# Patient Record
Sex: Male | Born: 1955
Health system: Southern US, Community
[De-identification: ages and names within clinical notes are randomized; demographics above are authoritative.]

## PROBLEM LIST (undated history)

## (undated) DIAGNOSIS — F32A Depression, unspecified: Secondary | ICD-10-CM

## (undated) DIAGNOSIS — J449 Chronic obstructive pulmonary disease, unspecified: Secondary | ICD-10-CM

## (undated) DIAGNOSIS — R51 Headache: Secondary | ICD-10-CM

## (undated) DIAGNOSIS — I639 Cerebral infarction, unspecified: Secondary | ICD-10-CM

## (undated) DIAGNOSIS — F17200 Nicotine dependence, unspecified, uncomplicated: Secondary | ICD-10-CM

## (undated) DIAGNOSIS — R519 Headache, unspecified: Secondary | ICD-10-CM

## (undated) DIAGNOSIS — J302 Other seasonal allergic rhinitis: Secondary | ICD-10-CM

## (undated) DIAGNOSIS — M25562 Pain in left knee: Secondary | ICD-10-CM

## (undated) DIAGNOSIS — N529 Male erectile dysfunction, unspecified: Secondary | ICD-10-CM

## (undated) HISTORY — PX: HERNIA REPAIR: SHX51

## (undated) HISTORY — PX: COLONOSCOPY: SHX174

## (undated) HISTORY — PX: BACK SURGERY: SHX140

---

## 2007-01-25 ENCOUNTER — Ambulatory Visit (HOSPITAL_COMMUNITY): Admission: RE | Admit: 2007-01-25 | Discharge: 2007-01-25 | Payer: Self-pay | Admitting: Neurological Surgery

## 2009-08-20 ENCOUNTER — Inpatient Hospital Stay (HOSPITAL_COMMUNITY): Admission: RE | Admit: 2009-08-20 | Discharge: 2009-08-22 | Payer: Self-pay | Admitting: Neurological Surgery

## 2009-09-15 ENCOUNTER — Encounter: Admission: RE | Admit: 2009-09-15 | Discharge: 2009-09-15 | Payer: Self-pay | Admitting: Neurological Surgery

## 2009-11-11 ENCOUNTER — Encounter: Admission: RE | Admit: 2009-11-11 | Discharge: 2009-11-11 | Payer: Self-pay | Admitting: Neurological Surgery

## 2010-02-09 ENCOUNTER — Encounter: Admission: RE | Admit: 2010-02-09 | Discharge: 2010-02-09 | Payer: Self-pay | Admitting: Neurological Surgery

## 2010-05-12 ENCOUNTER — Encounter: Admission: RE | Admit: 2010-05-12 | Discharge: 2010-05-12 | Payer: Self-pay | Admitting: Neurological Surgery

## 2010-08-17 ENCOUNTER — Encounter
Admission: RE | Admit: 2010-08-17 | Discharge: 2010-08-17 | Payer: Self-pay | Source: Home / Self Care | Attending: Neurological Surgery | Admitting: Neurological Surgery

## 2010-09-13 ENCOUNTER — Emergency Department: Payer: Self-pay | Admitting: Emergency Medicine

## 2010-12-01 ENCOUNTER — Other Ambulatory Visit: Payer: Self-pay | Admitting: Neurological Surgery

## 2010-12-01 ENCOUNTER — Ambulatory Visit
Admission: RE | Admit: 2010-12-01 | Discharge: 2010-12-01 | Disposition: A | Discharge status: Home / Self Care | Payer: Worker's Compensation | Source: Ambulatory Visit | Attending: Neurological Surgery | Admitting: Neurological Surgery

## 2010-12-01 DIAGNOSIS — M545 Low back pain, unspecified: Secondary | ICD-10-CM

## 2010-12-08 LAB — APTT: aPTT: 33 seconds (ref 24–37)

## 2010-12-08 LAB — CBC
HCT: 48.3 % (ref 39.0–52.0)
Hemoglobin: 16.6 g/dL (ref 13.0–17.0)
MCHC: 34.5 g/dL (ref 30.0–36.0)
MCV: 97.3 fL (ref 78.0–100.0)
Platelets: 274 10*3/uL (ref 150–400)
RBC: 4.97 MIL/uL (ref 4.22–5.81)
RDW: 13.2 % (ref 11.5–15.5)
WBC: 10.1 10*3/uL (ref 4.0–10.5)

## 2010-12-08 LAB — DIFFERENTIAL
Basophils Absolute: 0.1 10*3/uL (ref 0.0–0.1)
Basophils Relative: 1 % (ref 0–1)
Eosinophils Absolute: 0.4 10*3/uL (ref 0.0–0.7)
Eosinophils Relative: 4 % (ref 0–5)
Lymphocytes Relative: 34 % (ref 12–46)
Lymphs Abs: 3.4 10*3/uL (ref 0.7–4.0)
Monocytes Absolute: 0.5 10*3/uL (ref 0.1–1.0)
Monocytes Relative: 5 % (ref 3–12)
Neutro Abs: 5.7 10*3/uL (ref 1.7–7.7)
Neutrophils Relative %: 57 % (ref 43–77)

## 2010-12-08 LAB — BASIC METABOLIC PANEL
BUN: 12 mg/dL (ref 6–23)
CO2: 22 mEq/L (ref 19–32)
Calcium: 9.7 mg/dL (ref 8.4–10.5)
Chloride: 101 mEq/L (ref 96–112)
Creatinine, Ser: 0.99 mg/dL (ref 0.4–1.5)
GFR calc Af Amer: >60 mL/min (ref >60)
GFR calc non Af Amer: >60 mL/min (ref >60)
Glucose, Bld: 93 mg/dL (ref 70–99)
Potassium: 4.6 mEq/L (ref 3.5–5.1)
Sodium: 131 mEq/L — ABNORMAL LOW (ref 135–145)

## 2010-12-08 LAB — TYPE AND SCREEN
ABO/RH(D): O POS
Antibody Screen: NEGATIVE

## 2010-12-08 LAB — PROTIME-INR
INR: 0.97 (ref 0.00–1.49)
Prothrombin Time: 12.8 seconds (ref 11.6–15.2)

## 2010-12-08 LAB — ABO/RH: ABO/RH(D): O POS

## 2011-01-19 NOTE — Op Note (Signed)
NAME:  Ethan Fitzgerald, Ethan Fitzgerald                ACCOUNT NO.:  [id number]   MEDICAL RECORD NO.:  [id number]          PATIENT TYPE:  AMB   LOCATION:  SDS                          FACILITY:  [facility]   PHYSICIAN:  Tia Alert, MD     DATE OF BIRTH:  1956-05-02   DATE OF PROCEDURE:  01/25/2007  DATE OF DISCHARGE:                               OPERATIVE REPORT   PREOPERATIVE DIAGNOSIS:  Lumbar disc herniation L5-S1 on the left, with  left S1 radiculopathy.   POSTOPERATIVE DIAGNOSIS:  Lumbar disc herniation L5-S1 on the left, with  left S1 radiculopathy.   PROCEDURES:  Lumbar hemilaminectomy, medial facetectomy, and  foraminotomy L5-S1 on the left, followed by microdiskectomy L5-S1 on the  left, utilizing microscopic dissection.   SURGEON:  Tia Alert, M.D.   ASSISTANT:  Donalee Citrin, M.D.   ANESTHESIA:  General endotracheal.   COMPLICATIONS:  None apparent.   INDICATIONS FOR THE PROCEDURE:  Mr. Eisen is a 55 year old gentleman who  was referred with severe left leg pain.  He had an MRI which showed a  very large disc herniation at L5-S1 on the left, with a large free  fragment. He had tried medical management for quite some time, without  significant relief.  I recommended a microdiskectomy at L5-S1 on the  left. He understood the risks, benefits, and expected outcome, and  wished to proceed.   DESCRIPTION OF THE PROCEDURE:  The patient was taken to operating room,  and after induction of adequate generalized endotracheal anesthesia he  was rolled into the prone position on the Wilson frame, and all pressure  points were padded.  His lumbar region was prepped with DuraPrep and  then draped in the usual sterile fashion.  4 cc of local anesthesia was  injected, and a small dorsal midline incision was made and carried down  to the lumbosacral fascia.  The fascia was opened, and the paraspinous  musculature was taken down in a subperiosteal fashion to expose L5-S1 on  the left side.   Intraoperative x-ray confirmed my level, and then the  high-speed drill the Kerrison punches were used to perform a  hemilaminectomy, medial facetectomy, and foraminotomy at L5-S1 on the  left side.  The underlying yellow ligament was identified, opened, and  removed in a piecemeal fashion to expose the underlying dura and S1  nerve root.  The S1 nerve root was tented up over a large disc  herniation.  This was opened with a 4 Penfield and then removed with a  nerve hook and a pituitary rongeur.  I then incised the disc space with  a 15 blade scalpel and performed a thorough intradiskal diskectomy  utilizing the pituitary rongeurs.  Once the diskectomy was complete, I  palpated with a nerve hook and a coronary dilator into the foramen and  into the midline.  I felt no more compression on the nerve root or any  more free fragments. The nerve root was free and pulsatile.  I then  irrigated with saline solution containing bacitracin, dried all bleeding  points were bipolar cautery, and lined  the dura with Duragen to help  prevent epidural scarring, and then removed the retractor, inspected  once again, and then closed the fascia with 0 Vicryl, closed the  subcutaneous and subcuticular tissues with 2-0 and 3-0 Vicryl, and  closed  skin with Benzoin and Steri-Strips.  The drapes removed.  A sterile  dressing was applied.  The patient was awakened from general anesthesia  and transferred to the recovery room in stable condition.  At the end of  this procedure, all sponge, needle, and instrument counts were correct.      Tia Alert, MD  Electronically Signed     DSJ/MEDQ  D:  01/25/2007  T:  01/25/2007  Job:  [id number]

## 2011-03-02 ENCOUNTER — Other Ambulatory Visit: Payer: Self-pay | Admitting: Neurological Surgery

## 2011-03-02 ENCOUNTER — Ambulatory Visit
Admission: RE | Admit: 2011-03-02 | Discharge: 2011-03-02 | Disposition: A | Discharge status: Home / Self Care | Payer: Worker's Compensation | Source: Ambulatory Visit | Attending: Neurological Surgery | Admitting: Neurological Surgery

## 2011-03-02 DIAGNOSIS — M545 Low back pain, unspecified: Secondary | ICD-10-CM

## 2013-09-23 ENCOUNTER — Ambulatory Visit: Payer: Self-pay | Admitting: Family Medicine

## 2014-02-20 LAB — HM COLONOSCOPY

## 2015-02-20 ENCOUNTER — Other Ambulatory Visit: Payer: Self-pay | Admitting: Orthopedic Surgery

## 2015-02-20 DIAGNOSIS — M25562 Pain in left knee: Secondary | ICD-10-CM

## 2015-02-20 DIAGNOSIS — M2392 Unspecified internal derangement of left knee: Secondary | ICD-10-CM

## 2015-02-20 DIAGNOSIS — G8929 Other chronic pain: Secondary | ICD-10-CM

## 2015-02-26 ENCOUNTER — Ambulatory Visit
Admission: RE | Admit: 2015-02-26 | Discharge: 2015-02-26 | Disposition: A | Discharge status: Home / Self Care | Payer: No Typology Code available for payment source | Source: Ambulatory Visit | Attending: Orthopedic Surgery | Admitting: Orthopedic Surgery

## 2015-02-26 DIAGNOSIS — X58XXXA Exposure to other specified factors, initial encounter: Secondary | ICD-10-CM | POA: Insufficient documentation

## 2015-02-26 DIAGNOSIS — S83232A Complex tear of medial meniscus, current injury, left knee, initial encounter: Secondary | ICD-10-CM | POA: Diagnosis not present

## 2015-02-26 DIAGNOSIS — M2392 Unspecified internal derangement of left knee: Secondary | ICD-10-CM

## 2015-02-26 DIAGNOSIS — M25462 Effusion, left knee: Secondary | ICD-10-CM | POA: Insufficient documentation

## 2015-02-26 DIAGNOSIS — M25562 Pain in left knee: Secondary | ICD-10-CM

## 2015-02-26 DIAGNOSIS — G8929 Other chronic pain: Secondary | ICD-10-CM

## 2015-03-06 DIAGNOSIS — S83209A Unspecified tear of unspecified meniscus, current injury, unspecified knee, initial encounter: Secondary | ICD-10-CM | POA: Insufficient documentation

## 2015-03-28 ENCOUNTER — Ambulatory Visit
Admission: RE | Admit: 2015-03-28 | Discharge: 2015-03-28 | Disposition: A | Discharge status: Home / Self Care | Payer: No Typology Code available for payment source | Source: Ambulatory Visit | Attending: Unknown Physician Specialty | Admitting: Unknown Physician Specialty

## 2015-03-28 ENCOUNTER — Encounter: Payer: Self-pay | Admitting: Anesthesiology

## 2015-03-28 ENCOUNTER — Encounter
Admission: RE | Disposition: A | Discharge status: Home / Self Care | Payer: Self-pay | Source: Ambulatory Visit | Attending: Unknown Physician Specialty

## 2015-03-28 ENCOUNTER — Ambulatory Visit: Payer: No Typology Code available for payment source | Admitting: Anesthesiology

## 2015-03-28 DIAGNOSIS — Z825 Family history of asthma and other chronic lower respiratory diseases: Secondary | ICD-10-CM | POA: Insufficient documentation

## 2015-03-28 DIAGNOSIS — Z79899 Other long term (current) drug therapy: Secondary | ICD-10-CM | POA: Diagnosis not present

## 2015-03-28 DIAGNOSIS — E781 Pure hyperglyceridemia: Secondary | ICD-10-CM | POA: Insufficient documentation

## 2015-03-28 DIAGNOSIS — S83242A Other tear of medial meniscus, current injury, left knee, initial encounter: Secondary | ICD-10-CM | POA: Insufficient documentation

## 2015-03-28 DIAGNOSIS — X58XXXA Exposure to other specified factors, initial encounter: Secondary | ICD-10-CM | POA: Diagnosis not present

## 2015-03-28 DIAGNOSIS — R51 Headache: Secondary | ICD-10-CM | POA: Insufficient documentation

## 2015-03-28 DIAGNOSIS — F17200 Nicotine dependence, unspecified, uncomplicated: Secondary | ICD-10-CM | POA: Insufficient documentation

## 2015-03-28 DIAGNOSIS — Z8249 Family history of ischemic heart disease and other diseases of the circulatory system: Secondary | ICD-10-CM | POA: Insufficient documentation

## 2015-03-28 DIAGNOSIS — M25562 Pain in left knee: Secondary | ICD-10-CM | POA: Diagnosis present

## 2015-03-28 HISTORY — DX: Headache: R51

## 2015-03-28 HISTORY — DX: Headache, unspecified: R51.9

## 2015-03-28 HISTORY — DX: Male erectile dysfunction, unspecified: N52.9

## 2015-03-28 HISTORY — PX: KNEE ARTHROSCOPY: SHX127

## 2015-03-28 HISTORY — DX: Pain in left knee: M25.562

## 2015-03-28 HISTORY — DX: Nicotine dependence, unspecified, uncomplicated: F17.200

## 2015-03-28 HISTORY — DX: Other seasonal allergic rhinitis: J30.2

## 2015-03-28 SURGERY — ARTHROSCOPY, KNEE
Anesthesia: General | Laterality: Left | Wound class: Clean

## 2015-03-28 MED ORDER — DEXAMETHASONE SODIUM PHOSPHATE 4 MG/ML IJ SOLN
INTRAMUSCULAR | Status: DC | PRN
  Administered 2015-03-28: 4 mg via INTRAVENOUS

## 2015-03-28 MED ORDER — OXYCODONE HCL 5 MG/5ML PO SOLN: 5.0000 mg | Freq: Once | ORAL | Status: DC | PRN

## 2015-03-28 MED ORDER — ONDANSETRON HCL 4 MG/2ML IJ SOLN
INTRAMUSCULAR | Status: DC | PRN
  Administered 2015-03-28: 4 mg via INTRAVENOUS

## 2015-03-28 MED ORDER — LIDOCAINE HCL (CARDIAC) 20 MG/ML IV SOLN
INTRAVENOUS | Status: DC | PRN
  Administered 2015-03-28: 30 mg via INTRATRACHEAL

## 2015-03-28 MED ORDER — LACTATED RINGERS IV SOLN
INTRAVENOUS | Status: DC
  Administered 2015-03-28 (×2): via INTRAVENOUS

## 2015-03-28 MED ORDER — GLYCOPYRROLATE 0.2 MG/ML IJ SOLN
INTRAMUSCULAR | Status: DC | PRN
  Administered 2015-03-28: 0.1 mg via INTRAVENOUS

## 2015-03-28 MED ORDER — OXYCODONE HCL 5 MG PO TABS: 5.0000 mg | ORAL_TABLET | Freq: Once | ORAL | Status: DC | PRN

## 2015-03-28 MED ORDER — FENTANYL CITRATE (PF) 100 MCG/2ML IJ SOLN
INTRAMUSCULAR | Status: DC | PRN
  Administered 2015-03-28 (×2): 12.5 ug via INTRAVENOUS
  Administered 2015-03-28 (×3): 25 ug via INTRAVENOUS

## 2015-03-28 MED ORDER — MIDAZOLAM HCL 5 MG/5ML IJ SOLN
INTRAMUSCULAR | Status: DC | PRN
  Administered 2015-03-28: 2 mg via INTRAVENOUS

## 2015-03-28 MED ORDER — NORCO 5-325 MG PO TABS: 1.0000 | ORAL_TABLET | ORAL | Status: DC | PRN

## 2015-03-28 MED ORDER — EPHEDRINE SULFATE 50 MG/ML IJ SOLN
INTRAMUSCULAR | Status: DC | PRN
  Administered 2015-03-28 (×4): 5 mg via INTRAVENOUS

## 2015-03-28 MED ORDER — PROPOFOL 10 MG/ML IV BOLUS
INTRAVENOUS | Status: DC | PRN
  Administered 2015-03-28: 170 mg via INTRAVENOUS

## 2015-03-28 MED ORDER — BUPIVACAINE HCL (PF) 0.5 % IJ SOLN
INTRAMUSCULAR | Status: DC | PRN
  Administered 2015-03-28: 20 mL

## 2015-03-28 SURGICAL SUPPLY — 38 items
116880 ×2 IMPLANT
ARTHROWAND PARAGON T2 (SURGICAL WAND) ×2
BLADE SHAVER 4.5X7 STR FR (MISCELLANEOUS) ×2 IMPLANT
BUR RADIUS 3.5 (BURR) IMPLANT
BUR RADIUS 4.0X18.5 (BURR) IMPLANT
BUR ROUND 5.5 (BURR) IMPLANT
BURR ROUND 12 FLUTE 4.0MM (BURR) IMPLANT
CUFF TOURN SGL QUICK 24 (TOURNIQUET CUFF)
CUFF TOURN SGL QUICK 30 (MISCELLANEOUS) ×1
CUFF TOURN SGL QUICK 34 (TOURNIQUET CUFF)
CUFF TRNQT CYL 24X4X40X1 (TOURNIQUET CUFF) IMPLANT
CUFF TRNQT CYL 34X4X40X1 (TOURNIQUET CUFF) IMPLANT
CUFF TRNQT CYL LO 30X4X (MISCELLANEOUS) ×1 IMPLANT
CUTTER SLOTTED WHISKER 4.0 (BURR) IMPLANT
DRAPE LEGGINS SURG 28X43 STRL (DRAPES) ×2 IMPLANT
DURAPREP 26ML APPLICATOR (WOUND CARE) ×2 IMPLANT
GAUZE SPONGE 4X4 12PLY STRL (GAUZE/BANDAGES/DRESSINGS) ×2 IMPLANT
GLOVE BIO SURGEON STRL SZ7.5 (GLOVE) ×2 IMPLANT
GLOVE BIO SURGEON STRL SZ8 (GLOVE) ×2 IMPLANT
GLOVE INDICATOR 8.0 STRL GRN (GLOVE) ×2 IMPLANT
GOWN STRL REIN 2XL XLG LVL4 (GOWN DISPOSABLE) ×2 IMPLANT
GOWN STRL REUS W/TWL 2XL LVL3 (GOWN DISPOSABLE) ×2 IMPLANT
IV LACTATED RINGER IRRG 3000ML (IV SOLUTION) ×3
IV LR IRRIG 3000ML ARTHROMATIC (IV SOLUTION) ×3 IMPLANT
MANIFOLD 4PT FOR NEPTUNE1 (MISCELLANEOUS) ×2 IMPLANT
PACK ARTHROSCOPY KNEE (MISCELLANEOUS) ×2 IMPLANT
SET TUBE SUCT SHAVER OUTFL 24K (TUBING) ×2 IMPLANT
SUT ETHILON 3-0 FS-10 30 BLK (SUTURE) ×2
SUTURE EHLN 3-0 FS-10 30 BLK (SUTURE) ×1 IMPLANT
TAPE MICROFOAM 4IN (TAPE) ×2 IMPLANT
TUBING ARTHRO INFLOW-ONLY STRL (TUBING) ×2 IMPLANT
WAND ARTHRO PARAGON T2 (SURGICAL WAND) ×1 IMPLANT
WAND COVAC 50 IFS (MISCELLANEOUS) IMPLANT
WAND HAND CNTRL MULTIVAC 50 (MISCELLANEOUS) IMPLANT
WAND HAND CNTRL MULTIVAC 90 (MISCELLANEOUS) IMPLANT
WAND MEGAVAC 90 (MISCELLANEOUS) IMPLANT
WAND ULTRAVAC 90 (MISCELLANEOUS) IMPLANT
WRAP KNEE W/COLD PACKS 25.5X14 (SOFTGOODS) ×2 IMPLANT

## 2015-03-28 NOTE — H&P (Signed)
  H and P reviewed. No changes. Uploaded at later date. 

## 2015-03-28 NOTE — Discharge Instructions (Signed)
General Anesthesia, Care After Refer to this sheet in the next few weeks. These instructions provide you with information on caring for yourself after your procedure. Your health care provider may also give you more specific instructions. Your treatment has been planned according to current medical practices, but problems sometimes occur. Call your health care provider if you have any problems or questions after your procedure. WHAT TO EXPECT AFTER THE PROCEDURE After the procedure, it is typical to experience:  Sleepiness.  Nausea and vomiting. HOME CARE INSTRUCTIONS  For the first 24 hours after general anesthesia:  Have a responsible person with you.  Do not drive a car. If you are alone, do not take public transportation.  Do not drink alcohol.  Do not take medicine that has not been prescribed by your health care provider.  Do not sign important papers or make important decisions.  You may resume a normal diet and activities as directed by your health care provider.  Change bandages (dressings) as directed.  If you have questions or problems that seem related to general anesthesia, call the hospital and ask for the anesthetist or anesthesiologist on call. SEEK MEDICAL CARE IF:  You have nausea and vomiting that continue the day after anesthesia.  You develop a rash. SEEK IMMEDIATE MEDICAL CARE IF:   You have difficulty breathing.  You have chest pain.  You have any allergic problems. Document Released: 11/29/2000 Document Revised: 08/28/2013 Document Reviewed: 03/08/2013 [facility] Patient Information 2015 [place], [place]. This information is not intended to replace advice given to you by your health care provider. Make sure you discuss any questions you have with your health care provider.    [facility]  Kathrene Alu., M.D. [phone]   KNEE ARTHROSCOPY POST OPERATION INSTRUCTIONS:  PLEASE READ THESE INSTRUCTIONS  ABOUT POST OPERATION CARE. THEY WILL ANSWER MOST OF YOUR QUESTIONS.  You have been given a prescription for pain. Please take as directed for pain.  You can walk, keeping the knee slightly stiff-avoid doing too much bending the first day. (if ACL reconstruction is performed, keep brace locked in extension when walking.)  You will use crutches or cane if needed. Can weight bear as tolerated  Plan to take three to four days off from work. You can resume work when you are comfortable. (This can be a week or more, depending on the type of work you do.)  To reduce pain and swelling, place one to two pillows under the knee the first two or three days when sitting or lying. An ice pack may be placed on top of the area over the dressing. Instructions for making homemade icepack are as follow:  Flexible homemade alcohol water ice pack  2 cups water  1 cup rubbing alcohol  food coloring for the blue tint (optional)  2 zip-top bags - gallon-size  Mix the water and alcohol together in one of your zip-top bags and add food coloring. Release as much air as possible and seal the bag. Place in freezer for at least 12 hours.  The small incisions in your knee are closed with nylon stitches. They will be removed in the office.  The bulky dressing may be removed in the third day after surgery. (If ACL surgery-DO NOT REMOVE BANDAGES). Put a waterproof band-aid over each stitch. Do not put any creams or ointments on wounds. You may shower at this time, but change waterproof band-aids after showering. KEEP INCISIONS CLEAN AND DRY UNTIL YOU RETURN  TO THE OFFICE.  Sometimes the operative area remains somewhat painful and swollen for several weeks. This is usually nothing to worry about, but call if you have any excessive symptoms, especially fever. It is not unusual to have a low grade fever of 99 degrees for the first few days. If persist after 3-4 days call the office. It is not uncommon for the pain to be a little worse on  the third day after surgery.  Begin doing gentle exercises right away. They will be limited by the amount of pain and swelling you have.  Exercising will reduce the swelling, increase motion, and prevent muscle weakness. Exercises: Straight leg raising and gentle knee bending.  Take 81 milligram aspirin twice a day for 2 weeks after meals or milk. This along with elevation will help reduce the possibility of phlebitis in your operated leg.  Avoid strenuous athletics for a minimum of 4 to 6 weeks after arthroscopic surgery (approximately five months if ACL surgery).  If the surgery included ACL reconstruction the brace that is supplied to the extremity post surgery is to be locked in extension when you are asleep and is to be locked in extension when you are ambulating. It can be unlocked for exercises or sitting.  Keep your post surgery appointment that has been made for you. If you do not remember the date call [phone]. Your follow up appointment should be between 7-10 days.

## 2015-03-28 NOTE — Anesthesia Preprocedure Evaluation (Addendum)
Anesthesia Evaluation  Patient identified by MRN, date of birth, ID band  Reviewed: NPO status   History of Anesthesia Complications Negative for: history of anesthetic complications  Airway Mallampati: II  TM Distance: >3 FB Neck ROM: full    Dental  (+) Edentulous Upper, Edentulous Lower   Pulmonary Current Smoker,    Pulmonary exam normal       Cardiovascular Exercise Tolerance: Good negative cardio ROS Normal cardiovascular exam    Neuro/Psych  Headaches, negative psych ROS   GI/Hepatic negative GI ROS, Neg liver ROS,   Endo/Other  neg diabetes (denies)  Renal/GU negative Renal ROS  negative genitourinary   Musculoskeletal   Abdominal   Peds  Hematology negative hematology ROS (+)   Anesthesia Other Findings   Reproductive/Obstetrics                            Anesthesia Physical Anesthesia Plan  ASA: II  Anesthesia Plan: General   Post-op Pain Management:    Induction:   Airway Management Planned:   Additional Equipment:   Intra-op Plan:   Post-operative Plan:   Informed Consent: I have reviewed the patients History and Physical, chart, labs and discussed the procedure including the risks, benefits and alternatives for the proposed anesthesia with the patient or authorized representative who has indicated his/her understanding and acceptance.     Plan Discussed with: CRNA  Anesthesia Plan Comments:         Anesthesia Quick Evaluation

## 2015-03-28 NOTE — Transfer of Care (Signed)
Immediate Anesthesia Transfer of Care Note  Patient: Ethan Fitzgerald  Procedure(s) Performed: Procedure(s): ARTHROSCOPY KNEE (Left)  Patient Location: PACU  Anesthesia Type: General  Level of Consciousness: awake, alert  and patient cooperative  Airway and Oxygen Therapy: Patient Spontanous Breathing and Patient connected to supplemental oxygen  Post-op Assessment: Post-op Vital signs reviewed, Patient's Cardiovascular Status Stable, Respiratory Function Stable, Patent Airway and No signs of Nausea or vomiting  Post-op Vital Signs: Reviewed and stable  Complications: No apparent anesthesia complications

## 2015-03-28 NOTE — Anesthesia Postprocedure Evaluation (Signed)
  Anesthesia Post-op Note  Patient: Ethan Fitzgerald  Procedure(s) Performed: Procedure(s): Arthroscopic partial medial meniscectomy plus chondral debridement (Left)  Anesthesia type:General  Patient location: PACU  Post pain: Pain level controlled  Post assessment: Post-op Vital signs reviewed, Patient's Cardiovascular Status Stable, Respiratory Function Stable, Patent Airway and No signs of Nausea or vomiting  Post vital signs: Reviewed and stable  Last Vitals:  Filed Vitals:   03/28/15 1109  BP: 110/83  Pulse: 85  Temp:   Resp: 17    Level of consciousness: awake, alert  and patient cooperative  Complications: No apparent anesthesia complications

## 2015-03-28 NOTE — Anesthesia Procedure Notes (Signed)
Procedure Name: LMA Insertion Date/Time: 03/28/2015 9:27 AM Performed by: Cameron Ali Pre-anesthesia Checklist: Patient identified, Emergency Drugs available, Suction available, Timeout performed and Patient being monitored Patient Re-evaluated:Patient Re-evaluated prior to inductionOxygen Delivery Method: Circle system utilized Preoxygenation: Pre-oxygenation with 100% oxygen Intubation Type: IV induction LMA: LMA inserted LMA Size: 4.0 Number of attempts: 1 Placement Confirmation: positive ETCO2 and breath sounds checked- equal and bilateral Tube secured with: Tape Dental Injury: Teeth and Oropharynx as per pre-operative assessment

## 2015-03-28 NOTE — Op Note (Signed)
Preoperative diagnosis: Torn medial meniscus left knee plus possible chondral lesions  Postop diagnosis: Torn medial meniscus with multiple chondral lesions  Operation: Arthroscopic partial medial meniscectomy plus chondral debridement  Surgeon: Vilinda Flake, MD  Anesthesia: Gen.   History: Patient's had a long history of left knee pain.  The plain films revealed no significant joint space narrowing .  The patient had an MRI which revealed a torn posterior horn of the medial meniscus with a medial meniscus fragment displaced along the medial femoral condyle. Only mild medial compartment chondral changes were noted..The patient was scheduled for surgery due to persistent discomfort despite conservative treatment.  The patient was taken the operating room where satisfactory general anesthesia was achieved. A tourniquet and leg holder were was applied to the left thigh. The left lower extremity was supported with a well leg holder. The left knee was prepped and draped in usual fashion for an arthroscopic procedure. An inflow cannula was introduced superomedially. The joint was distended with lactated Ringer's. Scope was introduced through an inferolateral puncture wound and a probe through an inferomedial puncture wound. Inspection of the medial compartment revealed  a posterior horn tear of the medial meniscus with a displaced fragment. There were some grade 2-3 chondral changes in the mid weightbearing portion of the medial femoral condyle. I went ahead and used arthroscopic scissors to partially divide the remaining attachment of the displaced fragment. I was then able to avulse the fragment from the medial compartment with a grasper. I then used a turbo whisker to debride the grade 2 and 3 chondral changes in the mid weightbearing portion of medial femoral condyle I then coblated the more significant chondral lesions with an ArthroCare Paragon wand. Inspection of the intercondylar notch revealed  intact cruciates. Inspection of the the lateral compartment revealed smooth articular surfaces and a normal lateral meniscus. Trochlear groove was inspected and appeared to be fairly smooth.  Patella surface was moderately fibrillated. I debrided the retropatellar surface with a turbo whisker and then coblated the retropatellar lesions with an ArthroCare Paragon wand. I observed patella tracking from the superomedial portal. The the patella seemed to track fairly well.  The instruments were removed from the joint at this time. The puncture wounds were closed with 3-0 nylon in vertical mattress fashion. I injected each puncture wound with several cc of half percent Marcaine without epinephrine. Betadine was applied the wounds followed by sterile dressing. An ice pack was applied to the right knee. The patient was awakened and transferred to the stretcher bed. The patient was taken to the recovery room in satisfactory condition.  The tourniquet was not inflated during the course of the procedure. Blood loss was negligible.

## 2015-04-02 ENCOUNTER — Encounter: Payer: Self-pay | Admitting: Unknown Physician Specialty

## 2017-02-18 ENCOUNTER — Ambulatory Visit (INDEPENDENT_AMBULATORY_CARE_PROVIDER_SITE_OTHER): Payer: BLUE CROSS/BLUE SHIELD | Admitting: Family Medicine

## 2017-02-18 ENCOUNTER — Encounter: Payer: Self-pay | Admitting: Family Medicine

## 2017-02-18 VITALS — BP 129/79 | HR 108 | Temp 98.2°F | Resp 16 | Ht 72.0 in | Wt 188.0 lb

## 2017-02-18 DIAGNOSIS — N529 Male erectile dysfunction, unspecified: Secondary | ICD-10-CM | POA: Diagnosis not present

## 2017-02-18 DIAGNOSIS — M545 Low back pain: Secondary | ICD-10-CM

## 2017-02-18 DIAGNOSIS — Z87891 Personal history of nicotine dependence: Secondary | ICD-10-CM | POA: Insufficient documentation

## 2017-02-18 DIAGNOSIS — Z7689 Persons encountering health services in other specified circumstances: Secondary | ICD-10-CM | POA: Diagnosis not present

## 2017-02-18 DIAGNOSIS — S70362A Insect bite (nonvenomous), left thigh, initial encounter: Secondary | ICD-10-CM | POA: Diagnosis not present

## 2017-02-18 DIAGNOSIS — Z720 Tobacco use: Secondary | ICD-10-CM

## 2017-02-18 DIAGNOSIS — G8929 Other chronic pain: Secondary | ICD-10-CM | POA: Insufficient documentation

## 2017-02-18 DIAGNOSIS — M25562 Pain in left knee: Secondary | ICD-10-CM

## 2017-02-18 DIAGNOSIS — R7309 Other abnormal glucose: Secondary | ICD-10-CM | POA: Insufficient documentation

## 2017-02-18 DIAGNOSIS — W57XXXA Bitten or stung by nonvenomous insect and other nonvenomous arthropods, initial encounter: Secondary | ICD-10-CM | POA: Diagnosis not present

## 2017-02-18 DIAGNOSIS — E785 Hyperlipidemia, unspecified: Secondary | ICD-10-CM | POA: Insufficient documentation

## 2017-02-18 DIAGNOSIS — J3089 Other allergic rhinitis: Secondary | ICD-10-CM

## 2017-02-18 DIAGNOSIS — Z8719 Personal history of other diseases of the digestive system: Secondary | ICD-10-CM | POA: Diagnosis not present

## 2017-02-18 DIAGNOSIS — J302 Other seasonal allergic rhinitis: Secondary | ICD-10-CM | POA: Insufficient documentation

## 2017-02-18 DIAGNOSIS — E782 Mixed hyperlipidemia: Secondary | ICD-10-CM

## 2017-02-18 DIAGNOSIS — R7303 Prediabetes: Secondary | ICD-10-CM | POA: Diagnosis not present

## 2017-02-18 MED ORDER — DOXYCYCLINE HYCLATE 100 MG PO TABS
100.0000 mg | ORAL_TABLET | Freq: Two times a day (BID) | ORAL | 0 refills | Status: DC
Start: 2017-02-18 — End: 2017-03-18

## 2017-02-18 NOTE — Patient Instructions (Signed)
Thank you for coming to the clinic today.  1. For Tick Bite, start Doxycycline antibiotic twice daily for 2 weeks - the rash seems more like a local reaction to the tick bite, and maybe superficial skin infection but not consistent with Erythema Migrans or Target Rash we can see in Lyme Disease. - I am not as concerned about these serious tickborne illnesses, but you are covered on this antibiotic - Concerning symptoms to look out for are severe headaches, fevers/chills, sweats, nausea, vomiting, can notify office or seek more immediate medical attention at hospital  2. You will be due for FASTING BLOOD WORK (no food or drink after midnight before, only water or coffee without cream/sugar on the morning of)  - Please go ahead and schedule a "Lab Only" visit in the morning at the clinic for lab draw in 2 weeks  - Make sure Lab Only appointment is at least 1-2 weeks before your next appointment, so that results will be available  For Lab Results, once available within 2-3 days of blood draw, you can can log in to MyChart online to view your results and a brief explanation. Also, we can discuss results at next follow-up visit.  Please schedule a Follow-up Appointment to: Return in about 2 weeks (around 03/04/2017) for Annual Physical.  If you have any other questions or concerns, please feel free to call the clinic or send a message through [place]. You may also schedule an earlier appointment if necessary.  Additionally, you may be receiving a survey about your experience at our clinic within a few days to 1 week by e-mail or mail. We value your feedback.  Ethan Putnam, DO [place]

## 2017-02-18 NOTE — Progress Notes (Signed)
Subjective:    [name], male    DOB: September 21, 1955, 61 y.o.   MRN: [id number]  Ethan Fitzgerald is a 61 y.o. male presenting on 02/18/2017 for Tick Removal  Previously followed by PCP at [facility] until 2 years ago, has not re-established since, now here for new PCP.  HPI   TICK BITE, Left inner thigh: - Reports new concern with recent tick bite, describes unsure exact time he got tick but was out in yard over weekend, believes he first noticed it Sunday x 5 days ago, and he noticed later red rash and irritated bump on left inner thigh near groin, then it seemed to changed 2-3 days later he saw a "gray tab" and there was more redness, the gray part fell off and it was actually a tick that had gorged, thinks attached for >72 hours - Today he is asking about treatment, his family requested that he see doctor - No prior known history of tick borne illness - Denies any headache, fever/chills, sweats, joint pain or aches, other rash, spreading redness, drainage of pus  Pre-Diabetes: Reports history of "borderline diabetes" with elevated sugar. No results available currently, does not recall last A1c Does not check CBG. Meds: none currently, previously 3-4 years ago on Metformin for 6 months then problem "resolved" Lifestyle: - weight gain +10-15 lbs, concern with beer intake - Diet: Tries to improve diet, admits to taking fruit / vegetable supplement 2-3 AM/afternoon for 2 months, seems to regulate bowel movements, also hemorrhoids much improved - Mother passed from MI at age 46, Diabetes. Denies hypoglycemia, polyuria, visual changes, numbness or tingling.  History of ED: - Prior chronic history of ED for past 5-6 years, attributed to age and other factors. Prior PCP tried Cialis in past, no longer on meds.  HYPERLIPIDEMIA: - Reports no current concerns states he had cholesterol checked few years ago only abnormal TG but chart review shows mild low HDL as well -  Currently not taking any cholesterol meds  Chronic Back Pain - History of chronic back pain and other joint pain with OA/DJD, previously followed by [facility], does admit to prior trial on hydrocodone, no longer, last 2016 - Previously had some L knee pain and meniscus injury, s/p arthroscopy - Takes rarely NSAIDs, now uses Excedrin PRN headaches  Seasonal Allergies: - Reports chronic problem since living in [place] for past 20 years, previously lived in [place] before - Takes OTC Allergy med PRN, worst seasons Spring and Fall  Past Medical History:  Diagnosis Date  . Erectile dysfunction   . Headache    daily, stress headaches  . Knee pain, left   . Seasonal allergies   . Tobacco dependence    Past Surgical History:  Procedure Laterality Date  . BACK SURGERY     metal plate in back  . COLONOSCOPY    . HERNIA REPAIR Right   . KNEE ARTHROSCOPY Left 03/28/2015   Procedure: Arthroscopic partial medial meniscectomy plus chondral debridement;  Surgeon: Leanor Kail, MD;  Location: [facility];  Service: Orthopedics;  Laterality: Left;   Social History   Social History  . Marital status: Married    Spouse name: N/A  . Number of children: N/A  . Years of education: N/A   Occupational History  . Not on file.   Social History Main Topics  . Smoking status: Current Every Day Smoker    Packs/day: 0.25    Years: 40.00  Types: Cigarettes  . Smokeless tobacco: Current User  . Alcohol use Yes     Comment: 6 pack on weekend  . Drug use: No  . Sexual activity: Not on file   Other Topics Concern  . Not on file   Social History Narrative  . No narrative on file   Family History  Problem Relation Age of Onset  . Diabetes Mother   . Heart attack Mother 1  . Cancer Father        liver   . COPD Brother   . HIV/AIDS Brother    Current Outpatient Prescriptions on File Prior to Visit  Medication Sig  . Aspirin-Acetaminophen-Caffeine (EXCEDRIN PO) Take 2 tablets by  mouth. am  . vitamin C (ASCORBIC ACID) 500 MG tablet Take 500 mg by mouth daily. Am   No current facility-administered medications on file prior to visit.     Review of Systems  Constitutional: Negative for activity change, appetite change, chills, diaphoresis, fatigue, fever and unexpected weight change.  HENT: Negative for congestion, hearing loss and sinus pressure.   Eyes: Negative for visual disturbance.  Respiratory: Negative for apnea, cough, chest tightness, shortness of breath and wheezing.   Cardiovascular: Negative for chest pain, palpitations and leg swelling.  Gastrointestinal: Negative for abdominal pain, constipation, diarrhea, nausea and vomiting.  Endocrine: Negative for cold intolerance and polyuria.  Genitourinary: Negative for difficulty urinating, dysuria, frequency and hematuria.  Musculoskeletal: Negative for arthralgias, back pain and neck pain.  Skin: Positive for rash.  Allergic/Immunologic: Negative for environmental allergies.  Neurological: Negative for dizziness, weakness, light-headedness, numbness and headaches.  Hematological: Negative for adenopathy.  Psychiatric/Behavioral: Negative for behavioral problems, dysphoric mood and sleep disturbance. The patient is not nervous/anxious.    Per HPI unless specifically indicated above     Objective:    BP 129/79   Pulse (!) 108   Temp 98.2 F (36.8 C) (Oral)   Resp 16   Ht 6' (1.829 m)   Wt 188 lb (85.3 kg)   BMI 25.50 kg/m   Wt Readings from Last 3 Encounters:  02/18/17 188 lb (85.3 kg)  03/28/15 191 lb (86.6 kg)  02/26/15 195 lb (88.5 kg)    Physical Exam  Constitutional: He is oriented to person, place, and time. He appears well-developed and well-nourished. No distress.  Well-appearing, comfortable, cooperative  HENT:  Head: Normocephalic and atraumatic.  Mouth/Throat: Oropharynx is clear and moist.  Eyes: Conjunctivae are normal. Right eye exhibits no discharge. Left eye exhibits no  discharge.  Neck: Normal range of motion. Neck supple.  Cardiovascular: Normal rate, regular rhythm and intact distal pulses.   No murmur heard. Pulmonary/Chest: Effort normal and breath sounds normal. No respiratory distress. He has no wheezes. He has no rales.  Musculoskeletal: He exhibits no edema.  Neurological: He is alert and oriented to person, place, and time.  Skin: Skin is warm and dry. Rash (Localized to Left inner thigh at site of tick bite, see picutre, not consistent with erythema migrans) noted. He is not diaphoretic. There is erythema.  Psychiatric: He has a normal mood and affect. His behavior is normal.  Well groomed, good eye contact, normal speech and thoughts   Nursing note and vitals reviewed.    Left inner thigh     Results for orders placed or performed during the hospital encounter of 19/14/78  Basic metabolic panel  Result Value Ref Range   Sodium 131 (L) 135 - 145 mEq/L   Potassium 4.6 3.5 -  5.1 mEq/L   Chloride 101 96 - 112 mEq/L   CO2 22 19 - 32 mEq/L   Glucose, Bld 93 70 - 99 mg/dL   BUN 12 6 - 23 mg/dL   Creatinine, Ser 0.99 0.4 - 1.5 mg/dL   Calcium 9.7 8.4 - 10.5 mg/dL   GFR calc non Af Amer >60 >60 mL/min   GFR calc Af Amer  >60 mL/min    >60        The eGFR has been calculated using the MDRD equation. This calculation has not been validated in all clinical situations. eGFR's persistently <60 mL/min signify possible Chronic Kidney Disease.  CBC  Result Value Ref Range   WBC 10.1 4.0 - 10.5 K/uL   RBC 4.97 4.22 - 5.81 MIL/uL   Hemoglobin 16.6 13.0 - 17.0 g/dL   HCT 48.3 39.0 - 52.0 %   MCV 97.3 78.0 - 100.0 fL   MCHC 34.5 30.0 - 36.0 g/dL   RDW 13.2 11.5 - 15.5 %   Platelets 274 150 - 400 K/uL  Differential  Result Value Ref Range   Neutrophils Relative % 57 43 - 77 %   Neutro Abs 5.7 1.7 - 7.7 K/uL   Lymphocytes Relative 34 12 - 46 %   Lymphs Abs 3.4 0.7 - 4.0 K/uL   Monocytes Relative 5 3 - 12 %   Monocytes Absolute 0.5 0.1  - 1.0 K/uL   Eosinophils Relative 4 0 - 5 %   Eosinophils Absolute 0.4 0.0 - 0.7 K/uL   Basophils Relative 1 0 - 1 %   Basophils Absolute 0.1 0.0 - 0.1 K/uL  Protime-INR  Result Value Ref Range   Prothrombin Time 12.8 11.6 - 15.2 seconds   INR 0.97 0.00 - 1.49  APTT  Result Value Ref Range   aPTT 33 24 - 37 seconds  Type and screen  Result Value Ref Range   ABO/RH(D) O POS    Antibody Screen NEG    Sample Expiration 08/22/2009   ABO/Rh  Result Value Ref Range   ABO/RH(D) O POS       Assessment & Plan:   Problem List Items Addressed This Visit    Tobacco abuse   Tick bite of left thigh with local reaction - Primary    Clinically with reported tick attached >72 hours, and resulting localized erythematous rash, without evidence of erythema migrans. - Asymptomatic, seems improved - Concern local reaction vs possible cellulitis  Plan: 1. Cover with Doxycycline 160m BID x 14 days for potential tick borne illness vs secondary bacterial infection 2. Follow-up if not improved within few weeks or sooner, may extend doxy course      Relevant Medications   doxycycline (VIBRA-TABS) 100 MG tablet   Seasonal allergies    Stable, without flare Continue OTC allergy med Consider 2nd gen anti-histamine, Flonase      Pre-diabetes    Uncertain PreDM control, diagnosis by history - Previously on metformin x 6 months years ago  Plan:  1. Not on any therapy currently 2. Encourage improved lifestyle - low carb, low sugar diet, reduce portion size, continue improving regular exercise 3. Follow-up 4 weeks for annual phys + labs A1c      Hyperlipidemia    Last lipids 2015-16 with elevated TG and low HDL Due for repeat lipids Not on any cholesterol therapy      History of hemorrhoids    Resolved on higher fiber diet      Erectile dysfunction  Stable chronic problem, likely multifactorial Not on current therapy, not interested Prior med Cialis      Chronic pain of left knee    Chronic low back pain    Stable chronic problem Suspected underlying OA/DJD Limited conservative therapy Previously followed by [facility]       Other Visit Diagnoses    Encounter to establish care with new doctor          Meds ordered this encounter  Medications  . doxycycline (VIBRA-TABS) 100 MG tablet    Sig: Take 1 tablet (100 mg total) by mouth 2 (two) times daily. For 14 days. Take with full glass of water, stay upright 30 min after taking.    Dispense:  28 tablet    Refill:  0      Follow up plan: Return in about 2 weeks (around 03/04/2017) for Annual Physical.  Due for TDap at next visit. Future labs ordered.  [name], [facility] 02/19/2017, 9:14 AM

## 2017-02-19 ENCOUNTER — Other Ambulatory Visit: Payer: Self-pay | Admitting: Family Medicine

## 2017-02-19 DIAGNOSIS — E782 Mixed hyperlipidemia: Secondary | ICD-10-CM

## 2017-02-19 DIAGNOSIS — Z0000 Encounter for general adult medical examination without abnormal findings: Secondary | ICD-10-CM

## 2017-02-19 DIAGNOSIS — R7303 Prediabetes: Secondary | ICD-10-CM

## 2017-02-19 DIAGNOSIS — Z125 Encounter for screening for malignant neoplasm of prostate: Secondary | ICD-10-CM

## 2017-02-19 NOTE — Assessment & Plan Note (Signed)
Resolved on higher fiber diet

## 2017-02-19 NOTE — Assessment & Plan Note (Signed)
Last lipids 2015-16 with elevated TG and low HDL Due for repeat lipids Not on any cholesterol therapy

## 2017-02-19 NOTE — Assessment & Plan Note (Signed)
Stable, without flare Continue OTC allergy med Consider 2nd gen anti-histamine, Flonase

## 2017-02-19 NOTE — Assessment & Plan Note (Signed)
Uncertain PreDM control, diagnosis by history - Previously on metformin x 6 months years ago  Plan:  1. Not on any therapy currently 2. Encourage improved lifestyle - low carb, low sugar diet, reduce portion size, continue improving regular exercise 3. Follow-up 4 weeks for annual phys + labs A1c

## 2017-02-19 NOTE — Assessment & Plan Note (Signed)
Stable chronic problem, likely multifactorial Not on current therapy, not interested Prior med Cialis

## 2017-02-19 NOTE — Assessment & Plan Note (Signed)
Clinically with reported tick attached >72 hours, and resulting localized erythematous rash, without evidence of erythema migrans. - Asymptomatic, seems improved - Concern local reaction vs possible cellulitis  Plan: 1. Cover with Doxycycline 100mg  BID x 14 days for potential tick borne illness vs secondary bacterial infection 2. Follow-up if not improved within few weeks or sooner, may extend doxy course

## 2017-02-19 NOTE — Assessment & Plan Note (Signed)
Stable chronic problem Suspected underlying OA/DJD Limited conservative therapy Previously followed by [facility]

## 2017-03-10 ENCOUNTER — Other Ambulatory Visit: Payer: BLUE CROSS/BLUE SHIELD

## 2017-03-15 ENCOUNTER — Other Ambulatory Visit: Payer: BLUE CROSS/BLUE SHIELD

## 2017-03-15 DIAGNOSIS — R7303 Prediabetes: Secondary | ICD-10-CM

## 2017-03-15 DIAGNOSIS — Z0000 Encounter for general adult medical examination without abnormal findings: Secondary | ICD-10-CM

## 2017-03-15 DIAGNOSIS — E782 Mixed hyperlipidemia: Secondary | ICD-10-CM

## 2017-03-15 DIAGNOSIS — Z125 Encounter for screening for malignant neoplasm of prostate: Secondary | ICD-10-CM

## 2017-03-15 LAB — CBC WITH DIFFERENTIAL/PLATELET
Basophils Absolute: 88 cells/uL (ref 0–200)
Basophils Relative: 1 %
Eosinophils Absolute: 616 cells/uL — ABNORMAL HIGH (ref 15–500)
Eosinophils Relative: 7 %
HCT: 50.8 % — ABNORMAL HIGH (ref 38.5–50.0)
Hemoglobin: 17.1 g/dL (ref 13.2–17.1)
Lymphocytes Relative: 29 %
Lymphs Abs: 2552 cells/uL (ref 850–3900)
MCH: 33.2 pg — ABNORMAL HIGH (ref 27.0–33.0)
MCHC: 33.7 g/dL (ref 32.0–36.0)
MCV: 98.6 fL (ref 80.0–100.0)
MPV: 11.8 fL (ref 7.5–12.5)
Monocytes Absolute: 704 cells/uL (ref 200–950)
Monocytes Relative: 8 %
Neutro Abs: 4840 cells/uL (ref 1500–7800)
Neutrophils Relative %: 55 %
Platelets: 225 10*3/uL (ref 140–400)
RBC: 5.15 MIL/uL (ref 4.20–5.80)
RDW: 14.5 % (ref 11.0–15.0)
WBC: 8.8 10*3/uL (ref 3.8–10.8)

## 2017-03-16 LAB — COMPLETE METABOLIC PANEL WITH GFR
ALT: 13 U/L (ref 9–46)
AST: 18 U/L (ref 10–35)
Albumin: 3.8 g/dL (ref 3.6–5.1)
Alkaline Phosphatase: 72 U/L (ref 40–115)
BUN: 17 mg/dL (ref 7–25)
CO2: 19 mmol/L — ABNORMAL LOW (ref 20–31)
Calcium: 9.2 mg/dL (ref 8.6–10.3)
Chloride: 106 mmol/L (ref 98–110)
Creat: 1.07 mg/dL (ref 0.70–1.25)
GFR, Est African American: 86 mL/min (ref >=60)
GFR, Est Non African American: 74 mL/min (ref >=60)
Glucose, Bld: 78 mg/dL (ref 65–99)
Potassium: 4.1 mmol/L (ref 3.5–5.3)
Sodium: 139 mmol/L (ref 135–146)
Total Bilirubin: 0.7 mg/dL (ref 0.2–1.2)
Total Protein: 6.4 g/dL (ref 6.1–8.1)

## 2017-03-16 LAB — HEMOGLOBIN A1C
Hgb A1c MFr Bld: 5.4 % (ref <5.7)
Mean Plasma Glucose: 108 mg/dL

## 2017-03-16 LAB — LIPID PANEL
Cholesterol: 227 mg/dL — ABNORMAL HIGH (ref <200)
HDL: 38 mg/dL — ABNORMAL LOW (ref >40)
LDL Cholesterol: 135 mg/dL — ABNORMAL HIGH (ref <100)
Total CHOL/HDL Ratio: 6 Ratio — ABNORMAL HIGH (ref <5.0)
Triglycerides: 271 mg/dL — ABNORMAL HIGH (ref <150)
VLDL: 54 mg/dL — ABNORMAL HIGH (ref <30)

## 2017-03-16 LAB — PSA, TOTAL WITH REFLEX TO PSA, FREE: PSA, Total: 0.5 ng/mL (ref <=4.0)

## 2017-03-18 ENCOUNTER — Other Ambulatory Visit: Payer: Self-pay | Admitting: Family Medicine

## 2017-03-18 ENCOUNTER — Ambulatory Visit (INDEPENDENT_AMBULATORY_CARE_PROVIDER_SITE_OTHER): Payer: BLUE CROSS/BLUE SHIELD | Admitting: Family Medicine

## 2017-03-18 ENCOUNTER — Encounter: Payer: Self-pay | Admitting: Family Medicine

## 2017-03-18 VITALS — BP 119/74 | HR 86 | Temp 98.0°F | Resp 16 | Ht 72.0 in | Wt 186.0 lb

## 2017-03-18 DIAGNOSIS — Z23 Encounter for immunization: Secondary | ICD-10-CM | POA: Diagnosis not present

## 2017-03-18 DIAGNOSIS — Z0000 Encounter for general adult medical examination without abnormal findings: Secondary | ICD-10-CM | POA: Diagnosis not present

## 2017-03-18 DIAGNOSIS — R7303 Prediabetes: Secondary | ICD-10-CM

## 2017-03-18 DIAGNOSIS — Z716 Tobacco abuse counseling: Secondary | ICD-10-CM

## 2017-03-18 DIAGNOSIS — Z720 Tobacco use: Secondary | ICD-10-CM | POA: Diagnosis not present

## 2017-03-18 DIAGNOSIS — W57XXXA Bitten or stung by nonvenomous insect and other nonvenomous arthropods, initial encounter: Secondary | ICD-10-CM

## 2017-03-18 DIAGNOSIS — S20462A Insect bite (nonvenomous) of left back wall of thorax, initial encounter: Secondary | ICD-10-CM

## 2017-03-18 DIAGNOSIS — E782 Mixed hyperlipidemia: Secondary | ICD-10-CM | POA: Diagnosis not present

## 2017-03-18 MED ORDER — DOXYCYCLINE HYCLATE 100 MG PO TABS: 200.0000 mg | ORAL_TABLET | Freq: Once | ORAL | 0 refills | Status: AC

## 2017-03-18 NOTE — Assessment & Plan Note (Signed)
Well-controlled Pre-DM with A1c 5.4 (improved from prior) Concern with HLD  Plan:  1. Not on any therapy currently 2. Encourage improved lifestyle - mediterranean / DASH diet, low carb, low sugar diet, reduce portion size, start regular exercise 3. Follow-up 6 months PreDM A1c

## 2017-03-18 NOTE — Patient Instructions (Signed)
Thank you for coming to the clinic today.  1. Tick Bite looks okay. More localized reaction. Does not seem to be concern for Erythema Migrans or target rash.  Will give Doxycycline 200mg  x 1 dose then done. If spreading or new concerns, fever/chills, target rash, or drainage of pus or something different, notify office, we can do either Amoxicillin 500mg  3 times daily for 2 weeks or Doxycycline AGAIN.  Received TDap tetanus shot today, good for 10 years.  Contact insurance to check cost/coverage of shingles vaccine "Shingrix" series of 2 shots.  Recommend may take a Baby Bayer Aspirin 81mg  daily (can get Enteric Coated as well) once daily for reducing risk of heart attack and stroke.  Try best to quit smoking with patch, can get free patches through Quitline # below 1 800-QUIT NOW  Eat at least 3 meals and 1-2 snacks per day (don't skip breakfast).  Aim for no more than 5 hours between eating. - Tip: If you go >5 hours without eating and become very hungry, your body will supply it's own resources temporarily and you can gain extra weight when you eat.  The 5 Minute Rule of Exercise - Promise yourself to at least do 5 minutes of exercise (make sure you time it), and if at the end of 5 minutes (this is the hardest part of the work-out), if you still feel like you want stop (or not motivated to continue) then allow yourself to stop. Otherwise, more often than not you will feel encouraged that you can continue for a little while longer or even more!  Diet Recommendations for Preventing Diabetes   Limit Starchy (carb) foods include: Bread, Cid, pasta, potatoes, corn, crackers, bagels, muffins, all baked goods.   Protein foods include: Meat, fish, poultry, eggs, dairy foods, and beans such as pinto and kidney beans (beans also provide carbohydrate).   1. Eat at least 3 meals and 1-2 snacks per day. Never go more than 4-5 hours while awake without eating.   2. Limit starchy foods to TWO per  meal and ONE per snack. ONE portion of a starchy  food is equal to the following:   - ONE slice of bread (or its equivalent, such as half of a hamburger bun).   - 1/2 cup of a "scoopable" starchy food such as potatoes or [place].   - 1 OUNCE (28 grams) of starchy snacks (crackers or pretzels, look on label).   - 15 grams of carbohydrate as shown on food label.   3. Both lunch and dinner should include a protein food, a carb food, and vegetables.   - Obtain twice as many veg's as protein or carbohydrate foods for both lunch and dinner.   - Try to keep frozen veg's on hand for a quick vegetable serving.     - Fresh or frozen veg's are best.   4. Breakfast should always include protein.     Please schedule a Follow-up Appointment to: Return in about 6 months (around 09/18/2017) for Pre-DM, Cholesterol, Tobacco.  If you have any other questions or concerns, please feel free to call the clinic or send a message through [facility] [place]. You may also schedule an earlier appointment if necessary.  Additionally, you may be receiving a survey about your experience at our clinic within a few days to 1 week by e-mail or mail. We value your feedback.  [name], DO [facility], [facility]  Fat and Cholesterol Restricted Diet High levels of fat and cholesterol  in your blood may lead to various health problems, such as diseases of the heart, blood vessels, gallbladder, liver, and pancreas. Fats are concentrated sources of energy that come in various forms. Certain types of fat, including saturated fat, may be harmful in excess. Cholesterol is a substance needed by your body in small amounts. Your body makes all the cholesterol it needs. Excess cholesterol comes from the food you eat. When you have high levels of cholesterol and saturated fat in your blood, health problems can develop because the excess fat and cholesterol will gather along the walls of your blood vessels, causing them to narrow.  Choosing the right foods will help you control your intake of fat and cholesterol. This will help keep the levels of these substances in your blood within normal limits and reduce your risk of disease. What is my plan? Your health care provider recommends that you:  Limit your fat intake to ______% or less of your total calories per day.  Limit the amount of cholesterol in your diet to less than _________mg per day.  Eat 20-30 grams of fiber each day.  What types of fat should I choose?  Choose healthy fats more often. Choose monounsaturated and polyunsaturated fats, such as olive and canola oil, flaxseeds, walnuts, almonds, and seeds.  Eat more omega-3 fats. Good choices include salmon, mackerel, sardines, tuna, flaxseed oil, and ground flaxseeds. Aim to eat fish at least two times a week.  Limit saturated fats. Saturated fats are primarily found in animal products, such as meats, butter, and cream. Plant sources of saturated fats include palm oil, palm kernel oil, and coconut oil.  Avoid foods with partially hydrogenated oils in them. These contain trans fats. Examples of foods that contain trans fats are stick margarine, some tub margarines, cookies, crackers, and other baked goods. What general guidelines do I need to follow? These guidelines for healthy eating will help you control your intake of fat and cholesterol:  Check food labels carefully to identify foods with trans fats or high amounts of saturated fat.  Fill one half of your plate with vegetables and green salads.  Fill one fourth of your plate with whole grains. Look for the word "whole" as the first word in the ingredient list.  Fill one fourth of your plate with lean protein foods.  Limit fruit to two servings a day. Choose fruit instead of juice.  Eat more foods that contain fiber, such as apples, broccoli, carrots, beans, peas, and barley.  Eat more home-cooked food and less restaurant, buffet, and fast  food.  Limit or avoid alcohol.  Limit foods high in starch and sugar.  Limit fried foods.  Cook foods using methods other than frying. Baking, boiling, grilling, and broiling are all great options.  Lose weight if you are overweight. Losing just 5-10% of your initial body weight can help your overall health and prevent diseases such as diabetes and heart disease.  What foods can I eat? Grains  Whole grains, such as whole wheat or whole grain breads, crackers, cereals, and pasta. Unsweetened oatmeal, bulgur, barley, quinoa, or brown [place]. Corn or whole wheat flour tortillas. Vegetables  Fresh or frozen vegetables (raw, steamed, roasted, or grilled). Green salads. Fruits  All fresh, canned (in natural juice), or frozen fruits. Meats and other protein foods  Ground beef (85% or leaner), grass-fed beef, or beef trimmed of fat. Skinless chicken or [place]. Ground chicken or [place]. Pork trimmed of fat. All fish and seafood. Eggs. Dried  beans, peas, or lentils. Unsalted nuts or seeds. Unsalted canned or dry beans. Dairy  Low-fat dairy products, such as skim or 1% milk, 2% or reduced-fat cheeses, low-fat ricotta or cottage cheese, or plain low-fat yo Fats and oils  Tub margarines without trans fats. Light or reduced-fat mayonnaise and salad dressings. Avocado. Olive, canola, sesame, or safflower oils. Natural peanut or almond butter (choose ones without added sugar and oil). The items listed above may not be a complete list of recommended foods or beverages. Contact your dietitian for more options. Foods to avoid Grains  White bread. White pasta. White [place]. Cornbread. Bagels, pastries, and croissants. Crackers that contain trans fat. Vegetables  White potatoes. Corn. Creamed or fried vegetables. Vegetables in a cheese sauce. Fruits  Dried fruits. Canned fruit in light or heavy syrup. Fruit juice. Meats and other protein foods  Fatty cuts of meat. Ribs, chicken wings, bacon,  sausage, bologna, salami, chitterlings, fatback, hot dogs, bratwurst, and packaged luncheon meats. Liver and organ meats. Dairy  Whole or 2% milk, cream, half-and-half, and cream cheese. Whole milk cheeses. Whole-fat or sweetened yogurt. Full-fat cheeses. Nondairy creamers and whipped toppings. Processed cheese, cheese spreads, or cheese curds. Beverages  Alcohol. Sweetened drinks (such as sodas, lemonade, and fruit drinks or punches). Fats and oils  Butter, stick margarine, lard, shortening, ghee, or bacon fat. Coconut, palm kernel, or palm oils. Sweets and desserts  Corn syrup, sugars, honey, and molasses. Candy. Jam and jelly. Syrup. Sweetened cereals. Cookies, pies, cakes, donuts, muffins, and ice cream. The items listed above may not be a complete list of foods and beverages to avoid. Contact your dietitian for more information. This information is not intended to replace advice given to you by your health care provider. Make sure you discuss any questions you have with your health care provider. Document Released: 08/23/2005 Document Revised: 09/13/2014 Document Reviewed: 11/21/2013 Elsevier Interactive Patient Education  2017 [employer].

## 2017-03-18 NOTE — Assessment & Plan Note (Signed)
Uncontrolled cholesterol with sub-optimal lifestyle. Never on meds. Last lipid panel 03/2017 - TG >270, HDL 38 borderline low, LDL 135 elevated Calculated ASCVD 10 yr risk score 20% as smoker (13.5% if quit smoking)  Plan: 1. Discussed ASCVD risk and primary prevention - offered statin therapy, agree to hold for now and try more aggressive lifestyle management. At future visit if still abnormal, can consider Statin, also may consider Fish Oil for TG 2. Recommend starting ASA 81mg  for primary ASCVD risk reduction 3. Encourage improved lifestyle - low carb/cholesterol emphasized DASH / Mediterranean diet options, reduce portion size, start regular exercise 4. Follow-up 6 months re-check fasting lipids A1c

## 2017-03-18 NOTE — Assessment & Plan Note (Signed)
Discussed smoking cessation briefly. Patient ready to quit. He will start OTC NRT patches Quitline # given Follow-up

## 2017-03-18 NOTE — Progress Notes (Signed)
Subjective:    [name], male    DOB: 04-04-1956, 61 y.o.   MRN: [id number]  Ethan Fitzgerald is a 61 y.o. male presenting on 03/18/2017 for Annual Exam   HPI   Here for Annual Physical and to review lab results.  Tick Bite, Left Mid Back: - Last visit with me 02/18/17 for similar problem with other tick bite on left inner thigh, had erythema tick on for >2-3 days, then rash changed, treated with Doxycycline for 2 weeks with resolution, see prior notes for background information. - Interval update with no problems from first tick bite, now has a new one - Today patient reports he noticed new tick gorged after likely being on for 2 days on left mid back, tick removed completely, had localized red rash. - Denies pain, drainage, pus, spreading rash or changing, fever/chills  Pre-Diabetes Reports prior concern with dx Pre-DM, unsure A1c in past. He is pleased with result 5.4 CBGs: Does not check CBG Meds: Never on meds Currently not on ACEi / ARB Denies hypoglycemia, polyuria, visual changes, numbness or tingling.  HYPERLIPIDEMIA: - Reports prior history of concern was told hyperTG in past. Last lipid panel 03/2017 abnormal elevated TG >270, low HDL 38, elevated LDL 135 - Never on cholesterol medication, no statin, or OTC Fish Oil Lifestyle - Diet: No changes since last visit. Eating variable diet, not limiting fats or carbs yet. Interested to try Mediterranean Diet and he plans to reduce bread and chocolate, thinks these are problem - Exercise: No increase, but still walking at work, and has treadmill and may try to resume this twice daily like he was in past - Weight stable without significant change  Tobacco Abuse - Still active smoker, < 1/4 pack about 4-6 cigarettes daily. Has quit before with patches, needs to use for a while. He is ready to try again. Understands now more risk of health with smoking due to ASCVD risk on blood work. Usually smokes due to stress, >40  years smoking.  Health Maintenance: - Due TDap today - will receive - Due routine Hep C and HIV screening - declines - Last colonoscopy 07/2014 - Last PSA 0.5 (03/2017), asymptomatic, prior DRE normal. No family history prostate CA  Past Medical History:  Diagnosis Date  . Erectile dysfunction   . Headache    daily, stress headaches  . Knee pain, left   . Seasonal allergies   . Tobacco dependence    Past Surgical History:  Procedure Laterality Date  . BACK SURGERY     metal plate in back  . COLONOSCOPY    . HERNIA REPAIR Right   . KNEE ARTHROSCOPY Left 03/28/2015   Procedure: Arthroscopic partial medial meniscectomy plus chondral debridement;  Surgeon: Leanor Kail, MD;  Location: [facility];  Service: Orthopedics;  Laterality: Left;   Social History   Social History  . Marital status: Married    Spouse name: N/A  . Number of children: N/A  . Years of education: N/A   Occupational History  . Not on file.   Social History Main Topics  . Smoking status: Current Every Day Smoker    Packs/day: 0.25    Years: 40.00    Types: Cigarettes  . Smokeless tobacco: Current User  . Alcohol use Yes     Comment: 6 pack on weekend  . Drug use: No  . Sexual activity: Not on file   Other Topics Concern  . Not on file  Social History Narrative  . No narrative on file   Family History  Problem Relation Age of Onset  . Diabetes Mother   . Heart attack Mother 41  . Cancer Father        liver   . COPD Brother   . HIV/AIDS Brother    Current Outpatient Prescriptions on File Prior to Visit  Medication Sig  . vitamin C (ASCORBIC ACID) 500 MG tablet Take 500 mg by mouth daily. Am  . Aspirin-Acetaminophen-Caffeine (EXCEDRIN PO) Take 2 tablets by mouth. am   No current facility-administered medications on file prior to visit.     Review of Systems  Constitutional: Negative for activity change, appetite change, chills, diaphoresis, fatigue, fever and unexpected  weight change.  HENT: Negative for congestion, hearing loss and sinus pressure.   Eyes: Negative for visual disturbance.  Respiratory: Negative for cough, chest tightness, shortness of breath and wheezing.   Cardiovascular: Negative for chest pain, palpitations and leg swelling.  Gastrointestinal: Negative for abdominal pain, anal bleeding, blood in stool, constipation, diarrhea, nausea and vomiting.  Endocrine: Negative for cold intolerance and polyuria.  Genitourinary: Negative for decreased urine volume, difficulty urinating, dysuria, frequency, hematuria and testicular pain.  Musculoskeletal: Negative for arthralgias, back pain and neck pain.  Skin: Positive for rash (left mid to upper back, tick bite).  Allergic/Immunologic: Negative for environmental allergies.  Neurological: Negative for dizziness, weakness, light-headedness, numbness and headaches.  Hematological: Negative for adenopathy.  Psychiatric/Behavioral: Negative for behavioral problems, dysphoric mood and sleep disturbance. The patient is not nervous/anxious.    Per HPI unless specifically indicated above     Objective:    BP 119/74   Pulse 86   Temp 98 F (36.7 C) (Oral)   Resp 16   Ht 6' (1.829 m)   Wt 186 lb (84.4 kg)   BMI 25.23 kg/m   Wt Readings from Last 3 Encounters:  03/18/17 186 lb (84.4 kg)  02/18/17 188 lb (85.3 kg)  03/28/15 191 lb (86.6 kg)    Physical Exam  Constitutional: He is oriented to person, place, and time. He appears well-developed and well-nourished. No distress.  Well-appearing, comfortable, cooperative  HENT:  Head: Normocephalic and atraumatic.  Mouth/Throat: Oropharynx is clear and moist.  Eyes: Pupils are equal, round, and reactive to light. Conjunctivae and EOM are normal. Right eye exhibits no discharge. Left eye exhibits no discharge.  Neck: Normal range of motion. Neck supple. No thyromegaly present.  Cardiovascular: Normal rate, regular rhythm, normal heart sounds and  intact distal pulses.   No murmur heard. Pulmonary/Chest: Effort normal and breath sounds normal. No respiratory distress. He has no wheezes. He has no rales.  Abdominal: Soft. Bowel sounds are normal. He exhibits no distension and no mass. There is no tenderness.  Genitourinary:  Genitourinary Comments: Deferred DRE / genital exam  Musculoskeletal: Normal range of motion. He exhibits no edema or tenderness.  Upper / Lower Extremities: - Normal muscle tone, strength bilateral upper extremities 5/5, lower extremities 5/5  Lymphadenopathy:    He has no cervical adenopathy.  Neurological: He is alert and oriented to person, place, and time.  Distal sensation intact to light touch all extremities  Skin: Skin is warm and dry. Rash (Left mid upper back with 3-4 cm splotchy erythematous rash surrounding central tick bite, no evidence of central clearing, or erythema migrans, non tender, no induration,) noted. He is not diaphoretic. There is erythema.  Psychiatric: He has a normal mood and affect. His behavior is  normal.  Well groomed, good eye contact, normal speech and thoughts  Nursing note and vitals reviewed.  Results for orders placed or performed in visit on 03/15/17  COMPLETE METABOLIC PANEL WITH GFR  Result Value Ref Range   Sodium 139 135 - 146 mmol/L   Potassium 4.1 3.5 - 5.3 mmol/L   Chloride 106 98 - 110 mmol/L   CO2 19 (L) 20 - 31 mmol/L   Glucose, Bld 78 65 - 99 mg/dL   BUN 17 7 - 25 mg/dL   Creat 1.07 0.70 - 1.25 mg/dL   Total Bilirubin 0.7 0.2 - 1.2 mg/dL   Alkaline Phosphatase 72 40 - 115 U/L   AST 18 10 - 35 U/L   ALT 13 9 - 46 U/L   Total Protein 6.4 6.1 - 8.1 g/dL   Albumin 3.8 3.6 - 5.1 g/dL   Calcium 9.2 8.6 - 10.3 mg/dL   GFR, Est African American 86 >=60 mL/min   GFR, Est Non African American 74 >=60 mL/min  Lipid panel  Result Value Ref Range   Cholesterol 227 (H) <200 mg/dL   Triglycerides 271 (H) <150 mg/dL   HDL 38 (L) >40 mg/dL   Total CHOL/HDL Ratio  6.0 (H) <5.0 Ratio   VLDL 54 (H) <30 mg/dL   LDL Cholesterol 135 (H) <100 mg/dL  Hemoglobin A1c  Result Value Ref Range   Hgb A1c MFr Bld 5.4 <5.7 %   Mean Plasma Glucose 108 mg/dL  CBC with Differential/Platelet  Result Value Ref Range   WBC 8.8 3.8 - 10.8 K/uL   RBC 5.15 4.20 - 5.80 MIL/uL   Hemoglobin 17.1 13.2 - 17.1 g/dL   HCT 50.8 (H) 38.5 - 50.0 %   MCV 98.6 80.0 - 100.0 fL   MCH 33.2 (H) 27.0 - 33.0 pg   MCHC 33.7 32.0 - 36.0 g/dL   RDW 14.5 11.0 - 15.0 %   Platelets 225 140 - 400 K/uL   MPV 11.8 7.5 - 12.5 fL   Neutro Abs 4,840 1,500 - 7,800 cells/uL   Lymphs Abs 2,552 850 - 3,900 cells/uL   Monocytes Absolute 704 200 - 950 cells/uL   Eosinophils Absolute 616 (H) 15 - 500 cells/uL   Basophils Absolute 88 0 - 200 cells/uL   Neutrophils Relative % 55 %   Lymphocytes Relative 29 %   Monocytes Relative 8 %   Eosinophils Relative 7 %   Basophils Relative 1 %   Smear Review Criteria for review not met   PSA, Total with Reflex to PSA, Free  Result Value Ref Range   PSA, Total 0.5 <=4.0 ng/mL      Assessment & Plan:   Problem List Items Addressed This Visit    Tobacco abuse    Discussed smoking cessation briefly. Patient ready to quit. He will start OTC NRT patches Quitline # given Follow-up  Discussion today >5 minutes (<10 minutes) specifically on counseling on risks of tobacco use, complications, treatment, smoking cessation.      Pre-diabetes    Well-controlled Pre-DM with A1c 5.4 (improved from prior) Concern with HLD  Plan:  1. Not on any therapy currently 2. Encourage improved lifestyle - mediterranean / DASH diet, low carb, low sugar diet, reduce portion size, start regular exercise 3. Follow-up 6 months PreDM A1c      Hyperlipidemia    Uncontrolled cholesterol with sub-optimal lifestyle. Never on meds. Last lipid panel 03/2017 - TG >270, HDL 38 borderline low, LDL 135 elevated Calculated ASCVD  10 yr risk score 20% as smoker (13.5% if quit  smoking)  Plan: 1. Discussed ASCVD risk and primary prevention - offered statin therapy, agree to hold for now and try more aggressive lifestyle management. At future visit if still abnormal, can consider Statin, also may consider Fish Oil for TG 2. Recommend starting ASA 47m for primary ASCVD risk reduction 3. Encourage improved lifestyle - low carb/cholesterol emphasized DASH / Mediterranean diet options, reduce portion size, start regular exercise 4. Follow-up 6 months re-check fasting lipids A1c       Other Visit Diagnoses    Annual physical exam    -  Primary   Need for diphtheria-tetanus-pertussis (Tdap) vaccine       Relevant Orders   Tdap vaccine greater than or equal to 7yo IM (Completed)   Tick bite of left upper back excluding scapular region, initial encounter   Recent tick bite in groin with rash and changes, covered for 14 day course with Doxycycline, just finished about 1-2 weeks ago. Now new tick bite on back, gorged tick reportedly after 1-2 days attached. - Localized rash, but looks like inflammatory response, not erythema migrans - Agree to try rx Doxycycline 2063mx 1 dose for prophylaxis since < 72 hours, and asymptomatic - Advised watch rash if changes to erythema migrans or spreads or new symptoms, notify office soon and will send either Amoxicillin 50031mID x 2 weeks or repeat Doxycycline 100m36mD x 2 weeks        Relevant Medications   doxycycline (VIBRA-TABS) 100 MG tablet   Encounter for smoking cessation counseling          Meds ordered this encounter  Medications  . doxycycline (VIBRA-TABS) 100 MG tablet    Sig: Take 2 tablets (200 mg total) by mouth once. Take with full glass of water, stay seated or standing 30 min after taking.    Dispense:  2 tablet    Refill:  0    Follow up plan: Return in about 6 months (around 09/18/2017) for Pre-DM, Cholesterol, Tobacco.   AlexNobie Putnam [place] 03/18/2017, 12:09 PM

## 2017-08-26 ENCOUNTER — Ambulatory Visit: Payer: BLUE CROSS/BLUE SHIELD | Admitting: Family Medicine

## 2017-08-26 ENCOUNTER — Encounter: Payer: Self-pay | Admitting: Family Medicine

## 2017-08-26 ENCOUNTER — Other Ambulatory Visit: Payer: Self-pay | Admitting: Family Medicine

## 2017-08-26 VITALS — BP 126/83 | HR 76 | Temp 98.3°F | Resp 16 | Ht 72.0 in | Wt 180.6 lb

## 2017-08-26 DIAGNOSIS — R7303 Prediabetes: Secondary | ICD-10-CM

## 2017-08-26 DIAGNOSIS — N529 Male erectile dysfunction, unspecified: Secondary | ICD-10-CM | POA: Diagnosis not present

## 2017-08-26 DIAGNOSIS — J209 Acute bronchitis, unspecified: Secondary | ICD-10-CM

## 2017-08-26 DIAGNOSIS — E782 Mixed hyperlipidemia: Secondary | ICD-10-CM

## 2017-08-26 MED ORDER — SILDENAFIL CITRATE 20 MG PO TABS: ORAL_TABLET | ORAL | 2 refills | Status: DC

## 2017-08-26 MED ORDER — AZITHROMYCIN 250 MG PO TABS: ORAL_TABLET | ORAL | 0 refills | Status: DC

## 2017-08-26 NOTE — Patient Instructions (Addendum)
Thank you for coming to the office today.  1. It sounds like you had an Upper Respiratory Virus that has settled into a Bronchitis, lower respiratory tract infection. I don't have concerns for pneumonia today, and think that this should gradually improve. Once you are feeling better, the cough may take a few weeks to fully resolve. I do hear some coarse breath sounds, this may be due to the virus, also could be related to smoking.  start Azithromycin Z pak (antibiotic) 2 tabs day 1, then 1 tab x 4 days, complete entire course even if improved  May continue Mucinex to continue this clear congestion  - Drink plenty of fluids to improve congestion  If not improved notify office and we can consider - Prednisone 50mg  daily for next 5 days - this will open up lungs allow you to breath better and treat that wheezing or bronchospasm  Can consider Albuterol inhaler in future  If your symptoms seem to worsen instead of improve over next several days, including significant fever / chills, worsening shortness of breath, worsening wheezing, or nausea / vomiting and can't take medicines - return sooner or go to hospital Emergency Department for more immediate treatment.    Try Sildenafil as instructed, start with low dose and work way up, one dose per 24 hours, if need new rx let me know  In future consider 2nd opinion Urology again  Discount generic Sildenafil pills $1 per [place]   Address: [address] [place], [place] [address] Hours: 8:30AM-6:30PM Phone: [phone]  DUE for [place] (no food or drink after midnight before the lab appointment, only water or coffee without cream/sugar on the morning of)  SCHEDULE "Lab Only" visit in the morning at the clinic for lab draw in [place]   - Make sure Lab Only appointment is at about 1 week before your next appointment, so that results will be available   Follow-up fasting lab only in 6 weeks then 1 week after for office  visit PreDM, HLD lab review   Please schedule a Follow-up Appointment to: Return in about 1 week (around 09/02/2017), or if symptoms worsen or fail to improve, for bronchitis.    If you have any other questions or concerns, please feel free to call the office or send a message through [facility]. You may also schedule an earlier appointment if necessary.  Additionally, you may be receiving a survey about your experience at our office within a few days to 1 week by e-mail or mail. We value your feedback.  [name], DO [facility]

## 2017-08-26 NOTE — Progress Notes (Signed)
Subjective:    [name], male    DOB: Dec 20, 1955, 61 y.o.   MRN: [id number]  Ethan Fitzgerald is a 61 y.o. male presenting on 08/26/2017 for Cough (nasal congestion, cough onset 5 days OTC not improving his Sxs)   HPI   URI Cough / Bronchitis / Active Smoker - Reports recent illness started about 5 days ago. Described URI cold cough symptoms with congestion then worsening now to bronchitis - Tried NyQuil PRN with limited relief, Mucinex - Recent sick contacts with grandchildren after snow-storm, then his wife got sick and then he later got sick - He is active smoker ,now reduced down to 3 cigs daily - Denies any fever/chills, sweats, body aches, nausea vomiting, diarrhea, abdominal pain, sinus pain or ear pain  Erectile Dysfunction - Reports history of 5-6 years, has been a chronic problem, cannot get full erection. No overnight erection. States he has normal sexual drive or desire - In past he was referred to [facility], he was tried on Vitamin E without improvement and also they offered a pump but he declined - Prior doctor he was treated with Cialis 47m with mild improvement - No known dx of BPH - Denies penile pain, testicular swelling redness  Other updates - Reduced smoking to 3 cigs daily - Improved diet, trying to eat healthier - Stopped eating chocolate - Some increased beer intake with stress at home He started a Vegetable vitamin supplement. Now BMs have been every other day normal. No hemorrhoids.  Health Maintenance: Due for Flu Shot, declines today despite counseling on benefits  [facility]  Decreased Interest 0  Down, Depressed, Hopeless 0  PHQ - 2 Score 0    Social History   Tobacco Use  . Smoking status: Current Every Day Smoker    Packs/day: 0.25    Years: 40.00    Pack years: 10.00    Types: Cigarettes  . Smokeless tobacco: Current User  Substance Use Topics  . Alcohol use: Yes    Comment: 6 pack  on weekend  . Drug use: No    Review of Systems Per HPI unless specifically indicated above     Objective:    BP 126/83   Pulse 76   Temp 98.3 F (36.8 C) (Oral)   Resp 16   Ht 6' (1.829 m)   Wt 180 lb 9.6 oz (81.9 kg)   SpO2 98%   BMI 24.49 kg/m   Wt Readings from Last 3 Encounters:  08/26/17 180 lb 9.6 oz (81.9 kg)  03/18/17 186 lb (84.4 kg)  02/18/17 188 lb (85.3 kg)    Physical Exam  Constitutional: He is oriented to person, place, and time. He appears well-developed and well-nourished. No distress.  Well-appearing, comfortable, cooperative  HENT:  Head: Normocephalic and atraumatic.  Mouth/Throat: Oropharynx is clear and moist.  Frontal / maxillary sinuses non-tender. Nares patent without purulence or edema. Bilateral TMs clear with mild clear effusion without erythema or bulging. Oropharynx clear without erythema, exudates, edema or asymmetry.  Eyes: Conjunctivae are normal. Right eye exhibits no discharge. Left eye exhibits no discharge.  Neck: Normal range of motion. Neck supple.  Cardiovascular: Normal rate, regular rhythm, normal heart sounds and intact distal pulses.  No murmur heard. Pulmonary/Chest: Effort normal and breath sounds normal. No respiratory distress. He has no wheezes.  Mild coarse breath sounds Left lower lung > R, not cleared with cough seems rhonchi. No fine crackles. No wheezing. Speaks  full sentences. Occasional coughing.  Musculoskeletal: He exhibits no edema.  Lymphadenopathy:    He has no cervical adenopathy.  Neurological: He is alert and oriented to person, place, and time.  Skin: Skin is warm and dry. No rash noted. He is not diaphoretic. No erythema.  Psychiatric: He has a normal mood and affect. His behavior is normal.  Well groomed, good eye contact, normal speech and thoughts  Nursing note and vitals reviewed.  Results for orders placed or performed in visit on 03/15/17  COMPLETE METABOLIC PANEL WITH GFR  Result Value Ref Range    Sodium 139 135 - 146 mmol/L   Potassium 4.1 3.5 - 5.3 mmol/L   Chloride 106 98 - 110 mmol/L   CO2 19 (L) 20 - 31 mmol/L   Glucose, Bld 78 65 - 99 mg/dL   BUN 17 7 - 25 mg/dL   Creat 1.07 0.70 - 1.25 mg/dL   Total Bilirubin 0.7 0.2 - 1.2 mg/dL   Alkaline Phosphatase 72 40 - 115 U/L   AST 18 10 - 35 U/L   ALT 13 9 - 46 U/L   Total Protein 6.4 6.1 - 8.1 g/dL   Albumin 3.8 3.6 - 5.1 g/dL   Calcium 9.2 8.6 - 10.3 mg/dL   GFR, Est African American 86 >=60 mL/min   GFR, Est Non African American 74 >=60 mL/min  Lipid panel  Result Value Ref Range   Cholesterol 227 (H) <200 mg/dL   Triglycerides 271 (H) <150 mg/dL   HDL 38 (L) >40 mg/dL   Total CHOL/HDL Ratio 6.0 (H) <5.0 Ratio   VLDL 54 (H) <30 mg/dL   LDL Cholesterol 135 (H) <100 mg/dL  Hemoglobin A1c  Result Value Ref Range   Hgb A1c MFr Bld 5.4 <5.7 %   Mean Plasma Glucose 108 mg/dL  CBC with Differential/Platelet  Result Value Ref Range   WBC 8.8 3.8 - 10.8 K/uL   RBC 5.15 4.20 - 5.80 MIL/uL   Hemoglobin 17.1 13.2 - 17.1 g/dL   HCT 50.8 (H) 38.5 - 50.0 %   MCV 98.6 80.0 - 100.0 fL   MCH 33.2 (H) 27.0 - 33.0 pg   MCHC 33.7 32.0 - 36.0 g/dL   RDW 14.5 11.0 - 15.0 %   Platelets 225 140 - 400 K/uL   MPV 11.8 7.5 - 12.5 fL   Neutro Abs 4,840 1,500 - 7,800 cells/uL   Lymphs Abs 2,552 850 - 3,900 cells/uL   Monocytes Absolute 704 200 - 950 cells/uL   Eosinophils Absolute 616 (H) 15 - 500 cells/uL   Basophils Absolute 88 0 - 200 cells/uL   Neutrophils Relative % 55 %   Lymphocytes Relative 29 %   Monocytes Relative 8 %   Eosinophils Relative 7 %   Basophils Relative 1 %   Smear Review Criteria for review not met   PSA, Total with Reflex to PSA, Free  Result Value Ref Range   PSA, Total 0.5 <=4.0 ng/mL      Assessment & Plan:   Problem List Items Addressed This Visit    Erectile dysfunction    Consistent with likely age-related ED. Likely also secondary vascular etiology with smoker, HLD - Prior PDE with  improvement  Plan: 1. Trial on generic Sildenafil 57m tabs, take 1-5 tabs about 30 min prior to sexual activity, refills provided - printed rx for [place]2. Follow-up as needed      Relevant Medications   sildenafil (REVATIO) 20 MG tablet  Other Visit Diagnoses    Acute bronchitis, unspecified organism    -  Primary  Consistent with worsening bronchitis in setting of likely viral URI (+sick contacts). Concern with duration >5 days and with upcoming holiday limited time for follow-up - Afebrile, no focal signs of infection (not consistent with pneumonia by history or exam), no evidence sinusitis. Mild coarse breath sounds, Left lower, (without history of COPD, but active smoker), mild reduced air movement.  Plan: 1. Start Azithromycin Z-pak dosing 578m then 2545mdaily x 4 days - If not improving can consider adding Prednisone 5021maily x 5 days burst treatment given bronchospasm - Also consider add Albuterol Inhaler if not improved - Continue mucinex to clear chest congestion 2. Return criteria reviewed, follow-up within 1 week if not improved    Relevant Medications   azithromycin (ZITHROMAX Z-PAK) 250 MG tablet      Meds ordered this encounter  Medications  . sildenafil (REVATIO) 20 MG tablet    Sig: Take 1-5 pills about 30 min prior to sex. Start with 1 and increase as needed.    Dispense:  30 tablet    Refill:  2  . azithromycin (ZITHROMAX Z-PAK) 250 MG tablet    Sig: Take 2 tabs (500m55mtal) on Day 1. Take 1 tab (250mg14mily for next 4 days.    Dispense:  6 tablet    Refill:  0    Follow up plan: Return in about 1 week (around 09/02/2017), or if symptoms worsen or fail to improve, for bronchitis.  Future orders for 10/07/17 Lipids and A1c  AlexaNobie [place]nam[place] 08/26/2017, 5:10 PM

## 2017-08-26 NOTE — Assessment & Plan Note (Signed)
Consistent with likely age-related ED. Likely also secondary vascular etiology with smoker, HLD - Prior PDE with improvement  Plan: 1. Trial on generic Sildenafil 20mg  tabs, take 1-5 tabs about 30 min prior to sexual activity, refills provided - printed rx for [place] 2. Follow-up as needed

## 2017-09-20 ENCOUNTER — Encounter: Payer: Self-pay | Admitting: Family Medicine

## 2017-09-20 ENCOUNTER — Ambulatory Visit: Payer: BLUE CROSS/BLUE SHIELD | Admitting: Family Medicine

## 2017-09-20 VITALS — BP 128/88 | HR 74 | Temp 97.8°F | Resp 16 | Ht 72.0 in | Wt 183.4 lb

## 2017-09-20 DIAGNOSIS — Z720 Tobacco use: Secondary | ICD-10-CM | POA: Diagnosis not present

## 2017-09-20 DIAGNOSIS — J209 Acute bronchitis, unspecified: Secondary | ICD-10-CM

## 2017-09-20 MED ORDER — ALBUTEROL SULFATE HFA 108 (90 BASE) MCG/ACT IN AERS: 2.0000 | INHALATION_SPRAY | RESPIRATORY_TRACT | 0 refills | Status: DC | PRN

## 2017-09-20 MED ORDER — BENZONATATE 100 MG PO CAPS: 100.0000 mg | ORAL_CAPSULE | Freq: Three times a day (TID) | ORAL | 0 refills | Status: DC | PRN

## 2017-09-20 MED ORDER — PREDNISONE 20 MG PO TABS: 40.0000 mg | ORAL_TABLET | Freq: Every day | ORAL | 0 refills | Status: DC

## 2017-09-20 NOTE — Assessment & Plan Note (Signed)
Quit smoking 09/14/17 Secondary to cough/bronchitis Encouraged continued cessation, can follow-up if needed, see note from before with trial on OTC NRT Patches and Quitline, he will continue these as needed

## 2017-09-20 NOTE — Patient Instructions (Addendum)
Thank you for coming to the office today.  1.  It sounds like you had an Upper Respiratory Virus that has settled into a Bronchitis, lower respiratory tract infection. I don't have concerns for pneumonia today, and think that this should gradually improve. Once you are feeling better, the cough may take a few weeks to fully resolve. I do hear wheezing and coarse breath sounds, this may be due to the virus, also could be related to smoking.  - Start Prednisone 50mg  daily for next 5 days - this will open up lungs allow you to breath better and treat that wheezing or bronchospasm  - Use Albuterol inhaler 2 puffs every 4-6 hours around the clock for next 2-3 days, max up to 5 days then use as needed (ask pharmacist to demonstrate proper inhaler use) - IF not improving after 24-48 hours, or develop worsening fever, productive cough then we would recommend return for CHEST X-RAY  Start Tessalon Perls take 1 capsule up to 3 times a day as needed for cough  We can consider another round of antibiotics if needed  - Use nasal saline (Simply Saline or Ocean Spray) to flush nasal congestion multiple times a day, may help cough  - Drink plenty of fluids to improve congestion  If your symptoms seem to worsen instead of improve over next several days, including significant fever / chills, worsening shortness of breath, worsening wheezing, or nausea / vomiting and can't take medicines - return sooner or go to hospital Emergency Department for more immediate treatment.  Please schedule a Follow-up Appointment to: Return in about 1 week (around 09/27/2017), or if symptoms worsen or fail to improve, for bronchospasm.    If you have any other questions or concerns, please feel free to call the office or send a message through [place]. You may also schedule an earlier appointment if necessary.  Additionally, you may be receiving a survey about your experience at our office within a few days to 1 week by e-mail or  mail. We value your feedback.  [name], DO [place]

## 2017-09-20 NOTE — Progress Notes (Signed)
Subjective:    [name], male    DOB: 07-09-1956, 62 y.o.   MRN: [id number]  Ethan Fitzgerald is a 62 y.o. male presenting on 09/20/2017 for Cough (getting worst follow up)   HPI   FOLLOW-UP BRONCHITIS - Last visit with me 08/26/17, for same problem URI bronchitis, treated with Azithromycin Z-pak, see prior notes for background information. - Interval update with dramatic improvement after about 5-6 days in antibiotic, nearly 100% back to baseline, with nearly resolved cough, then had episode 1 week ago with feeling - Today patient reports now worsening symptoms - Quit smoking last week due to cough - Admits difficulty sleeping at night due to coughing spells, productive sputum in morning, but then more dry cough in day. Worse at night with dry heat at home - Denies any sinus pressure pain or congestion, fever/chills, sweats, body aches, vomiting, nausea, diarrhea  TOBACCO ABUSE Quit smoking 09/14/17, with illness, he is motivated to stay off cigarettes  Depression screen [facility] 2/9 02/18/2017  Decreased Interest 0  Down, Depressed, Hopeless 0  PHQ - 2 Score 0    Social History   Tobacco Use  . Smoking status: Former Smoker    Packs/day: 0.25    Years: 40.00    Pack years: 10.00    Types: Cigarettes    Last attempt to quit: 09/14/2017    Years since quitting: 0.0  . Smokeless tobacco: Former Network engineer Use Topics  . Alcohol use: Yes    Comment: 6 pack on weekend  . Drug use: No    Review of Systems Per HPI unless specifically indicated above     Objective:    BP 128/88   Pulse 74   Temp 97.8 F (36.6 C) (Oral)   Resp 16   Ht 6' (1.829 m)   Wt 183 lb 6.4 oz (83.2 kg)   SpO2 97%   BMI 24.87 kg/m   Wt Readings from Last 3 Encounters:  09/20/17 183 lb 6.4 oz (83.2 kg)  08/26/17 180 lb 9.6 oz (81.9 kg)  03/18/17 186 lb (84.4 kg)    Physical Exam  Constitutional: He is oriented to person, place, and time. He appears well-developed and  well-nourished. No distress.  Well-appearing, comfortable, cooperative  HENT:  Head: Normocephalic and atraumatic.  Mouth/Throat: Oropharynx is clear and moist.  Frontal / maxillary sinuses non-tender. Nares patent without purulence or edema. Bilateral TMs clear with mild clear effusion without erythema or bulging. Oropharynx clear without erythema, exudates, edema or asymmetry.  Eyes: Conjunctivae are normal. Right eye exhibits no discharge. Left eye exhibits no discharge.  Neck: Normal range of motion. Neck supple.  Cardiovascular: Normal rate, regular rhythm, normal heart sounds and intact distal pulses.  No murmur heard. Pulmonary/Chest: Effort normal and breath sounds normal. No respiratory distress. He has no wheezes.  Still mild coarse breath sounds now seems R > L lower lung, not cleared with cough and some expiratory wheezes. No fine crackles. Speaks full sentences. No coughing spells  Musculoskeletal: He exhibits no edema.  Lymphadenopathy:    He has no cervical adenopathy.  Neurological: He is alert and oriented to person, place, and time.  Skin: Skin is warm and dry. No rash noted. He is not diaphoretic. No erythema.  Psychiatric: He has a normal mood and affect. His behavior is normal.  Well groomed, good eye contact, normal speech and thoughts  Nursing note and vitals reviewed.  Results for orders placed or performed in  visit on 03/15/17  COMPLETE METABOLIC PANEL WITH GFR  Result Value Ref Range   Sodium 139 135 - 146 mmol/L   Potassium 4.1 3.5 - 5.3 mmol/L   Chloride 106 98 - 110 mmol/L   CO2 19 (L) 20 - 31 mmol/L   Glucose, Bld 78 65 - 99 mg/dL   BUN 17 7 - 25 mg/dL   Creat 1.07 0.70 - 1.25 mg/dL   Total Bilirubin 0.7 0.2 - 1.2 mg/dL   Alkaline Phosphatase 72 40 - 115 U/L   AST 18 10 - 35 U/L   ALT 13 9 - 46 U/L   Total Protein 6.4 6.1 - 8.1 g/dL   Albumin 3.8 3.6 - 5.1 g/dL   Calcium 9.2 8.6 - 10.3 mg/dL   GFR, Est African American 86 >=60 mL/min   GFR, Est Non  African American 74 >=60 mL/min  Lipid panel  Result Value Ref Range   Cholesterol 227 (H) <200 mg/dL   Triglycerides 271 (H) <150 mg/dL   HDL 38 (L) >40 mg/dL   Total CHOL/HDL Ratio 6.0 (H) <5.0 Ratio   VLDL 54 (H) <30 mg/dL   LDL Cholesterol 135 (H) <100 mg/dL  Hemoglobin A1c  Result Value Ref Range   Hgb A1c MFr Bld 5.4 <5.7 %   Mean Plasma Glucose 108 mg/dL  CBC with Differential/Platelet  Result Value Ref Range   WBC 8.8 3.8 - 10.8 K/uL   RBC 5.15 4.20 - 5.80 MIL/uL   Hemoglobin 17.1 13.2 - 17.1 g/dL   HCT 50.8 (H) 38.5 - 50.0 %   MCV 98.6 80.0 - 100.0 fL   MCH 33.2 (H) 27.0 - 33.0 pg   MCHC 33.7 32.0 - 36.0 g/dL   RDW 14.5 11.0 - 15.0 %   Platelets 225 140 - 400 K/uL   MPV 11.8 7.5 - 12.5 fL   Neutro Abs 4,840 1,500 - 7,800 cells/uL   Lymphs Abs 2,552 850 - 3,900 cells/uL   Monocytes Absolute 704 200 - 950 cells/uL   Eosinophils Absolute 616 (H) 15 - 500 cells/uL   Basophils Absolute 88 0 - 200 cells/uL   Neutrophils Relative % 55 %   Lymphocytes Relative 29 %   Monocytes Relative 8 %   Eosinophils Relative 7 %   Basophils Relative 1 %   Smear Review Criteria for review not met   PSA, Total with Reflex to PSA, Free  Result Value Ref Range   PSA, Total 0.5 <=4.0 ng/mL      Assessment & Plan:   Problem List Items Addressed This Visit    Tobacco abuse    Quit smoking 09/14/17 Secondary to cough/bronchitis Encouraged continued cessation, can follow-up if needed, see note from before with trial on OTC NRT Patches and Quitline, he will continue these as needed       Other Visit Diagnoses    Bronchospasm with bronchitis, acute    -  Primary   Relevant Medications   predniSONE (DELTASONE) 20 MG tablet   albuterol (PROVENTIL HFA;VENTOLIN HFA) 108 (90 Base) MCG/ACT inhaler   benzonatate (TESSALON) 100 MG capsule      Consistent with second sickening recurrence of bronchitis after improved initially on antibiotic, now more concerning for possible mild COPD exac  with bronchospasm and productive cough, with smoking history likely has some reactive airways - Afebrile, no focal signs of infection (not consistent with pneumonia by history or exam), no evidence sinusitis.  Plan: 1. Start Prednisone 81m daily x 5 days burst  treatment given bronchospasm  If not improving 24-48 hours, notify office will send rx second round of antibiotics either Azithromycin Z-pak x 5 days, or alternative such as levaquin for more COPD if needed  Trial on Use Albuterol inhaler 2 puffs q 4-6 hour x 3-5 days regularly (discussed proper use, advised to ask pharmacist for demonstration) 2. Rx Tessalon Perls for cough PRN 3. Recommend trial OTC - Mucinex, Tylenol/Ibuprofen PRN, Nasal saline, lozenges, tea with honey/lemon 4. Return criteria reviewed, follow-up within 1 week if not improved - Future consider Chest X-ray if still not improving.   Meds ordered this encounter  Medications  . predniSONE (DELTASONE) 20 MG tablet    Sig: Take 2 tablets (40 mg total) by mouth daily with breakfast. For 5 days    Dispense:  10 tablet    Refill:  0  . albuterol (PROVENTIL HFA;VENTOLIN HFA) 108 (90 Base) MCG/ACT inhaler    Sig: Inhale 2 puffs into the lungs every 4 (four) hours as needed for wheezing or shortness of breath (cough).    Dispense:  1 Inhaler    Refill:  0  . benzonatate (TESSALON) 100 MG capsule    Sig: Take 1 capsule (100 mg total) by mouth 3 (three) times daily as needed for cough.    Dispense:  30 capsule    Refill:  0    Follow up plan: Return in about 1 week (around 09/27/2017), or if symptoms worsen or fail to improve, for bronchospasm.  [name], [facility] 09/20/2017, 1:03 PM

## 2017-10-07 ENCOUNTER — Other Ambulatory Visit: Payer: BLUE CROSS/BLUE SHIELD

## 2017-10-07 DIAGNOSIS — E782 Mixed hyperlipidemia: Secondary | ICD-10-CM

## 2017-10-07 DIAGNOSIS — R7303 Prediabetes: Secondary | ICD-10-CM

## 2017-10-08 LAB — LIPID PANEL
Cholesterol: 267 mg/dL — ABNORMAL HIGH (ref <200)
HDL: 38 mg/dL — ABNORMAL LOW (ref >40)
Non-HDL Cholesterol (Calc): 229 mg/dL (calc) — ABNORMAL HIGH (ref <130)
Total CHOL/HDL Ratio: 7 (calc) — ABNORMAL HIGH (ref <5.0)
Triglycerides: 410 mg/dL — ABNORMAL HIGH (ref <150)

## 2017-10-08 LAB — HEMOGLOBIN A1C
Hgb A1c MFr Bld: 5.8 % of total Hgb — ABNORMAL HIGH (ref <5.7)
Mean Plasma Glucose: 120 (calc)
eAG (mmol/L): 6.6 (calc)

## 2017-10-14 ENCOUNTER — Ambulatory Visit: Payer: BLUE CROSS/BLUE SHIELD | Admitting: Family Medicine

## 2017-10-20 ENCOUNTER — Other Ambulatory Visit: Payer: Self-pay | Admitting: Family Medicine

## 2017-10-20 ENCOUNTER — Ambulatory Visit (INDEPENDENT_AMBULATORY_CARE_PROVIDER_SITE_OTHER): Payer: BLUE CROSS/BLUE SHIELD | Admitting: Family Medicine

## 2017-10-20 ENCOUNTER — Encounter: Payer: Self-pay | Admitting: Family Medicine

## 2017-10-20 VITALS — BP 130/76 | HR 62 | Temp 97.7°F | Resp 16 | Ht 72.0 in | Wt 186.0 lb

## 2017-10-20 DIAGNOSIS — Z0000 Encounter for general adult medical examination without abnormal findings: Secondary | ICD-10-CM

## 2017-10-20 DIAGNOSIS — Z125 Encounter for screening for malignant neoplasm of prostate: Secondary | ICD-10-CM

## 2017-10-20 DIAGNOSIS — N529 Male erectile dysfunction, unspecified: Secondary | ICD-10-CM | POA: Diagnosis not present

## 2017-10-20 DIAGNOSIS — E782 Mixed hyperlipidemia: Secondary | ICD-10-CM

## 2017-10-20 DIAGNOSIS — R7303 Prediabetes: Secondary | ICD-10-CM

## 2017-10-20 DIAGNOSIS — Z87891 Personal history of nicotine dependence: Secondary | ICD-10-CM

## 2017-10-20 DIAGNOSIS — R7989 Other specified abnormal findings of blood chemistry: Secondary | ICD-10-CM

## 2017-10-20 NOTE — Patient Instructions (Addendum)
Thank you for coming to the office today.  1. Elevated Trigclyerides with cholesterol this is related to the elevated sugar A1c  Try to improve diet and exercise as discussed, portion size moderation, reduce carbs starches potatoes / beer etc  DUE for FASTING BLOOD WORK (no food or drink after midnight before the lab appointment, only water or coffee without cream/sugar on the morning of)  SCHEDULE "Lab Only" visit in the morning at the clinic for lab draw in 5 MONTHS   - Make sure Lab Only appointment is at about 1 week before your next appointment, so that results will be available  For Lab Results, once available within 2-3 days of blood draw, you can can log in to MyChart online to view your results and a brief explanation. Also, we can discuss results at next follow-up visit.    Please schedule a Follow-up Appointment to: Return in about 5 months (around 03/19/2018) for Annual Physical.    If you have any other questions or concerns, please feel free to call the office or send a message through [place]. You may also schedule an earlier appointment if necessary.  Additionally, you may be receiving a survey about your experience at our office within a few days to 1 week by e-mail or mail. We value your feedback.  [name], DO [place]

## 2017-10-20 NOTE — Assessment & Plan Note (Signed)
Elevated A1c now worsening control Pre-DM with A1c 5.8 from prior 5.4 - non adherence to diet Concern with HLD  Plan:  1. Not on any therapy currently 2. Encourage improved lifestyle - mediterranean / DASH diet, low carb, low sugar diet, reduce portion size, start regular exercise 3. Follow-up 5 months Annual + labs A1c

## 2017-10-20 NOTE — Progress Notes (Signed)
Subjective:    [name], male    DOB: 1955-12-20, 62 y.o.   MRN: [id number]  Ethan Fitzgerald is a 62 y.o. male presenting on 10/20/2017 for Hyperlipidemia (lab work)   HPI  Pre-Diabetes Reports prior concern with dx Pre-DM from prior doctor. His recent A1c trend increased on last lab check from 5.4 to 5.8. He attributes this to poor dietary habits especially now after quit smoking. Eating more chocolate, see lifestyle below CBGs: Does not check CBG Meds: Never on meds Currently not on ACEi / ARB Denies hypoglycemia, polyuria, visual changes, numbness or tingling.  HYPERLIPIDEMIA: - Reports prior history of concern was told hyperTG in past. Last lipid panel 03/2017 abnormal elevated TG >270, low HDL 38, elevated LDL 135 - Never on cholesterol medication, no statin - Tried Fish Oil in past briefly but stopped did not know if he needed to take - he takes Vegetable/Fruit vitamins daily Lifestyle - Diet: Since quit smoking, eating more chocolate and candy. Not yet reducing carbs/starches, he admits to being a "potato fiend" and still drinks some beer. He drinks more water now 4-6 bottles a day - Exercise: no change in regular exercise, still walking at work. - Weight up +3 lbs - Weight stable without significant change  Former Smoker / History of Tobacco Abuse He remains smoke free, quit 1 month ago with bronchitis episode, still smoke free, using NRT patches, on 3-4 weeks of high dose patch presumed 21mg , reduced down to 14 or step 2 but seems to have more cravings to smoke. He smoked in past for >40 years.  Follow-up Erectile Dysfunction - Last seen for this 02/2017, rx Sildenafil generic 20mg , he has tried this with good results taking 3-4 pills per dose, he thinks he has a 10mg  pill and asking about 20mg , he went to [facility], has refills left will check on mg   Update - URI / bronchitis has resolved in interval since last visit  Depression screen [facility] 2/9  10/20/2017 02/18/2017  Decreased Interest 0 0  Down, Depressed, Hopeless 0 0  PHQ - 2 Score 0 0    Social History   Tobacco Use  . Smoking status: Former Smoker    Packs/day: 0.25    Years: 40.00    Pack years: 10.00    Types: Cigarettes    Last attempt to quit: 09/14/2017    Years since quitting: 0.0  . Smokeless tobacco: Former Network engineer Use Topics  . Alcohol use: Yes    Comment: 6 pack on weekend  . Drug use: No    Review of Systems Per HPI unless specifically indicated above     Objective:    BP 130/76   Pulse 62   Temp 97.7 F (36.5 C) (Oral)   Resp 16   Ht 6' (1.829 m)   Wt 186 lb (84.4 kg)   BMI 25.23 kg/m   Wt Readings from Last 3 Encounters:  10/20/17 186 lb (84.4 kg)  09/20/17 183 lb 6.4 oz (83.2 kg)  08/26/17 180 lb 9.6 oz (81.9 kg)    Physical Exam  Constitutional: He is oriented to person, place, and time. He appears well-developed and well-nourished. No distress.  Well-appearing, comfortable, cooperative  HENT:  Head: Normocephalic and atraumatic.  Mouth/Throat: Oropharynx is clear and moist.  Eyes: Conjunctivae are normal. Right eye exhibits no discharge. Left eye exhibits no discharge.  Neck: Normal range of motion. Neck supple. No thyromegaly present.  Cardiovascular: Normal  rate, regular rhythm, normal heart sounds and intact distal pulses.  No murmur heard. Pulmonary/Chest: Effort normal and breath sounds normal. No respiratory distress. He has no wheezes. He has no rales.  Musculoskeletal: Normal range of motion. He exhibits no edema.  Lymphadenopathy:    He has no cervical adenopathy.  Neurological: He is alert and oriented to person, place, and time.  Skin: Skin is warm and dry. No rash noted. He is not diaphoretic. No erythema.  Psychiatric: He has a normal mood and affect. His behavior is normal.  Well groomed, good eye contact, normal speech and thoughts  Nursing note and vitals reviewed.  Results for orders placed or performed  in visit on 10/07/17  Hemoglobin A1c  Result Value Ref Range   Hgb A1c MFr Bld 5.8 (H) <5.7 % of total Hgb   Mean Plasma Glucose 120 (calc)   eAG (mmol/L) 6.6 (calc)  Lipid panel  Result Value Ref Range   Cholesterol 267 (H) <200 mg/dL   HDL 38 (L) >40 mg/dL   Triglycerides 410 (H) <150 mg/dL   LDL Cholesterol (Calc)  mg/dL (calc)   Total CHOL/HDL Ratio 7.0 (H) <5.0 (calc)   Non-HDL Cholesterol (Calc) 229 (H) <130 mg/dL (calc)      Assessment & Plan:   Problem List Items Addressed This Visit    Erectile dysfunction    Improved on PDE sildenafil Check existing rx, advised him it should be 20mg  not 10mg , I do not see a 10mg  pill available If needs more refills or high pill # can notify office      Former smoker    Encouraged to stay smoke free Continue NRT patches Advised may need longer 1-3 more weeks at high dose for max 6 weeks, or can alternate dosing days 21 to 14 mg patch for 1-2 weeks taper down to lower dose      Hyperlipidemia - Primary    Uncontrolled cholesterol with sub-optimal lifestyle. Not on statin Last lipid panel 10/2017 - still elevated LDL, TG now >400 Calculated ASCVD 10 yr risk score down to 13-15% now former smoker  Plan: 1. Again discussed ASCVD risk - advised that we can reconsider statin in future - Start with Fish Oil OTC 1g BID wc - may inc to 2 pills per dose max  - to help lower TG 2. Encourage improved lifestyle - low carb/cholesterol emphasized DASH / Mediterranean diet options, reduce portion size, start regular exercise 3. Follow-up 5 months re-check fasting lipids annual      Pre-diabetes    Elevated A1c now worsening control Pre-DM with A1c 5.8 from prior 5.4 - non adherence to diet Concern with HLD  Plan:  1. Not on any therapy currently 2. Encourage improved lifestyle - mediterranean / DASH diet, low carb, low sugar diet, reduce portion size, start regular exercise 3. Follow-up 5 months Annual + labs A1c         No orders of  the defined types were placed in this encounter.    Follow up plan: Return in about 5 months (around 03/19/2018) for Annual Physical.  Future labs ordered for 03/16/18  [name], [facility] 10/20/2017, 9:35 AM

## 2017-10-20 NOTE — Assessment & Plan Note (Signed)
Encouraged to stay smoke free Continue NRT patches Advised may need longer 1-3 more weeks at high dose for max 6 weeks, or can alternate dosing days 21 to 14 mg patch for 1-2 weeks taper down to lower dose

## 2017-10-20 NOTE — Assessment & Plan Note (Signed)
Uncontrolled cholesterol with sub-optimal lifestyle. Not on statin Last lipid panel 10/2017 - still elevated LDL, TG now >400 Calculated ASCVD 10 yr risk score down to 13-15% now former smoker  Plan: 1. Again discussed ASCVD risk - advised that we can reconsider statin in future - Start with Fish Oil OTC 1g BID wc - may inc to 2 pills per dose max  - to help lower TG 2. Encourage improved lifestyle - low carb/cholesterol emphasized DASH / Mediterranean diet options, reduce portion size, start regular exercise 3. Follow-up 5 months re-check fasting lipids annual

## 2017-10-20 NOTE — Assessment & Plan Note (Signed)
Improved on PDE sildenafil Check existing rx, advised him it should be 20mg  not 10mg , I do not see a 10mg  pill available If needs more refills or high pill # can notify office

## 2017-11-29 ENCOUNTER — Ambulatory Visit
Admission: RE | Admit: 2017-11-29 | Discharge: 2017-11-29 | Disposition: A | Discharge status: Home / Self Care | Payer: BLUE CROSS/BLUE SHIELD | Source: Ambulatory Visit | Attending: Family Medicine | Admitting: Family Medicine

## 2017-11-29 ENCOUNTER — Ambulatory Visit (INDEPENDENT_AMBULATORY_CARE_PROVIDER_SITE_OTHER): Payer: BLUE CROSS/BLUE SHIELD | Admitting: Family Medicine

## 2017-11-29 ENCOUNTER — Encounter: Payer: Self-pay | Admitting: Family Medicine

## 2017-11-29 VITALS — BP 119/68 | HR 109 | Temp 97.7°F | Resp 16 | Ht 72.0 in | Wt 177.0 lb

## 2017-11-29 DIAGNOSIS — R6883 Chills (without fever): Secondary | ICD-10-CM

## 2017-11-29 DIAGNOSIS — R0602 Shortness of breath: Secondary | ICD-10-CM | POA: Diagnosis not present

## 2017-11-29 DIAGNOSIS — J101 Influenza due to other identified influenza virus with other respiratory manifestations: Secondary | ICD-10-CM

## 2017-11-29 DIAGNOSIS — R918 Other nonspecific abnormal finding of lung field: Secondary | ICD-10-CM | POA: Insufficient documentation

## 2017-11-29 LAB — POCT INFLUENZA A/B
Influenza A, POC: POSITIVE — AB
Influenza B, POC: NEGATIVE

## 2017-11-29 MED ORDER — OSELTAMIVIR PHOSPHATE 75 MG PO CAPS
75.0000 mg | ORAL_CAPSULE | Freq: Two times a day (BID) | ORAL | 0 refills | Status: DC
Start: 2017-11-29 — End: 2017-12-08

## 2017-11-29 MED ORDER — BENZONATATE 100 MG PO CAPS: 100.0000 mg | ORAL_CAPSULE | Freq: Three times a day (TID) | ORAL | 0 refills | Status: DC | PRN

## 2017-11-29 MED ORDER — PREDNISONE 50 MG PO TABS: 50.0000 mg | ORAL_TABLET | Freq: Every day | ORAL | 0 refills | Status: DC

## 2017-11-29 NOTE — Progress Notes (Signed)
Subjective:    [name], male    DOB: [birth year], 62 y.o.   MRN: [id number]  Ethan Fitzgerald is a 62 y.o. male presenting on 11/29/2017 for Fatigue and Cough (onset 4 days chest congestion)  Patient presents for a same day appointment.   HPI  INFLUENZA A Reports sick contact with wife dx with influenza recently. His symptoms started 4 days ago with feeling fatigue general malaise and body aches, over weekend he had some episodes of feeling confused and "out of it", seemed to improve by Monday, still very tired, had difficulty resting due to symptoms, some temperature instability but no fever measured - Tried some OTC medicine, not taking any anti-pyretrics - History of chronic smoker - Admits fatigue, tired, dyspnea, shortness of breath, body aches - Denies nausea vomiting, diarrhea, abdominal pain, chest pain  Health Maintenance: Declined Flu vaccine this season   Depression screen [facility] 2/9 10/20/2017 02/18/2017  Decreased Interest 0 0  Down, Depressed, Hopeless 0 0  PHQ - 2 Score 0 0    Social History   Tobacco Use  . Smoking status: Former Smoker    Packs/day: 0.25    Years: 40.00    Pack years: 10.00    Types: Cigarettes    Last attempt to quit: 09/14/2017    Years since quitting: 0.2  . Smokeless tobacco: Former Network engineer Use Topics  . Alcohol use: Yes    Comment: 6 pack on weekend  . Drug use: No    Review of Systems Per HPI unless specifically indicated above     Objective:    BP 119/68   Pulse (!) 109   Temp 97.7 F (36.5 C) (Oral)   Resp 16   Ht 6' (1.829 m)   Wt 177 lb (80.3 kg)   SpO2 97%   BMI 24.01 kg/m   Wt Readings from Last 3 Encounters:  11/29/17 177 lb (80.3 kg)  10/20/17 186 lb (84.4 kg)  09/20/17 183 lb 6.4 oz (83.2 kg)    Physical Exam  Constitutional: He is oriented to person, place, and time. He appears well-developed and well-nourished. No distress.  Ill appearing and tired, cooperative  HENT:  Head:  Normocephalic and atraumatic.  Mouth/Throat: Oropharynx is clear and moist.  Eyes: Conjunctivae are normal. Right eye exhibits no discharge. Left eye exhibits no discharge.  Neck: Normal range of motion. Neck supple.  Cardiovascular: Regular rhythm, normal heart sounds and intact distal pulses.  No murmur heard. Tachycardic  Pulmonary/Chest: He has no wheezes. He has no rales.  Mild increased work of breathing, occasional cough, some mild coarse sounds with reduced air movement diffusely, non focal. No crackles or wheezing.  Musculoskeletal: Normal range of motion. He exhibits no edema.  Lymphadenopathy:    He has no cervical adenopathy.  Neurological: He is alert and oriented to person, place, and time.  Skin: Skin is warm and dry. No rash noted. He is not diaphoretic. No erythema.  Psychiatric: His behavior is normal.  Nursing note and vitals reviewed.  I have personally reviewed the radiology report from 11/29/17 on STAT Chest X-ray.  Narrative    CLINICAL DATA: Positive test for influenza A. Shortness of breath.  EXAM: CHEST - 2 VIEW  COMPARISON: 08/15/2009  FINDINGS: The cardiomediastinal silhouette is unchanged and within normal limits. The lungs are hyperinflated with new peribronchial thickening and coarsening of the interstitial markings diffusely. No confluent airspace opacity, pleural effusion, or pneumothorax is identified. No acute  osseous abnormality is seen.  IMPRESSION: Chronic hyperinflation with new peribronchial thickening which may reflect progressive chronic bronchitic changes in the setting of COPD versus superimposed atypical infection.   Electronically Signed By: Logan Bores M.D. On: 11/29/2017 15:03     Results for orders placed or performed in visit on 11/29/17  POCT Influenza A/B  Result Value Ref Range   Influenza A, POC Positive (A) Negative   Influenza B, POC Negative Negative      Assessment & Plan:   Problem List Items  Addressed This Visit    None    Visit Diagnoses    Influenza A    -  Primary   Relevant Medications   benzonatate (TESSALON) 100 MG capsule   predniSONE (DELTASONE) 50 MG tablet   oseltamivir (TAMIFLU) 75 MG capsule   Other Relevant Orders   POCT Influenza A/B (Completed)   DG Chest 2 View (Completed)   Chills       Relevant Orders   POCT Influenza A/B (Completed)   Shortness of breath       Relevant Medications   predniSONE (DELTASONE) 50 MG tablet      Clinically consistent with flu and confirmed Influenza A today on rapid flu test, with sick contact positive flu. - Duration x 4 days, without complication. Tolerating PO and well hydrated - No other focal findings of infection today - Did not receive influenza vaccine this season  Plan: - Check STAT CXR 1. Start Tamiflu 75mg  capsules BID x 5 days,  2. Supportive care as advised with NSAID / Tylenol PRN fever/myalgias, improve hydration, may take OTC Cold/Flu meds 3. Start Tessalon Perls take 1 capsule up to 3 times a day as needed for cough - Add Prednisone 50mg  daily due to respiratory status and x-ray shows bronchitis vs COPD - Use Albuterol inhaler 4. Return criteria given if significant worsening, consider post-influenza complications, otherwise follow-up if needed  *Update 11/29/17 - 530pm - after review of STAT CXR results showed some hyperinflation chronic bronchitic changes vs possible atypical infection, questionable if this is pneumonia or secondary to current COPD/Flu - I attempted to call him to review results, left voice mail for him to call back, requested that our staff call him tomorrow Weds 3/27 to check on his clinical symptoms, if not improving breathing, fatigue, fever then would add an oral antibiotic, or may consider he go to Hospital ED. If he is improving, will continue to treat with Prednisone / Tamiflu at this time.  Meds ordered this encounter  Medications  . benzonatate (TESSALON) 100 MG capsule     Sig: Take 1 capsule (100 mg total) by mouth 3 (three) times daily as needed for cough.    Dispense:  30 capsule    Refill:  0  . predniSONE (DELTASONE) 50 MG tablet    Sig: Take 1 tablet (50 mg total) by mouth daily with breakfast.    Dispense:  5 tablet    Refill:  0  . oseltamivir (TAMIFLU) 75 MG capsule    Sig: Take 1 capsule (75 mg total) by mouth 2 (two) times daily. For 5 days    Dispense:  10 capsule    Refill:  0    Follow up plan: Return in about 1 week (around 12/06/2017), or if symptoms worsen or fail to improve, for flu.  [name], [facility] 11/30/2017, 1:16 AM

## 2017-11-29 NOTE — Patient Instructions (Addendum)
Thank you for coming to the office today.  1. You tested positive for Influenza A today (rapid flu swab test in office) - Start Tamiflu (anti-flu medicine) take one capsule 75mg  twice a day for 5 days - - Wash hands and cover cough very well to avoid spread of infection  Check Chest X-ray today stay tuned for results will call if any concerns  - For symptom control:  Start Prednisone 50mg  daily with food for 5 days       - Take Ibuprofen / Advil 400-600mg  every 6-8 hours as needed for fever / muscle aches, and may also take Tylenol 500-1000mg  per dose every 6-8 hours or 3 times a day, can alternate dosing      - Start Tessalon perls one every 8 hours or 3 times a day as needed for cough      - Start OTC Mucinex-DM for cough and congestion for up to 7 days - Improve hydration with plenty of clear fluids  If significant worsening with poor fluid intake, worsening fever, difficulty breathing due to coughing, worsening body aches, weakness, or other more concerning symptoms difficulty breathing you can seek treatment at Emergency Department. Also if improved flu symptoms and then worsening days to week later with concerns for bronchitis, productive cough fever chills again we may need to check for possible pneumonia that can occur after the flu   Please schedule a Follow-up Appointment to: Return in about 1 week (around 12/06/2017), or if symptoms worsen or fail to improve, for flu.  If you have any other questions or concerns, please feel free to call the office or send a message through [place]. You may also schedule an earlier appointment if necessary.  Additionally, you may be receiving a survey about your experience at our office within a few days to 1 week by e-mail or mail. We value your feedback.  [name], DO [place]

## 2017-11-30 ENCOUNTER — Telehealth: Payer: Self-pay | Admitting: Family Medicine

## 2017-11-30 ENCOUNTER — Encounter: Payer: Self-pay | Admitting: Family Medicine

## 2017-11-30 NOTE — Telephone Encounter (Signed)
See note, seen 11/29/17, dx influenza, had CXR given respiratory difficulty, treated with tamiflu and prednisone, CXR did show some COPD changes more chronic and concern new bronchitis changes vs possible underlying atypical infection, patient called back, I spoke to him today 3/27 to review x-ray results, he feels about same not worse today only 24 hour on medicine, advised him to monitor symptoms closely same treatment plan, no change today, if not improved or worse by Friday 3/29 he should contact us back and we can send an antibiotic into pharmacy, otherwise he should seek more immediate care if acute worsening go to hospital ED  [name], [place] 11/30/2017, 12:44 PM

## 2017-12-05 ENCOUNTER — Telehealth: Payer: Self-pay | Admitting: Family Medicine

## 2017-12-05 DIAGNOSIS — Z87891 Personal history of nicotine dependence: Secondary | ICD-10-CM

## 2017-12-05 DIAGNOSIS — J449 Chronic obstructive pulmonary disease, unspecified: Secondary | ICD-10-CM

## 2017-12-05 NOTE — Telephone Encounter (Signed)
Pt asked for referral to pulmonologist.  His call back number is [phone]

## 2017-12-05 NOTE — Telephone Encounter (Signed)
Recently seen in office for bronchitis with bronchospasm in 09/2017 and then seen in 11/2017 for influenza A positive with worsening respiratory concerns, he has had x-ray and is a chronic smoker >40 years, now recently quit with respiratory issues, he has presumed history of COPD, including findings on x-ray.  Referral is requested to Pulmonology. Will send to [facility] as mentioned at our last visit.  [name], [facility] 12/05/2017, 12:12 PM

## 2017-12-05 NOTE — Telephone Encounter (Signed)
Pt is aware and a number was given for [facility] as per patient request.

## 2017-12-07 NOTE — Progress Notes (Signed)
[facility] Consultation      Assessment and Plan:  Acute bronchitis with acute exacerbation of COPD. Chronic bronchitis, with elevated absolute eosinophil count rater than 600 Recent smoker. -Physician will start a prednisone taper, Brio inhaler demonstrated use, will start narcotic cough syrup to help him sleep better at night due to his intractable cough, I have also increased his Tessalon to 200 mg 3 times daily. - Will consider PFT once patient has recovered.  Meds ordered this encounter  Medications  . predniSONE (STERAPRED UNI-PAK 21 TAB) 10 MG (21) TBPK tablet    Sig: Take as directed.    Dispense:  21 tablet    Refill:  0  . doxycycline (VIBRAMYCIN) 100 MG capsule    Sig: Take 1 capsule (100 mg total) by mouth 2 (two) times daily.    Dispense:  10 capsule    Refill:  0  . benzonatate (TESSALON PERLES) 100 MG capsule    Sig: Take 2 capsules (200 mg total) by mouth 3 (three) times daily.    Dispense:  90 capsule    Refill:  2  . fluticasone furoate-vilanterol (BREO ELLIPTA) 200-25 MCG/INH AEPB    Sig: Inhale 1 puff into the lungs daily. Rinse mouth after use.    Dispense:  1 each    Refill:  5  . HYDROcodone-acetaminophen (HYCET) 7.5-325 mg/15 ml solution    Sig: Take 10 mLs by mouth at bedtime. Do not drive after taking this medication.    Dispense:  120 mL    Refill:  0   Return in about 1 month (around 01/05/2018).    Date: 12/08/2017  MRN# [id number] Ethan Fitzgerald 1955/09/27    Ethan Fitzgerald is a 62 y.o. old male seen in consultation for chief complaint of:    Chief Complaint  Patient presents with  . Consult    Referred by Dr.Karamalegos: COPD  . Cough    pt had Flu 2 weeks ago and cough has lingered  . Shortness of Breath    with exertion and cough  . Wheezing    HPI:   Patient has a history of chronic bronchitis/COPD. He got flu symptoms about 2 weeks ago, he was positive for flu, he was treated with prednisone, and cough  medicine. This has never happened to him before. He has lost about 10 lbs over 2 weeks.  He was smoking until about 3 months ago. He has never been diagnosed with COPD or respiratory problems in the past. He is taking albuterol about every 2-4 hours. He has a coughing jag every few hours and then he can not breathe.   He has had a upper respiratory tract infections once yearly over the past 2 years, including a recent episode of influenza last month. He has no pets, denies reflux or sinus drainage.  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   **Imaging personally reviewed, 11/29/17; there is hyperinflation, changes of chronic bronchitis.  Overall changes are consistent with chronic bronchitis/emphysema.  Chronic bronchitis changes have advanced slightly from previous film in 2010. **CBC 03/15/17; abs eos=616.  PMHX:   Past Medical History:  Diagnosis Date  . Erectile dysfunction   . Headache    daily, stress headaches  . Knee pain, left   . Seasonal allergies   . Tobacco dependence    Surgical Hx:  Past Surgical History:  Procedure Laterality Date  . BACK SURGERY     metal plate in back  .  COLONOSCOPY    . HERNIA REPAIR Right   . KNEE ARTHROSCOPY Left 03/28/2015   Procedure: Arthroscopic partial medial meniscectomy plus chondral debridement;  Surgeon: Leanor Kail, MD;  Location: [facility];  Service: Orthopedics;  Laterality: Left;   Family Hx:  Family History  Problem Relation Age of Onset  . Diabetes Mother   . Heart attack Mother 93  . Cancer Father        liver   . COPD Brother   . HIV/AIDS Brother    Social Hx:   Social History   Tobacco Use  . Smoking status: Former Smoker    Packs/day: 0.25    Years: 40.00    Pack years: 10.00    Types: Cigarettes    Last attempt to quit: 09/14/2017    Years since quitting: 0.2  . Smokeless tobacco: Former Network engineer Use Topics  . Alcohol use: Yes    Comment: 6 pack on weekend  . Drug use: No     Medication:    Current Outpatient Medications:  .  albuterol (PROVENTIL HFA;VENTOLIN HFA) 108 (90 Base) MCG/ACT inhaler, Inhale 2 puffs into the lungs every 4 (four) hours as needed for wheezing or shortness of breath (cough)., Disp: 1 Inhaler, Rfl: 0 .  Aspirin-Acetaminophen-Caffeine (EXCEDRIN PO), Take 2 tablets by mouth. am, Disp: , Rfl:  .  benzonatate (TESSALON) 100 MG capsule, Take 1 capsule (100 mg total) by mouth 3 (three) times daily as needed for cough., Disp: 30 capsule, Rfl: 0 .  sildenafil (REVATIO) 20 MG tablet, Take 1-5 pills about 30 min prior to sex. Start with 1 and increase as needed., Disp: 30 tablet, Rfl: 2 .  vitamin C (ASCORBIC ACID) 500 MG tablet, Take 500 mg by mouth daily. Am, Disp: , Rfl:    Allergies:  Patient has no known allergies.  Review of Systems: Gen:  Denies  fever, sweats, chills HEENT: Denies blurred vision, double vision. bleeds, sore throat Cvc:  No dizziness, chest pain. Resp:   Denies cough or sputum production, shortness of breath Gi: Denies swallowing difficulty, stomach pain. Gu:  Denies bladder incontinence, burning urine Ext:   No Joint pain, stiffness. Skin: No skin rash,  hives  Endoc:  No polyuria, polydipsia. Psych: No depression, insomnia. Other:  All other systems were reviewed with the patient and were negative other that what is mentioned in the HPI.   Physical Examination:   VS: BP 110/70 (BP Location: Left Arm, Cuff Size: Normal)   Pulse 97   Resp 18   Ht 6' (1.829 m)   Wt 174 lb (78.9 kg)   SpO2 93%   BMI 23.60 kg/m   General Appearance: No distress  Neuro:without focal findings,  speech normal,  HEENT: PERRLA, EOM intact.   Pulmonary: normal breath sounds, No wheezing.  Decreased air entry bilaterally. CardiovascularNormal S1,S2.  No m/r/g.   Abdomen: Benign, Soft, non-tender. Renal:  No costovertebral tenderness  GU:  No performed at this time. Endoc: No evident thyromegaly, no signs of acromegaly. Skin:    warm, no rashes, no ecchymosis  Extremities: normal, no cyanosis, clubbing.  Other findings:    LABORATORY PANEL:   CBC No results for input(s): WBC, HGB, HCT, PLT in the last 168 hours. ------------------------------------------------------------------------------------------------------------------  Chemistries  No results for input(s): NA, K, CL, CO2, GLUCOSE, BUN, CREATININE, CALCIUM, MG, AST, ALT, ALKPHOS, BILITOT in the last 168 hours.  Invalid input(s): GFRCGP ------------------------------------------------------------------------------------------------------------------  Cardiac Enzymes No results for input(s): TROPONINI in the  last 168 hours. ------------------------------------------------------------  RADIOLOGY:  No results found.     Thank  you for the consultation and for allowing [facility] to assist in the care of your patient. Our recommendations are noted above.  Please contact us if we can be of further service.   Marda Stalker, MD.  Board Certified in Internal Medicine, Pulmonary Medicine, [place], and Sleep Medicine.  [facility] Office Number: [phone]  Patricia Pesa, M.D.  Merton Border, M.D  12/08/2017

## 2017-12-08 ENCOUNTER — Encounter: Payer: Self-pay | Admitting: Internal Medicine

## 2017-12-08 ENCOUNTER — Ambulatory Visit: Payer: BLUE CROSS/BLUE SHIELD | Admitting: Internal Medicine

## 2017-12-08 VITALS — BP 110/70 | HR 97 | Resp 18 | Ht 72.0 in | Wt 174.0 lb

## 2017-12-08 DIAGNOSIS — R059 Cough, unspecified: Secondary | ICD-10-CM

## 2017-12-08 DIAGNOSIS — J209 Acute bronchitis, unspecified: Secondary | ICD-10-CM | POA: Diagnosis not present

## 2017-12-08 DIAGNOSIS — R05 Cough: Secondary | ICD-10-CM | POA: Diagnosis not present

## 2017-12-08 DIAGNOSIS — J440 Chronic obstructive pulmonary disease with acute lower respiratory infection: Secondary | ICD-10-CM

## 2017-12-08 DIAGNOSIS — J441 Chronic obstructive pulmonary disease with (acute) exacerbation: Secondary | ICD-10-CM | POA: Diagnosis not present

## 2017-12-08 MED ORDER — DOXYCYCLINE HYCLATE 100 MG PO CAPS: 100.0000 mg | ORAL_CAPSULE | Freq: Two times a day (BID) | ORAL | 0 refills | Status: DC

## 2017-12-08 MED ORDER — FLUTICASONE FUROATE-VILANTEROL 100-25 MCG/INH IN AEPB: 1.0000 | INHALATION_SPRAY | Freq: Every day | RESPIRATORY_TRACT | 0 refills | Status: DC

## 2017-12-08 MED ORDER — BENZONATATE 100 MG PO CAPS: 200.0000 mg | ORAL_CAPSULE | Freq: Three times a day (TID) | ORAL | 2 refills | Status: DC

## 2017-12-08 MED ORDER — HYDROCODONE-ACETAMINOPHEN 7.5-325 MG/15ML PO SOLN: 10.0000 mL | Freq: Every day | ORAL | 0 refills | Status: DC

## 2017-12-08 MED ORDER — FLUTICASONE FUROATE-VILANTEROL 200-25 MCG/INH IN AEPB: 1.0000 | INHALATION_SPRAY | Freq: Every day | RESPIRATORY_TRACT | 5 refills | Status: DC

## 2017-12-08 MED ORDER — PREDNISONE 10 MG (21) PO TBPK: ORAL_TABLET | ORAL | 0 refills | Status: DC

## 2017-12-08 NOTE — Addendum Note (Signed)
Addended by: Stephanie Coup on: 12/08/2017 09:49 AM   Modules accepted: Orders

## 2017-12-08 NOTE — Patient Instructions (Addendum)
Your official diagnosis is acute bronchitis, COPD exacerbation.  Will start you on an inhaler and prednisone and a cough syrup.  Will increase Tessalon to 2 pills 3 times daily We will start a narcotic cough syrup, to be used at bedtime only.

## 2018-01-09 ENCOUNTER — Encounter: Payer: Self-pay | Admitting: Internal Medicine

## 2018-01-09 ENCOUNTER — Ambulatory Visit: Payer: BLUE CROSS/BLUE SHIELD | Admitting: Internal Medicine

## 2018-01-09 VITALS — BP 118/84 | HR 79 | Resp 16 | Ht 72.0 in | Wt 182.0 lb

## 2018-01-09 DIAGNOSIS — R059 Cough, unspecified: Secondary | ICD-10-CM

## 2018-01-09 DIAGNOSIS — J209 Acute bronchitis, unspecified: Secondary | ICD-10-CM

## 2018-01-09 DIAGNOSIS — R05 Cough: Secondary | ICD-10-CM | POA: Diagnosis not present

## 2018-01-09 DIAGNOSIS — J440 Chronic obstructive pulmonary disease with acute lower respiratory infection: Secondary | ICD-10-CM

## 2018-01-09 NOTE — Progress Notes (Signed)
[facility] Consultation      Assessment and Plan:  Acute bronchitis with acute exacerbation of COPD-resolved. Chronic bronchitis, with elevated absolute eosinophil count rater than 600-resolved Recent smoker. -Patient symptoms have resolved, his cough has resolved.   Return if symptoms worsen or fail to improve.    Date: 01/09/2018  MRN# [id number] Ethan Fitzgerald 01/08/1956    Ethan Fitzgerald is a 62 y.o. old male seen in consultation for chief complaint of:    Chief Complaint  Patient presents with  . Cough    cough has resolved  . Shortness of Breath    only with exertion    HPI:  The patient is a 62 year old male, recently quit smoking, last visit was seen about 4 weeks ago with persistent cough, which followed a episode of flu and bronchitis.  He was thought to have acute bronchitis, was noted to have mild peripheral eosinophilia.  He was treated with Breo, narcotic cough syrup, Tessalon, prednisone taper.  He feels that the cough is now resolved, he is no longer using the the tessalon. He used the [employer], and the prescription was too expensive. He has no pets at home. Deneis reflux, has occasional sinus drainage. He takes allegra in the am which helps.   He is still not smoking.   **Imaging personally reviewed, 11/29/17; there is hyperinflation, changes of chronic bronchitis.  Overall changes are consistent with chronic bronchitis/emphysema.  Chronic bronchitis changes have advanced slightly from previous film in 2010. **CBC 03/15/17; abs eos=616. Medication:    Current Outpatient Medications:  .  albuterol (PROVENTIL HFA;VENTOLIN HFA) 108 (90 Base) MCG/ACT inhaler, Inhale 2 puffs into the lungs every 4 (four) hours as needed for wheezing or shortness of breath (cough)., Disp: 1 Inhaler, Rfl: 0 .  Aspirin-Acetaminophen-Caffeine (EXCEDRIN PO), Take 2 tablets by mouth. am, Disp: , Rfl:  .  benzonatate (TESSALON PERLES) 100 MG capsule, Take 2  capsules (200 mg total) by mouth 3 (three) times daily., Disp: 90 capsule, Rfl: 2 .  benzonatate (TESSALON) 100 MG capsule, Take 1 capsule (100 mg total) by mouth 3 (three) times daily as needed for cough., Disp: 30 capsule, Rfl: 0 .  doxycycline (VIBRAMYCIN) 100 MG capsule, Take 1 capsule (100 mg total) by mouth 2 (two) times daily., Disp: 10 capsule, Rfl: 0 .  fluticasone furoate-vilanterol (BREO ELLIPTA) 100-25 MCG/INH AEPB, Inhale 1 puff into the lungs daily., Disp: 28 each, Rfl: 0 .  fluticasone furoate-vilanterol (BREO ELLIPTA) 200-25 MCG/INH AEPB, Inhale 1 puff into the lungs daily. Rinse mouth after use., Disp: 1 each, Rfl: 5 .  HYDROcodone-acetaminophen (HYCET) 7.5-325 mg/15 ml solution, Take 10 mLs by mouth at bedtime. Do not drive after taking this medication., Disp: 120 mL, Rfl: 0 .  predniSONE (STERAPRED UNI-PAK 21 TAB) 10 MG (21) TBPK tablet, Take as directed., Disp: 21 tablet, Rfl: 0 .  sildenafil (REVATIO) 20 MG tablet, Take 1-5 pills about 30 min prior to sex. Start with 1 and increase as needed., Disp: 30 tablet, Rfl: 2 .  vitamin C (ASCORBIC ACID) 500 MG tablet, Take 500 mg by mouth daily. Am, Disp: , Rfl:    Allergies:  Patient has no known allergies.  Review of Systems: Gen:  Denies  fever, sweats, chills HEENT: Denies blurred vision, double vision. bleeds, sore throat Cvc:  No dizziness, chest pain. Resp:   Denies cough or sputum production, shortness of breath Gi: Denies swallowing difficulty, stomach pain. Gu:  Denies bladder incontinence, burning urine Ext:  No Joint pain, stiffness. Skin: No skin rash,  hives  Endoc:  No polyuria, polydipsia. Psych: No depression, insomnia. Other:  All other systems were reviewed with the patient and were negative other that what is mentioned in the HPI.   Physical Examination:   VS: There were no vitals taken for this visit.  General Appearance: No distress  Neuro:without focal findings,  speech normal,  HEENT: PERRLA, EOM  intact.   Pulmonary: normal breath sounds, No wheezing.  Decreased air entry bilaterally. CardiovascularNormal S1,S2.  No m/r/g.   Abdomen: Benign, Soft, non-tender. Renal:  No costovertebral tenderness  GU:  No performed at this time. Endoc: No evident thyromegaly, no signs of acromegaly. Skin:   warm, no rashes, no ecchymosis  Extremities: normal, no cyanosis, clubbing.  Other findings:    LABORATORY PANEL:   CBC No results for input(s): WBC, HGB, HCT, PLT in the last 168 hours. ------------------------------------------------------------------------------------------------------------------  Chemistries  No results for input(s): NA, K, CL, CO2, GLUCOSE, BUN, CREATININE, CALCIUM, MG, AST, ALT, ALKPHOS, BILITOT in the last 168 hours.  Invalid input(s): GFRCGP ------------------------------------------------------------------------------------------------------------------  Cardiac Enzymes No results for input(s): TROPONINI in the last 168 hours. ------------------------------------------------------------  RADIOLOGY:  No results found.     Thank  you for the consultation and for allowing [facility] to assist in the care of your patient. Our recommendations are noted above.  Please contact us if we can be of further service.   Marda Stalker, MD.  Board Certified in Internal Medicine, Pulmonary Medicine, [place], and Sleep Medicine.  [facility] Office Number: [phone]  Patricia Pesa, M.D.  Merton Border, M.D  01/09/2018

## 2018-01-09 NOTE — Patient Instructions (Signed)
Follow up as needed. Quitting smoking was the best thing that you could do for your health.

## 2018-02-14 ENCOUNTER — Other Ambulatory Visit: Payer: Self-pay | Admitting: Family Medicine

## 2018-02-14 DIAGNOSIS — N529 Male erectile dysfunction, unspecified: Secondary | ICD-10-CM

## 2018-03-16 ENCOUNTER — Other Ambulatory Visit: Payer: 59

## 2018-03-16 DIAGNOSIS — E782 Mixed hyperlipidemia: Secondary | ICD-10-CM

## 2018-03-16 DIAGNOSIS — R7303 Prediabetes: Secondary | ICD-10-CM

## 2018-03-16 DIAGNOSIS — N529 Male erectile dysfunction, unspecified: Secondary | ICD-10-CM

## 2018-03-16 DIAGNOSIS — Z125 Encounter for screening for malignant neoplasm of prostate: Secondary | ICD-10-CM

## 2018-03-16 DIAGNOSIS — R7989 Other specified abnormal findings of blood chemistry: Secondary | ICD-10-CM

## 2018-03-16 DIAGNOSIS — Z0000 Encounter for general adult medical examination without abnormal findings: Secondary | ICD-10-CM

## 2018-03-17 LAB — COMPLETE METABOLIC PANEL WITH GFR
AG Ratio: 1.4 (calc) (ref 1.0–2.5)
ALT: 10 U/L (ref 9–46)
AST: 14 U/L (ref 10–35)
Albumin: 3.9 g/dL (ref 3.6–5.1)
Alkaline phosphatase (APISO): 71 U/L (ref 40–115)
BUN: 13 mg/dL (ref 7–25)
CO2: 26 mmol/L (ref 20–32)
Calcium: 9.8 mg/dL (ref 8.6–10.3)
Chloride: 106 mmol/L (ref 98–110)
Creat: 1.01 mg/dL (ref 0.70–1.25)
GFR, Est African American: 91 mL/min/{1.73_m2} (ref >=60)
GFR, Est Non African American: 79 mL/min/{1.73_m2} (ref >=60)
Globulin: 2.8 g/dL (calc) (ref 1.9–3.7)
Glucose, Bld: 104 mg/dL — ABNORMAL HIGH (ref 65–99)
Potassium: 4.6 mmol/L (ref 3.5–5.3)
Sodium: 139 mmol/L (ref 135–146)
Total Bilirubin: 0.9 mg/dL (ref 0.2–1.2)
Total Protein: 6.7 g/dL (ref 6.1–8.1)

## 2018-03-17 LAB — CBC WITH DIFFERENTIAL/PLATELET
Basophils Absolute: 72 cells/uL (ref 0–200)
Basophils Relative: 1.1 %
Eosinophils Absolute: 494 cells/uL (ref 15–500)
Eosinophils Relative: 7.6 %
HCT: 50.3 % — ABNORMAL HIGH (ref 38.5–50.0)
Hemoglobin: 16.7 g/dL (ref 13.2–17.1)
Lymphs Abs: 2204 cells/uL (ref 850–3900)
MCH: 31.9 pg (ref 27.0–33.0)
MCHC: 33.2 g/dL (ref 32.0–36.0)
MCV: 96 fL (ref 80.0–100.0)
MPV: 12.2 fL (ref 7.5–12.5)
Monocytes Relative: 8.2 %
Neutro Abs: 3198 cells/uL (ref 1500–7800)
Neutrophils Relative %: 49.2 %
Platelets: 219 10*3/uL (ref 140–400)
RBC: 5.24 10*6/uL (ref 4.20–5.80)
RDW: 13.5 % (ref 11.0–15.0)
Total Lymphocyte: 33.9 %
WBC mixed population: 533 cells/uL (ref 200–950)
WBC: 6.5 10*3/uL (ref 3.8–10.8)

## 2018-03-17 LAB — LIPID PANEL
Cholesterol: 215 mg/dL — ABNORMAL HIGH (ref <200)
HDL: 37 mg/dL — ABNORMAL LOW (ref >40)
LDL Cholesterol (Calc): 124 mg/dL (calc) — ABNORMAL HIGH
Non-HDL Cholesterol (Calc): 178 mg/dL (calc) — ABNORMAL HIGH (ref <130)
Total CHOL/HDL Ratio: 5.8 (calc) — ABNORMAL HIGH (ref <5.0)
Triglycerides: 380 mg/dL — ABNORMAL HIGH (ref <150)

## 2018-03-17 LAB — HEMOGLOBIN A1C
Hgb A1c MFr Bld: 5.3 % of total Hgb (ref <5.7)
Mean Plasma Glucose: 105 (calc)
eAG (mmol/L): 5.8 (calc)

## 2018-03-17 LAB — PSA, TOTAL WITH REFLEX TO PSA, FREE: PSA, Total: 0.5 ng/mL (ref <=4.0)

## 2018-03-20 ENCOUNTER — Ambulatory Visit (INDEPENDENT_AMBULATORY_CARE_PROVIDER_SITE_OTHER): Payer: 59 | Admitting: Family Medicine

## 2018-03-20 ENCOUNTER — Encounter: Payer: Self-pay | Admitting: Family Medicine

## 2018-03-20 VITALS — BP 124/86 | HR 81 | Temp 98.2°F | Resp 16 | Ht 72.0 in | Wt 182.0 lb

## 2018-03-20 DIAGNOSIS — Z8719 Personal history of other diseases of the digestive system: Secondary | ICD-10-CM

## 2018-03-20 DIAGNOSIS — Z0000 Encounter for general adult medical examination without abnormal findings: Secondary | ICD-10-CM | POA: Diagnosis not present

## 2018-03-20 DIAGNOSIS — R7303 Prediabetes: Secondary | ICD-10-CM

## 2018-03-20 DIAGNOSIS — D229 Melanocytic nevi, unspecified: Secondary | ICD-10-CM | POA: Diagnosis not present

## 2018-03-20 DIAGNOSIS — E782 Mixed hyperlipidemia: Secondary | ICD-10-CM

## 2018-03-20 DIAGNOSIS — N529 Male erectile dysfunction, unspecified: Secondary | ICD-10-CM | POA: Diagnosis not present

## 2018-03-20 DIAGNOSIS — L821 Other seborrheic keratosis: Secondary | ICD-10-CM | POA: Diagnosis not present

## 2018-03-20 DIAGNOSIS — Z87891 Personal history of nicotine dependence: Secondary | ICD-10-CM

## 2018-03-20 MED ORDER — SILDENAFIL CITRATE 20 MG PO TABS: ORAL_TABLET | ORAL | 5 refills | Status: DC

## 2018-03-20 NOTE — Patient Instructions (Addendum)
Thank you for coming to the office today.  A1c sugar down to 5.3 - much improved from last time 5.8. No longer in Pre-Diabetic range.  Liver function tests were normal. No concerns for liver problems.  Prostate screening was normal.  Abnormal cholesterol elevated Triglycerides. Recommend Fish Oil supplement Omega-3 1000mg  take 1-2 per dose up to twice a day with meals  DERMATOLOGY  Referral sent - stay tuned for appointment  [address], [place] [address] Hours: 8AM-5PM Phone: [phone]  Sarina Ser, MD   Please schedule a Follow-up Appointment to: Return in about 6 months (around 09/20/2018) for 6 months PreDM A1c, Lifestyle/HLD.  If you have any other questions or concerns, please feel free to call the office or send a message through [place]. You may also schedule an earlier appointment if necessary.  Additionally, you may be receiving a survey about your experience at our office within a few days to 1 week by e-mail or mail. We value your feedback.  [name], DO [facility]

## 2018-03-20 NOTE — Progress Notes (Signed)
Subjective:    [name], male    DOB: 1956/07/13, 62 y.o.   MRN: [id number]  Ethan Fitzgerald is a 62 y.o. male presenting on 03/20/2018 for Annual Exam   HPI   Here for Annual Physical and Lab Review  Pre-Diabetes Recent A1c trend 5.4 to 5.8, and then most recent lab 5.3 (03/2018) Meds:Never on meds Currentlynot onACEi / ARB Denies hypoglycemia  HYPERLIPIDEMIA / Hypertriglyceridemia Chronic history of hyperTG and some familial component. Last lipid panel7/2019 still abnormal elevated TG >300, low HDL 37, elevated LDL 124 but improved - Never on cholesterol medication, no statin - Still not taking Fish Oil - Improved diet but admits still eating some chocolate and creamer - he takes Vegetable/Fruit vitamins daily Lifestyle - Diet:Balanced diet, drinks a beer most nights. He drinks more water now 4-8 bottles a day. - Exercise:no change in regular exercise, still walking at work. - Weight unchanged  Former Smoker / History of Tobacco Abuse He remains smoke free, quit 6 months ago with bronchitis episode. He smoked in past for >40 years. He has seen [facility] in 4-01/2018 Dr Juanell Fairly, was treated for acute on chronic bronchitis COPD exac, he has suspected dx of Chronic Bronchitis mostly uncomplicated, he was not pursuing other PFT or lung testing at this time. Also he remains off Breo maintenance therapy inhaler and off Albuterol, he is no longer interested in these treatments and will follow-up if worsening respiratory status again  Follow-up Erectile Dysfunction Reports stable chronic problem. Previously improved on Sildenafil 20mg  generic he has good results taking 3-4 pills per dose. Requesting refills [facility] to [name]. - Admits nocturia x 1 overnight otherwise denies all other ROS prostate symptoms  Health Maintenance:  Due for routine Hep C and HIV in future - will get with next lab panel  Prostate CA Screening: Prior DRE reported normal.  Last PSA 0.5 (03/16/18). Currently asymptomatic except occasional nocturia x 1, otherwise no dx of BPH LUTS. No known family history of prostate CA.   Colon CA Screening: Last Colonoscopy (done by [facility]), results with polyps, good for 3-5 years has not returned yet. Currently asymptomatic. No known family history of colon CA.   Depression screen [facility] 2/9 03/20/2018 10/20/2017 02/18/2017  Decreased Interest 0 0 0  Down, Depressed, Hopeless 0 0 0  PHQ - 2 Score 0 0 0    Past Medical History:  Diagnosis Date  . Erectile dysfunction   . Headache    daily, stress headaches  . Knee pain, left   . Seasonal allergies   . Tobacco dependence    Past Surgical History:  Procedure Laterality Date  . BACK SURGERY     metal plate in back  . COLONOSCOPY    . HERNIA REPAIR Right   . KNEE ARTHROSCOPY Left 03/28/2015   Procedure: Arthroscopic partial medial meniscectomy plus chondral debridement;  Surgeon: Leanor Kail, MD;  Location: [facility];  Service: Orthopedics;  Laterality: Left;   Social History   Socioeconomic History  . Marital status: Married    Spouse name: Not on file  . Number of children: Not on file  . Years of education: Not on file  . Highest education level: Not on file  Occupational History  . Not on file  Social Needs  . Financial resource strain: Not on file  . Food insecurity:    Worry: Not on file    Inability: Not on file  . Transportation needs:  Medical: Not on file    Non-medical: Not on file  Tobacco Use  . Smoking status: Former Smoker    Packs/day: 0.25    Years: 40.00    Pack years: 10.00    Types: Cigarettes    Last attempt to quit: 09/14/2017    Years since quitting: 0.5  . Smokeless tobacco: Former Network engineer and Sexual Activity  . Alcohol use: Yes    Comment: 6 pack on weekend  . Drug use: No  . Sexual activity: Not on file  Lifestyle  . Physical activity:    Days per week: Not on file    Minutes per session: Not on  file  . Stress: Not on file  Relationships  . Social connections:    Talks on phone: Not on file    Gets together: Not on file    Attends religious service: Not on file    Active member of club or organization: Not on file    Attends meetings of clubs or organizations: Not on file    Relationship status: Not on file  . Intimate partner violence:    Fear of current or ex partner: Not on file    Emotionally abused: Not on file    Physically abused: Not on file    Forced sexual activity: Not on file  Other Topics Concern  . Not on file  Social History Narrative  . Not on file   Family History  Problem Relation Age of Onset  . Diabetes Mother   . Heart attack Mother 55  . Cancer Father        liver   . COPD Brother   . HIV/AIDS Brother   . Prostate cancer Neg Hx   . Colon cancer Neg Hx    Current Outpatient Medications on File Prior to Visit  Medication Sig  . Aspirin-Acetaminophen-Caffeine (EXCEDRIN PO) Take 2 tablets by mouth. am  . vitamin C (ASCORBIC ACID) 500 MG tablet Take 500 mg by mouth daily. Am   No current facility-administered medications on file prior to visit.     Review of Systems  Constitutional: Negative for activity change, appetite change, chills, diaphoresis, fatigue and fever.  HENT: Negative for congestion and hearing loss.   Eyes: Negative for visual disturbance.  Respiratory: Negative for apnea, cough, choking, chest tightness, shortness of breath and wheezing.   Cardiovascular: Negative for chest pain, palpitations and leg swelling.  Gastrointestinal: Negative for abdominal pain, anal bleeding, blood in stool, constipation, diarrhea, nausea and vomiting.  Endocrine: Negative for cold intolerance.  Genitourinary: Negative for decreased urine volume, difficulty urinating, dysuria, frequency, hematuria, testicular pain and urgency.  Musculoskeletal: Positive for arthralgias (occasional, none currently, multiple joints, knees). Negative for back pain  and neck pain.  Skin: Negative for rash.       Mole on face concerned about  Allergic/Immunologic: Negative for environmental allergies.  Neurological: Negative for dizziness, weakness, light-headedness, numbness and headaches.  Hematological: Negative for adenopathy.  Psychiatric/Behavioral: Negative for behavioral problems, dysphoric mood and sleep disturbance. The patient is not nervous/anxious.    Per HPI unless specifically indicated above     Objective:    BP 124/86   Pulse 81   Temp 98.2 F (36.8 C) (Oral)   Resp 16   Ht 6' (1.829 m)   Wt 182 lb (82.6 kg)   BMI 24.68 kg/m   Wt Readings from Last 3 Encounters:  03/20/18 182 lb (82.6 kg)  01/09/18 182 lb (82.6 kg)  12/08/17 174 lb (78.9 kg)    Physical Exam  Constitutional: He is oriented to person, place, and time. He appears well-developed and well-nourished. No distress.  Well-appearing, comfortable, cooperative  HENT:  Head: Normocephalic and atraumatic.  Mouth/Throat: Oropharynx is clear and moist.  Frontal / maxillary sinuses non-tender. Nares patent without purulence or edema. Bilateral TMs clear without erythema, effusion or bulging. Oropharynx clear without erythema, exudates, edema or asymmetry.  Eyes: Pupils are equal, round, and reactive to light. Conjunctivae and EOM are normal. Right eye exhibits no discharge. Left eye exhibits no discharge.  Neck: Normal range of motion. Neck supple. No thyromegaly present.  Cardiovascular: Normal rate, regular rhythm, normal heart sounds and intact distal pulses.  No murmur heard. Pulmonary/Chest: Effort normal and breath sounds normal. No respiratory distress. He has no wheezes. He has no rales.  Abdominal: Soft. Bowel sounds are normal. He exhibits no distension and no mass. There is no tenderness.  Genitourinary:  Genitourinary Comments: Deferred DRE  Musculoskeletal: Normal range of motion. He exhibits no edema or tenderness.  Upper / Lower Extremities: - Normal  muscle tone, strength bilateral upper extremities 5/5, lower extremities 5/5  Lymphadenopathy:    He has no cervical adenopathy.  Neurological: He is alert and oriented to person, place, and time.  Distal sensation intact to light touch all extremities  Skin: Skin is warm and dry. No rash noted. He is not diaphoretic. No erythema.  Right upper/lateral aspect with >1 cm pigmented skin lesion mostly appears consistent with SK but not exactly stuck on appearance, no other significant abnormality.  Left lower jawline of face with raised paler appearance dry SK like lesion.  Psychiatric: He has a normal mood and affect. His behavior is normal.  Well groomed, good eye contact, normal speech and thoughts  Nursing note and vitals reviewed.  Results for orders placed or performed in visit on 03/16/18  PSA, Total with Reflex to PSA, Free  Result Value Ref Range   PSA, Total 0.5 < OR = 4.0 ng/mL  CBC with Differential/Platelet  Result Value Ref Range   WBC 6.5 3.8 - 10.8 Thousand/uL   RBC 5.24 4.20 - 5.80 Million/uL   Hemoglobin 16.7 13.2 - 17.1 g/dL   HCT 50.3 (H) 38.5 - 50.0 %   MCV 96.0 80.0 - 100.0 fL   MCH 31.9 27.0 - 33.0 pg   MCHC 33.2 32.0 - 36.0 g/dL   RDW 13.5 11.0 - 15.0 %   Platelets 219 140 - 400 Thousand/uL   MPV 12.2 7.5 - 12.5 fL   Neutro Abs 3,198 1,500 - 7,800 cells/uL   Lymphs Abs 2,204 850 - 3,900 cells/uL   WBC mixed population 533 200 - 950 cells/uL   Eosinophils Absolute 494 15 - 500 cells/uL   Basophils Absolute 72 0 - 200 cells/uL   Neutrophils Relative % 49.2 %   Total Lymphocyte 33.9 %   Monocytes Relative 8.2 %   Eosinophils Relative 7.6 %   Basophils Relative 1.1 %  Hemoglobin A1c  Result Value Ref Range   Hgb A1c MFr Bld 5.3 <5.7 % of total Hgb   Mean Plasma Glucose 105 (calc)   eAG (mmol/L) 5.8 (calc)  Lipid panel  Result Value Ref Range   Cholesterol 215 (H) <200 mg/dL   HDL 37 (L) >40 mg/dL   Triglycerides 380 (H) <150 mg/dL   LDL Cholesterol  (Calc) 124 (H) mg/dL (calc)   Total CHOL/HDL Ratio 5.8 (H) <5.0 (calc)   Non-HDL Cholesterol (  Calc) 178 (H) <130 mg/dL (calc)  COMPLETE METABOLIC PANEL WITH GFR  Result Value Ref Range   Glucose, Bld 104 (H) 65 - 99 mg/dL   BUN 13 7 - 25 mg/dL   Creat 1.01 0.70 - 1.25 mg/dL   GFR, Est Non African American 79 > OR = 60 mL/min/1.41m2   GFR, Est African American 91 > OR = 60 mL/min/1.49m2   BUN/Creatinine Ratio NOT APPLICABLE 6 - 22 (calc)   Sodium 139 135 - 146 mmol/L   Potassium 4.6 3.5 - 5.3 mmol/L   Chloride 106 98 - 110 mmol/L   CO2 26 20 - 32 mmol/L   Calcium 9.8 8.6 - 10.3 mg/dL   Total Protein 6.7 6.1 - 8.1 g/dL   Albumin 3.9 3.6 - 5.1 g/dL   Globulin 2.8 1.9 - 3.7 g/dL (calc)   AG Ratio 1.4 1.0 - 2.5 (calc)   Total Bilirubin 0.9 0.2 - 1.2 mg/dL   Alkaline phosphatase (APISO) 71 40 - 115 U/L   AST 14 10 - 35 U/L   ALT 10 9 - 46 U/L      Assessment & Plan:   Problem List Items Addressed This Visit    Erectile dysfunction    Stable, controlled ED on PDE sildenafil Refill Sildenafil 20mg  sent to [facility] - added extra refills      Relevant Medications   sildenafil (REVATIO) 20 MG tablet   Former smoker    Encourage to remain smoke free since 09/2017 Possible Chronic Bronchitis based on previous flare and imaging No longer on maintenance therapy Breo or rescue Albuterol Did not have PFTs May return to [facility] Dr Juanell Fairly in future if recurrent flare ups      History of hemorrhoids    Resolved on improved diet and vegetable supplement pills Has regular BMs now      Hyperlipidemia    Still uncontrolled cholesterol improving lifestyle, not on statin Last lipid panel 03/2018 - mild low HDL and mild elevated LDL, still TG >380 Calculated ASCVD 10 yr risk score down to 13-15% now former smoker  Plan: 1. Again discussed ASCVD risk - advised that we can reconsider statin in future - Again try to start with Fish Oil OTC 1g BID wc - may inc to 2 pills per  dose max  - to help lower TG 2. Encourage improved lifestyle - low carb/cholesterol emphasized DASH / Mediterranean diet options, reduce portion size, improve regular exercise 3. Follow-up 6 months - then likely repeat lipids yearly 03/2019      Relevant Medications   sildenafil (REVATIO) 20 MG tablet   Pre-diabetes    Well-controlled Pre-DM with A1c 5.3 improved from prior 5.8 Concern HLD  Plan:  1. Not on any therapy currently  2. Encourage improved lifestyle - low carb, low sugar diet, reduce portion size, continue improving regular exercise 3. Follow-up 6 months PreDM A1c        Other Visit Diagnoses    Annual physical exam    -  Primary  Updated Health Maintenance information - Due to schedule colonoscopy w/ [facility] Reviewed recent lab results with patient Encouraged improvement to lifestyle with diet and exercise - Goal of weight loss     Atypical mole       Relevant Orders   Ambulatory referral to Dermatology   SK (seborrheic keratosis)       Relevant Orders   Ambulatory referral to Dermatology  Referral to Dermatology - [facility] for  multiple skin lesions of face, both seem more likely benign but by patient request for 2nd opinion, either biopsy or cryotherapy for possible SK     Meds ordered this encounter  Medications  . sildenafil (REVATIO) 20 MG tablet    Sig: TAKE ONE TO FIVE TABLETS BY MOUTH ABOUT 30 MINUTES PRIOR TO SEX. START WITH 1 TABLET AND INCREASE AS NEEDED    Dispense:  30 tablet    Refill:  5    Added extra refills   Orders Placed This Encounter  Procedures  . Ambulatory referral to Dermatology    Referral Priority:   Routine    Referral Type:   Consultation    Referral Reason:   Specialty Services Required    Referred to Provider:   Ralene Bathe, MD    Requested Specialty:   Dermatology    Number of Visits Requested:   1    Follow up plan: Return in about 6 months (around 09/20/2018) for 6 months PreDM A1c,  Lifestyle/HLD.  [name], [facility] 03/20/2018, 12:44 PM

## 2018-03-20 NOTE — Assessment & Plan Note (Signed)
Stable, controlled ED on PDE sildenafil Refill Sildenafil 20mg  sent to [place] - added extra refills

## 2018-03-20 NOTE — Assessment & Plan Note (Signed)
Resolved on improved diet and vegetable supplement pills Has regular BMs now

## 2018-03-20 NOTE — Assessment & Plan Note (Signed)
Encourage to remain smoke free since 09/2017 Possible Chronic Bronchitis based on previous flare and imaging No longer on maintenance therapy Breo or rescue Albuterol Did not have PFTs May return to [facility] Dr Juanell Fairly in future if recurrent flare ups

## 2018-03-20 NOTE — Assessment & Plan Note (Signed)
Still uncontrolled cholesterol improving lifestyle, not on statin Last lipid panel 03/2018 - mild low HDL and mild elevated LDL, still TG >380 Calculated ASCVD 10 yr risk score down to 13-15% now former smoker  Plan: 1. Again discussed ASCVD risk - advised that we can reconsider statin in future - Again try to start with Fish Oil OTC 1g BID wc - may inc to 2 pills per dose max  - to help lower TG 2. Encourage improved lifestyle - low carb/cholesterol emphasized DASH / Mediterranean diet options, reduce portion size, improve regular exercise 3. Follow-up 6 months - then likely repeat lipids yearly 03/2019

## 2018-03-20 NOTE — Assessment & Plan Note (Signed)
Well-controlled Pre-DM with A1c 5.3 improved from prior 5.8 Concern HLD  Plan:  1. Not on any therapy currently  2. Encourage improved lifestyle - low carb, low sugar diet, reduce portion size, continue improving regular exercise 3. Follow-up 6 months PreDM A1c

## 2018-04-17 ENCOUNTER — Ambulatory Visit: Payer: 59 | Admitting: Family Medicine

## 2018-04-17 ENCOUNTER — Encounter: Payer: Self-pay | Admitting: Family Medicine

## 2018-04-17 VITALS — BP 127/77 | HR 72 | Temp 98.0°F | Resp 16 | Ht 72.0 in | Wt 185.0 lb

## 2018-04-17 DIAGNOSIS — M62838 Other muscle spasm: Secondary | ICD-10-CM | POA: Diagnosis not present

## 2018-04-17 DIAGNOSIS — S161XXA Strain of muscle, fascia and tendon at neck level, initial encounter: Secondary | ICD-10-CM

## 2018-04-17 DIAGNOSIS — R51 Headache: Secondary | ICD-10-CM

## 2018-04-17 DIAGNOSIS — R519 Headache, unspecified: Secondary | ICD-10-CM

## 2018-04-17 MED ORDER — NAPROXEN 500 MG PO TABS: 500.0000 mg | ORAL_TABLET | Freq: Two times a day (BID) | ORAL | 0 refills | Status: DC

## 2018-04-17 MED ORDER — CYCLOBENZAPRINE HCL 10 MG PO TABS: 5.0000 mg | ORAL_TABLET | Freq: Three times a day (TID) | ORAL | 0 refills | Status: DC | PRN

## 2018-04-17 NOTE — Progress Notes (Signed)
Subjective:    [name], male    DOB: September 14, 1955, 62 y.o.   MRN: [id number]  Ethan Fitzgerald is a 62 y.o. male presenting on 04/17/2018 for pain in head (as per patient has severe shooting throbbing pain in head left temporal side onset week)   HPI   Left Sided Neck Pain / Scalp Pain - Reports new onset problem over past 1 week, has new problem of difficulty turning head and neck to the left, he will have significant ache and pain limiting his range of motion in this direction, also associated with episodes sharp throbbing pain in Left side of head lasting about 5 seconds intense pain about 2-3 times a day. 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 - He states this is "not a headache", has tried Excedrin and it didn't take care of it - Denies any trauma, injury, fall, vision loss, numbness, tingling, weakness, hearing loss, rash   Depression screen [facility] 2/9 04/17/2018 03/20/2018 10/20/2017  Decreased Interest 0 0 0  Down, Depressed, Hopeless 0 0 0  PHQ - 2 Score 0 0 0    Social History   Tobacco Use  . Smoking status: Former Smoker    Packs/day: 0.25    Years: 40.00    Pack years: 10.00    Types: Cigarettes    Last attempt to quit: 09/14/2017    Years since quitting: 0.5  . Smokeless tobacco: Former Network engineer Use Topics  . Alcohol use: Yes    Comment: 6 pack on weekend  . Drug use: No    Review of Systems Per HPI unless specifically indicated above     Objective:    BP 127/77   Pulse 72   Temp 98 F (36.7 C) (Oral)   Resp 16   Ht 6' (1.829 m)   Wt 185 lb (83.9 kg)   SpO2 99%   BMI 25.09 kg/m   Wt Readings from Last 3 Encounters:  04/17/18 185 lb (83.9 kg)  03/20/18 182 lb (82.6 kg)  01/09/18 182 lb (82.6 kg)    Physical Exam  Constitutional: He is oriented to person, place, and time. He  appears well-developed and well-nourished. No distress.  Well-appearing, comfortable, cooperative  HENT:  Head: Normocephalic and atraumatic.  Mouth/Throat: Oropharynx is clear and moist.  Eyes: Conjunctivae are normal. Right eye exhibits no discharge. Left eye exhibits no discharge.  Cardiovascular: Normal rate.  Pulmonary/Chest: Effort normal.  Musculoskeletal: He exhibits no edema.  Neck Inspection: symmetrical Palpation: increased muscle hypertonicity left sided paraspinal cervical spine and trapezius, mild tender at base of occiput splenius muscle on L ROM: R sided rotation and flex ext all intact, but limited due to triggered pain and spasm on Left rotation Special Testing: negative Spurling's maneuver Strength: intact Neurovascular: intact  Localized area of sharp pains episodes, none actively, upper mid left sided scalp without rash or deformity or ecchymosis  Neurological: He is alert and oriented to person, place, and time.  Skin: Skin is warm and dry. No rash noted. He is not diaphoretic. No erythema.  Psychiatric: He has a normal mood and affect. His behavior is normal.  Well groomed, good eye contact, normal speech and thoughts  Nursing note and vitals reviewed.  Results for orders placed or performed in visit on 03/16/18  PSA, Total with Reflex to PSA, Free  Result Value Ref Range   PSA, Total 0.5 < OR = 4.0 ng/mL  CBC with Differential/Platelet  Result Value Ref Range   WBC 6.5 3.8 - 10.8 Thousand/uL   RBC 5.24 4.20 - 5.80 Million/uL   Hemoglobin 16.7 13.2 - 17.1 g/dL   HCT 50.3 (H) 38.5 - 50.0 %   MCV 96.0 80.0 - 100.0 fL   MCH 31.9 27.0 - 33.0 pg   MCHC 33.2 32.0 - 36.0 g/dL   RDW 13.5 11.0 - 15.0 %   Platelets 219 140 - 400 Thousand/uL   MPV 12.2 7.5 - 12.5 fL   Neutro Abs 3,198 1,500 - 7,800 cells/uL   Lymphs Abs 2,204 850 - 3,900 cells/uL   WBC mixed population 533 200 - 950 cells/uL   Eosinophils Absolute 494 15 - 500 cells/uL   Basophils Absolute 72 0  - 200 cells/uL   Neutrophils Relative % 49.2 %   Total Lymphocyte 33.9 %   Monocytes Relative 8.2 %   Eosinophils Relative 7.6 %   Basophils Relative 1.1 %  Hemoglobin A1c  Result Value Ref Range   Hgb A1c MFr Bld 5.3 <5.7 % of total Hgb   Mean Plasma Glucose 105 (calc)   eAG (mmol/L) 5.8 (calc)  Lipid panel  Result Value Ref Range   Cholesterol 215 (H) <200 mg/dL   HDL 37 (L) >40 mg/dL   Triglycerides 380 (H) <150 mg/dL   LDL Cholesterol (Calc) 124 (H) mg/dL (calc)   Total CHOL/HDL Ratio 5.8 (H) <5.0 (calc)   Non-HDL Cholesterol (Calc) 178 (H) <130 mg/dL (calc)  COMPLETE METABOLIC PANEL WITH GFR  Result Value Ref Range   Glucose, Bld 104 (H) 65 - 99 mg/dL   BUN 13 7 - 25 mg/dL   Creat 1.01 0.70 - 1.25 mg/dL   GFR, Est Non African American 79 > OR = 60 mL/min/1.75m2   GFR, Est African American 91 > OR = 60 mL/min/1.33m2   BUN/Creatinine Ratio NOT APPLICABLE 6 - 22 (calc)   Sodium 139 135 - 146 mmol/L   Potassium 4.6 3.5 - 5.3 mmol/L   Chloride 106 98 - 110 mmol/L   CO2 26 20 - 32 mmol/L   Calcium 9.8 8.6 - 10.3 mg/dL   Total Protein 6.7 6.1 - 8.1 g/dL   Albumin 3.9 3.6 - 5.1 g/dL   Globulin 2.8 1.9 - 3.7 g/dL (calc)   AG Ratio 1.4 1.0 - 2.5 (calc)   Total Bilirubin 0.9 0.2 - 1.2 mg/dL   Alkaline phosphatase (APISO) 71 40 - 115 U/L   AST 14 10 - 35 U/L   ALT 10 9 - 46 U/L      Assessment & Plan:   Problem List Items Addressed This Visit    None    Visit Diagnoses    Acute strain of neck muscle, initial encounter    -  Primary   Relevant Medications   naproxen (NAPROSYN) 500 MG tablet   cyclobenzaprine (FLEXERIL) 10 MG tablet   Neck muscle spasm       Relevant Medications   naproxen (NAPROSYN) 500 MG tablet   cyclobenzaprine (FLEXERIL) 10 MG tablet   Pain of scalp       Relevant Medications   naproxen (NAPROSYN) 500 MG tablet      Acute L sided neck strain likely cervical paraspinal muscle spasm with possible overuse injury from turning head in fixed  position at work while using ladder, also  associated trapezius distrubution and secondary scalp sharp episodic pains. No worsening or significant improvement. Without complications. No evidence of radicular symptoms or neurological deficits or weakness. - No imaging - Inadequate conservative treatments at home  Plan - Start Naproxen nsaid 500 BID wc 2-4 weeks then PRN - Start Flexeril 5-10mg  TID PRN caution sedation, primarily use at bedtime, avoid use if working - Use heating pad, muscle rub, gradually improve active ROM L rotation - May use massage therapist - Follow-up if not improving or new symptoms - consider prednisone burst   Meds ordered this encounter  Medications  . naproxen (NAPROSYN) 500 MG tablet    Sig: Take 1 tablet (500 mg total) by mouth 2 (two) times daily with a meal. For 2-4 weeks then as needed    Dispense:  60 tablet    Refill:  0  . cyclobenzaprine (FLEXERIL) 10 MG tablet    Sig: Take 0.5-1 tablets (5-10 mg total) by mouth 3 (three) times daily as needed for muscle spasms.    Dispense:  30 tablet    Refill:  0    Follow up plan: Return in about 2 weeks (around 05/01/2018), or if symptoms worsen or fail to improve, for neck pain.  [name], [facility] 04/17/2018, 8:52 AM

## 2018-04-17 NOTE — Patient Instructions (Addendum)
Thank you for coming to the office today.  Possibly this is a left sided neck muscle spasm and reaction causing limited movement and triggering pains into scalp.  Gradually try to improve your range of motion in the neck and slowly work towards looking left - may use muscle rub or heating pad as well.  May try massage therapy  Recommend trial of Anti-inflammatory with Naproxen (Naprosyn) 500mg  tabs - take one with food and plenty of water TWICE daily every day (breakfast and dinner), for next 2 to 4 weeks, then you may take only as needed - DO NOT TAKE any ibuprofen, aleve, motrin while you are taking this medicine - It is safe to take Tylenol Ext Str 500mg  tabs - take 1 to 2 (max dose 1000mg ) every 6 hours as needed for breakthrough pain, max 24 hour daily dose is 6 to 8 tablets or 4000mg   Start Cyclobenzapine (Flexeril) 10mg  tablets (muscle relaxant) - start with half (cut) to one whole pill at night for muscle relaxant - may make you sedated or sleepy (be careful driving or working on this) if tolerated you can take half to whole tab 2 to 3 times daily or every 8 hours as needed  Would offer prednisone if not improved  Notify office if develop rash for possible shingles  Return sooner if new symptoms loss of vision, numbness, tingling, weakness  Please schedule a Follow-up Appointment to: Return in about 2 weeks (around 05/01/2018), or if symptoms worsen or fail to improve, for neck pain.  If you have any other questions or concerns, please feel free to call the office or send a message through [place]. You may also schedule an earlier appointment if necessary.  Additionally, you may be receiving a survey about your experience at our office within a few days to 1 week by e-mail or mail. We value your feedback.  [name], DO [place]

## 2018-05-14 ENCOUNTER — Other Ambulatory Visit: Payer: Self-pay | Admitting: Family Medicine

## 2018-05-14 DIAGNOSIS — M62838 Other muscle spasm: Secondary | ICD-10-CM

## 2018-05-14 DIAGNOSIS — R51 Headache: Secondary | ICD-10-CM

## 2018-05-14 DIAGNOSIS — S161XXA Strain of muscle, fascia and tendon at neck level, initial encounter: Secondary | ICD-10-CM

## 2018-05-14 DIAGNOSIS — R519 Headache, unspecified: Secondary | ICD-10-CM

## 2018-06-15 ENCOUNTER — Other Ambulatory Visit: Payer: Self-pay | Admitting: Family Medicine

## 2018-06-15 DIAGNOSIS — R519 Headache, unspecified: Secondary | ICD-10-CM

## 2018-06-15 DIAGNOSIS — S161XXA Strain of muscle, fascia and tendon at neck level, initial encounter: Secondary | ICD-10-CM

## 2018-06-15 DIAGNOSIS — M62838 Other muscle spasm: Secondary | ICD-10-CM

## 2018-06-15 DIAGNOSIS — R51 Headache: Secondary | ICD-10-CM

## 2018-06-22 ENCOUNTER — Other Ambulatory Visit: Payer: Self-pay | Admitting: Family Medicine

## 2018-06-22 ENCOUNTER — Encounter: Payer: Self-pay | Admitting: Family Medicine

## 2018-06-22 ENCOUNTER — Ambulatory Visit: Payer: 59 | Admitting: Family Medicine

## 2018-06-22 VITALS — BP 125/86 | HR 71 | Temp 98.2°F | Resp 16 | Ht 72.0 in | Wt 180.6 lb

## 2018-06-22 DIAGNOSIS — R7303 Prediabetes: Secondary | ICD-10-CM

## 2018-06-22 DIAGNOSIS — Z23 Encounter for immunization: Secondary | ICD-10-CM

## 2018-06-22 DIAGNOSIS — J441 Chronic obstructive pulmonary disease with (acute) exacerbation: Secondary | ICD-10-CM

## 2018-06-22 DIAGNOSIS — E782 Mixed hyperlipidemia: Secondary | ICD-10-CM

## 2018-06-22 MED ORDER — PREDNISONE 10 MG PO TABS: ORAL_TABLET | ORAL | 0 refills | Status: DC

## 2018-06-22 MED ORDER — ALBUTEROL SULFATE HFA 108 (90 BASE) MCG/ACT IN AERS: 2.0000 | INHALATION_SPRAY | RESPIRATORY_TRACT | 0 refills | Status: DC | PRN

## 2018-06-22 NOTE — Progress Notes (Signed)
Subjective:    [name], male    DOB: 12/13/1955, 62 y.o.   MRN: [id number]  Ethan Fitzgerald is a 62 y.o. 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 - He tried some Tessalon perls as needed for cough, with good results. Still having some wheezing. - Prior CXR consistent with COPD hyperinflation - Also he uses chlorine tablets at work and he had problem with the fumes Denies any fever or chills, sweats, dyspnea  [facility]  Health Maintenance: Due for Flu Shot, will receive today - as afebrile and no bacterial/viral etiology at this time. Clinically more with COPD flare mild.   Depression screen [facility] 2/9 06/22/2018 04/17/2018 03/20/2018  Decreased Interest 0 0 0  Down, Depressed, Hopeless 0 0 0  PHQ - 2 Score 0 0 0    Social History   Tobacco Use  . Smoking status: Former Smoker    Packs/day: 0.25    Years: 40.00    Pack years: 10.00    Types: Cigarettes    Last attempt to quit: 09/14/2017    Years since quitting: 0.7  . Smokeless tobacco: Former Network engineer Use Topics  . Alcohol use: Yes    Comment: 6 pack on weekend  . Drug use: No    Review of Systems Per HPI unless specifically indicated above     Objective:    BP 125/86   Pulse 71   Temp 98.2 F (36.8 C) (Oral)   Resp 16   Ht 6' (1.829 m)   Wt 180 lb 9.6 oz (81.9 kg)   SpO2 97%   BMI 24.49 kg/m   Wt Readings from Last 3 Encounters:  06/22/18 180 lb 9.6 oz (81.9 kg)  04/17/18 185 lb (83.9 kg)  03/20/18 182 lb (82.6 kg)    Physical Exam  Constitutional: He is  oriented to person, place, and time. He appears well-developed and well-nourished. No distress.  Well-appearing, comfortable, cooperative  HENT:  Head: Normocephalic and atraumatic.  Mouth/Throat: Oropharynx is clear and moist.  Eyes: Conjunctivae are normal. Right eye exhibits no discharge. Left eye exhibits no discharge.  Neck: Normal range of motion. Neck supple. No thyromegaly present.  Cardiovascular: Normal rate, regular rhythm, normal heart sounds and intact distal pulses.  No murmur heard. Pulmonary/Chest: Effort normal. No respiratory distress. He has no wheezes. He has no rales.  Mild reduced air movement diffusely, without overt wheezing or crackles. No focal abnormality. No coughing. Speaks full sentences.  Musculoskeletal: Normal range of motion. He exhibits no edema.  Lymphadenopathy:    He has no cervical adenopathy.  Neurological: He is alert and oriented to person, place, and time.  Skin: Skin is warm and dry. No rash noted. He is not diaphoretic. No erythema.  Psychiatric: He has a normal mood and affect. His behavior is normal.  Well groomed, good eye contact, normal speech and thoughts  Nursing note and vitals reviewed.  Results for orders placed or performed in visit on 03/16/18  PSA, Total with Reflex to PSA, Free  Result Value Ref Range   PSA, Total 0.5 < OR = 4.0 ng/mL  CBC with Differential/Platelet  Result Value Ref Range   WBC 6.5 3.8 - 10.8 Thousand/uL   RBC 5.24 4.20 - 5.80 Million/uL   Hemoglobin 16.7 13.2 - 17.1 g/dL   HCT 50.3 (H) 38.5 - 50.0 %   MCV 96.0 80.0 - 100.0 fL   MCH 31.9 27.0 - 33.0 pg   MCHC 33.2 32.0 - 36.0 g/dL   RDW 13.5 11.0 - 15.0 %   Platelets 219 140 - 400 Thousand/uL   MPV 12.2 7.5 - 12.5 fL   Neutro Abs 3,198 1,500 - 7,800 cells/uL   Lymphs Abs 2,204 850 - 3,900 cells/uL   WBC mixed population 533 200 - 950 cells/uL   Eosinophils Absolute 494 15 - 500 cells/uL   Basophils Absolute 72 0 - 200 cells/uL   Neutrophils Relative  % 49.2 %   Total Lymphocyte 33.9 %   Monocytes Relative 8.2 %   Eosinophils Relative 7.6 %   Basophils Relative 1.1 %  Hemoglobin A1c  Result Value Ref Range   Hgb A1c MFr Bld 5.3 <5.7 % of total Hgb   Mean Plasma Glucose 105 (calc)   eAG (mmol/L) 5.8 (calc)  Lipid panel  Result Value Ref Range   Cholesterol 215 (H) <200 mg/dL   HDL 37 (L) >40 mg/dL   Triglycerides 380 (H) <150 mg/dL   LDL Cholesterol (Calc) 124 (H) mg/dL (calc)   Total CHOL/HDL Ratio 5.8 (H) <5.0 (calc)   Non-HDL Cholesterol (Calc) 178 (H) <130 mg/dL (calc)  COMPLETE METABOLIC PANEL WITH GFR  Result Value Ref Range   Glucose, Bld 104 (H) 65 - 99 mg/dL   BUN 13 7 - 25 mg/dL   Creat 1.01 0.70 - 1.25 mg/dL   GFR, Est Non African American 79 > OR = 60 mL/min/1.42m2   GFR, Est African American 91 > OR = 60 mL/min/1.22m2   BUN/Creatinine Ratio NOT APPLICABLE 6 - 22 (calc)   Sodium 139 135 - 146 mmol/L   Potassium 4.6 3.5 - 5.3 mmol/L   Chloride 106 98 - 110 mmol/L   CO2 26 20 - 32 mmol/L   Calcium 9.8 8.6 - 10.3 mg/dL   Total Protein 6.7 6.1 - 8.1 g/dL   Albumin 3.9 3.6 - 5.1 g/dL   Globulin 2.8 1.9 - 3.7 g/dL (calc)   AG Ratio 1.4 1.0 - 2.5 (calc)   Total Bilirubin 0.9 0.2 - 1.2 mg/dL   Alkaline phosphatase (APISO) 71 40 - 115 U/L   AST 14 10 - 35 U/L   ALT 10 9 - 46 U/L      Assessment & Plan:   Problem List Items Addressed This Visit    None    Visit Diagnoses    Acute exacerbation of chronic obstructive pulmonary disease (COPD) (HCC)    -  Primary   Relevant Medications   albuterol (PROVENTIL HFA;VENTOLIN HFA) 108 (90 Base) MCG/ACT inhaler   predniSONE (DELTASONE) 10 MG tablet   Needs flu shot       Relevant Orders   Flu Vaccine QUAD 36+ mos IM (Completed)      Consistent with mild early acute exacerbation of COPD with some wheezing and cough. Prior exac 1-2 in Spring 2019, resolved on prednisone/antibiotic - No hypoxia (97% on RA), afebrile, no recent hospitalization - Not on maintenance  inhaler or rescue Previously seen by Pulm, may return in future if need - may consider daily  maintenance therapy if recurrent flares  Plan: 1. Start Albuterol PRN inhaler 2 puffs q 4 hr PRN wheezing, cough - checked w/ pharmacy covered Ventolin for $35 2. Also given rx Prednisone taper over 6 days if needed to start 3. Considered antibiotics, however afebrile and no hypoxia, would not be indicated in mild AECOPD, unless recurrent or not improved on steroids - Defer chest x-ray given reassuring lung exam - Safe for flu shot today 4. RTC about 1 week if not improving, otherwise strict return criteria to go to ED   Meds ordered this encounter  Medications  . albuterol (PROVENTIL HFA;VENTOLIN HFA) 108 (90 Base) MCG/ACT inhaler    Sig: Inhale 2 puffs into the lungs every 4 (four) hours as needed for wheezing or shortness of breath (cough).    Dispense:  1 Inhaler    Refill:  0  . predniSONE (DELTASONE) 10 MG tablet    Sig: Take 6 tabs with breakfast Day 1, 5 tabs Day 2, 4 tabs Day 3, 3 tabs Day 4, 2 tabs Day 5, 1 tab Day 6.    Dispense:  21 tablet    Refill:  0    Follow up plan: Return in about 1 week (around 06/29/2018), or if symptoms worsen or fail to improve, for COPD.  Future labs ordered for 09/25/18  [name], [facility] 06/22/2018, 4:46 PM

## 2018-06-22 NOTE — Patient Instructions (Addendum)
Thank you for coming to the office today.  1. It sounds like you had an Upper Respiratory Virus or Allergies that cause sinus drainage that has settled into a Bronchitis, lower respiratory tract infection. I don't have concerns for pneumonia today, and think that this should gradually improve. Once you are feeling better, the cough may take a few weeks to fully resolve.  You do not need antibiotics at this time. However, if it does not improve or your symptoms worsen as we have discussed, please follow-up to determine if we need to change your treatment.  - Drink plenty of fluids to improve congestion - You may try over the counter Nasal Saline spray (Simply Saline, Ocean Spray) as needed to reduce congestion. - May take Tylenol (up to 500-1000mg  per dose) 3 times a day as needed for aches and fevers. If it is safe for you to take ibuprofen or advil, you may take these medicines as needed as well.  - Start Prednisone taper for next 6 days  - Use Albuterol inhaler 2 puffs every 4-6 hours around the clock for next 2-3 days, max up to 5 days then use as needed  May use Tessalon Perls  Flu shot today is safe without fever or sign of infection, most likely your wheezing is causing symptoms now.  Future may consider daily inhaler  - Use nasal saline (Simply Saline or Ocean Spray) to flush nasal congestion multiple times a day, may help cough - Drink plenty of fluids to improve congestion  If your symptoms seem to worsen instead of improve over next several days, including significant fever / chills, worsening shortness of breath, worsening wheezing, or nausea / vomiting and can't take medicines - return sooner or go to hospital Emergency Department for more immediate treatment.  DUE for FASTING BLOOD WORK (no food or drink after midnight before the lab appointment, only water or coffee without cream/sugar on the morning of)  SCHEDULE "Lab Only" visit in the morning at the clinic for lab draw in  3 MONTHS   - Make sure Lab Only appointment is at about 1 week before your next appointment, so that results will be available  For Lab Results, once available within 2-3 days of blood draw, you can can log in to MyChart online to view your results and a brief explanation. Also, we can discuss results at next follow-up visit.   Please schedule a Follow-up Appointment to: Return in about 1 week (around 06/29/2018), or if symptoms worsen or fail to improve, for COPD.  If you have any other questions or concerns, please feel free to call the office or send a message through [place]. You may also schedule an earlier appointment if necessary.  Additionally, you may be receiving a survey about your experience at our office within a few days to 1 week by e-mail or mail. We value your feedback.  [name], DO [place]

## 2018-07-15 ENCOUNTER — Other Ambulatory Visit: Payer: Self-pay | Admitting: Family Medicine

## 2018-07-15 DIAGNOSIS — S161XXA Strain of muscle, fascia and tendon at neck level, initial encounter: Secondary | ICD-10-CM

## 2018-07-15 DIAGNOSIS — R519 Headache, unspecified: Secondary | ICD-10-CM

## 2018-07-15 DIAGNOSIS — J441 Chronic obstructive pulmonary disease with (acute) exacerbation: Secondary | ICD-10-CM

## 2018-07-15 DIAGNOSIS — M62838 Other muscle spasm: Secondary | ICD-10-CM

## 2018-07-15 DIAGNOSIS — R51 Headache: Secondary | ICD-10-CM

## 2018-09-22 ENCOUNTER — Other Ambulatory Visit: Payer: Self-pay

## 2018-09-22 DIAGNOSIS — E782 Mixed hyperlipidemia: Secondary | ICD-10-CM

## 2018-09-22 DIAGNOSIS — R7303 Prediabetes: Secondary | ICD-10-CM

## 2018-09-25 ENCOUNTER — Other Ambulatory Visit: Payer: 59

## 2018-09-26 LAB — HEMOGLOBIN A1C
Hgb A1c MFr Bld: 5.4 % of total Hgb (ref <5.7)
Mean Plasma Glucose: 108 (calc)
eAG (mmol/L): 6 (calc)

## 2018-09-26 LAB — LIPID PANEL
Cholesterol: 193 mg/dL (ref <200)
HDL: 36 mg/dL — ABNORMAL LOW (ref >40)
LDL Cholesterol (Calc): 108 mg/dL (calc) — ABNORMAL HIGH
Non-HDL Cholesterol (Calc): 157 mg/dL (calc) — ABNORMAL HIGH (ref <130)
Total CHOL/HDL Ratio: 5.4 (calc) — ABNORMAL HIGH (ref <5.0)
Triglycerides: 371 mg/dL — ABNORMAL HIGH (ref <150)

## 2018-10-02 ENCOUNTER — Ambulatory Visit: Payer: 59 | Admitting: Family Medicine

## 2018-10-02 ENCOUNTER — Other Ambulatory Visit: Payer: Self-pay | Admitting: Family Medicine

## 2018-10-02 ENCOUNTER — Encounter: Payer: Self-pay | Admitting: Family Medicine

## 2018-10-02 VITALS — BP 113/74 | HR 79 | Temp 97.8°F | Resp 16 | Ht 72.0 in | Wt 186.0 lb

## 2018-10-02 DIAGNOSIS — Z125 Encounter for screening for malignant neoplasm of prostate: Secondary | ICD-10-CM

## 2018-10-02 DIAGNOSIS — E782 Mixed hyperlipidemia: Secondary | ICD-10-CM

## 2018-10-02 DIAGNOSIS — J40 Bronchitis, not specified as acute or chronic: Secondary | ICD-10-CM

## 2018-10-02 DIAGNOSIS — Z0000 Encounter for general adult medical examination without abnormal findings: Secondary | ICD-10-CM

## 2018-10-02 DIAGNOSIS — R7303 Prediabetes: Secondary | ICD-10-CM | POA: Diagnosis not present

## 2018-10-02 DIAGNOSIS — Z1159 Encounter for screening for other viral diseases: Secondary | ICD-10-CM

## 2018-10-02 DIAGNOSIS — N529 Male erectile dysfunction, unspecified: Secondary | ICD-10-CM | POA: Diagnosis not present

## 2018-10-02 DIAGNOSIS — Z114 Encounter for screening for human immunodeficiency virus [HIV]: Secondary | ICD-10-CM

## 2018-10-02 MED ORDER — SILDENAFIL CITRATE 20 MG PO TABS: ORAL_TABLET | ORAL | 5 refills | Status: DC

## 2018-10-02 MED ORDER — BENZONATATE 100 MG PO CAPS: 100.0000 mg | ORAL_CAPSULE | Freq: Three times a day (TID) | ORAL | 0 refills | Status: DC | PRN

## 2018-10-02 MED ORDER — GUAIFENESIN-CODEINE 100-10 MG/5ML PO SYRP: 5.0000 mL | ORAL_SOLUTION | Freq: Every evening | ORAL | 0 refills | Status: DC | PRN

## 2018-10-02 NOTE — Assessment & Plan Note (Signed)
Well-controlled Pre-DM with A1c 5.4, stable from 5.4 Concern HLD  Plan:  1. Not on any therapy currently  2. Encourage improved lifestyle - low carb, low sugar diet, reduce portion size, continue improving regular exercise 3. Follow-up 6 months PreDM with labs

## 2018-10-02 NOTE — Assessment & Plan Note (Signed)
Stable, controlled ED on PDE sildenafil Refill Sildenafil 20mg  printed - he can try [facility] w/ goodrx coupon, or may go to local [facility] to see if can get discounted price as well

## 2018-10-02 NOTE — Patient Instructions (Addendum)
Thank you for coming to the office today.  Recent Labs    10/07/17 1023 03/16/18 0803 09/25/18 0811  HGBA1C 5.8* 5.3 5.4   Lipid Panel     Component Value Date/Time   CHOL 193 09/25/2018 0811   TRIG 371 (H) 09/25/2018 0811   HDL 36 (L) 09/25/2018 0811   CHOLHDL 5.4 (H) 09/25/2018 0811   VLDL 54 (H) 03/15/2017 0839   LDLCALC 108 (H) 09/25/2018 0811    Try Fish Oil Omega3 1,000 mg capsule twice daily with meal - for triglycerides  Improved LDL. Keep up good work.  Take Sildenafil to [facility]  DUE for FASTING BLOOD WORK (no food or drink after midnight before the lab appointment, only water or coffee without cream/sugar on the morning of)  SCHEDULE "Lab Only" visit in the morning at the clinic for lab draw in 6 MONTHS   - Make sure Lab Only appointment is at about 1 week before your next appointment, so that results will be available  For Lab Results, once available within 2-3 days of blood draw, you can can log in to MyChart online to view your results and a brief explanation. Also, we can discuss results at next follow-up visit.   Please schedule a Follow-up Appointment to: Return in about 6 months (around 04/02/2019) for Annual Physical.  If you have any other questions or concerns, please feel free to call the office or send a message through [place]. You may also schedule an earlier appointment if necessary.  Additionally, you may be receiving a survey about your experience at our office within a few days to 1 week by e-mail or mail. We value your feedback.  [name], DO [place]

## 2018-10-02 NOTE — Progress Notes (Signed)
Subjective:    [name], male    DOB: Mar 28, 1956, 63 y.o.   MRN: [id number]  Ethan Fitzgerald is a 63 y.o. male presenting on 10/02/2018 for Hyperlipidemia (follow up)   HPI  Pre-Diabetes Recent A1c trend has improved. Recent lab shows A1c 5.4. last time was 5.3 He admits overall has improved diet and lifestyle. But he is still eating chocolate and drinking beer regularly. Meds:Never on meds Currentlynot onACEi / ARB Denies hypoglycemia, polyuria.  HYPERLIPIDEMIA / Hypertriglyceridemia Chronic history of hyperTG and some familial component. Last lipid panel re-checked now 09/2018 with slightly improve but still significantly elevated TG >370, otherwise improved LDL cholesterol 120s down to 100s - Never on cholesterol medication, no statin - Still not taking Fish Oil since last visit recommended - Improved diet but admits still eating some chocolate and creamer, and drinking beer - he takes Vegetable/Fruit vitamins daily Lifestyle - Weight up 1 lb in 5 months - Drinking more water now - Exercise:no change in regular exercise, still walking at work.  Additional complaint Recent URI few weeks ago. Still having productive phlegm. Feels better, has lingering cough still. Rarely uses albuterol inhaler Denies short of breath, wheezing, fever or chills, headache or congestion  Follow-up Erectile Dysfunction Reports stable chronic problem. Previously improved on Sildenafil 20mg  generic he has good results taking 3-4 pills per dose. 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 - Admits nocturia x 1 overnight otherwise denies all other ROS prostate symptoms   Health Maintenance: UTD Flu Vaccine 06/2018  Depression screen [facility] 2/9 10/02/2018 06/22/2018 04/17/2018  Decreased Interest 0 0 0  Down, Depressed, Hopeless 0 0 0  PHQ - 2 Score 0 0 0    Social History   Tobacco Use  . Smoking status:  Former Smoker    Packs/day: 0.25    Years: 40.00    Pack years: 10.00    Types: Cigarettes    Last attempt to quit: 09/14/2017    Years since quitting: 1.0  . Smokeless tobacco: Former Network engineer Use Topics  . Alcohol use: Yes    Comment: 6 pack on weekend  . Drug use: No    Review of Systems Per HPI unless specifically indicated above     Objective:    BP 113/74   Pulse 79   Temp 97.8 F (36.6 C) (Oral)   Resp 16   Ht 6' (1.829 m)   Wt 186 lb (84.4 kg)   BMI 25.23 kg/m   Wt Readings from Last 3 Encounters:  10/02/18 186 lb (84.4 kg)  06/22/18 180 lb 9.6 oz (81.9 kg)  04/17/18 185 lb (83.9 kg)    Physical Exam Vitals signs and nursing note reviewed.  Constitutional:      General: He is not in acute distress.    Appearance: He is well-developed. He is not diaphoretic.     Comments: Well-appearing, comfortable, cooperative  HENT:     Head: Normocephalic and atraumatic.  Eyes:     General:        Right eye: No discharge.        Left eye: No discharge.     Conjunctiva/sclera: Conjunctivae normal.  Neck:     Musculoskeletal: Normal range of motion and neck supple.     Thyroid: No thyromegaly.  Cardiovascular:     Rate and Rhythm: Normal rate and regular rhythm.  Heart sounds: Normal heart sounds. No murmur.  Pulmonary:     Effort: Pulmonary effort is normal. No respiratory distress.     Breath sounds: No wheezing or rales.     Comments: Mild coarse breath sounds. No focal wheezing or crackles. Occasional cough. Musculoskeletal: Normal range of motion.  Lymphadenopathy:     Cervical: No cervical adenopathy.  Skin:    General: Skin is warm and dry.     Findings: No erythema or rash.  Neurological:     Mental Status: He is alert and oriented to person, place, and time.  Psychiatric:        Behavior: Behavior normal.     Comments: Well groomed, good eye contact, normal speech and thoughts      Recent Labs    10/07/17 1023 03/16/18 0803  09/25/18 0811  HGBA1C 5.8* 5.3 5.4    Results for orders placed or performed in visit on 09/22/18  Hemoglobin A1c  Result Value Ref Range   Hgb A1c MFr Bld 5.4 <5.7 % of total Hgb   Mean Plasma Glucose 108 (calc)   eAG (mmol/L) 6.0 (calc)  Lipid panel  Result Value Ref Range   Cholesterol 193 <200 mg/dL   HDL 36 (L) >40 mg/dL   Triglycerides 371 (H) <150 mg/dL   LDL Cholesterol (Calc) 108 (H) mg/dL (calc)   Total CHOL/HDL Ratio 5.4 (H) <5.0 (calc)   Non-HDL Cholesterol (Calc) 157 (H) <130 mg/dL (calc)      Assessment & Plan:   Problem List Items Addressed This Visit    Erectile dysfunction    Stable, controlled ED on PDE sildenafil Refill Sildenafil 20mg  printed - he can try [facility] w/ goodrx coupon, or may go to local [facility] to see if can get discounted price as well      Relevant Medications   sildenafil (REVATIO) 20 MG tablet   Hyperlipidemia - Primary    Improved LDL on diet changes, but still elevated TG >370, likely sugar/chocolate/alcohol-beer intake Last lipid panel 03/2018 - mild low HDL and mild elevated LDL, still TG >380 Calculated ASCVD 10 yr risk score down to 13-15% now former smoker  Plan: 1. Again discussed ASCVD risk - advised that we can reconsider statin in future - Again needs to start with Fish Oil OTC 1g BID wc - may inc to 2 pills per dose max  - to help lower TG 2. Encourage improved lifestyle - low carb/cholesterol emphasized DASH / Mediterranean diet options, reduce portion size, improve regular exercise 3. Follow-up 6 months for yearly repeat labs      Relevant Medications   sildenafil (REVATIO) 20 MG tablet   Pre-diabetes    Well-controlled Pre-DM with A1c 5.4, stable from 5.4 Concern HLD  Plan:  1. Not on any therapy currently  2. Encourage improved lifestyle - low carb, low sugar diet, reduce portion size, continue improving regular exercise 3. Follow-up 6 months PreDM with labs       Other Visit Diagnoses    Bronchitis        Relevant Medications   guaiFENesin-codeine (ROBITUSSIN AC) 100-10 MG/5ML syrup   benzonatate (TESSALON) 100 MG capsule    residual lingering cough consistent with bronchitis, likely viral or post nasal drainage, he is not interested in nasal spray today, instead, he can try cough syrup nightly caution sedation and tessalon during day, may still using Albuterol PRN - Hold repeat prednisone, given risk of elevated sugar at this time, may need to use again in  future if worsening    Meds ordered this encounter  Medications  . sildenafil (REVATIO) 20 MG tablet    Sig: TAKE ONE TO FIVE TABLETS BY MOUTH ABOUT 30 MINUTES PRIOR TO SEX. START WITH 1 TABLET AND INCREASE AS NEEDED    Dispense:  30 tablet    Refill:  5    Added extra refills  . guaiFENesin-codeine (ROBITUSSIN AC) 100-10 MG/5ML syrup    Sig: Take 5-10 mLs by mouth at bedtime as needed for cough.    Dispense:  118 mL    Refill:  0  . benzonatate (TESSALON) 100 MG capsule    Sig: Take 1 capsule (100 mg total) by mouth 3 (three) times daily as needed for cough.    Dispense:  30 capsule    Refill:  0     Follow up plan: Return in about 6 months (around 04/02/2019) for Annual Physical.  Future labs ordered for 03/27/19  [name], DO [facility] 10/02/2018, 8:12 AM

## 2018-10-02 NOTE — Assessment & Plan Note (Signed)
Improved LDL on diet changes, but still elevated TG >370, likely sugar/chocolate/alcohol-beer intake Last lipid panel 03/2018 - mild low HDL and mild elevated LDL, still TG >380 Calculated ASCVD 10 yr risk score down to 13-15% now former smoker  Plan: 1. Again discussed ASCVD risk - advised that we can reconsider statin in future - Again needs to start with Fish Oil OTC 1g BID wc - may inc to 2 pills per dose max  - to help lower TG 2. Encourage improved lifestyle - low carb/cholesterol emphasized DASH / Mediterranean diet options, reduce portion size, improve regular exercise 3. Follow-up 6 months for yearly repeat labs

## 2018-10-06 ENCOUNTER — Encounter: Payer: Self-pay | Admitting: Family Medicine

## 2018-10-12 ENCOUNTER — Other Ambulatory Visit: Payer: Self-pay | Admitting: Family Medicine

## 2018-10-12 DIAGNOSIS — R519 Headache, unspecified: Secondary | ICD-10-CM

## 2018-10-12 DIAGNOSIS — M62838 Other muscle spasm: Secondary | ICD-10-CM

## 2018-10-12 DIAGNOSIS — R51 Headache: Secondary | ICD-10-CM

## 2018-10-12 DIAGNOSIS — S161XXA Strain of muscle, fascia and tendon at neck level, initial encounter: Secondary | ICD-10-CM

## 2019-01-01 ENCOUNTER — Other Ambulatory Visit: Payer: Self-pay | Admitting: Family Medicine

## 2019-01-01 DIAGNOSIS — M62838 Other muscle spasm: Secondary | ICD-10-CM

## 2019-01-01 DIAGNOSIS — R51 Headache: Secondary | ICD-10-CM

## 2019-01-01 DIAGNOSIS — S161XXA Strain of muscle, fascia and tendon at neck level, initial encounter: Secondary | ICD-10-CM

## 2019-01-01 DIAGNOSIS — R519 Headache, unspecified: Secondary | ICD-10-CM

## 2019-03-27 ENCOUNTER — Other Ambulatory Visit: Payer: 59

## 2019-03-27 DIAGNOSIS — R7303 Prediabetes: Secondary | ICD-10-CM

## 2019-03-27 DIAGNOSIS — Z0000 Encounter for general adult medical examination without abnormal findings: Secondary | ICD-10-CM

## 2019-03-27 DIAGNOSIS — Z125 Encounter for screening for malignant neoplasm of prostate: Secondary | ICD-10-CM

## 2019-03-27 DIAGNOSIS — Z114 Encounter for screening for human immunodeficiency virus [HIV]: Secondary | ICD-10-CM

## 2019-03-27 DIAGNOSIS — E782 Mixed hyperlipidemia: Secondary | ICD-10-CM

## 2019-03-27 DIAGNOSIS — Z1159 Encounter for screening for other viral diseases: Secondary | ICD-10-CM

## 2019-03-28 LAB — CBC WITH DIFFERENTIAL/PLATELET
Absolute Monocytes: 547 cells/uL (ref 200–950)
Basophils Absolute: 58 cells/uL (ref 0–200)
Basophils Relative: 0.8 %
Eosinophils Absolute: 511 cells/uL — ABNORMAL HIGH (ref 15–500)
Eosinophils Relative: 7.1 %
HCT: 51.6 % — ABNORMAL HIGH (ref 38.5–50.0)
Hemoglobin: 17.3 g/dL — ABNORMAL HIGH (ref 13.2–17.1)
Lymphs Abs: 2311 cells/uL (ref 850–3900)
MCH: 32.7 pg (ref 27.0–33.0)
MCHC: 33.5 g/dL (ref 32.0–36.0)
MCV: 97.5 fL (ref 80.0–100.0)
MPV: 12.6 fL — ABNORMAL HIGH (ref 7.5–12.5)
Monocytes Relative: 7.6 %
Neutro Abs: 3773 cells/uL (ref 1500–7800)
Neutrophils Relative %: 52.4 %
Platelets: 165 10*3/uL (ref 140–400)
RBC: 5.29 10*6/uL (ref 4.20–5.80)
RDW: 13.1 % (ref 11.0–15.0)
Total Lymphocyte: 32.1 %
WBC: 7.2 10*3/uL (ref 3.8–10.8)

## 2019-03-28 LAB — COMPLETE METABOLIC PANEL WITH GFR
AG Ratio: 1.7 (calc) (ref 1.0–2.5)
ALT: 12 U/L (ref 9–46)
AST: 17 U/L (ref 10–35)
Albumin: 4.3 g/dL (ref 3.6–5.1)
Alkaline phosphatase (APISO): 67 U/L (ref 35–144)
BUN: 17 mg/dL (ref 7–25)
CO2: 28 mmol/L (ref 20–32)
Calcium: 10.2 mg/dL (ref 8.6–10.3)
Chloride: 106 mmol/L (ref 98–110)
Creat: 1.07 mg/dL (ref 0.70–1.25)
GFR, Est African American: 85 mL/min/{1.73_m2} (ref >=60)
GFR, Est Non African American: 73 mL/min/{1.73_m2} (ref >=60)
Globulin: 2.5 g/dL (calc) (ref 1.9–3.7)
Glucose, Bld: 105 mg/dL — ABNORMAL HIGH (ref 65–99)
Potassium: 4.1 mmol/L (ref 3.5–5.3)
Sodium: 141 mmol/L (ref 135–146)
Total Bilirubin: 1.3 mg/dL — ABNORMAL HIGH (ref 0.2–1.2)
Total Protein: 6.8 g/dL (ref 6.1–8.1)

## 2019-03-28 LAB — LIPID PANEL
Cholesterol: 217 mg/dL — ABNORMAL HIGH (ref <200)
HDL: 38 mg/dL — ABNORMAL LOW (ref >=40)
LDL Cholesterol (Calc): 137 mg/dL (calc) — ABNORMAL HIGH
Non-HDL Cholesterol (Calc): 179 mg/dL (calc) — ABNORMAL HIGH (ref <130)
Total CHOL/HDL Ratio: 5.7 (calc) — ABNORMAL HIGH (ref <5.0)
Triglycerides: 295 mg/dL — ABNORMAL HIGH (ref <150)

## 2019-03-28 LAB — HIV ANTIBODY (ROUTINE TESTING W REFLEX): HIV 1&2 Ab, 4th Generation: NONREACTIVE

## 2019-03-28 LAB — PSA: PSA: 0.9 ng/mL (ref <=4.0)

## 2019-03-28 LAB — HEMOGLOBIN A1C
Hgb A1c MFr Bld: 5.4 % of total Hgb (ref <5.7)
Mean Plasma Glucose: 108 (calc)
eAG (mmol/L): 6 (calc)

## 2019-03-28 LAB — TSH: TSH: 2.78 mIU/L (ref 0.40–4.50)

## 2019-03-28 LAB — HEPATITIS C ANTIBODY
Hepatitis C Ab: NONREACTIVE
SIGNAL TO CUT-OFF: 0.05 (ref <1.00)

## 2019-03-28 LAB — T4, FREE: Free T4: 1.1 ng/dL (ref 0.8–1.8)

## 2019-03-30 ENCOUNTER — Other Ambulatory Visit: Payer: Self-pay | Admitting: Family Medicine

## 2019-03-30 DIAGNOSIS — S161XXA Strain of muscle, fascia and tendon at neck level, initial encounter: Secondary | ICD-10-CM

## 2019-03-30 DIAGNOSIS — R519 Headache, unspecified: Secondary | ICD-10-CM

## 2019-03-30 DIAGNOSIS — M62838 Other muscle spasm: Secondary | ICD-10-CM

## 2019-04-03 ENCOUNTER — Ambulatory Visit (INDEPENDENT_AMBULATORY_CARE_PROVIDER_SITE_OTHER): Payer: 59 | Admitting: Family Medicine

## 2019-04-03 ENCOUNTER — Encounter: Payer: Self-pay | Admitting: Family Medicine

## 2019-04-03 ENCOUNTER — Telehealth: Payer: Self-pay

## 2019-04-03 ENCOUNTER — Other Ambulatory Visit: Payer: Self-pay | Admitting: Family Medicine

## 2019-04-03 ENCOUNTER — Other Ambulatory Visit: Payer: Self-pay

## 2019-04-03 VITALS — BP 128/84 | HR 80 | Temp 97.9°F | Resp 16 | Ht 72.0 in | Wt 184.0 lb

## 2019-04-03 DIAGNOSIS — R7309 Other abnormal glucose: Secondary | ICD-10-CM

## 2019-04-03 DIAGNOSIS — Z23 Encounter for immunization: Secondary | ICD-10-CM | POA: Diagnosis not present

## 2019-04-03 DIAGNOSIS — J432 Centrilobular emphysema: Secondary | ICD-10-CM | POA: Diagnosis not present

## 2019-04-03 DIAGNOSIS — Z87891 Personal history of nicotine dependence: Secondary | ICD-10-CM

## 2019-04-03 DIAGNOSIS — Z0000 Encounter for general adult medical examination without abnormal findings: Secondary | ICD-10-CM

## 2019-04-03 DIAGNOSIS — K635 Polyp of colon: Secondary | ICD-10-CM

## 2019-04-03 DIAGNOSIS — E782 Mixed hyperlipidemia: Secondary | ICD-10-CM

## 2019-04-03 DIAGNOSIS — Z1211 Encounter for screening for malignant neoplasm of colon: Secondary | ICD-10-CM

## 2019-04-03 MED ORDER — ANORO ELLIPTA 62.5-25 MCG/INH IN AEPB: 1.0000 | INHALATION_SPRAY | Freq: Every day | RESPIRATORY_TRACT | 0 refills | Status: DC

## 2019-04-03 MED ORDER — ALBUTEROL SULFATE HFA 108 (90 BASE) MCG/ACT IN AERS: 1.0000 | INHALATION_SPRAY | RESPIRATORY_TRACT | 2 refills | Status: DC | PRN

## 2019-04-03 MED ORDER — BENZONATATE 100 MG PO CAPS: 100.0000 mg | ORAL_CAPSULE | Freq: Three times a day (TID) | ORAL | 1 refills | Status: DC | PRN

## 2019-04-03 MED ORDER — ROSUVASTATIN CALCIUM 10 MG PO TABS: 10.0000 mg | ORAL_TABLET | Freq: Every day | ORAL | 1 refills | Status: DC

## 2019-04-03 NOTE — Telephone Encounter (Signed)
Patient called and is now wanting an rx for cholesterol meds to be sent to [facility].

## 2019-04-03 NOTE — Patient Instructions (Addendum)
Thank you for coming to the office today.  Recent Labs    09/25/18 0811 03/27/19 0817  HGBA1C 5.4 5.4   Great sugar results, you are not in range of Pre-Diabetes. Keep up the good work on limiting starches / carbs.  Improved Triglycerides now, significantly but still elevated. Keep taking Fish Oil  In future can consider a Statin Cholesterol medicine.  Liver enzymes today are normal as discussed, see result page.  Consider mild COPD / Emphysema, see if you can check into cost of these daily inhaler medication.  Preferred daily mild but very effective inhalers - Anoro (sample) - Incruse - Spiriva  Stronger dual medicine inhalers  - Advair - Dulera - Breo - Symbicort - Trelegy  Older generation, single steroid inhaler, not as strong. - Flovent - Qvar  Today received Pneumonia vaccine  Prevnar-13 next one is 1 year pneumovax-23 -------  Ordered Colonoscopy for screening.  [facility]) [address] [place], [place] [address] Phone: [phone]   DUE for FASTING BLOOD WORK (no food or drink after midnight before the lab appointment, only water or coffee without cream/sugar on the morning of)  SCHEDULE "Lab Only" visit in the morning at the clinic for lab draw in 6 MONTHS   - Make sure Lab Only appointment is at about 1 week before your next appointment, so that results will be available  For Lab Results, once available within 2-3 days of blood draw, you can can log in to MyChart online to view your results and a brief explanation. Also, we can discuss results at next follow-up visit.    Please schedule a Follow-up Appointment to: Return in about 6 months (around 10/04/2019) for 6 months lab result HLD, Sugar, COPD, HTN.  If you have any other questions or concerns, please feel free to call the office or send a message through [place]. You may also schedule an earlier appointment if necessary.  Additionally, you may be  receiving a survey about your experience at our office within a few days to 1 week by e-mail or mail. We value your feedback.  [name], DO [place]

## 2019-04-03 NOTE — Telephone Encounter (Signed)
New rx Rosuvastatin 10mg  daily at bedtime, sent to his pharmacy [facility].  Low to moderate dose to start with.  If develop side effect muscle ache joint pain, can try to still take for up to 2-4 weeks and if it resolves that is normal. If it does not go away he can contact us for advice.  [name], [facility] 04/03/2019, 12:20 PM

## 2019-04-03 NOTE — Progress Notes (Signed)
Subjective:    [name], male    DOB: 04-30-1956, 63 y.o.   MRN: [id number]  Ethan Fitzgerald is a 63 y.o. male presenting on 04/03/2019 for Annual Exam   HPI   Elevated A1c A1c stable to improved 5.4 He admits overall has improved diet and lifestyle.drinking beer regularly. Meds:Never on meds Currentlynot onACEi / ARB Denies hypoglycemia, polyuria.  HYPERLIPIDEMIA/ Hypertriglyceridemia Chronic history of hyperTG and some familial component.Last lipid panel 03/2019  - with improved TG down 100 points on fish oil, but still elevated, LDL remains elevated >130 - Never on cholesterol medication, no statin - Taking FIsh Oil - Improved diet but admits still admits drinking beer Lifestyle - Weight down 2 lbs in 6 months - Drinking more water now - Exercise:no change in regular exercise, still walking at work.  Additional complaint  Centrilobular Emphysema DAYTIME SLEEPINESS / TIREDNESS / Wheezing Admits some wheezing at night while sleeping, or noisy breathing, no problem during work week, only on Sunday when relaxing usually doze off, rarely fall asleep otherwise during other activities. Only watching TV while resting. - He also describes some episodes of dyspnea or wheezing after working outdoors, worse with dirt or particulates that he could breathe in could flare his breathing, using Albuterol PRN with relief, needs refill. - Former smoker quit 1.5 year ago   Health Maintenance:  Negative screening Hep C and HIV lab tests.  Prostate CA Screening: Prior DRE reported normal. Last PSA 0.9 (03/2019). Currently asymptomatic except occasional nocturia x 1, otherwise no dx of BPH LUTS. No known family history of prostate CA.   Colon CA Screening: Last Colonoscopy (done by [facility]), results with polyps. Due for repeat colon CA screening, discussed options, he prefers to repeat colonoscopy, request local referral., prior was done in [place]. Currently  asymptomatic. No known family history of colon CA.   Depression screen [facility] 2/9 04/03/2019 10/02/2018 06/22/2018  Decreased Interest 0 0 0  Down, Depressed, Hopeless 0 0 0  PHQ - 2 Score 0 0 0    Past Medical History:  Diagnosis Date  . Erectile dysfunction   . Headache    daily, stress headaches  . Knee pain, left   . Seasonal allergies   . Tobacco dependence    Past Surgical History:  Procedure Laterality Date  . BACK SURGERY     metal plate in back  . COLONOSCOPY    . HERNIA REPAIR Right   . KNEE ARTHROSCOPY Left 03/28/2015   Procedure: Arthroscopic partial medial meniscectomy plus chondral debridement;  Surgeon: Leanor Kail, MD;  Location: [facility];  Service: Orthopedics;  Laterality: Left;   Social History   Socioeconomic History  . Marital status: Married    Spouse name: Not on file  . Number of children: Not on file  . Years of education: Not on file  . Highest education level: Not on file  Occupational History  . Not on file  Social Needs  . Financial resource strain: Not on file  . Food insecurity    Worry: Not on file    Inability: Not on file  . Transportation needs    Medical: Not on file    Non-medical: Not on file  Tobacco Use  . Smoking status: Former Smoker    Packs/day: 0.25    Years: 40.00    Pack years: 10.00    Types: Cigarettes    Quit date: 09/14/2017    Years since quitting: 1.5  .  Smokeless tobacco: Former Network engineer and Sexual Activity  . Alcohol use: Yes    Comment: 6 pack on weekend  . Drug use: No  . Sexual activity: Not on file  Lifestyle  . Physical activity    Days per week: Not on file    Minutes per session: Not on file  . Stress: Not on file  Relationships  . Social Herbalist on phone: Not on file    Gets together: Not on file    Attends religious service: Not on file    Active member of club or organization: Not on file    Attends meetings of clubs or organizations: Not on file     Relationship status: Not on file  . Intimate partner violence    Fear of current or ex partner: Not on file    Emotionally abused: Not on file    Physically abused: Not on file    Forced sexual activity: Not on file  Other Topics Concern  . Not on file  Social History Narrative  . Not on file   Family History  Problem Relation Age of Onset  . Diabetes Mother   . Heart attack Mother 8  . Cancer Father        liver   . COPD Brother   . HIV/AIDS Brother   . Prostate cancer Neg Hx   . Colon cancer Neg Hx    Current Outpatient Medications on File Prior to Visit  Medication Sig  . Aspirin-Acetaminophen-Caffeine (EXCEDRIN PO) Take 2 tablets by mouth. am  . naproxen (NAPROSYN) 500 MG tablet TAKE 1 TABLET (500 MG TOTAL) BY MOUTH 2 (TWO) TIMES DAILY WITH A MEAL. FOR 2-4 WEEKS THEN AS NEEDED  . sildenafil (REVATIO) 20 MG tablet TAKE ONE TO FIVE TABLETS BY MOUTH ABOUT 30 MINUTES PRIOR TO SEX. START WITH 1 TABLET AND INCREASE AS NEEDED  . vitamin C (ASCORBIC ACID) 500 MG tablet Take 500 mg by mouth daily. Am   No current facility-administered medications on file prior to visit.     Review of Systems  Constitutional: Negative for activity change, appetite change, chills, diaphoresis, fatigue and fever.  HENT: Negative for congestion and hearing loss.   Eyes: Negative for visual disturbance.  Respiratory: Negative for cough, chest tightness, shortness of breath and wheezing.   Cardiovascular: Negative for chest pain, palpitations and leg swelling.  Gastrointestinal: Negative for abdominal pain, constipation, diarrhea, nausea and vomiting.  Genitourinary: Negative for dysuria, frequency and hematuria.  Musculoskeletal: Negative for arthralgias and neck pain.  Skin: Negative for rash.  Neurological: Negative for dizziness, weakness, light-headedness, numbness and headaches.  Hematological: Negative for adenopathy.  Psychiatric/Behavioral: Negative for behavioral problems, dysphoric mood  and sleep disturbance.   Per HPI unless specifically indicated above     Objective:    BP 128/84 (BP Location: Left Arm, Cuff Size: Normal)   Pulse 80   Temp 97.9 F (36.6 C) (Oral)   Resp 16   Ht 6' (1.829 m)   Wt 184 lb (83.5 kg)   BMI 24.95 kg/m   Wt Readings from Last 3 Encounters:  04/03/19 184 lb (83.5 kg)  10/02/18 186 lb (84.4 kg)  06/22/18 180 lb 9.6 oz (81.9 kg)    Physical Exam Vitals signs and nursing note reviewed.  Constitutional:      General: He is not in acute distress.    Appearance: He is well-developed. He is not diaphoretic.     Comments: Well-appearing,  comfortable, cooperative  HENT:     Head: Normocephalic and atraumatic.  Eyes:     General:        Right eye: No discharge.        Left eye: No discharge.     Conjunctiva/sclera: Conjunctivae normal.     Pupils: Pupils are equal, round, and reactive to light.  Neck:     Musculoskeletal: Normal range of motion and neck supple.     Thyroid: No thyromegaly.  Cardiovascular:     Rate and Rhythm: Normal rate and regular rhythm.     Heart sounds: Normal heart sounds. No murmur.  Pulmonary:     Effort: Pulmonary effort is normal. No respiratory distress.     Breath sounds: Normal breath sounds. No wheezing or rales.  Abdominal:     General: Bowel sounds are normal. There is no distension.     Palpations: Abdomen is soft. There is no mass.     Tenderness: There is no abdominal tenderness.  Musculoskeletal: Normal range of motion.        General: No tenderness.     Comments: Upper / Lower Extremities: - Normal muscle tone, strength bilateral upper extremities 5/5, lower extremities 5/5  Lymphadenopathy:     Cervical: No cervical adenopathy.  Skin:    General: Skin is warm and dry.     Findings: No erythema or rash.  Neurological:     Mental Status: He is alert and oriented to person, place, and time.     Comments: Distal sensation intact to light touch all extremities  Psychiatric:         Behavior: Behavior normal.     Comments: Well groomed, good eye contact, normal speech and thoughts        Results for orders placed or performed in visit on 03/27/19  Hepatitis C antibody  Result Value Ref Range   Hepatitis C Ab NON-REACTIVE NON-REACTI   SIGNAL TO CUT-OFF 0.05 <1.00  HIV Antibody (routine testing w rflx)  Result Value Ref Range   HIV 1&2 Ab, 4th Generation NON-REACTIVE NON-REACTI  T4, free  Result Value Ref Range   Free T4 1.1 0.8 - 1.8 ng/dL  TSH  Result Value Ref Range   TSH 2.78 0.40 - 4.50 mIU/L  PSA  Result Value Ref Range   PSA 0.9 < OR = 4.0 ng/mL  Lipid panel  Result Value Ref Range   Cholesterol 217 (H) <200 mg/dL   HDL 38 (L) > OR = 40 mg/dL   Triglycerides 295 (H) <150 mg/dL   LDL Cholesterol (Calc) 137 (H) mg/dL (calc)   Total CHOL/HDL Ratio 5.7 (H) <5.0 (calc)   Non-HDL Cholesterol (Calc) 179 (H) <130 mg/dL (calc)  COMPLETE METABOLIC PANEL WITH GFR  Result Value Ref Range   Glucose, Bld 105 (H) 65 - 99 mg/dL   BUN 17 7 - 25 mg/dL   Creat 1.07 0.70 - 1.25 mg/dL   GFR, Est Non African American 73 > OR = 60 mL/min/1.37m2   GFR, Est African American 85 > OR = 60 mL/min/1.44m2   BUN/Creatinine Ratio NOT APPLICABLE 6 - 22 (calc)   Sodium 141 135 - 146 mmol/L   Potassium 4.1 3.5 - 5.3 mmol/L   Chloride 106 98 - 110 mmol/L   CO2 28 20 - 32 mmol/L   Calcium 10.2 8.6 - 10.3 mg/dL   Total Protein 6.8 6.1 - 8.1 g/dL   Albumin 4.3 3.6 - 5.1 g/dL   Globulin 2.5 1.9 -  3.7 g/dL (calc)   AG Ratio 1.7 1.0 - 2.5 (calc)   Total Bilirubin 1.3 (H) 0.2 - 1.2 mg/dL   Alkaline phosphatase (APISO) 67 35 - 144 U/L   AST 17 10 - 35 U/L   ALT 12 9 - 46 U/L  CBC with Differential/Platelet  Result Value Ref Range   WBC 7.2 3.8 - 10.8 Thousand/uL   RBC 5.29 4.20 - 5.80 Million/uL   Hemoglobin 17.3 (H) 13.2 - 17.1 g/dL   HCT 51.6 (H) 38.5 - 50.0 %   MCV 97.5 80.0 - 100.0 fL   MCH 32.7 27.0 - 33.0 pg   MCHC 33.5 32.0 - 36.0 g/dL   RDW 13.1 11.0 - 15.0 %    Platelets 165 140 - 400 Thousand/uL   MPV 12.6 (H) 7.5 - 12.5 fL   Neutro Abs 3,773 1,500 - 7,800 cells/uL   Lymphs Abs 2,311 850 - 3,900 cells/uL   Absolute Monocytes 547 200 - 950 cells/uL   Eosinophils Absolute 511 (H) 15 - 500 cells/uL   Basophils Absolute 58 0 - 200 cells/uL   Neutrophils Relative % 52.4 %   Total Lymphocyte 32.1 %   Monocytes Relative 7.6 %   Eosinophils Relative 7.1 %   Basophils Relative 0.8 %  Hemoglobin A1c  Result Value Ref Range   Hgb A1c MFr Bld 5.4 <5.7 % of total Hgb   Mean Plasma Glucose 108 (calc)   eAG (mmol/L) 6.0 (calc)      Assessment & Plan:   Problem List Items Addressed This Visit    Centrilobular emphysema (HCC)    Clinically some mild episodic respiratory symptoms, usually triggered or provoked Former smoker quit 1.5 years ago, has done well smoke free Uses PRN albuterol flare Not on maintenance Prior imaging consistent w/ COPD  Plan - Trial on Anoro - sample x 1 given for up to 1 week, check cost / coverage of other maintenance therapy inhalers, will rx if he notifies [place] which one is covered or affordable      Relevant Medications   albuterol (VENTOLIN HFA) 108 (90 Base) MCG/ACT inhaler   benzonatate (TESSALON) 100 MG capsule   umeclidinium-vilanterol (ANORO ELLIPTA) 62.5-25 MCG/INH AEPB   Other Relevant Orders   Pneumococcal conjugate vaccine 13-valent IM (Completed)   Elevated hemoglobin A1c    Well-controlled Pre-DM with A1c 5.4 Concern HLD  Plan:  1. Not on any therapy currently  2. Encourage improved lifestyle - low carb, low sugar diet, reduce portion size, continue improving regular exercise 3. Follow-up 6 months PreDM with labs      Former smoker   Hyperlipidemia    Improved TG on fish oil and diet changes Suggestive of familial HLD component Last lipid panel 03/2019 Calculated ASCVD 10 yr risk score down to 13-15% now former smoker  Plan: 1. Again discussed ASCVD risk - advised that we can reconsider statin  in future - however patient called back after visit to request statin, agreed, sent Rosuvastatin 10mg  nightly to his pharmacy as trial to start, adjust dose in future 2. Continue Fish Oil 3. Encourage improved lifestyle - low carb/cholesterol emphasized DASH / Mediterranean diet options, reduce portion size, improve regular exercise 4. Follow-up 6 months for repeat chemistry lipid       Other Visit Diagnoses    Annual physical exam    -  Primary   Need for vaccination with 13-polyvalent pneumococcal conjugate vaccine       Relevant Orders   Pneumococcal conjugate vaccine  13-valent IM (Completed)   Screen for colon cancer       Relevant Orders   Ambulatory referral to Gastroenterology   Polyp of colon, unspecified part of colon, unspecified type       Relevant Orders   Ambulatory referral to Gastroenterology       Updated Health Maintenance information - - Referral to [facility] for colonoscopy routine surveillance - PSA negative - Prevnar-13 pneumonia vaccine today, given COPD Reviewed recent lab results with patient Encouraged improvement to lifestyle with diet and exercise - Goal of weight loss   Meds ordered this encounter  Medications  . albuterol (VENTOLIN HFA) 108 (90 Base) MCG/ACT inhaler    Sig: Inhale 1-2 puffs into the lungs every 4 (four) hours as needed for wheezing or shortness of breath.    Dispense:  18 g    Refill:  2  . benzonatate (TESSALON) 100 MG capsule    Sig: Take 1 capsule (100 mg total) by mouth 3 (three) times daily as needed for cough.    Dispense:  30 capsule    Refill:  1  . umeclidinium-vilanterol (ANORO ELLIPTA) 62.5-25 MCG/INH AEPB    Sig: Inhale 1 puff into the lungs daily.    Dispense:  7 each    Refill:  0   Orders Placed This Encounter  Procedures  . Pneumococcal conjugate vaccine 13-valent IM  . Ambulatory referral to Gastroenterology    Referral Priority:   Routine    Referral Type:   Consultation    Referral Reason:   Specialty  Services Required    Number of Visits Requested:   1     Follow up plan: Return in about 6 months (around 10/04/2019) for 6 months lab result HLD, Sugar, COPD, HTN.   Future labs in 6 months A1c CMET Lipid - ordered  [name], DO [facility] 04/03/2019, 8:46 AM

## 2019-04-03 NOTE — Telephone Encounter (Signed)
Left message for patient to call back  

## 2019-04-04 NOTE — Assessment & Plan Note (Signed)
Improved TG on fish oil and diet changes Suggestive of familial HLD component Last lipid panel 03/2019 Calculated ASCVD 10 yr risk score down to 13-15% now former smoker  Plan: 1. Again discussed ASCVD risk - advised that we can reconsider statin in future - however patient called back after visit to request statin, agreed, sent Rosuvastatin 10mg  nightly to his pharmacy as trial to start, adjust dose in future 2. Continue Fish Oil 3. Encourage improved lifestyle - low carb/cholesterol emphasized DASH / Mediterranean diet options, reduce portion size, improve regular exercise 4. Follow-up 6 months for repeat chemistry lipid

## 2019-04-04 NOTE — Assessment & Plan Note (Signed)
Well-controlled Pre-DM with A1c 5.4 Concern HLD  Plan:  1. Not on any therapy currently  2. Encourage improved lifestyle - low carb, low sugar diet, reduce portion size, continue improving regular exercise 3. Follow-up 6 months PreDM with labs

## 2019-04-04 NOTE — Telephone Encounter (Signed)
Patient advised.

## 2019-04-04 NOTE — Assessment & Plan Note (Signed)
Clinically some mild episodic respiratory symptoms, usually triggered or provoked Former smoker quit 1.5 years ago, has done well smoke free Uses PRN albuterol flare Not on maintenance Prior imaging consistent w/ COPD  Plan - Trial on Anoro - sample x 1 given for up to 1 week, check cost / coverage of other maintenance therapy inhalers, will rx if he notifies [place] which one is covered or affordable

## 2019-04-05 ENCOUNTER — Telehealth: Payer: Self-pay | Admitting: Gastroenterology

## 2019-04-05 NOTE — Telephone Encounter (Signed)
PT Is returning your call to schedule colonoscopy

## 2019-04-06 NOTE — Telephone Encounter (Signed)
Returned patients call LVM for him to call back to schedule his colonoscopy.  Thanks [employer]

## 2019-04-17 ENCOUNTER — Other Ambulatory Visit: Payer: Self-pay

## 2019-04-17 DIAGNOSIS — Z86010 Personal history of colonic polyps: Secondary | ICD-10-CM

## 2019-04-17 DIAGNOSIS — Z1211 Encounter for screening for malignant neoplasm of colon: Secondary | ICD-10-CM

## 2019-04-17 NOTE — Progress Notes (Signed)
Gastroenterology Pre-Procedure Review  Request Date: 04/23/19 Requesting Physician: Dr. Vicente Males  PATIENT REVIEW QUESTIONS: The patient responded to the following health history questions as indicated:    1. Are you having any GI issues? no 2. Do you have a personal history of Polyps? yes (5 years ago) 3. Do you have a family history of Colon Cancer or Polyps? no 4. Diabetes Mellitus? no 5. Joint replacements in the past 12 months?no 6. Major health problems in the past 3 months?no 7. Any artificial heart valves, MVP, or defibrillator?no    MEDICATIONS & ALLERGIES:    Patient reports the following regarding taking any anticoagulation/antiplatelet therapy:   Plavix, Coumadin, Eliquis, Xarelto, Lovenox, Pradaxa, Brilinta, or Effient? no Aspirin? no  Patient confirms/reports the following medications:  Current Outpatient Medications  Medication Sig Dispense Refill  . albuterol (VENTOLIN HFA) 108 (90 Base) MCG/ACT inhaler Inhale 1-2 puffs into the lungs every 4 (four) hours as needed for wheezing or shortness of breath. 18 g 2  . Aspirin-Acetaminophen-Caffeine (EXCEDRIN PO) Take 2 tablets by mouth. am    . benzonatate (TESSALON) 100 MG capsule Take 1 capsule (100 mg total) by mouth 3 (three) times daily as needed for cough. 30 capsule 1  . naproxen (NAPROSYN) 500 MG tablet TAKE 1 TABLET (500 MG TOTAL) BY MOUTH 2 (TWO) TIMES DAILY WITH A MEAL. FOR 2-4 WEEKS THEN AS NEEDED 60 tablet 2  . rosuvastatin (CRESTOR) 10 MG tablet Take 1 tablet (10 mg total) by mouth at bedtime. 90 tablet 1  . sildenafil (REVATIO) 20 MG tablet TAKE ONE TO FIVE TABLETS BY MOUTH ABOUT 30 MINUTES PRIOR TO SEX. START WITH 1 TABLET AND INCREASE AS NEEDED 30 tablet 5  . umeclidinium-vilanterol (ANORO ELLIPTA) 62.5-25 MCG/INH AEPB Inhale 1 puff into the lungs daily. 7 each 0  . vitamin C (ASCORBIC ACID) 500 MG tablet Take 500 mg by mouth daily. Am     No current facility-administered medications for this visit.     Patient  confirms/reports the following allergies:  No Known Allergies  No orders of the defined types were placed in this encounter.   AUTHORIZATION INFORMATION Primary Insurance: 1D#: Group #:  Secondary Insurance: 1D#: Group #:  SCHEDULE INFORMATION: Date: 04/23/19 Time: Location:[facility]

## 2019-04-19 ENCOUNTER — Telehealth: Payer: Self-pay

## 2019-04-19 ENCOUNTER — Other Ambulatory Visit: Payer: Self-pay

## 2019-04-19 ENCOUNTER — Other Ambulatory Visit
Admission: RE | Admit: 2019-04-19 | Discharge: 2019-04-19 | Disposition: A | Discharge status: Home / Self Care | Payer: 59 | Source: Ambulatory Visit | Attending: Gastroenterology | Admitting: Gastroenterology

## 2019-04-19 NOTE — Telephone Encounter (Signed)
Patient contacted office to reschedule his colonoscopy due to work schedule.  He's been rescheduled to 04/26/19.  COVID Test 04/23/19. Trish in Endo has been informed.  Thanks [employer]

## 2019-04-23 ENCOUNTER — Other Ambulatory Visit
Admission: RE | Admit: 2019-04-23 | Discharge: 2019-04-23 | Disposition: A | Discharge status: Home / Self Care | Payer: 59 | Source: Ambulatory Visit | Attending: Gastroenterology | Admitting: Gastroenterology

## 2019-04-23 ENCOUNTER — Other Ambulatory Visit: Payer: Self-pay

## 2019-04-23 DIAGNOSIS — Z20828 Contact with and (suspected) exposure to other viral communicable diseases: Secondary | ICD-10-CM | POA: Insufficient documentation

## 2019-04-23 DIAGNOSIS — Z01812 Encounter for preprocedural laboratory examination: Secondary | ICD-10-CM | POA: Insufficient documentation

## 2019-04-23 DIAGNOSIS — K649 Unspecified hemorrhoids: Secondary | ICD-10-CM | POA: Diagnosis not present

## 2019-04-23 DIAGNOSIS — K635 Polyp of colon: Secondary | ICD-10-CM | POA: Insufficient documentation

## 2019-04-23 LAB — SARS CORONAVIRUS 2 (TAT 6-24 HRS): SARS Coronavirus 2: NEGATIVE

## 2019-04-25 ENCOUNTER — Encounter: Payer: Self-pay | Admitting: *Deleted

## 2019-04-26 ENCOUNTER — Encounter
Admission: RE | Disposition: A | Discharge status: Home / Self Care | Payer: Self-pay | Source: Home / Self Care | Attending: Gastroenterology

## 2019-04-26 ENCOUNTER — Ambulatory Visit: Payer: No Typology Code available for payment source | Admitting: Registered Nurse

## 2019-04-26 ENCOUNTER — Other Ambulatory Visit: Payer: Self-pay

## 2019-04-26 ENCOUNTER — Ambulatory Visit
Admission: RE | Admit: 2019-04-26 | Discharge: 2019-04-26 | Disposition: A | Discharge status: Home / Self Care | Payer: No Typology Code available for payment source | Attending: Gastroenterology | Admitting: Gastroenterology

## 2019-04-26 DIAGNOSIS — J449 Chronic obstructive pulmonary disease, unspecified: Secondary | ICD-10-CM | POA: Diagnosis not present

## 2019-04-26 DIAGNOSIS — D124 Benign neoplasm of descending colon: Secondary | ICD-10-CM | POA: Insufficient documentation

## 2019-04-26 DIAGNOSIS — M1990 Unspecified osteoarthritis, unspecified site: Secondary | ICD-10-CM | POA: Insufficient documentation

## 2019-04-26 DIAGNOSIS — K635 Polyp of colon: Secondary | ICD-10-CM

## 2019-04-26 DIAGNOSIS — D123 Benign neoplasm of transverse colon: Secondary | ICD-10-CM | POA: Diagnosis not present

## 2019-04-26 DIAGNOSIS — K640 First degree hemorrhoids: Secondary | ICD-10-CM | POA: Insufficient documentation

## 2019-04-26 DIAGNOSIS — Z7982 Long term (current) use of aspirin: Secondary | ICD-10-CM | POA: Insufficient documentation

## 2019-04-26 DIAGNOSIS — Z79899 Other long term (current) drug therapy: Secondary | ICD-10-CM | POA: Diagnosis not present

## 2019-04-26 DIAGNOSIS — Z1211 Encounter for screening for malignant neoplasm of colon: Secondary | ICD-10-CM | POA: Diagnosis not present

## 2019-04-26 DIAGNOSIS — Z860100 Personal history of colon polyps, unspecified: Secondary | ICD-10-CM

## 2019-04-26 DIAGNOSIS — D122 Benign neoplasm of ascending colon: Secondary | ICD-10-CM | POA: Diagnosis not present

## 2019-04-26 HISTORY — PX: COLONOSCOPY WITH PROPOFOL: SHX5780

## 2019-04-26 HISTORY — DX: Chronic obstructive pulmonary disease, unspecified: J44.9

## 2019-04-26 SURGERY — COLONOSCOPY WITH PROPOFOL
Anesthesia: General

## 2019-04-26 MED ORDER — PROPOFOL 500 MG/50ML IV EMUL
INTRAVENOUS | Status: AC
  Filled 2019-04-26: qty 50

## 2019-04-26 MED ORDER — MIDAZOLAM HCL 2 MG/2ML IJ SOLN
INTRAMUSCULAR | Status: DC | PRN
  Administered 2019-04-26: 2 mg via INTRAVENOUS

## 2019-04-26 MED ORDER — LIDOCAINE HCL (CARDIAC) PF 100 MG/5ML IV SOSY
PREFILLED_SYRINGE | INTRAVENOUS | Status: DC | PRN
  Administered 2019-04-26: 40 mg via INTRAVENOUS

## 2019-04-26 MED ORDER — PROPOFOL 10 MG/ML IV BOLUS
INTRAVENOUS | Status: AC
  Filled 2019-04-26: qty 20

## 2019-04-26 MED ORDER — SUCCINYLCHOLINE CHLORIDE 20 MG/ML IJ SOLN
INTRAMUSCULAR | Status: AC
  Filled 2019-04-26: qty 1

## 2019-04-26 MED ORDER — SODIUM CHLORIDE 0.9 % IV SOLN
INTRAVENOUS | Status: DC
  Administered 2019-04-26: 08:00:00 via INTRAVENOUS

## 2019-04-26 MED ORDER — PROPOFOL 500 MG/50ML IV EMUL
INTRAVENOUS | Status: DC | PRN
  Administered 2019-04-26: 150 ug/kg/min via INTRAVENOUS

## 2019-04-26 MED ORDER — PROPOFOL 10 MG/ML IV BOLUS
INTRAVENOUS | Status: DC | PRN
  Administered 2019-04-26: 100 mg via INTRAVENOUS

## 2019-04-26 MED ORDER — MIDAZOLAM HCL 2 MG/2ML IJ SOLN
INTRAMUSCULAR | Status: AC
  Filled 2019-04-26: qty 2

## 2019-04-26 MED ORDER — PHENYLEPHRINE HCL (PRESSORS) 10 MG/ML IV SOLN
INTRAVENOUS | Status: DC | PRN
  Administered 2019-04-26: 100 ug via INTRAVENOUS

## 2019-04-26 MED ORDER — LIDOCAINE HCL (PF) 2 % IJ SOLN
INTRAMUSCULAR | Status: AC
  Filled 2019-04-26: qty 10

## 2019-04-26 NOTE — H&P (Signed)
Ethan Bellows, MD [address], [place], [place], [place], [address] [address], [place], [place], [place], [address] Phone: [phone]  Fax: [phone]  Primary Care Physician:  Olin Hauser, DO   Pre-Procedure History & Physical: HPI:  Ethan Fitzgerald is a 63 y.o. male is here for an colonoscopy.   Past Medical History:  Diagnosis Date  . COPD (chronic obstructive pulmonary disease) (HCC)    mild  . Erectile dysfunction   . Headache    daily, stress headaches  . Knee pain, left   . Seasonal allergies   . Tobacco dependence     Past Surgical History:  Procedure Laterality Date  . BACK SURGERY     metal plate in back  . COLONOSCOPY    . HERNIA REPAIR Right   . KNEE ARTHROSCOPY Left 03/28/2015   Procedure: Arthroscopic partial medial meniscectomy plus chondral debridement;  Surgeon: Leanor Kail, MD;  Location: [facility];  Service: Orthopedics;  Laterality: Left;    Prior to Admission medications   Medication Sig Start Date End Date Taking? Authorizing Provider  albuterol (VENTOLIN HFA) 108 (90 Base) MCG/ACT inhaler Inhale 1-2 puffs into the lungs every 4 (four) hours as needed for wheezing or shortness of breath. 04/03/19  Yes Karamalegos, Devonne Doughty, DO  benzonatate (TESSALON) 100 MG capsule Take 1 capsule (100 mg total) by mouth 3 (three) times daily as needed for cough. 04/03/19  Yes Karamalegos, Alexander J, DO  naproxen (NAPROSYN) 500 MG tablet TAKE 1 TABLET (500 MG TOTAL) BY MOUTH 2 (TWO) TIMES DAILY WITH A MEAL. FOR 2-4 WEEKS THEN AS NEEDED 03/30/19  Yes Karamalegos, Devonne Doughty, DO  rosuvastatin (CRESTOR) 10 MG tablet Take 1 tablet (10 mg total) by mouth at bedtime. 04/03/19  Yes Karamalegos, Alexander J, DO  sildenafil (REVATIO) 20 MG tablet TAKE ONE TO FIVE TABLETS BY MOUTH ABOUT 30 MINUTES PRIOR TO SEX. START WITH 1 TABLET AND INCREASE AS NEEDED 10/02/18  Yes Karamalegos, Devonne Doughty, DO  umeclidinium-vilanterol (ANORO ELLIPTA) 62.5-25  MCG/INH AEPB Inhale 1 puff into the lungs daily. 04/03/19  Yes Karamalegos, Devonne Doughty, DO  vitamin C (ASCORBIC ACID) 500 MG tablet Take 500 mg by mouth daily. Am   Yes [provider]  Aspirin-Acetaminophen-Caffeine (EXCEDRIN PO) Take 2 tablets by mouth. am    [provider]    Allergies as of 04/18/2019  . (No Known Allergies)    Family History  Problem Relation Age of Onset  . Diabetes Mother   . Heart attack Mother 37  . Cancer Father        liver   . COPD Brother   . HIV/AIDS Brother   . Prostate cancer Neg Hx   . Colon cancer Neg Hx     Social History   Socioeconomic History  . Marital status: Married    Spouse name: Not on file  . Number of children: Not on file  . Years of education: Not on file  . Highest education level: Not on file  Occupational History  . Not on file  Social Needs  . Financial resource strain: Not on file  . Food insecurity    Worry: Not on file    Inability: Not on file  . Transportation needs    Medical: Not on file    Non-medical: Not on file  Tobacco Use  . Smoking status: Former Smoker    Packs/day: 0.25    Years: 40.00    Pack years: 10.00  Types: Cigarettes    Quit date: 09/14/2017    Years since quitting: 1.6  . Smokeless tobacco: Former Network engineer and Sexual Activity  . Alcohol use: Yes    Comment: 6 pack on weekend  . Drug use: No  . Sexual activity: Not on file  Lifestyle  . Physical activity    Days per week: Not on file    Minutes per session: Not on file  . Stress: Not on file  Relationships  . Social Herbalist on phone: Not on file    Gets together: Not on file    Attends religious service: Not on file    Active member of club or organization: Not on file    Attends meetings of clubs or organizations: Not on file    Relationship status: Not on file  . Intimate partner violence    Fear of current or ex partner: Not on file    Emotionally abused: Not on file    Physically  abused: Not on file    Forced sexual activity: Not on file  Other Topics Concern  . Not on file  Social History Narrative  . Not on file    Review of Systems: See HPI, otherwise negative ROS  Physical Exam: BP (!) 142/90   Pulse 76   Temp 97.7 F (36.5 C) (Oral)   Resp 18   Ht 6' (1.829 m)   Wt 82.6 kg   SpO2 99%   BMI 24.68 kg/m  General:   Alert,  pleasant and cooperative in NAD Head:  Normocephalic and atraumatic. Neck:  Supple; no masses or thyromegaly. Lungs:  Clear throughout to auscultation, normal respiratory effort.    Heart:  +S1, +S2, Regular rate and rhythm, No edema. Abdomen:  Soft, nontender and nondistended. Normal bowel sounds, without guarding, and without rebound.   Neurologic:  Alert and  oriented x4;  grossly normal neurologically.  Impression/Plan: Ethan Fitzgerald is here for an colonoscopy to be performed for surveillance due to prior history of colon polyps   Risks, benefits, limitations, and alternatives regarding  colonoscopy have been reviewed with the patient.  Questions have been answered.  All parties agreeable.   Ethan Bellows, MD  04/26/2019, 8:11 AM

## 2019-04-26 NOTE — Anesthesia Preprocedure Evaluation (Addendum)
Anesthesia Evaluation  Patient identified by MRN, date of birth, ID band  Reviewed: NPO status   History of Anesthesia Complications Negative for: history of anesthetic complications  Airway Mallampati: II  TM Distance: >3 FB Neck ROM: full    Dental  (+) Edentulous Upper, Edentulous Lower   Pulmonary COPD, Current Smoker, former smoker,    Pulmonary exam normal        Cardiovascular Exercise Tolerance: Good negative cardio ROS Normal cardiovascular exam     Neuro/Psych  Headaches, negative psych ROS   GI/Hepatic negative GI ROS, Neg liver ROS,   Endo/Other  neg diabetes (denies)  Renal/GU negative Renal ROS  negative genitourinary   Musculoskeletal  (+) Arthritis , Osteoarthritis,    Abdominal   Peds  Hematology negative hematology ROS (+)   Anesthesia Other Findings   Reproductive/Obstetrics                             Anesthesia Physical  Anesthesia Plan  ASA: II  Anesthesia Plan: General   Post-op Pain Management:    Induction:   PONV Risk Score and Plan:   Airway Management Planned:   Additional Equipment:   Intra-op Plan:   Post-operative Plan:   Informed Consent: I have reviewed the patients History and Physical, chart, labs and discussed the procedure including the risks, benefits and alternatives for the proposed anesthesia with the patient or authorized representative who has indicated his/her understanding and acceptance.       Plan Discussed with: CRNA  Anesthesia Plan Comments:        Anesthesia Quick Evaluation

## 2019-04-26 NOTE — Transfer of Care (Signed)
Immediate Anesthesia Transfer of Care Note  Patient: Ethan Fitzgerald  Procedure(s) Performed: Procedure(s): COLONOSCOPY WITH PROPOFOL (N/A)  Patient Location: PACU and Endoscopy Unit  Anesthesia Type:General  Level of Consciousness: sedated  Airway & Oxygen Therapy: Patient Spontanous Breathing and Patient connected to nasal cannula oxygen  Post-op Assessment: Report given to RN and Post -op Vital signs reviewed and stable  Post vital signs: Reviewed and stable  Last Vitals:  Vitals:   04/26/19 0728 04/26/19 0857  BP: (!) 142/90 122/79  Pulse: 76 66  Resp: 18 19  Temp: 36.5 C (!) 36.3 C  SpO2: 44% 36%    Complications: No apparent anesthesia complications

## 2019-04-26 NOTE — Anesthesia Procedure Notes (Signed)
Date/Time: 04/26/2019 8:15 AM Performed by: Doreen Salvage, CRNA Pre-anesthesia Checklist: Patient identified, Emergency Drugs available, Suction available and Patient being monitored Patient Re-evaluated:Patient Re-evaluated prior to induction Oxygen Delivery Method: Nasal cannula Induction Type: IV induction Dental Injury: Teeth and Oropharynx as per pre-operative assessment  Comments: Nasal cannula with etCO2 monitoring

## 2019-04-26 NOTE — Anesthesia Postprocedure Evaluation (Signed)
Anesthesia Post Note  Patient: Ethan Fitzgerald  Procedure(s) Performed: COLONOSCOPY WITH PROPOFOL (N/A )  Patient location during evaluation: Endoscopy Anesthesia Type: General Level of consciousness: awake and alert and oriented Pain management: pain level controlled Vital Signs Assessment: post-procedure vital signs reviewed and stable Respiratory status: spontaneous breathing Cardiovascular status: blood pressure returned to baseline Anesthetic complications: no     Last Vitals:  Vitals:   04/26/19 0728 04/26/19 0857  BP: (!) 142/90 122/79  Pulse: 76 66  Resp: 18 19  Temp: 36.5 C (!) 36.3 C  SpO2: 99% 96%    Last Pain:  Vitals:   04/26/19 0927  TempSrc:   PainSc: 0-No pain                 Jakyiah Briones

## 2019-04-26 NOTE — Op Note (Signed)
[facility] Gastroenterology Patient Name: Ethan Fitzgerald Procedure Date: 04/26/2019 7:52 AM MRN: [id number] Account #: [id number] Date of Birth: 1955/10/29 Admit Type: Outpatient Age: 63 Room: [facility] ENDO ROOM 3 Gender: Male Note Status: Finalized Procedure:            Colonoscopy Indications:          High risk colon cancer surveillance: Personal history                        of colonic polyps Providers:            Jonathon Bellows MD, MD Referring MD:         Olin Hauser (Referring MD) Medicines:            Monitored Anesthesia Care Complications:        No immediate complications. Procedure:            Pre-Anesthesia Assessment:                       - Prior to the procedure, a History and Physical was                        performed, and patient medications, allergies and                        sensitivities were reviewed. The patient's tolerance of                        previous anesthesia was reviewed.                       - The risks and benefits of the procedure and the                        sedation options and risks were discussed with the                        patient. All questions were answered and informed                        consent was obtained.                       - ASA Grade Assessment: II - A patient with mild                        systemic disease.                       After obtaining informed consent, the colonoscope was                        passed under direct vision. Throughout the procedure,                        the patient's blood pressure, pulse, and oxygen                        saturations were monitored continuously. The                        Colonoscope  was introduced through the anus and                        advanced to the the cecum, identified by the                        appendiceal orifice. The colonoscopy was performed with                        ease. The patient tolerated the procedure well. The                     quality of the bowel preparation was fair. Findings:      The perianal and digital rectal examinations were normal.      Five sessile polyps were found in the ascending colon. The polyps were 6       to 8 mm in size. These polyps were removed with a cold snare. Resection       and retrieval were complete.      Five sessile polyps were found in the transverse colon. The polyps were       4 to 7 mm in size. These polyps were removed with a cold snare.       Resection and retrieval were complete.      Two sessile polyps were found in the descending colon. The polyps were 4       to 6 mm in size. These polyps were removed with a cold snare. Resection       was complete, but the polyp tissue was only partially retrieved.      The exam was otherwise without abnormality on direct and retroflexion       views.      Non-bleeding internal hemorrhoids were found during retroflexion. The       hemorrhoids were large and Grade I (internal hemorrhoids that do not       prolapse). Impression:           - Preparation of the colon was fair.                       - Five 6 to 8 mm polyps in the ascending colon, removed                        with a cold snare. Resected and retrieved.                       - Five 4 to 7 mm polyps in the transverse colon,                        removed with a cold snare. Resected and retrieved.                       - Two 4 to 6 mm polyps in the descending colon, removed                        with a cold snare. Complete resection. Partial                        retrieval.                       -  The examination was otherwise normal on direct and                        retroflexion views.                       - Non-bleeding internal hemorrhoids. Recommendation:       - Discharge patient to home (with escort).                       - Resume previous diet.                       - Continue present medications.                       - Await pathology  results.                       - Repeat colonoscopy in 8 months for surveillance. Procedure Code(s):    --- Professional ---                       [phone], Colonoscopy, flexible; with removal of tumor(s),                        polyp(s), or other lesion(s) by snare technique Diagnosis Code(s):    --- Professional ---                       Z86.010, Personal history of colonic polyps                       K64.0, First degree hemorrhoids                       K63.5, Polyp of colon CPT copyright 2019 American Medical Association. All rights reserved. The codes documented in this report are preliminary and upon coder review may  be revised to meet current compliance requirements. Jonathon Bellows, MD Jonathon Bellows MD, MD 04/26/2019 8:54:23 AM This report has been signed electronically. Number of Addenda: 0 Note Initiated On: 04/26/2019 7:52 AM Scope Withdrawal Time: 0 hours 25 minutes 58 seconds  Total Procedure Duration: 0 hours 29 minutes 36 seconds  Estimated Blood Loss: Estimated blood loss: none.      [facility]

## 2019-04-26 NOTE — Anesthesia Post-op Follow-up Note (Signed)
Anesthesia QCDR form completed.        

## 2019-04-27 ENCOUNTER — Encounter: Payer: Self-pay | Admitting: Gastroenterology

## 2019-04-27 LAB — SURGICAL PATHOLOGY

## 2019-04-29 ENCOUNTER — Encounter: Payer: Self-pay | Admitting: Gastroenterology

## 2019-05-08 ENCOUNTER — Encounter: Payer: Self-pay | Admitting: Family Medicine

## 2019-05-08 ENCOUNTER — Other Ambulatory Visit: Payer: Self-pay

## 2019-05-08 ENCOUNTER — Ambulatory Visit (INDEPENDENT_AMBULATORY_CARE_PROVIDER_SITE_OTHER): Payer: 59 | Admitting: Family Medicine

## 2019-05-08 VITALS — BP 118/85 | HR 86 | Temp 97.9°F | Resp 18 | Ht 72.0 in | Wt 186.6 lb

## 2019-05-08 DIAGNOSIS — M5441 Lumbago with sciatica, right side: Secondary | ICD-10-CM | POA: Diagnosis not present

## 2019-05-08 MED ORDER — CYCLOBENZAPRINE HCL 10 MG PO TABS: 10.0000 mg | ORAL_TABLET | Freq: Three times a day (TID) | ORAL | 1 refills | Status: DC | PRN

## 2019-05-08 MED ORDER — PREDNISONE 10 MG PO TABS: ORAL_TABLET | ORAL | 0 refills | Status: DC

## 2019-05-08 NOTE — Progress Notes (Signed)
Subjective:    [name], male    DOB: [birth year], 63 y.o.   MRN: [id number]  Ethan Fitzgerald is a 63 y.o. male presenting on 05/08/2019 for Back Pain (lower back pain x4 days)  Patient presents for a same day appointment.  HPI   LOW BACK PAIN, Right with sciatica - Reports symptoms started about 4 days ago without inciting injury but he did admit to frequent repetitive lifting and using back likely causing. Today seems to be persistent vs worsening. Describes pain as deeper aching pain but with episodes of sharp more severe pain with radiation down into R hip and thigh. - Taking Naprosyn 500 BID for 1-2 days without improvement - Tried heating pad at night, ice, muscle - History of lumbar OA/DJD, no prior back surgery - Prior similar back pain flares improved with NSAID, rest, prednisone - Admits difficulty sleeping due to pain at times if move wrong - Denies any fevers/chills, numbness, tingling, weakness, loss of control bladder/bowel incontinence or retention, unintentional wt loss, night sweats   Health Maintenance: Screening Colon CA - last done colonoscopy 04/26/19 per Dr Vicente Males [facility], found 12 polyps, pre-cancerous tubular adenoma, no high grade or concerning lesions but he was advised repeat in 8 months due to # of adenomas, reviewed these results with patient and recommended close follow-up   Depression screen [place] 2/9 05/08/2019 04/03/2019 10/02/2018  Decreased Interest 0 0 0  Down, Depressed, Hopeless 0 0 0  PHQ - 2 Score 0 0 0    Social History   Tobacco Use  . Smoking status: Former Smoker    Packs/day: 0.25    Years: 40.00    Pack years: 10.00    Types: Cigarettes    Quit date: 09/14/2017    Years since quitting: 1.6  . Smokeless tobacco: Former Network engineer Use Topics  . Alcohol use: Yes    Comment: 6 pack on weekend  . Drug use: No    Review of Systems Per HPI unless specifically indicated above     Objective:    BP 118/85 (BP Location:  Left Arm, Patient Position: Sitting, Cuff Size: Normal)   Pulse 86   Temp 97.9 F (36.6 C) (Oral)   Resp 18   Ht 6' (1.829 m)   Wt 186 lb 9.6 oz (84.6 kg)   SpO2 98%   BMI 25.31 kg/m   Wt Readings from Last 3 Encounters:  05/08/19 186 lb 9.6 oz (84.6 kg)  04/26/19 182 lb (82.6 kg)  04/03/19 184 lb (83.5 kg)    Physical Exam Vitals signs and nursing note reviewed.  Constitutional:      General: He is not in acute distress.    Appearance: He is well-developed. He is not diaphoretic.     Comments: Well-appearing, comfortable, cooperative  HENT:     Head: Normocephalic and atraumatic.  Eyes:     General:        Right eye: No discharge.        Left eye: No discharge.     Conjunctiva/sclera: Conjunctivae normal.  Cardiovascular:     Rate and Rhythm: Normal rate.  Pulmonary:     Effort: Pulmonary effort is normal.  Musculoskeletal:     Comments: Low Back Inspection: Normal appearance, no spinal deformity, symmetrical. Palpation: No tenderness over spinous processes. RIGHT lower lumbar paraspinal with hypertonicity and spasm, mild tender, triggered pain on movement radiation ROM: Reduced rotation and back extension causing pain Special Testing: Seated  SLR with reproduced localized  Strength: Bilateral hip flex/ext 5/5, knee flex/ext 5/5, ankle dorsiflex/plantarflex 5/5 Neurovascular: intact distal sensation to light touch   Skin:    General: Skin is warm and dry.     Findings: No erythema or rash.  Neurological:     Mental Status: He is alert and oriented to person, place, and time.  Psychiatric:        Behavior: Behavior normal.     Comments: Well groomed, good eye contact, normal speech and thoughts    Results for orders placed or performed during the hospital encounter of 04/26/19  Surgical pathology  Result Value Ref Range   SURGICAL PATHOLOGY      Surgical Pathology CASE: [id number] PATIENT: Norm Licea Surgical Pathology Report     SPECIMEN SUBMITTED:  A. Colon polyp x5, ascending; cold snare B. Colon polyp x5, transverse; cold snare C. Colon polyp x2, descending; cold snare  CLINICAL HISTORY: None provided  PRE-OPERATIVE DIAGNOSIS: Screening colonoscopy, history of colon polyps  POST-OPERATIVE DIAGNOSIS: Colon polyps     DIAGNOSIS: A.  COLON POLYP X5, ASCENDING; COLD SNARE: - TUBULAR ADENOMA, 5 FRAGMENTS. - NEGATIVE FOR HIGH-GRADE DYSPLASIA AND MALIGNANCY.  B.  COLON POLYP X5, TRANSVERSE; COLD SNARE: - TUBULAR ADENOMA, 5 FRAGMENTS. - NEGATIVE FOR HIGH-GRADE DYSPLASIA AND MALIGNANCY.  C.  COLON POLYP X2, DESCENDING; COLD SNARE: - TUBULAR ADENOMA, 2 FRAGMENTS. - NEGATIVE FOR HIGH-GRADE DYSPLASIA AND MALIGNANCY.  GROSS DESCRIPTION: A. Labeled: Ascending colon polyp cold snare x5 Received: Formalin Tissue fragment(s): Multiple Size: Aggregate, 1.6 x 0.8 x 0.2 cm Description: Tan soft t issue fragments admixed with minimal, fecal material Entirely submitted in 1 cassette.  B. Labeled: Transverse colon polyp cold snare x5 Received: Formalin Tissue fragment(s): Multiple Size: Aggregate, 2.0 x 0.7 x 0.3 cm Description: Tan soft tissue fragments admixed with minimal, fecal material Entirely submitted in 1 cassette.  C. Labeled: Descending colon polyp cold snare x2 Received: Formalin Tissue fragment(s): Multiple Size: Aggregate, 1.0 x 0.3 x 0.1 cm Description: Tan soft tissue fragments admixed with fecal material Entirely submitted in 1 cassette.    Final Diagnosis performed by Betsy Pries, MD.   Electronically signed 04/27/2019 9:12:32AM The electronic signature indicates that the named Attending Pathologist has evaluated the specimen  Technical component performed at [facility], [address] [place], [place] [address] Lab: [phone] Dir: Rush Farmer, MD, MMM  Professional component performed at [facility], [facility], [place], [place], [place] [address] Lab: [phone] Dir:  Dellia Nims. Rubinas, MD       Assessment & Plan:   Problem List Items Addressed This Visit    None    Visit Diagnoses    Acute right-sided low back pain with right-sided sciatica    -  Primary   Relevant Medications   cyclobenzaprine (FLEXERIL) 10 MG tablet   predniSONE (DELTASONE) 10 MG tablet      Acute on chronic R LBP with associated R sciatica. Suspect likely due to muscle spasm/strain, without known injury or trauma. In setting of known chronic LBP with DJD - No red flag symptoms - Inadequate conservative therapy   Plan: 1. Start Prednisone burst 60 to 10mg  over 6 days as discussed, risk side effects high pressure sugar - Then transition to existing Naprosyn 500 BID 1-2 weeks 2. Start muscle relaxant with Flexeril 10mg  tabs - take 5-10mg  up to TID PRN, titrate up as tolerated, caution sedation 3. May use Tylenol PRN for breakthrough 4. Encouraged use of heating pad 1-2x  daily for now then PRN 5.  Follow-up 4-6 weeks if not improved for re-evaluation, consider X-ray imaging, trial of PT, and possibly referral to Orthopedic   Meds ordered this encounter  Medications  . cyclobenzaprine (FLEXERIL) 10 MG tablet    Sig: Take 1 tablet (10 mg total) by mouth 3 (three) times daily as needed for muscle spasms.    Dispense:  60 tablet    Refill:  1  . predniSONE (DELTASONE) 10 MG tablet    Sig: Take 6 tabs with breakfast Day 1, 5 tabs Day 2, 4 tabs Day 3, 3 tabs Day 4, 2 tabs Day 5, 1 tab Day 6.    Dispense:  21 tablet    Refill:  0      Follow up plan: Return in about 4 weeks (around 06/05/2019), or if symptoms worsen or fail to improve, for back pain.   [name], [facility] 05/08/2019, 3:46 PM

## 2019-05-08 NOTE — Patient Instructions (Addendum)
Thank you for coming to the office today.  Please schedule and return for a NURSE ONLY VISIT for VACCINE - Approximately around 1-2 weeks - Need Regular Dose Flu Vaccine  -----------------------------------  Reminder for Colonoscopy as discussed, your polyps were pre-cancerous adenoma, these were treated, you did have up to 12, need close surveillance now with repeat in 8 months, f/u with GI as discussed.  -----------------------------------  1. For your Back Pain - I think that this is due to Muscle Spasms or strain. Your Sciatic Nerve can be affected causing some of your radiation and numbness down your legs.  - Start Prednisone taper (steroid anti-inflammatory) for nerve irritation with pain in legs. Each pill is 10mg . Take 6 pills (60mg  daily) for 1 day at same time with breakfast, then each day reduce dose by 1 pill, so 5 pills, then 4, then 3, then 2 then 1 (last 6 days). Do not take any Ibuprofen or Aleve while taking the Prednisone.  - Once finished Prednisone, then start with other anti-inflammatory Naproxen 500mg  one pill with food every 12 hours (or 2 times a day) for up to 1-2 more weeks  3. Start Cyclobenzapine (Flexeril) 10mg  tablets - cut in half for 5mg  at night for muscle relaxant - may make you sedated or sleepy (be careful driving or working on this) if tolerated you can take every 8 hours, half or whole tab 4. May use Tylenol Extra Str 500mg  tabs - may take 1-2 tablets every 6 hours as needed 5. Recommend to start using heating pad on your lower back 1-2x daily for few weeks  Also try a Wedge Seat Cushion to avoid nerve pinching when sitting prolonged period of time.  This pain may take weeks to months to fully resolve, but hopefully it will respond to the medicine initially. All back injuries (small or serious) are slow to heal since we use our back muscles every day. Be careful with turning, twisting, lifting, sitting / standing for prolonged periods, and avoid  re-injury.  If your symptoms significantly worsen with more pain, or new symptoms with weakness in one or both legs, new or different shooting leg pains, numbness in legs or groin, loss of control or retention of urine or bowel movements, please call back for advice and you may need to go directly to the Emergency Department.   Please schedule a Follow-up Appointment to: Return in about 4 weeks (around 06/05/2019), or if symptoms worsen or fail to improve, for back pain.  If you have any other questions or concerns, please feel free to call the office or send a message through [place]. You may also schedule an earlier appointment if necessary.  Additionally, you may be receiving a survey about your experience at our office within a few days to 1 week by e-mail or mail. We value your feedback.  [name], DO [place]

## 2019-09-07 DIAGNOSIS — U071 COVID-19: Secondary | ICD-10-CM

## 2019-09-07 HISTORY — DX: COVID-19: U07.1

## 2019-09-23 DIAGNOSIS — Z20828 Contact with and (suspected) exposure to other viral communicable diseases: Secondary | ICD-10-CM | POA: Diagnosis not present

## 2019-10-01 ENCOUNTER — Other Ambulatory Visit: Payer: 59

## 2019-10-05 ENCOUNTER — Ambulatory Visit: Payer: 59 | Admitting: Family Medicine

## 2019-10-21 ENCOUNTER — Other Ambulatory Visit: Payer: Self-pay | Admitting: Family Medicine

## 2019-10-21 DIAGNOSIS — N529 Male erectile dysfunction, unspecified: Secondary | ICD-10-CM

## 2019-10-21 DIAGNOSIS — E782 Mixed hyperlipidemia: Secondary | ICD-10-CM

## 2019-10-23 ENCOUNTER — Other Ambulatory Visit: Payer: Self-pay | Admitting: Family Medicine

## 2019-10-23 DIAGNOSIS — N529 Male erectile dysfunction, unspecified: Secondary | ICD-10-CM

## 2019-10-29 ENCOUNTER — Other Ambulatory Visit: Payer: Self-pay

## 2019-10-29 DIAGNOSIS — E782 Mixed hyperlipidemia: Secondary | ICD-10-CM | POA: Diagnosis not present

## 2019-10-29 DIAGNOSIS — R7309 Other abnormal glucose: Secondary | ICD-10-CM | POA: Diagnosis not present

## 2019-10-30 LAB — COMPLETE METABOLIC PANEL WITH GFR
AG Ratio: 1.6 (calc) (ref 1.0–2.5)
ALT: 12 U/L (ref 9–46)
AST: 16 U/L (ref 10–35)
Albumin: 3.9 g/dL (ref 3.6–5.1)
Alkaline phosphatase (APISO): 73 U/L (ref 35–144)
BUN: 13 mg/dL (ref 7–25)
CO2: 26 mmol/L (ref 20–32)
Calcium: 9.3 mg/dL (ref 8.6–10.3)
Chloride: 105 mmol/L (ref 98–110)
Creat: 0.99 mg/dL (ref 0.70–1.25)
GFR, Est African American: 92 mL/min/{1.73_m2} (ref >=60)
GFR, Est Non African American: 80 mL/min/{1.73_m2} (ref >=60)
Globulin: 2.5 g/dL (calc) (ref 1.9–3.7)
Glucose, Bld: 142 mg/dL — ABNORMAL HIGH (ref 65–99)
Potassium: 4.4 mmol/L (ref 3.5–5.3)
Sodium: 139 mmol/L (ref 135–146)
Total Bilirubin: 1 mg/dL (ref 0.2–1.2)
Total Protein: 6.4 g/dL (ref 6.1–8.1)

## 2019-10-30 LAB — LIPID PANEL
Cholesterol: 231 mg/dL — ABNORMAL HIGH (ref <200)
HDL: 38 mg/dL — ABNORMAL LOW (ref >=40)
Non-HDL Cholesterol (Calc): 193 mg/dL (calc) — ABNORMAL HIGH (ref <130)
Total CHOL/HDL Ratio: 6.1 (calc) — ABNORMAL HIGH (ref <5.0)
Triglycerides: 415 mg/dL — ABNORMAL HIGH (ref <150)

## 2019-10-30 LAB — HEMOGLOBIN A1C
Hgb A1c MFr Bld: 5.6 % of total Hgb (ref <5.7)
Mean Plasma Glucose: 114 (calc)
eAG (mmol/L): 6.3 (calc)

## 2019-11-05 ENCOUNTER — Other Ambulatory Visit: Payer: Self-pay

## 2019-11-05 ENCOUNTER — Other Ambulatory Visit: Payer: Self-pay | Admitting: Family Medicine

## 2019-11-05 ENCOUNTER — Ambulatory Visit (INDEPENDENT_AMBULATORY_CARE_PROVIDER_SITE_OTHER): Payer: Medicare HMO | Admitting: Family Medicine

## 2019-11-05 ENCOUNTER — Encounter: Payer: Self-pay | Admitting: Family Medicine

## 2019-11-05 VITALS — BP 128/67 | HR 83 | Temp 97.3°F | Resp 16 | Ht 72.0 in | Wt 185.0 lb

## 2019-11-05 DIAGNOSIS — E782 Mixed hyperlipidemia: Secondary | ICD-10-CM

## 2019-11-05 DIAGNOSIS — Z8616 Personal history of COVID-19: Secondary | ICD-10-CM | POA: Diagnosis not present

## 2019-11-05 DIAGNOSIS — J432 Centrilobular emphysema: Secondary | ICD-10-CM | POA: Diagnosis not present

## 2019-11-05 DIAGNOSIS — R7309 Other abnormal glucose: Secondary | ICD-10-CM

## 2019-11-05 DIAGNOSIS — Z87891 Personal history of nicotine dependence: Secondary | ICD-10-CM | POA: Diagnosis not present

## 2019-11-05 DIAGNOSIS — R351 Nocturia: Secondary | ICD-10-CM

## 2019-11-05 DIAGNOSIS — Z0000 Encounter for general adult medical examination without abnormal findings: Secondary | ICD-10-CM

## 2019-11-05 MED ORDER — ALBUTEROL SULFATE HFA 108 (90 BASE) MCG/ACT IN AERS: 1.0000 | INHALATION_SPRAY | RESPIRATORY_TRACT | 2 refills | Status: DC | PRN

## 2019-11-05 NOTE — Assessment & Plan Note (Signed)
Persistent problem, now some inc fatigue after COVID Clinically some mild episodic respiratory symptoms, usually triggered or provoked Former smoker quit >2 years ago, has done well smoke free Uses PRN albuterol flare Not on maintenance Prior imaging consistent w/ COPD  Plan Return to [facility] now, he requested, last visit 2019, new provider needed Refill Albuterol PRN. No longer on maintenance

## 2019-11-05 NOTE — Patient Instructions (Addendum)
Thank you for coming to the office today.  Triglycerides >400 now  Increase Fish Oil 1000mg  to now take TWO capsules TWICE a day with meal.  Recent Labs    03/27/19 0817 10/29/19 0812  HGBA1C 5.4 5.6   Limit chocolate and carbs/starches  ------------------------------------------------------------------------------------  Can also check with Pharmacy / [facility].  [place]:  [facility] [place] [address]  Hours: Monday - Sunday 8:00am to 12:00pm  COVID-19 Vaccines By Appointment Only  Sign up for [place] List  [email or web address] or text "VACCINE" to [phone] or call [phone]  Stay tuned for apt  [facility] [address] [place], [place] Phone: [phone]   Please schedule a Follow-up Appointment to: Return in about 6 months (around 05/07/2020) for Annual Physical.  If you have any other questions or concerns, please feel free to call the office or send a message through [place]. You may also schedule an earlier appointment if necessary.  Additionally, you may be receiving a survey about your experience at our office within a few days to 1 week by e-mail or mail. We value your feedback.  [name], DO [place]

## 2019-11-05 NOTE — Assessment & Plan Note (Signed)
Elevated TG now >400, seems statin fish oil not having as much effect, poor diet lately Suggestive of familial HLD component Last lipid panel 10/2019 Calculated ASCVD 10 yr risk score down to 13-15% now former smoker  Plan: 1. Continue Rosuvastatin 10mg  daily 2. Continue Fish Oil - INCREASE up to 1000mg  x 2 = 2000mg  TWICE daily 3. Encourage improved lifestyle - low carb/cholesterol emphasized DASH / Mediterranean diet options, reduce portion size, improve regular exercise 4. Follow-up 6 months for repeat chemistry lipid

## 2019-11-05 NOTE — Assessment & Plan Note (Signed)
Elevated A1c up to 5.6, previous 5.4, at risk of PreDM Concern HLD  Plan:  1. Not on any therapy currently  2. Encourage improved lifestyle - low carb, low sugar diet, reduce portion size, continue improving regular exercise - Emphasis on reducing chocolate and beer  Repeat 6 month w/ labs

## 2019-11-05 NOTE — Progress Notes (Signed)
Subjective:    [name], male    DOB: [birth year], 64 y.o.   MRN: [id number]  BRIANT ARIF is a 64 y.o. male presenting on 11/05/2019 for Hyperlipidemia (btw had was covid positive 09/14/2019 as per patient)   HPI   Recent course COVID19 09/2019, had significant fatigue, did not lose taste or smell Lost 20 lbs initially, but has gained back some weight. Now improved clinically.  Elevated A1c A1c up to 5.6 from previous 5.4 on recent lab. He admits overall has improved diet but still eating chocolate regularly and drinking beer regularly. Meds:Never on meds Currentlynot onACEi / ARB Denies hypoglycemia, polyuria.  HYPERLIPIDEMIA/ Hypertriglyceridemia Chronic history of hyperTG and some familial component Last lipid panel up to TG >400 - Taking FIsh Oil once daily Lifestyle - Weight stable now in past 6 months - Drinking more water. Still eating chocolate / drinking beer - Exercise:no change in regular exercise, still walking at work.  Additional complaint  Centrilobular Emphysema Previously seen by [facility] Dr Juanell Fairly in 2019. Now he requests referral to return to them to new doctor there No longer on Anoro Used albuterol PRN. Needs refill - Former smoker quit >2 years ago    Health Maintenance:   Colon CA Screening: Last Colonoscopy (done by [facility] Dr Vicente Males 04/2019, asked to return in 8 months to repeat due to precancerous polyp    Depression screen [facility] 2/9 11/05/2019 05/08/2019 04/03/2019  Decreased Interest 0 0 0  Down, Depressed, Hopeless 0 0 0  PHQ - 2 Score 0 0 0    Social History   Tobacco Use  . Smoking status: Former Smoker    Packs/day: 0.25    Years: 40.00    Pack years: 10.00    Types: Cigarettes    Quit date: 09/14/2017    Years since quitting: 2.1  . Smokeless tobacco: Former Network engineer Use Topics  . Alcohol use: Yes    Comment: 6 pack on weekend  . Drug use: No    Review of Systems Per HPI unless specifically  indicated above     Objective:    BP 128/67   Pulse 83   Temp (!) 97.3 F (36.3 C) (Oral)   Resp 16   Ht 6' (1.829 m)   Wt 185 lb (83.9 kg)   BMI 25.09 kg/m   Wt Readings from Last 3 Encounters:  11/05/19 185 lb (83.9 kg)  05/08/19 186 lb 9.6 oz (84.6 kg)  04/26/19 182 lb (82.6 kg)    Physical Exam Vitals and nursing note reviewed.  Constitutional:      General: He is not in acute distress.    Appearance: He is well-developed. He is not diaphoretic.     Comments: Well-appearing, comfortable, cooperative  HENT:     Head: Normocephalic and atraumatic.  Eyes:     General:        Right eye: No discharge.        Left eye: No discharge.     Conjunctiva/sclera: Conjunctivae normal.  Cardiovascular:     Rate and Rhythm: Normal rate.  Pulmonary:     Effort: Pulmonary effort is normal.  Skin:    General: Skin is warm and dry.     Findings: No erythema or rash.  Neurological:     Mental Status: He is alert and oriented to person, place, and time.  Psychiatric:        Behavior: Behavior normal.     Comments:  Well groomed, good eye contact, normal speech and thoughts    Results for orders placed or performed in visit on 10/29/19  Lipid panel  Result Value Ref Range   Cholesterol 231 (H) <200 mg/dL   HDL 38 (L) > OR = 40 mg/dL   Triglycerides 415 (H) <150 mg/dL   LDL Cholesterol (Calc)  mg/dL (calc)   Total CHOL/HDL Ratio 6.1 (H) <5.0 (calc)   Non-HDL Cholesterol (Calc) 193 (H) <130 mg/dL (calc)  COMPLETE METABOLIC PANEL WITH GFR  Result Value Ref Range   Glucose, Bld 142 (H) 65 - 99 mg/dL   BUN 13 7 - 25 mg/dL   Creat 0.99 0.70 - 1.25 mg/dL   GFR, Est Non African American 80 > OR = 60 mL/min/1.58m2   GFR, Est African American 92 > OR = 60 mL/min/1.66m2   BUN/Creatinine Ratio NOT APPLICABLE 6 - 22 (calc)   Sodium 139 135 - 146 mmol/L   Potassium 4.4 3.5 - 5.3 mmol/L   Chloride 105 98 - 110 mmol/L   CO2 26 20 - 32 mmol/L   Calcium 9.3 8.6 - 10.3 mg/dL   Total  Protein 6.4 6.1 - 8.1 g/dL   Albumin 3.9 3.6 - 5.1 g/dL   Globulin 2.5 1.9 - 3.7 g/dL (calc)   AG Ratio 1.6 1.0 - 2.5 (calc)   Total Bilirubin 1.0 0.2 - 1.2 mg/dL   Alkaline phosphatase (APISO) 73 35 - 144 U/L   AST 16 10 - 35 U/L   ALT 12 9 - 46 U/L  Hemoglobin A1c  Result Value Ref Range   Hgb A1c MFr Bld 5.6 <5.7 % of total Hgb   Mean Plasma Glucose 114 (calc)   eAG (mmol/L) 6.3 (calc)      Assessment & Plan:   Problem List Items Addressed This Visit    Hyperlipidemia - Primary    Elevated TG now >400, seems statin fish oil not having as much effect, poor diet lately Suggestive of familial HLD component Last lipid panel 10/2019 Calculated ASCVD 10 yr risk score down to 13-15% now former smoker  Plan: 1. Continue Rosuvastatin 10mg  daily 2. Continue Fish Oil - INCREASE up to 1000mg  x 2 = 2000mg  TWICE daily 3. Encourage improved lifestyle - low carb/cholesterol emphasized DASH / Mediterranean diet options, reduce portion size, improve regular exercise 4. Follow-up 6 months for repeat chemistry lipid      Former smoker   Relevant Orders   Ambulatory referral to Pulmonology   Elevated hemoglobin A1c    Elevated A1c up to 5.6, previous 5.4, at risk of PreDM Concern HLD  Plan:  1. Not on any therapy currently  2. Encourage improved lifestyle - low carb, low sugar diet, reduce portion size, continue improving regular exercise - Emphasis on reducing chocolate and beer  Repeat 6 month w/ labs      Centrilobular emphysema (HCC)    Persistent problem, now some inc fatigue after COVID Clinically some mild episodic respiratory symptoms, usually triggered or provoked Former smoker quit >2 years ago, has done well smoke free Uses PRN albuterol flare Not on maintenance Prior imaging consistent w/ COPD  Plan Return to [facility] now, he requested, last visit 2019, new provider needed Refill Albuterol PRN. No longer on maintenance      Relevant Medications   albuterol  (VENTOLIN HFA) 108 (90 Base) MCG/ACT inhaler   Other Relevant Orders   Ambulatory referral to Pulmonology    Other Visit Diagnoses    History of  COVID-19       Relevant Orders   Ambulatory referral to Pulmonology      Orders Placed This Encounter  Procedures  . Ambulatory referral to Pulmonology    Referral Priority:   Routine    Referral Type:   Consultation    Referral Reason:   Specialty Services Required    Requested Specialty:   Pulmonary Disease    Number of Visits Requested:   1     Meds ordered this encounter  Medications  . albuterol (VENTOLIN HFA) 108 (90 Base) MCG/ACT inhaler    Sig: Inhale 1-2 puffs into the lungs every 4 (four) hours as needed for wheezing or shortness of breath.    Dispense:  18 g    Refill:  2      Follow up plan: Return in about 6 months (around 05/07/2020) for Annual Physical.  Future labs ordered for 04/30/20   [name], [facility] 11/05/2019, 8:17 AM

## 2019-12-04 ENCOUNTER — Ambulatory Visit
Admission: RE | Admit: 2019-12-04 | Discharge: 2019-12-04 | Disposition: A | Discharge status: Home / Self Care | Payer: Medicare HMO | Source: Ambulatory Visit | Attending: Pulmonary Disease | Admitting: Pulmonary Disease

## 2019-12-04 ENCOUNTER — Encounter: Payer: Self-pay | Admitting: Pulmonary Disease

## 2019-12-04 ENCOUNTER — Ambulatory Visit
Admission: RE | Admit: 2019-12-04 | Discharge: 2019-12-04 | Disposition: A | Discharge status: Home / Self Care | Payer: Medicare HMO | Attending: Pulmonary Disease | Admitting: Pulmonary Disease

## 2019-12-04 ENCOUNTER — Other Ambulatory Visit: Payer: Self-pay

## 2019-12-04 ENCOUNTER — Ambulatory Visit: Payer: Medicare HMO | Admitting: Pulmonary Disease

## 2019-12-04 VITALS — BP 138/80 | HR 84 | Temp 97.3°F | Ht 72.0 in | Wt 185.5 lb

## 2019-12-04 DIAGNOSIS — J439 Emphysema, unspecified: Secondary | ICD-10-CM | POA: Insufficient documentation

## 2019-12-04 DIAGNOSIS — Z8616 Personal history of COVID-19: Secondary | ICD-10-CM | POA: Insufficient documentation

## 2019-12-04 DIAGNOSIS — Z87891 Personal history of nicotine dependence: Secondary | ICD-10-CM | POA: Diagnosis not present

## 2019-12-04 DIAGNOSIS — J432 Centrilobular emphysema: Secondary | ICD-10-CM

## 2019-12-04 MED ORDER — ANORO ELLIPTA 62.5-25 MCG/INH IN AEPB: 1.0000 | INHALATION_SPRAY | Freq: Every day | RESPIRATORY_TRACT | 0 refills | Status: DC

## 2019-12-04 MED ORDER — TRELEGY ELLIPTA 100-62.5-25 MCG/INH IN AEPB: 1.0000 | INHALATION_SPRAY | Freq: Every day | RESPIRATORY_TRACT | 0 refills | Status: DC

## 2019-12-04 NOTE — Progress Notes (Signed)
@Patient  ID: Ethan Fitzgerald, male    DOB: 05/22/1956, 64 y.o.   MRN: [id number]  Chief Complaint  Patient presents with  . Follow-up    Referring provider: [name] *  HPI:  64 year old male former smoker followed in our office for suspected COPD, status post SARS-CoV-2 in January/2021  PMH: Seasonal allergies, hyperlipidemia, back pain Smoker/ Smoking History: Former smoker.  Quit 2019.  72.5-pack-year smoking history Maintenance:  Trelegy Ellipta ? Pt of: DR  12/04/2019  - Visit   64 year old male former smoker followed in our office for suspected COPD.  Patient presented back to our office after last being seen in 2019.  He refused reporting today that in January/2021 he contracted SARS-CoV-2.  He still has a lingering cough from this.  He did not require hospitalization or mechanical ventilation.  Patient also did not receive monoclonal antibodies.  He reports that his last office was in 2019 he was given a sample of Trelegy Ellipta he felt that this helped.  The sample ran out due to patient's insurance not covering it and patient has not been maintained on it since.  Patient has not had any recent episodes of bronchitis or issues breathing needing steroids or antibiotics.  Patient simply presented to our office due to primary care recommending to follow-up given the fact that he was recently diagnosed with Covid.  Questionaires / Pulmonary Flowsheets:   MMRC: mMRC Dyspnea Scale mMRC Score  12/04/2019 1    Tests:   CBC 03/15/17; abs eos=616.  FENO:  No results found for: NITRICOXIDE  PFT: No flowsheet data found.  WALK:  No flowsheet data found.  Imaging: No results found.  Lab Results:  CBC    Component Value Date/Time   WBC 7.2 03/27/2019 0817   RBC 5.29 03/27/2019 0817   HGB 17.3 (H) 03/27/2019 0817   HCT 51.6 (H) 03/27/2019 0817   PLT 165 03/27/2019 0817   MCV 97.5 03/27/2019 0817   MCH 32.7 03/27/2019 0817   MCHC 33.5 03/27/2019 0817   RDW 13.1 03/27/2019 0817   LYMPHSABS 2,311 03/27/2019 0817   MONOABS 704 03/15/2017 0839   EOSABS 511 (H) 03/27/2019 0817   BASOSABS 58 03/27/2019 0817    BMET    Component Value Date/Time   NA 139 10/29/2019 0812   K 4.4 10/29/2019 0812   CL 105 10/29/2019 0812   CO2 26 10/29/2019 0812   GLUCOSE 142 (H) 10/29/2019 0812   BUN 13 10/29/2019 0812   CREATININE 0.99 10/29/2019 0812   CALCIUM 9.3 10/29/2019 0812   GFRNONAA 80 10/29/2019 0812   GFRAA 92 10/29/2019 0812    BNP No results found for: BNP  ProBNP No results found for: PROBNP  Specialty Problems      Pulmonary Problems   Centrilobular emphysema ([place])      No Known Allergies  Immunization History  Administered Date(s) Administered  . Influenza,inj,Quad PF,6+ Mos 06/22/2018, 06/30/2019  . Pneumococcal Conjugate-13 04/03/2019  . Tdap 03/18/2017   Recommend covid exam   Past Medical History:  Diagnosis Date  . COPD (chronic obstructive pulmonary disease) (HCC)    mild  . Erectile dysfunction   . Headache    daily, stress headaches  . Knee pain, left   . Seasonal allergies   . Tobacco dependence     Tobacco History: Social History   Tobacco Use  Smoking Status Former Smoker  . Packs/day: 1.50  . Years: 49.00  . Pack years: 73.50  . Types: Cigarettes  .  Start date: 59  . Quit date: 09/14/2017  . 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 albuterol (VENTOLIN HFA) 108 (90 Base) MCG/ACT inhaler Inhale 1-2 puffs into the lungs every 4 (four) hours as needed for wheezing or shortness of breath.  . Aspirin-Acetaminophen-Caffeine (EXCEDRIN PO) Take 2 tablets by mouth. am  . cyclobenzaprine (FLEXERIL) 10 MG tablet Take 1 tablet (10 mg total) by mouth 3 (three) times daily as needed for muscle spasms.  . naproxen (NAPROSYN) 500 MG tablet TAKE 1 TABLET (500 MG TOTAL) BY  MOUTH 2 (TWO) TIMES DAILY WITH A MEAL. FOR 2-4 WEEKS THEN AS NEEDED  . Omega-3 Fatty Acids (FISH OIL) 1000 MG CAPS Take 2,000 mg by mouth in the morning and at bedtime.  . rosuvastatin (CRESTOR) 10 MG tablet TAKE 1 TABLET BY MOUTH EVERYDAY AT BEDTIME  . sildenafil (REVATIO) 20 MG tablet TAKE 1 TO 5 TABLETS BY MOUTH ABOUT 30 MINUTES PRIOR TO SEX.START WITH 1 THEN INCREASE  . vitamin C (ASCORBIC ACID) 500 MG tablet Take 500 mg by mouth daily. Am  . umeclidinium-vilanterol (ANORO ELLIPTA) 62.5-25 MCG/INH AEPB Inhale 1 puff into the lungs daily.  . [DISCONTINUED] Fluticasone-Umeclidin-Vilant (TRELEGY ELLIPTA) 100-62.5-25 MCG/INH AEPB Inhale 1 puff into the lungs daily.   No facility-administered encounter medications on file as of 12/04/2019.     Review of Systems  Review of Systems  Constitutional: Negative for activity change, chills, fatigue, fever and unexpected weight change.  HENT: Negative for postnasal drip, rhinorrhea, sinus pressure, sinus pain and sore throat.   Eyes: Negative.   Respiratory: Negative for cough, shortness of breath and wheezing.   Cardiovascular: Negative for chest pain and palpitations.  Gastrointestinal: Negative for constipation, diarrhea, nausea and vomiting.  Endocrine: Negative.   Genitourinary: Negative.   Musculoskeletal: Negative.   Skin: Negative.   Neurological: Negative for dizziness and headaches.  Psychiatric/Behavioral: Negative.  Negative for dysphoric mood. The patient is not nervous/anxious.   All other systems reviewed and are negative.    Physical Exam  BP 138/80 (BP Location: Left Arm, Cuff Size: Normal)   Pulse 84   Temp (!) 97.3 F (36.3 C) (Temporal)   Ht 6' (1.829 m)   Wt 185 lb 8 oz (84.1 kg)   SpO2 98%   BMI 25.16 kg/m   Wt Readings from Last 5 Encounters:  12/04/19 185 lb 8 oz (84.1 kg)  11/05/19 185 lb (83.9 kg)  05/08/19 186 lb 9.6 oz (84.6 kg)  04/26/19 182 lb (82.6 kg)  04/03/19 184 lb (83.5 kg)    BMI Readings  from Last 5 Encounters:  12/04/19 25.16 kg/m  11/05/19 25.09 kg/m  05/08/19 25.31 kg/m  04/26/19 24.68 kg/m  04/03/19 24.95 kg/m     Physical Exam Vitals and nursing note reviewed.  Constitutional:      General: He is not in acute distress.    Appearance: Normal appearance. He is obese.  HENT:     Head: Normocephalic and atraumatic.     Right Ear: Hearing and external ear normal.     Left Ear: Hearing and external ear normal.     Nose: Nose normal. No mucosal edema or rhinorrhea.     Right Turbinates: Not enlarged.     Left Turbinates: Not enlarged.     Mouth/Throat:     Mouth: Mucous membranes are dry.  Pharynx: Oropharynx is clear. No oropharyngeal exudate.  Eyes:     Pupils: Pupils are equal, round, and reactive to light.  Cardiovascular:     Rate and Rhythm: Normal rate and regular rhythm.     Pulses: Normal pulses.     Heart sounds: Normal heart sounds. No murmur.  Pulmonary:     Effort: Pulmonary effort is normal.     Breath sounds: Normal breath sounds. No decreased breath sounds, wheezing or rales.  Musculoskeletal:     Cervical back: Normal range of motion.  Lymphadenopathy:     Cervical: No cervical adenopathy.  Skin:    General: Skin is warm and dry.     Capillary Refill: Capillary refill takes less than 2 seconds.     Findings: No erythema or rash.  Neurological:     General: No focal deficit present.     Mental Status: He is alert and oriented to person, place, and time.     Motor: No weakness.     Coordination: Coordination normal.     Gait: Gait is intact. Gait (tolerated walk in office ) normal.  Psychiatric:        Mood and Affect: Mood normal.        Behavior: Behavior normal. Behavior is cooperative.        Thought Content: Thought content normal.        Judgment: Judgment normal.       Assessment & Plan:   Centrilobular emphysema ([place]) 72 pack year history  2019 elevated eos  Walk in office with no o2 desats  Plan:  PFTs  orders  Trial of Anoro, do not believe we need ICS right now  [facility] with [facility] of medications 6 month follow up in office   History of COVID-19 Plan:  Chest xray today  Walk in office today  Recommend getting covid19 vaccine  Former smoker Plan:  Chest xray  Referral to lung cancer screening clinic in [facility]     Return in about 6 months (around 06/05/2020), or if symptoms worsen or fail to improve, for [facility] - Dr. Patsey Berthold, [facility] - Dr. Mortimer Fries.   Lauraine Rinne, NP 12/04/2019   This appointment required 32 minutes of patient care (this includes precharting, chart review, review of results, face-to-face care, etc.).

## 2019-12-04 NOTE — Assessment & Plan Note (Addendum)
72 pack year history  2019 elevated eos  Walk in office with no o2 desats  Plan:  PFTs orders  Trial of Anoro, do not believe we need ICS right now  [facility] help with cost of medications 6 month follow up in office

## 2019-12-04 NOTE — Assessment & Plan Note (Signed)
Plan:  Chest xray  Referral to lung cancer screening clinic in [facility]

## 2019-12-04 NOTE — Patient Instructions (Addendum)
You were seen today by Lauraine Rinne, NP  for:   1. History of COVID-19  - DG Chest 2 View; Future  Walk today in office, no desats  2. Centrilobular emphysema (HCC)  Anoro Ellipta  >>> Take 1 puff daily in the morning right when you wake up >>>Rinse your mouth out after use >>>This is a daily maintenance inhaler, NOT a rescue inhaler >>>Contact our office if you are having difficulties affording or obtaining this medication >>>It is important for you to be able to take this daily and not miss any doses   [facility]   We will order pulmonary function testing to further evaluate your breathing  3. Former smoker  Continue to not smoke  We will refer you to the [facility] lung cancer screening program  COVID-19 Vaccine Information can be found at: ShippingScam.co.uk For questions related to vaccine distribution or appointments, please email vaccine@[email or web address] [email or web address] or call [phone].      We recommend today:  Orders Placed This Encounter  Procedures  . DG Chest 2 View    Standing Status:   Future    Standing Expiration Date:   02/02/2021    Order Specific Question:   Reason for Exam (SYMPTOM  OR DIAGNOSIS REQUIRED)    Answer:   former smoker, suspected copd, jan/21 covid19 infection    Order Specific Question:   Preferred imaging location?    Answer:   [facility]    Order Specific Question:   Radiology Contrast Protocol - do NOT remove file path    Answer:   [email or web address]  . AMB Referral to [facility]    Referral Priority:   Routine    Referral Type:   Consultation    Referral Reason:   [facility] Management    Number of Visits Requested:   1  . Pulmonary function test    Standing Status:   Future    Standing Expiration Date:   12/03/2020    Order Specific Question:   Where should this test be performed?    Answer:    [facility]    Order Specific Question:   Full PFT: includes the following: basic spirometry, spirometry pre & post bronchodilator, diffusion capacity (DLCO), lung volumes    Answer:   Full PFT   Orders Placed This Encounter  Procedures  . DG Chest 2 View  . AMB Referral to [place] Management  . Pulmonary function test   Follow Up:    Return in about 6 months (around 06/05/2020), or if symptoms worsen or fail to improve, for [facility] - Dr. Patsey Berthold, [facility] - Dr. Mortimer Fries.   Please do your part to reduce the spread of COVID-19:      Reduce your risk of any infection  and COVID19 by using the similar precautions used for avoiding the common cold or flu:  Marland Kitchen Wash your hands often with soap and warm water for at least 20 seconds.  If soap and water are not readily available, use an alcohol-based hand sanitizer with at least 60% alcohol.  . If coughing or sneezing, cover your mouth and nose by coughing or sneezing into the elbow areas of your shirt or coat, into a tissue or into your sleeve (not your hands). Langley Gauss A MASK when in public  . Avoid shaking hands with others and consider head nods or verbal greetings only. . Avoid touching your eyes, nose, or mouth with unwashed hands.  . Avoid close  contact with people who are sick. . Avoid places or events with large numbers of people in one location, like concerts or sporting events. . If you have some symptoms but not all symptoms, continue to monitor at home and seek medical attention if your symptoms worsen. . If you are having a medical emergency, call 911.   [place] / e-Visit: [email or web address]         [facility]: [facility]: [id number]                   [facility]: [id number]     It is flu season:   >>> Best ways to protect herself from the  flu: Receive the yearly flu vaccine, practice good hand hygiene washing with soap and also using hand sanitizer when available, eat a nutritious meals, get adequate rest, hydrate appropriately   Please contact the office if your symptoms worsen or you have concerns that you are not improving.   Thank you for choosing [facility] for your healthcare, and for allowing [place] to partner with you on your healthcare journey. I am thankful to be able to provide care to you today.   Wyn Quaker FNP-C

## 2019-12-04 NOTE — Assessment & Plan Note (Addendum)
Plan:  Chest xray today  Walk in office today  Recommend getting covid19 vaccine

## 2019-12-06 ENCOUNTER — Telehealth: Payer: Self-pay | Admitting: *Deleted

## 2019-12-06 NOTE — Telephone Encounter (Signed)
Received referral for low dose lung cancer screening CT scan. Message left at phone number listed in EMR for patient to call me back to facilitate scheduling scan.  

## 2019-12-10 ENCOUNTER — Telehealth: Payer: Self-pay | Admitting: Family Medicine

## 2019-12-10 NOTE — Chronic Care Management (AMB) (Signed)
  Chronic Care Management   Note  12/10/2019 Name: Ethan Fitzgerald MRN: [id number] DOB: 1956/03/13  Ethan Fitzgerald is a 64 y.o. year old male who is a primary care patient of Olin Hauser, DO. I reached out to Janene Harvey by phone today in response to a referral sent by [name]'s PCP, Dr. Parks Ranger     [name] was given information about Chronic Care Management services today including:  1. CCM service includes personalized support from designated clinical staff supervised by his physician, including individualized plan of care and coordination with other care providers 2. 24/7 contact phone numbers for assistance for urgent and routine care needs. 3. Service will only be billed when office clinical staff spend 20 minutes or more in a month to coordinate care. 4. Only one practitioner may furnish and bill the service in a calendar month. 5. The patient may stop CCM services at any time (effective at the end of the month) by phone call to the office staff. 6. The patient will be responsible for cost sharing (co-pay) of up to 20% of the service fee (after annual deductible is met).  Patient agreed to services and verbal consent obtained.   Follow up plan: Telephone appointment with care management team member scheduled for:12/26/2019  Noreene Larsson, [place], [place], [place] [address] Direct Dial: [phone] [email or web address] Website: [email or web address]

## 2019-12-10 NOTE — Chronic Care Management (AMB) (Signed)
  Chronic Care Management   Outreach Note  12/10/2019 Name: Ethan Fitzgerald MRN: [id number] DOB: Jan 19, 1956  Ethan Fitzgerald is a 64 y.o. year old male who is a primary care patient of Olin Hauser, DO. I reached out to Ethan Fitzgerald by phone today in response to a referral sent by Ethan Fitzgerald's PCP, Dr. Parks Ranger     An unsuccessful telephone outreach was attempted today. The patient was referred to the case management team Pharmacist for assistance with medication assistance.   Follow Up Plan: A HIPPA compliant phone message was left for the patient providing contact information and requesting a return call.  The care management team will reach out to the patient again over the next 7 days.  If patient returns call to provider office, please advise to call [facility]  [facility] [address] , [place], [place], [place] [address] Direct Dial: [phone] [email or web address] Website: [email or web address]

## 2019-12-13 ENCOUNTER — Ambulatory Visit: Payer: Self-pay | Admitting: General Practice

## 2019-12-13 NOTE — Chronic Care Management (AMB) (Signed)
°  Chronic Care Management   Outreach Note  12/13/2019 Name: Ethan Fitzgerald MRN: [id number] DOB: 1956/03/24  Referred by: Olin Hauser, DO Reason for referral : Chronic Care Management (MD referral for help with medication cost inhaler and chronic condtions)   An unsuccessful telephone outreach was attempted today. The patient was referred to the case management team for assistance with care management and care coordination. The patient was at work and stated he was dealing with an emergency and could not talk to call back after 5 or between 9 and 10 am.   Follow Up Plan: The care management team will reach out to the patient again over the next 14 days.   Noreene Larsson RN, MSN, [place] Mobile: [phone]

## 2019-12-14 ENCOUNTER — Telehealth: Payer: Self-pay | Admitting: *Deleted

## 2019-12-14 DIAGNOSIS — Z87891 Personal history of nicotine dependence: Secondary | ICD-10-CM

## 2019-12-14 NOTE — Telephone Encounter (Signed)
Received referral for initial lung cancer screening scan. Contacted patient and obtained smoking history,(former, quit 09/14/17, 73.5 pack year) as well as answering questions related to screening process. Patient denies signs of lung cancer such as weight loss or hemoptysis. Patient denies comorbidity that would prevent curative treatment if lung cancer were found. Patient is scheduled for shared decision making visit and CT scan on 12/19/19 at 1045am.

## 2019-12-19 ENCOUNTER — Other Ambulatory Visit: Payer: Self-pay

## 2019-12-19 ENCOUNTER — Ambulatory Visit
Admission: RE | Admit: 2019-12-19 | Discharge: 2019-12-19 | Disposition: A | Discharge status: Home / Self Care | Payer: Medicare HMO | Source: Ambulatory Visit | Attending: Oncology | Admitting: Oncology

## 2019-12-19 ENCOUNTER — Inpatient Hospital Stay: Payer: Medicare HMO | Attending: Oncology | Admitting: Oncology

## 2019-12-19 ENCOUNTER — Other Ambulatory Visit
Admission: RE | Admit: 2019-12-19 | Discharge: 2019-12-19 | Disposition: A | Discharge status: Home / Self Care | Payer: Medicare HMO | Source: Ambulatory Visit | Attending: Pulmonary Disease | Admitting: Pulmonary Disease

## 2019-12-19 DIAGNOSIS — K8020 Calculus of gallbladder without cholecystitis without obstruction: Secondary | ICD-10-CM | POA: Diagnosis not present

## 2019-12-19 DIAGNOSIS — Z87891 Personal history of nicotine dependence: Secondary | ICD-10-CM

## 2019-12-19 DIAGNOSIS — I700 Atherosclerosis of aorta: Secondary | ICD-10-CM | POA: Insufficient documentation

## 2019-12-19 DIAGNOSIS — Z01812 Encounter for preprocedural laboratory examination: Secondary | ICD-10-CM | POA: Insufficient documentation

## 2019-12-19 DIAGNOSIS — J439 Emphysema, unspecified: Secondary | ICD-10-CM | POA: Diagnosis not present

## 2019-12-19 DIAGNOSIS — Z20822 Contact with and (suspected) exposure to covid-19: Secondary | ICD-10-CM | POA: Insufficient documentation

## 2019-12-19 LAB — SARS CORONAVIRUS 2 (TAT 6-24 HRS): SARS Coronavirus 2: NEGATIVE

## 2019-12-19 NOTE — Progress Notes (Signed)
Virtual Visit via Video Note  I connected with [name] on 12/19/19 at 10:45 AM EDT by a video enabled telemedicine application and verified that I am speaking with the correct person using two identifiers.  Location: Patient: [facility] Provider: Office   I discussed the limitations of evaluation and management by telemedicine and the availability of in person appointments. The patient expressed understanding and agreed to proceed.  I discussed the assessment and treatment plan with the patient. The patient was provided an opportunity to ask questions and all were answered. The patient agreed with the plan and demonstrated an understanding of the instructions.   The patient was advised to call back or seek an in-person evaluation if the symptoms worsen or if the condition fails to improve as anticipated.   In accordance with CMS guidelines, patient has met eligibility criteria including age, absence of signs or symptoms of lung cancer.  Social History   Tobacco Use  . Smoking status: Former Smoker    Packs/day: 1.50    Years: 49.00    Pack years: 73.50    Types: Cigarettes    Start date: 1970    Quit date: 09/14/2017    Years since quitting: 2.2  . Smokeless tobacco: Former Network engineer Use Topics  . Alcohol use: Yes    Comment: 6 pack on weekend  . Drug use: No      A shared decision-making session was conducted prior to the performance of CT scan. This includes one or more decision aids, includes benefits and harms of screening, follow-up diagnostic testing, over-diagnosis, false positive rate, and total radiation exposure.   Counseling on the importance of adherence to annual lung cancer LDCT screening, impact of co-morbidities, and ability or willingness to undergo diagnosis and treatment is imperative for compliance of the program.   Counseling on the importance of continued smoking cessation for former smokers; the importance of smoking cessation for current smokers, and  information about tobacco cessation interventions have been given to patient including [place] and [address] quit [place] programs.   Written order for lung cancer screening with LDCT has been given to the patient and any and all questions have been answered to the best of my abilities.    Yearly follow up will be coordinated by Burgess Estelle, Thoracic Navigator.  I provided 15 minutes of face-to-face video visit time during this encounter, and > 50% was spent counseling as documented under my assessment & plan.   Jacquelin Hawking, NP

## 2019-12-19 NOTE — Progress Notes (Signed)
SARS-CoV-2 test is negative.  This is good news.  Proceed forward with work-up as planned.  Johm Pfannenstiel, FNP 

## 2019-12-20 ENCOUNTER — Telehealth: Payer: Self-pay | Admitting: *Deleted

## 2019-12-20 NOTE — Telephone Encounter (Signed)
Thank you   Ethan Fitzgerald  

## 2019-12-20 NOTE — Telephone Encounter (Signed)
Notified patient of LDCT lung cancer screening program results with recommendation for 12 month follow up imaging. Also notified of incidental findings noted below and is encouraged to discuss further with PCP who will receive a copy of this note and/or the CT report. Patient verbalizes understanding.   IMPRESSION: 1. Lung-RADS 1, negative. Continue annual screening with low-dose chest CT without contrast in 12 months. 2. Cholelithiasis. 3.  Aortic atherosclerosis (ICD10-I70.0). 4.  Emphysema (ICD10-J43.9).

## 2019-12-21 ENCOUNTER — Ambulatory Visit (INDEPENDENT_AMBULATORY_CARE_PROVIDER_SITE_OTHER): Payer: Medicare HMO | Admitting: Internal Medicine

## 2019-12-21 ENCOUNTER — Encounter: Payer: Self-pay | Admitting: *Deleted

## 2019-12-21 ENCOUNTER — Other Ambulatory Visit: Payer: Self-pay

## 2019-12-21 DIAGNOSIS — Z8616 Personal history of COVID-19: Secondary | ICD-10-CM

## 2019-12-21 DIAGNOSIS — J432 Centrilobular emphysema: Secondary | ICD-10-CM | POA: Diagnosis not present

## 2019-12-21 LAB — PULMONARY FUNCTION TEST
DL/VA % pred: 74 %
DL/VA: 3.05 ml/min/mmHg/L
DLCO cor % pred: 84 %
DLCO cor: 24.81 ml/min/mmHg
DLCO unc % pred: 84 %
DLCO unc: 24.81 ml/min/mmHg
FEF 25-75 Post: 1.51 L/sec
FEF 25-75 Pre: 1.43 L/sec
FEF2575-%Change-Post: 5 %
FEF2575-%Pred-Post: 50 %
FEF2575-%Pred-Pre: 47 %
FEV1-%Change-Post: 1 %
FEV1-%Pred-Post: 77 %
FEV1-%Pred-Pre: 76 %
FEV1-Post: 2.97 L
FEV1-Pre: 2.92 L
FEV1FVC-%Change-Post: -2 %
FEV1FVC-%Pred-Pre: 78 %
FEV6-%Change-Post: 4 %
FEV6-%Pred-Post: 103 %
FEV6-%Pred-Pre: 99 %
FEV6-Post: 5.05 L
FEV6-Pre: 4.84 L
FEV6FVC-%Change-Post: 0 %
FEV6FVC-%Pred-Post: 102 %
FEV6FVC-%Pred-Pre: 102 %
FVC-%Change-Post: 4 %
FVC-%Pred-Post: 101 %
FVC-%Pred-Pre: 96 %
FVC-Post: 5.19 L
FVC-Pre: 4.96 L
Post FEV1/FVC ratio: 57 %
Post FEV6/FVC ratio: 97 %
Pre FEV1/FVC ratio: 59 %
Pre FEV6/FVC Ratio: 98 %

## 2019-12-21 NOTE — Progress Notes (Signed)
Full PFT performed today. °

## 2019-12-22 DIAGNOSIS — H524 Presbyopia: Secondary | ICD-10-CM | POA: Diagnosis not present

## 2019-12-22 DIAGNOSIS — Z0100 Encounter for examination of eyes and vision without abnormal findings: Secondary | ICD-10-CM | POA: Diagnosis not present

## 2019-12-22 DIAGNOSIS — H3562 Retinal hemorrhage, left eye: Secondary | ICD-10-CM | POA: Diagnosis not present

## 2019-12-26 ENCOUNTER — Ambulatory Visit (INDEPENDENT_AMBULATORY_CARE_PROVIDER_SITE_OTHER): Payer: Medicare HMO | Admitting: Pharmacist

## 2019-12-26 DIAGNOSIS — J432 Centrilobular emphysema: Secondary | ICD-10-CM | POA: Diagnosis not present

## 2019-12-26 NOTE — Patient Instructions (Signed)
Thank you allowing the Chronic Care Management Team to be a part of your care! It was a pleasure speaking with you today!     CCM (Chronic Care Management) Team    Noreene Larsson RN, MSN, CCM Nurse Care Coordinator  [phone]   Harlow Asa PharmD  Clinical Pharmacist  [phone]   Eula Fried LCSW Clinical Social Worker [phone]  Visit Information  Goals Addressed            This Visit's Progress   . PharmD - "I can't afford my inhaler" (pt-stated)       CARE PLAN ENTRY (see longtitudinal plan of care for additional care plan information)  Current Barriers:  . Financial Barriers in complicated patient with multiple medical conditions including COPD and HLD; patient has [employer] Medicare Part D insurance and reports copay for Anoro inhaler is cost prohibitive at this time  Pharmacist Clinical Goal(s):  Marland Kitchen Over the next 30 days, patient will work with PharmD and providers to relieve medication access concerns  Interventions: . Review online formulary for patient's Medicare plan and review Medicare Part D coverage with patient o Note Anoro inhaler not covered on plan formulary o Patient confirms currently using plan preferred pharmacy, [facility] . Review requirements for [employer] patient assistance program for Anoro. Patient denies currently meeting requirements for assistance program. . Identify Stiolto Respimat as therapeutic alternative covered under health plan formulary o Note Stiolto is a tier 3 option o Plan deductible does not apply for tier 1-3 medications . Will collaborate with patient's Pulmonologist to recommend therapeutic switch from Anoro to [place] for cost savings to patient . Inter-disciplinary care team collaboration (see longitudinal plan of care) . Schedule appointment to complete medication review with patient  Patient Self Care Activities:  . Patient to attend scheduled medical appointments o Next PCP visit scheduled for  4/22  Initial goal documentation        Patient verbalizes understanding of instructions provided today.   Telephone follow up appointment with care management team member scheduled for: 5/5 at 3 pm  Harlow Asa, PharmD, [place] [phone]

## 2019-12-26 NOTE — Chronic Care Management (AMB) (Signed)
Chronic Care Management   Follow Up Note   12/26/2019 Name: Ethan Fitzgerald MRN: [id number] DOB: 01-21-1956  Referred by: Ethan Hauser, DO Reason for referral : Chronic Care Management (Initial Patient Outreach Call)   Ethan Fitzgerald is a 64 y.o. year old male who is a primary care patient of Ethan Hauser, DO. The CCM team was consulted for assistance with chronic disease management and care coordination needs, particularly for medication assistance.    I reached out to Ethan Fitzgerald by phone today.   Review of patient status, including review of consultants reports, relevant laboratory and other test results, and collaboration with appropriate care team members and the patient's provider was performed as part of comprehensive patient evaluation and provision of chronic care management services.     Outpatient Encounter Medications as of 12/26/2019  Medication Sig  . albuterol (VENTOLIN HFA) 108 (90 Base) MCG/ACT inhaler Inhale 1-2 puffs into the lungs every 4 (four) hours as needed for wheezing or shortness of breath.  . Aspirin-Acetaminophen-Caffeine (EXCEDRIN PO) Take 2 tablets by mouth. am  . cyclobenzaprine (FLEXERIL) 10 MG tablet Take 1 tablet (10 mg total) by mouth 3 (three) times daily as needed for muscle spasms.  . naproxen (NAPROSYN) 500 MG tablet TAKE 1 TABLET (500 MG TOTAL) BY MOUTH 2 (TWO) TIMES DAILY WITH A MEAL. FOR 2-4 WEEKS THEN AS NEEDED  . Omega-3 Fatty Acids (FISH OIL) 1000 MG CAPS Take 2,000 mg by mouth in the morning and at bedtime.  . rosuvastatin (CRESTOR) 10 MG tablet TAKE 1 TABLET BY MOUTH EVERYDAY AT BEDTIME  . sildenafil (REVATIO) 20 MG tablet TAKE 1 TO 5 TABLETS BY MOUTH ABOUT 30 MINUTES PRIOR TO SEX.START WITH 1 THEN INCREASE  . umeclidinium-vilanterol (ANORO ELLIPTA) 62.5-25 MCG/INH AEPB Inhale 1 puff into the lungs daily.  . vitamin C (ASCORBIC ACID) 500 MG tablet Take 500 mg by mouth daily. Am   No facility-administered encounter  medications on file as of 12/26/2019.    Goals Addressed            This Visit's Progress   . PharmD - "I can't afford my inhaler" (pt-stated)       CARE PLAN ENTRY (see longtitudinal plan of care for additional care plan information)  Current Barriers:  . Financial Barriers in complicated patient with multiple medical conditions including COPD and HLD; patient has [employer] Medicare Part D insurance and reports copay for Anoro inhaler is cost prohibitive at this time  Pharmacist Clinical Goal(s):  Marland Kitchen Over the next 30 days, patient will work with PharmD and providers to relieve medication access concerns  Interventions: . Review online formulary for patient's Medicare plan and review Medicare Part D coverage with patient o Note Anoro inhaler not covered on plan formulary o Patient confirms currently using plan preferred pharmacy, [facility] . Review requirements for [employer] patient assistance program for Anoro. Patient denies currently meeting requirements for assistance program. . Identify Stiolto Respimat as therapeutic alternative covered under health plan formulary o Note Stiolto is a tier 3 option o Plan deductible does not apply for tier 1-3 medications . Will collaborate with patient's Pulmonologist to recommend therapeutic switch from Anoro to [place] for cost savings to patient . Inter-disciplinary care team collaboration (see longitudinal plan of care) . Schedule appointment to complete medication review with patient  Patient Self Care Activities:  . Patient to attend scheduled medical appointments o Next PCP visit scheduled for 4/22  Initial goal documentation  Plan  Telephone follow up appointment with care management team member scheduled for: 5/5 at 3 pm  Ethan Fitzgerald, PharmD, [place] [phone]

## 2019-12-27 ENCOUNTER — Other Ambulatory Visit: Payer: Self-pay

## 2019-12-27 ENCOUNTER — Encounter: Payer: Self-pay | Admitting: Family Medicine

## 2019-12-27 ENCOUNTER — Telehealth: Payer: Self-pay | Admitting: Pulmonary Disease

## 2019-12-27 ENCOUNTER — Ambulatory Visit (INDEPENDENT_AMBULATORY_CARE_PROVIDER_SITE_OTHER): Payer: Medicare HMO | Admitting: Family Medicine

## 2019-12-27 VITALS — BP 123/80 | HR 78 | Temp 97.3°F | Resp 16 | Ht 72.0 in | Wt 188.0 lb

## 2019-12-27 DIAGNOSIS — H3500 Unspecified background retinopathy: Secondary | ICD-10-CM

## 2019-12-27 DIAGNOSIS — E782 Mixed hyperlipidemia: Secondary | ICD-10-CM | POA: Diagnosis not present

## 2019-12-27 DIAGNOSIS — R7309 Other abnormal glucose: Secondary | ICD-10-CM

## 2019-12-27 MED ORDER — STIOLTO RESPIMAT 2.5-2.5 MCG/ACT IN AERS: 2.0000 | INHALATION_SPRAY | Freq: Every day | RESPIRATORY_TRACT | 6 refills | Status: DC

## 2019-12-27 NOTE — Progress Notes (Signed)
Subjective:    [name], male    DOB: [birth year], 64 y.o.   MRN: [id number]  Ethan Fitzgerald is a 64 y.o. male presenting on 12/27/2019 for hemorrhage in eye (Left side)   HPI   Suspected Background Diabetic Retinopathy Elevated A1c / Hyperlipidemia (Hypertriglyceridemia)  Prior history A1c 5.3 up to 5.8. Last reading 5.6 (10/2019). He also has had elevated lipids, with some familial component most likely. He has had elevated Total Chol 200+ up to 231 last time, LDL has been 100-130 range, and Triglycerides ranging 295 up to 415.  He was recently seen last weekend 12/24/19 at [facility], optometrist there was very concerned about retinopathy hemorrhages in back of eye. He was asked to see his primary doctor and check his sugar and cholesterol. Will request copy of this report.  He has no vision complaints except dry eye which is now better. Denies any loss of vision  He is improving lifestyle. But still admits to some poor dietary choices for cholesterol and eats chocolate often. He is taking Rosuvastatin 10mg  nightly tolerating well. He takes Fish Oil as well. Not on blood sugar medication. Never diagnosed with Type 2 Diabetes He does not have HTN. Meds:Never on meds Denies hypoglycemia, polyuria.   Health Maintenance: Awaiting 2nd dose COVID19 vaccine.  Last Prevnar13 04/03/19 - next due Pneumovax23 after 03/2020  Depression screen [facility] 2/9 12/27/2019 11/05/2019 05/08/2019  Decreased Interest 0 0 0  Down, Depressed, Hopeless 0 0 0  PHQ - 2 Score 0 0 0    Social History   Tobacco Use  . Smoking status: Former Smoker    Packs/day: 1.50    Years: 49.00    Pack years: 73.50    Types: Cigarettes    Start date: 1970    Quit date: 09/14/2017    Years since quitting: 2.2  . Smokeless tobacco: Former Network engineer Use Topics  . Alcohol use: Yes    Comment: 6 pack on weekend  . Drug use: No    Review of Systems Per HPI unless  specifically indicated above     Objective:    BP 123/80   Pulse 78   Temp (!) 97.3 F (36.3 C) (Temporal)   Resp 16   Ht 6' (1.829 m)   Wt 188 lb (85.3 kg)   BMI 25.50 kg/m   Wt Readings from Last 3 Encounters:  12/27/19 188 lb (85.3 kg)  12/19/19 185 lb (83.9 kg)  12/04/19 185 lb 8 oz (84.1 kg)    Physical Exam Vitals and nursing note reviewed.  Constitutional:      General: He is not in acute distress.    Appearance: He is well-developed. He is not diaphoretic.     Comments: Well-appearing, comfortable, cooperative  HENT:     Head: Normocephalic and atraumatic.  Eyes:     General:        Right eye: No discharge.        Left eye: No discharge.     Conjunctiva/sclera: Conjunctivae normal.  Cardiovascular:     Rate and Rhythm: Normal rate.  Pulmonary:     Effort: Pulmonary effort is normal.  Skin:    General: Skin is warm and dry.     Findings: No erythema or rash.  Neurological:     Mental Status: He is alert and oriented to person, place, and time.  Psychiatric:        Behavior: Behavior normal.  Comments: Well groomed, good eye contact, normal speech and thoughts        Results for orders placed or performed in visit on 12/21/19  Pulmonary function test  Result Value Ref Range   FVC-Pre 4.96 L   FVC-%Pred-Pre 96 %   FVC-Post 5.19 L   FVC-%Pred-Post 101 %   FVC-%Change-Post 4 %   FEV1-Pre 2.92 L   FEV1-%Pred-Pre 76 %   FEV1-Post 2.97 L   FEV1-%Pred-Post 77 %   FEV1-%Change-Post 1 %   FEV6-Pre 4.84 L   FEV6-%Pred-Pre 99 %   FEV6-Post 5.05 L   FEV6-%Pred-Post 103 %   FEV6-%Change-Post 4 %   Pre FEV1/FVC ratio 59 %   FEV1FVC-%Pred-Pre 78 %   Post FEV1/FVC ratio 57 %   FEV1FVC-%Change-Post -2 %   Pre FEV6/FVC Ratio 98 %   FEV6FVC-%Pred-Pre 102 %   Post FEV6/FVC ratio 97 %   FEV6FVC-%Pred-Post 102 %   FEV6FVC-%Change-Post 0 %   FEF 25-75 Pre 1.43 L/sec   FEF2575-%Pred-Pre 47 %   FEF 25-75 Post 1.51 L/sec   FEF2575-%Pred-Post 50 %    FEF2575-%Change-Post 5 %   DLCO unc 24.81 ml/min/mmHg   DLCO unc % pred 84 %   DLCO cor 24.81 ml/min/mmHg   DLCO cor % pred 84 %   DL/VA 3.05 ml/min/mmHg/L   DL/VA % pred 74 %   Recent Labs    03/27/19 0817 10/29/19 0812  HGBA1C 5.4 5.6        Assessment & Plan:   Problem List Items Addressed This Visit    Hyperlipidemia    Last lab 10/2019 Elevated TG now >400 still poor diet lately Suggestive of familial HLD component Last lipid panel 10/2019 Calculated ASCVD 10 yr risk score down to 13-15% now former smoker  Concern background diabetic retinopathy from optometrist, will return to them in about 4-6 months  Plan: 1. INCREASE Rosuvastatin from 10mg  up to 20mg  - take 2 pills at PM. 2. Continue Fish Oil - INCREASE up to 1000mg  x 2 = 2000mg  TWICE daily 3. Encourage improved lifestyle - low carb/cholesterol emphasized DASH / Mediterranean diet options, reduce portion size, improve regular exercise  Repeat labs in 4 months      Relevant Medications   rosuvastatin (CRESTOR) 10 MG tablet   Elevated hemoglobin A1c - Primary    Previously had A1c at 5.6 (10/2019) prior 5.3 to 5.4 range At risk of PreDM in future Concern HLD HyperTG, likely familial Concern now with some possible background diabetic retinopathy  Plan:  1. Not on any therapy currently  2. Encourage improved lifestyle - low carb, low sugar diet, reduce portion size, continue improving regular exercise - Emphasis on reducing chocolate and beer also REDUCE fried foods  F/u 4 months for physical with repeat labs  He should return to Optometry in 4 months after his labs again and bring report to them, if still abnormality we can consider referral to Retinal specialist for consultation if needed.       Other Visit Diagnoses    Background retinopathy         See A&P above, requesting record from [facility]   No orders of the defined types were placed in this encounter.    Follow up plan: Return in about  4 months (around 04/27/2020) for Annual Physical.  Future labs already ordered in system 04/2020   [name], [facility] 12/27/2019, 8:14 AM

## 2019-12-27 NOTE — Assessment & Plan Note (Signed)
Last lab 10/2019 Elevated TG now >400 still poor diet lately Suggestive of familial HLD component Last lipid panel 10/2019 Calculated ASCVD 10 yr risk score down to 13-15% now former smoker  Concern background diabetic retinopathy from optometrist, will return to them in about 4-6 months  Plan: 1. INCREASE Rosuvastatin from 10mg  up to 20mg  - take 2 pills at PM. 2. Continue Fish Oil - INCREASE up to 1000mg  x 2 = 2000mg  TWICE daily 3. Encourage improved lifestyle - low carb/cholesterol emphasized DASH / Mediterranean diet options, reduce portion size, improve regular exercise  Repeat labs in 4 months

## 2019-12-27 NOTE — Telephone Encounter (Signed)
-----   Message from Vella Raring, [facility] sent at 12/26/2019  3:58 PM EDT ----- Kermit Balo afternoon Ethan Fitzgerald am a pharmacist with [facility]. I had the opportunity to speak with [name] today regarding the cost of his Anoro inhaler. Anoro is not covered under patient's Part D plan. However, would you consider switching patient to [facility] Respimat, which is a tier 3 option ($45/month copayment) for [name]? Patient states this tier 3 copayment would be affordable to him.Thank you!Harlow Asa, PharmD, [phone]

## 2019-12-27 NOTE — Progress Notes (Signed)
Patient returned call and we spoke about PFT as well as switching from Anoro to [employer]. Patient agreed to this plan and prescription has been sent to [facility].  Nothing further needed at this time.

## 2019-12-27 NOTE — Patient Instructions (Addendum)
Thank you for coming to the office today.  Recent Labs    03/27/19 0817 10/29/19 0812  HGBA1C 5.4 5.6   Retinopathy or vascular changes in back of the eye, can be caused by any damage to the blood vessels - usually from diabetes, sugars, blood pressure, and cholesterol.  For you 2 out of the 3 are well controlled. - We can adjust the cholesterol treatment.  Increase Rosuvastatin 10mg  up to x 2 pills at bedtime = 20mg  - if need new rx I can order, let me know.  Improve cholesterol diet.  Re-check labs in 6 months with annual physical  DUE for FASTING BLOOD WORK (no food or drink after midnight before the lab appointment, only water or coffee without cream/sugar on the morning of)  SCHEDULE "Lab Only" visit in the morning at the clinic for lab draw in 6 MONTHS   - Make sure Lab Only appointment is at about 1 week before your next appointment, so that results will be available  For Lab Results, once available within 2-3 days of blood draw, you can can log in to MyChart online to view your results and a brief explanation. Also, we can discuss results at next follow-up visit.    Please schedule a Follow-up Appointment to: Return in about 4 months (around 04/27/2020) for Annual Physical.  If you have any other questions or concerns, please feel free to call the office or send a message through [place]. You may also schedule an earlier appointment if necessary.  Additionally, you may be receiving a survey about your experience at our office within a few days to 1 week by e-mail or mail. We value your feedback.  [name], [facility], [facility]    Fat and Cholesterol Restricted Eating Plan Eating a diet that limits fat and cholesterol may help lower your risk for heart disease and other conditions. Your body needs fat and cholesterol for basic functions, but eating too much of these things can be harmful to your health. Your health care provider may order  lab tests to check your blood fat (lipid) and cholesterol levels. This helps your health care provider understand your risk for certain conditions and whether you need to make diet changes. Work with your health care provider or dietitian to make an eating plan that is right for you. Your plan includes:  Limit your fat intake to ______% or less of your total calories a day.  Limit your saturated fat intake to ______% or less of your total calories a day.  Limit the amount of cholesterol in your diet to less than _________mg a day.  Eat ___________ g of fiber a day. What are tips for following this plan? General guidelines   If you are overweight, work with your health care provider to lose weight safely. Losing just 5-10% of your body weight can improve your overall health and help prevent diseases such as diabetes and heart disease.  Avoid: ? Foods with added sugar. ? Fried foods. ? Foods that contain partially hydrogenated oils, including stick margarine, some tub margarines, cookies, crackers, and other baked goods.  Limit alcohol intake to no more than 1 drink a day for nonpregnant women and 2 drinks a day for men. One drink equals 12 oz of beer, 5 oz of wine, or 1 oz of hard liquor. Reading food labels  Check food labels for: ? Trans fats, partially hydrogenated oils, or high amounts of saturated fat. Avoid foods that contain  saturated fat and trans fat. ? The amount of cholesterol in each serving. Try to eat no more than 200 mg of cholesterol each day. ? The amount of fiber in each serving. Try to eat at least 20-30 g of fiber each day.  Choose foods with healthy fats, such as: ? Monounsaturated and polyunsaturated fats. These include olive and canola oil, flaxseeds, walnuts, almonds, and seeds. ? Omega-3 fats. These are found in foods such as salmon, mackerel, sardines, tuna, flaxseed oil, and ground flaxseeds.  Choose grain products that have whole grains. Look for the word  "whole" as the first word in the ingredient list. Cooking  Cook foods using methods other than frying. Baking, boiling, grilling, and broiling are some healthy options.  Eat more home-cooked food and less restaurant, buffet, and fast food.  Avoid cooking using saturated fats. ? Animal sources of saturated fats include meats, butter, and cream. ? Plant sources of saturated fats include palm oil, palm kernel oil, and coconut oil. Meal planning   At meals, imagine dividing your plate into fourths: ? Fill one-half of your plate with vegetables and green salads. ? Fill one-fourth of your plate with whole grains. ? Fill one-fourth of your plate with lean protein foods.  Eat fish that is high in omega-3 fats at least two times a week.  Eat more foods that contain fiber, such as whole grains, beans, apples, broccoli, carrots, peas, and barley. These foods help promote healthy cholesterol levels in the blood. Recommended foods Grains  Whole grains, such as whole wheat or whole grain breads, crackers, cereals, and pasta. Unsweetened oatmeal, bulgur, barley, quinoa, or brown [place]. Corn or whole wheat flour tortillas. Vegetables  Fresh or frozen vegetables (raw, steamed, roasted, or grilled). Green salads. Fruits  All fresh, canned (in natural juice), or frozen fruits. Meats and other protein foods  Ground beef (85% or leaner), grass-fed beef, or beef trimmed of fat. Skinless chicken or [place]. Ground chicken or [place]. Pork trimmed of fat. All fish and seafood. Egg whites. Dried beans, peas, or lentils. Unsalted nuts or seeds. Unsalted canned beans. Natural nut butters without added sugar and oil. Dairy  Low-fat or nonfat dairy products, such as skim or 1% milk, 2% or reduced-fat cheeses, low-fat and fat-free ricotta or cottage cheese, or plain low-fat and nonfat yogurt. Fats and oils  Tub margarine without trans fats. Light or reduced-fat mayonnaise and salad dressings. Avocado. Olive,  canola, sesame, or safflower oils. The items listed above may not be a complete list of recommended foods or beverages. Contact your dietitian for more options. Foods to avoid Grains  White bread. White pasta. White [place]. Cornbread. Bagels, pastries, and croissants. Crackers and snack foods that contain trans fat and hydrogenated oils. Vegetables  Vegetables cooked in cheese, cream, or butter sauce. Fried vegetables. Fruits  Canned fruit in heavy syrup. Fruit in cream or butter sauce. Fried fruit. Meats and other protein foods  Fatty cuts of meat. Ribs, chicken wings, bacon, sausage, bologna, salami, chitterlings, fatback, hot dogs, bratwurst, and packaged lunch meats. Liver and organ meats. Whole eggs and egg yolks. Chicken and [place] with skin. Fried meat. Dairy  Whole or 2% milk, cream, half-and-half, and cream cheese. Whole milk cheeses. Whole-fat or sweetened yogurt. Full-fat cheeses. Nondairy creamers and whipped toppings. Processed cheese, cheese spreads, and cheese curds. Beverages  Alcohol. Sugar-sweetened drinks such as sodas, lemonade, and fruit drinks. Fats and oils  Butter, stick margarine, lard, shortening, ghee, or bacon fat. Coconut, palm kernel, and  palm oils. Sweets and desserts  Corn syrup, sugars, honey, and molasses. Candy. Jam and jelly. Syrup. Sweetened cereals. Cookies, pies, cakes, donuts, muffins, and ice cream. The items listed above may not be a complete list of foods and beverages to avoid. Contact your dietitian for more information. Summary  Your body needs fat and cholesterol for basic functions. However, eating too much of these things can be harmful to your health.  Work with your health care provider and dietitian to follow a diet low in fat and cholesterol. Doing this may help lower your risk for heart disease and other conditions.  Choose healthy fats, such as monounsaturated and polyunsaturated fats, and foods high in omega-3 fatty acids.  Eat  fiber-rich foods, such as whole grains, beans, peas, fruits, and vegetables.  Limit or avoid alcohol, fried foods, and foods high in saturated fats, partially hydrogenated oils, and sugar. This information is not intended to replace advice given to you by your health care provider. Make sure you discuss any questions you have with your health care provider. Document Revised: 08/05/2017 Document Reviewed: 05/10/2017 Elsevier Patient Education  [place].      Cholesterol Content in Foods Cholesterol is a waxy, fat-like substance that helps to carry fat in the blood. The body needs cholesterol in small amounts, but too much cholesterol can cause damage to the arteries and heart. Most people should eat less than 200 milligrams (mg) of cholesterol a day. Foods with cholesterol  Cholesterol is found in animal-based foods, such as meat, seafood, and dairy. Generally, low-fat dairy and lean meats have less cholesterol than full-fat dairy and fatty meats. The milligrams of cholesterol per serving (mg per serving) of common cholesterol-containing foods are listed below. Meat and other proteins  Egg -- one large whole egg has 186 mg.  Veal shank -- 4 oz has 141 mg.  Lean ground [place] (93% lean) -- 4 oz has 118 mg.  Fat-trimmed lamb loin -- 4 oz has 106 mg.  Lean ground beef (90% lean) -- 4 oz has 100 mg.  Lobster -- 3.5 oz has 90 mg.  Pork loin chops -- 4 oz has 86 mg.  Canned salmon -- 3.5 oz has 83 mg.  Fat-trimmed beef top loin -- 4 oz has 78 mg.  Frankfurter -- 1 frank (3.5 oz) has 77 mg.  Crab -- 3.5 oz has 71 mg.  Roasted chicken without skin, white meat -- 4 oz has 66 mg.  Light bologna -- 2 oz has 45 mg.  Deli-cut [place] -- 2 oz has 31 mg.  Canned tuna -- 3.5 oz has 31 mg.  Berniece Salines -- 1 oz has 29 mg.  Oysters and mussels (raw) -- 3.5 oz has 25 mg.  Mackerel -- 1 oz has 22 mg.  Trout -- 1 oz has 20 mg.  Pork sausage -- 1 link (1 oz) has 17 mg.  Salmon  -- 1 oz has 16 mg.  Tilapia -- 1 oz has 14 mg. Dairy  Soft-serve ice cream --  cup (4 oz) has 103 mg.  Whole-milk yogurt -- 1 cup (8 oz) has 29 mg.  Cheddar cheese -- 1 oz has 28 mg.  American cheese -- 1 oz has 28 mg.  Whole milk -- 1 cup (8 oz) has 23 mg.  2% milk -- 1 cup (8 oz) has 18 mg.  Cream cheese -- 1 tablespoon (Tbsp) has 15 mg.  Cottage cheese --  cup (4 oz) has 14 mg.  Low-fat (  1%) milk -- 1 cup (8 oz) has 10 mg.  Sour cream -- 1 Tbsp has 8.5 mg.  Low-fat yogurt -- 1 cup (8 oz) has 8 mg.  Nonfat Greek yogurt -- 1 cup (8 oz) has 7 mg.  Half-and-half cream -- 1 Tbsp has 5 mg. Fats and oils  Cod liver oil -- 1 tablespoon (Tbsp) has 82 mg.  Butter -- 1 Tbsp has 15 mg.  Lard -- 1 Tbsp has 14 mg.  Bacon grease -- 1 Tbsp has 14 mg.  Mayonnaise -- 1 Tbsp has 5-10 mg.  Margarine -- 1 Tbsp has 3-10 mg. Exact amounts of cholesterol in these foods may vary depending on specific ingredients and brands. Foods without cholesterol Most plant-based foods do not have cholesterol unless you combine them with a food that has cholesterol. Foods without cholesterol include:  Grains and cereals.  Vegetables.  Fruits.  Vegetable oils, such as olive, canola, and sunflower oil.  Legumes, such as peas, beans, and lentils.  Nuts and seeds.  Egg whites. Summary  The body needs cholesterol in small amounts, but too much cholesterol can cause damage to the arteries and heart.  Most people should eat less than 200 milligrams (mg) of cholesterol a day. This information is not intended to replace advice given to you by your health care provider. Make sure you discuss any questions you have with your health care provider. Document Revised: 08/05/2017 Document Reviewed: 04/19/2017 Elsevier Patient Education  [place].

## 2019-12-27 NOTE — Assessment & Plan Note (Signed)
Previously had A1c at 5.6 (10/2019) prior 5.3 to 5.4 range At risk of PreDM in future Concern HLD HyperTG, likely familial Concern now with some possible background diabetic retinopathy  Plan:  1. Not on any therapy currently  2. Encourage improved lifestyle - low carb, low sugar diet, reduce portion size, continue improving regular exercise - Emphasis on reducing chocolate and beer also REDUCE fried foods  F/u 4 months for physical with repeat labs  He should return to Optometry in 4 months after his labs again and bring report to them, if still abnormality we can consider referral to Retinal specialist for consultation if needed.

## 2019-12-27 NOTE — Telephone Encounter (Signed)
ATC patient in regards to his PFT results and needing to change his inhaler. LMTCB

## 2019-12-27 NOTE — Telephone Encounter (Signed)
Patient returned call and we spoke about PFT as well as switching from Anoro to [employer]. Patient agreed to this plan and prescription has been sent to [facility].  Nothing further needed at this time.

## 2019-12-27 NOTE — Telephone Encounter (Signed)
12/27/2019  Rea triage:  Please see the note listed below.  This is one of our [place] patients last seen in our office in 12/04/2019.  At that time patient was trialed on Anoro Ellipta.  Which is not covered by his insurance.  Stiolto Respimat is a affordable option.  Can we please contact the patient and see if he is agreeable to switching to [employer] Respimat.  I am fine with this.  Okay to place order.  Patient would benefit from inhaler teaching at our office.  Stiolto Respimat inhaler >>>2 puffs daily >>>Take this no matter what >>>This is not a rescue inhaler  6 refills   Wyn Quaker FNP

## 2020-01-09 ENCOUNTER — Ambulatory Visit: Payer: Self-pay | Admitting: Pharmacist

## 2020-01-09 DIAGNOSIS — J432 Centrilobular emphysema: Secondary | ICD-10-CM

## 2020-01-09 NOTE — Patient Instructions (Signed)
Thank you allowing the Chronic Care Management Team to be a part of your care! It was a pleasure speaking with you today!     CCM (Chronic Care Management) Team    Noreene Larsson RN, MSN, CCM Nurse Care Coordinator  [phone]   Harlow Asa PharmD  Clinical Pharmacist  [phone]   Eula Fried LCSW Clinical Social Worker [phone]  Visit Information  Goals Addressed            This Visit's Progress   . PharmD - "I can't afford my inhaler" (pt-stated)       CARE PLAN ENTRY (see longtitudinal plan of care for additional care plan information)  Current Barriers:  . Financial Barriers in complicated patient with multiple medical conditions including COPD and HLD; patient has [employer] Medicare Part D insurance and reports copay for Anoro inhaler is cost prohibitive at this time  Pharmacist Clinical Goal(s):  Marland Kitchen Over the next 30 days, patient will work with PharmD and providers to relieve medication access concerns  Interventions: . Received coordination of care message back from patient's Pulmonologist. Provider agreed to therapeutic switch from Anoro to [place] for cost savings to patient, provider sent Rx to pharmacy . Follow up with patient regarding Stiolto Respimat o Patient reports picked up Stiolto and using as directed in place of Anoro inhaler o Denies questions regarding administration of inhaler. o Confirms tier 3 copayment is affordable to him . Reschedule appointment to complete medication review with patient as requested. Reports does not have medications or list with him now.  Patient Self Care Activities:  . Patient to attend scheduled medical appointments  Please see past updates related to this goal by clicking on the "Past Updates" button in the selected goal         Patient verbalizes understanding of instructions provided today.   Telephone follow up appointment with care management team member scheduled for: 5/26 at 2  pm  Harlow Asa, PharmD, [place] [phone]

## 2020-01-09 NOTE — Chronic Care Management (AMB) (Signed)
Chronic Care Management   Follow Up Note   01/09/2020 Name: Ethan Fitzgerald MRN: [id number] DOB: 28-Apr-1956  Referred by: Olin Hauser, DO Reason for referral : Chronic Care Management (Patient Phone Call)   Ethan Fitzgerald is a 64 y.o. year old male who is a primary care patient of Olin Hauser, DO. The CCM team was consulted for assistance with chronic disease management and care coordination needs.    I reached out to Janene Harvey by phone today.   Review of patient status, including review of consultants reports, relevant laboratory and other test results, and collaboration with appropriate care team members and the patient's provider was performed as part of comprehensive patient evaluation and provision of chronic care management services.      Outpatient Encounter Medications as of 01/09/2020  Medication Sig  . Tiotropium Bromide-Olodaterol (STIOLTO RESPIMAT) 2.5-2.5 MCG/ACT AERS Inhale 2 puffs into the lungs daily.  Marland Kitchen albuterol (VENTOLIN HFA) 108 (90 Base) MCG/ACT inhaler Inhale 1-2 puffs into the lungs every 4 (four) hours as needed for wheezing or shortness of breath.  . Aspirin-Acetaminophen-Caffeine (EXCEDRIN PO) Take 2 tablets by mouth. am  . cyclobenzaprine (FLEXERIL) 10 MG tablet Take 1 tablet (10 mg total) by mouth 3 (three) times daily as needed for muscle spasms.  . naproxen (NAPROSYN) 500 MG tablet TAKE 1 TABLET (500 MG TOTAL) BY MOUTH 2 (TWO) TIMES DAILY WITH A MEAL. FOR 2-4 WEEKS THEN AS NEEDED  . Omega-3 Fatty Acids (FISH OIL) 1000 MG CAPS Take 2,000 mg by mouth in the morning and at bedtime.  . rosuvastatin (CRESTOR) 10 MG tablet Take 2 tablets (20 mg total) by mouth daily.  . sildenafil (REVATIO) 20 MG tablet TAKE 1 TO 5 TABLETS BY MOUTH ABOUT 30 MINUTES PRIOR TO SEX.START WITH 1 THEN INCREASE  . vitamin C (ASCORBIC ACID) 500 MG tablet Take 500 mg by mouth daily. Am   No facility-administered encounter medications on file as of 01/09/2020.     Goals Addressed            This Visit's Progress   . PharmD - "I can't afford my inhaler" (pt-stated)       CARE PLAN ENTRY (see longtitudinal plan of care for additional care plan information)  Current Barriers:  . Financial Barriers in complicated patient with multiple medical conditions including COPD and HLD; patient has [employer] Medicare Part D insurance and reports copay for Anoro inhaler is cost prohibitive at this time  Pharmacist Clinical Goal(s):  Marland Kitchen Over the next 30 days, patient will work with PharmD and providers to relieve medication access concerns  Interventions: . Received coordination of care message back from patient's Pulmonologist. Provider agreed to therapeutic switch from Anoro to [place] for cost savings to patient, provider sent Rx to pharmacy . Follow up with patient regarding Stiolto Respimat o Patient reports picked up Stiolto and using as directed in place of Anoro inhaler o Denies questions regarding administration of inhaler. o Confirms tier 3 copayment is affordable to him . Reschedule appointment to complete medication review with patient as requested. Reports does not have medications or list with him now.  Patient Self Care Activities:  . Patient to attend scheduled medical appointments  Please see past updates related to this goal by clicking on the "Past Updates" button in the selected goal         Plan  Telephone follow up appointment with care management team member scheduled for: 5/26 at 2 pm  Harlow Asa, PharmD, [place] [employer] [phone]

## 2020-01-21 ENCOUNTER — Telehealth: Payer: Self-pay

## 2020-01-21 ENCOUNTER — Ambulatory Visit: Payer: Self-pay | Admitting: General Practice

## 2020-01-21 NOTE — Chronic Care Management (AMB) (Signed)
°  Chronic Care Management   Outreach Note  01/21/2020 Name: Ethan Fitzgerald MRN: [id number] DOB: [birth year]  Referred by: Olin Hauser, DO Reason for referral : Care Coordination (2nd attmept:Follow up on Centrilobular Emphsema and HLD- inhaler)   A second unsuccessful telephone outreach was attempted today. The patient was referred to the case management team for assistance with care management and care coordination. The patient answered and stated it was a busy time and he did not have time to speak to the [facility] today. The patient was at work. The patient has the [facility] number.   Follow Up Plan: The care management team will reach out to the patient again over the next 30 to 60  days.   Noreene Larsson RN, MSN, [place] Mobile: [phone]

## 2020-01-30 ENCOUNTER — Ambulatory Visit: Payer: Self-pay | Admitting: Pharmacist

## 2020-01-30 ENCOUNTER — Telehealth: Payer: Self-pay

## 2020-01-30 NOTE — Chronic Care Management (AMB) (Signed)
  Care Management   Follow Up Note   01/30/2020 Name: Ethan Fitzgerald MRN: [id number] DOB: [birth year]  Referred by: Olin Hauser, DO Reason for referral : Chronic Care Management (Patient Phone Call)   Ethan Fitzgerald is a 64 y.o. year old male who is a primary care patient of Olin Hauser, DO. The care management team was consulted for assistance with care management and care coordination needs.    Was unable to reach patient via telephone today and have left HIPAA compliant voicemail asking patient to return my call.   Plan  The care management team will reach out to the patient again over the next 30 days.   Harlow Asa, PharmD, [place] [employer] [phone]

## 2020-02-22 ENCOUNTER — Telehealth: Payer: Self-pay

## 2020-03-06 ENCOUNTER — Ambulatory Visit: Payer: Self-pay | Admitting: General Practice

## 2020-03-06 ENCOUNTER — Telehealth: Payer: Self-pay

## 2020-03-06 NOTE — Chronic Care Management (AMB) (Signed)
  Chronic Care Management   Outreach Note  03/06/2020 Name: Ethan Fitzgerald MRN: [id number] DOB: 12/08/1955  Referred by: Olin Hauser, DO Reason for referral : Chronic Care Management (RNCM Initial: 3rd attempt for Chronic Disease Management and Care Coordination Needs)   Third unsuccessful telephone outreach was attempted today. The patient was referred to the case management team for assistance with care management and care coordination. The patient's primary care provider has been notified of our unsuccessful attempts to make or maintain contact with the patient. The care management team is pleased to engage with this patient at any time in the future should he/she be interested in assistance from the care management team.   Follow Up Plan: The care management team is available to follow up with the patient after provider conversation with the patient regarding recommendation for care management engagement and subsequent re-referral to the care management team.   Noreene Larsson RN, MSN, [facility] Mobile: [phone]

## 2020-03-14 ENCOUNTER — Ambulatory Visit: Payer: Self-pay | Admitting: Pharmacist

## 2020-03-14 ENCOUNTER — Telehealth: Payer: Self-pay

## 2020-03-14 NOTE — Chronic Care Management (AMB) (Signed)
  Care Management   Follow Up Note   03/14/2020 Name: Ethan Fitzgerald MRN: [id number] DOB: Dec 19, 1955  Referred by: Olin Hauser, DO Reason for referral : Chronic Care Management (Patient Phone Call)   Ethan Fitzgerald is a 64 y.o. year old male who is a primary care patient of Olin Hauser, DO. The care management team was consulted for assistance with care management and care coordination needs.    Was unable to reach patient via telephone today and have left HIPAA compliant voicemail asking patient to return my call. Outreach attempt #2.  Plan  The care management team will reach out to the patient again over the next 30 days.   Harlow Asa, PharmD, [place] [employer] [phone]

## 2020-03-21 ENCOUNTER — Other Ambulatory Visit: Payer: Self-pay

## 2020-03-21 ENCOUNTER — Encounter: Payer: Self-pay | Admitting: Family Medicine

## 2020-03-21 ENCOUNTER — Ambulatory Visit (INDEPENDENT_AMBULATORY_CARE_PROVIDER_SITE_OTHER): Payer: Medicare HMO | Admitting: Family Medicine

## 2020-03-21 DIAGNOSIS — J209 Acute bronchitis, unspecified: Secondary | ICD-10-CM | POA: Diagnosis not present

## 2020-03-21 DIAGNOSIS — J440 Chronic obstructive pulmonary disease with acute lower respiratory infection: Secondary | ICD-10-CM | POA: Diagnosis not present

## 2020-03-21 MED ORDER — PREDNISONE 50 MG PO TABS: 50.0000 mg | ORAL_TABLET | Freq: Every day | ORAL | 0 refills | Status: DC

## 2020-03-21 MED ORDER — AZITHROMYCIN 250 MG PO TABS: ORAL_TABLET | ORAL | 0 refills | Status: DC

## 2020-03-21 MED ORDER — GUAIFENESIN-CODEINE 100-10 MG/5ML PO SYRP: 5.0000 mL | ORAL_SOLUTION | Freq: Three times a day (TID) | ORAL | 0 refills | Status: DC | PRN

## 2020-03-21 NOTE — Progress Notes (Signed)
Virtual Visit via Telephone The purpose of this virtual visit is to provide medical care while limiting exposure to the novel coronavirus (COVID19) for both patient and office staff.  Consent was obtained for phone visit:  Yes.   Answered questions that patient had about telehealth interaction:  Yes.   I discussed the limitations, risks, security and privacy concerns of performing an evaluation and management service by telephone. I also discussed with the patient that there may be a patient responsible charge related to this service. The patient expressed understanding and agreed to proceed.  Patient Location: Home Provider Location: [facility]  ---------------------------------------------------------------------- Chief Complaint  Patient presents with  . Cough    chest congestion, nasal congestion but denies fever, HA or SOB onset 3 days cough is getting worst    S: Reviewed CMA documentation. I have called patient and gathered additional HPI as follows:  Acute bronchitis / COPD Reports that symptoms started 3 days ago, with worsening sinus congestion cough, sick contact wife with head cold URI. - Tried OTC robitussin, temporary relief - Followed by Pulm on Stiolto maintenance improved, rarely uses Albuterol - In past rx codeine cough syrup has helped He is prone to developing worsening bronchitis with COPD and wanted to be treated sooner  UTD COVID19 vaccine  Denies any high risk travel to areas of current concern for COVID19. Denies any known or suspected exposure to person with or possibly with COVID19.  Denies any fevers, chills, sweats, body ache, shortness of breath, sinus pain or pressure, headache, abdominal pain, diarrhea  Past Medical History:  Diagnosis Date  . COPD (chronic obstructive pulmonary disease) (HCC)    mild  . Erectile dysfunction   . Headache    daily, stress headaches  . Knee pain, left   . Seasonal allergies   . Tobacco  dependence    Social History   Tobacco Use  . Smoking status: Former Smoker    Packs/day: 1.50    Years: 49.00    Pack years: 73.50    Types: Cigarettes    Start date: 1970    Quit date: 09/14/2017    Years since quitting: 2.5  . Smokeless tobacco: Former Network engineer  . Vaping Use: Never used  Substance Use Topics  . Alcohol use: Yes    Comment: 6 pack on weekend  . Drug use: No    Current Outpatient Medications:  .  albuterol (VENTOLIN HFA) 108 (90 Base) MCG/ACT inhaler, Inhale 1-2 puffs into the lungs every 4 (four) hours as needed for wheezing or shortness of breath., Disp: 18 g, Rfl: 2 .  Aspirin-Acetaminophen-Caffeine (EXCEDRIN PO), Take 2 tablets by mouth. am, Disp: , Rfl:  .  cyclobenzaprine (FLEXERIL) 10 MG tablet, Take 1 tablet (10 mg total) by mouth 3 (three) times daily as needed for muscle spasms., Disp: 60 tablet, Rfl: 1 .  naproxen (NAPROSYN) 500 MG tablet, TAKE 1 TABLET (500 MG TOTAL) BY MOUTH 2 (TWO) TIMES DAILY WITH A MEAL. FOR 2-4 WEEKS THEN AS NEEDED, Disp: 60 tablet, Rfl: 2 .  Omega-3 Fatty Acids (FISH OIL) 1000 MG CAPS, Take 2,000 mg by mouth in the morning and at bedtime., Disp: , Rfl:  .  rosuvastatin (CRESTOR) 10 MG tablet, Take 2 tablets (20 mg total) by mouth daily., Disp: 90 tablet, Rfl: 1 .  sildenafil (REVATIO) 20 MG tablet, TAKE 1 TO 5 TABLETS BY MOUTH ABOUT 30 MINUTES PRIOR TO SEX.START WITH 1 THEN INCREASE, Disp: 30 tablet,  Rfl: 5 .  Tiotropium Bromide-Olodaterol (STIOLTO RESPIMAT) 2.5-2.5 MCG/ACT AERS, Inhale 2 puffs into the lungs daily., Disp: 4 g, Rfl: 6 .  vitamin C (ASCORBIC ACID) 500 MG tablet, Take 500 mg by mouth daily. Am, Disp: , Rfl:  .  azithromycin (ZITHROMAX Z-PAK) 250 MG tablet, Take 2 tabs (500mg  total) on Day 1. Take 1 tab (250mg ) daily for next 4 days., Disp: 6 tablet, Rfl: 0 .  guaiFENesin-codeine (ROBITUSSIN AC) 100-10 MG/5ML syrup, Take 5-10 mLs by mouth 3 (three) times daily as needed for cough., Disp: 180 mL, Rfl: 0 .   predniSONE (DELTASONE) 50 MG tablet, Take 1 tablet (50 mg total) by mouth daily with breakfast., Disp: 5 tablet, Rfl: 0  Depression screen [facility] 2/9 12/27/2019 11/05/2019 05/08/2019  Decreased Interest 0 0 0  Down, Depressed, Hopeless 0 0 0  PHQ - 2 Score 0 0 0    No flowsheet data found.  -------------------------------------------------------------------------- O: No physical exam performed due to remote telephone encounter.  Lab results reviewed.  No results found for this or any previous visit (from the past 2160 hour(s)).  -------------------------------------------------------------------------- A&P:  Problem List Items Addressed This Visit    None    Visit Diagnoses    Acute bronchitis with COPD ([place])    -  Primary   Relevant Medications   azithromycin (ZITHROMAX Z-PAK) 250 MG tablet   predniSONE (DELTASONE) 50 MG tablet   guaiFENesin-codeine (ROBITUSSIN AC) 100-10 MG/5ML syrup     Consistent with mild acute exacerbation of COPD with worsening productive cough bronchitis. Similar to prior exacerbations. Afebrile COVID vaccine up to date  Plan: 1. Start Azithromycin Z pak (antibiotic) 2 tabs day 1, then 1 tab x 4 days, complete entire course even if improved 2. If not improved -  Start Prednisone 50mg  x 5 day steroid burst 3. Guaifenesin codeine cough syrup PRN precautions reviewed 4. Use albuterol PRN RTC about 1 week if not improving, otherwise strict return criteria to go to ED   Meds ordered this encounter  Medications  . azithromycin (ZITHROMAX Z-PAK) 250 MG tablet    Sig: Take 2 tabs (500mg  total) on Day 1. Take 1 tab (250mg ) daily for next 4 days.    Dispense:  6 tablet    Refill:  0  . predniSONE (DELTASONE) 50 MG tablet    Sig: Take 1 tablet (50 mg total) by mouth daily with breakfast.    Dispense:  5 tablet    Refill:  0  . guaiFENesin-codeine (ROBITUSSIN AC) 100-10 MG/5ML syrup    Sig: Take 5-10 mLs by mouth 3 (three) times daily as needed for cough.     Dispense:  180 mL    Refill:  0    Follow-up: - Return in 1 week if not improved  Patient verbalizes understanding with the above medical recommendations including the limitation of remote medical advice.  Specific follow-up and call-back criteria were given for patient to follow-up or seek medical care more urgently if needed.   - Time spent in direct consultation with patient on phone: 8 minutes  [name], [facility] 03/21/2020, 9:32 AM

## 2020-03-27 ENCOUNTER — Inpatient Hospital Stay (HOSPITAL_COMMUNITY): Payer: Medicare HMO | Admitting: Anesthesiology

## 2020-03-27 ENCOUNTER — Inpatient Hospital Stay (HOSPITAL_COMMUNITY): Payer: Medicare HMO

## 2020-03-27 ENCOUNTER — Inpatient Hospital Stay (HOSPITAL_COMMUNITY)
Admission: EM | Admit: 2020-03-27 | Discharge: 2020-04-01 | DRG: 023 | Disposition: A | Discharge status: Rehabilitation Facility | Payer: Medicare HMO | Attending: Neurology | Admitting: Neurology

## 2020-03-27 ENCOUNTER — Emergency Department (HOSPITAL_COMMUNITY): Payer: Medicare HMO

## 2020-03-27 ENCOUNTER — Encounter (HOSPITAL_COMMUNITY)
Admission: EM | Disposition: A | Discharge status: Rehabilitation Facility | Payer: Self-pay | Source: Home / Self Care | Attending: Neurology

## 2020-03-27 DIAGNOSIS — R29810 Facial weakness: Secondary | ICD-10-CM | POA: Diagnosis present

## 2020-03-27 DIAGNOSIS — I959 Hypotension, unspecified: Secondary | ICD-10-CM | POA: Diagnosis not present

## 2020-03-27 DIAGNOSIS — Z87891 Personal history of nicotine dependence: Secondary | ICD-10-CM | POA: Diagnosis not present

## 2020-03-27 DIAGNOSIS — I609 Nontraumatic subarachnoid hemorrhage, unspecified: Secondary | ICD-10-CM | POA: Diagnosis not present

## 2020-03-27 DIAGNOSIS — R1312 Dysphagia, oropharyngeal phase: Secondary | ICD-10-CM | POA: Diagnosis not present

## 2020-03-27 DIAGNOSIS — N529 Male erectile dysfunction, unspecified: Secondary | ICD-10-CM | POA: Diagnosis not present

## 2020-03-27 DIAGNOSIS — Z800 Family history of malignant neoplasm of digestive organs: Secondary | ICD-10-CM | POA: Diagnosis not present

## 2020-03-27 DIAGNOSIS — I69391 Dysphagia following cerebral infarction: Secondary | ICD-10-CM

## 2020-03-27 DIAGNOSIS — I639 Cerebral infarction, unspecified: Secondary | ICD-10-CM | POA: Diagnosis not present

## 2020-03-27 DIAGNOSIS — I6523 Occlusion and stenosis of bilateral carotid arteries: Secondary | ICD-10-CM | POA: Diagnosis not present

## 2020-03-27 DIAGNOSIS — R0689 Other abnormalities of breathing: Secondary | ICD-10-CM | POA: Diagnosis not present

## 2020-03-27 DIAGNOSIS — M25512 Pain in left shoulder: Secondary | ICD-10-CM

## 2020-03-27 DIAGNOSIS — Z825 Family history of asthma and other chronic lower respiratory diseases: Secondary | ICD-10-CM | POA: Diagnosis not present

## 2020-03-27 DIAGNOSIS — E7800 Pure hypercholesterolemia, unspecified: Secondary | ICD-10-CM | POA: Diagnosis not present

## 2020-03-27 DIAGNOSIS — I6601 Occlusion and stenosis of right middle cerebral artery: Secondary | ICD-10-CM | POA: Diagnosis present

## 2020-03-27 DIAGNOSIS — Z8249 Family history of ischemic heart disease and other diseases of the circulatory system: Secondary | ICD-10-CM | POA: Diagnosis not present

## 2020-03-27 DIAGNOSIS — J441 Chronic obstructive pulmonary disease with (acute) exacerbation: Secondary | ICD-10-CM | POA: Diagnosis present

## 2020-03-27 DIAGNOSIS — E782 Mixed hyperlipidemia: Secondary | ICD-10-CM | POA: Diagnosis not present

## 2020-03-27 DIAGNOSIS — J449 Chronic obstructive pulmonary disease, unspecified: Secondary | ICD-10-CM | POA: Diagnosis not present

## 2020-03-27 DIAGNOSIS — J432 Centrilobular emphysema: Secondary | ICD-10-CM | POA: Diagnosis present

## 2020-03-27 DIAGNOSIS — I63311 Cerebral infarction due to thrombosis of right middle cerebral artery: Secondary | ICD-10-CM | POA: Diagnosis not present

## 2020-03-27 DIAGNOSIS — E785 Hyperlipidemia, unspecified: Secondary | ICD-10-CM | POA: Diagnosis not present

## 2020-03-27 DIAGNOSIS — G459 Transient cerebral ischemic attack, unspecified: Secondary | ICD-10-CM | POA: Diagnosis not present

## 2020-03-27 DIAGNOSIS — G936 Cerebral edema: Secondary | ICD-10-CM | POA: Diagnosis present

## 2020-03-27 DIAGNOSIS — J9601 Acute respiratory failure with hypoxia: Secondary | ICD-10-CM | POA: Diagnosis present

## 2020-03-27 DIAGNOSIS — I63411 Cerebral infarction due to embolism of right middle cerebral artery: Secondary | ICD-10-CM | POA: Diagnosis not present

## 2020-03-27 DIAGNOSIS — I6389 Other cerebral infarction: Secondary | ICD-10-CM | POA: Diagnosis not present

## 2020-03-27 DIAGNOSIS — K118 Other diseases of salivary glands: Secondary | ICD-10-CM | POA: Diagnosis present

## 2020-03-27 DIAGNOSIS — R7303 Prediabetes: Secondary | ICD-10-CM | POA: Diagnosis not present

## 2020-03-27 DIAGNOSIS — Z830 Family history of human immunodeficiency virus [HIV] disease: Secondary | ICD-10-CM

## 2020-03-27 DIAGNOSIS — E876 Hypokalemia: Secondary | ICD-10-CM | POA: Diagnosis present

## 2020-03-27 DIAGNOSIS — I10 Essential (primary) hypertension: Secondary | ICD-10-CM | POA: Diagnosis not present

## 2020-03-27 DIAGNOSIS — J9811 Atelectasis: Secondary | ICD-10-CM | POA: Diagnosis not present

## 2020-03-27 DIAGNOSIS — Z833 Family history of diabetes mellitus: Secondary | ICD-10-CM | POA: Diagnosis not present

## 2020-03-27 DIAGNOSIS — R0902 Hypoxemia: Secondary | ICD-10-CM | POA: Diagnosis not present

## 2020-03-27 DIAGNOSIS — R29818 Other symptoms and signs involving the nervous system: Secondary | ICD-10-CM | POA: Diagnosis not present

## 2020-03-27 DIAGNOSIS — I63231 Cerebral infarction due to unspecified occlusion or stenosis of right carotid arteries: Secondary | ICD-10-CM | POA: Diagnosis not present

## 2020-03-27 DIAGNOSIS — Z978 Presence of other specified devices: Secondary | ICD-10-CM

## 2020-03-27 DIAGNOSIS — I6529 Occlusion and stenosis of unspecified carotid artery: Secondary | ICD-10-CM | POA: Diagnosis present

## 2020-03-27 DIAGNOSIS — Z930 Tracheostomy status: Secondary | ICD-10-CM | POA: Diagnosis not present

## 2020-03-27 DIAGNOSIS — Z20822 Contact with and (suspected) exposure to covid-19: Secondary | ICD-10-CM | POA: Diagnosis present

## 2020-03-27 DIAGNOSIS — Z8616 Personal history of COVID-19: Secondary | ICD-10-CM

## 2020-03-27 DIAGNOSIS — R1310 Dysphagia, unspecified: Secondary | ICD-10-CM | POA: Diagnosis not present

## 2020-03-27 DIAGNOSIS — M25511 Pain in right shoulder: Secondary | ICD-10-CM | POA: Diagnosis present

## 2020-03-27 DIAGNOSIS — G8190 Hemiplegia, unspecified affecting unspecified side: Secondary | ICD-10-CM | POA: Diagnosis not present

## 2020-03-27 DIAGNOSIS — I6300 Cerebral infarction due to thrombosis of unspecified precerebral artery: Secondary | ICD-10-CM | POA: Diagnosis not present

## 2020-03-27 DIAGNOSIS — R29709 NIHSS score 9: Secondary | ICD-10-CM | POA: Diagnosis not present

## 2020-03-27 DIAGNOSIS — I63511 Cerebral infarction due to unspecified occlusion or stenosis of right middle cerebral artery: Secondary | ICD-10-CM | POA: Diagnosis not present

## 2020-03-27 DIAGNOSIS — I69354 Hemiplegia and hemiparesis following cerebral infarction affecting left non-dominant side: Secondary | ICD-10-CM | POA: Diagnosis not present

## 2020-03-27 DIAGNOSIS — J439 Emphysema, unspecified: Secondary | ICD-10-CM | POA: Diagnosis not present

## 2020-03-27 DIAGNOSIS — R531 Weakness: Secondary | ICD-10-CM | POA: Diagnosis not present

## 2020-03-27 DIAGNOSIS — G8194 Hemiplegia, unspecified affecting left nondominant side: Secondary | ICD-10-CM | POA: Diagnosis present

## 2020-03-27 DIAGNOSIS — I6602 Occlusion and stenosis of left middle cerebral artery: Secondary | ICD-10-CM | POA: Diagnosis not present

## 2020-03-27 DIAGNOSIS — K5900 Constipation, unspecified: Secondary | ICD-10-CM | POA: Diagnosis not present

## 2020-03-27 DIAGNOSIS — R471 Dysarthria and anarthria: Secondary | ICD-10-CM | POA: Diagnosis not present

## 2020-03-27 DIAGNOSIS — I63233 Cerebral infarction due to unspecified occlusion or stenosis of bilateral carotid arteries: Secondary | ICD-10-CM | POA: Diagnosis not present

## 2020-03-27 DIAGNOSIS — S4992XA Unspecified injury of left shoulder and upper arm, initial encounter: Secondary | ICD-10-CM | POA: Diagnosis not present

## 2020-03-27 HISTORY — PX: IR ANGIO VERTEBRAL SEL VERTEBRAL UNI R MOD SED: IMG5368

## 2020-03-27 HISTORY — PX: IR PERCUTANEOUS ART THROMBECTOMY/INFUSION INTRACRANIAL INC DIAG ANGIO: IMG6087

## 2020-03-27 HISTORY — PX: IR ANGIO INTRA EXTRACRAN SEL COM CAROTID INNOMINATE UNI L MOD SED: IMG5358

## 2020-03-27 HISTORY — PX: IR ANGIO VERTEBRAL SEL SUBCLAVIAN INNOMINATE UNI L MOD SED: IMG5364

## 2020-03-27 HISTORY — PX: RADIOLOGY WITH ANESTHESIA: SHX6223

## 2020-03-27 HISTORY — PX: IR CT HEAD LTD: IMG2386

## 2020-03-27 LAB — LIPID PANEL
Cholesterol: 153 mg/dL (ref 0–200)
HDL: 48 mg/dL (ref >40)
LDL Cholesterol: 82 mg/dL (ref 0–99)
Total CHOL/HDL Ratio: 3.2 RATIO
Triglycerides: 115 mg/dL (ref <150)
VLDL: 23 mg/dL (ref 0–40)

## 2020-03-27 LAB — DIFFERENTIAL
Abs Immature Granulocytes: 0.03 10*3/uL (ref 0.00–0.07)
Basophils Absolute: 0 10*3/uL (ref 0.0–0.1)
Basophils Relative: 0 %
Eosinophils Absolute: 0.1 10*3/uL (ref 0.0–0.5)
Eosinophils Relative: 1 %
Immature Granulocytes: 0 %
Lymphocytes Relative: 26 %
Lymphs Abs: 2.3 10*3/uL (ref 0.7–4.0)
Monocytes Absolute: 0.6 10*3/uL (ref 0.1–1.0)
Monocytes Relative: 7 %
Neutro Abs: 6 10*3/uL (ref 1.7–7.7)
Neutrophils Relative %: 66 %

## 2020-03-27 LAB — CBC
HCT: 39.9 % (ref 39.0–52.0)
HCT: 47 % (ref 39.0–52.0)
Hemoglobin: 13.2 g/dL (ref 13.0–17.0)
Hemoglobin: 15.9 g/dL (ref 13.0–17.0)
MCH: 32.6 pg (ref 26.0–34.0)
MCH: 33.3 pg (ref 26.0–34.0)
MCHC: 33.1 g/dL (ref 30.0–36.0)
MCHC: 33.8 g/dL (ref 30.0–36.0)
MCV: 98.5 fL (ref 80.0–100.0)
MCV: 98.5 fL (ref 80.0–100.0)
Platelets: 245 10*3/uL (ref 150–400)
Platelets: 269 10*3/uL (ref 150–400)
RBC: 4.05 MIL/uL — ABNORMAL LOW (ref 4.22–5.81)
RBC: 4.77 MIL/uL (ref 4.22–5.81)
RDW: 13 % (ref 11.5–15.5)
RDW: 13.2 % (ref 11.5–15.5)
WBC: 9 10*3/uL (ref 4.0–10.5)
WBC: 16.3 10*3/uL — ABNORMAL HIGH (ref 4.0–10.5)
nRBC: 0 % (ref 0.0–0.2)
nRBC: 0 % (ref 0.0–0.2)

## 2020-03-27 LAB — MRSA PCR SCREENING: MRSA by PCR: NEGATIVE

## 2020-03-27 LAB — ECHOCARDIOGRAM COMPLETE
Area-P 1/2: 2.62 cm2
Height: 72 in
S' Lateral: 3.5 cm
Weight: 2784 oz

## 2020-03-27 LAB — APTT: aPTT: 24 seconds (ref 24–36)

## 2020-03-27 LAB — ETHANOL: Alcohol, Ethyl (B): <10 mg/dL (ref <10)

## 2020-03-27 LAB — I-STAT CHEM 8, ED
BUN: 18 mg/dL (ref 8–23)
Calcium, Ion: 0.97 mmol/L — ABNORMAL LOW (ref 1.15–1.40)
Chloride: 110 mmol/L (ref 98–111)
Creatinine, Ser: 0.7 mg/dL (ref 0.61–1.24)
Glucose, Bld: 74 mg/dL (ref 70–99)
HCT: 39 % (ref 39.0–52.0)
Hemoglobin: 13.3 g/dL (ref 13.0–17.0)
Potassium: 3.1 mmol/L — ABNORMAL LOW (ref 3.5–5.1)
Sodium: 143 mmol/L (ref 135–145)
TCO2: 21 mmol/L — ABNORMAL LOW (ref 22–32)

## 2020-03-27 LAB — POCT I-STAT 7, (LYTES, BLD GAS, ICA,H+H)
Acid-base deficit: 3 mmol/L — ABNORMAL HIGH (ref 0.0–2.0)
Bicarbonate: 24.4 mmol/L (ref 20.0–28.0)
Calcium, Ion: 1.2 mmol/L (ref 1.15–1.40)
HCT: 45 % (ref 39.0–52.0)
Hemoglobin: 15.3 g/dL (ref 13.0–17.0)
O2 Saturation: 99 %
Patient temperature: 98
Potassium: 4.7 mmol/L (ref 3.5–5.1)
Sodium: 139 mmol/L (ref 135–145)
TCO2: 26 mmol/L (ref 22–32)
pCO2 arterial: 49.9 mmHg — ABNORMAL HIGH (ref 32.0–48.0)
pH, Arterial: 7.296 — ABNORMAL LOW (ref 7.350–7.450)
pO2, Arterial: 185 mmHg — ABNORMAL HIGH (ref 83.0–108.0)

## 2020-03-27 LAB — SARS CORONAVIRUS 2 BY RT PCR (HOSPITAL ORDER, PERFORMED IN ~~LOC~~ HOSPITAL LAB): SARS Coronavirus 2: NEGATIVE

## 2020-03-27 LAB — COMPREHENSIVE METABOLIC PANEL
ALT: 12 U/L (ref 0–44)
AST: 19 U/L (ref 15–41)
Albumin: 2.9 g/dL — ABNORMAL LOW (ref 3.5–5.0)
Alkaline Phosphatase: 49 U/L (ref 38–126)
Anion gap: 11 (ref 5–15)
BUN: 15 mg/dL (ref 8–23)
CO2: 17 mmol/L — ABNORMAL LOW (ref 22–32)
Calcium: 7.6 mg/dL — ABNORMAL LOW (ref 8.9–10.3)
Chloride: 112 mmol/L — ABNORMAL HIGH (ref 98–111)
Creatinine, Ser: 0.8 mg/dL (ref 0.61–1.24)
GFR calc Af Amer: >60 mL/min (ref >60)
GFR calc non Af Amer: >60 mL/min (ref >60)
Glucose, Bld: 80 mg/dL (ref 70–99)
Potassium: 3.3 mmol/L — ABNORMAL LOW (ref 3.5–5.1)
Sodium: 140 mmol/L (ref 135–145)
Total Bilirubin: 0.8 mg/dL (ref 0.3–1.2)
Total Protein: 5 g/dL — ABNORMAL LOW (ref 6.5–8.1)

## 2020-03-27 LAB — HEMOGLOBIN A1C
Hgb A1c MFr Bld: 5.7 % — ABNORMAL HIGH (ref 4.8–5.6)
Mean Plasma Glucose: 116.89 mg/dL

## 2020-03-27 LAB — GLUCOSE, CAPILLARY
Glucose-Capillary: 140 mg/dL — ABNORMAL HIGH (ref 70–99)
Glucose-Capillary: 132 mg/dL — ABNORMAL HIGH (ref 70–99)
Glucose-Capillary: 96 mg/dL (ref 70–99)
Glucose-Capillary: 82 mg/dL (ref 70–99)
Glucose-Capillary: 90 mg/dL (ref 70–99)

## 2020-03-27 LAB — CBG MONITORING, ED: Glucose-Capillary: 95 mg/dL (ref 70–99)

## 2020-03-27 LAB — PROTIME-INR
INR: 1 (ref 0.8–1.2)
Prothrombin Time: 12.9 seconds (ref 11.4–15.2)

## 2020-03-27 LAB — HIV ANTIBODY (ROUTINE TESTING W REFLEX): HIV Screen 4th Generation wRfx: NONREACTIVE

## 2020-03-27 SURGERY — IR WITH ANESTHESIA
Anesthesia: General

## 2020-03-27 MED ORDER — CLOPIDOGREL BISULFATE 300 MG PO TABS
ORAL_TABLET | ORAL | Status: AC
  Filled 2020-03-27: qty 1

## 2020-03-27 MED ORDER — PANTOPRAZOLE SODIUM 40 MG IV SOLR
40.0000 mg | Freq: Every day | INTRAVENOUS | Status: DC
  Administered 2020-03-27 – 2020-03-30 (×4): 40 mg via INTRAVENOUS
  Filled 2020-03-27 (×4): qty 40

## 2020-03-27 MED ORDER — LABETALOL HCL 5 MG/ML IV SOLN: 10.0000 mg | Freq: Once | INTRAVENOUS | Status: DC | PRN

## 2020-03-27 MED ORDER — GLYCOPYRROLATE 0.2 MG/ML IJ SOLN
INTRAMUSCULAR | Status: DC | PRN
Start: 2020-03-27 — End: 2020-03-27
  Administered 2020-03-27 (×2): .2 mg via INTRAVENOUS

## 2020-03-27 MED ORDER — ORAL CARE MOUTH RINSE
15.0000 mL | OROMUCOSAL | Status: DC
  Administered 2020-03-27 – 2020-03-28 (×11): 15 mL via OROMUCOSAL

## 2020-03-27 MED ORDER — CLEVIDIPINE BUTYRATE 0.5 MG/ML IV EMUL
0.0000 mg/h | INTRAVENOUS | Status: DC
  Administered 2020-03-27: 2 mg/h via INTRAVENOUS
  Filled 2020-03-27: qty 50

## 2020-03-27 MED ORDER — STROKE: EARLY STAGES OF RECOVERY BOOK
Freq: Once | Status: AC
  Filled 2020-03-27: qty 1

## 2020-03-27 MED ORDER — ACETAMINOPHEN 160 MG/5ML PO SOLN: 650.0000 mg | ORAL | Status: DC | PRN

## 2020-03-27 MED ORDER — ACETAMINOPHEN 325 MG PO TABS
650.0000 mg | ORAL_TABLET | ORAL | Status: DC | PRN
  Administered 2020-03-28 – 2020-04-01 (×4): 650 mg via ORAL
  Filled 2020-03-27 (×3): qty 2

## 2020-03-27 MED ORDER — CEFAZOLIN SODIUM-DEXTROSE 2-3 GM-%(50ML) IV SOLR
INTRAVENOUS | Status: DC | PRN
Start: 2020-03-27 — End: 2020-03-27
  Administered 2020-03-27: 2 g via INTRAVENOUS

## 2020-03-27 MED ORDER — PHENYLEPHRINE 40 MCG/ML (10ML) SYRINGE FOR IV PUSH (FOR BLOOD PRESSURE SUPPORT)
PREFILLED_SYRINGE | INTRAVENOUS | Status: DC | PRN
  Administered 2020-03-27: 80 ug via INTRAVENOUS
  Administered 2020-03-27: 300 ug via INTRAVENOUS

## 2020-03-27 MED ORDER — CHLORHEXIDINE GLUCONATE CLOTH 2 % EX PADS
6.0000 | MEDICATED_PAD | Freq: Every day | CUTANEOUS | Status: DC
  Administered 2020-03-27 – 2020-03-30 (×4): 6 via TOPICAL

## 2020-03-27 MED ORDER — SODIUM CHLORIDE 0.9 % IV BOLUS
1000.0000 mL | Freq: Once | INTRAVENOUS | Status: AC
  Administered 2020-03-27: 1000 mL via INTRAVENOUS

## 2020-03-27 MED ORDER — SODIUM CHLORIDE 0.9 % IV SOLN: INTRAVENOUS | Status: DC

## 2020-03-27 MED ORDER — IPRATROPIUM-ALBUTEROL 0.5-2.5 (3) MG/3ML IN SOLN
3.0000 mL | Freq: Four times a day (QID) | RESPIRATORY_TRACT | Status: DC
  Administered 2020-03-27 – 2020-03-29 (×8): 3 mL via RESPIRATORY_TRACT
  Filled 2020-03-27 (×10): qty 3

## 2020-03-27 MED ORDER — ALTEPLASE (STROKE) FULL DOSE INFUSION
0.9000 mg/kg | Freq: Once | INTRAVENOUS | Status: AC
  Administered 2020-03-27: 71 mg via INTRAVENOUS
  Filled 2020-03-27: qty 100

## 2020-03-27 MED ORDER — CLEVIDIPINE BUTYRATE 0.5 MG/ML IV EMUL
INTRAVENOUS | Status: DC | PRN
Start: 2020-03-27 — End: 2020-03-27
  Administered 2020-03-27: 2 mg/h via INTRAVENOUS

## 2020-03-27 MED ORDER — CEFAZOLIN SODIUM-DEXTROSE 2-3 GM-%(50ML) IV SOLR
INTRAVENOUS | Status: DC | PRN
  Administered 2020-03-27: 2 g via INTRAVENOUS

## 2020-03-27 MED ORDER — IOHEXOL 350 MG/ML SOLN
80.0000 mL | Freq: Once | INTRAVENOUS | Status: AC | PRN
  Administered 2020-03-27: 80 mL via INTRAVENOUS

## 2020-03-27 MED ORDER — ROCURONIUM BROMIDE 10 MG/ML (PF) SYRINGE
PREFILLED_SYRINGE | INTRAVENOUS | Status: DC | PRN
  Administered 2020-03-27: 50 mg via INTRAVENOUS
  Administered 2020-03-27: 30 mg via INTRAVENOUS
  Administered 2020-03-27: 50 mg via INTRAVENOUS
  Administered 2020-03-27: 30 mg via INTRAVENOUS
  Administered 2020-03-27: 20 mg via INTRAVENOUS

## 2020-03-27 MED ORDER — ACETAMINOPHEN 650 MG RE SUPP
650.0000 mg | RECTAL | Status: DC | PRN
  Filled 2020-03-27: qty 1

## 2020-03-27 MED ORDER — NICARDIPINE HCL IN NACL 20-0.86 MG/200ML-% IV SOLN: 0.0000 mg/h | INTRAVENOUS | Status: DC | PRN

## 2020-03-27 MED ORDER — ASPIRIN 81 MG PO CHEW
CHEWABLE_TABLET | ORAL | Status: AC
  Administered 2020-03-27: 81 mg via OROGASTRIC
  Filled 2020-03-27: qty 1

## 2020-03-27 MED ORDER — EPTIFIBATIDE 20 MG/10ML IV SOLN
INTRAVENOUS | Status: AC
  Filled 2020-03-27: qty 10

## 2020-03-27 MED ORDER — SODIUM CHLORIDE 0.9 % IV SOLN: 250.0000 mL | INTRAVENOUS | Status: DC

## 2020-03-27 MED ORDER — FENTANYL CITRATE (PF) 100 MCG/2ML IJ SOLN: 25.0000 ug | INTRAMUSCULAR | Status: DC | PRN

## 2020-03-27 MED ORDER — CLEVIDIPINE BUTYRATE 0.5 MG/ML IV EMUL
INTRAVENOUS | Status: AC
  Filled 2020-03-27: qty 50

## 2020-03-27 MED ORDER — IOHEXOL 240 MG/ML SOLN
150.0000 mL | Freq: Once | INTRAMUSCULAR | Status: AC | PRN
  Administered 2020-03-27: 71 mL via INTRA_ARTERIAL

## 2020-03-27 MED ORDER — LIDOCAINE 2% (20 MG/ML) 5 ML SYRINGE
INTRAMUSCULAR | Status: DC | PRN
  Administered 2020-03-27: 100 mg via INTRAVENOUS

## 2020-03-27 MED ORDER — CEFAZOLIN SODIUM-DEXTROSE 2-3 GM-%(50ML) IV SOLR: INTRAVENOUS | Status: DC | PRN

## 2020-03-27 MED ORDER — FENTANYL CITRATE (PF) 100 MCG/2ML IJ SOLN
INTRAMUSCULAR | Status: AC
  Administered 2020-03-27: 100 ug via INTRAVENOUS
  Filled 2020-03-27: qty 2

## 2020-03-27 MED ORDER — PHENYLEPHRINE HCL-NACL 10-0.9 MG/250ML-% IV SOLN
INTRAVENOUS | Status: AC
  Filled 2020-03-27: qty 250

## 2020-03-27 MED ORDER — PHENYLEPHRINE HCL-NACL 10-0.9 MG/250ML-% IV SOLN
INTRAVENOUS | Status: DC | PRN
  Administered 2020-03-27: 25 ug/min via INTRAVENOUS

## 2020-03-27 MED ORDER — ESMOLOL HCL 100 MG/10ML IV SOLN
INTRAVENOUS | Status: DC | PRN
Start: 2020-03-27 — End: 2020-03-27
  Administered 2020-03-27: 20 mg via INTRAVENOUS

## 2020-03-27 MED ORDER — SODIUM CHLORIDE 0.9 % IV SOLN: 50.0000 mL | Freq: Once | INTRAVENOUS | Status: DC

## 2020-03-27 MED ORDER — DOCUSATE SODIUM 50 MG/5ML PO LIQD
100.0000 mg | Freq: Two times a day (BID) | ORAL | Status: DC
  Administered 2020-03-27 – 2020-03-30 (×3): 100 mg
  Filled 2020-03-27 (×4): qty 10

## 2020-03-27 MED ORDER — POTASSIUM CHLORIDE 10 MEQ/100ML IV SOLN
10.0000 meq | INTRAVENOUS | Status: AC
  Administered 2020-03-27 (×3): 10 meq via INTRAVENOUS
  Filled 2020-03-27 (×3): qty 100

## 2020-03-27 MED ORDER — PHENYLEPHRINE HCL-NACL 10-0.9 MG/250ML-% IV SOLN
25.0000 ug/min | INTRAVENOUS | Status: DC
  Administered 2020-03-27: 65 ug/min via INTRAVENOUS
  Administered 2020-03-27: 60 ug/min via INTRAVENOUS
  Administered 2020-03-27: 100 ug/min via INTRAVENOUS
  Administered 2020-03-27: 75 ug/min via INTRAVENOUS
  Administered 2020-03-27: 70 ug/min via INTRAVENOUS
  Administered 2020-03-27 – 2020-03-28 (×2): 25 ug/min via INTRAVENOUS
  Administered 2020-03-28: 55 ug/min via INTRAVENOUS
  Administered 2020-03-28: 45 ug/min via INTRAVENOUS
  Filled 2020-03-27: qty 500
  Filled 2020-03-27: qty 250
  Filled 2020-03-27: qty 500
  Filled 2020-03-27: qty 250
  Filled 2020-03-27 (×2): qty 500
  Filled 2020-03-27: qty 250

## 2020-03-27 MED ORDER — IOHEXOL 300 MG/ML  SOLN
50.0000 mL | Freq: Once | INTRAMUSCULAR | Status: AC | PRN
  Administered 2020-03-27: 50 mL via INTRA_ARTERIAL

## 2020-03-27 MED ORDER — POLYETHYLENE GLYCOL 3350 17 G PO PACK: 17.0000 g | PACK | Freq: Every day | ORAL | Status: DC

## 2020-03-27 MED ORDER — FENTANYL CITRATE (PF) 100 MCG/2ML IJ SOLN
INTRAMUSCULAR | Status: DC | PRN
  Administered 2020-03-27: 100 ug via INTRAVENOUS

## 2020-03-27 MED ORDER — EPHEDRINE SULFATE-NACL 50-0.9 MG/10ML-% IV SOSY
PREFILLED_SYRINGE | INTRAVENOUS | Status: DC | PRN
  Administered 2020-03-27: 5 mg via INTRAVENOUS

## 2020-03-27 MED ORDER — ATROPINE SULFATE 0.4 MG/ML IJ SOLN
INTRAMUSCULAR | Status: DC | PRN
Start: 2020-03-27 — End: 2020-03-27
  Administered 2020-03-27: .4 mg via INTRAVENOUS

## 2020-03-27 MED ORDER — ACETAMINOPHEN 325 MG PO TABS: 650.0000 mg | ORAL_TABLET | ORAL | Status: DC | PRN

## 2020-03-27 MED ORDER — CEFAZOLIN SODIUM-DEXTROSE 2-4 GM/100ML-% IV SOLN
INTRAVENOUS | Status: AC
  Filled 2020-03-27: qty 100

## 2020-03-27 MED ORDER — SODIUM CHLORIDE 0.9 % IV SOLN
INTRAVENOUS | Status: DC | PRN
Start: 2020-03-27 — End: 2020-03-27

## 2020-03-27 MED ORDER — PHENYLEPHRINE HCL-NACL 10-0.9 MG/250ML-% IV SOLN: 0.0000 ug/min | INTRAVENOUS | Status: DC

## 2020-03-27 MED ORDER — FENTANYL CITRATE (PF) 100 MCG/2ML IJ SOLN
25.0000 ug | INTRAMUSCULAR | Status: DC | PRN
  Administered 2020-03-27 – 2020-03-28 (×5): 100 ug via INTRAVENOUS
  Filled 2020-03-27 (×7): qty 2

## 2020-03-27 MED ORDER — SUCCINYLCHOLINE CHLORIDE 20 MG/ML IJ SOLN
INTRAMUSCULAR | Status: DC | PRN
Start: 2020-03-27 — End: 2020-03-27
  Administered 2020-03-27: 120 mg via INTRAVENOUS

## 2020-03-27 MED ORDER — NITROGLYCERIN 1 MG/10 ML FOR IR/CATH LAB
INTRA_ARTERIAL | Status: AC
  Administered 2020-03-27 (×2): 25 ug via INTRA_ARTERIAL
  Filled 2020-03-27: qty 10

## 2020-03-27 MED ORDER — TICAGRELOR 90 MG PO TABS
ORAL_TABLET | ORAL | Status: AC
  Administered 2020-03-27: 180 mg via OROGASTRIC
  Filled 2020-03-27: qty 2

## 2020-03-27 MED ORDER — INSULIN ASPART 100 UNIT/ML ~~LOC~~ SOLN
0.0000 [IU] | SUBCUTANEOUS | Status: DC
  Administered 2020-03-27 (×2): 2 [IU] via SUBCUTANEOUS

## 2020-03-27 MED ORDER — VERAPAMIL HCL 2.5 MG/ML IV SOLN
INTRAVENOUS | Status: AC
  Filled 2020-03-27: qty 2

## 2020-03-27 MED ORDER — PHENYLEPHRINE HCL-NACL 10-0.9 MG/250ML-% IV SOLN
INTRAVENOUS | Status: DC | PRN
Start: 2020-03-27 — End: 2020-03-27
  Administered 2020-03-27: 20 ug/min via INTRAVENOUS

## 2020-03-27 MED ORDER — IOHEXOL 240 MG/ML SOLN
INTRAMUSCULAR | Status: AC
  Filled 2020-03-27: qty 200

## 2020-03-27 MED ORDER — PROPOFOL 10 MG/ML IV BOLUS
INTRAVENOUS | Status: DC | PRN
  Administered 2020-03-27: 200 mg via INTRAVENOUS

## 2020-03-27 MED ORDER — PROPOFOL 1000 MG/100ML IV EMUL
0.0000 ug/kg/min | INTRAVENOUS | Status: DC
  Administered 2020-03-27: 50 ug/kg/min via INTRAVENOUS
  Administered 2020-03-27: 10 ug/kg/min via INTRAVENOUS
  Administered 2020-03-27: 35 ug/kg/min via INTRAVENOUS
  Administered 2020-03-27: 50 ug/kg/min via INTRAVENOUS
  Administered 2020-03-28 (×2): 35 ug/kg/min via INTRAVENOUS
  Filled 2020-03-27 (×3): qty 100
  Filled 2020-03-27: qty 200

## 2020-03-27 MED ORDER — SODIUM CHLORIDE 0.9 % IV SOLN
250.0000 mL | INTRAVENOUS | Status: DC
  Administered 2020-03-27: 250 mL via INTRAVENOUS

## 2020-03-27 MED ORDER — ACETAMINOPHEN 650 MG RE SUPP: 650.0000 mg | RECTAL | Status: DC | PRN

## 2020-03-27 MED ORDER — IOHEXOL 300 MG/ML  SOLN
150.0000 mL | Freq: Once | INTRAMUSCULAR | Status: AC | PRN
  Administered 2020-03-27: 71 mL via INTRA_ARTERIAL

## 2020-03-27 MED ORDER — CHLORHEXIDINE GLUCONATE 0.12% ORAL RINSE (MEDLINE KIT)
15.0000 mL | Freq: Two times a day (BID) | OROMUCOSAL | Status: DC
  Administered 2020-03-27 – 2020-03-28 (×3): 15 mL via OROMUCOSAL

## 2020-03-27 MED ORDER — SODIUM CHLORIDE 0.9 % IV SOLN: 50.0000 mL/h | INTRAVENOUS | Status: DC

## 2020-03-27 MED ORDER — TIROFIBAN HCL IN NACL 5-0.9 MG/100ML-% IV SOLN
INTRAVENOUS | Status: AC
  Filled 2020-03-27: qty 100

## 2020-03-27 NOTE — Progress Notes (Signed)
Patient transported to MRI and back without complications. RN at bedside. 

## 2020-03-27 NOTE — Progress Notes (Signed)
PT Cancellation Note  Patient Details Name: Ethan Fitzgerald MRN: [id number] DOB: Mar 27, 1956   Cancelled Treatment:    Reason Eval/Treat Not Completed: Patient not medically ready; patient on vent and bedrest.  Will attempt another day.   [name] 03/27/2020, 9:26 AM  Magda Kiel, PT Acute Rehabilitation Services TCCEQ:[phone] Office:[phone] 03/27/2020

## 2020-03-27 NOTE — ED Notes (Signed)
Activated code stroke by [place]

## 2020-03-27 NOTE — Anesthesia Procedure Notes (Addendum)
Procedure Name: Intubation Date/Time: 03/27/2020 3:01 AM Performed by: Clovis Cao, CRNA Pre-anesthesia Checklist: Patient identified, Emergency Drugs available, Suction available, Patient being monitored and Timeout performed Patient Re-evaluated:Patient Re-evaluated prior to induction Oxygen Delivery Method: Ambu bag Preoxygenation: Pre-oxygenation with 100% oxygen Induction Type: IV induction, Rapid sequence and Cricoid Pressure applied Laryngoscope Size: Glidescope and 4 Grade View: Grade I Tube type: Oral Tube size: 8.0 mm Number of attempts: 1 Airway Equipment and Method: Stylet and Video-laryngoscopy Placement Confirmation: ETT inserted through vocal cords under direct vision,  breath sounds checked- equal and bilateral and CO2 detector Secured at: 22 cm Tube secured with: Tape Dental Injury: Teeth and Oropharynx as per pre-operative assessment

## 2020-03-27 NOTE — Anesthesia Preprocedure Evaluation (Addendum)
Anesthesia Evaluation  Patient identified by MRN, date of birth, ID band Patient confused    Reviewed: Allergy & Precautions, H&P , NPO status , Patient's Chart, lab work & pertinent test results  Airway Mallampati: I  TM Distance: >3 FB Neck ROM: Full    Dental no notable dental hx. (+) Edentulous Upper, Edentulous Lower, Dental Advisory Given   Pulmonary COPD,  COPD inhaler, former smoker,    Pulmonary exam normal breath sounds clear to auscultation       Cardiovascular negative cardio ROS   Rhythm:Regular Rate:Normal     Neuro/Psych  Headaches, CVA, Residual Symptoms negative psych ROS   GI/Hepatic negative GI ROS, Neg liver ROS,   Endo/Other  negative endocrine ROS  Renal/GU negative Renal ROS  negative genitourinary   Musculoskeletal   Abdominal   Peds  Hematology negative hematology ROS (+)   Anesthesia Other Findings   Reproductive/Obstetrics negative OB ROS                            Anesthesia Physical Anesthesia Plan  ASA: III and emergent  Anesthesia Plan: General   Post-op Pain Management:    Induction: Intravenous, Rapid sequence and Cricoid pressure planned  PONV Risk Score and Plan: 2 and Ondansetron and Treatment may vary due to age or medical condition  Airway Management Planned: Oral ETT  Additional Equipment: Arterial line  Intra-op Plan:   Post-operative Plan: Extubation in OR and Possible Post-op intubation/ventilation  Informed Consent: I have reviewed the patients History and Physical, chart, labs and discussed the procedure including the risks, benefits and alternatives for the proposed anesthesia with the patient or authorized representative who has indicated his/her understanding and acceptance.     Dental advisory given  Plan Discussed with: CRNA  Anesthesia Plan Comments:         Anesthesia Quick Evaluation

## 2020-03-27 NOTE — Code Documentation (Signed)
Responded to Code Stroke called at 0130 on pt already in ED for L sided weakness, [id number].  NIH-9, CBG-97, CT head negative for acute changes, CTA-+LVO B ICAs.  TPA started at 0151. IR team paged out at [facility]. Pt prepped and transported to [facility] at 0200.

## 2020-03-27 NOTE — Progress Notes (Signed)
STROKE TEAM PROGRESS NOTE   INTERVAL HISTORY His wife is at the bedside. She recounted HPI.  I personally reviewed history of presenting illness, imaging films and electronic medical records.  He presented with left hemiplegia due to right carotid occlusion with right MCA embolism and underwent thrombectomy with partial recanalization of the right MCA unsuccessful revascularization of the right ICA which reoccluded.  He remains intubated.  Blood pressure adequately controlled.  He is arousable and follows commands but continues to have left hemiparesis.  Vitals:   03/27/20 0830 03/27/20 0835 03/27/20 0841 03/27/20 0845  BP: (!) 162/100 (!) 165/98 (!) 128/93 140/86  Pulse: 88 97 100 (!) 103  Resp: 19 20 19  (!) 22  Temp:      TempSrc:      SpO2: 100% 100% 100% 100%  Weight:      Height:       CBC:  Recent Labs  Lab 03/27/20 0130 03/27/20 0143 03/27/20 0823 03/27/20 0832  WBC 9.0  --  16.3*  --   NEUTROABS 6.0  --   --   --   HGB 13.2   < > 15.9 15.3  HCT 39.9   < > 47.0 45.0  MCV 98.5  --  98.5  --   PLT 245  --  269  --    < > = values in this interval not displayed.   Basic Metabolic Panel:  Recent Labs  Lab 03/27/20 0130 03/27/20 0130 03/27/20 0143 03/27/20 0832  NA 140   < > 143 139  K 3.3*   < > 3.1* 4.7  CL 112*  --  110  --   CO2 17*  --   --   --   GLUCOSE 80  --  74  --   BUN 15  --  18  --   CREATININE 0.80  --  0.70  --   CALCIUM 7.6*  --   --   --    < > = values in this interval not displayed.   Lipid Panel:  Recent Labs  Lab 03/27/20 0823  CHOL 153  TRIG 115  HDL 48  CHOLHDL 3.2  VLDL 23  LDLCALC 82   HgbA1c:  Recent Labs  Lab 03/27/20 0823  HGBA1C 5.7*   Urine Drug Screen: No results for input(s): LABOPIA, COCAINSCRNUR, LABBENZ, AMPHETMU, THCU, LABBARB in the last 168 hours.  Alcohol Level  Recent Labs  Lab 03/27/20 0130  ETH <10    IMAGING past 24 hours CT Code Stroke CTA Head W/WO contrast  Result Date: 03/27/2020 CLINICAL  DATA:  Initial evaluation for acute left-sided weakness. EXAM: CT ANGIOGRAPHY HEAD AND NECK TECHNIQUE: Multidetector CT imaging of the head and neck was performed using the standard protocol during bolus administration of intravenous contrast. Multiplanar CT image reconstructions and MIPs were obtained to evaluate the vascular anatomy. Carotid stenosis measurements (when applicable) are obtained utilizing NASCET criteria, using the distal internal carotid diameter as the denominator. CONTRAST:  57mL OMNIPAQUE IOHEXOL 350 MG/ML SOLN COMPARISON:  Prior head CT from earlier same day. FINDINGS: CTA NECK FINDINGS Aortic arch: Visualized aortic arch of normal caliber with normal 3 vessel morphology. Atheromatous change at the origin of the left subclavian artery without flow-limiting stenosis. Right carotid system: Right common carotid artery patent from its origin to the bifurcation without stenosis. There is abrupt occlusion of the right ICA at its origin at the right carotid bifurcation. Right ICA remains occluded within the neck. Right external carotid  artery and its branches remain patent and well perfused. Left carotid system: Left common carotid artery patent from its origin to the bifurcation without stenosis. Atheromatous change at the left bifurcation with associated abrupt occlusion of the left ICA at its origin. Left ICA remains occluded within the neck. Left external carotid artery and its branches remain well perfused and patent. Vertebral arteries: Both vertebral arteries arise from the subclavian arteries. Right vertebral artery dominant. Vertebral arteries widely patent within the neck without stenosis, dissection, or occlusion. Skeleton: No definite acute osseous abnormality identified. No discrete lytic or blastic osseous lesions. Patient is edentulous. Other neck: No other acute soft tissue abnormality within the neck. No mass lesion or adenopathy. Upper chest: Visualized upper chest demonstrates no  acute finding. Moderate emphysematous changes noted. Review of the MIP images confirms the above findings CTA HEAD FINDINGS Anterior circulation: Both internal carotid arteries occluded at the skull base. Distal reconstitution at the cavernous segments bilaterally likely via of collateral flow from the posterior circulation or possibly external carotid branches. Scattered atheromatous change within the carotid siphons bilaterally. No high-grade stenosis on the right. On the left, there is a focal severe stenosis involving the cavernous left ICA (series 6, image 112). ICA termini patent bilaterally. A1 segments patent. Normal anterior communicating artery complex. Both anterior cerebral arteries remain patent and well perfused. Occlusive thrombus seen within the proximal left M1 segment, corresponding with hyperdense M1 seen on prior CT (series 6, image 98). Small anterior temporal branch appears to remain patent. Additional scattered flow seen within right MCA branches distally, likely collateral nature, overall moderately robust in appearance. Left M1 widely patent. Normal left MCA bifurcation. Distal left MCA branches well perfused and patent. Posterior circulation: Both vertebral arteries are widely patent to the vertebrobasilar junction. Neither PICA well visualized. Basilar mildly tortuous but widely patent to its distal aspect without stenosis. Superior cerebral arteries patent bilaterally. Both PCAs primarily supplied via the basilar and are well perfused to their distal aspects. Probable small bilateral posterior communicating arteries noted. Venous sinuses: Grossly patent allowing for timing the contrast bolus. Anatomic variants: Dominant right vertebral artery. No intracranial aneurysm. Review of the MIP images confirms the above findings IMPRESSION: 1. Positive CTA for LVO, with abrupt occlusion of the right ICA at its origin at the right bifurcation. Distal reconstitution at the cavernous segment with  subsequent reocclusion at the proximal right M1 segment. Moderately robust collateralization evident within the right MCA territory. 2. Abrupt occlusion of the left ICA at its origin at the left bifurcation, age indeterminate. Distal reconstitution at the cavernous segment with good perfusion of the left MCA and both ACAs. Superimposed short-segment severe cavernous left ICA stenosis as above. 3. Wide patency of the posterior circulation. Right vertebral artery dominant. 4.  Emphysema (ICD10-J43.9). These results were communicated to Dr. Leonel Ramsay at [place] 7/22/2021by text page via the [facility] messaging system. Electronically Signed   By: Jeannine Boga M.D.   On: 03/27/2020 02:20   CT Code Stroke CTA Neck W/WO contrast  Result Date: 03/27/2020 CLINICAL DATA:  Initial evaluation for acute left-sided weakness. EXAM: CT ANGIOGRAPHY HEAD AND NECK TECHNIQUE: Multidetector CT imaging of the head and neck was performed using the standard protocol during bolus administration of intravenous contrast. Multiplanar CT image reconstructions and MIPs were obtained to evaluate the vascular anatomy. Carotid stenosis measurements (when applicable) are obtained utilizing NASCET criteria, using the distal internal carotid diameter as the denominator. CONTRAST:  66mL OMNIPAQUE IOHEXOL 350 MG/ML SOLN COMPARISON:  Prior head CT from earlier same day. FINDINGS: CTA NECK FINDINGS Aortic arch: Visualized aortic arch of normal caliber with normal 3 vessel morphology. Atheromatous change at the origin of the left subclavian artery without flow-limiting stenosis. Right carotid system: Right common carotid artery patent from its origin to the bifurcation without stenosis. There is abrupt occlusion of the right ICA at its origin at the right carotid bifurcation. Right ICA remains occluded within the neck. Right external carotid artery and its branches remain patent and well perfused. Left carotid system: Left common carotid artery  patent from its origin to the bifurcation without stenosis. Atheromatous change at the left bifurcation with associated abrupt occlusion of the left ICA at its origin. Left ICA remains occluded within the neck. Left external carotid artery and its branches remain well perfused and patent. Vertebral arteries: Both vertebral arteries arise from the subclavian arteries. Right vertebral artery dominant. Vertebral arteries widely patent within the neck without stenosis, dissection, or occlusion. Skeleton: No definite acute osseous abnormality identified. No discrete lytic or blastic osseous lesions. Patient is edentulous. Other neck: No other acute soft tissue abnormality within the neck. No mass lesion or adenopathy. Upper chest: Visualized upper chest demonstrates no acute finding. Moderate emphysematous changes noted. Review of the MIP images confirms the above findings CTA HEAD FINDINGS Anterior circulation: Both internal carotid arteries occluded at the skull base. Distal reconstitution at the cavernous segments bilaterally likely via of collateral flow from the posterior circulation or possibly external carotid branches. Scattered atheromatous change within the carotid siphons bilaterally. No high-grade stenosis on the right. On the left, there is a focal severe stenosis involving the cavernous left ICA (series 6, image 112). ICA termini patent bilaterally. A1 segments patent. Normal anterior communicating artery complex. Both anterior cerebral arteries remain patent and well perfused. Occlusive thrombus seen within the proximal left M1 segment, corresponding with hyperdense M1 seen on prior CT (series 6, image 98). Small anterior temporal branch appears to remain patent. Additional scattered flow seen within right MCA branches distally, likely collateral nature, overall moderately robust in appearance. Left M1 widely patent. Normal left MCA bifurcation. Distal left MCA branches well perfused and patent. Posterior  circulation: Both vertebral arteries are widely patent to the vertebrobasilar junction. Neither PICA well visualized. Basilar mildly tortuous but widely patent to its distal aspect without stenosis. Superior cerebral arteries patent bilaterally. Both PCAs primarily supplied via the basilar and are well perfused to their distal aspects. Probable small bilateral posterior communicating arteries noted. Venous sinuses: Grossly patent allowing for timing the contrast bolus. Anatomic variants: Dominant right vertebral artery. No intracranial aneurysm. Review of the MIP images confirms the above findings IMPRESSION: 1. Positive CTA for LVO, with abrupt occlusion of the right ICA at its origin at the right bifurcation. Distal reconstitution at the cavernous segment with subsequent reocclusion at the proximal right M1 segment. Moderately robust collateralization evident within the right MCA territory. 2. Abrupt occlusion of the left ICA at its origin at the left bifurcation, age indeterminate. Distal reconstitution at the cavernous segment with good perfusion of the left MCA and both ACAs. Superimposed short-segment severe cavernous left ICA stenosis as above. 3. Wide patency of the posterior circulation. Right vertebral artery dominant. 4.  Emphysema (ICD10-J43.9). These results were communicated to Dr. Leonel Ramsay at [place] 7/22/2021by text page via the [facility] messaging system. Electronically Signed   By: Jeannine Boga M.D.   On: 03/27/2020 02:20   Portable Chest x-ray  Result Date: 03/27/2020 CLINICAL DATA:  Intubation. EXAM: PORTABLE CHEST 1 VIEW COMPARISON:  12/04/2019. FINDINGS: Endotracheal tube noted with its tip 5 cm above the carina. NG tube noted with its tip coiled stomach. Heart size normal. Low lung volumes with bibasilar. Right costophrenic angle incompletely imaged. No pleural effusion noted. No pneumothorax. IMPRESSION: 1. Endotracheal tube noted with tip 5 cm above the carina. NG tube noted  with tip coiled stomach. 2.  Low lung volumes with bibasilar atelectasis. Electronically Signed   By: Marcello Moores  Register   On: 03/27/2020 07:20   CT HEAD CODE STROKE WO CONTRAST  Result Date: 03/27/2020 CLINICAL DATA:  Code stroke. Initial evaluation for acute left-sided weakness. EXAM: CT HEAD WITHOUT CONTRAST TECHNIQUE: Contiguous axial images were obtained from the base of the skull through the vertex without intravenous contrast. COMPARISON:  None available. FINDINGS: Brain: Generalized age-related cerebral atrophy. No acute intracranial hemorrhage. There is subtle asymmetric hypodensity involving the right caudate and lentiform nuclei, suspicious for early developing right MCA territory ischemia. Gray-white matter differentiation otherwise grossly maintained elsewhere within the brain. No mass lesion, midline shift or mass effect. No hydrocephalus or extra-axial fluid collection. Vascular: Dense right M1 segment concerning for thrombus and LVO. Skull: Scalp soft tissues within normal limits.  Calvarium intact. Sinuses/Orbits: Globes and orbital soft tissues within normal limits. Chronic mucoperiosteal thickening noted within the ethmoidal air cells. Paranasal sinuses are otherwise clear. No mastoid effusion. Other: None. ASPECTS [facility] Stroke Program Early CT Score) - Ganglionic level infarction (caudate, lentiform nuclei, internal capsule, insula, M1-M3 cortex): 5 - Supraganglionic infarction (M4-M6 cortex): 3 Total score (0-10 with 10 being normal): 8 IMPRESSION: 1. Dense right M1 segment, concerning for thrombus/LVO. Subtle asymmetric hypodensity involving the right caudate and lentiform nuclei concerning for early ischemic changes. No intracranial hemorrhage. 2. ASPECTS is 8. These results were communicated to Dr. Leonel Ramsay at [place] 7/22/2021by text page via the [facility] messaging system. Electronically Signed   By: Jeannine Boga M.D.   On: 03/27/2020 01:51    PHYSICAL EXAM Middle-aged  Caucasian male who is intubated not sedated. . Afebrile. Head is nontraumatic. Neck is supple without bruit.    Cardiac exam no murmur or gallop. Lungs are clear to auscultation. Distal pulses are well felt. Neurological Exam :  Patient is intubated.  Not on sedation.  Awake.  Right gaze preference.  Able to look to the left past midline.  Blinks to threat on the right but not the left.  Follows simple midline and commands on the right.  Purposeful antigravity movements on the right.  Left hemiparesis but able to have trace withdrawal in the left upper and lower extremity to noxious stimuli.  Tone is diminished on the left compared to the right.  Both plantars are downgoing.  Gait not tested. ASSESSMENT/PLAN Ethan Fitzgerald is a 65 y.o. male with history of COPD presenting with L sided weakness. Received tPA 03/27/2020 at 0151.   Stroke:   R MCA infarct s/p tPA and IR w/ partial revascularization, most likely secondary to large vessel disease  ?  Dissection due to recent coughing episodes   code Stroke CT head dense R M1. Subtle asymmetric hypodensity R caudate and lentiform nucleus. ASPECTS 8    CTA head & neck R ICA LVO at bifurcation origin w/ cavernous reconstitution and reocclusion proximal R M1. Good MCA collaterals. L ICA occlusion at bifurcation origin. Short severe L ICA cavernous stenosis. Emphysema.   Cerebral angio partial revascularization R MCA dominant division after 4 passes w/ TICI2a/TICI2b. S/p  angioplasty acute R ICA occlusion w/ reocclusion     Post IR CT mild to mod RT peri-insular and post frontal convexity contrast stain vs SAH   MRI  pending   2D Echo pending   LDL 82   HgbA1c 5.6  VTE prophylaxis - SCDs   No antithrombotic prior to admission, received aspirin 81 and brilinta in IR, now on No antithrombotic as within 24h of tPA administration and stent occlusion.    Therapy recommendations:  pending   Disposition:  pending   Acute Hypoxemic Respiratory  Failure Secondary to stroke  Intubated for IR  Hope for extubation today  CCM on board  Carotid stenosis   Chronic L ICA occlusion  Acute R ICA occlusion S/p R ICA angioplasty w/ reocclusion    Blood Pressure Hypotension  Home meds:  None, no hx Hypertension . BP goal per IR x 24h following IR procedure and tPA administration.  . BP on the low side  . Add neo for IR goal 140-160 x 24h given ICA occlusions . Long-term BP goal normotensive  Hyperlipidemia  Home meds:  crestor 10 and fish oil  LDL 82, goal < 70  Resume home meds once PO access  Continue statin at discharge  Dysphagia . Secondary to stroke . NPO . Speech on board   Other Stroke Risk Factors  Advanced age  Former Cigarette smoker, quit 2 yrs ago  Former smokeless tobacco user  ETOH use, advised to drink no more than 2 drink(s) a day  Other Active Problems  COPD w/o acute exacerbation   Hypokalemia  Leukocytosis, WBC 16.3  Hypokalemia 3.3->3.1->4.7 - resolved  Hospital day # 0  He presented with left hemiplegia secondary to right ICA occlusion presented outside time window for thrombolysis but underwent partial mechanical thrombectomy with revascularization of the right MCA but unsuccessful revascularization of the right ICA which remains occluded.  Recommend close neurological monitoring and strict blood pressure control.  Permissive hypertension with systolic blood pressure 161-096 range.  May use IV fluids or pressors to support this blood pressure.  Check MRI scan of the brain later this afternoon and consider extubation.  Long discussion at the bedside with the patient's wife and with Dr. Tacy Learn critical care medicine about plan of care and answered questions.This patient is critically ill and at significant risk of neurological worsening, death and care requires constant monitoring of vital signs, hemodynamics,respiratory and cardiac monitoring, extensive review of multiple databases,  frequent neurological assessment, discussion with family, other specialists and medical decision making of high complexity.I have made any additions or clarifications directly to the above note.This critical care time does not reflect procedure time, or teaching time or supervisory time of PA/NP/Med Resident etc but could involve care discussion time.  I spent 40 minutes of neurocritical care time  in the care of  this patient. Antony Contras, MD     To contact Stroke Continuity provider, please refer to [email or web address] After hours, contact General Neurology

## 2020-03-27 NOTE — Consult Note (Signed)
NAME:  Ethan Fitzgerald, MRN:  [id number], DOB:  Jun 13, 1956, LOS: 0 ADMISSION DATE:  03/27/2020, CONSULTATION DATE:  7/22 REFERRING MD:  Dr. Leonel Ramsay , CHIEF COMPLAINT:  CVA   Brief History   64 year old male admitted with R MCA CVA and taken to IR for revascularization. Post-procedure he remained on vent and transferred to ICU for recovery.   History of present illness   64 year old male with past medical history as below, which is significant for COPD and tobacco use.  He was in his usual state of health until around 11 PM when he went to sleep.  When he woke up around 12:30 AM he noticed left-sided weakness and his wife was unable to understand his speech prompting EMS call.  Upon EMS arrival left-sided facial droop and left-sided weakness were noted.  Immediately upon arrival to the emergency department he went for CT of the head which showed a dense right MCA.  CT angiogram demonstrated bilateral carotid occlusions and acute right MCA infarct.  The patient was deemed a candidate for TPA which was initiated in the emergency department.  He was taken to IR suite for mechanical thrombectomy of the right MCA lesion.  Also underwent balloon angioplasty of proximal right ICA with reocclusion.  Post procedurally he was transferred to the ICU on the ventilator and PCCM was consulted for ventilator management.  Past Medical History   has a past medical history of COPD (chronic obstructive pulmonary disease) ([place]), Erectile dysfunction, Headache, Knee pain, left, Seasonal allergies, and Tobacco dependence.   Significant Hospital Events   7/22 admit with CVA. TPA, IR for mechanical thrombectomy.   Consults:  IR PCCM  Procedures:  ETT 7/22 >  Significant Diagnostic Tests:  CT head 7/22 > Dense right M1 segment, concerning for thrombus/LVO. No intracranial Hemorrhage. CTA head /neck 7/22 > Positive CTA for LVO, with abrupt occlusion of the right ICA at its origin at the right bifurcation. Distal  reconstitution at the cavernous segment with subsequent reocclusion at the proximal right M1 segment. Moderately robust collateralization evident within the right MCA territory. Abrupt occlusion of the left ICA at its origin at the left bifurcation, age indeterminate. Distal reconstitution at the cavernous segment with good perfusion of the left MCA and both ACAs. Superimposed short-segment severe cavernous left ICA stenosis as above.  Micro Data:    Antimicrobials:     Interim history/subjective:    Objective   Blood pressure (!) 158/97, pulse 89, temperature 98 F (36.7 C), temperature source Axillary, resp. rate 18, height 6' (1.829 m), weight 78.9 kg, SpO2 94 %.        Intake/Output Summary (Last 24 hours) at 03/27/2020 8657 Last data filed at 03/27/2020 0454 Gross per 24 hour  Intake 1200 ml  Output 1000 ml  Net 200 ml   Filed Weights   03/27/20 0100  Weight: 78.9 kg    Examination: General: middle aged male in NAD HENT: [place]/AT, PERRL, no JVD Lungs:Clear Cardiovascular: RRR, no MRG Abdomen: Soft, non-distended Extremities: No acute deformity or ROM limitation Neuro: Sedated GU: Foley  Resolved Hospital Problem list     Assessment & Plan:   R MCA CVA: s/p TPA in ED and mechanical thrombectomy 7/22 - Recover in ICU - Management per nephrology - Echo - Neurochecks per unit protocol - Cleviprex to keep SBP 140-119mmHg  Acute hypoxemic respiratory failure secondary to CVA - Full vent support - Work towards extubation over the course of the day if able -  VAP bundle - Propofol infusion for RASS goal 0 to -1.   COPD without acute exacerbation - Duonebs scheduled  - Smoking cessation counseling.  - Treated for exacerbation starting 7/16. Steroid burst ended 7/21  Hypokalemia - KCl administration pending  Hyperlipidemia - Continue statin, fish oil  Elevated HGB A1C.  - no therapy as of yet, lifestyle modification ongoing.    Best practice:  Diet:  NPO Pain/Anxiety/Delirium protocol (if indicated): Propfol VAP protocol (if indicated): Per protocol DVT prophylaxis: Per primary GI prophylaxis: PPI Glucose control: SSI Mobility: BR Code Status: FULL Family Communication: per primary Disposition: ICU  Labs   CBC: Recent Labs  Lab 03/27/20 0130 03/27/20 0143  WBC 9.0  --   NEUTROABS 6.0  --   HGB 13.2 13.3  HCT 39.9 39.0  MCV 98.5  --   PLT 245  --     Basic Metabolic Panel: Recent Labs  Lab 03/27/20 0130 03/27/20 0143  NA 140 143  K 3.3* 3.1*  CL 112* 110  CO2 17*  --   GLUCOSE 80 74  BUN 15 18  CREATININE 0.80 0.70  CALCIUM 7.6*  --    GFR: Estimated Creatinine Clearance: 101 mL/min (by C-G formula based on SCr of 0.7 mg/dL). Recent Labs  Lab 03/27/20 0130  WBC 9.0    Liver Function Tests: Recent Labs  Lab 03/27/20 0130  AST 19  ALT 12  ALKPHOS 49  BILITOT 0.8  PROT 5.0*  ALBUMIN 2.9*   No results for input(s): LIPASE, AMYLASE in the last 168 hours. No results for input(s): AMMONIA in the last 168 hours.  ABG    Component Value Date/Time   TCO2 21 (L) 03/27/2020 0143     Coagulation Profile: Recent Labs  Lab 03/27/20 0130  INR 1.0    Cardiac Enzymes: No results for input(s): CKTOTAL, CKMB, CKMBINDEX, TROPONINI in the last 168 hours.  HbA1C: Hgb A1c MFr Bld  Date/Time Value Ref Range Status  10/29/2019 08:12 AM 5.6 <5.7 % of total Hgb Final    Comment:    For the purpose of screening for the presence of diabetes: . <5.7%       Consistent with the absence of diabetes 5.7-6.4%    Consistent with increased risk for diabetes             (prediabetes) > or =6.5%  Consistent with diabetes . This assay result is consistent with a decreased risk of diabetes. . Currently, no consensus exists regarding use of hemoglobin A1c for diagnosis of diabetes in children. . According to American Diabetes Association (ADA) guidelines, hemoglobin A1c <7.0% represents optimal control in  non-pregnant diabetic patients. Different metrics may apply to specific patient populations.  Standards of Medical Care in Diabetes(ADA). Marland Kitchen   03/27/2019 08:17 AM 5.4 <5.7 % of total Hgb Final    Comment:    For the purpose of screening for the presence of diabetes: . <5.7%       Consistent with the absence of diabetes 5.7-6.4%    Consistent with increased risk for diabetes             (prediabetes) > or =6.5%  Consistent with diabetes . This assay result is consistent with a decreased risk of diabetes. . Currently, no consensus exists regarding use of hemoglobin A1c for diagnosis of diabetes in children. . According to American Diabetes Association (ADA) guidelines, hemoglobin A1c <7.0% represents optimal control in non-pregnant diabetic patients. Different metrics may apply to specific patient populations.  Standards of Medical Care in Diabetes(ADA). .     CBG: Recent Labs  Lab 03/27/20 0127  GLUCAP 95    Review of Systems:   Patient is encephalopathic and/or intubated. Therefore history has been obtained from chart review.    Past Medical History  He,  has a past medical history of COPD (chronic obstructive pulmonary disease) ([place]), Erectile dysfunction, Headache, Knee pain, left, Seasonal allergies, and Tobacco dependence.   Surgical History    Past Surgical History:  Procedure Laterality Date  . BACK SURGERY     metal plate in back  . COLONOSCOPY    . COLONOSCOPY WITH PROPOFOL N/A 04/26/2019   Procedure: COLONOSCOPY WITH PROPOFOL;  Surgeon: Jonathon Bellows, MD;  Location[facility];  Service: Gastroenterology;  Laterality: N/A;  . HERNIA REPAIR Right   . KNEE ARTHROSCOPY Left 03/28/2015   Procedure: Arthroscopic partial medial meniscectomy plus chondral debridement;  Surgeon: Leanor Kail, MD;  Location: [place];  Service: Orthopedics;  Laterality: Left;     Social History   reports that he quit smoking about 2 years ago. His smoking use  included cigarettes. He started smoking about 51 years ago. He has a 73.50 pack-year smoking history. He has quit using smokeless tobacco. He reports current alcohol use. He reports that he does not use drugs.   Family History   His family history includes COPD in his brother; Cancer in his father; Diabetes in his mother; HIV/AIDS in his brother; Heart attack (age of onset: 53) in his mother. There is no history of Prostate cancer or Colon cancer.   Allergies No Known Allergies   Home Medications  Prior to Admission medications   Medication Sig Start Date End Date Taking? Authorizing Provider  albuterol (VENTOLIN HFA) 108 (90 Base) MCG/ACT inhaler Inhale 1-2 puffs into the lungs every 4 (four) hours as needed for wheezing or shortness of breath. 11/05/19   Karamalegos, Devonne Doughty, DO  Aspirin-Acetaminophen-Caffeine (EXCEDRIN PO) Take 2 tablets by mouth. am    [provider]  azithromycin (ZITHROMAX Z-PAK) 250 MG tablet Take 2 tabs (500mg  total) on Day 1. Take 1 tab (250mg ) daily for next 4 days. 03/21/20   Karamalegos, Devonne Doughty, DO  cyclobenzaprine (FLEXERIL) 10 MG tablet Take 1 tablet (10 mg total) by mouth 3 (three) times daily as needed for muscle spasms. 05/08/19   Karamalegos, Devonne Doughty, DO  guaiFENesin-codeine (ROBITUSSIN AC) 100-10 MG/5ML syrup Take 5-10 mLs by mouth 3 (three) times daily as needed for cough. 03/21/20   Karamalegos, Devonne Doughty, DO  naproxen (NAPROSYN) 500 MG tablet TAKE 1 TABLET (500 MG TOTAL) BY MOUTH 2 (TWO) TIMES DAILY WITH A MEAL. FOR 2-4 WEEKS THEN AS NEEDED 03/30/19   Parks Ranger, Devonne Doughty, DO  Omega-3 Fatty Acids (FISH OIL) 1000 MG CAPS Take 2,000 mg by mouth in the morning and at bedtime.    [provider]  predniSONE (DELTASONE) 50 MG tablet Take 1 tablet (50 mg total) by mouth daily with breakfast. 03/21/20   Parks Ranger, Devonne Doughty, DO  rosuvastatin (CRESTOR) 10 MG tablet Take 2 tablets (20 mg total) by mouth daily. 12/27/19   Karamalegos,  Devonne Doughty, DO  sildenafil (REVATIO) 20 MG tablet TAKE 1 TO 5 TABLETS BY MOUTH ABOUT 30 MINUTES PRIOR TO SEX.START WITH 1 THEN INCREASE 10/23/19   Karamalegos, Devonne Doughty, DO  Tiotropium Bromide-Olodaterol (STIOLTO RESPIMAT) 2.5-2.5 MCG/ACT AERS Inhale 2 puffs into the lungs daily. 12/27/19   Lauraine Rinne, NP  vitamin C (ASCORBIC ACID)  500 MG tablet Take 500 mg by mouth daily. Am    [provider]     Critical care time: 35 mins     Georgann Housekeeper, AGACNP-BC [place] for personal pager PCCM on call pager [phone]  03/27/2020 6:57 AM

## 2020-03-27 NOTE — ED Triage Notes (Signed)
Pt BIB EMS c/o stroke like symptoms including left side weakness and left side facial paralysis.   CODE stroke called. LKN 2300 on 03/26/2020

## 2020-03-27 NOTE — Progress Notes (Signed)
Spoke with Dr. Leonel Ramsay for clarification on SBP goal due to conflicting notes and orders. SBP goal 130-160.

## 2020-03-27 NOTE — Transfer of Care (Signed)
Immediate Anesthesia Transfer of Care Note  Patient: Ethan Fitzgerald  Procedure(s) Performed: IR WITH ANESTHESIA (N/A )  Patient Location: ICU  Anesthesia Type:General  Level of Consciousness: Patient remains intubated per anesthesia plan  Airway & Oxygen Therapy: Patient remains intubated per anesthesia plan and Patient placed on Ventilator (see vital sign flow sheet for setting)  Post-op Assessment: Report given to RN and Post -op Vital signs reviewed and stable  Post vital signs: Reviewed and stable  Last Vitals:  Vitals Value Taken Time  BP    Temp    Pulse    Resp    SpO2      Last Pain:  Vitals:   03/27/20 0215  TempSrc:   PainSc: 0-No pain         Complications: No complications documented.

## 2020-03-27 NOTE — Progress Notes (Signed)
Per IR report SBP goal of 140-160

## 2020-03-27 NOTE — Consult Note (Signed)
Responded to security page advising they'd placed pt's family member  in Consult A. Met there with pt's wife [name], who'd already had medical briefing re: pt's status arriving post-stroke. Accompanied her as doctor took her to speak briefly with  pt before pt went to CT Lab. [name] said she was [place], but did not know if pt would want prayer before surgery, so I did not approach him for that purpose then.   After pre-op  medical briefing of [name], accompanied her back to consult A, where her sister Nevin Bloodgood was waiting.Marland KitchenAccompanied [name] and Nevin Bloodgood to atrium seating area on 1st floor outside CT lab. Advised them of nearby 24/7 [place]. Doctors know they are waiting there and will seek [name] to report any med news re: pt. They have Joan's cell no.  Prayed there with [name] and Nevin Bloodgood, which they appreciated. Nevin Bloodgood mentioned that pt had had covid, so she didn't see why he'd also then needed covid vaccines (which [name] had told doctors pt had). Joan's daughter worked here for years, and is now at [employer]. Joa will be informing her of pt's status consulting with her also when her daughter gets off work at 0400. I let [name] know she could ask staff to page for a chaplain again at any time further chaplain services may be desired..   Rev. Eloise Levels Chaplain

## 2020-03-27 NOTE — Progress Notes (Signed)
  Echocardiogram 2D Echocardiogram has been performed.  Darlina Sicilian M 03/27/2020, 10:39 AM

## 2020-03-27 NOTE — Progress Notes (Signed)
SLP Cancellation Note  Patient Details Name: ESCO JOSLYN MRN: [id number] DOB: 1955-11-08   Cancelled treatment:       Reason Eval/Treat Not Completed: Medical issues which prohibited therapy (on the vent this am). Will f/u as able.   Osie Bond., M.A. [facility] Pager [phone] Office [phone]  03/27/2020, 7:20 AM

## 2020-03-27 NOTE — Progress Notes (Signed)
[name], male   DOB: December 12, 1955, 64 y.o.   MRN: [id number] INR. [place] 10 45 pmLN. MRSS 0. New onset Lt sided weakness and slurred speech. NIH 9 CT brain No ICH. Hyperdense RT MCA. ASPECTS 8. CTA occluded distal RT  MCA M1 /inferior division .Marland Kitchen Occluded ICA prox bilaterally. Endovascular treatment of the symptomatic RT MCA  D/W spouse .  Reasons,procedure and alternatives reviewed. Risks of ICH  Of 10 %,worsening neuro deficit,death ,inability to revascularize discussed with the spouse. She expressed understanding and gave consent  For the treatment. S.Angelus Hoopes MD

## 2020-03-27 NOTE — H&P (Signed)
Neurology H&P  CC: Left-sided weakness  History is obtained from: Patient, wife  HPI: Ethan Fitzgerald is a 64 y.o. male with a history of COPD who was in his normal state of health this evening.  He went to bed around 10 PM, but did not go to sleep because he was watching TV until close to 11 PM.   He woke himself up coughing and then tried to get up out of bed and found that he was weak on the left side.  His wife stated that he was difficult to understand called 911.  EMS arrived and activated code stroke en route to the hospital.  He has been coughing some recently, and was diagnosed by televisit with bronchitis.  LKW: 10:45 PM tpa given?:  Yes Modified Rankin Scale: 0-Completely asymptomatic and back to baseline post- stroke  NIHSS: nine  ROS: A 14 point ROS was performed and is negative except as noted in the HPI.   Past Medical History:  Diagnosis Date  . COPD (chronic obstructive pulmonary disease) ([facility])    mild  . Erectile dysfunction   . Headache    daily, stress headaches  . Knee pain, left   . Seasonal allergies   . Tobacco dependence      Family History  Problem Relation Age of Onset  . Diabetes Mother   . Heart attack Mother 79  . Cancer Father        liver   . COPD Brother   . HIV/AIDS Brother   . Prostate cancer Neg Hx   . Colon cancer Neg Hx      Social History:  reports that he quit smoking about 2 years ago. His smoking use included cigarettes. He started smoking about 51 years ago. He has a 73.50 pack-year smoking history. He has quit using smokeless tobacco. He reports current alcohol use. He reports that he does not use drugs.   Exam: Current vital signs: BP (!) 158/97   Pulse 89   Temp 98 F (36.7 C) (Axillary)   Resp 18   Ht 6' (1.829 m)   Wt 78.9 kg   SpO2 94%   BMI 23.60 kg/m  Vital signs in last 24 hours: Temp:  [98 F (36.7 C)] 98 F (36.7 C) (07/22 0149) Pulse Rate:  [89-97] 89 (07/22 0212) Resp:  [14-18] 18 (07/22 0220) BP:  (148-164)/(90-102) 158/97 (07/22 0220) SpO2:  [93 %-95 %] 94 % (07/22 0220) Weight:  [78.9 kg] 78.9 kg (07/22 0100)  Physical Exam  Constitutional: Appears well-developed and well-nourished.  Psych: Affect appropriate to situation Eyes: No scleral injection HENT: No OP obstrucion Head: Normocephalic.  Cardiovascular: Normal rate and regular rhythm.  Respiratory: Effort normal and breath sounds normal to anterior ascultation GI: Soft.  No distension. There is no tenderness.  Skin: WDI  Neuro: Mental Status: Patient is awake, alert, oriented to person, place, month, year, and situation. Patient is able to give a clear and coherent history. No signs of aphasia  He does have some sensory neglect Cranial Nerves: II: Visual Fields are full. Pupils are equal, round, and reactive to light.   III,IV, VI: EOMI without ptosis or diploplia.  V: Facial sensation is initially diminished on the left VII: Facial movement is diminished on the left Motor: He has good strength on the right, he has 4/5 weakness of the left arm and leg with fine motor movements markedly impaired out of proportion to weakness. Sensory: Sensation is diminished on the left Cerebellar:  Does not perform   I have reviewed labs in epic and the results pertinent to this consultation are: CMP-unremarkable  I have reviewed the images obtained: CT head-possible early ischemic change  Primary Diagnosis:  Cerebral infarction due to embolism of right middle cerebral artery  Secondary Diagnosis: Cerebral edema and Hypokalemia Cerebral infarction due to occlusion of the right internal carotid artery  Impression: 64 year old male with bilateral carotid occlusions and right MCA occlusion.  His right MCA occlusion appears to be symptomatic lesion, likely due to his right carotid occlusion which is more likely to be acute.  Given that his left ICA occlusion is asymptomatic, I suspect this is chronic.  Recommendations: -  HgbA1c, fasting lipid panel - MRI  of the brain without contrast - Frequent neuro checks - Echocardiogram - Prophylactic therapy-none for 24 hours - Risk factor modification - Telemetry monitoring - PT consult, OT consult, Speech consult -Replete potassium - Stroke team to follow    This patient is critically ill and at significant risk of neurological worsening, death and care requires constant monitoring of vital signs, hemodynamics,respiratory and cardiac monitoring, neurological assessment, discussion with family, other specialists and medical decision making of high complexity. I spent 70 minutes of neurocritical care time  in the care of  this patient. This was time spent independent of any time provided by nurse practitioner or PA.  Roland Rack, MD [facility] [phone]  If 7pm- 7am, please page neurology on call as listed in [place]. 03/27/2020  2:59 AM

## 2020-03-27 NOTE — Procedures (Signed)
S/P 4 vessel cerebral arteriogram. RT CFA approach. Findings. 1.S/P partial revascularization of RT MCA dominantinf division with x 3 passes with solitaire 43mmx 40 mm X retriever and x 1 pass with the solitaire68mmx 20 mm retriever and aspiration achieving a TICI 2ATICI 2b revascularization.  2. S/P balloon angioplasty of acutely occluded prox RT ICA with re occlusion. Robust collaterales from both PCAs  P3 seg and PCOM s to reconstitute  the  RT MCA distribution. Post CT brain mild to mod RT perii insular and post frontal convexity contrast stain =/- SAH . RT groin hemostasis with an 58F angioseal. Distal pulses palpable DPs and PTs bilaterally. Patient left intubated for airway obstruction. S.Jaymir Struble MD

## 2020-03-27 NOTE — Progress Notes (Signed)
Pharmacist Code Stroke Response  Notified to mix tPA at 0146 by Dr. Leonel Ramsay. Delivered tPA to RN at [place].  tPA dose = 7.1mg  bolus over 1 minute followed by 63.9mg  for a total dose of 71mg  over 1 hour  Issues/delays encountered (if applicable): None; started at 0151.  Wynona Neat, PharmD, BCPS  03/27/20 1:52 AM

## 2020-03-27 NOTE — Anesthesia Postprocedure Evaluation (Signed)
Anesthesia Post Note  Patient: Ethan Fitzgerald  Procedure(s) Performed: IR WITH ANESTHESIA (N/A )     Patient location during evaluation: SICU Anesthesia Type: General Level of consciousness: sedated Pain management: pain level controlled Vital Signs Assessment: post-procedure vital signs reviewed and stable Respiratory status: patient remains intubated per anesthesia plan Cardiovascular status: stable Postop Assessment: no apparent nausea or vomiting Anesthetic complications: no   No complications documented.  Last Vitals:  Vitals:   03/27/20 0220 03/27/20 0645  BP: (!) 158/97 (!) 136/95  Pulse:    Resp: 18 18  Temp:    SpO2: 94% 100%    Last Pain:  Vitals:   03/27/20 0215  TempSrc:   PainSc: 0-No pain                 Arren Laminack,W. EDMOND

## 2020-03-27 NOTE — Progress Notes (Addendum)
Referring Physician(s): Dr Leafy Half  Supervising Physician: Luanne Bras  Patient Status:  [facility] - In-pt  Chief Complaint:  CVA R MCA revascularization in IR this am- Dr Estanislado Pandy   Subjective:  Intubated Does seem to be able to nod yes/no appropriately Moving left to command-- slow to grip and weak No movement on Rt  Wife at bedside  Allergies: Patient has no known allergies.  Medications: Prior to Admission medications   Medication Sig Start Date End Date Taking? Authorizing Provider  albuterol (VENTOLIN HFA) 108 (90 Base) MCG/ACT inhaler Inhale 1-2 puffs into the lungs every 4 (four) hours as needed for wheezing or shortness of breath. 11/05/19  Yes Karamalegos, Devonne Doughty, DO  Aspirin-Acetaminophen-Caffeine (EXCEDRIN PO) Take 2 tablets by mouth. am   Yes [provider]  guaiFENesin-codeine (ROBITUSSIN AC) 100-10 MG/5ML syrup Take 5-10 mLs by mouth 3 (three) times daily as needed for cough. 03/21/20  Yes Karamalegos, Devonne Doughty, DO  Omega-3 Fatty Acids (FISH OIL) 1000 MG CAPS Take 2,000 mg by mouth in the morning and at bedtime.   Yes [provider]  predniSONE (DELTASONE) 50 MG tablet Take 1 tablet (50 mg total) by mouth daily with breakfast. 03/21/20  Yes Karamalegos, Devonne Doughty, DO  rosuvastatin (CRESTOR) 10 MG tablet Take 2 tablets (20 mg total) by mouth daily. 12/27/19  Yes Karamalegos, Devonne Doughty, DO  Tiotropium Bromide-Olodaterol (STIOLTO RESPIMAT) 2.5-2.5 MCG/ACT AERS Inhale 2 puffs into the lungs daily. 12/27/19  Yes Lauraine Rinne, NP  vitamin C (ASCORBIC ACID) 500 MG tablet Take 500 mg by mouth daily. Am   Yes [provider]  azithromycin (ZITHROMAX Z-PAK) 250 MG tablet Take 2 tabs (500mg  total) on Day 1. Take 1 tab (250mg ) daily for next 4 days. Patient not taking: Reported on 03/27/2020 03/21/20   Olin Hauser, DO  cyclobenzaprine (FLEXERIL) 10 MG tablet Take 1 tablet (10 mg total) by mouth 3 (three) times daily  as needed for muscle spasms. Patient not taking: Reported on 03/27/2020 05/08/19   Olin Hauser, DO  naproxen (NAPROSYN) 500 MG tablet TAKE 1 TABLET (500 MG TOTAL) BY MOUTH 2 (TWO) TIMES DAILY WITH A MEAL. FOR 2-4 WEEKS THEN AS NEEDED Patient not taking: Reported on 03/27/2020 03/30/19   Olin Hauser, DO  sildenafil (REVATIO) 20 MG tablet TAKE 1 TO 5 TABLETS BY MOUTH ABOUT 30 MINUTES PRIOR TO SEX.START WITH 1 THEN INCREASE Patient not taking: Reported on 03/27/2020 10/23/19   Olin Hauser, DO     Vital Signs: BP 140/86    Pulse (!) 103    Temp 97.9 F (36.6 C) (Axillary)    Resp (!) 22    Ht 6' (1.829 m)    Wt 174 lb (78.9 kg)    SpO2 100%    BMI 23.60 kg/m   Physical Exam Vitals reviewed.  Constitutional:      Comments: Intubated Can nod yes/no appropriately-- answering questions Right facial droop   Musculoskeletal:     Comments: Moves left hand to command Grips - but weak Left foot moves to command  Right has no movement  Skin:    General: Skin is warm.     Comments: Right groin site is clean and dry NO bleeding No hematoma Rt foot 1+ pulses     Imaging: CT Code Stroke CTA Head W/WO contrast  Result Date: 03/27/2020 CLINICAL DATA:  Initial evaluation for acute left-sided weakness. EXAM: CT ANGIOGRAPHY HEAD AND NECK TECHNIQUE: Multidetector CT imaging  of the head and neck was performed using the standard protocol during bolus administration of intravenous contrast. Multiplanar CT image reconstructions and MIPs were obtained to evaluate the vascular anatomy. Carotid stenosis measurements (when applicable) are obtained utilizing NASCET criteria, using the distal internal carotid diameter as the denominator. CONTRAST:  78mL OMNIPAQUE IOHEXOL 350 MG/ML SOLN COMPARISON:  Prior head CT from earlier same day. FINDINGS: CTA NECK FINDINGS Aortic arch: Visualized aortic arch of normal caliber with normal 3 vessel morphology. Atheromatous change at the origin  of the left subclavian artery without flow-limiting stenosis. Right carotid system: Right common carotid artery patent from its origin to the bifurcation without stenosis. There is abrupt occlusion of the right ICA at its origin at the right carotid bifurcation. Right ICA remains occluded within the neck. Right external carotid artery and its branches remain patent and well perfused. Left carotid system: Left common carotid artery patent from its origin to the bifurcation without stenosis. Atheromatous change at the left bifurcation with associated abrupt occlusion of the left ICA at its origin. Left ICA remains occluded within the neck. Left external carotid artery and its branches remain well perfused and patent. Vertebral arteries: Both vertebral arteries arise from the subclavian arteries. Right vertebral artery dominant. Vertebral arteries widely patent within the neck without stenosis, dissection, or occlusion. Skeleton: No definite acute osseous abnormality identified. No discrete lytic or blastic osseous lesions. Patient is edentulous. Other neck: No other acute soft tissue abnormality within the neck. No mass lesion or adenopathy. Upper chest: Visualized upper chest demonstrates no acute finding. Moderate emphysematous changes noted. Review of the MIP images confirms the above findings CTA HEAD FINDINGS Anterior circulation: Both internal carotid arteries occluded at the skull base. Distal reconstitution at the cavernous segments bilaterally likely via of collateral flow from the posterior circulation or possibly external carotid branches. Scattered atheromatous change within the carotid siphons bilaterally. No high-grade stenosis on the right. On the left, there is a focal severe stenosis involving the cavernous left ICA (series 6, image 112). ICA termini patent bilaterally. A1 segments patent. Normal anterior communicating artery complex. Both anterior cerebral arteries remain patent and well perfused.  Occlusive thrombus seen within the proximal left M1 segment, corresponding with hyperdense M1 seen on prior CT (series 6, image 98). Small anterior temporal branch appears to remain patent. Additional scattered flow seen within right MCA branches distally, likely collateral nature, overall moderately robust in appearance. Left M1 widely patent. Normal left MCA bifurcation. Distal left MCA branches well perfused and patent. Posterior circulation: Both vertebral arteries are widely patent to the vertebrobasilar junction. Neither PICA well visualized. Basilar mildly tortuous but widely patent to its distal aspect without stenosis. Superior cerebral arteries patent bilaterally. Both PCAs primarily supplied via the basilar and are well perfused to their distal aspects. Probable small bilateral posterior communicating arteries noted. Venous sinuses: Grossly patent allowing for timing the contrast bolus. Anatomic variants: Dominant right vertebral artery. No intracranial aneurysm. Review of the MIP images confirms the above findings IMPRESSION: 1. Positive CTA for LVO, with abrupt occlusion of the right ICA at its origin at the right bifurcation. Distal reconstitution at the cavernous segment with subsequent reocclusion at the proximal right M1 segment. Moderately robust collateralization evident within the right MCA territory. 2. Abrupt occlusion of the left ICA at its origin at the left bifurcation, age indeterminate. Distal reconstitution at the cavernous segment with good perfusion of the left MCA and both ACAs. Superimposed short-segment severe cavernous left ICA stenosis as above.  3. Wide patency of the posterior circulation. Right vertebral artery dominant. 4.  Emphysema (ICD10-J43.9). These results were communicated to Dr. Leonel Ramsay at [place] 7/22/2021by text page via the [facility] messaging system. Electronically Signed   By: Jeannine Boga M.D.   On: 03/27/2020 02:20   CT Code Stroke CTA Neck W/WO  contrast  Result Date: 03/27/2020 CLINICAL DATA:  Initial evaluation for acute left-sided weakness. EXAM: CT ANGIOGRAPHY HEAD AND NECK TECHNIQUE: Multidetector CT imaging of the head and neck was performed using the standard protocol during bolus administration of intravenous contrast. Multiplanar CT image reconstructions and MIPs were obtained to evaluate the vascular anatomy. Carotid stenosis measurements (when applicable) are obtained utilizing NASCET criteria, using the distal internal carotid diameter as the denominator. CONTRAST:  34mL OMNIPAQUE IOHEXOL 350 MG/ML SOLN COMPARISON:  Prior head CT from earlier same day. FINDINGS: CTA NECK FINDINGS Aortic arch: Visualized aortic arch of normal caliber with normal 3 vessel morphology. Atheromatous change at the origin of the left subclavian artery without flow-limiting stenosis. Right carotid system: Right common carotid artery patent from its origin to the bifurcation without stenosis. There is abrupt occlusion of the right ICA at its origin at the right carotid bifurcation. Right ICA remains occluded within the neck. Right external carotid artery and its branches remain patent and well perfused. Left carotid system: Left common carotid artery patent from its origin to the bifurcation without stenosis. Atheromatous change at the left bifurcation with associated abrupt occlusion of the left ICA at its origin. Left ICA remains occluded within the neck. Left external carotid artery and its branches remain well perfused and patent. Vertebral arteries: Both vertebral arteries arise from the subclavian arteries. Right vertebral artery dominant. Vertebral arteries widely patent within the neck without stenosis, dissection, or occlusion. Skeleton: No definite acute osseous abnormality identified. No discrete lytic or blastic osseous lesions. Patient is edentulous. Other neck: No other acute soft tissue abnormality within the neck. No mass lesion or adenopathy. Upper  chest: Visualized upper chest demonstrates no acute finding. Moderate emphysematous changes noted. Review of the MIP images confirms the above findings CTA HEAD FINDINGS Anterior circulation: Both internal carotid arteries occluded at the skull base. Distal reconstitution at the cavernous segments bilaterally likely via of collateral flow from the posterior circulation or possibly external carotid branches. Scattered atheromatous change within the carotid siphons bilaterally. No high-grade stenosis on the right. On the left, there is a focal severe stenosis involving the cavernous left ICA (series 6, image 112). ICA termini patent bilaterally. A1 segments patent. Normal anterior communicating artery complex. Both anterior cerebral arteries remain patent and well perfused. Occlusive thrombus seen within the proximal left M1 segment, corresponding with hyperdense M1 seen on prior CT (series 6, image 98). Small anterior temporal branch appears to remain patent. Additional scattered flow seen within right MCA branches distally, likely collateral nature, overall moderately robust in appearance. Left M1 widely patent. Normal left MCA bifurcation. Distal left MCA branches well perfused and patent. Posterior circulation: Both vertebral arteries are widely patent to the vertebrobasilar junction. Neither PICA well visualized. Basilar mildly tortuous but widely patent to its distal aspect without stenosis. Superior cerebral arteries patent bilaterally. Both PCAs primarily supplied via the basilar and are well perfused to their distal aspects. Probable small bilateral posterior communicating arteries noted. Venous sinuses: Grossly patent allowing for timing the contrast bolus. Anatomic variants: Dominant right vertebral artery. No intracranial aneurysm. Review of the MIP images confirms the above findings IMPRESSION: 1. Positive CTA for LVO,  with abrupt occlusion of the right ICA at its origin at the right bifurcation. Distal  reconstitution at the cavernous segment with subsequent reocclusion at the proximal right M1 segment. Moderately robust collateralization evident within the right MCA territory. 2. Abrupt occlusion of the left ICA at its origin at the left bifurcation, age indeterminate. Distal reconstitution at the cavernous segment with good perfusion of the left MCA and both ACAs. Superimposed short-segment severe cavernous left ICA stenosis as above. 3. Wide patency of the posterior circulation. Right vertebral artery dominant. 4.  Emphysema (ICD10-J43.9). These results were communicated to Dr. Leonel Ramsay at [place] 7/22/2021by text page via the [facility] messaging system. Electronically Signed   By: Jeannine Boga M.D.   On: 03/27/2020 02:20   Portable Chest x-ray  Result Date: 03/27/2020 CLINICAL DATA:  Intubation. EXAM: PORTABLE CHEST 1 VIEW COMPARISON:  12/04/2019. FINDINGS: Endotracheal tube noted with its tip 5 cm above the carina. NG tube noted with its tip coiled stomach. Heart size normal. Low lung volumes with bibasilar. Right costophrenic angle incompletely imaged. No pleural effusion noted. No pneumothorax. IMPRESSION: 1. Endotracheal tube noted with tip 5 cm above the carina. NG tube noted with tip coiled stomach. 2.  Low lung volumes with bibasilar atelectasis. Electronically Signed   By: Marcello Moores  Register   On: 03/27/2020 07:20   CT HEAD CODE STROKE WO CONTRAST  Result Date: 03/27/2020 CLINICAL DATA:  Code stroke. Initial evaluation for acute left-sided weakness. EXAM: CT HEAD WITHOUT CONTRAST TECHNIQUE: Contiguous axial images were obtained from the base of the skull through the vertex without intravenous contrast. COMPARISON:  None available. FINDINGS: Brain: Generalized age-related cerebral atrophy. No acute intracranial hemorrhage. There is subtle asymmetric hypodensity involving the right caudate and lentiform nuclei, suspicious for early developing right MCA territory ischemia. Gray-white matter  differentiation otherwise grossly maintained elsewhere within the brain. No mass lesion, midline shift or mass effect. No hydrocephalus or extra-axial fluid collection. Vascular: Dense right M1 segment concerning for thrombus and LVO. Skull: Scalp soft tissues within normal limits.  Calvarium intact. Sinuses/Orbits: Globes and orbital soft tissues within normal limits. Chronic mucoperiosteal thickening noted within the ethmoidal air cells. Paranasal sinuses are otherwise clear. No mastoid effusion. Other: None. ASPECTS [facility] Stroke Program Early CT Score) - Ganglionic level infarction (caudate, lentiform nuclei, internal capsule, insula, M1-M3 cortex): 5 - Supraganglionic infarction (M4-M6 cortex): 3 Total score (0-10 with 10 being normal): 8 IMPRESSION: 1. Dense right M1 segment, concerning for thrombus/LVO. Subtle asymmetric hypodensity involving the right caudate and lentiform nuclei concerning for early ischemic changes. No intracranial hemorrhage. 2. ASPECTS is 8. These results were communicated to Dr. Leonel Ramsay at [place] 7/22/2021by text page via the [facility] messaging system. Electronically Signed   By: Jeannine Boga M.D.   On: 03/27/2020 01:51    Labs:  CBC: Recent Labs    03/27/20 0130 03/27/20 0143 03/27/20 0823 03/27/20 0832  WBC 9.0  --  16.3*  --   HGB 13.2 13.3 15.9 15.3  HCT 39.9 39.0 47.0 45.0  PLT 245  --  269  --     COAGS: Recent Labs    03/27/20 0130  INR 1.0  APTT 24    BMP: Recent Labs    10/29/19 0812 03/27/20 0130 03/27/20 0143 03/27/20 0832  NA 139 140 143 139  K 4.4 3.3* 3.1* 4.7  CL 105 112* 110  --   CO2 26 17*  --   --   GLUCOSE 142* 80 74  --  BUN 13 15 18   --   CALCIUM 9.3 7.6*  --   --   CREATININE 0.99 0.80 0.70  --   GFRNONAA 80 >60  --   --   GFRAA 92 >60  --   --     LIVER FUNCTION TESTS: Recent Labs    10/29/19 0812 03/27/20 0130  BILITOT 1.0 0.8  AST 16 19  ALT 12 12  ALKPHOS  --  49  PROT 6.4 5.0*  ALBUMIN  --   2.9*    Assessment and Plan:  CVA R MCA revascularization in IR Intubated  Moving left to command Will follow Will report to Dr Estanislado Pandy  Electronically Signed: Lavonia Drafts, PA-C 03/27/2020, 9:38 AM   I spent a total of 15 Minutes at the the patient's bedside AND on the patient's hospital floor or unit, greater than 50% of which was counseling/coordinating care for R MCA revascularization

## 2020-03-27 NOTE — Progress Notes (Signed)
ABG reported to MD. RR changed to 24 per MD.

## 2020-03-27 NOTE — ED Provider Notes (Signed)
[facility] EMERGENCY DEPARTMENT Provider Note  CSN: [id number] Arrival date & time: 03/27/20 0125  Chief Complaint(s) No chief complaint on file.  HPI Ethan Fitzgerald is a 64 y.o. male past medical history listed below who presents to the emergency department for left-sided weakness.  Last known normal approximately 11 PM, which is when he fell asleep.  Patient awoke around 12:30 AM due to coughing spell.  When he attempted to get out of bed, he fell.  Wife noted the left-sided deficits and called EMS.  Patient has left facial droop left upper and lower extremity weakness with dysarthria.  He is not anticoagulated.  No recent fevers or infections.  No chest pain or shortness of breath.  No other physical complaints.  Code stroke was not communicated to our facility.  Code stroke was activated upon arrival.  HPI  Past Medical History Past Medical History:  Diagnosis Date  . COPD (chronic obstructive pulmonary disease) (HCC)    mild  . Erectile dysfunction   . Headache    daily, stress headaches  . Knee pain, left   . Seasonal allergies   . Tobacco dependence    Patient Active Problem List   Diagnosis Date Noted  . History of COVID-19 12/04/2019  . Centrilobular emphysema ([place]) 04/03/2019  . Seasonal allergies 02/18/2017  . Elevated hemoglobin A1c 02/18/2017  . Erectile dysfunction 02/18/2017  . Hyperlipidemia 02/18/2017  . History of hemorrhoids 02/18/2017  . Former smoker 02/18/2017  . Chronic low back pain 02/18/2017  . Chronic pain of left knee 02/18/2017   Home Medication(s) Prior to Admission medications   Medication Sig Start Date End Date Taking? Authorizing Provider  albuterol (VENTOLIN HFA) 108 (90 Base) MCG/ACT inhaler Inhale 1-2 puffs into the lungs every 4 (four) hours as needed for wheezing or shortness of breath. 11/05/19   Karamalegos, Devonne Doughty, DO  Aspirin-Acetaminophen-Caffeine (EXCEDRIN PO) Take 2 tablets by mouth. am    [provider]  azithromycin (ZITHROMAX Z-PAK) 250 MG tablet Take 2 tabs (500mg  total) on Day 1. Take 1 tab (250mg ) daily for next 4 days. 03/21/20   Karamalegos, Devonne Doughty, DO  cyclobenzaprine (FLEXERIL) 10 MG tablet Take 1 tablet (10 mg total) by mouth 3 (three) times daily as needed for muscle spasms. 05/08/19   Karamalegos, Devonne Doughty, DO  guaiFENesin-codeine (ROBITUSSIN AC) 100-10 MG/5ML syrup Take 5-10 mLs by mouth 3 (three) times daily as needed for cough. 03/21/20   Karamalegos, Devonne Doughty, DO  naproxen (NAPROSYN) 500 MG tablet TAKE 1 TABLET (500 MG TOTAL) BY MOUTH 2 (TWO) TIMES DAILY WITH A MEAL. FOR 2-4 WEEKS THEN AS NEEDED 03/30/19   Parks Ranger, Devonne Doughty, DO  Omega-3 Fatty Acids (FISH OIL) 1000 MG CAPS Take 2,000 mg by mouth in the morning and at bedtime.    [provider]  predniSONE (DELTASONE) 50 MG tablet Take 1 tablet (50 mg total) by mouth daily with breakfast. 03/21/20   Parks Ranger, Devonne Doughty, DO  rosuvastatin (CRESTOR) 10 MG tablet Take 2 tablets (20 mg total) by mouth daily. 12/27/19   Karamalegos, Devonne Doughty, DO  sildenafil (REVATIO) 20 MG tablet TAKE 1 TO 5 TABLETS BY MOUTH ABOUT 30 MINUTES PRIOR TO SEX.START WITH 1 THEN INCREASE 10/23/19   Karamalegos, Devonne Doughty, DO  Tiotropium Bromide-Olodaterol (STIOLTO RESPIMAT) 2.5-2.5 MCG/ACT AERS Inhale 2 puffs into the lungs daily. 12/27/19   Lauraine Rinne, NP  vitamin C (ASCORBIC ACID) 500 MG tablet Take 500 mg by mouth daily. Am  [provider]                                                                                                                                    Past Surgical History Past Surgical History:  Procedure Laterality Date  . BACK SURGERY     metal plate in back  . COLONOSCOPY    . COLONOSCOPY WITH PROPOFOL N/A 04/26/2019   Procedure: COLONOSCOPY WITH PROPOFOL;  Surgeon: Jonathon Bellows, MD;  Location: [facility] ENDOSCOPY;  Service: Gastroenterology;  Laterality: N/A;  . HERNIA REPAIR Right     . KNEE ARTHROSCOPY Left 03/28/2015   Procedure: Arthroscopic partial medial meniscectomy plus chondral debridement;  Surgeon: Leanor Kail, MD;  Location: [place];  Service: Orthopedics;  Laterality: Left;   Family History Family History  Problem Relation Age of Onset  . Diabetes Mother   . Heart attack Mother 70  . Cancer Father        liver   . COPD Brother   . HIV/AIDS Brother   . Prostate cancer Neg Hx   . Colon cancer Neg Hx     Social History Social History   Tobacco Use  . Smoking status: Former Smoker    Packs/day: 1.50    Years: 49.00    Pack years: 73.50    Types: Cigarettes    Start date: 1970    Quit date: 09/14/2017    Years since quitting: 2.5  . Smokeless tobacco: Former Network engineer  . Vaping Use: Never used  Substance Use Topics  . Alcohol use: Yes    Comment: 6 pack on weekend  . Drug use: No   Allergies Patient has no known allergies.  Review of Systems Review of Systems All other systems are reviewed and are negative for acute change except as noted in the HPI  Physical Exam Vital Signs  I have reviewed the triage vital signs BP (!) 148/90 (BP Location: Right Arm)   Pulse 97   Temp 98 F (36.7 C) (Axillary)   Resp 14   Ht 6' (1.829 m)   Wt 78.9 kg   SpO2 93%   BMI 23.60 kg/m   Physical Exam Vitals reviewed.  Constitutional:      General: He is not in acute distress.    Appearance: He is well-developed. He is not diaphoretic.  HENT:     Head: Normocephalic and atraumatic.     Nose: Nose normal.  Eyes:     General: No scleral icterus.       Right eye: No discharge.        Left eye: No discharge.     Conjunctiva/sclera: Conjunctivae normal.     Pupils: Pupils are equal, round, and reactive to light.  Cardiovascular:     Rate and Rhythm: Normal rate and regular rhythm.     Heart sounds: No murmur heard.  No friction rub. No gallop.  Pulmonary:     Effort: Pulmonary effort is normal. No respiratory distress.      Breath sounds: Normal breath sounds. No stridor. No rales.  Abdominal:     General: There is no distension.     Palpations: Abdomen is soft.     Tenderness: There is no abdominal tenderness.  Musculoskeletal:        General: No tenderness.     Cervical back: Normal range of motion and neck supple.  Skin:    General: Skin is warm and dry.     Findings: No erythema or rash.  Neurological:     Mental Status: He is alert and oriented to person, place, and time.     Comments: Dysarthria Left facial droop Left upper extremity weakness.  With pronation and drift. More detailed neuro exam by neurology.     ED Results and Treatments Labs (all labs ordered are listed, but only abnormal results are displayed) Labs Reviewed  CBC - Abnormal; Notable for the following components:      Result Value   RBC 4.05 (*)    All other components within normal limits  COMPREHENSIVE METABOLIC PANEL - Abnormal; Notable for the following components:   Potassium 3.3 (*)    Chloride 112 (*)    CO2 17 (*)    Calcium 7.6 (*)    Total Protein 5.0 (*)    Albumin 2.9 (*)    All other components within normal limits  I-STAT CHEM 8, ED - Abnormal; Notable for the following components:   Potassium 3.1 (*)    Calcium, Ion 0.97 (*)    TCO2 21 (*)    All other components within normal limits  SARS CORONAVIRUS 2 BY RT PCR (HOSPITAL ORDER, [place] [facility])  ETHANOL  PROTIME-INR  APTT  DIFFERENTIAL  RAPID URINE DRUG SCREEN, HOSP PERFORMED  URINALYSIS, ROUTINE W REFLEX MICROSCOPIC  CBG MONITORING, ED                                                                                                                         EKG  EKG Interpretation  Date/Time:    Ventricular Rate:    PR Interval:    QRS Duration:   QT Interval:    QTC Calculation:   R Axis:     Text Interpretation:        Radiology CT HEAD CODE STROKE WO CONTRAST  Result Date: 03/27/2020 CLINICAL DATA:  Code  stroke. Initial evaluation for acute left-sided weakness. EXAM: CT HEAD WITHOUT CONTRAST TECHNIQUE: Contiguous axial images were obtained from the base of the skull through the vertex without intravenous contrast. COMPARISON:  None available. FINDINGS: Brain: Generalized age-related cerebral atrophy. No acute intracranial hemorrhage. There is subtle asymmetric hypodensity involving the right caudate and lentiform nuclei, suspicious for early developing right MCA territory ischemia. Gray-white matter differentiation otherwise grossly maintained elsewhere within the brain. No mass lesion, midline shift or mass effect. No hydrocephalus or extra-axial fluid  collection. Vascular: Dense right M1 segment concerning for thrombus and LVO. Skull: Scalp soft tissues within normal limits.  Calvarium intact. Sinuses/Orbits: Globes and orbital soft tissues within normal limits. Chronic mucoperiosteal thickening noted within the ethmoidal air cells. Paranasal sinuses are otherwise clear. No mastoid effusion. Other: None. ASPECTS [facility] Stroke Program Early CT Score) - Ganglionic level infarction (caudate, lentiform nuclei, internal capsule, insula, M1-M3 cortex): 5 - Supraganglionic infarction (M4-M6 cortex): 3 Total score (0-10 with 10 being normal): 8 IMPRESSION: 1. Dense right M1 segment, concerning for thrombus/LVO. Subtle asymmetric hypodensity involving the right caudate and lentiform nuclei concerning for early ischemic changes. No intracranial hemorrhage. 2. ASPECTS is 8. These results were communicated to Dr. Leonel Ramsay at [place] 7/22/2021by text page via the [facility] messaging system. Electronically Signed   By: Jeannine Boga M.D.   On: 03/27/2020 01:51    Pertinent labs & imaging results that were available during my care of the patient were reviewed by me and considered in my medical decision making (see chart for details).  Medications Ordered in ED Medications  alteplase (ACTIVASE) 1 mg/mL infusion  71 mg (71 mg Intravenous New Bag/Given 03/27/20 0151)    Followed by  0.9 %  sodium chloride infusion (has no administration in time range)  iohexol (OMNIPAQUE) 240 MG/ML injection (has no administration in time range)  aspirin 81 MG chewable tablet (has no administration in time range)  verapamil (ISOPTIN) 2.5 MG/ML injection (has no administration in time range)  ticagrelor (BRILINTA) 90 MG tablet (has no administration in time range)  clopidogrel (PLAVIX) 300 MG tablet (has no administration in time range)  nitroGLYCERIN 100 mcg/mL intra-arterial injection (has no administration in time range)  tirofiban (AGGRASTAT) 5-0.9 MG/100ML-% injection (has no administration in time range)  eptifibatide (INTEGRILIN) 20 MG/10ML injection (has no administration in time range)  iohexol (OMNIPAQUE) 350 MG/ML injection 80 mL (80 mLs Intravenous Contrast Given 03/27/20 0151)                                                                                                                                    Procedures .Critical Care Performed by: Fatima Blank, MD Authorized by: Fatima Blank, MD    CRITICAL CARE Performed by: Grayce Sessions Jessice Madill Total critical care time: 30 minutes Critical care time was exclusive of separately billable procedures and treating other patients. Critical care was necessary to treat or prevent imminent or life-threatening deterioration. Critical care was time spent personally by me on the following activities: development of treatment plan with patient and/or surrogate as well as nursing, discussions with consultants, evaluation of patient's response to treatment, examination of patient, obtaining history from patient or surrogate, ordering and performing treatments and interventions, ordering and review of laboratory studies, ordering and review of radiographic studies, pulse oximetry and re-evaluation of patient's condition.    (including critical care  time)  Medical Decision Making / ED Course I have  reviewed the nursing notes for this encounter and the patient's prior records (if available in EHR or on provided paperwork).   Ethan Fitzgerald was evaluated in Emergency Department on 03/27/2020 for the symptoms described in the history of present illness. He was evaluated in the context of the global COVID-19 pandemic, which necessitated consideration that the patient might be at risk for infection with the SARS-CoV-2 virus that causes COVID-19. Institutional protocols and algorithms that pertain to the evaluation of patients at risk for COVID-19 are in a state of rapid change based on information released by regulatory bodies including the CDC and federal and state organizations. These policies and algorithms were followed during the patient's care in the ED.  Patient taken immediately to CT scanner which revealed a right M1 occlusion.  Patient was a candidate for TPA which was initiated.  Patient was also taken to IR. Will be admitted to ICU.      Final Clinical Impression(s) / ED Diagnoses Final diagnoses:  Stroke [facility])      This chart was dictated using voice recognition software.  Despite best efforts to proofread,  errors can occur which can change the documentation meaning.   Fatima Blank, MD 03/27/20 [phone]

## 2020-03-28 ENCOUNTER — Inpatient Hospital Stay (HOSPITAL_COMMUNITY): Payer: Medicare HMO

## 2020-03-28 ENCOUNTER — Encounter (HOSPITAL_COMMUNITY): Payer: Self-pay | Admitting: Interventional Radiology

## 2020-03-28 DIAGNOSIS — I6601 Occlusion and stenosis of right middle cerebral artery: Secondary | ICD-10-CM

## 2020-03-28 LAB — BASIC METABOLIC PANEL
Anion gap: 7 (ref 5–15)
BUN: 8 mg/dL (ref 8–23)
CO2: 24 mmol/L (ref 22–32)
Calcium: 8.8 mg/dL — ABNORMAL LOW (ref 8.9–10.3)
Chloride: 110 mmol/L (ref 98–111)
Creatinine, Ser: 1.03 mg/dL (ref 0.61–1.24)
GFR calc Af Amer: >60 mL/min (ref >60)
GFR calc non Af Amer: >60 mL/min (ref >60)
Glucose, Bld: 94 mg/dL (ref 70–99)
Potassium: 3.9 mmol/L (ref 3.5–5.1)
Sodium: 141 mmol/L (ref 135–145)

## 2020-03-28 LAB — CBC WITH DIFFERENTIAL/PLATELET
Abs Immature Granulocytes: 0.1 10*3/uL — ABNORMAL HIGH (ref 0.00–0.07)
Basophils Absolute: 0.1 10*3/uL (ref 0.0–0.1)
Basophils Relative: 1 %
Eosinophils Absolute: 0.3 10*3/uL (ref 0.0–0.5)
Eosinophils Relative: 2 %
HCT: 40.8 % (ref 39.0–52.0)
Hemoglobin: 13.2 g/dL (ref 13.0–17.0)
Immature Granulocytes: 1 %
Lymphocytes Relative: 21 %
Lymphs Abs: 2.9 10*3/uL (ref 0.7–4.0)
MCH: 32.7 pg (ref 26.0–34.0)
MCHC: 32.4 g/dL (ref 30.0–36.0)
MCV: 101 fL — ABNORMAL HIGH (ref 80.0–100.0)
Monocytes Absolute: 1.3 10*3/uL — ABNORMAL HIGH (ref 0.1–1.0)
Monocytes Relative: 9 %
Neutro Abs: 9.6 10*3/uL — ABNORMAL HIGH (ref 1.7–7.7)
Neutrophils Relative %: 66 %
Platelets: 248 10*3/uL (ref 150–400)
RBC: 4.04 MIL/uL — ABNORMAL LOW (ref 4.22–5.81)
RDW: 13.6 % (ref 11.5–15.5)
WBC: 14.3 10*3/uL — ABNORMAL HIGH (ref 4.0–10.5)
nRBC: 0 % (ref 0.0–0.2)

## 2020-03-28 LAB — GLUCOSE, CAPILLARY
Glucose-Capillary: 100 mg/dL — ABNORMAL HIGH (ref 70–99)
Glucose-Capillary: 101 mg/dL — ABNORMAL HIGH (ref 70–99)
Glucose-Capillary: 72 mg/dL (ref 70–99)
Glucose-Capillary: 83 mg/dL (ref 70–99)
Glucose-Capillary: 83 mg/dL (ref 70–99)
Glucose-Capillary: 94 mg/dL (ref 70–99)

## 2020-03-28 MED ORDER — ASPIRIN 300 MG RE SUPP: 150.0000 mg | Freq: Every day | RECTAL | Status: DC

## 2020-03-28 MED ORDER — SODIUM CHLORIDE 0.9 % IV BOLUS
1000.0000 mL | Freq: Once | INTRAVENOUS | Status: AC
  Administered 2020-03-28: 1000 mL via INTRAVENOUS

## 2020-03-28 MED ORDER — ASPIRIN 81 MG PO CHEW
81.0000 mg | CHEWABLE_TABLET | Freq: Every day | ORAL | Status: DC
  Administered 2020-03-28 – 2020-03-29 (×2): 81 mg
  Filled 2020-03-28: qty 1

## 2020-03-28 MED ORDER — ASPIRIN 81 MG PO CHEW
CHEWABLE_TABLET | ORAL | Status: AC
  Filled 2020-03-28: qty 1

## 2020-03-28 MED ORDER — CHLORHEXIDINE GLUCONATE 0.12 % MT SOLN
15.0000 mL | Freq: Two times a day (BID) | OROMUCOSAL | Status: DC
  Administered 2020-03-28 – 2020-04-01 (×8): 15 mL via OROMUCOSAL
  Filled 2020-03-28 (×6): qty 15

## 2020-03-28 MED ORDER — INSULIN ASPART 100 UNIT/ML ~~LOC~~ SOLN: 0.0000 [IU] | SUBCUTANEOUS | Status: DC

## 2020-03-28 MED ORDER — ORAL CARE MOUTH RINSE
15.0000 mL | Freq: Two times a day (BID) | OROMUCOSAL | Status: DC
  Administered 2020-03-28 – 2020-03-30 (×4): 15 mL via OROMUCOSAL

## 2020-03-28 MED ORDER — SODIUM CHLORIDE 0.9 % IV BOLUS
500.0000 mL | Freq: Once | INTRAVENOUS | Status: AC
  Administered 2020-03-28: 500 mL via INTRAVENOUS

## 2020-03-28 NOTE — Progress Notes (Signed)
Referring Physician(s): Roland Rack  Supervising Physician: Luanne Bras  Patient Status:  [name] - In-pt  Chief Complaint: Follow up thrombectomy right MCA and angioplasty of proximal right ICA 7/22 with Dr. Estanislado Pandy  Subjective:  Patient awake, remains intubated but RN plan for extubation this AM. He follows all commands without any obvious deficits. He nods yes when asked if he has a headache and indicates it is all over his head.   Allergies: Patient has no known allergies.  Medications: Prior to Admission medications   Medication Sig Start Date End Date Taking? Authorizing Provider  albuterol (VENTOLIN HFA) 108 (90 Base) MCG/ACT inhaler Inhale 1-2 puffs into the lungs every 4 (four) hours as needed for wheezing or shortness of breath. 11/05/19  Yes Karamalegos, Devonne Doughty, DO  Aspirin-Acetaminophen-Caffeine (EXCEDRIN PO) Take 2 tablets by mouth. am   Yes [provider]  guaiFENesin-codeine (ROBITUSSIN AC) 100-10 MG/5ML syrup Take 5-10 mLs by mouth 3 (three) times daily as needed for cough. 03/21/20  Yes Karamalegos, Devonne Doughty, DO  Omega-3 Fatty Acids (FISH OIL) 1000 MG CAPS Take 2,000 mg by mouth in the morning and at bedtime.   Yes [provider]  predniSONE (DELTASONE) 50 MG tablet Take 1 tablet (50 mg total) by mouth daily with breakfast. 03/21/20  Yes Karamalegos, Devonne Doughty, DO  rosuvastatin (CRESTOR) 10 MG tablet Take 2 tablets (20 mg total) by mouth daily. 12/27/19  Yes Karamalegos, Devonne Doughty, DO  Tiotropium Bromide-Olodaterol (STIOLTO RESPIMAT) 2.5-2.5 MCG/ACT AERS Inhale 2 puffs into the lungs daily. 12/27/19  Yes Lauraine Rinne, NP  vitamin C (ASCORBIC ACID) 500 MG tablet Take 500 mg by mouth daily. Am   Yes [provider]  azithromycin (ZITHROMAX Z-PAK) 250 MG tablet Take 2 tabs (500mg  total) on Day 1. Take 1 tab (250mg ) daily for next 4 days. Patient not taking: Reported on 03/27/2020 03/21/20   Olin Hauser, DO    cyclobenzaprine (FLEXERIL) 10 MG tablet Take 1 tablet (10 mg total) by mouth 3 (three) times daily as needed for muscle spasms. Patient not taking: Reported on 03/27/2020 05/08/19   Olin Hauser, DO  naproxen (NAPROSYN) 500 MG tablet TAKE 1 TABLET (500 MG TOTAL) BY MOUTH 2 (TWO) TIMES DAILY WITH A MEAL. FOR 2-4 WEEKS THEN AS NEEDED Patient not taking: Reported on 03/27/2020 03/30/19   Olin Hauser, DO  sildenafil (REVATIO) 20 MG tablet TAKE 1 TO 5 TABLETS BY MOUTH ABOUT 30 MINUTES PRIOR TO SEX.START WITH 1 THEN INCREASE Patient not taking: Reported on 03/27/2020 10/23/19   Olin Hauser, DO     Vital Signs: BP 123/81   Pulse 83   Temp 98 F (36.7 C) (Axillary)   Resp 13   Ht 6' (1.829 m)   Wt 174 lb (78.9 kg)   SpO2 100%   BMI 23.60 kg/m   Physical Exam Vitals and nursing note reviewed.  HENT:     Head: Normocephalic.  Cardiovascular:     Rate and Rhythm: Normal rate and regular rhythm.     Comments: (+) right CFA puncture site clean, dry, dressed appropriately. Soft, non tender, non pulsatile. No active bleeding or drainage. Pulmonary:     Effort: Pulmonary effort is normal.     Comments: Intubated, not ventilated or sedated Skin:    General: Skin is warm and dry.  Neurological:     Mental Status: He is alert.   Alert, awake Speech and comprehension - unable to speak 2/2 ETT in  place, however comprehension appears to be fully in tact and follows all commands appropriately.  PERRL bilaterally EOMs without nystagmus or subjective diplopia. Visual fields grossly intact ? Left sided facial droop (unable to accurately assess due to presence of ETT). Tongue midline Motor power full in all 4 extremities, no obvious weakness but right upper extremity still in soft restraints so is incompletely assessed. Pronator drift - not assessed. Fine motor and coordination - grossly in tact Gait - not assessed Romberg - not assessed Heel to toe - not  assessed Distal pulses - palpable bilaterally   Imaging: CT Code Stroke CTA Head W/WO contrast  Result Date: 03/27/2020 CLINICAL DATA:  Initial evaluation for acute left-sided weakness. EXAM: CT ANGIOGRAPHY HEAD AND NECK TECHNIQUE: Multidetector CT imaging of the head and neck was performed using the standard protocol during bolus administration of intravenous contrast. Multiplanar CT image reconstructions and MIPs were obtained to evaluate the vascular anatomy. Carotid stenosis measurements (when applicable) are obtained utilizing NASCET criteria, using the distal internal carotid diameter as the denominator. CONTRAST:  83mL OMNIPAQUE IOHEXOL 350 MG/ML SOLN COMPARISON:  Prior head CT from earlier same day. FINDINGS: CTA NECK FINDINGS Aortic arch: Visualized aortic arch of normal caliber with normal 3 vessel morphology. Atheromatous change at the origin of the left subclavian artery without flow-limiting stenosis. Right carotid system: Right common carotid artery patent from its origin to the bifurcation without stenosis. There is abrupt occlusion of the right ICA at its origin at the right carotid bifurcation. Right ICA remains occluded within the neck. Right external carotid artery and its branches remain patent and well perfused. Left carotid system: Left common carotid artery patent from its origin to the bifurcation without stenosis. Atheromatous change at the left bifurcation with associated abrupt occlusion of the left ICA at its origin. Left ICA remains occluded within the neck. Left external carotid artery and its branches remain well perfused and patent. Vertebral arteries: Both vertebral arteries arise from the subclavian arteries. Right vertebral artery dominant. Vertebral arteries widely patent within the neck without stenosis, dissection, or occlusion. Skeleton: No definite acute osseous abnormality identified. No discrete lytic or blastic osseous lesions. Patient is edentulous. Other neck: No  other acute soft tissue abnormality within the neck. No mass lesion or adenopathy. Upper chest: Visualized upper chest demonstrates no acute finding. Moderate emphysematous changes noted. Review of the MIP images confirms the above findings CTA HEAD FINDINGS Anterior circulation: Both internal carotid arteries occluded at the skull base. Distal reconstitution at the cavernous segments bilaterally likely via of collateral flow from the posterior circulation or possibly external carotid branches. Scattered atheromatous change within the carotid siphons bilaterally. No high-grade stenosis on the right. On the left, there is a focal severe stenosis involving the cavernous left ICA (series 6, image 112). ICA termini patent bilaterally. A1 segments patent. Normal anterior communicating artery complex. Both anterior cerebral arteries remain patent and well perfused. Occlusive thrombus seen within the proximal left M1 segment, corresponding with hyperdense M1 seen on prior CT (series 6, image 98). Small anterior temporal branch appears to remain patent. Additional scattered flow seen within right MCA branches distally, likely collateral nature, overall moderately robust in appearance. Left M1 widely patent. Normal left MCA bifurcation. Distal left MCA branches well perfused and patent. Posterior circulation: Both vertebral arteries are widely patent to the vertebrobasilar junction. Neither PICA well visualized. Basilar mildly tortuous but widely patent to its distal aspect without stenosis. Superior cerebral arteries patent bilaterally. Both PCAs primarily supplied via  the basilar and are well perfused to their distal aspects. Probable small bilateral posterior communicating arteries noted. Venous sinuses: Grossly patent allowing for timing the contrast bolus. Anatomic variants: Dominant right vertebral artery. No intracranial aneurysm. Review of the MIP images confirms the above findings IMPRESSION: 1. Positive CTA for  LVO, with abrupt occlusion of the right ICA at its origin at the right bifurcation. Distal reconstitution at the cavernous segment with subsequent reocclusion at the proximal right M1 segment. Moderately robust collateralization evident within the right MCA territory. 2. Abrupt occlusion of the left ICA at its origin at the left bifurcation, age indeterminate. Distal reconstitution at the cavernous segment with good perfusion of the left MCA and both ACAs. Superimposed short-segment severe cavernous left ICA stenosis as above. 3. Wide patency of the posterior circulation. Right vertebral artery dominant. 4.  Emphysema (ICD10-J43.9). These results were communicated to Dr. Leonel Ramsay at [place] 7/22/2021by text page via the [facility] messaging system. Electronically Signed   By: Jeannine Boga M.D.   On: 03/27/2020 02:20   CT HEAD WO CONTRAST  Result Date: 03/28/2020 CLINICAL DATA:  Stroke follow-up. Right ICA and MCA occlusion status post partial endovascular revascularization. EXAM: CT HEAD WITHOUT CONTRAST TECHNIQUE: Contiguous axial images were obtained from the base of the skull through the vertex without intravenous contrast. COMPARISON:  Head MRI 03/27/2020 FINDINGS: Brain: A moderate-sized acute right MCA infarct centered in the right frontal operculum is unchanged in size from yesterday's MRI. There is well-defined cytotoxic edema with mild regional mass effect but no significant midline shift. Hyperdensity within regional sulci, right sylvian fissure, and interpeduncular cistern may reflect a combination of subarachnoid hemorrhage and contrast, also similar in distribution to the prior MRI. There may be a small amount of parenchymal petechial hemorrhage, however no sizable parenchymal hematoma is evident. The ventricles are normal in size. There is no extra-axial fluid collection. Vascular: Calcified atherosclerosis at the skull base. Skull: No fracture or suspicious osseous lesion. Sinuses/Orbits:  Mild bilateral ethmoid air cell mucosal thickening. Small volume bubbly secretions in the left sphenoid sinus. Clear mastoid air cells. Unremarkable orbits. Other: None. IMPRESSION: Unchanged size of acute right MCA infarct with similar distribution of associated subarachnoid hemorrhage and/or contrast. Electronically Signed   By: Logan Bores M.D.   On: 03/28/2020 06:37   CT Code Stroke CTA Neck W/WO contrast  Result Date: 03/27/2020 CLINICAL DATA:  Initial evaluation for acute left-sided weakness. EXAM: CT ANGIOGRAPHY HEAD AND NECK TECHNIQUE: Multidetector CT imaging of the head and neck was performed using the standard protocol during bolus administration of intravenous contrast. Multiplanar CT image reconstructions and MIPs were obtained to evaluate the vascular anatomy. Carotid stenosis measurements (when applicable) are obtained utilizing NASCET criteria, using the distal internal carotid diameter as the denominator. CONTRAST:  47mL OMNIPAQUE IOHEXOL 350 MG/ML SOLN COMPARISON:  Prior head CT from earlier same day. FINDINGS: CTA NECK FINDINGS Aortic arch: Visualized aortic arch of normal caliber with normal 3 vessel morphology. Atheromatous change at the origin of the left subclavian artery without flow-limiting stenosis. Right carotid system: Right common carotid artery patent from its origin to the bifurcation without stenosis. There is abrupt occlusion of the right ICA at its origin at the right carotid bifurcation. Right ICA remains occluded within the neck. Right external carotid artery and its branches remain patent and well perfused. Left carotid system: Left common carotid artery patent from its origin to the bifurcation without stenosis. Atheromatous change at the left bifurcation with associated abrupt occlusion of  the left ICA at its origin. Left ICA remains occluded within the neck. Left external carotid artery and its branches remain well perfused and patent. Vertebral arteries: Both vertebral  arteries arise from the subclavian arteries. Right vertebral artery dominant. Vertebral arteries widely patent within the neck without stenosis, dissection, or occlusion. Skeleton: No definite acute osseous abnormality identified. No discrete lytic or blastic osseous lesions. Patient is edentulous. Other neck: No other acute soft tissue abnormality within the neck. No mass lesion or adenopathy. Upper chest: Visualized upper chest demonstrates no acute finding. Moderate emphysematous changes noted. Review of the MIP images confirms the above findings CTA HEAD FINDINGS Anterior circulation: Both internal carotid arteries occluded at the skull base. Distal reconstitution at the cavernous segments bilaterally likely via of collateral flow from the posterior circulation or possibly external carotid branches. Scattered atheromatous change within the carotid siphons bilaterally. No high-grade stenosis on the right. On the left, there is a focal severe stenosis involving the cavernous left ICA (series 6, image 112). ICA termini patent bilaterally. A1 segments patent. Normal anterior communicating artery complex. Both anterior cerebral arteries remain patent and well perfused. Occlusive thrombus seen within the proximal left M1 segment, corresponding with hyperdense M1 seen on prior CT (series 6, image 98). Small anterior temporal branch appears to remain patent. Additional scattered flow seen within right MCA branches distally, likely collateral nature, overall moderately robust in appearance. Left M1 widely patent. Normal left MCA bifurcation. Distal left MCA branches well perfused and patent. Posterior circulation: Both vertebral arteries are widely patent to the vertebrobasilar junction. Neither PICA well visualized. Basilar mildly tortuous but widely patent to its distal aspect without stenosis. Superior cerebral arteries patent bilaterally. Both PCAs primarily supplied via the basilar and are well perfused to their  distal aspects. Probable small bilateral posterior communicating arteries noted. Venous sinuses: Grossly patent allowing for timing the contrast bolus. Anatomic variants: Dominant right vertebral artery. No intracranial aneurysm. Review of the MIP images confirms the above findings IMPRESSION: 1. Positive CTA for LVO, with abrupt occlusion of the right ICA at its origin at the right bifurcation. Distal reconstitution at the cavernous segment with subsequent reocclusion at the proximal right M1 segment. Moderately robust collateralization evident within the right MCA territory. 2. Abrupt occlusion of the left ICA at its origin at the left bifurcation, age indeterminate. Distal reconstitution at the cavernous segment with good perfusion of the left MCA and both ACAs. Superimposed short-segment severe cavernous left ICA stenosis as above. 3. Wide patency of the posterior circulation. Right vertebral artery dominant. 4.  Emphysema (ICD10-J43.9). These results were communicated to Dr. Leonel Ramsay at [place] 7/22/2021by text page via the [facility] messaging system. Electronically Signed   By: Jeannine Boga M.D.   On: 03/27/2020 02:20   MR BRAIN WO CONTRAST  Addendum Date: 03/27/2020   ADDENDUM REPORT: 03/27/2020 16:06 ADDENDUM: Findings of subarachnoid hemorrhage and right MCA acute infarct called by telephone at the time of interpretation on 03/27/2020 at 4:06 pm to provider Dr. Leonie Man, who verbally acknowledged these results. Electronically Signed   By: Kellie Simmering DO   On: 03/27/2020 16:06   Result Date: 03/27/2020 CLINICAL DATA:  Stroke, follow-up. EXAM: MRI HEAD WITHOUT CONTRAST TECHNIQUE: Multiplanar, multiecho pulse sequences of the brain and surrounding structures were obtained without intravenous contrast. COMPARISON:  CT angiogram head/neck 03/27/2020, noncontrast head CT 03/27/2020 FINDINGS: Brain: There is cortical/subcortical restricted diffusion consistent with acute infarction within the right MCA  vascular territory. Most notably, there is a confluent  region of infarction within the posterolateral right frontal lobe, right frontal operculum, right insula and subtly involving portions of the superomedial right temporal lobe. This region of confluent infarction approximately 5.4 x 3.7 x 5.2 cm (AP x TV x CC). Additional small acute infarcts within the right caudate nucleus, right pre and postcentral gyri and mid to anterior left frontal lobe. Corresponding T2/FLAIR hyperintensity at these sites. There are small foci of T2* signal loss within the right sylvian fissure which may reflect subtle petechial hemorrhage and/or endoluminal thrombus (series 8, images 9-12). There is T2/FLAIR hyperintensity within the interpeduncular cistern, right MCA cistern, right sylvian fissure, and scattered along the right greater than left cerebral convexities likely reflecting subarachnoid hemorrhage. There is no evidence of hydrocephalus or midline shift. Background mild generalized parenchymal atrophy and mild cerebral white matter chronic small vessel ischemic disease. Vascular: Signal abnormality within the ICA siphons bilaterally suspicious for persistent occlusion or high-grade stenosis. T2 star signal loss within the right sylvian fissure suspicious for endoluminal thrombus within right M2 vessels. Skull and upper cervical spine: No focal marrow lesion. Sinuses/Orbits: Visualized orbits show no acute finding. Mild paranasal sinus mucosal thickening at the imaged levels, most notably ethmoidal. No significant mastoid effusion. Other: Incidentally noted T2 hyperintense lesion within the medial right parotid gland measuring 11 mm. IMPRESSION: 5.4 x 3.7 x 5.2 cm cortical/subcortical acute right MCA infarct centered within the posterolateral right frontal lobe, right frontal operculum, right insula and subtly involving portions of the superomedial right temporal lobe. Possible mild petechial hemorrhage. No significant mass  effect at this time. Additional small acute infarcts within the right caudate nucleus, right pre and postcentral gyri and mid to anterior left frontal lobe. T2* signal loss within the right sylvian fissure suspicious for residual endoluminal thrombus. Additionally, there is persistent signal abnormality within the ICA siphons bilaterally likely reflecting persistent high-grade stenosis or occlusion. FLAIR hyperintensity within the interpeduncular cistern, right MCA cistern, right sylvian fissure and overlying the right greater than left cerebral hemispheres likely reflecting subarachnoid hemorrhage. No hydrocephalus. Background mild generalized parenchymal atrophy and chronic small vessel ischemic disease. Incidentally noted 11 mm T2 hyperintense lesion within the medial right parotid gland suspicious for primary parotid neoplasm. Consider ENT consultation when clinically appropriate. Electronically Signed: By: Kellie Simmering DO On: 03/27/2020 15:47   Portable Chest x-ray  Result Date: 03/27/2020 CLINICAL DATA:  Intubation. EXAM: PORTABLE CHEST 1 VIEW COMPARISON:  12/04/2019. FINDINGS: Endotracheal tube noted with its tip 5 cm above the carina. NG tube noted with its tip coiled stomach. Heart size normal. Low lung volumes with bibasilar. Right costophrenic angle incompletely imaged. No pleural effusion noted. No pneumothorax. IMPRESSION: 1. Endotracheal tube noted with tip 5 cm above the carina. NG tube noted with tip coiled stomach. 2.  Low lung volumes with bibasilar atelectasis. Electronically Signed   By: Marcello Moores  Register   On: 03/27/2020 07:20   ECHOCARDIOGRAM COMPLETE  Result Date: 03/27/2020    ECHOCARDIOGRAM REPORT   Patient Name:   Ethan Fitzgerald Date of Exam: 03/27/2020 Medical Rec #:  [id number]      Height:       72.0 in Accession #:    [id number]     Weight:       174.0 lb Date of Birth:  [birth year]      BSA:          2.008 m Patient Age:    30 years       BP:  121/96 mmHg Patient Gender: M               HR:           75 bpm. Exam Location:  Inpatient Procedure: 2D Echo Indications:    Stroke 434.91 / I163.9  History:        Patient has no prior history of Echocardiogram examinations.                 COPD; Risk Factors:Current Smoker. R MCA revascularization this                 morning.  Sonographer:    Darlina Sicilian RDCS Referring Phys: [id number] MCNEILL P KIRKPATRICK  Sonographer Comments: Suboptimal parasternal window, suboptimal apical window and echo performed with patient supine and on artificial respirator. IMPRESSIONS  1. Technically difficult study with limited views. Left ventricular ejection fraction, by estimation, is grossly 55 to 60%. The left ventricle has normal function. Left ventricular diastolic parameters were normal.  2. Right ventricular systolic function is normal. The right ventricular size is normal. Tricuspid regurgitation signal is inadequate for assessing PA pressure.  3. The mitral valve is normal in structure. No evidence of mitral valve regurgitation.  4. The aortic valve was not well visualized. Aortic valve regurgitation is not visualized. No aortic stenosis is present. FINDINGS  Left Ventricle: Left ventricular ejection fraction, by estimation, is 55 to 60%. The left ventricle has normal function. The left ventricle has no regional wall motion abnormalities. The left ventricular internal cavity size was normal in size. There is  no left ventricular hypertrophy. Left ventricular diastolic parameters were normal. Right Ventricle: The right ventricular size is normal. No increase in right ventricular wall thickness. Right ventricular systolic function is normal. Tricuspid regurgitation signal is inadequate for assessing PA pressure. Left Atrium: Left atrial size was normal in size. Right Atrium: Right atrial size was normal in size. Pericardium: There is no evidence of pericardial effusion. Mitral Valve: The mitral valve is normal in structure. No evidence of mitral valve  regurgitation. Tricuspid Valve: The tricuspid valve is normal in structure. Tricuspid valve regurgitation is trivial. Aortic Valve: The aortic valve was not well visualized. Aortic valve regurgitation is not visualized. No aortic stenosis is present. Pulmonic Valve: The pulmonic valve was not well visualized. Pulmonic valve regurgitation is not visualized. Aorta: The aortic root is normal in size and structure. IAS/Shunts: The interatrial septum was not well visualized.  LEFT VENTRICLE PLAX 2D LVIDd:         4.40 cm  Diastology LVIDs:         3.50 cm  LV e' lateral:   8.70 cm/s LV PW:         0.70 cm  LV E/e' lateral: 6.5 LV IVS:        0.80 cm  LV e' medial:    8.70 cm/s LVOT diam:     2.30 cm  LV E/e' medial:  6.5 LV SV:         54 LV SV Index:   27 LVOT Area:     4.15 cm  LEFT ATRIUM             Index LA diam:        2.50 cm 1.24 cm/m LA Vol (A2C):   88.7 ml 44.17 ml/m LA Vol (A4C):   52.4 ml 26.09 ml/m LA Biplane Vol: 66.4 ml 33.07 ml/m  AORTIC VALVE LVOT Vmax:   52.70 cm/s LVOT Vmean:  35.600 cm/s  LVOT VTI:    0.131 m  AORTA Ao Root diam: 3.30 cm MITRAL VALVE MV Area (PHT): 2.62 cm    SHUNTS MV Decel Time: 289 msec    Systemic VTI:  0.13 m MV E velocity: 56.60 cm/s  Systemic Diam: 2.30 cm MV A velocity: 39.80 cm/s MV E/A ratio:  1.42 Oswaldo Milian MD Electronically signed by Oswaldo Milian MD Signature Date/Time: 03/27/2020/2:43:13 PM    Final    CT HEAD CODE STROKE WO CONTRAST  Result Date: 03/27/2020 CLINICAL DATA:  Code stroke. Initial evaluation for acute left-sided weakness. EXAM: CT HEAD WITHOUT CONTRAST TECHNIQUE: Contiguous axial images were obtained from the base of the skull through the vertex without intravenous contrast. COMPARISON:  None available. FINDINGS: Brain: Generalized age-related cerebral atrophy. No acute intracranial hemorrhage. There is subtle asymmetric hypodensity involving the right caudate and lentiform nuclei, suspicious for early developing right MCA  territory ischemia. Gray-white matter differentiation otherwise grossly maintained elsewhere within the brain. No mass lesion, midline shift or mass effect. No hydrocephalus or extra-axial fluid collection. Vascular: Dense right M1 segment concerning for thrombus and LVO. Skull: Scalp soft tissues within normal limits.  Calvarium intact. Sinuses/Orbits: Globes and orbital soft tissues within normal limits. Chronic mucoperiosteal thickening noted within the ethmoidal air cells. Paranasal sinuses are otherwise clear. No mastoid effusion. Other: None. ASPECTS [facility] Stroke Program Early CT Score) - Ganglionic level infarction (caudate, lentiform nuclei, internal capsule, insula, M1-M3 cortex): 5 - Supraganglionic infarction (M4-M6 cortex): 3 Total score (0-10 with 10 being normal): 8 IMPRESSION: 1. Dense right M1 segment, concerning for thrombus/LVO. Subtle asymmetric hypodensity involving the right caudate and lentiform nuclei concerning for early ischemic changes. No intracranial hemorrhage. 2. ASPECTS is 8. These results were communicated to Dr. Leonel Ramsay at [place] 7/22/2021by text page via the [facility] messaging system. Electronically Signed   By: Jeannine Boga M.D.   On: 03/27/2020 01:51    Labs:  CBC: Recent Labs    03/27/20 0130 03/27/20 0130 03/27/20 0143 03/27/20 0823 03/27/20 0832 03/28/20 0820  WBC 9.0  --   --  16.3*  --  14.3*  HGB 13.2   < > 13.3 15.9 15.3 13.2  HCT 39.9   < > 39.0 47.0 45.0 40.8  PLT 245  --   --  269  --  248   < > = values in this interval not displayed.    COAGS: Recent Labs    03/27/20 0130  INR 1.0  APTT 24    BMP: Recent Labs    10/29/19 0812 10/29/19 0812 03/27/20 0130 03/27/20 0143 03/27/20 0832 03/28/20 0820  NA 139   < > 140 143 139 141  K 4.4   < > 3.3* 3.1* 4.7 3.9  CL 105  --  112* 110  --  110  CO2 26  --  17*  --   --  24  GLUCOSE 142*  --  80 74  --  94  BUN 13  --  15 18  --  8  CALCIUM 9.3  --  7.6*  --   --  8.8*    CREATININE 0.99  --  0.80 0.70  --  1.03  GFRNONAA 80  --  >60  --   --  >60  GFRAA 92  --  >60  --   --  >60   < > = values in this interval not displayed.    LIVER FUNCTION TESTS: Recent Labs    10/29/19 9381  03/27/20 0130  BILITOT 1.0 0.8  AST 16 19  ALT 12 12  ALKPHOS  --  49  PROT 6.4 5.0*  ALBUMIN  --  2.9*    Assessment and Plan:  64 y/o M who presented to [facility] ED early AM of 7/22 with complaints of left sided weakness, left facial droop and dysarthria. Imaging showed bilateral carotid artery occlusions and right MCA occlusion with chronic left ICA occlusion. He was brought to [facility] that same morning and underwent thrombectomy with partial revascularization of the right MCA dominant division as well as balloon angioplasty of the acutely occluded proximal right ICA with re-occlusion. Robust collaterals from both PCAs P3 segment and PCOMs to reconstitute the right MCA distrubution. Post CT brain showed mild to moderate right peri-insular and post frontal convexity contrast staining. MRI brain w/o contrast later that day showed SAH and right MCA infarct.   Ethan Fitzgerald is alert on exam today, follows all commands appropriately. He remains intubated without vent/sedation with plans for extubation this morning per RN. Moving all 4 extremities without difficult, strength is full. ? Left sided facial dropping - not completely able to assess due to presence of ETT so will re-evaluate later today vs tomorrow AM once extubated.  Plans per neurology, appreciate and agree with their management plan - IR will continue to follow. Please call with any questions or concerns.   Electronically Signed: Joaquim Nam, PA-C 03/28/2020, 9:33 AM   I spent a total of 15 Minutes at the the patient's bedside AND on the patient's hospital floor or unit, greater than 50% of which was counseling/coordinating care for right MCA thrombectomy and right ICA angioplasty.

## 2020-03-28 NOTE — Evaluation (Signed)
Physical Therapy Evaluation Patient Details Name: Ethan Fitzgerald MRN: [id number] DOB: February 22, 1956 Today's Date: 03/28/2020   History of Present Illness  64 year old male with COPD, who came with acute right MCA stroke status post TPA and thrombectomy of bilateral ICA and right M1.  Clinical Impression  Patient presents with L side weakness and limited mobility due to bedrest limitations.  Moves in bed well with S and cues, but does demonstrate dysarthria and some initial cognitive deficits.  He will benefit from skilled PT in the acute setting and should hopefully progress to home with HHPT follow up depending on progress with OOB mobility.  PT to follow up when stable for OOB.     Follow Up Recommendations Home health PT;Supervision/Assistance - 24 hour    Equipment Recommendations  Other (comment) (TBA)    Recommendations for Other Services       Precautions / Restrictions Precautions Precautions: Fall Precaution Comments: watch BP SBP 130-160      Mobility  Bed Mobility Overal bed mobility: Needs Assistance Bed Mobility: Rolling Rolling: Supervision         General bed mobility comments: increased time and use of rails to roll wtih cues due to lines; no mobility to EOB or OOB per MD  Transfers                    Ambulation/Gait                Stairs            Wheelchair Mobility    Modified Rankin (Stroke Patients Only) Modified Rankin (Stroke Patients Only) Pre-Morbid Rankin Score: No symptoms Modified Rankin: Moderately severe disability     Balance                                             Pertinent Vitals/Pain Pain Assessment: Faces Faces Pain Scale: Hurts little more Pain Location: R groin with movement Pain Descriptors / Indicators: Sore Pain Intervention(s): Monitored during session;Repositioned    Home Living Family/patient expects to be discharged to:: Private residence Living Arrangements:  Spouse/significant other Available Help at Discharge: Family Type of Home: House Home Access: Stairs to enter   Technical brewer of Steps: 1 Home Layout: One level Home Equipment: Shower seat      Prior Function Level of Independence: Independent         Comments: working in Multimedia programmer        Extremity/Trunk Assessment   Upper Extremity Assessment Upper Extremity Assessment: LUE deficits/detail LUE Deficits / Details: AROM WFL,strength 3/5 shoulder flexion (+drift), elbow flexion 3+/5, weak grip compared to R    Lower Extremity Assessment Lower Extremity Assessment: LLE deficits/detail LLE Deficits / Details: AROM WFL strength grossly 4/5       Communication   Communication: Expressive difficulties (dysarthria)  Cognition Arousal/Alertness: Awake/alert Behavior During Therapy: WFL for tasks assessed/performed Overall Cognitive Status: Impaired/Different from baseline Area of Impairment: Following commands;Problem solving                       Following Commands: Follows one step commands consistently;Follows one step commands with increased time     Problem Solving: Slow processing        General Comments General comments (skin integrity, edema, etc.): SBP initially 111; after intiating bed level eval  122, RN aware    Exercises     Assessment/Plan    PT Assessment Patient needs continued PT services  PT Problem List Decreased strength;Decreased mobility;Decreased balance;Decreased knowledge of use of DME;Decreased cognition       PT Treatment Interventions DME instruction;Therapeutic activities;Gait training;Therapeutic exercise;Patient/family education;Cognitive remediation;Balance training;Functional mobility training    PT Goals (Current goals can be found in the Care Plan section)  Acute Rehab PT Goals Patient Stated Goal: to go home PT Goal Formulation: With patient/family Time For Goal Achievement:  04/11/20 Potential to Achieve Goals: Good    Frequency Min 4X/week   Barriers to discharge        Co-evaluation               AM-PAC PT "6 Clicks" Mobility  Outcome Measure Help needed turning from your back to your side while in a flat bed without using bedrails?: None Help needed moving from lying on your back to sitting on the side of a flat bed without using bedrails?: Total Help needed moving to and from a bed to a chair (including a wheelchair)?: Total Help needed standing up from a chair using your arms (e.g., wheelchair or bedside chair)?: Total Help needed to walk in hospital room?: Total Help needed climbing 3-5 steps with a railing? : Total 6 Click Score: 9    End of Session   Activity Tolerance: Patient tolerated treatment well Patient left: in bed;with call bell/phone within reach;with family/visitor present   PT Visit Diagnosis: Hemiplegia and hemiparesis;Other symptoms and signs involving the nervous system (R29.898);Other abnormalities of gait and mobility (R26.89) Hemiplegia - Right/Left: Left Hemiplegia - dominant/non-dominant: Non-dominant Hemiplegia - caused by: Cerebral infarction    Time: 1155-1209 PT Time Calculation (min) (ACUTE ONLY): 14 min   Charges:   PT Evaluation $PT Eval Moderate Complexity: 1 Mod          Ethan Fitzgerald, PT Acute Rehabilitation Services NLZJQ:[phone] Office:[phone] 03/28/2020   Ethan Fitzgerald 03/28/2020, 4:12 PM

## 2020-03-28 NOTE — Progress Notes (Addendum)
NAME:  Ethan Fitzgerald, MRN:  [id number], DOB:  07-Feb-1956, LOS: 1 ADMISSION DATE:  03/27/2020, CONSULTATION DATE:  7/22 REFERRING MD:  Dr. Leonel Ramsay , CHIEF COMPLAINT:  CVA   Brief History   63 year old male admitted with R MCA CVA and taken to IR for revascularization. Post-procedure he remained on vent and transferred to ICU for recovery.   Past Medical History   has a past medical history of COPD (chronic obstructive pulmonary disease) ([place]), Erectile dysfunction, Headache, Knee pain, left, Seasonal allergies, and Tobacco dependence.   Significant Hospital Events   7/22 admit with CVA. TPA, IR for mechanical thrombectomy.   Consults:  IR PCCM  Procedures:  ETT 7/22 >  Significant Diagnostic Tests:  CT head 7/22 >> Dense right M1 segment, concerning for thrombus/LVO. No intracranial Hemorrhage. CTA head /neck 7/22> > Positive CTA for LVO, with abrupt occlusion of the right ICA at its origin at the right bifurcation. Distal reconstitution at the cavernous segment with subsequent reocclusion at the proximal right M1 segment. Moderately robust collateralization evident within the right MCA territory. Abrupt occlusion of the left ICA at its origin at the left bifurcation, age indeterminate. Distal reconstitution at the cavernous segment with good perfusion of the left MCA and both ACAs. Superimposed short-segment severe cavernous left ICA stenosis as above. 7/22 brain MRI>>cortical and subcortical restricted diffusion consistent with acute infarct in MCA territory involving posterolateral right frontal lobe, right frontal operculum, right insula and superomedial right temporal. Additional small infarcts within right caudate nucleus, right pre and post central gyri and mid to anterior left frontal lobe. Hyperintensity within interpeduncular cistern, right MCA cistern, right sylvian fissure, and left cerebral convexities likely reflecting subarachnoid hemorrhage. Signal abnormality within ICA  siphons bilaterally suspicious for persistent occlusion vs high grade stenosis. Incidentally noted hyperintense lesion within medial right parotid gland measuring 44mm 7/23 CT head>>unchanged size of acute right MCA infarct with associated subarachnoid hemorrhage. Mild regional mass effect but no significant midline shift  7/22 echocardiogram>>unremarkable  Micro Data:  n/a  Antimicrobials:  n/a   Interim history/subjective:  Awake and following commands this morning. Responding appropriately to questions. Denies pain anywhere. Head CT this morning is not showing progression of hemorrhage and no midline shift.  Objective   Blood pressure (!) 137/81, pulse 47, temperature 98.5 F (36.9 C), temperature source Oral, resp. rate (!) 24, height 6' (1.829 m), weight 78.9 kg, SpO2 100 %.    Vent Mode: PRVC FiO2 (%):  [40 %-60 %] 40 % Set Rate:  [18 bmp-24 bmp] 24 bmp Vt Set:  [620 mL] 620 mL PEEP:  [5 cmH20] 5 cmH20 Plateau Pressure:  [12 cmH20-15 cmH20] 12 cmH20   Intake/Output Summary (Last 24 hours) at 03/28/2020 0736 Last data filed at 03/28/2020 0600 Gross per 24 hour  Intake 3115.91 ml  Output 2900 ml  Net 215.91 ml   Filed Weights   03/27/20 0100  Weight: 78.9 kg    Examination: General: NAD HEENT: ETT. No significant secretions CV: RRR. Extremities warm. No LE edema Pulm: taking good breaths on pressure support ~2566mL.  Lungs clear Abd: soft, nontender. Hypoactive bs Neuro: awake. Responds appropriately to questions. Follows commands. PERRL. No overt facial asymmetry. Good cough reflex. 5/5 strength in right upper and bilateral lower extremities. 1/5 strength on left handgrip and bicep strength.   Resolved Hospital Problem list   n/a  Assessment & Plan:   R MCA CVA: s/p TPA in ED and mechanical thrombectomy 7/22. No midline  shift on head CT this morning. No mural thrombus noted on echocardiogram. Plan: management per neurology --blood pressure management with  cleviprex and neo to maintain SBPs 140-170mmHg --statin and aspirin. --neurochecks --speech eval 2h post extubation  Acute hypoxemic respiratory failure requiring mechanical ventilation 2/2 above. Switched to pressure support and doing well so far this morning on that. Pulling 2000-3012mL TVs.  Plan: extubate today. Wean off propofol. VAP bundle.  Hypokalemia. Recheck BMP this morning.  Prediabetes. A1C 5.7. glucose 72 this morning. Likely attributable to NPO. No insulin administered overnight. Suspect glucoses to improve when able to eat however will need speech eval first. Will switch him to very sensitive SSI and continue to monitor closely.  Right parotid gland mass. Will need further workup following discharge. ENT referral at discharge.  COPD without acute exacerbation. Recent treatment for exacerbation 7/16-7/21. Continue duonebs Best practice:  Diet: NPO Pain/Anxiety/Delirium protocol (if indicated): Propfol VAP protocol (if indicated): Per protocol DVT prophylaxis: Per primary GI prophylaxis: PPI Glucose control: SSI Mobility: BR Code Status: FULL Family Communication: per primary Disposition: ICU  Labs   CBC: Recent Labs  Lab 03/27/20 0130 03/27/20 0143 03/27/20 0823 03/27/20 0832  WBC 9.0  --  16.3*  --   NEUTROABS 6.0  --   --   --   HGB 13.2 13.3 15.9 15.3  HCT 39.9 39.0 47.0 45.0  MCV 98.5  --  98.5  --   PLT 245  --  269  --     Basic Metabolic Panel: Recent Labs  Lab 03/27/20 0130 03/27/20 0143 03/27/20 0832  NA 140 143 139  K 3.3* 3.1* 4.7  CL 112* 110  --   CO2 17*  --   --   GLUCOSE 80 74  --   BUN 15 18  --   CREATININE 0.80 0.70  --   CALCIUM 7.6*  --   --    GFR: Estimated Creatinine Clearance: 101 mL/min (by C-G formula based on SCr of 0.7 mg/dL). Recent Labs  Lab 03/27/20 0130 03/27/20 0823  WBC 9.0 16.3*    Liver Function Tests: Recent Labs  Lab 03/27/20 0130  AST 19  ALT 12  ALKPHOS 49  BILITOT 0.8  PROT 5.0*    ALBUMIN 2.9*   No results for input(s): LIPASE, AMYLASE in the last 168 hours. No results for input(s): AMMONIA in the last 168 hours.  ABG    Component Value Date/Time   PHART 7.296 (L) 03/27/2020 0832   PCO2ART 49.9 (H) 03/27/2020 0832   PO2ART 185 (H) 03/27/2020 0832   HCO3 24.4 03/27/2020 0832   TCO2 26 03/27/2020 0832   ACIDBASEDEF 3.0 (H) 03/27/2020 0832   O2SAT 99.0 03/27/2020 0832     Coagulation Profile: Recent Labs  Lab 03/27/20 0130  INR 1.0    Cardiac Enzymes: No results for input(s): CKTOTAL, CKMB, CKMBINDEX, TROPONINI in the last 168 hours.  HbA1C: Hgb A1c MFr Bld  Date/Time Value Ref Range Status  03/27/2020 08:23 AM 5.7 (H) 4.8 - 5.6 % Final    Comment:    (NOTE) Pre diabetes:          5.7%-6.4%  Diabetes:              >6.4%  Glycemic control for   <7.0% adults with diabetes   10/29/2019 08:12 AM 5.6 <5.7 % of total Hgb Final    Comment:    For the purpose of screening for the presence of diabetes: . <5.7%  Consistent with the absence of diabetes 5.7-6.4%    Consistent with increased risk for diabetes             (prediabetes) > or =6.5%  Consistent with diabetes . This assay result is consistent with a decreased risk of diabetes. . Currently, no consensus exists regarding use of hemoglobin A1c for diagnosis of diabetes in children. . According to American Diabetes Association (ADA) guidelines, hemoglobin A1c <7.0% represents optimal control in non-pregnant diabetic patients. Different metrics may apply to specific patient populations.  Standards of Medical Care in Diabetes(ADA). .     CBG: Recent Labs  Lab 03/27/20 1204 03/27/20 1553 03/27/20 1945 03/27/20 2327 03/28/20 0326  GLUCAP 132* [phone] [place], MD Internal medicine resident PGY 2 Pager # [phone] 03/28/20 8:35 AM

## 2020-03-28 NOTE — Progress Notes (Signed)
OT Cancellation Note  Patient Details Name: Ethan Fitzgerald MRN: [id number] DOB: 11/18/1955   Cancelled Treatment:    Reason Eval/Treat Not Completed: Active bedrest order. Remains on vent.will assess when medically ready.   Ramond Dial, OT/L   Acute OT Clinical Specialist Acute Rehabilitation Services Pager [phone] Office [phone]  03/28/2020, 6:35 AM

## 2020-03-28 NOTE — Progress Notes (Signed)
Pt not following commands with LUE, Dr. Leonel Ramsay aware, will continue to monitor.

## 2020-03-28 NOTE — Progress Notes (Signed)
STROKE TEAM PROGRESS NOTE   INTERVAL HISTORY Patient remains intubated and mildly sedated this morning but appears to be weaning well.  He is arousable and follows commands.  He can move the left side against gravity though does have weakness.  Vital signs are stable.  MRI scan of the brain yesterday showed moderate size right MCA infarct with subarachnoid hemorrhage over convexities.  Incidental right parotid lesion is noted which is suspicious for neoplasm.  Repeat CT scan of the head without contrast this morning shows unchanged size of the acute right MCA infarct with stable small size subarachnoid hemorrhage Vitals:   03/28/20 0800 03/28/20 0815 03/28/20 0819 03/28/20 0820  BP: (!) 154/93 (!) 135/85    Pulse: 61 65 79   Resp: 15 14 17    Temp: 98 F (36.7 C)     TempSrc: Axillary     SpO2: 100% 100% 100% 100%  Weight:      Height:       CBC:  Recent Labs  Lab 03/27/20 0130 03/27/20 0143 03/27/20 0823 03/27/20 0832  WBC 9.0  --  16.3*  --   NEUTROABS 6.0  --   --   --   HGB 13.2   < > 15.9 15.3  HCT 39.9   < > 47.0 45.0  MCV 98.5  --  98.5  --   PLT 245  --  269  --    < > = values in this interval not displayed.   Basic Metabolic Panel:  Recent Labs  Lab 03/27/20 0130 03/27/20 0130 03/27/20 0143 03/27/20 0832  NA 140   < > 143 139  K 3.3*   < > 3.1* 4.7  CL 112*  --  110  --   CO2 17*  --   --   --   GLUCOSE 80  --  74  --   BUN 15  --  18  --   CREATININE 0.80  --  0.70  --   CALCIUM 7.6*  --   --   --    < > = values in this interval not displayed.   Lipid Panel:  Recent Labs  Lab 03/27/20 0823  CHOL 153  TRIG 115  HDL 48  CHOLHDL 3.2  VLDL 23  LDLCALC 82   HgbA1c:  Recent Labs  Lab 03/27/20 0823  HGBA1C 5.7*   Urine Drug Screen: No results for input(s): LABOPIA, COCAINSCRNUR, LABBENZ, AMPHETMU, THCU, LABBARB in the last 168 hours.  Alcohol Level  Recent Labs  Lab 03/27/20 0130  ETH <10    IMAGING past 24 hours CT HEAD WO  CONTRAST  Result Date: 03/28/2020 CLINICAL DATA:  Stroke follow-up. Right ICA and MCA occlusion status post partial endovascular revascularization. EXAM: CT HEAD WITHOUT CONTRAST TECHNIQUE: Contiguous axial images were obtained from the base of the skull through the vertex without intravenous contrast. COMPARISON:  Head MRI 03/27/2020 FINDINGS: Brain: A moderate-sized acute right MCA infarct centered in the right frontal operculum is unchanged in size from yesterday's MRI. There is well-defined cytotoxic edema with mild regional mass effect but no significant midline shift. Hyperdensity within regional sulci, right sylvian fissure, and interpeduncular cistern may reflect a combination of subarachnoid hemorrhage and contrast, also similar in distribution to the prior MRI. There may be a small amount of parenchymal petechial hemorrhage, however no sizable parenchymal hematoma is evident. The ventricles are normal in size. There is no extra-axial fluid collection. Vascular: Calcified atherosclerosis at the skull base. Skull: No  fracture or suspicious osseous lesion. Sinuses/Orbits: Mild bilateral ethmoid air cell mucosal thickening. Small volume bubbly secretions in the left sphenoid sinus. Clear mastoid air cells. Unremarkable orbits. Other: None. IMPRESSION: Unchanged size of acute right MCA infarct with similar distribution of associated subarachnoid hemorrhage and/or contrast. Electronically Signed   By: Logan Bores M.D.   On: 03/28/2020 06:37   MR BRAIN WO CONTRAST  Addendum Date: 03/27/2020   ADDENDUM REPORT: 03/27/2020 16:06 ADDENDUM: Findings of subarachnoid hemorrhage and right MCA acute infarct called by telephone at the time of interpretation on 03/27/2020 at 4:06 pm to provider Dr. Leonie Man, who verbally acknowledged these results. Electronically Signed   By: Kellie Simmering DO   On: 03/27/2020 16:06   Result Date: 03/27/2020 CLINICAL DATA:  Stroke, follow-up. EXAM: MRI HEAD WITHOUT CONTRAST TECHNIQUE:  Multiplanar, multiecho pulse sequences of the brain and surrounding structures were obtained without intravenous contrast. COMPARISON:  CT angiogram head/neck 03/27/2020, noncontrast head CT 03/27/2020 FINDINGS: Brain: There is cortical/subcortical restricted diffusion consistent with acute infarction within the right MCA vascular territory. Most notably, there is a confluent region of infarction within the posterolateral right frontal lobe, right frontal operculum, right insula and subtly involving portions of the superomedial right temporal lobe. This region of confluent infarction approximately 5.4 x 3.7 x 5.2 cm (AP x TV x CC). Additional small acute infarcts within the right caudate nucleus, right pre and postcentral gyri and mid to anterior left frontal lobe. Corresponding T2/FLAIR hyperintensity at these sites. There are small foci of T2* signal loss within the right sylvian fissure which may reflect subtle petechial hemorrhage and/or endoluminal thrombus (series 8, images 9-12). There is T2/FLAIR hyperintensity within the interpeduncular cistern, right MCA cistern, right sylvian fissure, and scattered along the right greater than left cerebral convexities likely reflecting subarachnoid hemorrhage. There is no evidence of hydrocephalus or midline shift. Background mild generalized parenchymal atrophy and mild cerebral white matter chronic small vessel ischemic disease. Vascular: Signal abnormality within the ICA siphons bilaterally suspicious for persistent occlusion or high-grade stenosis. T2 star signal loss within the right sylvian fissure suspicious for endoluminal thrombus within right M2 vessels. Skull and upper cervical spine: No focal marrow lesion. Sinuses/Orbits: Visualized orbits show no acute finding. Mild paranasal sinus mucosal thickening at the imaged levels, most notably ethmoidal. No significant mastoid effusion. Other: Incidentally noted T2 hyperintense lesion within the medial right parotid  gland measuring 11 mm. IMPRESSION: 5.4 x 3.7 x 5.2 cm cortical/subcortical acute right MCA infarct centered within the posterolateral right frontal lobe, right frontal operculum, right insula and subtly involving portions of the superomedial right temporal lobe. Possible mild petechial hemorrhage. No significant mass effect at this time. Additional small acute infarcts within the right caudate nucleus, right pre and postcentral gyri and mid to anterior left frontal lobe. T2* signal loss within the right sylvian fissure suspicious for residual endoluminal thrombus. Additionally, there is persistent signal abnormality within the ICA siphons bilaterally likely reflecting persistent high-grade stenosis or occlusion. FLAIR hyperintensity within the interpeduncular cistern, right MCA cistern, right sylvian fissure and overlying the right greater than left cerebral hemispheres likely reflecting subarachnoid hemorrhage. No hydrocephalus. Background mild generalized parenchymal atrophy and chronic small vessel ischemic disease. Incidentally noted 11 mm T2 hyperintense lesion within the medial right parotid gland suspicious for primary parotid neoplasm. Consider ENT consultation when clinically appropriate. Electronically Signed: By: Kellie Simmering DO On: 03/27/2020 15:47   ECHOCARDIOGRAM COMPLETE  Result Date: 03/27/2020    ECHOCARDIOGRAM REPORT   Patient Name:  Ethan Fitzgerald Date of Exam: 03/27/2020 Medical Rec #:  [id number]      Height:       72.0 in Accession #:    [id number]     Weight:       174.0 lb Date of Birth:  1956-03-01      BSA:          2.008 m Patient Age:    21 years       BP:           121/96 mmHg Patient Gender: M              HR:           75 bpm. Exam Location:  Inpatient Procedure: 2D Echo Indications:    Stroke 434.91 / I163.9  History:        Patient has no prior history of Echocardiogram examinations.                 COPD; Risk Factors:Current Smoker. R MCA revascularization this                  morning.  Sonographer:    Darlina Sicilian RDCS Referring Phys: [phone] MCNEILL P KIRKPATRICK  Sonographer Comments: Suboptimal parasternal window, suboptimal apical window and echo performed with patient supine and on artificial respirator. IMPRESSIONS  1. Technically difficult study with limited views. Left ventricular ejection fraction, by estimation, is grossly 55 to 60%. The left ventricle has normal function. Left ventricular diastolic parameters were normal.  2. Right ventricular systolic function is normal. The right ventricular size is normal. Tricuspid regurgitation signal is inadequate for assessing PA pressure.  3. The mitral valve is normal in structure. No evidence of mitral valve regurgitation.  4. The aortic valve was not well visualized. Aortic valve regurgitation is not visualized. No aortic stenosis is present. FINDINGS  Left Ventricle: Left ventricular ejection fraction, by estimation, is 55 to 60%. The left ventricle has normal function. The left ventricle has no regional wall motion abnormalities. The left ventricular internal cavity size was normal in size. There is  no left ventricular hypertrophy. Left ventricular diastolic parameters were normal. Right Ventricle: The right ventricular size is normal. No increase in right ventricular wall thickness. Right ventricular systolic function is normal. Tricuspid regurgitation signal is inadequate for assessing PA pressure. Left Atrium: Left atrial size was normal in size. Right Atrium: Right atrial size was normal in size. Pericardium: There is no evidence of pericardial effusion. Mitral Valve: The mitral valve is normal in structure. No evidence of mitral valve regurgitation. Tricuspid Valve: The tricuspid valve is normal in structure. Tricuspid valve regurgitation is trivial. Aortic Valve: The aortic valve was not well visualized. Aortic valve regurgitation is not visualized. No aortic stenosis is present. Pulmonic Valve: The pulmonic valve was not  well visualized. Pulmonic valve regurgitation is not visualized. Aorta: The aortic root is normal in size and structure. IAS/Shunts: The interatrial septum was not well visualized.  LEFT VENTRICLE PLAX 2D LVIDd:         4.40 cm  Diastology LVIDs:         3.50 cm  LV e' lateral:   8.70 cm/s LV PW:         0.70 cm  LV E/e' lateral: 6.5 LV IVS:        0.80 cm  LV e' medial:    8.70 cm/s LVOT diam:     2.30 cm  LV E/e' medial:  6.5 LV SV:  54 LV SV Index:   27 LVOT Area:     4.15 cm  LEFT ATRIUM             Index LA diam:        2.50 cm 1.24 cm/m LA Vol (A2C):   88.7 ml 44.17 ml/m LA Vol (A4C):   52.4 ml 26.09 ml/m LA Biplane Vol: 66.4 ml 33.07 ml/m  AORTIC VALVE LVOT Vmax:   52.70 cm/s LVOT Vmean:  35.600 cm/s LVOT VTI:    0.131 m  AORTA Ao Root diam: 3.30 cm MITRAL VALVE MV Area (PHT): 2.62 cm    SHUNTS MV Decel Time: 289 msec    Systemic VTI:  0.13 m MV E velocity: 56.60 cm/s  Systemic Diam: 2.30 cm MV A velocity: 39.80 cm/s MV E/A ratio:  1.42 Oswaldo Milian MD Electronically signed by Oswaldo Milian MD Signature Date/Time: 03/27/2020/2:43:13 PM    Final     PHYSICAL EXAM    Middle-aged Caucasian male who is intubated not sedated. . Afebrile. Head is nontraumatic. Neck is supple without bruit.    Cardiac exam no murmur or gallop. Lungs are clear to auscultation. Distal pulses are well felt. Neurological Exam :  Patient is intubated.  Not on sedation.  Awake.  Right gaze preference.  Able to look to the left past midline.  Blinks to threat on the right but not the left.  Follows simple midline and commands on the right.  Purposeful antigravity movements on the right.  Left hemiparesis but able to elevate the left upper and lower extremity off the bed.  Tone is diminished on the left compared to the right.  Both plantars are downgoing.  Gait not tested.   ASSESSMENT/PLAN Mr. WILLEM KLINGENSMITH is a 64 y.o. male with history of COPD presenting with L sided weakness. Received tPA 03/27/2020  at 0151.   Stroke:   R MCA and multiple R and L anterior circulation infarcts s/p tPA and IR w/ partial revascularization, most likely secondary to large vessel disease, ?  Dissection due to recent coughing episodes, ? hypercoagulable from parotid neoplasm   code Stroke CT head dense R M1. Subtle asymmetric hypodensity R caudate and lentiform nucleus. ASPECTS 8    CTA head & neck R ICA LVO at bifurcation origin w/ cavernous reconstitution and reocclusion proximal R M1. Good MCA collaterals. L ICA occlusion at bifurcation origin. Short severe L ICA cavernous stenosis. Emphysema.   Cerebral angio partial revascularization R MCA dominant division after 4 passes w/ TICI2a/TICI2b. S/p angioplasty acute R ICA occlusion w/ reocclusion     Post IR CT mild to mod RT peri-insular and post frontal convexity contrast stain vs SAH   MRI  R MCA frontal lobe infarct w/ possible mild petechial hemorrhage. R caudate nucleus, R pre & post central gyri and mid to anterior L frontal lobe infarcts. R sylvian fissure signal loss c/w thrombus. B ICA siphon high-grade stenosis vs occlusion. R MCA SAH. Atrophy. Small vessel disease.  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 No source of embolus   LDL 82   HgbA1c 5.6  VTE prophylaxis - SCDs   No antithrombotic prior to admission, received aspirin 81 and brilinta in IR, now on No antithrombotic. Ok to add aspirin (on po 81 vs 150 rectal)  Therapy recommendations:  pending   Disposition:  pending   Acute Hypoxemic Respiratory Failure Secondary  to stroke  Intubated for IR, left intubated for airway protection following  Sedated ->turn off  Hope ok for extubation today  Discussed with CCM   Parotid mass  Incidental finding  Could be etiology of stroke  Tend to be slow growing  ENT referral at d/c w/ bx after rehab completed  Carotid stenosis   Chronic  L ICA occlusion  Acute R ICA occlusion S/p R ICA angioplasty w/ reocclusion, ? Dissection from cough  Blood Pressure Hypotension  Home meds:  None, no hx Hypertension . asystole on IR table . BP on the low side  . Now on neo for IR goal 140-160 x 24h given post IR ICA occlusions . Decrease SBP goal to > 120 and wean neo  . Long-term BP goal 130-150  Hyperlipidemia  Home meds:  crestor 10 and fish oil  LDL 82, goal < 70  Resume home meds once PO access  Continue statin at discharge  Prediabetes  HgbA1c 5.6  On very sensistive SSI  Dysphagia . Secondary to stroke . NPO . Speech to assess   Other Stroke Risk Factors  Advanced age  Former Cigarette smoker, quit 2 yrs ago  Former smokeless tobacco user  ETOH use, advised to drink no more than 2 drink(s) a day  Other Active Problems  COPD w/o acute exacerbation   Hypokalemia  Leukocytosis, WBC 16.3->14.3  Hypokalemia 3.3->3.1->4.7 - resolved  Hospital day # 1 Plan extubate today if tolerated.  Maintain mild permissive hypertension with systolic blood pressure goal 120-130 range.  Use IV fluids and if needed Neo Synephrine drip.  Speech therapy eval after extubation for swallow.  Aspirin only for now given subarachnoid hemorrhage but may need to add Plavix after subarachnoid hemorrhage resolves.  Long discussion with the patient and wife at the bedside and answered questions.  Discussed with Dr. Tacy Learn critical care medicine  This patient is critically ill and at significant risk of neurological worsening, death and care requires constant monitoring of vital signs, hemodynamics,respiratory and cardiac monitoring, extensive review of multiple databases, frequent neurological assessment, discussion with family, other specialists and medical decision making of high complexity.I have made any additions or clarifications directly to the above note.This critical care time does not reflect procedure time, or teaching time  or supervisory time of PA/NP/Med Resident etc but could involve care discussion time.  I spent 35 minutes of neurocritical care time  in the care of  this patient.  Antony Contras, MD     To contact Stroke Continuity provider, please refer to [email or web address] After hours, contact General Neurology

## 2020-03-28 NOTE — Progress Notes (Signed)
Pt transported to ct and back with no complications. 

## 2020-03-28 NOTE — Procedures (Signed)
Extubation Procedure Note  Patient Details:   Name: Ethan Fitzgerald DOB: 01/15/1956 MRN: [id number]   Airway Documentation:    Vent end date: 03/28/20 Vent end time: 1000   Evaluation  O2 sats: stable throughout Complications: No apparent complications Patient did tolerate procedure well. Bilateral Breath Sounds: Diminished   Yes   Patient extubated per MD order. Positive cuff leak. No stridor noted. Vitals are stable on 3L [place]. RN at bedside.  Milinda Sweeney H Megin Consalvo 03/28/2020, 10:04 AM

## 2020-03-28 NOTE — Progress Notes (Signed)
SLP Cancellation Note  Patient Details Name: Ethan Fitzgerald MRN: [id number] DOB: Jan 25, 1956   Cancelled treatment:       Reason Eval/Treat Not Completed: Patient not medically ready   Bethaney Oshana, Katherene Ponto 03/28/2020, 8:21 AM

## 2020-03-28 NOTE — Evaluation (Signed)
Clinical/Bedside Swallow Evaluation Patient Details  Name: Ethan Fitzgerald MRN: [id number] Date of Birth: 1955-09-13  Today's Date: 03/28/2020 Time: SLP Start Time (ACUTE ONLY): 1400 SLP Stop Time (ACUTE ONLY): 1412 SLP Time Calculation (min) (ACUTE ONLY): 12 min  Past Medical History:  Past Medical History:  Diagnosis Date   COPD (chronic obstructive pulmonary disease) (HCC)    mild   Erectile dysfunction    Headache    daily, stress headaches   Knee pain, left    Seasonal allergies    Tobacco dependence    Past Surgical History:  Past Surgical History:  Procedure Laterality Date   BACK SURGERY     metal plate in back   COLONOSCOPY     COLONOSCOPY WITH PROPOFOL N/A 04/26/2019   Procedure: COLONOSCOPY WITH PROPOFOL;  Surgeon: Jonathon Bellows, MD;  Location: [facility] ENDOSCOPY;  Service: Gastroenterology;  Laterality: N/A;   HERNIA REPAIR Right    KNEE ARTHROSCOPY Left 03/28/2015   Procedure: Arthroscopic partial medial meniscectomy plus chondral debridement;  Surgeon: Leanor Kail, MD;  Location: [place];  Service: Orthopedics;  Laterality: Left;   RADIOLOGY WITH ANESTHESIA N/A 03/27/2020   Procedure: IR WITH ANESTHESIA;  Surgeon: Luanne Bras, MD;  Location: [place];  Service: Radiology;  Laterality: N/A;   HPI:  64 y.o. male with history of COPD presenting with L sided weakness.  R MCA infarct s/p tPA and IR with partial revascularization. CT head 7/23: moderate-sized acute right MCA infarct centered in right frontal operculum; Hyperdensity within regional sulci, right sylvian fissure, and interpeduncular cistern may reflect a combination of subarachnoid hemorrhage and contrast.     Assessment / Plan / Recommendation Clinical Impression  Pt presents with s/s of a neurogenic dysphagia with focal CN deficits V, VII, XII on left, dysarthria of speech, explosive coughing and concern for aspiration after consumption of water.  He tolerated applesauce with mild  anterior spillage from left, palpable swallow, no overt indication of aspiration.  Dr. Leonie Man and RN present for exam.  Recommend continuing NPO for now given concerns for aspiration and moderate sensorimotor deficits.  May give critical meds whole in puree.  SLP will f/u next date to assess for PO readiness and speech/language evaluation.  SLP Visit Diagnosis: Dysphagia, oropharyngeal phase (R13.12)    Aspiration Risk  Moderate aspiration risk    Diet Recommendation   npo  Medication Administration: Whole meds with puree    Other  Recommendations Oral Care Recommendations: Oral care QID   Follow up Recommendations        Frequency and Duration min 3x week  2 weeks       Prognosis Prognosis for Safe Diet Advancement: Good      Swallow Study   General Date of Onset: 03/27/20 HPI: 64 y.o. male with history of COPD presenting with L sided weakness.  R MCA infarct s/p tPA and IR with partial revascularization. CT head 7/23: moderate-sized acute right MCA infarct centered in right frontal operculum; Hyperdensity within regional sulci, right sylvian fissure, and interpeduncular cistern may reflect a combination of subarachnoid hemorrhage and contrast.   Type of Study: Bedside Swallow Evaluation Previous Swallow Assessment: no Diet Prior to this Study: NPO Temperature Spikes Noted: No Respiratory Status: Nasal cannula History of Recent Intubation: Yes Length of Intubations (days):  (for procedure) Date extubated: 03/28/20 Behavior/Cognition: Alert;Cooperative Oral Cavity Assessment: Within Functional Limits Oral Care Completed by SLP: Recent completion by staff Oral Cavity - Dentition: Edentulous Self-Feeding Abilities: Needs assist Patient Positioning: Upright in  bed Baseline Vocal Quality: Normal Volitional Cough: Strong Volitional Swallow: Able to elicit    Oral/Motor/Sensory Function Overall Oral Motor/Sensory Function: Moderate impairment Facial ROM: Reduced left;Suspected  CN VII (facial) dysfunction Facial Symmetry: Abnormal symmetry left;Suspected CN VII (facial) dysfunction Facial Sensation: Suspected CN V (Trigeminal) dysfunction;Reduced left Lingual Symmetry: Suspected CN XII (hypoglossal) dysfunction Velum: Impaired left   Ice Chips Ice chips: Within functional limits Presentation: Spoon   Thin Liquid Thin Liquid: Impaired Presentation: Cup Oral Phase Functional Implications: Left anterior spillage Pharyngeal  Phase Impairments: Cough - Immediate    Nectar Thick Nectar Thick Liquid: Not tested   Honey Thick Honey Thick Liquid: Not tested   Puree Puree: Impaired Presentation: Spoon Oral Phase Functional Implications: Left anterior spillage   Solid    Ethan Fitzgerald L. Tivis Ringer, MA CCC/SLP Acute Rehabilitation Services Office number [phone] Pager [phone]  Solid: Not tested      Ethan Fitzgerald 03/28/2020,2:22 PM

## 2020-03-29 DIAGNOSIS — E7800 Pure hypercholesterolemia, unspecified: Secondary | ICD-10-CM

## 2020-03-29 LAB — RAPID URINE DRUG SCREEN, HOSP PERFORMED
Amphetamines: NOT DETECTED
Barbiturates: NOT DETECTED
Benzodiazepines: NOT DETECTED
Cocaine: NOT DETECTED
Opiates: NOT DETECTED
Tetrahydrocannabinol: NOT DETECTED

## 2020-03-29 LAB — BASIC METABOLIC PANEL
Anion gap: 10 (ref 5–15)
BUN: 8 mg/dL (ref 8–23)
CO2: 20 mmol/L — ABNORMAL LOW (ref 22–32)
Calcium: 8.8 mg/dL — ABNORMAL LOW (ref 8.9–10.3)
Chloride: 110 mmol/L (ref 98–111)
Creatinine, Ser: 0.79 mg/dL (ref 0.61–1.24)
GFR calc Af Amer: >60 mL/min (ref >60)
GFR calc non Af Amer: >60 mL/min (ref >60)
Glucose, Bld: 101 mg/dL — ABNORMAL HIGH (ref 70–99)
Potassium: 3.4 mmol/L — ABNORMAL LOW (ref 3.5–5.1)
Sodium: 140 mmol/L (ref 135–145)

## 2020-03-29 LAB — CBC
HCT: 37.5 % — ABNORMAL LOW (ref 39.0–52.0)
Hemoglobin: 13 g/dL (ref 13.0–17.0)
MCH: 33.9 pg (ref 26.0–34.0)
MCHC: 34.7 g/dL (ref 30.0–36.0)
MCV: 97.7 fL (ref 80.0–100.0)
Platelets: 231 10*3/uL (ref 150–400)
RBC: 3.84 MIL/uL — ABNORMAL LOW (ref 4.22–5.81)
RDW: 13.2 % (ref 11.5–15.5)
WBC: 11.2 10*3/uL — ABNORMAL HIGH (ref 4.0–10.5)
nRBC: 0 % (ref 0.0–0.2)

## 2020-03-29 LAB — GLUCOSE, CAPILLARY
Glucose-Capillary: 91 mg/dL (ref 70–99)
Glucose-Capillary: 96 mg/dL (ref 70–99)
Glucose-Capillary: 104 mg/dL — ABNORMAL HIGH (ref 70–99)
Glucose-Capillary: 91 mg/dL (ref 70–99)
Glucose-Capillary: 106 mg/dL — ABNORMAL HIGH (ref 70–99)
Glucose-Capillary: 88 mg/dL (ref 70–99)

## 2020-03-29 MED ORDER — ASPIRIN 81 MG PO CHEW: 324.0000 mg | CHEWABLE_TABLET | Freq: Every day | ORAL | Status: DC

## 2020-03-29 MED ORDER — BISACODYL 10 MG RE SUPP
10.0000 mg | Freq: Every day | RECTAL | Status: DC | PRN
  Administered 2020-03-29: 10 mg via RECTAL
  Filled 2020-03-29: qty 1

## 2020-03-29 MED ORDER — WHITE PETROLATUM EX OINT
TOPICAL_OINTMENT | CUTANEOUS | Status: AC
  Administered 2020-03-29: 0.2
  Filled 2020-03-29: qty 28.35

## 2020-03-29 MED ORDER — IPRATROPIUM-ALBUTEROL 0.5-2.5 (3) MG/3ML IN SOLN: 3.0000 mL | Freq: Four times a day (QID) | RESPIRATORY_TRACT | Status: DC | PRN

## 2020-03-29 MED ORDER — ASPIRIN 300 MG RE SUPP
300.0000 mg | Freq: Every day | RECTAL | Status: DC
  Administered 2020-03-30: 300 mg via RECTAL
  Filled 2020-03-29: qty 1

## 2020-03-29 MED ORDER — LABETALOL HCL 5 MG/ML IV SOLN: 5.0000 mg | INTRAVENOUS | Status: DC | PRN

## 2020-03-29 NOTE — Progress Notes (Signed)
PCCM progress note   64 year old male with COPD who was admitted with acute right MCA stroke status post TPA and thrombectomy.  Patient has bilateral carotid artery occlusion, status post intervention but reocclusion.  Patient had MRI yesterday, which showed right MCA territory stroke with small subarachnoid hemorrhage. Repeat CT head was done which showed stability  Patient no longer has any critical care management needs. PCCM will sign off. Thank you for the opportunity to participate in this patient's care. Please contact if we can be of further assistance.  Johnsie Cancel, NP-C [facility] Contact / Pager information can be found on Amion  03/29/2020, 10:46 AM

## 2020-03-29 NOTE — Plan of Care (Signed)
  Problem: Clinical Measurements: Goal: Ability to maintain clinical measurements within normal limits will improve Outcome: Progressing   

## 2020-03-29 NOTE — Progress Notes (Signed)
STROKE TEAM PROGRESS NOTE   INTERVAL HISTORY  No family at bedside. Pt extubated yesterday, tolerating well. AAO x 3. Still has left hemiparesis, but improving. Dysarthria, did not pass swallow, speech on board will do MBS tomorrow. Keep NPO today except meds. BP stable now, off Neo.    Vitals:   03/29/20 0000 03/29/20 0100 03/29/20 0200 03/29/20 0400  BP: (!) 129/77 (!) 128/91 (!) 131/72   Pulse: 54 54 64   Resp: 15 21 (!) 26   Temp: 99 F (37.2 C)   99 F (37.2 C)  TempSrc: Oral   Oral  SpO2: 96% 96% 91%   Weight:      Height:       CBC:  Recent Labs  Lab 03/27/20 0130 03/27/20 0143 03/27/20 0823 03/27/20 0823 03/27/20 0832 03/28/20 0820  WBC 9.0   < > 16.3*  --   --  14.3*  NEUTROABS 6.0  --   --   --   --  9.6*  HGB 13.2   < > 15.9   < > 15.3 13.2  HCT 39.9   < > 47.0   < > 45.0 40.8  MCV 98.5   < > 98.5  --   --  101.0*  PLT 245   < > 269  --   --  248   < > = values in this interval not displayed.   Basic Metabolic Panel:  Recent Labs  Lab 03/27/20 0130 03/27/20 0130 03/27/20 0143 03/27/20 0143 03/27/20 0832 03/28/20 0820  NA 140   < > 143   < > 139 141  K 3.3*   < > 3.1*   < > 4.7 3.9  CL 112*   < > 110  --   --  110  CO2 17*  --   --   --   --  24  GLUCOSE 80   < > 74  --   --  94  BUN 15   < > 18  --   --  8  CREATININE 0.80   < > 0.70  --   --  1.03  CALCIUM 7.6*  --   --   --   --  8.8*   < > = values in this interval not displayed.   Lipid Panel:  Recent Labs  Lab 03/27/20 0823  CHOL 153  TRIG 115  HDL 48  CHOLHDL 3.2  VLDL 23  LDLCALC 82   HgbA1c:  Recent Labs  Lab 03/27/20 0823  HGBA1C 5.7*   Urine Drug Screen: No results for input(s): LABOPIA, COCAINSCRNUR, LABBENZ, AMPHETMU, THCU, LABBARB in the last 168 hours.  Alcohol Level  Recent Labs  Lab 03/27/20 0130  ETH <10    IMAGING past 24 hours No results found.  PHYSICAL EXAM     Temp:  [99 F (37.2 C)-100.5 F (38.1 C)] 99 F (37.2 C) (07/24 0800) Pulse Rate:   [52-98] 98 (07/24 0900) Resp:  [13-28] 27 (07/24 0900) BP: (104-158)/(62-102) 134/85 (07/24 0900) SpO2:  [91 %-100 %] 95 % (07/24 0900)  General - Well nourished, well developed, in no apparent distress.  Ophthalmologic - fundi not visualized due to noncooperation.  Cardiovascular - Regular rhythm and rate.  Mental Status -  Level of arousal and orientation to time, place, and person were intact. Language including expression, naming, repetition, comprehension was assessed and found intact. Moderate dysarthria Fund of Knowledge was assessed and was intact.  Cranial Nerves II - XII -  II - Visual field intact OU. III, IV, VI - conjugate gaze, right gaze complete but Left gaze incomplete V - Facial sensation intact bilaterally. VII - left facial droop with weakness of eye closure too VIII - Hearing & vestibular intact bilaterally. X - Palate elevates symmetrically. XI - Chin turning & shoulder shrug intact bilaterally. XII - Tongue protrusion intact.  Motor Strength - The patient's strength was normal in right upper and lower extremities, however left UE 2+/5 and LE 4+/5.  Bulk was normal and fasciculations were absent.   Motor Tone - Muscle tone was assessed at the neck and appendages and was normal.  Reflexes - The patient's reflexes were symmetrical in all extremities and he had no pathological reflexes.  Sensory - Light touch, temperature/pinprick were assessed and were symmetrical.    Coordination - The patient had normal movements in the right hand with no ataxia or dysmetria.  Tremor was absent.  Gait and Station - deferred.    ASSESSMENT/PLAN Mr. DAVONN FLANERY is a 64 y.o. male with history of COPD presenting with L sided weakness. Received tPA 03/27/2020 at 0151.  Stroke:   R MCA and multiple R and L anterior circulation infarcts s/p tPA and IR w/ partial revascularization, most likely secondary to large vessel disease with ? dissection due to coughing episodes, vs.  Hypercoagulable state from parotid neoplasm   CT head dense R M1. Subtle asymmetric hypodensity R caudate and lentiform nucleus. ASPECTS 8    CTA head & neck R ICA LVO at bifurcation origin w/ cavernous reconstitution and reocclusion proximal R M1. Good MCA collaterals. L ICA occlusion at bifurcation origin. Short severe L ICA cavernous stenosis.    Cerebral angio partial revascularization R MCA dominant division w/ TICI2a/TICI2b. S/p angioplasty acute R ICA occlusion w/ reocclusion     Post IR CT mild to mod RT peri-insular and post frontal convexity contrast stain vs SAH   MRI  R MCA frontal lobe infarct w/ possible mild petechial hemorrhage. B ICA siphon high-grade stenosis vs occlusion. R MCA SAH.  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 No source of embolus   LDL 82   HgbA1c 5.6  VTE prophylaxis - SCDs   No antithrombotic prior to admission, received aspirin 81 and brilinta in IR, now on aspirin given small SAH  Therapy recommendations:  HH PT  Disposition:  pending   Acute Hypoxemic Respiratory Failure Secondary to stroke  Intubated for IR, left intubated for airway protection following  Extubated 7/23  Tolerated well  Parotid mass  Incidental finding  Could be etiology of stroke  Tend to be slow growing  ENT referral at d/c w/ bx after rehab completed  Carotid stenosis   Chronic L ICA occlusion  Acute R ICA occlusion S/p R ICA angioplasty w/ reocclusion, ? Dissection from cough  Blood Pressure Hypotension . Asystole on IR table . BP stable . Off Neo . Long-term BP goal 130-150 . NS at 75 cc / hr  Hyperlipidemia  Home meds:  crestor 10 and fish oil  LDL 82, goal < 70  Resume home meds once PO access  Continue statin at discharge  Dysphagia . Secondary to stroke . NPO except meds . Speech to assess . MBS in am   Other Stroke Risk  Factors  Advanced age  Former Cigarette smoker, quit 2 yrs ago  Former smokeless tobacco user  ETOH use, advised to drink no more than 2 drink(s) a day  Other Active Problems  COPD w/o acute exacerbation   Hypokalemia - 3.1->4.7->3.9->  Leukocytosis, WBC 16.3->14.3 (temp 99)  Hospital day # 2  This patient is critically ill due to cerebral embolic strokes, s/p tPA and thrombectomy, carotid stenosis, parotid mass and at significant risk of neurological worsening, death form recurrent stroke, hemorrhagic conversion, seizure. This patient's care requires constant monitoring of vital signs, hemodynamics, respiratory and cardiac monitoring, review of multiple databases, neurological assessment, discussion with family, other specialists and medical decision making of high complexity. I spent 35 minutes of neurocritical care time in the care of this patient.  Rosalin Hawking, MD PhD Stroke Neurology 03/30/2020 12:22 AM   To contact Stroke Continuity provider, please refer to [email or web address] After hours, contact General Neurology

## 2020-03-29 NOTE — Progress Notes (Signed)
New Admission Note:  Arrival Method: arrived in bed from 4N ICU Mental Orientation: pt alert and oriented to person, place, time and situation Telemetry: yes  Assessment: Completed Skin: gauze over rt groin IV: Left FA NS @ 75 mL/hr Pain: no complaint of pain Safety Measures: Safety Fall Prevention Plan was given, discussed. Admission: Completed 3W: Patient has been orientated to the room, unit and the staff. Family: pt wife is at bedside, pleasant and supportive.  Orders have been reviewed and implemented. Will continue to monitor the patient. Call light has been placed within reach and bed alarm has been activated.   Ethan Fitzgerald

## 2020-03-29 NOTE — Evaluation (Signed)
Speech Language Pathology Evaluation Patient Details Name: Ethan Fitzgerald MRN: [id number] DOB: 1955-10-17 Today's Date: 03/29/2020 Time: 4098-1191 SLP Time Calculation (min) (ACUTE ONLY): 14 min  Problem List:  Patient Active Problem List   Diagnosis Date Noted  . Stroke (cerebrum) ([place]) 03/27/2020  . Middle cerebral artery embolism, right 03/27/2020  . History of COVID-19 12/04/2019  . Centrilobular emphysema ([place]) 04/03/2019  . Seasonal allergies 02/18/2017  . Elevated hemoglobin A1c 02/18/2017  . Erectile dysfunction 02/18/2017  . Hyperlipidemia 02/18/2017  . History of hemorrhoids 02/18/2017  . Former smoker 02/18/2017  . Chronic low back pain 02/18/2017  . Chronic pain of left knee 02/18/2017   Past Medical History:  Past Medical History:  Diagnosis Date  . COPD (chronic obstructive pulmonary disease) (H[facility])    mild  . Erectile dysfunction   . Headache    daily, stress headaches  . Knee pain, left   . Seasonal allergies   . Tobacco dependence    Past Surgical History:  Past Surgical History:  Procedure Laterality Date  . BACK SURGERY     metal plate in back  . COLONOSCOPY    . COLONOSCOPY WITH PROPOFOL N/A 04/26/2019   Procedure: COLONOSCOPY WITH PROPOFOL;  Surgeon: Jonathon Bellows, MD;  Location: [facility] ENDOSCOPY;  Service: Gastroenterology;  Laterality: N/A;  . HERNIA REPAIR Right   . KNEE ARTHROSCOPY Left 03/28/2015   Procedure: Arthroscopic partial medial meniscectomy plus chondral debridement;  Surgeon: Leanor Kail, MD;  Location: [facility];  Service: Orthopedics;  Laterality: Left;  . RADIOLOGY WITH ANESTHESIA N/A 03/27/2020   Procedure: IR WITH ANESTHESIA;  Surgeon: Luanne Bras, MD;  Location: [facility];  Service: Radiology;  Laterality: N/A;   HPI:  64 y.o. male with history of COPD presenting with L sided weakness.  R MCA infarct s/p tPA and IR with partial revascularization. CT head 7/23: moderate-sized acute right MCA infarct centered in right  frontal operculum; Hyperdensity within regional sulci, right sylvian fissure, and interpeduncular cistern may reflect a combination of subarachnoid hemorrhage and contrast.     Assessment / Plan / Recommendation Clinical Impression  Patient was seen for a cognitive-linguistic evaluation in the setting of a R MCA and multiple R and L anterior circulation infarcts s/p tPA and IR w/ partial revascularization.  Patient was lethargic throughout this session, but he was pleasant and cooperative.  He stated that he lives at home with his wife and that he has family that lives near by.  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  Pt exhibited dysarthria with speech approximately 65% intelligible at the word level and 50% intelligible at the sentence level.  Pt exhibited mild deficits with short-term memory, but other cognitive functions appeared to be grossly functional.  He additionally exhibited mild difficulty with complex yes/no questions, but he completed additional receptive language tasks such as 3+ step command following without difficulty.  Expressive language was functional.  Recommend additional ST at time of discharge targeting dysarthria and cognitive-linguistic deficits.  Given that pt was working full time prior to admission, would additionally recommend a higher level cognitive evaluation such as the RBANS in the outpatient ST setting if pt plans to return to work.  SLP will f/u acutely for treatment targeting dysarthria, mild cognitive-linguistic deficits, and diagnostic treatment.      SLP Assessment  SLP Recommendation/Assessment: Patient needs continued [place] Pathology Services  SLP Visit Diagnosis: Cognitive communication deficit (R41.841);Dysarthria and anarthria (R47.1)    Follow Up Recommendations  Outpatient SLP    Frequency and Duration min 2x/week  2 weeks       SLP Evaluation Cognition  Overall Cognitive Status: Impaired/Different from baseline Arousal/Alertness: Lethargic Orientation Level: Oriented X4 Attention: Focused;Sustained Focused Attention: Appears intact Sustained Attention: Appears intact Immediate Memory Recall: Sock;Blue;Bed Memory Recall Sock: Without Cue Memory Recall Blue: Without Cue Memory Recall Bed: Not able to recall Awareness: Appears intact Problem Solving: Appears intact Safety/Judgment: Appears intact       Comprehension  Auditory Comprehension Overall Auditory Comprehension: Impaired Yes/No Questions: Impaired Basic Biographical Questions: 76-100% accurate Basic Immediate Environment Questions: 75-100% accurate Complex Questions: 50-74% accurate Commands: Within Functional Limits Conversation: Complex    Expression Expression Primary Mode of Expression: Verbal Verbal Expression Overall Verbal Expression: Appears within functional limits for tasks assessed Initiation: No impairment Level of Generative/Spontaneous Verbalization: Conversation Repetition: No impairment Naming: No impairment Pragmatics: No impairment Written Expression Dominant Hand: Left Written Expression: Not tested   Oral / Motor  Oral Motor/Sensory Function Overall Oral Motor/Sensory Function: Moderate impairment Facial ROM: Reduced left;Suspected CN VII (facial) dysfunction Facial Symmetry: Abnormal symmetry left;Suspected CN VII (facial) dysfunction Facial Sensation: Suspected CN V (Trigeminal) dysfunction;Reduced left Lingual Symmetry: Suspected CN XII (hypoglossal) dysfunction Velum: Impaired left Motor Speech Overall Motor Speech: Impaired Respiration: Within functional limits Phonation: Normal Resonance: Within functional limits Articulation: Impaired Level of Impairment: Word Intelligibility: Intelligibility reduced Word: 50-74% accurate Phrase: 50-74% accurate Sentence: 50-74% accurate Conversation: 50-74%  accurate Motor Planning: Witnin functional limits Interfering Components: Inadequate dentition   GO                   Colin Mulders., M.S., CCC-SLP Acute Rehabilitation Services Office: [phone]  [facility] 03/29/2020, 10:16 AM

## 2020-03-29 NOTE — Progress Notes (Signed)
Physical Therapy Treatment Patient Details Name: Ethan Fitzgerald MRN: [id number] DOB: 1955-10-05 Today's Date: 03/29/2020    History of Present Illness 64 year old male with COPD, who came with acute right MCA stroke status post TPA and thrombectomy of bilateral ICA and right M1.    PT Comments    Pt with noted impulsivity, decreased insight to safety, L sided weakness, and impaired balance/incresaed fall risk as indicated by score of 16/24 on DGI. Pt with good home set up and support and anticipate pt to progress well enough to d/c home with spouse for HHPT and possible use of RW once medically stable. Acute PT to cont to follow.   Follow Up Recommendations  Home health PT;Supervision/Assistance - 24 hour     Equipment Recommendations  None recommended by PT    Recommendations for Other Services       Precautions / Restrictions Precautions Precautions: Fall Precaution Comments: impulsive Restrictions Weight Bearing Restrictions: No    Mobility  Bed Mobility Overal bed mobility: Needs Assistance Bed Mobility: Supine to Sit     Supine to sit: Min assist;HOB elevated     General bed mobility comments: minA for trunk elevation, HOB elevated, able to bring LEs off EBO  Transfers Overall transfer level: Needs assistance Equipment used: 1 person hand held assist Transfers: Sit to/from Stand Sit to Stand: Mod assist;From elevated surface         General transfer comment: modA to power up and steady upon standing, wide base of support  Ambulation/Gait Ambulation/Gait assistance: Min assist;Mod assist;+2 physical assistance Gait Distance (Feet): 175 Feet Assistive device: 2 person hand held assist Gait Pattern/deviations: Step-through pattern;Decreased stride length;Decreased weight shift to left;Decreased dorsiflexion - left Gait velocity: slow Gait velocity interpretation: <1.8 ft/sec, indicate of risk for recurrent falls General Gait Details: pt inititally  requiring modAx2 to ambulate with noted L LE weakness, difficulty clearing foot and achieving L knee extension. pt with lateral sway, instability with turning requiring increased assist however became more stable towards the end of ambulation session   Stairs             Wheelchair Mobility    Modified Rankin (Stroke Patients Only) Modified Rankin (Stroke Patients Only) Pre-Morbid Rankin Score: No symptoms Modified Rankin: Moderately severe disability     Balance Overall balance assessment: Needs assistance Sitting-balance support: Feet supported;No upper extremity supported Sitting balance-Leahy Scale: Fair     Standing balance support: During functional activity;Bilateral upper extremity supported Standing balance-Leahy Scale: Poor Standing balance comment: dependent on physical assist at this time                 Standardized Balance Assessment Standardized Balance Assessment : Dynamic Gait Index   Dynamic Gait Index Level Surface: Mild Impairment Change in Gait Speed: Mild Impairment Gait with Horizontal Head Turns: Mild Impairment Gait with Vertical Head Turns: Mild Impairment Gait and Pivot Turn: Mild Impairment Step Over Obstacle: Mild Impairment Step Around Obstacles: Mild Impairment Steps: Mild Impairment Total Score: 16      Cognition Arousal/Alertness: Awake/alert Behavior During Therapy: Impulsive Overall Cognitive Status: Impaired/Different from baseline Area of Impairment: Attention;Safety/judgement;Problem solving                   Current Attention Level: Focused   Following Commands: Follows one step commands with increased time;Follows one step commands consistently;Follows multi-step commands with increased time Safety/Judgement: Decreased awareness of safety;Decreased awareness of deficits   Problem Solving: Slow processing;Requires verbal cues;Requires tactile cues General Comments: pt  alert however sleepy keeps eyes closed,  pt with delayed processing      Exercises      General Comments General comments (skin integrity, edema, etc.): VSS, pt with noted SOB with ambulation however reports this to due to his COPD      Pertinent Vitals/Pain Pain Assessment: No/denies pain    Home Living     Available Help at Discharge: Family Type of Home: House              Prior Function            PT Goals (current goals can now be found in the care plan section) Progress towards PT goals: Progressing toward goals    Frequency    Min 4X/week      PT Plan Current plan remains appropriate    Co-evaluation              AM-PAC PT "6 Clicks" Mobility   Outcome Measure  Help needed turning from your back to your side while in a flat bed without using bedrails?: A Little Help needed moving from lying on your back to sitting on the side of a flat bed without using bedrails?: A Little Help needed moving to and from a bed to a chair (including a wheelchair)?: A Little Help needed standing up from a chair using your arms (e.g., wheelchair or bedside chair)?: A Lot Help needed to walk in hospital room?: A Lot Help needed climbing 3-5 steps with a railing? : A Lot 6 Click Score: 15    End of Session Equipment Utilized During Treatment: Gait belt Activity Tolerance: Patient tolerated treatment well Patient left: in chair;with call bell/phone within reach;with chair alarm set;with family/visitor present;with nursing/sitter in room Nurse Communication: Mobility status (assist with ambulation) PT Visit Diagnosis: Hemiplegia and hemiparesis;Other symptoms and signs involving the nervous system (R29.898);Other abnormalities of gait and mobility (R26.89) Hemiplegia - Right/Left: Left Hemiplegia - dominant/non-dominant: Non-dominant Hemiplegia - caused by: Cerebral infarction     Time: 4142-3953 PT Time Calculation (min) (ACUTE ONLY): 34 min  Charges:  $Gait Training: 8-22 mins $Neuromuscular  Re-education: 8-22 mins                     Kittie Plater, PT, DPT Acute Rehabilitation Services Pager #: [phone] Office #: [phone]    [place] Lopes 03/29/2020, 12:12 PM

## 2020-03-29 NOTE — Progress Notes (Signed)
  Speech Language Pathology Treatment: Dysphagia  Patient Details Name: Ethan Fitzgerald MRN: [id number] DOB: 04/30/1956 Today's Date: 03/29/2020 Time: 6384-6659 SLP Time Calculation (min) (ACUTE ONLY): 13 min  Assessment / Plan / Recommendation Clinical Impression  Patient was encountered asleep in bed and he roused to moderate verbal stimulation.  Patient was lethargic throughout this session, but he was pleasant and cooperative.  RN reported that pt consumed oral medications whole with puree without difficulty this morning.  Oral cavity was observed prior to PO trials, and pt was noted to have thick, white secretions coating his hard and soft palate.  Oral care was completed and secretions were removed with suction swab.  Pt consumed trials of ice chips, thin liquid (tsp), nectar-thick liquid (tsp), honey-thick liquid (tsp/cup) and puree.  Anterior labial spillage on the L side was noted with thin liquid trial, and pt exhibited an immediate, prolonged cough following 1/1 tsp of thin liquid that resulted in expulsion of thick, tan secretions.  Delayed, prolonged cough was also noted following 1/1 tsp of nectar-thick liquid.  No overt s/sx of aspiration were observed with an ice chip, honey-thick liquid via tsp/cup sip, or with puree.  Recommend a modified barium swallow study prior to diet initiation.  Pt politely refused study today, stating that he was too lethargic, but he was agreeable to completing the MBS tomorrow.  Pt may have snacks of puree and honey-thick liquid (not to exceed 4oz each per day) given full supervision and cues for compensatory strategies (listed below).  Patient may also have essential PO medications whole or crushed in puree as tolerated.  SLP will plan to f/u for MBS tomorrow.   HPI HPI: 64 y.o. male with history of COPD presenting with L sided weakness.  R MCA infarct s/p tPA and IR with partial revascularization. CT head 7/23: moderate-sized acute right MCA infarct centered in  right frontal operculum; Hyperdensity within regional sulci, right sylvian fissure, and interpeduncular cistern may reflect a combination of subarachnoid hemorrhage and contrast.        SLP Plan  MBS  Patient needs continued Speech Lanagu[place] Pathology Services    Recommendations  Diet recommendations: NPO (with snacks of puree/honey-thick liquid (not to exceed 4oz) ) Medication Administration: Whole meds with puree Supervision: Staff to assist with self feeding;Full supervision/cueing for compensatory strategies Compensations: Slow rate;Small sips/bites Postural Changes and/or Swallow Maneuvers: Seated upright 90 degrees                Oral Care Recommendations: Oral care QID;Oral care before and after PO Follow up Recommendations: Outpatient SLP SLP Visit Diagnosis: Dysphagia, unspecified (R13.10) Plan: MBS       GO               Colin Mulders., M.S., [place] Acute Rehabilitation Services Office: [phone]  [place] 03/29/2020, 10:23 AM

## 2020-03-30 ENCOUNTER — Inpatient Hospital Stay (HOSPITAL_COMMUNITY): Payer: Medicare HMO

## 2020-03-30 DIAGNOSIS — I63233 Cerebral infarction due to unspecified occlusion or stenosis of bilateral carotid arteries: Secondary | ICD-10-CM

## 2020-03-30 DIAGNOSIS — R1312 Dysphagia, oropharyngeal phase: Secondary | ICD-10-CM

## 2020-03-30 LAB — CBC
HCT: 38.2 % — ABNORMAL LOW (ref 39.0–52.0)
Hemoglobin: 12.9 g/dL — ABNORMAL LOW (ref 13.0–17.0)
MCH: 32.5 pg (ref 26.0–34.0)
MCHC: 33.8 g/dL (ref 30.0–36.0)
MCV: 96.2 fL (ref 80.0–100.0)
Platelets: 245 10*3/uL (ref 150–400)
RBC: 3.97 MIL/uL — ABNORMAL LOW (ref 4.22–5.81)
RDW: 12.8 % (ref 11.5–15.5)
WBC: 10.2 10*3/uL (ref 4.0–10.5)
nRBC: 0 % (ref 0.0–0.2)

## 2020-03-30 LAB — GLUCOSE, CAPILLARY
Glucose-Capillary: 88 mg/dL (ref 70–99)
Glucose-Capillary: 90 mg/dL (ref 70–99)
Glucose-Capillary: 92 mg/dL (ref 70–99)
Glucose-Capillary: 92 mg/dL (ref 70–99)
Glucose-Capillary: 92 mg/dL (ref 70–99)
Glucose-Capillary: 94 mg/dL (ref 70–99)
Glucose-Capillary: 94 mg/dL (ref 70–99)

## 2020-03-30 LAB — BASIC METABOLIC PANEL
Anion gap: 7 (ref 5–15)
BUN: 11 mg/dL (ref 8–23)
CO2: 21 mmol/L — ABNORMAL LOW (ref 22–32)
Calcium: 8.6 mg/dL — ABNORMAL LOW (ref 8.9–10.3)
Chloride: 110 mmol/L (ref 98–111)
Creatinine, Ser: 0.79 mg/dL (ref 0.61–1.24)
GFR calc Af Amer: >60 mL/min (ref >60)
GFR calc non Af Amer: >60 mL/min (ref >60)
Glucose, Bld: 91 mg/dL (ref 70–99)
Potassium: 3.2 mmol/L — ABNORMAL LOW (ref 3.5–5.1)
Sodium: 138 mmol/L (ref 135–145)

## 2020-03-30 MED ORDER — RESOURCE THICKENUP CLEAR PO POWD
ORAL | Status: DC | PRN
  Filled 2020-03-30: qty 125

## 2020-03-30 MED ORDER — POTASSIUM CHLORIDE 20 MEQ/15ML (10%) PO SOLN
40.0000 meq | Freq: Two times a day (BID) | ORAL | Status: AC
  Administered 2020-03-30 (×2): 40 meq via ORAL
  Filled 2020-03-30 (×2): qty 30

## 2020-03-30 NOTE — Progress Notes (Signed)
Modified Barium Swallow Progress Note  Patient Details  Name: Ethan Fitzgerald MRN: [id number] Date of Birth: 21-Mar-1956  Today's Date: 03/30/2020  Modified Barium Swallow completed.  Full report located under Chart Review in the Imaging Section.  Brief recommendations include the following:  Clinical Impression  Pt was seen for a modified barium swallow study and he presents with moderate oropharyngeal dysphagia with resultant silent aspiration of thin liquid (PAS 8) and sensed aspiration of nectar-thick liquid (PAS 6).  Aspiration appeared to be secondary to delayed swallow initiation at the pyriform sinus and delayed laryngeal closure.  No laryngeal penetration or aspiration was observed with honey-thick liquid, puree, or regular solids.  Oral phase was remarkable for anterior spillage on the L side with thin liquid trials, reduced mastication of regular solids (at least partially contributable to edentulism), reduced lingual control resulting in premature spillage to the pharynx, and reduced lingual strength resulting in oral residue.  Pharyngeal phase was remarkable for reduced BOT retraction resulting in vallecular residue, reduced hyolaryngeal elevation/excursion resulting in vallecular and pyriform residue, and reduced epiglottic inversion with all liquid trials resulting in vallecular residue.  Pt was cued to take effortful dry swallows after trials in an attempt to reduce pharyngeal residue, but this was only partially successful.  Pharyngoesophageal phase was remarkable for suspected CP bar around the level of C5-C6 and suspected mild osteophytes at C5 and C6.  No esophageal backflow was observed.    Recommend initiation of Dysphagia 1 (puree) solids and honey-thick liquids (no straws) with medications administered crushed in puree.  Pt will benefit from set up and intermittent supervision to cue for the following compensatory strategies: 1) Small bites/sips 2) Effortful dry swallow  following each bite/sip 3) Slow rate of intake 4) Sit upright as possible.     Swallow Evaluation Recommendations       SLP Diet Recommendations: Dysphagia 1 (Puree) solids;Honey thick liquids   Liquid Administration via: Cup   Medication Administration: Crushed with puree   Supervision: Staff to assist with self feeding;Intermittent supervision to cue for compensatory strategies   Compensations: Slow rate;Small sips/bites;Effortful swallow;Multiple dry swallows after each bite/sip   Postural Changes: Seated upright at 90 degrees   Oral Care Recommendations: Oral care BID   Other Recommendations: Order thickener from pharmacy;Remove water pitcher;Have oral suction available   Colin Mulders., M.S., CCC-SLP Acute Rehabilitation Services Office: [phone]  [place] 03/30/2020,1:12 PM

## 2020-03-30 NOTE — Progress Notes (Signed)
Physical Therapy Treatment Patient Details Name: Ethan Fitzgerald MRN: [id number] DOB: [birth year] Today's Date: 03/30/2020    History of Present Illness 64 year old male with COPD, who came with acute right MCA stroke status post TPA and thrombectomy of bilateral ICA and right M1.  MRI of brain showed cortical/subcortical acute Rt MCA infarct centered within the posterolateral Rt frontal lobe, Rt frontal operculum, Rt insula, and subtly involving portions of the superomedial Rt temporal lobe.  Additional small acute infarcts within the Rt caudate nucleus, Rt pre and postcentral cyri and mid to anterior Lt frontal lobe     PT Comments    Patient recently back to bed after up for several hours, however very willing to get up with PT. Patient remains very impulsive and moves too quickly through his environment (with regards to his left inattention and walking/balance). Walking in hallway he required frequent cues to scan the left side of the hallway to avoid the possibility of running into objects. With head turns (looking for red items), he drifts both to his left and right. He demonstrated decr awareness of his deficits, poor attention to left environment/obstacles, and decr memory with regards to his dysphagia diet (which RN reports a minimum of 3 people had previously discussed with him before PT). With the degree of swallowing, decr cognition, and left inattention feel patient would benefit from intensive inpatient rehab prior to home with wife.    Follow Up Recommendations  CIR;Supervision/Assistance - 24 hour     Equipment Recommendations  None recommended by PT    Recommendations for Other Services       Precautions / Restrictions Precautions Precautions: Fall Precaution Comments: impulsive; Lt inattention     Mobility  Bed Mobility Overal bed mobility: Needs Assistance Bed Mobility: Supine to Sit;Sit to Supine     Supine to sit: Min guard;HOB elevated (has adjustable bed at  home) Sit to supine: Min guard;HOB elevated   General bed mobility comments: close guarding due to IV RUE and condom catheter--pt pays no attention to these lines even once they are brought to his attention. On return to supine, pt's left arm hanging off side of bed and required cues to bring it up onto mattress.  Transfers Overall transfer level: Needs assistance Equipment used: None Transfers: Sit to/from Stand Sit to Stand: Min guard Stand pivot transfers: Min assist       General transfer comment: from EOB; twice he stood and immediately dropped washcloth he was holding in left hand (to take to wipe his mouth). He then had to sit down to reach and pick up the dropped cloth; and repeated the same again.3rd attempt he kept washcloth in his rt hand  Ambulation/Gait Ambulation/Gait assistance: Min assist Gait Distance (Feet): 100 Feet (x 4; required cues to scan environment on his left ) Assistive device: None Gait Pattern/deviations: Step-through pattern;Decreased stride length;Decreased weight shift to left;Decreased dorsiflexion - left;Drifts right/left Gait velocity: faster than is safe for his inattention to left   General Gait Details: as he fatigues, his left foot slides/drags along the floor (wearing hospital slipper socks); anticipate in shoes he would catch his left foot and trip; drifts to right when looking rt and drifts left with looking left   Stairs Stairs: Yes Stairs assistance: Min guard Stair Management: One rail Right;Step to pattern;Forwards Number of Stairs: 2 General stair comments: instructed to step up with left leg (weaker) and he could but also used Orthoptist    Modified Rankin (  Stroke Patients Only) Modified Rankin (Stroke Patients Only) Pre-Morbid Rankin Score: No symptoms Modified Rankin: Moderately severe disability     Balance Overall balance assessment: Needs assistance Sitting-balance support: Feet supported;No upper  extremity supported Sitting balance-Leahy Scale: Fair     Standing balance support: During functional activity;Bilateral upper extremity supported Standing balance-Leahy Scale: Poor Standing balance comment: dependent on physical assist at this time                 Standardized Balance Assessment Standardized Balance Assessment : Dynamic Gait Index   Dynamic Gait Index Level Surface: Mild Impairment Change in Gait Speed: Mild Impairment Gait with Horizontal Head Turns: Mild Impairment Gait with Vertical Head Turns: Mild Impairment Gait and Pivot Turn: Mild Impairment Step Over Obstacle: Mild Impairment Step Around Obstacles: Mild Impairment Steps: Moderate Impairment Total Score: 15      Cognition Arousal/Alertness: Awake/alert Behavior During Therapy: Impulsive Overall Cognitive Status: Impaired/Different from baseline Area of Impairment: Attention;Memory;Safety/judgement;Awareness;Problem solving                   Current Attention Level: Sustained Memory: Decreased recall of precautions;Decreased short-term memory Following Commands: Follows one step commands consistently;Follows multi-step commands inconsistently Safety/Judgement: Decreased awareness of safety;Decreased awareness of deficits Awareness: Emergent Problem Solving: Slow processing;Difficulty sequencing;Requires verbal cues;Requires tactile cues General Comments: Patient very impulsive--moving without regard to his condom catheter or IV in RUE. He could not recall that he was to drink honey-thick liquids (despite RN stating at least 3 people have discussed with him)      Exercises      General Comments General comments (skin integrity, edema, etc.): Wife present throughout.       Pertinent Vitals/Pain Pain Assessment: 0-10 Faces Pain Scale: Hurts even more Pain Location: Lt shoulder posterior and anteriorly.  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 [place]    PT Goals (current goals can now be found in the care plan section) Acute Rehab PT Goals Patient Stated Goal: to go home PT Goal Formulation: With patient/family Time For Goal Achievement: 04/13/20 Potential to Achieve Goals: Good Progress towards PT goals: Goals met and updated - see care plan    Frequency    Min 4X/week      PT Plan Discharge plan needs to be updated    Co-evaluation              AM-PAC PT "6 Clicks" Mobility   Outcome Measure  Help needed turning from your back to your side while in a flat bed without using bedrails?: A Little Help needed moving from lying on your back to sitting on the side of a flat bed without using bedrails?: A Little Help needed moving to and from a bed to a chair (including a wheelchair)?: A Little Help needed standing up from a chair using your arms (e.g., wheelchair or bedside chair)?: A Little Help needed to  walk in hospital room?: A Little Help needed climbing 3-5 steps with a railing? : A Little 6 Click Score: 18    End of Session Equipment Utilized During Treatment: Gait belt Activity Tolerance: Patient tolerated treatment well Patient left: with call bell/phone within reach;with family/visitor present;in bed;with bed alarm set Nurse Communication: Mobility status;Other (comment) (pt not recalling need for honey-thick  liquids) PT Visit Diagnosis: Hemiplegia and hemiparesis;Other symptoms and signs involving the nervous system (R29.898);Other abnormalities of gait and mobility (R26.89) Hemiplegia - Right/Left: Left Hemiplegia - dominant/non-dominant: Non-dominant Hemiplegia - caused by: Cerebral infarction     Time: 8483-5075 PT Time Calculation (min) (ACUTE ONLY): 30 min  Charges:  $Gait Training: 23-37 mins                      Arby Barrette, PT Pager [phone]    Rexanne Mano 03/30/2020, 5:07 PM

## 2020-03-30 NOTE — Progress Notes (Signed)
STROKE TEAM PROGRESS NOTE   INTERVAL HISTORY  Wife at bedside. Pt sitting in chair and complained of left anterior shoulder focal muscle pain likely due to fall prior to the stroke. No swelling or dramatic ROM difficulty to suggest fracture or dislocation. PT/OT recommend CIR.   Vitals:   03/30/20 0001 03/30/20 0437 03/30/20 0748 03/30/20 1233  BP: (!) 154/91 (!) 131/70 (!) 150/93 (!) 141/83  Pulse: 72 70 68 66  Resp: 18 15 18 18   Temp: 97.7 F (36.5 C) 98.4 F (36.9 C) 98.2 F (36.8 C) 98.1 F (36.7 C)  TempSrc: Oral Oral Oral Oral  SpO2: 99% 97% 97% 98%  Weight:      Height:       CBC:  Recent Labs  Lab 03/27/20 0130 03/27/20 0143 03/28/20 0820 03/28/20 0820 03/29/20 0641 03/30/20 0419  WBC 9.0   < > 14.3*   < > 11.2* 10.2  NEUTROABS 6.0  --  9.6*  --   --   --   HGB 13.2   < > 13.2   < > 13.0 12.9*  HCT 39.9   < > 40.8   < > 37.5* 38.2*  MCV 98.5   < > 101.0*   < > 97.7 96.2  PLT 245   < > 248   < > 231 245   < > = values in this interval not displayed.   Basic Metabolic Panel:  Recent Labs  Lab 03/29/20 0641 03/30/20 0419  NA 140 138  K 3.4* 3.2*  CL 110 110  CO2 20* 21*  GLUCOSE 101* 91  BUN 8 11  CREATININE 0.79 0.79  CALCIUM 8.8* 8.6*   Lipid Panel:  Recent Labs  Lab 03/27/20 0823  CHOL 153  TRIG 115  HDL 48  CHOLHDL 3.2  VLDL 23  LDLCALC 82   HgbA1c:  Recent Labs  Lab 03/27/20 0823  HGBA1C 5.7*   Urine Drug Screen:  Recent Labs  Lab 03/29/20 1302  LABOPIA NONE DETECTED  COCAINSCRNUR NONE DETECTED  LABBENZ NONE DETECTED  AMPHETMU NONE DETECTED  THCU NONE DETECTED  LABBARB NONE DETECTED    Alcohol Level  Recent Labs  Lab 03/27/20 0130  ETH <10    IMAGING past 24 hours DG Swallowing Func-Speech Pathology  Result Date: 03/30/2020 Objective Swallowing Evaluation: Type of Study: MBS-Modified Barium Swallow Study  Patient Details Name: ALBERT HERSCH MRN: [id number] Date of Birth: 21-Dec-1955 Today's Date: 03/30/2020 Time: SLP  Start Time (ACUTE ONLY): 8841 -SLP Stop Time (ACUTE ONLY): 1210 SLP Time Calculation (min) (ACUTE ONLY): 25 min Past Medical History: Past Medical History: Diagnosis Date . COPD (chronic obstructive pulmonary disease) (HCC)   mild . Erectile dysfunction  . Headache   daily, stress headaches . Knee pain, left  . Seasonal allergies  . Tobacco dependence  Past Surgical History: Past Surgical History: Procedure Laterality Date . BACK SURGERY    metal plate in back . COLONOSCOPY   . COLONOSCOPY WITH PROPOFOL N/A 04/26/2019  Procedure: COLONOSCOPY WITH PROPOFOL;  Surgeon: Jonathon Bellows, MD;  Location: [facility] ENDOSCOPY;  Service: Gastroenterology;  Laterality: N/A; . HERNIA REPAIR Right  . KNEE ARTHROSCOPY Left 03/28/2015  Procedure: Arthroscopic partial medial meniscectomy plus chondral debridement;  Surgeon: Leanor Kail, MD;  Location: [place];  Service: Orthopedics;  Laterality: Left; . RADIOLOGY WITH ANESTHESIA N/A 03/27/2020  Procedure: IR WITH ANESTHESIA;  Surgeon: Luanne Bras, MD;  Location: [place];  Service: Radiology;  Laterality: N/A; HPI: 64 y.o. male with  history of COPD presenting with L sided weakness.  R MCA infarct s/p tPA and IR with partial revascularization. CT head 7/23: moderate-sized acute right MCA infarct centered in right frontal operculum; Hyperdensity within regional sulci, right sylvian fissure, and interpeduncular cistern may reflect a combination of subarachnoid hemorrhage and contrast.   Subjective: Pt was alert and cooperative Assessment / Plan / Recommendation CHL IP CLINICAL IMPRESSIONS 03/30/2020 Clinical Impression Pt was seen for a modified barium swallow study and he presents with moderate oropharyngeal dysphagia with resultant silent aspiration of thin liquid (PAS 8) and sensed aspiration of nectar-thick liquid (PAS 6).  Aspiration appeared to be secondary to delayed swallow initiation at the pyriform sinus and delayed laryngeal closure.  No laryngeal penetration or  aspiration was observed with honey-thick liquid, puree, or regular solids.  Oral phase was remarkable for anterior spillage on the L side with thin liquid trials, reduced mastication of regular solids (at least partially contributable to edentulism), reduced lingual control resulting in premature spillage to the pharynx, and reduced lingual strength resulting in oral residue.  Pharyngeal phase was remarkable for reduced BOT retraction resulting in vallecular residue, reduced hyolaryngeal elevation/excursion resulting in vallecular and pyriform residue, and reduced epiglottic inversion with all liquid trials resulting in vallecular residue.  Pt was cued to take effortful dry swallows after trials in an attempt to reduce pharyngeal residue, but this was only partially successful.  Pharyngoesophageal phase was remarkable for suspected CP bar around the level of C5-C6 and suspected mild osteophytes at C5 and C6.  No esophageal backflow was observed.  Recommend initiation of Dysphagia 1 (puree) solids and honey-thick liquids (no straws) with medications administered crushed in puree.  Pt will benefit from set up and intermittent supervision to cue for the following compensatory strategies: 1) Small bites/sips 2) Effortful dry swallow following each bite/sip 3) Slow rate of intake 4) Sit upright as possible.   SLP Visit Diagnosis Dysphagia, oropharyngeal phase (R13.12) Attention and concentration deficit following -- Frontal lobe and executive function deficit following -- Impact on safety and function Moderate aspiration risk   CHL IP TREATMENT RECOMMENDATION 03/30/2020 Treatment Recommendations Therapy as outlined in treatment plan below   Prognosis 03/30/2020 Prognosis for Safe Diet Advancement Good Barriers to Reach Goals -- Barriers/Prognosis Comment -- CHL IP DIET RECOMMENDATION 03/30/2020 SLP Diet Recommendations Dysphagia 1 (Puree) solids;Honey thick liquids Liquid Administration via Cup Medication Administration  Crushed with puree Compensations Slow rate;Small sips/bites;Effortful swallow;Multiple dry swallows after each bite/sip Postural Changes Seated upright at 90 degrees   CHL IP OTHER RECOMMENDATIONS 03/30/2020 Recommended Consults -- Oral Care Recommendations Oral care BID Other Recommendations Order thickener from pharmacy;Remove water pitcher;Have oral suction available   CHL IP FOLLOW UP RECOMMENDATIONS 03/30/2020 Follow up Recommendations Inpatient Rehab   CHL IP FREQUENCY AND DURATION 03/30/2020 Speech Therapy Frequency (ACUTE ONLY) min 2x/week Treatment Duration 2 weeks      CHL IP ORAL PHASE 03/30/2020 Oral Phase Impaired Oral - Pudding Teaspoon -- Oral - Pudding Cup -- Oral - Honey Teaspoon -- Oral - Honey Cup Delayed oral transit;Lingual/palatal residue Oral - Nectar Teaspoon -- Oral - Nectar Cup Delayed oral transit;Lingual/palatal residue;Premature spillage Oral - Nectar Straw Premature spillage;Delayed oral transit;Lingual/palatal residue Oral - Thin Teaspoon Left anterior bolus loss;Lingual/palatal residue;Delayed oral transit;Decreased bolus cohesion;Premature spillage Oral - Thin Cup Left anterior bolus loss;Premature spillage;Delayed oral transit;Lingual/palatal residue Oral - Thin Straw Left anterior bolus loss;Premature spillage;Delayed oral transit;Lingual/palatal residue Oral - Puree Weak lingual manipulation;Lingual/palatal residue;Delayed oral transit Oral - Mech Soft --  Oral - Regular Impaired mastication;Weak lingual manipulation;Lingual/palatal residue;Delayed oral transit Oral - Multi-Consistency -- Oral - Pill -- Oral Phase - Comment --  CHL IP PHARYNGEAL PHASE 03/30/2020 Pharyngeal Phase Impaired Pharyngeal- Pudding Teaspoon -- Pharyngeal -- Pharyngeal- Pudding Cup -- Pharyngeal -- Pharyngeal- Honey Teaspoon -- Pharyngeal -- Pharyngeal- Honey Cup Delayed swallow initiation-vallecula;Pharyngeal residue - valleculae;Pharyngeal residue - pyriform;Pharyngeal residue - posterior pharnyx;Reduced  pharyngeal peristalsis;Reduced laryngeal elevation;Reduced airway/laryngeal closure;Reduced epiglottic inversion;Reduced tongue base retraction Pharyngeal Material does not enter airway Pharyngeal- Nectar Teaspoon -- Pharyngeal -- Pharyngeal- Nectar Cup Delayed swallow initiation-vallecula;Penetration/Aspiration during swallow;Reduced anterior laryngeal mobility;Reduced laryngeal elevation;Reduced airway/laryngeal closure;Reduced tongue base retraction;Reduced epiglottic inversion;Pharyngeal residue - valleculae;Pharyngeal residue - pyriform Pharyngeal Material enters airway, remains ABOVE vocal cords and not ejected out Pharyngeal- Nectar Straw Delayed swallow initiation-pyriform sinuses;Pharyngeal residue - valleculae;Penetration/Aspiration during swallow;Reduced epiglottic inversion;Reduced anterior laryngeal mobility;Reduced laryngeal elevation;Reduced airway/laryngeal closure;Reduced tongue base retraction;Trace aspiration Pharyngeal Material enters airway, passes BELOW cords then ejected out Pharyngeal- Thin Teaspoon Delayed swallow initiation-pyriform sinuses;Penetration/Aspiration before swallow;Penetration/Aspiration during swallow;Reduced epiglottic inversion;Reduced anterior laryngeal mobility;Reduced laryngeal elevation;Reduced airway/laryngeal closure;Reduced tongue base retraction;Trace aspiration;Pharyngeal residue - valleculae Pharyngeal Material enters airway, passes BELOW cords without attempt by patient to eject out (silent aspiration) Pharyngeal- Thin Cup Delayed swallow initiation-pyriform sinuses;Penetration/Aspiration before swallow;Penetration/Apiration after swallow;Reduced epiglottic inversion;Reduced anterior laryngeal mobility;Reduced laryngeal elevation;Reduced airway/laryngeal closure;Reduced tongue base retraction Pharyngeal Material enters airway, passes BELOW cords and not ejected out despite cough attempt by patient Pharyngeal- Thin Straw Delayed swallow initiation-pyriform  sinuses;Penetration/Aspiration during swallow;Pharyngeal residue - valleculae;Reduced epiglottic inversion;Reduced anterior laryngeal mobility;Reduced laryngeal elevation;Reduced airway/laryngeal closure;Reduced tongue base retraction Pharyngeal Material enters airway, remains ABOVE vocal cords and not ejected out Pharyngeal- Puree Delayed swallow initiation-vallecula;Pharyngeal residue - valleculae;Pharyngeal residue - pyriform;Reduced anterior laryngeal mobility;Reduced tongue base retraction Pharyngeal Material does not enter airway Pharyngeal- Mechanical Soft -- Pharyngeal -- Pharyngeal- Regular Delayed swallow initiation-vallecula;Reduced anterior laryngeal mobility;Pharyngeal residue - valleculae;Pharyngeal residue - pyriform;Pharyngeal residue - posterior pharnyx Pharyngeal Material does not enter airway Pharyngeal- Multi-consistency -- Pharyngeal -- Pharyngeal- Pill -- Pharyngeal -- Pharyngeal Comment --  CHL IP CERVICAL ESOPHAGEAL PHASE 03/30/2020 Cervical Esophageal Phase WFL Pudding Teaspoon -- Pudding Cup -- Honey Teaspoon -- Honey Cup -- Nectar Teaspoon -- Nectar Cup -- Nectar Straw -- Thin Teaspoon -- Thin Cup -- Thin Straw -- Puree -- Mechanical Soft -- Regular -- Multi-consistency -- Pill -- Cervical Esophageal Comment -- Colin Mulders., M.S., CCC-SLP Acute Rehabilitation Services Office: [phone] Elvia Collum [facility] 03/30/2020, 1:17 PM               PHYSICAL EXAM   Temp:  [97.6 F (36.4 C)-98.4 F (36.9 C)] 98.1 F (36.7 C) (07/25 1233) Pulse Rate:  [66-76] 66 (07/25 1233) Resp:  [15-20] 18 (07/25 1233) BP: (131-154)/(70-93) 141/83 (07/25 1233) SpO2:  [96 %-99 %] 98 % (07/25 1233)  General - Well nourished, well developed, in no apparent distress.  Ophthalmologic - fundi not visualized due to noncooperation.  Cardiovascular - Regular rhythm and rate.  Mental Status -  Level of arousal and orientation to time, place, and person were intact. Language including expression, naming,  repetition, comprehension was assessed and found intact. Mild to moderate dysarthria Fund of Knowledge was assessed and was intact.  Cranial Nerves II - XII - II - Visual field intact OU. III, IV, VI - conjugate gaze, right gaze complete but Left gaze incomplete V - Facial sensation intact bilaterally. VII - left upper and lower facial weakness VIII - Hearing & vestibular intact bilaterally. X - Palate elevates symmetrically.  XI - Chin turning & shoulder shrug intact bilaterally. XII - Tongue protrusion intact.  Motor Strength - The patient's strength was normal in right upper and lower extremities, however left UE 3/5 and LE 4+/5.  Bulk was normal and fasciculations were absent.   Motor Tone - Muscle tone was assessed at the neck and appendages and was normal.  Reflexes - The patient's reflexes were symmetrical in all extremities and he had no pathological reflexes.  Sensory - Light touch, temperature/pinprick were assessed and were symmetrical.    Coordination - The patient had normal movements in the right hand with no ataxia or dysmetria.  Tremor was absent.  Gait and Station - deferred.    ASSESSMENT/PLAN [name] is a 64 y.o. male with history of COPD presenting with L sided weakness. Received tPA 03/27/2020 at 0151.  Stroke:   R MCA and multiple R and L anterior circulation infarcts s/p tPA and IR w/ partial revascularization, most likely secondary to large vessel disease with ? dissection due to coughing episodes, vs. Hypercoagulable state from parotid neoplasm   CT head dense R M1. Subtle asymmetric hypodensity R caudate and lentiform nucleus. ASPECTS 8    CTA head & neck R ICA LVO at bifurcation origin w/ cavernous reconstitution and reocclusion proximal R M1. Good MCA collaterals. L ICA occlusion at bifurcation origin. Short severe L ICA cavernous stenosis.    Cerebral angio partial revascularization R MCA dominant division w/ TICI2a/TICI2b. S/p angioplasty  acute R ICA occlusion w/ reocclusion     Post IR CT mild to mod RT peri-insular and post frontal convexity contrast stain vs SAH   MRI  R MCA frontal lobe infarct w/ possible mild petechial hemorrhage. B ICA siphon high-grade stenosis vs occlusion. R MCA SAH.  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 No source of embolus   LDL 82   HgbA1c 5.6  VTE prophylaxis - SCDs   No antithrombotic prior to admission, now on ASA alone due to small SAH  Therapy recommendations:  CIR  Disposition:  pending   Acute Hypoxemic Respiratory Failure Secondary to stroke  Intubated for IR, left intubated for airway protection following  Extubated 7/23  Tolerated well  Parotid mass  Incidental finding  Could be etiology of stroke  Tend to be slow growing  ENT referral at d/c w/ bx after rehab completed  Carotid stenosis   Chronic L ICA occlusion  Acute R ICA occlusion S/p R ICA angioplasty w/ reocclusion, ? Dissection from cough  Blood Pressure Hypotension . Asystole on IR table . BP stable . Off Neo . Long-term BP goal 130-150 . NS at 75 cc / hr  Hyperlipidemia  Home meds:  crestor 10 and fish oil  LDL 82, goal < 70  Resume home meds once PO access  Continue statin at discharge  Dysphagia . Secondary to stroke . Speech on board . MBS in 7/25 -> cleared for dysphagia I with honey thick liquids . NS at 75->50cc / hr   Other Stroke Risk Factors  Advanced age  Former Cigarette smoker, quit 2 yrs ago  Former smokeless tobacco user  ETOH use, advised to drink no more than 2 drink(s) a day  Other Active Problems  COPD w/o acute exacerbation   Hypokalemia - 3.1->4.7->3.9->3.2 - replete - recheck  Leukocytosis, WBC 16.3->14.3 ->11.2->10.2 (temp 98.1)  R parotid hyperintense lesion suspicious for  primary neoplasm - outpt ENT f/u  Hospital day # 3  I had long  discussion with pt and wife at bedside, updated pt current condition, treatment plan and potential prognosis, and answered all the questions. They expressed understanding and appreciation.   Rosalin Hawking, MD PhD Stroke Neurology 03/30/2020 8:11 PM   To contact Stroke Continuity provider, please refer to [email or web address] After hours, contact General Neurology

## 2020-03-30 NOTE — Evaluation (Signed)
Occupational Therapy Evaluation Patient Details Name: Ethan Fitzgerald MRN: [id number] DOB: Apr 17, 1956 Today's Date: 03/30/2020    History of Present Illness 64 year old male with COPD, who came with acute right MCA stroke status post TPA and thrombectomy of bilateral ICA and right M1.  MRI of brain showed cortical/subcortical acute Rt MCA infarct centered within the posterolateral Rt frontal lobe, Rt frontal operculum, Rt insula, and subtly involving portions of the superomedial Rt temporal lobe.  Additional small acute infarcts within the Rt caudate nucleus, Rt pre and postcentral cyri and mid to anterior Lt frontal lobe    Clinical Impression   Pt admitted with above. He demonstrates the below listed deficits and will benefit from continued OT to maximize safety and independence with BADLs.  Pt presents to OT with Lt hemiparesis - he is able to use Lt UE as a gross assist during ADLs and is Lt hand dominant.  He also complains of pain Lt shoulder resulting in limited AAROM/PROM.  He does endorse a fall in which he landed on his Lt shoulder.  He demonstrates Lt inattention and requires up to mod cues to locate items on his Lt, to attend to items on his left, and to avoid obstacles on his Lt.  He demonstrates poor carry over of precautions and decreased awareness of safety.  Overall, he requires min - mod A for ADLs, and min A for functional transfers, although he is at increased risk for falls/injury due to the Lt inattention.  He is on a Dysphagia 1 diet with honey thin liquids SLP recommendations, and requires mod cues to comply with precautions.  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  Given the severity of his swallowing deficits, coupled with the Lt inattention, feel he would benefit from CIR to reduce care giver burden, as well as for comprehensive education with he and his wife to ensure a successful transition back  home, and to reduce the risk of injury, and readmission.  Will follow acutely       Follow Up Recommendations  CIR;Supervision/Assistance - 24 hour    Equipment Recommendations  Tub/shower bench;3 in 1 bedside commode    Recommendations for Other Services Rehab consult     Precautions / Restrictions Precautions Precautions: Fall Precaution Comments: impulsive; Lt inattention       Mobility Bed Mobility Overal bed mobility: Needs Assistance Bed Mobility: Supine to Sit;Sit to Supine     Supine to sit: HOB elevated;Min guard (has adjustable bed at home)     General bed mobility comments: minA for trunk elevation, HOB elevated, able to bring LEs off EBO  Transfers Overall transfer level: Needs assistance Equipment used: 1 person hand held assist Transfers: Sit to/from Stand Sit to Stand: Mod assist;From elevated surface Stand pivot transfers: Min assist       General transfer comment: modA to power up and steady upon standing, wide base of support    Balance Overall balance assessment: Needs assistance Sitting-balance support: Feet supported;No upper extremity supported Sitting balance-Leahy Scale: Fair     Standing balance support: During functional activity;Bilateral upper extremity supported Standing balance-Leahy Scale: Poor Standing balance comment: dependent on physical assist at this time                 Standardized Balance Assessment Standardized Balance Assessment : Dynamic Gait Index   Dynamic Gait Index Level Surface: Mild Impairment Change in Gait Speed: Mild Impairment  Gait with Horizontal Head Turns: Mild Impairment Gait with Vertical Head Turns: Mild Impairment Gait and Pivot Turn: Mild Impairment Step Over Obstacle: Mild Impairment Step Around Obstacles: Mild Impairment Steps: Mild Impairment Total Score: 16     ADL either performed or assessed with clinical judgement   ADL Overall ADL's : Needs  assistance/impaired Eating/Feeding: Minimal assistance;Sitting Eating/Feeding Details (indicate cue type and reason): requires verbal cues for precautions, assist for set up, and assist for organization and problem solving      Upper Body Bathing: Moderate assistance;Sitting   Lower Body Bathing: Minimal assistance;Sit to/from stand   Upper Body Dressing : Moderate assistance;Sitting   Lower Body Dressing: Moderate assistance;Sit to/from stand   Toilet Transfer: Minimal assistance;Ambulation;Comfort height toilet;Grab bars;RW   Toileting- Clothing Manipulation and Hygiene: Moderate assistance;Sit to/from stand Toileting - Clothing Manipulation Details (indicate cue type and reason): Per nsg staff, pt has been incontinent of stool multiple times      Functional mobility during ADLs: Minimal assistance       Vision Baseline Vision/History: Wears glasses Wears Glasses: Reading only Vision Assessment?: Yes Eye Alignment: Within Functional Limits Ocular Range of Motion: Within Functional Limits Alignment/Gaze Preference: Within Defined Limits Tracking/Visual Pursuits: Able to track stimulus in all quads without difficulty Visual Fields: No apparent deficits Additional Comments: Pt requires mod cues to scan environment for needed items      Perception Perception Perception Tested?: Yes Perception Deficits: Inattention/neglect Inattention/Neglect: Does not attend to left visual field;Does not attend to left side of body Comments: pt required mod cues to locate items on food tray.  He held onto a wrapper in [employer] hand with no awareness, then attempted to hold onto a container and was unsuccessful, but couldn't figure out that he was still holding onto the [place] tested?: Deficits Deficits: Organization    Pertinent Vitals/Pain Pain Assessment: 0-10 Faces Pain Scale: Hurts even more Pain Location: Lt shoulder posterior and anteriorly.  He reports he fell on  the shoulder when this stroke occurred  Pain Descriptors / Indicators: Sore Pain Intervention(s): Limited activity within patient's tolerance     Hand Dominance Left   Extremity/Trunk Assessment Upper Extremity Assessment Upper Extremity Assessment: LUE deficits/detail LUE Deficits / Details: Lt UE Brunnstrom stage 4, moving out of synergies.  He is unable to lift shoulder due to c/o pain, and unable to tolerate AAROM Lt shoulder.  He is able to move hand to mouth with effort, and able to actively extend his elbow. He is able to opose all digits although he requires increased effort to opose little finger.  He uses Lt UE as a gross assist functionally  LUE Sensation: decreased proprioception (assessed through observation ) LUE Coordination: decreased gross motor;decreased fine motor   Lower Extremity Assessment Lower Extremity Assessment: Defer to PT evaluation   Cervical / Trunk Assessment Cervical / Trunk Assessment: Other exceptions Cervical / Trunk Exceptions: Lt passive lateral flexion of Lt trunk when seated    Communication Communication Communication: Expressive difficulties (dysarthria )   Cognition Arousal/Alertness: Awake/alert Behavior During Therapy: Impulsive Overall Cognitive Status: Impaired/Different from baseline Area of Impairment: Attention;Memory;Safety/judgement;Awareness;Problem solving                   Current Attention Level: Sustained Memory: Decreased recall of precautions;Decreased short-term memory Following Commands: Follows one step commands consistently;Follows multi-step commands inconsistently Safety/Judgement: Decreased awareness of safety;Decreased awareness of deficits Awareness: Emergent Problem Solving: Slow processing;Difficulty sequencing;Requires verbal cues;Requires tactile cues General  Comments: Patient very impulsive--moving without regard to his condom catheter or IV in RUE. He could not recall that he was to drink honey-thick  liquids (despite RN stating at least 3 people have discussed with him)   General Comments       Exercises     Shoulder Instructions      Home Living Family/patient expects to be discharged to:: Private residence Living Arrangements: Spouse/significant other Available Help at Discharge: Family Type of Home: House Home Access: Stairs to enter Technical brewer of Steps: 1   [place]: One level     Bathroom Shower/Tub: Occupational psychologist: Handicapped height     Home Equipment: Civil engineer, contracting   Additional Comments: Pt lives with spouse, who is supportive and who is retired   Lives With: Spouse    Prior Functioning/Environment Level of Independence: Independent        Comments: 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 [place]         OT Problem List: Decreased strength;Decreased range of motion;Decreased activity tolerance;Impaired balance (sitting and/or standing);Impaired vision/perception;Decreased coordination;Decreased cognition;Decreased knowledge of use of DME or AE;Decreased safety awareness;Impaired sensation;Impaired UE functional use;Pain      OT Treatment/Interventions: Self-care/ADL training;Neuromuscular education;DME and/or AE instruction;Therapeutic activities;Cognitive remediation/compensation;Visual/perceptual remediation/compensation;Patient/family education;Balance training    OT Goals(Current goals can be found in the care plan section) Acute Rehab OT Goals Patient Stated Goal: to go home OT Goal Formulation: With patient/family Time For Goal Achievement: 04/13/20 Potential to Achieve Goals: Good ADL Goals Pt Will Perform Grooming: with min guard assist;standing Pt Will Perform Upper Body Bathing: with supervision;sitting Pt Will Perform Lower Body Bathing: with min guard assist;sit to/from stand Pt Will Perform Upper Body Dressing: with min assist;sitting Pt Will Perform  Lower Body Dressing: with min assist;sit to/from stand Pt Will Transfer to Toilet: with min guard assist;ambulating;regular height toilet;bedside commode;grab bars Pt Will Perform Toileting - Clothing Manipulation and hygiene: with min guard assist;sit to/from stand Pt/caregiver will Perform Home Exercise Program: Increased ROM;Left upper extremity;With Supervision;With written HEP provided Additional ADL Goal #1: Pt will locate needed ADL items on his Lt with min verbal cues Additional ADL Goal #2: Pt will use Lt UE as an active assist 50% of the time during ADLs  OT Frequency: Min 2X/week   Barriers to D/C:            Co-evaluation              AM-PAC OT "6 Clicks" Daily Activity     Outcome Measure Help from another person eating meals?: A Little Help from another person taking care of personal grooming?: A Little Help from another person toileting, which includes using toliet, bedpan, or urinal?: A Lot Help from another person bathing (including washing, rinsing, drying)?: A Lot Help from another person to put on and taking off regular upper body clothing?: A Lot Help from another person to put on and taking off regular lower body clothing?: A Lot 6 Click Score: 14   End of Session Equipment Utilized During Treatment: Gait belt;Rolling walker Nurse Communication: Mobility status  Activity Tolerance: Patient tolerated treatment well Patient left: in chair;with chair alarm set;with family/visitor present  OT Visit Diagnosis: Unsteadiness on feet (R26.81);Hemiplegia and hemiparesis;Cognitive communication deficit (R41.841) Symptoms and signs involving cognitive functions: Cerebral infarction Hemiplegia - Right/Left: Left Hemiplegia - dominant/non-dominant: Dominant Hemiplegia - caused by: Cerebral infarction  Time: 7017-7939 OT Time Calculation (min): 43 min Charges:  OT General Charges $OT Visit: 1 Visit OT Evaluation $OT Eval Moderate Complexity: 1  Mod OT Treatments $Self Care/Home Management : 23-37 mins  Nilsa Nutting., OTR/L Acute Rehabilitation Services Pager [phone] Office [place], [place] 03/30/2020, 5:00 PM

## 2020-03-31 ENCOUNTER — Inpatient Hospital Stay (HOSPITAL_COMMUNITY): Payer: Medicare HMO

## 2020-03-31 DIAGNOSIS — M25512 Pain in left shoulder: Secondary | ICD-10-CM

## 2020-03-31 DIAGNOSIS — I959 Hypotension, unspecified: Secondary | ICD-10-CM | POA: Diagnosis present

## 2020-03-31 DIAGNOSIS — E876 Hypokalemia: Secondary | ICD-10-CM | POA: Diagnosis present

## 2020-03-31 DIAGNOSIS — K118 Other diseases of salivary glands: Secondary | ICD-10-CM

## 2020-03-31 DIAGNOSIS — J441 Chronic obstructive pulmonary disease with (acute) exacerbation: Secondary | ICD-10-CM | POA: Diagnosis present

## 2020-03-31 DIAGNOSIS — E782 Mixed hyperlipidemia: Secondary | ICD-10-CM

## 2020-03-31 DIAGNOSIS — I6529 Occlusion and stenosis of unspecified carotid artery: Secondary | ICD-10-CM | POA: Diagnosis present

## 2020-03-31 DIAGNOSIS — I69391 Dysphagia following cerebral infarction: Secondary | ICD-10-CM

## 2020-03-31 DIAGNOSIS — M25511 Pain in right shoulder: Secondary | ICD-10-CM | POA: Diagnosis present

## 2020-03-31 DIAGNOSIS — I6300 Cerebral infarction due to thrombosis of unspecified precerebral artery: Secondary | ICD-10-CM

## 2020-03-31 DIAGNOSIS — I609 Nontraumatic subarachnoid hemorrhage, unspecified: Secondary | ICD-10-CM

## 2020-03-31 DIAGNOSIS — I63231 Cerebral infarction due to unspecified occlusion or stenosis of right carotid arteries: Secondary | ICD-10-CM

## 2020-03-31 DIAGNOSIS — J449 Chronic obstructive pulmonary disease, unspecified: Secondary | ICD-10-CM | POA: Diagnosis present

## 2020-03-31 LAB — BASIC METABOLIC PANEL
Anion gap: 7 (ref 5–15)
BUN: 13 mg/dL (ref 8–23)
CO2: 21 mmol/L — ABNORMAL LOW (ref 22–32)
Calcium: 8.8 mg/dL — ABNORMAL LOW (ref 8.9–10.3)
Chloride: 107 mmol/L (ref 98–111)
Creatinine, Ser: 0.83 mg/dL (ref 0.61–1.24)
GFR calc Af Amer: >60 mL/min (ref >60)
GFR calc non Af Amer: >60 mL/min (ref >60)
Glucose, Bld: 99 mg/dL (ref 70–99)
Potassium: 3.6 mmol/L (ref 3.5–5.1)
Sodium: 135 mmol/L (ref 135–145)

## 2020-03-31 LAB — GLUCOSE, CAPILLARY
Glucose-Capillary: 82 mg/dL (ref 70–99)
Glucose-Capillary: 108 mg/dL — ABNORMAL HIGH (ref 70–99)
Glucose-Capillary: 107 mg/dL — ABNORMAL HIGH (ref 70–99)
Glucose-Capillary: 93 mg/dL (ref 70–99)
Glucose-Capillary: 83 mg/dL (ref 70–99)

## 2020-03-31 LAB — CBC
HCT: 38.1 % — ABNORMAL LOW (ref 39.0–52.0)
Hemoglobin: 13.2 g/dL (ref 13.0–17.0)
MCH: 32.7 pg (ref 26.0–34.0)
MCHC: 34.6 g/dL (ref 30.0–36.0)
MCV: 94.3 fL (ref 80.0–100.0)
Platelets: 239 10*3/uL (ref 150–400)
RBC: 4.04 MIL/uL — ABNORMAL LOW (ref 4.22–5.81)
RDW: 12.5 % (ref 11.5–15.5)
WBC: 8.2 10*3/uL (ref 4.0–10.5)
nRBC: 0 % (ref 0.0–0.2)

## 2020-03-31 MED ORDER — PANTOPRAZOLE SODIUM 40 MG PO TBEC
40.0000 mg | DELAYED_RELEASE_TABLET | Freq: Every day | ORAL | Status: DC
  Administered 2020-03-31: 40 mg via ORAL
  Filled 2020-03-31: qty 1

## 2020-03-31 MED ORDER — ROSUVASTATIN CALCIUM 20 MG PO TABS
20.0000 mg | ORAL_TABLET | Freq: Every day | ORAL | Status: DC
  Administered 2020-04-01: 20 mg via ORAL
  Filled 2020-03-31: qty 1

## 2020-03-31 MED ORDER — ASPIRIN 300 MG RE SUPP: 300.0000 mg | Freq: Every day | RECTAL | Status: DC

## 2020-03-31 MED ORDER — DOCUSATE SODIUM 100 MG PO CAPS
100.0000 mg | ORAL_CAPSULE | Freq: Two times a day (BID) | ORAL | Status: DC
  Administered 2020-03-31 – 2020-04-01 (×3): 100 mg via ORAL
  Filled 2020-03-31 (×3): qty 1

## 2020-03-31 MED ORDER — ROSUVASTATIN CALCIUM 5 MG PO TABS
10.0000 mg | ORAL_TABLET | Freq: Every day | ORAL | Status: DC
  Administered 2020-03-31: 10 mg via ORAL
  Filled 2020-03-31: qty 2

## 2020-03-31 MED ORDER — ASPIRIN 81 MG PO CHEW
324.0000 mg | CHEWABLE_TABLET | Freq: Every day | ORAL | Status: DC
  Administered 2020-03-31 – 2020-04-01 (×2): 324 mg via ORAL
  Filled 2020-03-31 (×2): qty 4

## 2020-03-31 NOTE — Progress Notes (Signed)
  Speech Language Pathology Treatment: Dysphagia  Patient Details Name: FLAVIO LINDROTH MRN: [id number] DOB: November 19, 1955 Today's Date: 03/31/2020 Time: 8325-4982 SLP Time Calculation (min) (ACUTE ONLY): 30 min  Assessment / Plan / Recommendation Clinical Impression  SLP visit to assess po tolerance and to educate pt to findings/clinical reasoning for diet.  Pt's breakfast tray in room, not hardly touched.  He reports he attempted to eat but couldn't due to displeasure but he did consume his cranberry juice.  SLP showed pt his study vs a normal MBS on the computer to improve his comprehension of his current dietary restrictions using teach back. He benefits from moderate verbal/visual cues to attend to his left side.  Slight coating noted on tongue - and pt has been on a Zpac prior to admit but he denies discomfort.  SLP reviewed with pt importance of cough/"hock" strength to protect his airway and clear upper pharyngeal retention.  He demonstrated this ability with 5/5 opportunites thus consideration for dietary advancement soon is reasonable.  Education ongoing.    HPI HPI: 64 y.o. male with history of COPD presenting with L sided weakness.  R MCA infarct s/p tPA and IR with partial revascularization. CT head 7/23: moderate-sized acute right MCA infarct centered in right frontal operculum; Hyperdensity within regional sulci, right sylvian fissure, and interpeduncular cistern may reflect a combination of subarachnoid hemorrhage and contrast.        SLP Plan  Continue with current plan of care       Recommendations  Diet recommendations: Dysphagia 1 (puree) Liquids provided via: Cup;No straw Medication Administration: Whole meds with puree (crush if large) Supervision: Staff to assist with self feeding;Full supervision/cueing for compensatory strategies Compensations: Slow rate;Small sips/bites;Other (Comment);Follow solids with liquid ("cough and expectorate" at end of meal to clear  pharynx) Postural Changes and/or Swallow Maneuvers: Seated upright 90 degrees;Upright 30-60 min after meal                Oral Care Recommendations: Oral care QID;Oral care before and after PO Follow up Recommendations: Inpatient Rehab SLP Visit Diagnosis: Dysphagia, oropharyngeal phase (R13.12) Plan: Continue with current plan of care       [place], Shjon Lizarraga Ann 03/31/2020, 11:23 AM  Kathleen Lime, MS [place] Office [phone]

## 2020-03-31 NOTE — Progress Notes (Signed)
Inpatient Rehabilitation-Admissions Coordinator    I met with pt and his wife at the bedside to discuss recommended rehab program. Discussed expectations, anticipated LOS, and expected functional outcomes. Pt and his wife are very interested in this program and would like to proceed if insurance approves. I have confirmed DC support and will begin insurance authorization process for possible admit.   Please call if questions.   Raechel Ache, OTR/L  Rehab Admissions Coordinator  [phone] 03/31/2020 4:05 PM

## 2020-03-31 NOTE — Progress Notes (Signed)
Inpatient Rehab Admissions Coordinator Note:   Per PT/OT recommendations, pt was screened for CIR candidacy by Gayland Curry, MS, CCC-SLP.  At this time we are recommending an Inpatient Rehab consult.  AC will contact attending for consult order.  Please contact me with questions.    Gayland Curry, MS, CCC-SLP Admissions Coordinator [phone] 03/31/20 10:14 AM

## 2020-03-31 NOTE — Progress Notes (Signed)
Occupational Therapy Treatment Patient Details Name: Ethan Fitzgerald MRN: [id number] DOB: 1955-12-26 Today's Date: 03/31/2020    History of present illness 64 year old male with COPD, who came with acute right MCA stroke status post TPA and thrombectomy of bilateral ICA and right M1.  MRI of brain showed cortical/subcortical acute Rt MCA infarct centered within the posterolateral Rt frontal lobe, Rt frontal operculum, Rt insula, and subtly involving portions of the superomedial Rt temporal lobe.  Additional small acute infarcts within the Rt caudate nucleus, Rt pre and postcentral cyri and mid to anterior Lt frontal lobe    OT comments  Upon arrival pt fatigued and agreeable to skilled-OT services. Pt performed bed mobility and transfers with min guard. Pt able to perform grooming at sink with min A with mod verbal cues to scan to left for soap and paper towels. Pt needing Min A for mobility with RW and verbal cues for safety and sequencing with sitting in recliner. Pt able to move wrist and elbow without pain but continues to report pain in L shoulder and limits movement in shoulder during functional activities despite it being his dominant hand. Reports no pain when putting pressure through his L arm with using walker during mobility and pushing on arms of chair when adjusting in recliner. Pt impulsive walking very fast and wanting to get up prior to adjusting lines. Pt unaware his condom cath falling off and he wet EOB before getting up. Pt left in recliner with chair alarm and call bell/phone within reach. Pt with no further questions for therapist - dc plans remain appropriate. Will follow acutely.    Follow Up Recommendations  CIR    Equipment Recommendations  Tub/shower bench;3 in 1 bedside commode    Recommendations for Other Services Rehab consult    Precautions / Restrictions Precautions Precautions: Fall Precaution Comments: impulsive; Lt inattention        Mobility Bed  Mobility Overal bed mobility: Needs Assistance Bed Mobility: Supine to Sit     Supine to sit: HOB elevated;Min guard     General bed mobility comments: min guard for safety and needing verbal cues to come to EOB and manage lines  Transfers Overall transfer level: Needs assistance Equipment used: Rolling walker (2 wheeled) Transfers: Sit to/from Stand Sit to Stand: Min guard         General transfer comment: Min guard for safety with balance    Balance Overall balance assessment: Needs assistance Sitting-balance support: Feet supported;No upper extremity supported Sitting balance-Leahy Scale: Fair     Standing balance support: During functional activity;Bilateral upper extremity supported Standing balance-Leahy Scale: Poor Standing balance comment: dependent on physical assist at this time                           ADL either performed or assessed with clinical judgement   ADL Overall ADL's : Needs assistance/impaired     Grooming: Minimal assistance;Cueing for sequencing;Cueing for safety;Standing;Wash/dry hands Grooming Details (indicate cue type and reason): Min A with min guard for safety but mod multimodal cues for locating objects on L side when washing hands                 Toilet Transfer: Ambulation;RW;Minimal assistance;Cueing for sequencing Toilet Transfer Details (indicate cue type and reason): simulated to recliner with Min A for balance and verbal cues for seqeuncing         Functional mobility during ADLs: Minimal assistance;Rolling walker;Cueing for safety;Cueing  for sequencing General ADL Comments: Pt with inattention to L side and needing verbal cues for safety and sequencing due to deficits on L side.      Vision   Additional Comments: Required mod cues to scan environment for obstacles and items for functional activities          Cognition Arousal/Alertness: Awake/alert Behavior During Therapy: Impulsive Overall Cognitive  Status: Impaired/Different from baseline Area of Impairment: Attention;Memory;Safety/judgement;Awareness;Problem solving                   Current Attention Level: Sustained Memory: Decreased recall of precautions Following Commands: Follows one step commands consistently;Follows multi-step commands inconsistently Safety/Judgement: Decreased awareness of safety;Decreased awareness of deficits Awareness: Emergent Problem Solving: Slow processing;Difficulty sequencing;Requires verbal cues;Requires tactile cues General Comments: Pt impulsive walking very fast and wanting to get up prior to adjusting lines. Pt unaware his condom cath falling off and he wet EOB.              General Comments Wife present throughout treatment session.    Pertinent Vitals/ Pain       Pain Assessment: Faces Faces Pain Scale: Hurts a little bit Pain Location: L shoulder Pain Descriptors / Indicators: Guarding;Grimacing;Discomfort Pain Intervention(s): Limited activity within patient's tolerance;Monitored during session;Repositioned         Frequency  Min 2X/week        Progress Toward Goals  OT Goals(current goals can now be found in the care plan section)  Progress towards OT goals: Progressing toward goals  Acute Rehab OT Goals Patient Stated Goal: to go home OT Goal Formulation: With patient/family Time For Goal Achievement: 04/13/20 Potential to Achieve Goals: Good  Plan Discharge plan remains appropriate       AM-PAC OT "6 Clicks" Daily Activity     Outcome Measure   Help from another person eating meals?: A Little Help from another person taking care of personal grooming?: A Little Help from another person toileting, which includes using toliet, bedpan, or urinal?: A Lot Help from another person bathing (including washing, rinsing, drying)?: A Lot Help from another person to put on and taking off regular upper body clothing?: A Lot Help from another person to put on and  taking off regular lower body clothing?: A Lot 6 Click Score: 14    End of Session Equipment Utilized During Treatment: Gait belt;Rolling walker  OT Visit Diagnosis: Unsteadiness on feet (R26.81);Hemiplegia and hemiparesis;Cognitive communication deficit (R41.841) Symptoms and signs involving cognitive functions: Cerebral infarction Hemiplegia - Right/Left: Left Hemiplegia - dominant/non-dominant: Dominant Hemiplegia - caused by: Cerebral infarction   Activity Tolerance Patient tolerated treatment well   Patient Left in chair;with chair alarm set;with family/visitor present;with call bell/phone within reach   Nurse Communication Mobility status        Time: 2549-8264 OT Time Calculation (min): 21 min  Charges: OT General Charges $OT Visit: 1 Visit OT Treatments $Self Care/Home Management : 8-22 mins  Kennie Snedden/OTS   Adrine Hayworth 03/31/2020, 1:08 PM

## 2020-03-31 NOTE — Consult Note (Signed)
Physical Medicine and Rehabilitation Consult   Reason for Consult: Stroke with functional deficits.  Referring Physician: Dr. Erlinda Hong   HPI: Ethan Fitzgerald is a 64 y.o. LH-male with history of COPD, recent bronchitis, HA who was admitted on 03/27/20 with left sided weakness and difficulty speaking. CT head showed dense M1 segment with occlusive thrombus, occlusion of L-ICA age indeterminate with distal reconstitution at cavernous segment and good perfusion of L-MCA and B-ACAs, He underwent cerebral angio with partial revascularization of R-MCA dominant division with revascularization and balloon angioplasty of acutely occluded proximal R-ICA with reocclusion. Follow up MRI brain showed acute cortical/subcortical R-MCA infarct, additional small infarcts in right caudate nucleus, right pre and postcentral gyri and mid to anterior left frontal lobe, SAH and incidental noted of right parotid neoplasm. Follow up CT  Head showed unchanged acute R-MCA infarct with stable SAH.  2D echo technically difficult and showed EF 55-60%.   He tolerated extubation on 07/23 and dysphagia 1, honey liquids due to moderate oropharyngeal dysphagia. Dr. Leonie Man questioned that stroke due to dissection of large vessel due to coughing episodes v/s hypercoagulable state from parotid neoplasm--on ASA alone due to small SAH.  Pain left shoulder felt to be due to focal muscle strain from fall prior to stroke. Therapy evaluations completed revealing left sided left inattention, poor safety awareness with impulsivity affecting mobility and ADLs. CIR recommended due to functional deficits.    Review of Systems  Constitutional: Negative for chills and fever.  HENT: Negative for hearing loss and tinnitus.   Eyes: Negative for blurred vision, double vision, pain and discharge.  Respiratory: Positive for cough. Negative for sputum production and shortness of breath.   Cardiovascular: Negative for chest pain and palpitations.    Gastrointestinal: Positive for heartburn. Negative for constipation.  Genitourinary: Negative for frequency and urgency.  Musculoskeletal: Positive for joint pain (left shoulder) and myalgias.  Skin: Negative for rash.  Neurological: Positive for speech change and focal weakness.  Psychiatric/Behavioral: The patient does not have insomnia.       Past Medical History:  Diagnosis Date  . COPD (chronic obstructive pulmonary disease) (HCC)    mild  . Erectile dysfunction   . Headache    daily, stress headaches  . Knee pain, left   . Seasonal allergies   . Tobacco dependence     Past Surgical History:  Procedure Laterality Date  . BACK SURGERY     metal plate in back  . COLONOSCOPY    . COLONOSCOPY WITH PROPOFOL N/A 04/26/2019   Procedure: COLONOSCOPY WITH PROPOFOL;  Surgeon: Jonathon Bellows, MD;  Location[facility] ENDOSCOPY;  Service: Gastroenterology;  Laterality: N/A;  . HERNIA REPAIR Right   . IR ANGIO INTRA EXTRACRAN SEL COM CAROTID INNOMINATE UNI L MOD SED  03/27/2020  . IR ANGIO VERTEBRAL SEL SUBCLAVIAN INNOMINATE UNI L MOD SED  03/27/2020  . IR ANGIO VERTEBRAL SEL VERTEBRAL UNI R MOD SED  03/27/2020  . IR CT HEAD LTD  03/27/2020  . IR PERCUTANEOUS ART THROMBECTOMY/INFUSION INTRACRANIAL INC DIAG ANGIO  03/27/2020  . KNEE ARTHROSCOPY Left 03/28/2015   Procedure: Arthroscopic partial medial meniscectomy plus chondral debridement;  Surgeon: Leanor Kail, MD;  Location: [place];  Service: Orthopedics;  Laterality: Left;  . RADIOLOGY WITH ANESTHESIA N/A 03/27/2020   Procedure: IR WITH ANESTHESIA;  Surgeon: Luanne Bras, MD;  Location: [place];  Service: Radiology;  Laterality: N/A;    Family History  Problem Relation Age of Onset  .  Diabetes Mother   . Heart attack Mother 31  . Cancer Father        liver   . COPD Brother   . HIV/AIDS Brother   . Prostate cancer Neg Hx   . Colon cancer Neg Hx     Social History:  Married. He works in [place] for a [employer]. He  reports that he quit smoking about 2 years ago. His smoking use included cigarettes. He started smoking about 51 years ago. He has a 73.50 pack-year smoking history. He has quit using smokeless tobacco. He reports current alcohol use--6 pack on the weekend. He reports that he does not use drugs.    Allergies: No Known Allergies    Medications Prior to Admission  Medication Sig Dispense Refill  . albuterol (VENTOLIN HFA) 108 (90 Base) MCG/ACT inhaler Inhale 1-2 puffs into the lungs every 4 (four) hours as needed for wheezing or shortness of breath. 18 g 2  . Aspirin-Acetaminophen-Caffeine (EXCEDRIN PO) Take 2 tablets by mouth. am    . guaiFENesin-codeine (ROBITUSSIN AC) 100-10 MG/5ML syrup Take 5-10 mLs by mouth 3 (three) times daily as needed for cough. 180 mL 0  . Omega-3 Fatty Acids (FISH OIL) 1000 MG CAPS Take 2,000 mg by mouth in the morning and at bedtime.    . predniSONE (DELTASONE) 50 MG tablet Take 1 tablet (50 mg total) by mouth daily with breakfast. 5 tablet 0  . rosuvastatin (CRESTOR) 10 MG tablet Take 2 tablets (20 mg total) by mouth daily. 90 tablet 1  . Tiotropium Bromide-Olodaterol (STIOLTO RESPIMAT) 2.5-2.5 MCG/ACT AERS Inhale 2 puffs into the lungs daily. 4 g 6  . vitamin C (ASCORBIC ACID) 500 MG tablet Take 500 mg by mouth daily. Am    . azithromycin (ZITHROMAX Z-PAK) 250 MG tablet Take 2 tabs (500mg  total) on Day 1. Take 1 tab (250mg ) daily for next 4 days. (Patient not taking: Reported on 03/27/2020) 6 tablet 0  . cyclobenzaprine (FLEXERIL) 10 MG tablet Take 1 tablet (10 mg total) by mouth 3 (three) times daily as needed for muscle spasms. (Patient not taking: Reported on 03/27/2020) 60 tablet 1  . naproxen (NAPROSYN) 500 MG tablet TAKE 1 TABLET (500 MG TOTAL) BY MOUTH 2 (TWO) TIMES DAILY WITH A MEAL. FOR 2-4 WEEKS THEN AS NEEDED (Patient not taking: Reported on 03/27/2020) 60 tablet 2  . sildenafil (REVATIO) 20 MG tablet TAKE 1 TO 5 TABLETS BY MOUTH ABOUT 30 MINUTES  PRIOR TO SEX.START WITH 1 THEN INCREASE (Patient not taking: Reported on 03/27/2020) 30 tablet 5    Home: [place] expects to be discharged to:: Private residence Living Arrangements: Spouse/significant other Available Help at Discharge: Family Type of Home: House Home Access: Stairs to enter Technical brewer of Steps: 1 [place]: One level Bathroom Shower/Tub: Multimedia programmer: Handicapped height Home Equipment: Civil engineer, contracting Additional Comments: Pt lives with spouse, who is supportive and who is retired   Lives With: Spouse  Functional History: Prior Function Level of Independence: Independent Comments: 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 On return to supine, pt's left arm hanging off side of bed and required cues to bring it up onto mattress. Transfers Overall transfer level: Needs assistance Equipment used: None Transfers: Sit to/from Stand Sit to Stand: Min guard Stand pivot transfers: Min assist General transfer comment: from EOB; twice he stood and immediately dropped washcloth he was holding in left hand (to take to wipe his mouth). He then had to sit down to reach and pick up the dropped cloth; and repeated the same again.3rd attempt he kept washcloth in his rt hand Ambulation/Gait Ambulation/Gait assistance: Min assist Gait Distance (Feet): 100 Feet (x 4; required cues to scan environment on his left ) Assistive device: None Gait Pattern/deviations:  Step-through pattern, Decreased stride length, Decreased weight shift to left, Decreased dorsiflexion - left, Drifts right/left General Gait Details: as he fatigues, his left foot slides/drags along the floor (wearing hospital slipper socks); anticipate in shoes he would catch his left foot and trip; drifts to right when looking rt and drifts left with looking left Gait velocity: faster than is safe for his inattention to left Gait velocity interpretation: <1.8 ft/sec, indicate of risk for recurrent falls Stairs: Yes Stairs assistance: Min guard Stair Management: One rail Right, Step to pattern, Forwards Number of Stairs: 2 General stair comments: instructed to step up with left leg (weaker) and he could but also used rail    ADL: ADL Overall ADL's : Needs assistance/impaired Eating/Feeding: Minimal assistance, Sitting Eating/Feeding Details (indicate cue type and reason): requires verbal cues for precautions, assist for set up, and assist for organization and problem solving  Upper Body Bathing: Moderate assistance, Sitting Lower Body Bathing: Minimal assistance, Sit to/from stand Upper Body Dressing : Moderate assistance, Sitting Lower Body Dressing: Moderate assistance, Sit to/from stand Toilet Transfer: Minimal assistance, Ambulation, Comfort height toilet, Grab bars, RW Toileting- Clothing Manipulation and Hygiene: Moderate assistance, Sit to/from stand Toileting - Clothing Manipulation Details (indicate cue type and reason): Per nsg staff, pt has been incontinent of stool multiple times  Functional mobility during ADLs: Minimal assistance  Cognition: Cognition Overall Cognitive Status: Impaired/Different from baseline Arousal/Alertness: Lethargic Orientation Level: Oriented X4 Attention: Focused, Sustained Focused Attention: Appears intact Sustained Attention: Appears intact Immediate Memory Recall: Sheila Oats, Bed Memory Recall Sock: Without Cue Memory Recall Blue: Without  Cue Memory Recall Bed: Not able to recall Awareness: Appears intact Problem Solving: Appears intact Safety/Judgment: Appears intact Cognition Arousal/Alertness: Awake/alert Behavior During Therapy: Impulsive Overall Cognitive Status: Impaired/Different from baseline Area of Impairment: Attention, Memory, Safety/judgement, Awareness, Problem solving Current Attention Level: Sustained Memory: Decreased recall of precautions, Decreased short-term memory Following Commands: Follows one step commands consistently, Follows multi-step commands inconsistently Safety/Judgement: Decreased awareness of safety, Decreased awareness of deficits Awareness: Emergent Problem Solving: Slow processing, Difficulty sequencing, Requires verbal cues, Requires tactile cues General Comments: Patient very impulsive--moving without regard to his condom catheter or IV in RUE. He could not recall that he was to drink honey-thick liquids (despite RN stating at least 3 people have discussed with him)   Blood pressure (!) 151/96, pulse 77, temperature 98.5 F (36.9 C), temperature source Oral, resp. rate 18, height 6' (1.829 m), weight 78.9 kg, SpO2 97 %. Physical Exam Vitals and nursing note reviewed.  Constitutional:      Appearance: Normal appearance.  HENT:     Head: Normocephalic and atraumatic.     Mouth/Throat:     Mouth: Mucous membranes are dry.  Cardiovascular:  Rate and Rhythm: Normal rate and regular rhythm.     Heart sounds: Normal heart sounds. No murmur heard.   Pulmonary:     Effort: Pulmonary effort is normal.     Breath sounds: Wheezing and rhonchi present.  Abdominal:     General: Abdomen is flat. Bowel sounds are normal. There is no distension.     Palpations: Abdomen is soft. There is no mass.  Musculoskeletal:     Comments: Left shoulder with AC tenderness.   Neurological:     Mental Status: He is alert and oriented to person, place, and time.     Comments: Left facial weakness  with mild to moderate dysarthria depending on rate of speech. Mild left inattention but able to look to left field without cues. Left sided weakness.   5/5 right deltoid bicep tricep grip hip flexor knee extensor ankle dorsiflexor 3 - left deltoid bicep tricep finger flexors and extensors 4 - at the left hip flexor, 4/5 in the left quad, for management left ankle dorsiflexor and plantar flexor Sensation equal to light touch bilateral upper and lower limbs There is no evidence of left neglect or field cut on confrontation testing  Psychiatric:        Mood and Affect: Mood normal.        Behavior: Behavior normal.   Pain at 90 degrees of abduction of left shoulder no pain with external rotation there is pain with internal rotation Results for orders placed or performed during the hospital encounter of 03/27/20 (from the past 24 hour(s))  Glucose, capillary     Status: None   Collection Time: 03/30/20 12:30 PM  Result Value Ref Range   Glucose-Capillary 92 70 - 99 mg/dL  Glucose, capillary     Status: None   Collection Time: 03/30/20  5:24 PM  Result Value Ref Range   Glucose-Capillary 94 70 - 99 mg/dL  Glucose, capillary     Status: None   Collection Time: 03/30/20  8:13 PM  Result Value Ref Range   Glucose-Capillary 92 70 - 99 mg/dL  Glucose, capillary     Status: None   Collection Time: 03/30/20 11:22 PM  Result Value Ref Range   Glucose-Capillary 94 70 - 99 mg/dL  Glucose, capillary     Status: None   Collection Time: 03/31/20  3:07 AM  Result Value Ref Range   Glucose-Capillary 82 70 - 99 mg/dL  CBC     Status: Abnormal   Collection Time: 03/31/20  3:25 AM  Result Value Ref Range   WBC 8.2 4.0 - 10.5 K/uL   RBC 4.04 (L) 4.22 - 5.81 MIL/uL   Hemoglobin 13.2 13.0 - 17.0 g/dL   HCT 38.1 (L) 39 - 52 %   MCV 94.3 80.0 - 100.0 fL   MCH 32.7 26.0 - 34.0 pg   MCHC 34.6 30.0 - 36.0 g/dL   RDW 12.5 11.5 - 15.5 %   Platelets 239 150 - 400 K/uL   nRBC 0.0 0.0 - 0.2 %  Basic  metabolic panel     Status: Abnormal   Collection Time: 03/31/20  3:25 AM  Result Value Ref Range   Sodium 135 135 - 145 mmol/L   Potassium 3.6 3.5 - 5.1 mmol/L   Chloride 107 98 - 111 mmol/L   CO2 21 (L) 22 - 32 mmol/L   Glucose, Bld 99 70 - 99 mg/dL   BUN 13 8 - 23 mg/dL   Creatinine, Ser 0.83 0.61 - 1.24  mg/dL   Calcium 8.8 (L) 8.9 - 10.3 mg/dL   GFR calc non Af Amer >60 >60 mL/min   GFR calc Af Amer >60 >60 mL/min   Anion gap 7 5 - 15  Glucose, capillary     Status: Abnormal   Collection Time: 03/31/20  7:47 AM  Result Value Ref Range   Glucose-Capillary 108 (H) 70 - 99 mg/dL   DG Swallowing Func-Speech Pathology  Result Date: 03/30/2020 Objective Swallowing Evaluation: Type of Study: MBS-Modified Barium Swallow Study  Patient Details Name: Ethan Fitzgerald MRN: [id number] Date of Birth: 07-01-1956 Today's Date: 03/30/2020 Time: SLP Start Time (ACUTE ONLY): 7846 -SLP Stop Time (ACUTE ONLY): 1210 SLP Time Calculation (min) (ACUTE ONLY): 25 min Past Medical History: Past Medical History: Diagnosis Date . COPD (chronic obstructive pulmonary disease) (HCC)   mild . Erectile dysfunction  . Headache   daily, stress headaches . Knee pain, left  . Seasonal allergies  . Tobacco dependence  Past Surgical History: Past Surgical History: Procedure Laterality Date . BACK SURGERY    metal plate in back . COLONOSCOPY   . COLONOSCOPY WITH PROPOFOL N/A 04/26/2019  Procedure: COLONOSCOPY WITH PROPOFOL;  Surgeon: Jonathon Bellows, MD;  Location: [facility] ENDOSCOPY;  Service: Gastroenterology;  Laterality: N/A; . HERNIA REPAIR Right  . KNEE ARTHROSCOPY Left 03/28/2015  Procedure: Arthroscopic partial medial meniscectomy plus chondral debridement;  Surgeon: Leanor Kail, MD;  Location: [place];  Service: Orthopedics;  Laterality: Left; . RADIOLOGY WITH ANESTHESIA N/A 03/27/2020  Procedure: IR WITH ANESTHESIA;  Surgeon: Luanne Bras, MD;  Location: [place];  Service: Radiology;  Laterality: N/A; HPI: 64 y.o.  male with history of COPD presenting with L sided weakness.  R MCA infarct s/p tPA and IR with partial revascularization. CT head 7/23: moderate-sized acute right MCA infarct centered in right frontal operculum; Hyperdensity within regional sulci, right sylvian fissure, and interpeduncular cistern may reflect a combination of subarachnoid hemorrhage and contrast.   Subjective: Pt was alert and cooperative Assessment / Plan / Recommendation CHL IP CLINICAL IMPRESSIONS 03/30/2020 Clinical Impression Pt was seen for a modified barium swallow study and he presents with moderate oropharyngeal dysphagia with resultant silent aspiration of thin liquid (PAS 8) and sensed aspiration of nectar-thick liquid (PAS 6).  Aspiration appeared to be secondary to delayed swallow initiation at the pyriform sinus and delayed laryngeal closure.  No laryngeal penetration or aspiration was observed with honey-thick liquid, puree, or regular solids.  Oral phase was remarkable for anterior spillage on the L side with thin liquid trials, reduced mastication of regular solids (at least partially contributable to edentulism), reduced lingual control resulting in premature spillage to the pharynx, and reduced lingual strength resulting in oral residue.  Pharyngeal phase was remarkable for reduced BOT retraction resulting in vallecular residue, reduced hyolaryngeal elevation/excursion resulting in vallecular and pyriform residue, and reduced epiglottic inversion with all liquid trials resulting in vallecular residue.  Pt was cued to take effortful dry swallows after trials in an attempt to reduce pharyngeal residue, but this was only partially successful.  Pharyngoesophageal phase was remarkable for suspected CP bar around the level of C5-C6 and suspected mild osteophytes at C5 and C6.  No esophageal backflow was observed.  Recommend initiation of Dysphagia 1 (puree) solids and honey-thick liquids (no straws) with medications administered crushed  in puree.  Pt will benefit from set up and intermittent supervision to cue for the following compensatory strategies: 1) Small bites/sips 2) Effortful dry swallow following each bite/sip 3) Slow  rate of intake 4) Sit upright as possible.   SLP Visit Diagnosis Dysphagia, oropharyngeal phase (R13.12) Attention and concentration deficit following -- Frontal lobe and executive function deficit following -- Impact on safety and function Moderate aspiration risk   CHL IP TREATMENT RECOMMENDATION 03/30/2020 Treatment Recommendations Therapy as outlined in treatment plan below   Prognosis 03/30/2020 Prognosis for Safe Diet Advancement Good Barriers to Reach Goals -- Barriers/Prognosis Comment -- CHL IP DIET RECOMMENDATION 03/30/2020 SLP Diet Recommendations Dysphagia 1 (Puree) solids;Honey thick liquids Liquid Administration via Cup Medication Administration Crushed with puree Compensations Slow rate;Small sips/bites;Effortful swallow;Multiple dry swallows after each bite/sip Postural Changes Seated upright at 90 degrees   CHL IP OTHER RECOMMENDATIONS 03/30/2020 Recommended Consults -- Oral Care Recommendations Oral care BID Other Recommendations Order thickener from pharmacy;Remove water pitcher;Have oral suction available   CHL IP FOLLOW UP RECOMMENDATIONS 03/30/2020 Follow up Recommendations Inpatient Rehab   CHL IP FREQUENCY AND DURATION 03/30/2020 Speech Therapy Frequency (ACUTE ONLY) min 2x/week Treatment Duration 2 weeks      CHL IP ORAL PHASE 03/30/2020 Oral Phase Impaired Oral - Pudding Teaspoon -- Oral - Pudding Cup -- Oral - Honey Teaspoon -- Oral - Honey Cup Delayed oral transit;Lingual/palatal residue Oral - Nectar Teaspoon -- Oral - Nectar Cup Delayed oral transit;Lingual/palatal residue;Premature spillage Oral - Nectar Straw Premature spillage;Delayed oral transit;Lingual/palatal residue Oral - Thin Teaspoon Left anterior bolus loss;Lingual/palatal residue;Delayed oral transit;Decreased bolus cohesion;Premature  spillage Oral - Thin Cup Left anterior bolus loss;Premature spillage;Delayed oral transit;Lingual/palatal residue Oral - Thin Straw Left anterior bolus loss;Premature spillage;Delayed oral transit;Lingual/palatal residue Oral - Puree Weak lingual manipulation;Lingual/palatal residue;Delayed oral transit Oral - Mech Soft -- Oral - Regular Impaired mastication;Weak lingual manipulation;Lingual/palatal residue;Delayed oral transit Oral - Multi-Consistency -- Oral - Pill -- Oral Phase - Comment --  CHL IP PHARYNGEAL PHASE 03/30/2020 Pharyngeal Phase Impaired Pharyngeal- Pudding Teaspoon -- Pharyngeal -- Pharyngeal- Pudding Cup -- Pharyngeal -- Pharyngeal- Honey Teaspoon -- Pharyngeal -- Pharyngeal- Honey Cup Delayed swallow initiation-vallecula;Pharyngeal residue - valleculae;Pharyngeal residue - pyriform;Pharyngeal residue - posterior pharnyx;Reduced pharyngeal peristalsis;Reduced laryngeal elevation;Reduced airway/laryngeal closure;Reduced epiglottic inversion;Reduced tongue base retraction Pharyngeal Material does not enter airway Pharyngeal- Nectar Teaspoon -- Pharyngeal -- Pharyngeal- Nectar Cup Delayed swallow initiation-vallecula;Penetration/Aspiration during swallow;Reduced anterior laryngeal mobility;Reduced laryngeal elevation;Reduced airway/laryngeal closure;Reduced tongue base retraction;Reduced epiglottic inversion;Pharyngeal residue - valleculae;Pharyngeal residue - pyriform Pharyngeal Material enters airway, remains ABOVE vocal cords and not ejected out Pharyngeal- Nectar Straw Delayed swallow initiation-pyriform sinuses;Pharyngeal residue - valleculae;Penetration/Aspiration during swallow;Reduced epiglottic inversion;Reduced anterior laryngeal mobility;Reduced laryngeal elevation;Reduced airway/laryngeal closure;Reduced tongue base retraction;Trace aspiration Pharyngeal Material enters airway, passes BELOW cords then ejected out Pharyngeal- Thin Teaspoon Delayed swallow initiation-pyriform  sinuses;Penetration/Aspiration before swallow;Penetration/Aspiration during swallow;Reduced epiglottic inversion;Reduced anterior laryngeal mobility;Reduced laryngeal elevation;Reduced airway/laryngeal closure;Reduced tongue base retraction;Trace aspiration;Pharyngeal residue - valleculae Pharyngeal Material enters airway, passes BELOW cords without attempt by patient to eject out (silent aspiration) Pharyngeal- Thin Cup Delayed swallow initiation-pyriform sinuses;Penetration/Aspiration before swallow;Penetration/Apiration after swallow;Reduced epiglottic inversion;Reduced anterior laryngeal mobility;Reduced laryngeal elevation;Reduced airway/laryngeal closure;Reduced tongue base retraction Pharyngeal Material enters airway, passes BELOW cords and not ejected out despite cough attempt by patient Pharyngeal- Thin Straw Delayed swallow initiation-pyriform sinuses;Penetration/Aspiration during swallow;Pharyngeal residue - valleculae;Reduced epiglottic inversion;Reduced anterior laryngeal mobility;Reduced laryngeal elevation;Reduced airway/laryngeal closure;Reduced tongue base retraction Pharyngeal Material enters airway, remains ABOVE vocal cords and not ejected out Pharyngeal- Puree Delayed swallow initiation-vallecula;Pharyngeal residue - valleculae;Pharyngeal residue - pyriform;Reduced anterior laryngeal mobility;Reduced tongue base retraction Pharyngeal Material does not enter airway Pharyngeal- Mechanical Soft -- Pharyngeal -- Pharyngeal- Regular Delayed swallow initiation-vallecula;Reduced anterior  laryngeal mobility;Pharyngeal residue - valleculae;Pharyngeal residue - pyriform;Pharyngeal residue - posterior pharnyx Pharyngeal Material does not enter airway Pharyngeal- Multi-consistency -- Pharyngeal -- Pharyngeal- Pill -- Pharyngeal -- Pharyngeal Comment --  CHL IP CERVICAL ESOPHAGEAL PHASE 03/30/2020 Cervical Esophageal Phase WFL Pudding Teaspoon -- Pudding Cup -- Honey Teaspoon -- Honey Cup -- Nectar Teaspoon  -- Nectar Cup -- Nectar Straw -- Thin Teaspoon -- Thin Cup -- Thin Straw -- Puree -- Mechanical Soft -- Regular -- Multi-consistency -- Pill -- Cervical Esophageal Comment -- Colin Mulders M.S., CCC-SLP Acute Rehabilitation Services Office: [phone] Elvia Collum [facility] 03/30/2020, 1:17 PM                Assessment/Plan: Diagnosis: 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 Does the need for close, 24 hr/day medical supervision in concert with the patient's rehab needs make it unreasonable for this patient to be served in a less intensive setting? Yes 2. 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 Due to bladder management, bowel management, safety, skin/wound care, disease management, medication administration, pain management and patient education, does the patient require 24 hr/day rehab nursing? Yes 4. Does the patient require coordinated care of a physician, rehab nurse, therapy disciplines of PT OT and speech to address physical and functional deficits in the context of the above medical diagnosis(es)? Yes Addressing deficits in the following areas: balance, endurance, locomotion, strength, transferring, bowel/bladder control, bathing, dressing, feeding, grooming, toileting, cognition, speech, swallowing and psychosocial support 5. Can the patient actively participate in an intensive therapy program of at least 3 hrs of therapy per day at least 5 days per week? Yes 6. The potential for patient to make measurable gains while on inpatient rehab is excellent 7. Anticipated functional outcomes upon discharge from inpatient rehab are modified independent  with PT, modified independent with OT, modified independent with SLP. 8. Estimated rehab length of stay to reach the above functional goals is: 7 to 10 days 9. Anticipated discharge destination:  Home 10. Overall Rehab/Functional Prognosis: excellent  RECOMMENDATIONS: This patient's condition is appropriate for continued rehabilitative care in the following setting: CIR Patient has agreed to participate in recommended program. Yes Note that insurance prior authorization may be required for reimbursement for recommended care.  Comment: We will need oncology follow-up as an outpatient, will also need MRI left shoulder suspect rotator cuff tear although at this point nonoperative care be needed given recent stroke   Bary Leriche, PA-C 03/31/2020  "I have personally performed a face to face diagnostic evaluation of this patient.  Additionally, I have reviewed and concur with the physician assistant's documentation above." Charlett Blake M.D. [facility] FAAPM&R (Neuromuscular Med) Diplomate Am Board of Electrodiagnostic Med Fellow Am Board of Interventional Pain

## 2020-03-31 NOTE — PMR Pre-admission (Signed)
PMR Admission Coordinator Pre-Admission Assessment  Patient: Ethan Fitzgerald is an 64 y.o., male MRN: [id number] DOB: 04-Jan-1956 Height: 6' (182.9 cm) Weight: 78.9 kg              Insurance Information HMO: yes    PPO:      PCP:      IPA:      80/20:      OTHER:  PRIMARY: [employer]      Policy#: [id number]      Subscriber: patient CM Name: Ethan Fitzgerald       Phone#: [phone]     Fax#: [phone] Pre-Cert#: [id number]      Employer: [employer] provided by Ethan Fitzgerald for admit to CIR on 04/01/20. Pt is approved with weekly updates due to [employer] (p); [phone] (f); [phone]  Benefits:  Phone #: online     Name: [email or web address] Eff. Date: 09/07/19-09/05/20     Deduct: does not have for in-network providers      Out of Pocket Max: $3,900 ($105.86 met)      Life Max: NA  CIR: $295/day co-pay for days 1-6, $0/day co-pay for days 7-90      SNF: $0/day co-pay for days 1-20, $184/day co-pay for days 21-100; limited to 100 days/cal yr Outpatient: $10-$40/visit co-pay pending service; visits limited by medical necessity    Home Health: 100% coverage, 0% co-insurance; limited by medical necessity      DME: 80% coverage, 20% co-insurance    Providers:  SECONDARY: None      Policy#:       Phone#:   Development worker, community:       Phone#:   The Actuary for patients in Inpatient Rehabilitation Facilities with attached Privacy Act [place] Records was provided and verbally reviewed with: Patient  Emergency Contact Information Contact Information    Name Relation Home Work Mobile   Ethan Fitzgerald,Ethan Fitzgerald [phone]       Current Medical History  Patient Admitting Diagnosis: 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 LH-male with history of COPD, recent bronchitis, HA who was admitted on 03/27/20 with left sided weakness and difficulty speaking. CT head showed  dense M1 segment with occlusive thrombus, occlusion of L-ICA age indeterminate with distal reconstitution at cavernous segment and good perfusion of L-MCA and B-ACAs, He underwent cerebral angio with partial revascularization of R-MCA dominant division with revascularization and balloon angioplasty of acutely occluded proximal R-ICA with reocclusion. Follow up MRI brain showed acute cortical/subcortical R-MCA infarct, additional small infarcts in right caudate nucleus, right pre and postcentral gyri and mid to anterior left frontal lobe, SAH and incidental noted of right parotid neoplasm. Follow up CT  Head showed unchanged acute R-MCA infarct with stable SAH.  2D echo technically difficult and showed EF 55-60%.   He tolerated extubation on 07/23 and dysphagia 1, honey liquids due to moderate oropharyngeal dysphagia. Dr. Leonie Man questioned that stroke due to dissection of large vessel due to coughing episodes v/s hypercoagulable state from parotid neoplasm--on ASA alone due to small SAH.  Pain left shoulder felt to be due to focal muscle strain from fall prior to stroke. Therapy evaluations completed revealing left sided left inattention, poor safety awareness with impulsivity affecting mobility and ADLs. CIR recommended. Pt is to be admitted to CIR on 04/01/20.   Complete NIHSS TOTAL: 4 Glasgow Coma Scale Score: 15  Past Medical History  Past  Medical History:  Diagnosis Date   COPD (chronic obstructive pulmonary disease) ([place])    mild   COVID-19 09/2019   Erectile dysfunction    Headache    daily, stress headaches   Knee pain, left    Seasonal allergies    Tobacco dependence     Family History  family history includes COPD in his brother; Cancer in his father; Diabetes in his mother; HIV/AIDS in his brother; Heart attack (age of onset: 66) in his mother.  Prior Rehab/Hospitalizations:  Has the patient had prior rehab or hospitalizations prior to admission? No  Has the patient had  major surgery during 100 days prior to admission? Yes  Current Medications   Current Facility-Administered Medications:    0.9 %  sodium chloride infusion, , Intravenous, Continuous, Rosalin Hawking, MD, Last Rate: 50 mL/hr at 03/30/20 2036, Rate Change at 03/30/20 2036   0.9 %  sodium chloride infusion, 250 mL, Intravenous, Continuous, Jacky Kindle, MD, Stopped at 03/27/20 1342   acetaminophen (TYLENOL) tablet 650 mg, 650 mg, Oral, Q4H PRN, 650 mg at 04/01/20 1019 **OR** acetaminophen (TYLENOL) 160 MG/5ML solution 650 mg, 650 mg, Per Tube, Q4H PRN **OR** acetaminophen (TYLENOL) suppository 650 mg, 650 mg, Rectal, Q4H PRN, Greta Doom, MD   aspirin chewable tablet 324 mg, 324 mg, Oral, Daily, 324 mg at 04/01/20 2458 **OR** aspirin suppository 300 mg, 300 mg, Rectal, Daily, Rosalin Hawking, MD   bisacodyl (DULCOLAX) suppository 10 mg, 10 mg, Rectal, Daily PRN, Rosalin Hawking, MD, 10 mg at 03/29/20 2124   chlorhexidine (PERIDEX) 0.12 % solution 15 mL, 15 mL, Mouth Rinse, BID, Leonie Man, Pramod S, MD, 15 mL at 04/01/20 1133   docusate sodium (COLACE) capsule 100 mg, 100 mg, Oral, BID, Pierce, Dwayne A, RPH, 100 mg at 04/01/20 0998   insulin aspart (novoLOG) injection 0-6 Units, 0-6 Units, Subcutaneous, Q4H, Christian, Rylee, MD   ipratropium-albuterol (DUONEB) 0.5-2.5 (3) MG/3ML nebulizer solution 3 mL, 3 mL, Nebulization, Q6H PRN, Leonie Man, Lucy Antigua, MD   labetalol (NORMODYNE) injection 5-10 mg, 5-10 mg, Intravenous, Q2H PRN **AND** [DISCONTINUED] nicardipine (CARDENE) 26m in 0.86% saline 2042mIV infusion (0.1 mg/ml), 0-15 mg/hr, Intravenous, Continuous PRN, KiGreta DoomMD   pantoprazole (PROTONIX) EC tablet 40 mg, 40 mg, Oral, QHS, XuRosalin HawkingMD, 40 mg at 03/31/20 2154   Resource ThickenUp Clear, , Oral, PRN, XuRosalin HawkingMD   Resource ThickenUp Clear, , Oral, PRN, XuRosalin HawkingMD   rosuvastatin (CRESTOR) tablet 20 mg, 20 mg, Oral, Daily, XuRosalin HawkingMD, 20 mg at 04/01/20  [id number] Current Diet:  Diet Order            DIET DYS 3 Room service appropriate? Yes; Fluid consistency: Honey Thick  Diet effective now                 Precautions / Restrictions Precautions Precautions: Fall Precaution Comments: impulsive; Lt inattention  Restrictions Weight Bearing Restrictions: No   Has the patient had 2 or more falls or a fall with injury in the past year?No  Prior Activity Level Community (5-7x/wk): [place] full time in [place]; Independent without an AD PTA.   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  Independent  Indoor Mobility: Did the patient need assistance with walking from  room to room (with or without device)? Independent  Stairs: Did the patient need assistance with internal or external stairs (with or without device)? Independent  Functional Cognition: Did the patient need help planning regular tasks such as shopping or remembering to take medications? Independent  Home Assistive Devices / Equipment Home Equipment: Shower seat  Prior Device Use: Indicate devices/aids used by the patient prior to current illness, exacerbation or injury? None of the above  Current Functional Level Cognition  Arousal/Alertness: Lethargic Overall Cognitive Status: Impaired/Different from baseline Current Attention Level: Sustained Orientation Level: Oriented X4 Following Commands: Follows one step commands consistently, Follows multi-step commands inconsistently Safety/Judgement: Decreased awareness of safety, Decreased awareness of deficits General Comments: forgets to check his left hand as he comes to stand with RW and begins walking without left hand on RW (required cues x3 transfers); ran into object on  left x 1 Attention: Focused, Sustained Focused Attention: Appears intact Sustained Attention: Appears intact Awareness: Appears intact Problem Solving: Appears intact Safety/Judgment: Appears intact    Extremity Assessment (includes Sensation/Coordination)  Upper Extremity Assessment: LUE deficits/detail LUE Deficits / Details: unable to complete shoulder movements due to pt. Pt able to complete 3/5 wrist flex/ext, sup/pro, and elbow flex/ext without pain.  LUE: Unable to fully assess due to pain LUE Sensation: decreased proprioception LUE Coordination: decreased gross motor, decreased fine motor  Lower Extremity Assessment: Defer to PT evaluation LLE Deficits / Details: AROM WFL strength grossly 4/5    ADLs  Overall ADL's : Needs assistance/impaired Eating/Feeding: Minimal assistance, Sitting Eating/Feeding Details (indicate cue type and reason): requires verbal cues for precautions, assist for set up, and assist for organization and problem solving  Grooming: Minimal assistance, Cueing for sequencing, Cueing for safety, Standing, Wash/dry hands Grooming Details (indicate cue type and reason): Min A with min guard for safety but mod multimodal cues for locating objects on L side when washing hands Upper Body Bathing: Moderate assistance, Sitting Lower Body Bathing: Minimal assistance, Sit to/from stand Upper Body Dressing : Moderate assistance, Sitting Lower Body Dressing: Moderate assistance, Sit to/from stand Toilet Transfer: Ambulation, RW, Minimal assistance, Cueing for sequencing Toilet Transfer Details (indicate cue type and reason): simulated to recliner with Min A for balance and verbal cues for seqeuncing Toileting- Clothing Manipulation and Hygiene: Moderate assistance, Sit to/from stand Toileting - Clothing Manipulation Details (indicate cue type and reason): Per nsg staff, pt has been incontinent of stool multiple times  Functional mobility during ADLs: Minimal  assistance, Rolling walker, Cueing for safety, Cueing for sequencing General ADL Comments: Pt with inattention to L side and needing verbal cues for safety and sequencing due to deficits on L side.     Mobility  Overal bed mobility: Needs Assistance Bed Mobility: Sit to Supine Rolling: Supervision Supine to sit: Min guard Sit to supine: Supervision General bed mobility comments: entered bed from right side, therefore left arm staying on bed not an issue today    Transfers  Overall transfer level: Needs assistance Equipment used: Rolling walker (2 wheeled) Transfers: Sit to/from Stand Sit to Stand: Min guard Stand pivot transfers: Min assist General transfer comment: from EOB and from chair with armrests; vc for sequencing with RW (tries to pull up on RW); x 3 neglected to put left hand on the walker grip before starting to walk    Ambulation / Gait / Stairs / Wheelchair Mobility  Ambulation/Gait Ambulation/Gait assistance: Herbalist (Feet): 100 Feet Assistive device: Rolling walker (2 wheeled) Gait Pattern/deviations: Step-through pattern,  Decreased stride length, Decreased weight shift to left, Decreased dorsiflexion - left, Drifts right/left, Trunk flexed General Gait Details: vc for upright posture and proximity to RW, when remains upright he does a better job of clearing left foot; excessively wide turn to his left x 1 (states he thought he had to go that far to avoid a person on his left (poorly judged distance and almost ran into wall on his right) Gait velocity: faster than is safe for his inattention to left Gait velocity interpretation: <1.8 ft/sec, indicate of risk for recurrent falls Stairs: Yes Stairs assistance: Min guard Stair Management: One rail Right, Step to pattern, Forwards Number of Stairs: 2 General stair comments: instructed to step up with left leg (weaker) for strengthening; pt also able to lower using left leg    Posture / Balance  Balance Overall balance assessment: Needs assistance Sitting-balance support: Feet supported, No upper extremity supported Sitting balance-Leahy Scale: Fair Standing balance support: During functional activity, Bilateral upper extremity supported Standing balance-Leahy Scale: Fair Standing balance comment: static standing no UE support; with weight-shifting and turning to look as changing his gowns +imbalance Standardized Balance Assessment Standardized Balance Assessment : Dynamic Gait Index Dynamic Gait Index Level Surface: Mild Impairment Change in Gait Speed: Mild Impairment Gait with Horizontal Head Turns: Mild Impairment Gait with Vertical Head Turns: Mild Impairment Gait and Pivot Turn: Mild Impairment Step Over Obstacle: Mild Impairment Step Around Obstacles: Mild Impairment Steps: Moderate Impairment Total Score: 15    Special needs/care consideration Skin: surgical incision   Designated visitor : Ethan Fitzgerald (from acute therapy documentation) Living Arrangements: Fitzgerald/significant other  Lives With: Fitzgerald Available Help at Discharge: Family Type of Home: House Home Layout: One level Home Access: Stairs to enter Technical brewer of Steps: 1 Bathroom Shower/Tub: Multimedia programmer: Handicapped height Additional Comments: Pt lives with Fitzgerald, who is supportive and who is retired   Nurse, learning disability for Discharge Living Setting: [employer], Lives with (comment) (wife) Type of Home at Discharge: House Discharge Home Layout: One level Discharge Home Access: Stairs to enter [place]: Can reach both Entrance Stairs-Number of Steps: 2 Discharge Bathroom Shower/Tub: Walk-in shower Discharge Bathroom Toilet: Handicapped height Discharge Bathroom Accessibility: Yes How Accessible: Accessible via walker Does the patient have any problems obtaining your medications?: Yes (Describe) (reports difficulty  affording inhaler)  Social/Family/Support Systems Patient Roles: Fitzgerald, Other (Comment) (full time employee ) Contact Information: wife: Ethan Fitzgerald ([phone]) daughter Ethan Fitzgerald): [phone] Anticipated Caregiver: Ethan Fitzgerald  Anticipated Caregiver's Contact Information: see above Ability/Limitations of Caregiver: Min A Caregiver Availability: 24/7 Discharge Plan Discussed with Primary Caregiver: Yes (pt and Ethan Fitzgerald) Is Caregiver In Agreement with Plan?: Yes Does Caregiver/Family have Issues with Lodging/Transportation while Pt is in Rehab?: No   Goals Patient/Family Goal for Rehab: PT/OT: Mod I/Supervision; SLP: Supervision Expected length of stay: 7-10 days Pt/Family Agrees to Admission and willing to participate: Yes Program Orientation Provided & Reviewed with Pt/Caregiver Including Roles  & Responsibilities: Yes  Barriers to Discharge: Home environment access/layout  Barriers to Discharge Comments: steps to enter home   Decrease burden of Care through IP rehab admission: NA  Possible need for SNF placement upon discharge:Not anticipated; pt has great social support from his wife at DC but anticipate he can reach Mod I level after a short CIR stay to allow him to return home safely.    Patient Condition: This patient's condition remains as documented in the consult dated 03/31/20, in which  the Rehabilitation Physician determined and documented that the patient's condition is appropriate for intensive rehabilitative care in an inpatient rehabilitation facility. Will admit to inpatient rehab today.   Preadmission Screen Completed By:  Raechel Ache, OT, 04/01/2020 4:39 PM ______________________________________________________________________   Discussed status with Dr. Dagoberto Ligas on 04/01/20 at 4:39PM and received approval for admission today.  Admission Coordinator:  Raechel Ache, time 4:39PM/Date 04/01/20

## 2020-03-31 NOTE — Discharge Instructions (Signed)
Femoral Site Care °This sheet gives you information about how to care for yourself after your procedure. Your health care provider may also give you more specific instructions. If you have problems or questions, contact your health care provider. °What can I expect after the procedure? °After the procedure, it is common to have: °· Bruising that usually fades within 1-2 weeks. °· Tenderness at the site. °Follow these instructions at home: °Wound care °1. Follow instructions from your health care provider about how to take care of your insertion site. Make sure you: °? Wash your hands with soap and water before you change your bandage (dressing). If soap and water are not available, use hand sanitizer. °? Change your dressing as directed- pressure dressing removed 24 hours post-procedure (and switch for bandaid), bandaid removed 72 hours post-procedure °2. Do not take baths, swim, or use a hot tub for 7 days post-procedure. °3. You may shower 48 hours after the procedure or as told by your health care provider. °? Gently wash the site with plain soap and water. °? Pat the area dry with a clean towel. °? Do not rub the site. This may cause bleeding. °4. Check your femoral site every day for signs of infection. Check for: °? Redness, swelling, or pain. °? Fluid or blood. °? Warmth. °? Pus or a bad smell. °Activity °· Do not stoop, bend, or lift anything that is heavier than 10 lb (4.5 kg) for 2 weeks post-procedure. °· Do not drive self for 2 weeks post-procedure. °Contact a health care provider if you have: °· A fever or chills. °· You have redness, swelling, or pain around your insertion site. °Get help right away if: °· The catheter insertion area swells very fast. °· You pass out. °· You suddenly start to sweat or your skin gets clammy. °· The catheter insertion area is bleeding, and the bleeding does not stop when you hold steady pressure on the area. °· The area near or just beyond the catheter insertion site  becomes pale, cool, tingly, or numb. °These symptoms may represent a serious problem that is an emergency. Do not wait to see if the symptoms will go away. Get medical help right away. Call your local emergency services (911 in the [place] Do not drive yourself to the hospital. ° °This information is not intended to replace advice given to you by your health care provider. Make sure you discuss any questions you have with your health care provider. °Document Revised: 09/05/2017 Document Reviewed: 09/05/2017 °Elsevier Patient Education © 2020 [employer] °

## 2020-03-31 NOTE — Progress Notes (Signed)
STROKE TEAM PROGRESS NOTE   INTERVAL HISTORY  PT is at bedside. Pt neuro stable, still complain of left shoulder pain due to fall prior to admission. No swelling or deformity seen. Will do X-ray on the left shoulder.    Vitals:   03/30/20 2320 03/31/20 0308 03/31/20 0748 03/31/20 1214  BP: (!) 145/88 (!) 142/88 (!) 151/96 (!) (P) 128/87  Pulse: 68 60 77   Resp: 18 19 18  (P) 18  Temp: 97.6 F (36.4 C) 98.2 F (36.8 C) 98.5 F (36.9 C) (P) 97.7 F (36.5 C)  TempSrc: Oral Oral Oral (P) Oral  SpO2: 97% 98% 97% (P) 96%  Weight:      Height:       CBC:  Recent Labs  Lab 03/27/20 0130 03/27/20 0143 03/28/20 0820 03/29/20 0641 03/30/20 0419 03/31/20 0325  WBC 9.0   < > 14.3*   < > 10.2 8.2  NEUTROABS 6.0  --  9.6*  --   --   --   HGB 13.2   < > 13.2   < > 12.9* 13.2  HCT 39.9   < > 40.8   < > 38.2* 38.1*  MCV 98.5   < > 101.0*   < > 96.2 94.3  PLT 245   < > 248   < > 245 239   < > = values in this interval not displayed.   Basic Metabolic Panel:  Recent Labs  Lab 03/30/20 0419 03/31/20 0325  NA 138 135  K 3.2* 3.6  CL 110 107  CO2 21* 21*  GLUCOSE 91 99  BUN 11 13  CREATININE 0.79 0.83  CALCIUM 8.6* 8.8*   Lipid Panel:  Recent Labs  Lab 03/27/20 0823  CHOL 153  TRIG 115  HDL 48  CHOLHDL 3.2  VLDL 23  LDLCALC 82   HgbA1c:  Recent Labs  Lab 03/27/20 0823  HGBA1C 5.7*   Urine Drug Screen:  Recent Labs  Lab 03/29/20 1302  LABOPIA NONE DETECTED  COCAINSCRNUR NONE DETECTED  LABBENZ NONE DETECTED  AMPHETMU NONE DETECTED  THCU NONE DETECTED  LABBARB NONE DETECTED    Alcohol Level  Recent Labs  Lab 03/27/20 0130  ETH <10    IMAGING past 24 hours No results found.  PHYSICAL EXAM    Temp:  [97.6 F (36.4 C)-98.5 F (36.9 C)] (P) 97.7 F (36.5 C) (07/26 1214) Pulse Rate:  [51-77] 77 (07/26 0748) Resp:  [18-20] (P) 18 (07/26 1214) BP: (142-151)/(88-96) (P) 128/87 (07/26 1214) SpO2:  [96 %-98 %] (P) 96 % (07/26 1214)  General - Well  nourished, well developed, in no apparent distress.  Ophthalmologic - fundi not visualized due to noncooperation.  Cardiovascular - Regular rhythm and rate.  Mental Status -  Level of arousal and orientation to time, place, and person were intact. Language including expression, naming, repetition, comprehension was assessed and found intact. Mild dysarthria Fund of Knowledge was assessed and was intact.  Cranial Nerves II - XII - II - Visual field intact OU. III, IV, VI - extraocular movement intact V - Facial sensation intact bilaterally. VII - left upper and lower facial weakness VIII - Hearing & vestibular intact bilaterally. X - Palate elevates symmetrically. XI - Chin turning & shoulder shrug intact bilaterally. XII - Tongue protrusion intact.  Motor Strength - The patient's strength was normal in right upper and lower extremities, however left UE 3/5 and LE 4+/5.  Bulk was normal and fasciculations were absent.   Motor  Tone - Muscle tone was assessed at the neck and appendages and was normal.  Reflexes - The patient's reflexes were symmetrical in all extremities and he had no pathological reflexes.  Sensory - Light touch, temperature/pinprick were assessed and were symmetrical.    Coordination - The patient had normal movements in the right hand with no ataxia or dysmetria.  Tremor was absent.  Gait and Station - deferred.   ASSESSMENT/PLAN Ethan Fitzgerald is a 64 y.o. male with history of COPD presenting with L sided weakness. Received tPA 03/27/2020 at 0151.  Stroke:   R MCA and multiple R and L anterior circulation infarcts s/p tPA and IR w/ partial revascularization, most likely secondary to large vessel disease with ? dissection due to coughing episodes, vs. Hypercoagulable state from parotid neoplasm   CT head dense R M1. Subtle asymmetric hypodensity R caudate and lentiform nucleus. ASPECTS 8    CTA head & neck R ICA LVO at bifurcation origin w/ cavernous  reconstitution and reocclusion proximal R M1. Good MCA collaterals. L ICA occlusion at bifurcation origin. Short severe L ICA cavernous stenosis.    Cerebral angio partial revascularization R MCA dominant division w/ TICI2a/TICI2b. S/p angioplasty acute R ICA occlusion w/ reocclusion     Post IR CT mild to mod RT peri-insular and post frontal convexity contrast stain vs SAH   MRI  R MCA frontal lobe infarct w/ possible mild petechial hemorrhage. B ICA siphon high-grade stenosis vs occlusion. R MCA SAH.  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 No source of embolus   LDL 82   HgbA1c 5.6  VTE prophylaxis - SCDs   No antithrombotic prior to admission, now on ASA alone due to small [facility]  Therapy recommendations:  CIR   Disposition:  Pending   Acute Hypoxemic Respiratory Failure Secondary to stroke  Intubated for IR, left intubated for airway protection following  Extubated 7/23  Tolerated well  Parotid mass  Incidental finding  Could be etiology of stroke  Tend to be slow growing  ENT referral at d/c w/ bx after rehab completed  Carotid stenosis   Chronic L ICA occlusion  Acute R ICA occlusion S/p R ICA angioplasty w/ reocclusion, ? Dissection from cough  Blood Pressure Hypotension . Asystole on IR table . BP stable . Off Neo . Long-term BP goal 130-150 . NS at 50 cc / hr  Hyperlipidemia  Home meds:  crestor 10 and fish oil  LDL 82, goal < 70  On crestor 20  Continue statin at discharge  Dysphagia . Secondary to stroke . Speech on board . Now cleared for dysphagia 3 with honey thick liquids . NS at 75->50cc / hr   Other Stroke Risk Factors  Advanced age  Former Cigarette smoker, quit 2 yrs ago  Former smokeless tobacco user  ETOH use, advised to drink no more than 2 drink(s) a day  Other Active Problems  COPD w/o acute exacerbation    Hypokalemia - 3.1->4.7->3.9->3.2->3.6  Leukocytosis, WBC 16.3->14.3 ->11.2->10.2->8.2   R Shoulder pain d/t soft tissue injury - X-ray pending   R parotid hyperintense lesion suspicious for primary neoplasm - outpt ENT f/u  Hospital day # 4   Rosalin Hawking, MD PhD Stroke Neurology 03/31/2020 1:11 PM   To contact Stroke Continuity provider, please refer to [email or web address] After hours, contact General Neurology

## 2020-03-31 NOTE — Progress Notes (Signed)
  Speech Language Pathology Treatment: Dysphagia  Patient Details Name: Ethan Fitzgerald MRN: [id number] DOB: November 13, 1955 Today's Date: 03/31/2020 Time: 1583-0940 SLP Time Calculation (min) (ACUTE ONLY): 24 min  Assessment / Plan / Recommendation Clinical Impression  Second session required due to wife arriving as SLP leaving and having questions.   Questions re pt's current diet, swallowing prognosis and potential for pt to dc home addressed to best of this clinicians ability.  SLP obtained soda for liquid consumption given pt with minimal intake (4 ounces juice at breakfast only).  Soda thickened to honey thick - informing wife of its use and pt seen with advanced trials (applesauce with graham crackers).   Reviewed with pt and his wife need to NOT talk when eating due to aspiration risk with his dysphagia.  Pt presents with mildly impaired mastication - however use of honey thick helpful to assist clearance with max verbal cues to utilize.  Pt with mild retention on the left lateral sulci but he cleared with mod verbal cue. Max cueing for pt to cease talking intiially - fading to min cues at end of session. Recommend to advance diet to dys3/honey thick with strict precautions.  Pt read swallow precaution signs out loud and verbalized reasoning for 2 compensation strategies with mod I, implementation will require more diagnostic/treatment.   Pt and wife benefited from education to importance to address pt on left side with mod I cues for wife to relocate in room (wife uncomfortable in chair - thus moved other to left side of bed) to maximize his attention.    Also addressed recommendation to turn off all noise when talking with pt to maximize comprehensiblity to listener.  Pt making progress and is an excellent candidate for CIR.  Both pt and wife educated to recommendations and plan and agreeable.     HPI HPI: 64 y.o. male with history of COPD presenting with L sided weakness.  R MCA infarct s/p tPA  and IR with partial revascularization. CT head 7/23: moderate-sized acute right MCA infarct centered in right frontal operculum; Hyperdensity within regional sulci, right sylvian fissure, and interpeduncular cistern may reflect a combination of subarachnoid hemorrhage and contrast.        SLP Plan  Continue with current plan of care       Recommendations  Diet recommendations: Dysphagia 3 (mechanical soft);Honey-thick liquid Liquids provided via: Cup;No straw Medication Administration: Whole meds with puree (crush if large) Supervision: Staff to assist with self feeding;Full supervision/cueing for compensatory strategies Compensations: Slow rate;Small sips/bites;Other (Comment);Follow solids with liquid ("cough and expectorate" at end of meal to clear pharynx) Postural Changes and/or Swallow Maneuvers: Seated upright 90 degrees;Upright 30-60 min after meal       Pt reports he is LEFT HANDED.          Oral Care Recommendations: Oral care BID;Oral care before and after PO (oral suction prn) Follow up Recommendations: Inpatient Rehab SLP Visit Diagnosis: Dysphagia, oropharyngeal phase (R13.12) Plan: Continue with current plan of care       Glen Ferris, Benen Weida Ann 03/31/2020, 11:36 AM   Kathleen Lime, MS [place] Office [phone]

## 2020-03-31 NOTE — Progress Notes (Signed)
Referring Physician(s): Code stroke- Greta Doom  Supervising Physician: Luanne Bras  Patient Status:  [facility] - [facility]-pt  Chief Complaint: Right shoulder pain  Subjective:  Acute CVA s/p cerebral arteriogram with emergent mechanical thrombectomy of right MCA inferior division occlusion achieving a TICI 2a/b revascularization along with revascularization of proximal right ICA acute occlusion using balloon angioplasty via right femoral approach 03/27/2020 by Dr. Estanislado Pandy. Patient awake and alert sitting in chair. Complains of right shoulder pain- able to move RUE but movements limited secondary to pain. Can spontaneously move all extremities. Mild dysarthria. Right groin puncture site c/d/i.   Allergies: Patient has no known allergies.  Medications: Prior to Admission medications   Medication Sig Start Date End Date Taking? Authorizing Provider  albuterol (VENTOLIN HFA) 108 (90 Base) MCG/ACT inhaler Inhale 1-2 puffs into the lungs every 4 (four) hours as needed for wheezing or shortness of breath. 11/05/19  Yes Karamalegos, Devonne Doughty, DO  Aspirin-Acetaminophen-Caffeine (EXCEDRIN PO) Take 2 tablets by mouth. am   Yes [provider]  guaiFENesin-codeine (ROBITUSSIN AC) 100-10 MG/5ML syrup Take 5-10 mLs by mouth 3 (three) times daily as needed for cough. 03/21/20  Yes Karamalegos, Devonne Doughty, DO  Omega-3 Fatty Acids (FISH OIL) 1000 MG CAPS Take 2,000 mg by mouth in the morning and at bedtime.   Yes [provider]  predniSONE (DELTASONE) 50 MG tablet Take 1 tablet (50 mg total) by mouth daily with breakfast. 03/21/20  Yes Karamalegos, Devonne Doughty, DO  rosuvastatin (CRESTOR) 10 MG tablet Take 2 tablets (20 mg total) by mouth daily. 12/27/19  Yes Karamalegos, Devonne Doughty, DO  Tiotropium Bromide-Olodaterol (STIOLTO RESPIMAT) 2.5-2.5 MCG/ACT AERS Inhale 2 puffs into the lungs daily. 12/27/19  Yes Lauraine Rinne, NP  vitamin C (ASCORBIC ACID) 500 MG tablet Take  500 mg by mouth daily. Am   Yes [provider]  azithromycin (ZITHROMAX Z-PAK) 250 MG tablet Take 2 tabs (500mg  total) on Day 1. Take 1 tab (250mg ) daily for next 4 days. Patient not taking: Reported on 03/27/2020 03/21/20   Olin Hauser, DO  cyclobenzaprine (FLEXERIL) 10 MG tablet Take 1 tablet (10 mg total) by mouth 3 (three) times daily as needed for muscle spasms. Patient not taking: Reported on 03/27/2020 05/08/19   Olin Hauser, DO  naproxen (NAPROSYN) 500 MG tablet TAKE 1 TABLET (500 MG TOTAL) BY MOUTH 2 (TWO) TIMES DAILY WITH A MEAL. FOR 2-4 WEEKS THEN AS NEEDED Patient not taking: Reported on 03/27/2020 03/30/19   Olin Hauser, DO  sildenafil (REVATIO) 20 MG tablet TAKE 1 TO 5 TABLETS BY MOUTH ABOUT 30 MINUTES PRIOR TO SEX.START WITH 1 THEN INCREASE Patient not taking: Reported on 03/27/2020 10/23/19   Olin Hauser, DO     Vital Signs: BP (!) 151/96 (BP Location: Left Arm)   Pulse 77   Temp 98.5 F (36.9 C) (Oral)   Resp 18   Ht 6' (1.829 m)   Wt 174 lb (78.9 kg)   SpO2 97%   BMI 23.60 kg/m   Physical Exam Vitals and nursing note reviewed.  Constitutional:      General: He is not in acute distress.    Appearance: Normal appearance.  Pulmonary:     Effort: Pulmonary effort is normal. No respiratory distress.  Skin:    General: Skin is warm and dry.     Comments: Right groin puncture site soft without active bleeding or hematoma.  Neurological:     Mental Status: He  is alert.     Comments: Alert, awake, and oriented x3. Speech mildly dysarthric, follows simple commands. PERRL bilaterally. Left facial droop. Tongue midline. Can spontaneously move all extremities, resistant to lift RUE above head secondary to pain.  Distal pulses (DPs) 1+ bilaterally.     Imaging: CT HEAD WO CONTRAST  Result Date: 03/28/2020 CLINICAL DATA:  Stroke follow-up. Right ICA and MCA occlusion status post partial endovascular  revascularization. EXAM: CT HEAD WITHOUT CONTRAST TECHNIQUE: Contiguous axial images were obtained from the base of the skull through the vertex without intravenous contrast. COMPARISON:  Head MRI 03/27/2020 FINDINGS: Brain: A moderate-sized acute right MCA infarct centered in the right frontal operculum is unchanged in size from yesterday's MRI. There is well-defined cytotoxic edema with mild regional mass effect but no significant midline shift. Hyperdensity within regional sulci, right sylvian fissure, and interpeduncular cistern may reflect a combination of subarachnoid hemorrhage and contrast, also similar in distribution to the prior MRI. There may be a small amount of parenchymal petechial hemorrhage, however no sizable parenchymal hematoma is evident. The ventricles are normal in size. There is no extra-axial fluid collection. Vascular: Calcified atherosclerosis at the skull base. Skull: No fracture or suspicious osseous lesion. Sinuses/Orbits: Mild bilateral ethmoid air cell mucosal thickening. Small volume bubbly secretions in the left sphenoid sinus. Clear mastoid air cells. Unremarkable orbits. Other: None. IMPRESSION: Unchanged size of acute right MCA infarct with similar distribution of associated subarachnoid hemorrhage and/or contrast. Electronically Signed   By: Logan Bores M.D.   On: 03/28/2020 06:37   MR BRAIN WO CONTRAST  Addendum Date: 03/27/2020   ADDENDUM REPORT: 03/27/2020 16:06 ADDENDUM: Findings of subarachnoid hemorrhage and right MCA acute infarct called by telephone at the time of interpretation on 03/27/2020 at 4:06 pm to provider Dr. Leonie Man, who verbally acknowledged these results. Electronically Signed   By: Kellie Simmering DO   On: 03/27/2020 16:06   Result Date: 03/27/2020 CLINICAL DATA:  Stroke, follow-up. EXAM: MRI HEAD WITHOUT CONTRAST TECHNIQUE: Multiplanar, multiecho pulse sequences of the brain and surrounding structures were obtained without intravenous contrast.  COMPARISON:  CT angiogram head/neck 03/27/2020, noncontrast head CT 03/27/2020 FINDINGS: Brain: There is cortical/subcortical restricted diffusion consistent with acute infarction within the right MCA vascular territory. Most notably, there is a confluent region of infarction within the posterolateral right frontal lobe, right frontal operculum, right insula and subtly involving portions of the superomedial right temporal lobe. This region of confluent infarction approximately 5.4 x 3.7 x 5.2 cm (AP x TV x CC). Additional small acute infarcts within the right caudate nucleus, right pre and postcentral gyri and mid to anterior left frontal lobe. Corresponding T2/FLAIR hyperintensity at these sites. There are small foci of T2* signal loss within the right sylvian fissure which may reflect subtle petechial hemorrhage and/or endoluminal thrombus (series 8, images 9-12). There is T2/FLAIR hyperintensity within the interpeduncular cistern, right MCA cistern, right sylvian fissure, and scattered along the right greater than left cerebral convexities likely reflecting subarachnoid hemorrhage. There is no evidence of hydrocephalus or midline shift. Background mild generalized parenchymal atrophy and mild cerebral white matter chronic small vessel ischemic disease. Vascular: Signal abnormality within the ICA siphons bilaterally suspicious for persistent occlusion or high-grade stenosis. T2 star signal loss within the right sylvian fissure suspicious for endoluminal thrombus within right M2 vessels. Skull and upper cervical spine: No focal marrow lesion. Sinuses/Orbits: Visualized orbits show no acute finding. Mild paranasal sinus mucosal thickening at the imaged levels, most notably ethmoidal. No significant mastoid  effusion. Other: Incidentally noted T2 hyperintense lesion within the medial right parotid gland measuring 11 mm. IMPRESSION: 5.4 x 3.7 x 5.2 cm cortical/subcortical acute right MCA infarct centered within the  posterolateral right frontal lobe, right frontal operculum, right insula and subtly involving portions of the superomedial right temporal lobe. Possible mild petechial hemorrhage. No significant mass effect at this time. Additional small acute infarcts within the right caudate nucleus, right pre and postcentral gyri and mid to anterior left frontal lobe. T2* signal loss within the right sylvian fissure suspicious for residual endoluminal thrombus. Additionally, there is persistent signal abnormality within the ICA siphons bilaterally likely reflecting persistent high-grade stenosis or occlusion. FLAIR hyperintensity within the interpeduncular cistern, right MCA cistern, right sylvian fissure and overlying the right greater than left cerebral hemispheres likely reflecting subarachnoid hemorrhage. No hydrocephalus. Background mild generalized parenchymal atrophy and chronic small vessel ischemic disease. Incidentally noted 11 mm T2 hyperintense lesion within the medial right parotid gland suspicious for primary parotid neoplasm. Consider ENT consultation when clinically appropriate. Electronically Signed: By: Kellie Simmering DO On: 03/27/2020 15:47   DG Swallowing Func-Speech Pathology  Result Date: 03/30/2020 Objective Swallowing Evaluation: Type of Study: MBS-Modified Barium Swallow Study  Patient Details Name: Ethan Fitzgerald MRN: [id number] Date of Birth: 14-Mar-1956 Today's Date: 03/30/2020 Time: SLP Start Time (ACUTE ONLY): 1145 -SLP Stop Time (ACUTE ONLY): 1210 SLP Time Calculation (min) (ACUTE ONLY): 25 min Past Medical History: Past Medical History: Diagnosis Date . COPD (chronic obstructive pulmonary disease) (HCC)   mild . Erectile dysfunction  . Headache   daily, stress headaches . Knee pain, left  . Seasonal allergies  . Tobacco dependence  Past Surgical History: Past Surgical History: Procedure Laterality Date . BACK SURGERY    metal plate in back . COLONOSCOPY   . COLONOSCOPY WITH PROPOFOL N/A 04/26/2019   Procedure: COLONOSCOPY WITH PROPOFOL;  Surgeon: Jonathon Bellows, MD;  Location: [facility];  Service: Gastroenterology;  Laterality: N/A; . HERNIA REPAIR Right  . KNEE ARTHROSCOPY Left 03/28/2015  Procedure: Arthroscopic partial medial meniscectomy plus chondral debridement;  Surgeon: Leanor Kail, MD;  Location: [place];  Service: Orthopedics;  Laterality: Left; . RADIOLOGY WITH ANESTHESIA N/A 03/27/2020  Procedure: IR WITH ANESTHESIA;  Surgeon: Luanne Bras, MD;  Location: [place];  Service: Radiology;  Laterality: N/A; HPI: 64 y.o. male with history of COPD presenting with L sided weakness.  R MCA infarct s/p tPA and IR with partial revascularization. CT head 7/23: moderate-sized acute right MCA infarct centered in right frontal operculum; Hyperdensity within regional sulci, right sylvian fissure, and interpeduncular cistern may reflect a combination of subarachnoid hemorrhage and contrast.   Subjective: Pt was alert and cooperative Assessment / Plan / Recommendation CHL IP CLINICAL IMPRESSIONS 03/30/2020 Clinical Impression Pt was seen for a modified barium swallow study and he presents with moderate oropharyngeal dysphagia with resultant silent aspiration of thin liquid (PAS 8) and sensed aspiration of nectar-thick liquid (PAS 6).  Aspiration appeared to be secondary to delayed swallow initiation at the pyriform sinus and delayed laryngeal closure.  No laryngeal penetration or aspiration was observed with honey-thick liquid, puree, or regular solids.  Oral phase was remarkable for anterior spillage on the L side with thin liquid trials, reduced mastication of regular solids (at least partially contributable to edentulism), reduced lingual control resulting in premature spillage to the pharynx, and reduced lingual strength resulting in oral residue.  Pharyngeal phase was remarkable for reduced BOT retraction resulting in vallecular residue, reduced hyolaryngeal elevation/excursion resulting  in  vallecular and pyriform residue, and reduced epiglottic inversion with all liquid trials resulting in vallecular residue.  Pt was cued to take effortful dry swallows after trials in an attempt to reduce pharyngeal residue, but this was only partially successful.  Pharyngoesophageal phase was remarkable for suspected CP bar around the level of C5-C6 and suspected mild osteophytes at C5 and C6.  No esophageal backflow was observed.  Recommend initiation of Dysphagia 1 (puree) solids and honey-thick liquids (no straws) with medications administered crushed in puree.  Pt will benefit from set up and intermittent supervision to cue for the following compensatory strategies: 1) Small bites/sips 2) Effortful dry swallow following each bite/sip 3) Slow rate of intake 4) Sit upright as possible.   SLP Visit Diagnosis Dysphagia, oropharyngeal phase (R13.12) Attention and concentration deficit following -- Frontal lobe and executive function deficit following -- Impact on safety and function Moderate aspiration risk   CHL IP TREATMENT RECOMMENDATION 03/30/2020 Treatment Recommendations Therapy as outlined in treatment plan below   Prognosis 03/30/2020 Prognosis for Safe Diet Advancement Good Barriers to Reach Goals -- Barriers/Prognosis Comment -- CHL IP DIET RECOMMENDATION 03/30/2020 SLP Diet Recommendations Dysphagia 1 (Puree) solids;Honey thick liquids Liquid Administration via Cup Medication Administration Crushed with puree Compensations Slow rate;Small sips/bites;Effortful swallow;Multiple dry swallows after each bite/sip Postural Changes Seated upright at 90 degrees   CHL IP OTHER RECOMMENDATIONS 03/30/2020 Recommended Consults -- Oral Care Recommendations Oral care BID Other Recommendations Order thickener from pharmacy;Remove water pitcher;Have oral suction available   CHL IP FOLLOW UP RECOMMENDATIONS 03/30/2020 Follow up Recommendations Inpatient Rehab   CHL IP FREQUENCY AND DURATION 03/30/2020 Speech Therapy Frequency  (ACUTE ONLY) min 2x/week Treatment Duration 2 weeks      CHL IP ORAL PHASE 03/30/2020 Oral Phase Impaired Oral - Pudding Teaspoon -- Oral - Pudding Cup -- Oral - Honey Teaspoon -- Oral - Honey Cup Delayed oral transit;Lingual/palatal residue Oral - Nectar Teaspoon -- Oral - Nectar Cup Delayed oral transit;Lingual/palatal residue;Premature spillage Oral - Nectar Straw Premature spillage;Delayed oral transit;Lingual/palatal residue Oral - Thin Teaspoon Left anterior bolus loss;Lingual/palatal residue;Delayed oral transit;Decreased bolus cohesion;Premature spillage Oral - Thin Cup Left anterior bolus loss;Premature spillage;Delayed oral transit;Lingual/palatal residue Oral - Thin Straw Left anterior bolus loss;Premature spillage;Delayed oral transit;Lingual/palatal residue Oral - Puree Weak lingual manipulation;Lingual/palatal residue;Delayed oral transit Oral - Mech Soft -- Oral - Regular Impaired mastication;Weak lingual manipulation;Lingual/palatal residue;Delayed oral transit Oral - Multi-Consistency -- Oral - Pill -- Oral Phase - Comment --  CHL IP PHARYNGEAL PHASE 03/30/2020 Pharyngeal Phase Impaired Pharyngeal- Pudding Teaspoon -- Pharyngeal -- Pharyngeal- Pudding Cup -- Pharyngeal -- Pharyngeal- Honey Teaspoon -- Pharyngeal -- Pharyngeal- Honey Cup Delayed swallow initiation-vallecula;Pharyngeal residue - valleculae;Pharyngeal residue - pyriform;Pharyngeal residue - posterior pharnyx;Reduced pharyngeal peristalsis;Reduced laryngeal elevation;Reduced airway/laryngeal closure;Reduced epiglottic inversion;Reduced tongue base retraction Pharyngeal Material does not enter airway Pharyngeal- Nectar Teaspoon -- Pharyngeal -- Pharyngeal- Nectar Cup Delayed swallow initiation-vallecula;Penetration/Aspiration during swallow;Reduced anterior laryngeal mobility;Reduced laryngeal elevation;Reduced airway/laryngeal closure;Reduced tongue base retraction;Reduced epiglottic inversion;Pharyngeal residue - valleculae;Pharyngeal  residue - pyriform Pharyngeal Material enters airway, remains ABOVE vocal cords and not ejected out Pharyngeal- Nectar Straw Delayed swallow initiation-pyriform sinuses;Pharyngeal residue - valleculae;Penetration/Aspiration during swallow;Reduced epiglottic inversion;Reduced anterior laryngeal mobility;Reduced laryngeal elevation;Reduced airway/laryngeal closure;Reduced tongue base retraction;Trace aspiration Pharyngeal Material enters airway, passes BELOW cords then ejected out Pharyngeal- Thin Teaspoon Delayed swallow initiation-pyriform sinuses;Penetration/Aspiration before swallow;Penetration/Aspiration during swallow;Reduced epiglottic inversion;Reduced anterior laryngeal mobility;Reduced laryngeal elevation;Reduced airway/laryngeal closure;Reduced tongue base retraction;Trace aspiration;Pharyngeal residue - valleculae Pharyngeal Material enters airway, passes BELOW cords without  attempt by patient to eject out (silent aspiration) Pharyngeal- Thin Cup Delayed swallow initiation-pyriform sinuses;Penetration/Aspiration before swallow;Penetration/Apiration after swallow;Reduced epiglottic inversion;Reduced anterior laryngeal mobility;Reduced laryngeal elevation;Reduced airway/laryngeal closure;Reduced tongue base retraction Pharyngeal Material enters airway, passes BELOW cords and not ejected out despite cough attempt by patient Pharyngeal- Thin Straw Delayed swallow initiation-pyriform sinuses;Penetration/Aspiration during swallow;Pharyngeal residue - valleculae;Reduced epiglottic inversion;Reduced anterior laryngeal mobility;Reduced laryngeal elevation;Reduced airway/laryngeal closure;Reduced tongue base retraction Pharyngeal Material enters airway, remains ABOVE vocal cords and not ejected out Pharyngeal- Puree Delayed swallow initiation-vallecula;Pharyngeal residue - valleculae;Pharyngeal residue - pyriform;Reduced anterior laryngeal mobility;Reduced tongue base retraction Pharyngeal Material does not enter  airway Pharyngeal- Mechanical Soft -- Pharyngeal -- Pharyngeal- Regular Delayed swallow initiation-vallecula;Reduced anterior laryngeal mobility;Pharyngeal residue - valleculae;Pharyngeal residue - pyriform;Pharyngeal residue - posterior pharnyx Pharyngeal Material does not enter airway Pharyngeal- Multi-consistency -- Pharyngeal -- Pharyngeal- Pill -- Pharyngeal -- Pharyngeal Comment --  CHL IP CERVICAL ESOPHAGEAL PHASE 03/30/2020 Cervical Esophageal Phase WFL Pudding Teaspoon -- Pudding Cup -- Honey Teaspoon -- Honey Cup -- Nectar Teaspoon -- Nectar Cup -- Nectar Straw -- Thin Teaspoon -- Thin Cup -- Thin Straw -- Puree -- Mechanical Soft -- Regular -- Multi-consistency -- Pill -- Cervical Esophageal Comment -- Colin Mulders M.S., CCC-SLP Acute Rehabilitation Services Office: [phone] [place] 03/30/2020, 1:17 PM              ECHOCARDIOGRAM COMPLETE  Result Date: 03/27/2020    ECHOCARDIOGRAM REPORT   Patient Name:   Ethan Fitzgerald Date of Exam: 03/27/2020 Medical Rec #:  [id number]      Height:       72.0 in Accession #:    [id number]     Weight:       174.0 lb Date of Birth:  04-17-1956      BSA:          2.008 m Patient Age:    89 years       BP:           121/96 mmHg Patient Gender: M              HR:           75 bpm. Exam Location:  Inpatient Procedure: 2D Echo Indications:    Stroke 434.91 / I163.9  History:        Patient has no prior history of Echocardiogram examinations.                 COPD; Risk Factors:Current Smoker. R MCA revascularization this                 morning.  Sonographer:    Darlina Sicilian RDCS Referring Phys: [phone] MCNEILL P KIRKPATRICK  Sonographer Comments: Suboptimal parasternal window, suboptimal apical window and echo performed with patient supine and on artificial respirator. IMPRESSIONS  1. Technically difficult study with limited views. Left ventricular ejection fraction, by estimation, is grossly 55 to 60%. The left ventricle has normal function. Left ventricular diastolic  parameters were normal.  2. Right ventricular systolic function is normal. The right ventricular size is normal. Tricuspid regurgitation signal is inadequate for assessing PA pressure.  3. The mitral valve is normal in structure. No evidence of mitral valve regurgitation.  4. The aortic valve was not well visualized. Aortic valve regurgitation is not visualized. No aortic stenosis is present. FINDINGS  Left Ventricle: Left ventricular ejection fraction, by estimation, is 55 to 60%. The left ventricle has normal function. The left ventricle has no regional wall motion abnormalities.  The left ventricular internal cavity size was normal in size. There is  no left ventricular hypertrophy. Left ventricular diastolic parameters were normal. Right Ventricle: The right ventricular size is normal. No increase in right ventricular wall thickness. Right ventricular systolic function is normal. Tricuspid regurgitation signal is inadequate for assessing PA pressure. Left Atrium: Left atrial size was normal in size. Right Atrium: Right atrial size was normal in size. Pericardium: There is no evidence of pericardial effusion. Mitral Valve: The mitral valve is normal in structure. No evidence of mitral valve regurgitation. Tricuspid Valve: The tricuspid valve is normal in structure. Tricuspid valve regurgitation is trivial. Aortic Valve: The aortic valve was not well visualized. Aortic valve regurgitation is not visualized. No aortic stenosis is present. Pulmonic Valve: The pulmonic valve was not well visualized. Pulmonic valve regurgitation is not visualized. Aorta: The aortic root is normal in size and structure. IAS/Shunts: The interatrial septum was not well visualized.  LEFT VENTRICLE PLAX 2D LVIDd:         4.40 cm  Diastology LVIDs:         3.50 cm  LV e' lateral:   8.70 cm/s LV PW:         0.70 cm  LV E/e' lateral: 6.5 LV IVS:        0.80 cm  LV e' medial:    8.70 cm/s LVOT diam:     2.30 cm  LV E/e' medial:  6.5 LV SV:          54 LV SV Index:   27 LVOT Area:     4.15 cm  LEFT ATRIUM             Index LA diam:        2.50 cm 1.24 cm/m LA Vol (A2C):   88.7 ml 44.17 ml/m LA Vol (A4C):   52.4 ml 26.09 ml/m LA Biplane Vol: 66.4 ml 33.07 ml/m  AORTIC VALVE LVOT Vmax:   52.70 cm/s LVOT Vmean:  35.600 cm/s LVOT VTI:    0.131 m  AORTA Ao Root diam: 3.30 cm MITRAL VALVE MV Area (PHT): 2.62 cm    SHUNTS MV Decel Time: 289 msec    Systemic VTI:  0.13 m MV E velocity: 56.60 cm/s  Systemic Diam: 2.30 cm MV A velocity: 39.80 cm/s MV E/A ratio:  1.42 Oswaldo Milian MD Electronically signed by Oswaldo Milian MD Signature Date/Time: 03/27/2020/2:43:13 PM    Final     Labs:  CBC: Recent Labs    03/28/20 0820 03/29/20 0641 03/30/20 0419 03/31/20 0325  WBC 14.3* 11.2* 10.2 8.2  HGB 13.2 13.0 12.9* 13.2  HCT 40.8 37.5* 38.2* 38.1*  PLT 248 231 245 239    COAGS: Recent Labs    03/27/20 0130  INR 1.0  APTT 24    BMP: Recent Labs    03/28/20 0820 03/29/20 0641 03/30/20 0419 03/31/20 0325  NA 141 140 138 135  K 3.9 3.4* 3.2* 3.6  CL 110 110 110 107  CO2 24 20* 21* 21*  GLUCOSE 94 101* 91 99  BUN 8 8 11 13   CALCIUM 8.8* 8.8* 8.6* 8.8*  CREATININE 1.03 0.79 0.79 0.83  GFRNONAA >60 >60 >60 >60  GFRAA >60 >60 >60 >60    LIVER FUNCTION TESTS: Recent Labs    10/29/19 0812 03/27/20 0130  BILITOT 1.0 0.8  AST 16 19  ALT 12 12  ALKPHOS  --  49  PROT 6.4 5.0*  ALBUMIN  --  2.9*  Assessment and Plan:  Acute CVA s/p cerebral arteriogram with emergent mechanical thrombectomy of right MCA inferior division occlusion achieving a TICI 2a/b revascularization along with revascularization of proximal right ICA acute occlusion using balloon angioplasty via right femoral approach 03/27/2020 by Dr. Estanislado Pandy. Patient's condition stable- mild dysarthria, moving all extremities (limited RUE movement secondary to pain only). Right groin puncture site stable, distal pulses (DPs) 1+ bilaterally. Will discuss  right shoulder pain with primary team. Plan to follow-up with Dr. Estanislado Pandy in clinic 1 month after discharge (order placed to facilitate this, our schedulers to call patient to set up this appointment). Further plans per neurology- appreciate and agree with management. Please call NIR with questions/concerns.   Electronically Signed: Earley Abide, PA-C 03/31/2020, 10:10 AM   I spent a total of 25 Minutes at the the patient's bedside AND on the patient's hospital floor or unit, greater than 50% of which was counseling/coordinating care for CVA s/p revascularization.

## 2020-03-31 NOTE — Discharge Summary (Signed)
Stroke Discharge Summary  Patient ID: javar eshbach   MRN: [id number]      DOB: [birth year]  Date of Admission: 03/27/2020 Date of Discharge: 04/01/2020  Attending Physician:  Rosalin Hawking, MD, Stroke MD Consultant(s):  Olene Craven) Estanislado Pandy, MD (Interventional Neuroradiologist), Jacky Kindle, MD ( pulmonary/intensive care ) Patient's PCP:  Olin Hauser, DO  Discharge Diagnoses:  Principal Problem:   Stroke (cerebrum) ([facility]) - mult B anterior infarcts s/p tPA + R MCA revascularizion. Active Problems:   Hyperlipidemia   Middle cerebral artery embolism, right   Parotid mass   Carotid stenosis   Hypotension   Dysphagia due to recent stroke   COPD without exacerbation (HCC)   Hypokalemia   Right shoulder pain   SAH (subarachnoid hemorrhage) (HCC) secondary to IR   Medications to be continued on Rehab Allergies as of 04/01/2020   No Known Allergies     Medication List    STOP taking these medications   azithromycin 250 MG tablet Commonly known as: Zithromax Z-Pak   cyclobenzaprine 10 MG tablet Commonly known as: FLEXERIL   EXCEDRIN PO   guaiFENesin-codeine 100-10 MG/5ML syrup Commonly known as: ROBITUSSIN AC   naproxen 500 MG tablet Commonly known as: NAPROSYN   predniSONE 50 MG tablet Commonly known as: DELTASONE   sildenafil 20 MG tablet Commonly known as: REVATIO     TAKE these medications   acetaminophen 325 MG tablet Commonly known as: TYLENOL Take 2 tablets (650 mg total) by mouth every 4 (four) hours as needed for mild pain (or temp > 37.5 C (99.5 F)).   albuterol 108 (90 Base) MCG/ACT inhaler Commonly known as: Ventolin HFA Inhale 1-2 puffs into the lungs every 4 (four) hours as needed for wheezing or shortness of breath.   aspirin 81 MG chewable tablet Chew 4 tablets (324 mg total) by mouth daily. Start taking on: April 02, 2020   chlorhexidine 0.12 % solution Commonly known as: PERIDEX 15 mLs by Mouth Rinse route 2 (two) times  daily.   Fish Oil 1000 MG Caps Take 2,000 mg by mouth in the morning and at bedtime.   insulin aspart 100 UNIT/ML injection Commonly known as: novoLOG Inject 0-6 Units into the skin every 4 (four) hours.   Resource ThickenUp Clear Powd Take 120 g by mouth as needed (honey thick liquids).   rosuvastatin 10 MG tablet Commonly known as: CRESTOR Take 2 tablets (20 mg total) by mouth daily.   sodium chloride 0.9 % infusion Inject 50 mLs into the vein continuous.   Stiolto Respimat 2.5-2.5 MCG/ACT Aers Generic drug: Tiotropium Bromide-Olodaterol Inhale 2 puffs into the lungs daily.   vitamin C 500 MG tablet Commonly known as: ASCORBIC ACID Take 500 mg by mouth daily. Am       LABORATORY STUDIES CBC    Component Value Date/Time   WBC 10.4 04/01/2020 0342   RBC 4.30 04/01/2020 0342   HGB 14.2 04/01/2020 0342   HCT 40.4 04/01/2020 0342   PLT 276 04/01/2020 0342   MCV 94.0 04/01/2020 0342   MCH 33.0 04/01/2020 0342   MCHC 35.1 04/01/2020 0342   RDW 12.4 04/01/2020 0342   LYMPHSABS 2.9 03/28/2020 0820   MONOABS 1.3 (H) 03/28/2020 0820   EOSABS 0.3 03/28/2020 0820   BASOSABS 0.1 03/28/2020 0820   CMP    Component Value Date/Time   NA 136 04/01/2020 0342   K 3.5 04/01/2020 0342   CL 106 04/01/2020 0342   CO2 22  04/01/2020 0342   GLUCOSE 94 04/01/2020 0342   BUN 13 04/01/2020 0342   CREATININE 0.95 04/01/2020 0342   CREATININE 0.99 10/29/2019 0812   CALCIUM 9.0 04/01/2020 0342   PROT 5.0 (L) 03/27/2020 0130   ALBUMIN 2.9 (L) 03/27/2020 0130   AST 19 03/27/2020 0130   ALT 12 03/27/2020 0130   ALKPHOS 49 03/27/2020 0130   BILITOT 0.8 03/27/2020 0130   GFRNONAA >60 04/01/2020 0342   GFRNONAA 80 10/29/2019 0812   GFRAA >60 04/01/2020 0342   GFRAA 92 10/29/2019 0812   COAGS Lab Results  Component Value Date   INR 1.0 03/27/2020   INR 0.97 08/15/2009   Lipid Panel    Component Value Date/Time   CHOL 153 03/27/2020 0823   TRIG 115 03/27/2020 0823   HDL 48  03/27/2020 0823   CHOLHDL 3.2 03/27/2020 0823   VLDL 23 03/27/2020 0823   LDLCALC 82 03/27/2020 0823   LDLCALC  10/29/2019 0812     Comment:     . LDL cholesterol not calculated. Triglyceride levels greater than 400 mg/dL invalidate calculated LDL results. . Reference range: <100 . Desirable range <100 mg/dL for primary prevention;   <70 mg/dL for patients with CHD or diabetic patients  with > or = 2 CHD risk factors. Marland Kitchen LDL-C is now calculated using the Martin-Hopkins  calculation, which is a validated novel method providing  better accuracy than the Friedewald equation in the  estimation of LDL-C.  Cresenciano Genre et al. Annamaria Helling. 1660;630(16): 2061-2068  (http://education.[employer].com/faq/FAQ164)    HgbA1C  Lab Results  Component Value Date   HGBA1C 5.7 (H) 03/27/2020   Urinalysis No results found for: COLORURINE, APPEARANCEUR, LABSPEC, PHURINE, GLUCOSEU, HGBUR, BILIRUBINUR, KETONESUR, PROTEINUR, UROBILINOGEN, NITRITE, LEUKOCYTESUR Urine Drug Screen     Component Value Date/Time   LABOPIA NONE DETECTED 03/29/2020 1302   COCAINSCRNUR NONE DETECTED 03/29/2020 1302   LABBENZ NONE DETECTED 03/29/2020 1302   AMPHETMU NONE DETECTED 03/29/2020 1302   THCU NONE DETECTED 03/29/2020 1302   LABBARB NONE DETECTED 03/29/2020 1302    Alcohol Level    Component Value Date/Time   ETH <10 03/27/2020 0130     SIGNIFICANT DIAGNOSTIC STUDIES CT Code Stroke CTA Head W/WO contrast  Result Date: 03/27/2020 CLINICAL DATA:  Initial evaluation for acute left-sided weakness. EXAM: CT ANGIOGRAPHY HEAD AND NECK TECHNIQUE: Multidetector CT imaging of the head and neck was performed using the standard protocol during bolus administration of intravenous contrast. Multiplanar CT image reconstructions and MIPs were obtained to evaluate the vascular anatomy. Carotid stenosis measurements (when applicable) are obtained utilizing NASCET criteria, using the distal internal carotid diameter as the  denominator. CONTRAST:  37mL OMNIPAQUE IOHEXOL 350 MG/ML SOLN COMPARISON:  Prior head CT from earlier same day. FINDINGS: CTA NECK FINDINGS Aortic arch: Visualized aortic arch of normal caliber with normal 3 vessel morphology. Atheromatous change at the origin of the left subclavian artery without flow-limiting stenosis. Right carotid system: Right common carotid artery patent from its origin to the bifurcation without stenosis. There is abrupt occlusion of the right ICA at its origin at the right carotid bifurcation. Right ICA remains occluded within the neck. Right external carotid artery and its branches remain patent and well perfused. Left carotid system: Left common carotid artery patent from its origin to the bifurcation without stenosis. Atheromatous change at the left bifurcation with associated abrupt occlusion of the left ICA at its origin. Left ICA remains occluded within the neck. Left external carotid artery and its branches remain  well perfused and patent. Vertebral arteries: Both vertebral arteries arise from the subclavian arteries. Right vertebral artery dominant. Vertebral arteries widely patent within the neck without stenosis, dissection, or occlusion. Skeleton: No definite acute osseous abnormality identified. No discrete lytic or blastic osseous lesions. Patient is edentulous. Other neck: No other acute soft tissue abnormality within the neck. No mass lesion or adenopathy. Upper chest: Visualized upper chest demonstrates no acute finding. Moderate emphysematous changes noted. Review of the MIP images confirms the above findings CTA HEAD FINDINGS Anterior circulation: Both internal carotid arteries occluded at the skull base. Distal reconstitution at the cavernous segments bilaterally likely via of collateral flow from the posterior circulation or possibly external carotid branches. Scattered atheromatous change within the carotid siphons bilaterally. No high-grade stenosis on the right. On the  left, there is a focal severe stenosis involving the cavernous left ICA (series 6, image 112). ICA termini patent bilaterally. A1 segments patent. Normal anterior communicating artery complex. Both anterior cerebral arteries remain patent and well perfused. Occlusive thrombus seen within the proximal left M1 segment, corresponding with hyperdense M1 seen on prior CT (series 6, image 98). Small anterior temporal branch appears to remain patent. Additional scattered flow seen within right MCA branches distally, likely collateral nature, overall moderately robust in appearance. Left M1 widely patent. Normal left MCA bifurcation. Distal left MCA branches well perfused and patent. Posterior circulation: Both vertebral arteries are widely patent to the vertebrobasilar junction. Neither PICA well visualized. Basilar mildly tortuous but widely patent to its distal aspect without stenosis. Superior cerebral arteries patent bilaterally. Both PCAs primarily supplied via the basilar and are well perfused to their distal aspects. Probable small bilateral posterior communicating arteries noted. Venous sinuses: Grossly patent allowing for timing the contrast bolus. Anatomic variants: Dominant right vertebral artery. No intracranial aneurysm. Review of the MIP images confirms the above findings IMPRESSION: 1. Positive CTA for LVO, with abrupt occlusion of the right ICA at its origin at the right bifurcation. Distal reconstitution at the cavernous segment with subsequent reocclusion at the proximal right M1 segment. Moderately robust collateralization evident within the right MCA territory. 2. Abrupt occlusion of the left ICA at its origin at the left bifurcation, age indeterminate. Distal reconstitution at the cavernous segment with good perfusion of the left MCA and both ACAs. Superimposed short-segment severe cavernous left ICA stenosis as above. 3. Wide patency of the posterior circulation. Right vertebral artery dominant. 4.   Emphysema (ICD10-J43.9). These results were communicated to Dr. Leonel Ramsay at [place] 7/22/2021by text page via the [facility] messaging system. Electronically Signed   By: Jeannine Boga M.D.   On: 03/27/2020 02:20   CT HEAD WO CONTRAST  Result Date: 03/28/2020 CLINICAL DATA:  Stroke follow-up. Right ICA and MCA occlusion status post partial endovascular revascularization. EXAM: CT HEAD WITHOUT CONTRAST TECHNIQUE: Contiguous axial images were obtained from the base of the skull through the vertex without intravenous contrast. COMPARISON:  Head MRI 03/27/2020 FINDINGS: Brain: A moderate-sized acute right MCA infarct centered in the right frontal operculum is unchanged in size from yesterday's MRI. There is well-defined cytotoxic edema with mild regional mass effect but no significant midline shift. Hyperdensity within regional sulci, right sylvian fissure, and interpeduncular cistern may reflect a combination of subarachnoid hemorrhage and contrast, also similar in distribution to the prior MRI. There may be a small amount of parenchymal petechial hemorrhage, however no sizable parenchymal hematoma is evident. The ventricles are normal in size. There is no extra-axial fluid collection. Vascular: Calcified atherosclerosis  at the skull base. Skull: No fracture or suspicious osseous lesion. Sinuses/Orbits: Mild bilateral ethmoid air cell mucosal thickening. Small volume bubbly secretions in the left sphenoid sinus. Clear mastoid air cells. Unremarkable orbits. Other: None. IMPRESSION: Unchanged size of acute right MCA infarct with similar distribution of associated subarachnoid hemorrhage and/or contrast. Electronically Signed   By: Logan Bores M.D.   On: 03/28/2020 06:37   CT Code Stroke CTA Neck W/WO contrast  Result Date: 03/27/2020 CLINICAL DATA:  Initial evaluation for acute left-sided weakness. EXAM: CT ANGIOGRAPHY HEAD AND NECK TECHNIQUE: Multidetector CT imaging of the head and neck was performed  using the standard protocol during bolus administration of intravenous contrast. Multiplanar CT image reconstructions and MIPs were obtained to evaluate the vascular anatomy. Carotid stenosis measurements (when applicable) are obtained utilizing NASCET criteria, using the distal internal carotid diameter as the denominator. CONTRAST:  59mL OMNIPAQUE IOHEXOL 350 MG/ML SOLN COMPARISON:  Prior head CT from earlier same day. FINDINGS: CTA NECK FINDINGS Aortic arch: Visualized aortic arch of normal caliber with normal 3 vessel morphology. Atheromatous change at the origin of the left subclavian artery without flow-limiting stenosis. Right carotid system: Right common carotid artery patent from its origin to the bifurcation without stenosis. There is abrupt occlusion of the right ICA at its origin at the right carotid bifurcation. Right ICA remains occluded within the neck. Right external carotid artery and its branches remain patent and well perfused. Left carotid system: Left common carotid artery patent from its origin to the bifurcation without stenosis. Atheromatous change at the left bifurcation with associated abrupt occlusion of the left ICA at its origin. Left ICA remains occluded within the neck. Left external carotid artery and its branches remain well perfused and patent. Vertebral arteries: Both vertebral arteries arise from the subclavian arteries. Right vertebral artery dominant. Vertebral arteries widely patent within the neck without stenosis, dissection, or occlusion. Skeleton: No definite acute osseous abnormality identified. No discrete lytic or blastic osseous lesions. Patient is edentulous. Other neck: No other acute soft tissue abnormality within the neck. No mass lesion or adenopathy. Upper chest: Visualized upper chest demonstrates no acute finding. Moderate emphysematous changes noted. Review of the MIP images confirms the above findings CTA HEAD FINDINGS Anterior circulation: Both internal  carotid arteries occluded at the skull base. Distal reconstitution at the cavernous segments bilaterally likely via of collateral flow from the posterior circulation or possibly external carotid branches. Scattered atheromatous change within the carotid siphons bilaterally. No high-grade stenosis on the right. On the left, there is a focal severe stenosis involving the cavernous left ICA (series 6, image 112). ICA termini patent bilaterally. A1 segments patent. Normal anterior communicating artery complex. Both anterior cerebral arteries remain patent and well perfused. Occlusive thrombus seen within the proximal left M1 segment, corresponding with hyperdense M1 seen on prior CT (series 6, image 98). Small anterior temporal branch appears to remain patent. Additional scattered flow seen within right MCA branches distally, likely collateral nature, overall moderately robust in appearance. Left M1 widely patent. Normal left MCA bifurcation. Distal left MCA branches well perfused and patent. Posterior circulation: Both vertebral arteries are widely patent to the vertebrobasilar junction. Neither PICA well visualized. Basilar mildly tortuous but widely patent to its distal aspect without stenosis. Superior cerebral arteries patent bilaterally. Both PCAs primarily supplied via the basilar and are well perfused to their distal aspects. Probable small bilateral posterior communicating arteries noted. Venous sinuses: Grossly patent allowing for timing the contrast bolus. Anatomic variants: Dominant right  vertebral artery. No intracranial aneurysm. Review of the MIP images confirms the above findings IMPRESSION: 1. Positive CTA for LVO, with abrupt occlusion of the right ICA at its origin at the right bifurcation. Distal reconstitution at the cavernous segment with subsequent reocclusion at the proximal right M1 segment. Moderately robust collateralization evident within the right MCA territory. 2. Abrupt occlusion of the  left ICA at its origin at the left bifurcation, age indeterminate. Distal reconstitution at the cavernous segment with good perfusion of the left MCA and both ACAs. Superimposed short-segment severe cavernous left ICA stenosis as above. 3. Wide patency of the posterior circulation. Right vertebral artery dominant. 4.  Emphysema (ICD10-J43.9). These results were communicated to Dr. Leonel Ramsay at [place] 7/22/2021by text page via the [facility] messaging system. Electronically Signed   By: Jeannine Boga M.D.   On: 03/27/2020 02:20   MR BRAIN WO CONTRAST  Addendum Date: 03/27/2020   ADDENDUM REPORT: 03/27/2020 16:06 ADDENDUM: Findings of subarachnoid hemorrhage and right MCA acute infarct called by telephone at the time of interpretation on 03/27/2020 at 4:06 pm to provider Dr. Leonie Man, who verbally acknowledged these results. Electronically Signed   By: Kellie Simmering DO   On: 03/27/2020 16:06   Result Date: 03/27/2020 CLINICAL DATA:  Stroke, follow-up. EXAM: MRI HEAD WITHOUT CONTRAST TECHNIQUE: Multiplanar, multiecho pulse sequences of the brain and surrounding structures were obtained without intravenous contrast. COMPARISON:  CT angiogram head/neck 03/27/2020, noncontrast head CT 03/27/2020 FINDINGS: Brain: There is cortical/subcortical restricted diffusion consistent with acute infarction within the right MCA vascular territory. Most notably, there is a confluent region of infarction within the posterolateral right frontal lobe, right frontal operculum, right insula and subtly involving portions of the superomedial right temporal lobe. This region of confluent infarction approximately 5.4 x 3.7 x 5.2 cm (AP x TV x CC). Additional small acute infarcts within the right caudate nucleus, right pre and postcentral gyri and mid to anterior left frontal lobe. Corresponding T2/FLAIR hyperintensity at these sites. There are small foci of T2* signal loss within the right sylvian fissure which may reflect subtle  petechial hemorrhage and/or endoluminal thrombus (series 8, images 9-12). There is T2/FLAIR hyperintensity within the interpeduncular cistern, right MCA cistern, right sylvian fissure, and scattered along the right greater than left cerebral convexities likely reflecting subarachnoid hemorrhage. There is no evidence of hydrocephalus or midline shift. Background mild generalized parenchymal atrophy and mild cerebral white matter chronic small vessel ischemic disease. Vascular: Signal abnormality within the ICA siphons bilaterally suspicious for persistent occlusion or high-grade stenosis. T2 star signal loss within the right sylvian fissure suspicious for endoluminal thrombus within right M2 vessels. Skull and upper cervical spine: No focal marrow lesion. Sinuses/Orbits: Visualized orbits show no acute finding. Mild paranasal sinus mucosal thickening at the imaged levels, most notably ethmoidal. No significant mastoid effusion. Other: Incidentally noted T2 hyperintense lesion within the medial right parotid gland measuring 11 mm. IMPRESSION: 5.4 x 3.7 x 5.2 cm cortical/subcortical acute right MCA infarct centered within the posterolateral right frontal lobe, right frontal operculum, right insula and subtly involving portions of the superomedial right temporal lobe. Possible mild petechial hemorrhage. No significant mass effect at this time. Additional small acute infarcts within the right caudate nucleus, right pre and postcentral gyri and mid to anterior left frontal lobe. T2* signal loss within the right sylvian fissure suspicious for residual endoluminal thrombus. Additionally, there is persistent signal abnormality within the ICA siphons bilaterally likely reflecting persistent high-grade stenosis or occlusion. FLAIR hyperintensity within the  interpeduncular cistern, right MCA cistern, right sylvian fissure and overlying the right greater than left cerebral hemispheres likely reflecting subarachnoid hemorrhage.  No hydrocephalus. Background mild generalized parenchymal atrophy and chronic small vessel ischemic disease. Incidentally noted 11 mm T2 hyperintense lesion within the medial right parotid gland suspicious for primary parotid neoplasm. Consider ENT consultation when clinically appropriate. Electronically Signed: By: Kellie Simmering DO On: 03/27/2020 15:47   IR CT Head Ltd  Result Date: 03/31/2020 INDICATION: New onset left-sided weakness and left-sided neglect. 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 Also bilaterally occluded internal carotid arteries proximally. EXAM: 1. EMERGENT LARGE VESSEL OCCLUSION THROMBOLYSIS (anterior CIRCULATION) COMPARISON:  CT angiogram of the head and neck of March 27, 2020. MEDICATIONS: Ancef 2 g IV antibiotic was administered within 1 hour of the procedure. ANESTHESIA/SEDATION: General anesthesia CONTRAST:  Isovue 300 approximately 200 mL FLUOROSCOPY TIME:  Fluoroscopy Time: 120 minutes 6 seconds (3695 mGy). COMPLICATIONS: None immediate. TECHNIQUE: Following a full explanation of the procedure along with the potential associated complications, an informed witnessed consent was obtained. The risks of intracranial hemorrhage of 10%, worsening neurological deficit, ventilator dependency, death and inability to revascularize were all reviewed in detail with the patient's spouse. The patient was then put under general anesthesia by the Department of Anesthesiology at [facility]. The right groin was prepped and draped in the usual sterile fashion. Thereafter using modified Seldinger technique, transfemoral access into the right common femoral artery was obtained without difficulty. Over a 0.035 inch guidewire a 8 French 25 cm Pinnacle sheath was inserted. Through this, and also over a 0.035 inch guidewire a combination a 5 [place] Bernstein support catheter inside of 087 balloon guide  catheter was advanced to the aortic arch region and selectively positioned in the right common carotid artery. The guidewire and the support catheter were retrieved and removed. Good aspiration obtained from the hub of the balloon guide catheter. Gentle control arteriogram through this demonstrated no evidence of dissection or of intraluminal filling defects. A control arteriogram was then performed centered extra cranially and intracranially. FINDINGS: The right common carotid arteriogram demonstrates the right external carotid artery and its major branches to be widely patent. The right internal carotid artery at the bulb has a string sign opacification at the bulb. There is no distal string sign noted extending to the skull base or intracranially. However, there is reconstitution of the right internal carotid artery in the cavernous segment with antegrade flow into the supraclinoid segment and the right anterior cerebral artery into the capillary and venous phases. Poor flow seen in the right middle cerebral artery proximally, with faint opacification of the superior division. PROCEDURE: Through the balloon guide catheter in the origin of the left internal carotid bulb, an 021 Trevo ProVue microcatheter was advanced without difficulty over a 0.014 inch standard Synchro micro guidewire through the occluded internal carotid artery to the petrous cavernous segment. The guidewire was removed. Good aspiration obtained from the hub of the microcatheter. A gentle control arteriogram performed through this demonstrated antegrade flow into the supraclinoid right ICA and the right anterior cerebral, and faintly right middle cerebral artery distribution. Microcatheter was then exchanged for a 300 cm 014 inch Synchro standard exchange micro guidewire. The microcatheter was removed. A control arteriogram performed through the balloon catheter in the right common carotid artery demonstrated no significant flow through the  occluded right internal carotid artery. At this time, a 4  mm x 30 mm Viatrac 014 inch balloon was prepped and draped in the usual sterile manner. Using the rapid exchange technique, the balloon was advanced to the right internal carotid artery bulb region. The distal and the proximal markers were aligned appropriately. Thereafter a slow inflation was started with the micro flexion syringe demonstrated almost reocclusion of the previously angioplastied right internal carotid proximally. Almost immediately, there was significant decrease in the patient's heart rate to asystole which lasted for about 5 seconds and responded promptly to deflation. Following treatment for prevention of bradycardia, a repeat angioplasty was then performed to approximately 9 atmospheres for approximately 60 seconds. The balloon was deflated and retrieved and removed. A control arteriogram performed through the balloon guide catheter in the right common carotid artery demonstrated improved flow through the angioplastied segment of the right internal carotid artery. Over the wire, a 071 Zoom catheter was advanced coaxially with an 021 Trevo ProVue microcatheter over the exchange micro guidewire. Once in the petrous segment the micro guidewire was removed. Good aspiration obtained from the hub of the microcatheter. An 014 inch standard Synchro micro guidewire was then introduced and advanced to the distal end of the microcatheter. Using a torque device, the combination of the Zoom intermediary catheter, with the microcatheter was advanced into the origin of the right middle cerebral artery. The micro guidewire was then gently advanced more distally with minimal resistance to the M2 M3 region of the inferior division. The micro guidewire was removed. Good aspiration obtained from the hub of the microcatheter. The 071 Zoom catheter was now advanced into the mid M1 segment. A 4 mm x 40 mm Solitaire X retrieval device was then advanced to the  distal end of the microcatheter and deployed in the usual manner without difficulty. At this time the 071 Zoom catheter was engaged into mid of the clot. Constant aspiration was maintained at the hub of the Zoom catheter for approximately 2-1/2 minutes in the locked position. Following this, the microcatheter with retrieval device, and the Zoom catheter were retrieved and removed. There were chunks of clot noted entangled in the interstices of the retrieval device. A control arteriogram performed through the balloon guide demonstrated near complete reocclusion of the previously angioplastied right ICA. A second pass was then made again using the above combination with the combination of an 021 Trevo ProVue microcatheter inside of a 071 Zoom intermediary catheter over a 0.014 inch standard Synchro micro guidewire. The combination was navigated through occluded right internal carotid artery proximally without difficulty. Again the inferior division of the right middle cerebral was then entered with the micro guidewire to the M2 M3 region followed by the microcatheter. The guidewire was removed with good aspiration obtained from the hub of the microcatheter. A gentle control arteriogram performed through this demonstrates safe position tip of the microcatheter. This was then connected to continuous heparinized saline infusion. A 4 mm x 40 mm Solitaire X retrieval device was then again deployed the usual manner. The Zoom catheter was then again engaged into the clot with complete occlusion to Penumbra aspiration pump device for 2-1/2 minutes. Combination of the retrieval device, the intermediary catheter, and the microcatheter was retrieved and removed. Again no significant clot was noted in the aspirate or in the retrieval device. A control arteriogram performed through the balloon guide in the right common carotid artery continued to demonstrate now complete occlusion of the right internal carotid artery. This  prompted another angioplasty again using the 4 mm x 30  mm Viatrac 14 balloon following the placement of an exchange micro guidewire as described earlier in the petrous segment of the right internal carotid artery. Control angioplasty was then performed to approximately 7.3 atmospheres where it was maintained again for approximately 60 seconds. Upon deflation and proximal retrieval improved flow was noted in the right internal carotid artery. A third pass was then made again using the combination of the 027 microcatheter and a 0.014 inch standard Synchro micro guidewire again in the inferior division. Again following appropriate deployment, with aspiration with proximal flow arrest was again deployed for 2-1/2 minutes. Following removal of the retrieval device, the microcatheter, and also the Zoom intermediary catheter, a control arteriogram performed in the right common carotid artery demonstrated improved visualization of the right middle cerebral artery superior division, the anterior temporal region, and also the inferior division more proximally. A fourth pass was then made this time deploying the same instrumentation but without deployment of the retrieval device. Contact aspiration was maintained for approximately 2-1/2 minutes with the Zoom aspiration catheter. No significant aspiration was noticeable using the pump device for 2-1/2 minutes. This was then retrieved and removed. Again no evidence of clot was seen within the aspiration. Control arteriogram performed through the balloon guide in the right internal carotid artery now demonstrated further near complete occlusion of the right internal carotid artery. A fifth attempt was made again using the above combination with this time deployment of a 3 mm x 20 mm Solitaire X retrieval device in the inferior division M2 region. With contact aspiration with the Zoom catheter engaged in the clot with constant aspiration being applied with a Penumbra pump device  four 2-1/2 minutes, the combination of the retrieval device, the microcatheter and the aspiration catheter was retrieved and removed. A control arteriogram performed with balloon guide in the right internal carotid artery origin demonstrated improvement of the inferior division with now waist throughout the presence of superior division, the anterior temporal branch and also a branch of the inferior division distally. The anterior cerebral artery remained patent. More proximally there was near complete occlusion of the right internal carotid artery which had been previously angioplastied. A fifth angioplasty was then performed using the maneuver described earlier. At this time the balloon was inflated to 9.5 atmospheres in a controlled manner and maintained for approximately a minute and a half. Following the deflation and removal of the angioplasty balloon and a control arteriogram performed through the balloon guide demonstrated modestly improved caliber and flow through the internal carotid artery proximally. In anticipation of placement of a stent in the right internal carotid artery, a CT scan of the brain was performed which demonstrated mild-to-moderate hyper attenuation in the peri insular, cortical and posterior frontal cortical regions. This suggested the presence of contrast stain with possible associated subarachnoid hemorrhage. No mass effect or midline shift was noted. Diagnostic arteriograms with a 5 [place] JB 1 catheter was then performed of the right vertebral artery, the left common carotid artery and the left vertebral artery. The right vertebral origin demonstrates wide patency. The vessel was seen to opacify to the cranial skull base. Patency of the right posterior-inferior cerebellar artery, the vertebrobasilar junction, the basilar artery, the posterior cerebral arteries, the superior cerebellar arteries and the anterior inferior cerebellar arteries was maintained. There was also prompt  retrograde opacification via the right posterior communicating artery, the right anterior cerebral artery distribution, and also the right posterior cerebral artery P3 branches. These delayed arterial phases demonstrated significant retrograde opacification  of the right middle cerebral artery distribution in the cortical and subcortical regions aided by collaterals from the pericallosal branches of the right anterior cerebral artery opacifying the right middle cerebral artery distribution. The left common carotid arteriogram again demonstrates prominence of the right external carotid artery and its branches with retrograde opacification of the caval segment of the left internal carotid artery via collaterals from the lacrimal branches of the external carotid artery retrograde filling the internal carotid artery via the ophthalmic artery. Retrograde opacification of the internal carotid artery to the petrous cavernous junction is also noted without antegrade clearance of contrast. Opacification of the left middle cerebral artery and the left anterior cerebral artery into the capillary and venous phases is present. The left vertebral artery origin demonstrates wide patency. The vessel opacifies to the cranial skull base with wide patency of the left vertebrobasilar junction and the left posterior-inferior cerebellar artery. Opacification of the basilar artery, the posterior cerebral arteries, the superior cerebellar arteries is again noted to be present. Unopacified blood is seen in the basilar artery from the contralateral more dominant vertebral artery. Again demonstrated is retrograde opacification via the posterior communicating arteries of the anterior cerebral artery distribution bilaterally. Previously mentioned collaterals in the anterior cerebral artery and P3 segments of the posterior cerebral artery are seen on the right side. In view of the extensive collaterals, and the hemo dynamic instability induced by  balloon angioplasty of the right internal carotid proximally and significant recoil with multiple balloon angioplasties, further treatment with stent placement in the right internal carotid artery and exposure to dual antiplatelets, and intravenous cangrelor would predispose to intracranial hemorrhage with neurological worsening, and also a chance of reocclusion of the stent. The procedure was therefore terminated. The balloon guide and the 8 French Pinnacle sheath in the right groin were removed with successful placement of an 8 French Angio-Seal closure device for hemostasis. Distal pulses remained Dopplerable bilaterally unchanged. Patient was left intubated because of his neurologic condition, and to protect his airway. He was then transferred to the neuro ICU for post revascularization management. IMPRESSION: Status post endovascular revascularization of occluded right middle cerebral artery proximally, and of the right insular inferior division, with 3 passes with a Solitaire 4 mm x 40 mm retrieval device, 1 pass with the Solitaire 3 mm x 20 mm retrieval device x1 with constant aspiration device achieving a TICI 2a/TICI 2b revascularization. Extensive robust collaterals from both PCAs in the P3 segments, and the posterior communicating artery retrogradely reconstituting the right middle cerebral artery distribution. PLAN: Follow-up in the clinic 4 weeks post discharge. Electronically Signed   By: Luanne Bras M.D.   On: 03/28/2020 11:25   Portable Chest x-ray  Result Date: 03/27/2020 CLINICAL DATA:  Intubation. EXAM: PORTABLE CHEST 1 VIEW COMPARISON:  12/04/2019. FINDINGS: Endotracheal tube noted with its tip 5 cm above the carina. NG tube noted with its tip coiled stomach. Heart size normal. Low lung volumes with bibasilar. Right costophrenic angle incompletely imaged. No pleural effusion noted. No pneumothorax. IMPRESSION: 1. Endotracheal tube noted with tip 5 cm above the carina. NG tube noted  with tip coiled stomach. 2.  Low lung volumes with bibasilar atelectasis. Electronically Signed   By: Marcello Moores  Register   On: 03/27/2020 07:20   DG Shoulder Left Port  Result Date: 03/31/2020 CLINICAL DATA:  Shoulder pain after fall EXAM: LEFT SHOULDER COMPARISON:  None. FINDINGS: There is no evidence of fracture or dislocation. There is no evidence of arthropathy or other focal  bone abnormality. Soft tissues are unremarkable. IMPRESSION: Negative. Electronically Signed   By: Donavan Foil M.D.   On: 03/31/2020 19:42   DG Swallowing Func-Speech Pathology  Result Date: 03/30/2020 Objective Swallowing Evaluation: Type of Study: MBS-Modified Barium Swallow Study  Patient Details Name: ATSUSHI YOM MRN: [id number] Date of Birth: 1956/05/18 Today's Date: 03/30/2020 Time: SLP Start Time (ACUTE ONLY): 1145 -SLP Stop Time (ACUTE ONLY): 1210 SLP Time Calculation (min) (ACUTE ONLY): 25 min Past Medical History: Past Medical History: Diagnosis Date . COPD (chronic obstructive pulmonary disease) (HCC)   mild . Erectile dysfunction  . Headache   daily, stress headaches . Knee pain, left  . Seasonal allergies  . Tobacco dependence  Past Surgical History: Past Surgical History: Procedure Laterality Date . BACK SURGERY    metal plate in back . COLONOSCOPY   . COLONOSCOPY WITH PROPOFOL N/A 04/26/2019  Procedure: COLONOSCOPY WITH PROPOFOL;  Surgeon: Jonathon Bellows, MD;  Location: [facility] ENDOSCOPY;  Service: Gastroenterology;  Laterality: N/A; . HERNIA REPAIR Right  . KNEE ARTHROSCOPY Left 03/28/2015  Procedure: Arthroscopic partial medial meniscectomy plus chondral debridement;  Surgeon: Leanor Kail, MD;  Location: [place];  Service: Orthopedics;  Laterality: Left; . RADIOLOGY WITH ANESTHESIA N/A 03/27/2020  Procedure: IR WITH ANESTHESIA;  Surgeon: Luanne Bras, MD;  Location: [facility];  Service: Radiology;  Laterality: N/A; HPI: 64 y.o. male with history of COPD presenting with L sided weakness.  R MCA infarct s/p  tPA and IR with partial revascularization. CT head 7/23: moderate-sized acute right MCA infarct centered in right frontal operculum; Hyperdensity within regional sulci, right sylvian fissure, and interpeduncular cistern may reflect a combination of subarachnoid hemorrhage and contrast.   Subjective: Pt was alert and cooperative Assessment / Plan / Recommendation CHL IP CLINICAL IMPRESSIONS 03/30/2020 Clinical Impression Pt was seen for a modified barium swallow study and he presents with moderate oropharyngeal dysphagia with resultant silent aspiration of thin liquid (PAS 8) and sensed aspiration of nectar-thick liquid (PAS 6).  Aspiration appeared to be secondary to delayed swallow initiation at the pyriform sinus and delayed laryngeal closure.  No laryngeal penetration or aspiration was observed with honey-thick liquid, puree, or regular solids.  Oral phase was remarkable for anterior spillage on the L side with thin liquid trials, reduced mastication of regular solids (at least partially contributable to edentulism), reduced lingual control resulting in premature spillage to the pharynx, and reduced lingual strength resulting in oral residue.  Pharyngeal phase was remarkable for reduced BOT retraction resulting in vallecular residue, reduced hyolaryngeal elevation/excursion resulting in vallecular and pyriform residue, and reduced epiglottic inversion with all liquid trials resulting in vallecular residue.  Pt was cued to take effortful dry swallows after trials in an attempt to reduce pharyngeal residue, but this was only partially successful.  Pharyngoesophageal phase was remarkable for suspected CP bar around the level of C5-C6 and suspected mild osteophytes at C5 and C6.  No esophageal backflow was observed.  Recommend initiation of Dysphagia 1 (puree) solids and honey-thick liquids (no straws) with medications administered crushed in puree.  Pt will benefit from set up and intermittent supervision to cue for  the following compensatory strategies: 1) Small bites/sips 2) Effortful dry swallow following each bite/sip 3) Slow rate of intake 4) Sit upright as possible.   SLP Visit Diagnosis Dysphagia, oropharyngeal phase (R13.12) Attention and concentration deficit following -- Frontal lobe and executive function deficit following -- Impact on safety and function Moderate aspiration risk   CHL IP TREATMENT RECOMMENDATION 03/30/2020  Treatment Recommendations Therapy as outlined in treatment plan below   Prognosis 03/30/2020 Prognosis for Safe Diet Advancement Good Barriers to Reach Goals -- Barriers/Prognosis Comment -- CHL IP DIET RECOMMENDATION 03/30/2020 SLP Diet Recommendations Dysphagia 1 (Puree) solids;Honey thick liquids Liquid Administration via Cup Medication Administration Crushed with puree Compensations Slow rate;Small sips/bites;Effortful swallow;Multiple dry swallows after each bite/sip Postural Changes Seated upright at 90 degrees   CHL IP OTHER RECOMMENDATIONS 03/30/2020 Recommended Consults -- Oral Care Recommendations Oral care BID Other Recommendations Order thickener from pharmacy;Remove water pitcher;Have oral suction available   CHL IP FOLLOW UP RECOMMENDATIONS 03/30/2020 Follow up Recommendations Inpatient Rehab   CHL IP FREQUENCY AND DURATION 03/30/2020 Speech Therapy Frequency (ACUTE ONLY) min 2x/week Treatment Duration 2 weeks      CHL IP ORAL PHASE 03/30/2020 Oral Phase Impaired Oral - Pudding Teaspoon -- Oral - Pudding Cup -- Oral - Honey Teaspoon -- Oral - Honey Cup Delayed oral transit;Lingual/palatal residue Oral - Nectar Teaspoon -- Oral - Nectar Cup Delayed oral transit;Lingual/palatal residue;Premature spillage Oral - Nectar Straw Premature spillage;Delayed oral transit;Lingual/palatal residue Oral - Thin Teaspoon Left anterior bolus loss;Lingual/palatal residue;Delayed oral transit;Decreased bolus cohesion;Premature spillage Oral - Thin Cup Left anterior bolus loss;Premature spillage;Delayed oral  transit;Lingual/palatal residue Oral - Thin Straw Left anterior bolus loss;Premature spillage;Delayed oral transit;Lingual/palatal residue Oral - Puree Weak lingual manipulation;Lingual/palatal residue;Delayed oral transit Oral - Mech Soft -- Oral - Regular Impaired mastication;Weak lingual manipulation;Lingual/palatal residue;Delayed oral transit Oral - Multi-Consistency -- Oral - Pill -- Oral Phase - Comment --  CHL IP PHARYNGEAL PHASE 03/30/2020 Pharyngeal Phase Impaired Pharyngeal- Pudding Teaspoon -- Pharyngeal -- Pharyngeal- Pudding Cup -- Pharyngeal -- Pharyngeal- Honey Teaspoon -- Pharyngeal -- Pharyngeal- Honey Cup Delayed swallow initiation-vallecula;Pharyngeal residue - valleculae;Pharyngeal residue - pyriform;Pharyngeal residue - posterior pharnyx;Reduced pharyngeal peristalsis;Reduced laryngeal elevation;Reduced airway/laryngeal closure;Reduced epiglottic inversion;Reduced tongue base retraction Pharyngeal Material does not enter airway Pharyngeal- Nectar Teaspoon -- Pharyngeal -- Pharyngeal- Nectar Cup Delayed swallow initiation-vallecula;Penetration/Aspiration during swallow;Reduced anterior laryngeal mobility;Reduced laryngeal elevation;Reduced airway/laryngeal closure;Reduced tongue base retraction;Reduced epiglottic inversion;Pharyngeal residue - valleculae;Pharyngeal residue - pyriform Pharyngeal Material enters airway, remains ABOVE vocal cords and not ejected out Pharyngeal- Nectar Straw Delayed swallow initiation-pyriform sinuses;Pharyngeal residue - valleculae;Penetration/Aspiration during swallow;Reduced epiglottic inversion;Reduced anterior laryngeal mobility;Reduced laryngeal elevation;Reduced airway/laryngeal closure;Reduced tongue base retraction;Trace aspiration Pharyngeal Material enters airway, passes BELOW cords then ejected out Pharyngeal- Thin Teaspoon Delayed swallow initiation-pyriform sinuses;Penetration/Aspiration before swallow;Penetration/Aspiration during swallow;Reduced  epiglottic inversion;Reduced anterior laryngeal mobility;Reduced laryngeal elevation;Reduced airway/laryngeal closure;Reduced tongue base retraction;Trace aspiration;Pharyngeal residue - valleculae Pharyngeal Material enters airway, passes BELOW cords without attempt by patient to eject out (silent aspiration) Pharyngeal- Thin Cup Delayed swallow initiation-pyriform sinuses;Penetration/Aspiration before swallow;Penetration/Apiration after swallow;Reduced epiglottic inversion;Reduced anterior laryngeal mobility;Reduced laryngeal elevation;Reduced airway/laryngeal closure;Reduced tongue base retraction Pharyngeal Material enters airway, passes BELOW cords and not ejected out despite cough attempt by patient Pharyngeal- Thin Straw Delayed swallow initiation-pyriform sinuses;Penetration/Aspiration during swallow;Pharyngeal residue - valleculae;Reduced epiglottic inversion;Reduced anterior laryngeal mobility;Reduced laryngeal elevation;Reduced airway/laryngeal closure;Reduced tongue base retraction Pharyngeal Material enters airway, remains ABOVE vocal cords and not ejected out Pharyngeal- Puree Delayed swallow initiation-vallecula;Pharyngeal residue - valleculae;Pharyngeal residue - pyriform;Reduced anterior laryngeal mobility;Reduced tongue base retraction Pharyngeal Material does not enter airway Pharyngeal- Mechanical Soft -- Pharyngeal -- Pharyngeal- Regular Delayed swallow initiation-vallecula;Reduced anterior laryngeal mobility;Pharyngeal residue - valleculae;Pharyngeal residue - pyriform;Pharyngeal residue - posterior pharnyx Pharyngeal Material does not enter airway Pharyngeal- Multi-consistency -- Pharyngeal -- Pharyngeal- Pill -- Pharyngeal -- Pharyngeal Comment --  CHL IP CERVICAL ESOPHAGEAL PHASE 03/30/2020 Cervical Esophageal Phase WFL Pudding Teaspoon -- Pudding  Cup -- Honey Teaspoon -- Honey Cup -- Nectar Teaspoon -- Nectar Cup -- Nectar Straw -- Thin Teaspoon -- Thin Cup -- Thin Straw -- Puree --  Mechanical Soft -- Regular -- Multi-consistency -- Pill -- Cervical Esophageal Comment -- Colin Mulders M.S., CCC-SLP Acute Rehabilitation Services Office: [phone] [place] 03/30/2020, 1:17 PM              ECHOCARDIOGRAM COMPLETE  Result Date: 03/27/2020    ECHOCARDIOGRAM REPORT   Patient Name:   GAGANDEEP PETTET Niazi Date of Exam: 03/27/2020 Medical Rec #:  [id number]      Height:       72.0 in Accession #:    [id number]     Weight:       174.0 lb Date of Birth:  December 20, 1955      BSA:          2.008 m Patient Age:    75 years       BP:           121/96 mmHg Patient Gender: M              HR:           75 bpm. Exam Location:  Inpatient Procedure: 2D Echo Indications:    Stroke 434.91 / I163.9  History:        Patient has no prior history of Echocardiogram examinations.                 COPD; Risk Factors:Current Smoker. R MCA revascularization this                 morning.  Sonographer:    Darlina Sicilian RDCS Referring Phys: [phone] MCNEILL P KIRKPATRICK  Sonographer Comments: Suboptimal parasternal window, suboptimal apical window and echo performed with patient supine and on artificial respirator. IMPRESSIONS  1. Technically difficult study with limited views. Left ventricular ejection fraction, by estimation, is grossly 55 to 60%. The left ventricle has normal function. Left ventricular diastolic parameters were normal.  2. Right ventricular systolic function is normal. The right ventricular size is normal. Tricuspid regurgitation signal is inadequate for assessing PA pressure.  3. The mitral valve is normal in structure. No evidence of mitral valve regurgitation.  4. The aortic valve was not well visualized. Aortic valve regurgitation is not visualized. No aortic stenosis is present. FINDINGS  Left Ventricle: Left ventricular ejection fraction, by estimation, is 55 to 60%. The left ventricle has normal function. The left ventricle has no regional wall motion abnormalities. The left ventricular internal cavity size  was normal in size. There is  no left ventricular hypertrophy. Left ventricular diastolic parameters were normal. Right Ventricle: The right ventricular size is normal. No increase in right ventricular wall thickness. Right ventricular systolic function is normal. Tricuspid regurgitation signal is inadequate for assessing PA pressure. Left Atrium: Left atrial size was normal in size. Right Atrium: Right atrial size was normal in size. Pericardium: There is no evidence of pericardial effusion. Mitral Valve: The mitral valve is normal in structure. No evidence of mitral valve regurgitation. Tricuspid Valve: The tricuspid valve is normal in structure. Tricuspid valve regurgitation is trivial. Aortic Valve: The aortic valve was not well visualized. Aortic valve regurgitation is not visualized. No aortic stenosis is present. Pulmonic Valve: The pulmonic valve was not well visualized. Pulmonic valve regurgitation is not visualized. Aorta: The aortic root is normal in size and structure. IAS/Shunts: The interatrial septum was not well visualized.  LEFT VENTRICLE  PLAX 2D LVIDd:         4.40 cm  Diastology LVIDs:         3.50 cm  LV e' lateral:   8.70 cm/s LV PW:         0.70 cm  LV E/e' lateral: 6.5 LV IVS:        0.80 cm  LV e' medial:    8.70 cm/s LVOT diam:     2.30 cm  LV E/e' medial:  6.5 LV SV:         54 LV SV Index:   27 LVOT Area:     4.15 cm  LEFT ATRIUM             Index LA diam:        2.50 cm 1.24 cm/m LA Vol (A2C):   88.7 ml 44.17 ml/m LA Vol (A4C):   52.4 ml 26.09 ml/m LA Biplane Vol: 66.4 ml 33.07 ml/m  AORTIC VALVE LVOT Vmax:   52.70 cm/s LVOT Vmean:  35.600 cm/s LVOT VTI:    0.131 m  AORTA Ao Root diam: 3.30 cm MITRAL VALVE MV Area (PHT): 2.62 cm    SHUNTS MV Decel Time: 289 msec    Systemic VTI:  0.13 m MV E velocity: 56.60 cm/s  Systemic Diam: 2.30 cm MV A velocity: 39.80 cm/s MV E/A ratio:  1.42 Oswaldo Milian MD Electronically signed by Oswaldo Milian MD Signature Date/Time:  03/27/2020/2:43:13 PM    Final    IR PERCUTANEOUS ART THROMBECTOMY/INFUSION INTRACRANIAL INC DIAG ANGIO  Result Date: 03/31/2020 INDICATION: New onset left-sided weakness and left-sided neglect. 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 Also bilaterally occluded internal carotid arteries proximally. EXAM: 1. EMERGENT LARGE VESSEL OCCLUSION THROMBOLYSIS (anterior CIRCULATION) COMPARISON:  CT angiogram of the head and neck of March 27, 2020. MEDICATIONS: Ancef 2 g IV antibiotic was administered within 1 hour of the procedure. ANESTHESIA/SEDATION: General anesthesia CONTRAST:  Isovue 300 approximately 200 mL FLUOROSCOPY TIME:  Fluoroscopy Time: 120 minutes 6 seconds (3695 mGy). COMPLICATIONS: None immediate. TECHNIQUE: Following a full explanation of the procedure along with the potential associated complications, an informed witnessed consent was obtained. The risks of intracranial hemorrhage of 10%, worsening neurological deficit, ventilator dependency, death and inability to revascularize were all reviewed in detail with the patient's spouse. The patient was then put under general anesthesia by the Department of Anesthesiology at [facility]. The right groin was prepped and draped in the usual sterile fashion. Thereafter using modified Seldinger technique, transfemoral access into the right common femoral artery was obtained without difficulty. Over a 0.035 inch guidewire a 8 French 25 cm Pinnacle sheath was inserted. Through this, and also over a 0.035 inch guidewire a combination a 5 [place] Bernstein support catheter inside of 087 balloon guide catheter was advanced to the aortic arch region and selectively positioned in the right common carotid artery. The guidewire and the support catheter were retrieved and removed. Good aspiration obtained from the hub of the balloon guide catheter. Gentle control arteriogram  through this demonstrated no evidence of dissection or of intraluminal filling defects. A control arteriogram was then performed centered extra cranially and intracranially. FINDINGS: The right common carotid arteriogram demonstrates the right external carotid artery and its major branches to be widely patent. The right internal carotid artery at the bulb has a string sign opacification at the bulb. There is no distal string sign noted  extending to the skull base or intracranially. However, there is reconstitution of the right internal carotid artery in the cavernous segment with antegrade flow into the supraclinoid segment and the right anterior cerebral artery into the capillary and venous phases. Poor flow seen in the right middle cerebral artery proximally, with faint opacification of the superior division. PROCEDURE: Through the balloon guide catheter in the origin of the left internal carotid bulb, an 021 Trevo ProVue microcatheter was advanced without difficulty over a 0.014 inch standard Synchro micro guidewire through the occluded internal carotid artery to the petrous cavernous segment. The guidewire was removed. Good aspiration obtained from the hub of the microcatheter. A gentle control arteriogram performed through this demonstrated antegrade flow into the supraclinoid right ICA and the right anterior cerebral, and faintly right middle cerebral artery distribution. Microcatheter was then exchanged for a 300 cm 014 inch Synchro standard exchange micro guidewire. The microcatheter was removed. A control arteriogram performed through the balloon catheter in the right common carotid artery demonstrated no significant flow through the occluded right internal carotid artery. At this time, a 4 mm x 30 mm Viatrac 014 inch balloon was prepped and draped in the usual sterile manner. Using the rapid exchange technique, the balloon was advanced to the right internal carotid artery bulb region. The distal and the  proximal markers were aligned appropriately. Thereafter a slow inflation was started with the micro flexion syringe demonstrated almost reocclusion of the previously angioplastied right internal carotid proximally. Almost immediately, there was significant decrease in the patient's heart rate to asystole which lasted for about 5 seconds and responded promptly to deflation. Following treatment for prevention of bradycardia, a repeat angioplasty was then performed to approximately 9 atmospheres for approximately 60 seconds. The balloon was deflated and retrieved and removed. A control arteriogram performed through the balloon guide catheter in the right common carotid artery demonstrated improved flow through the angioplastied segment of the right internal carotid artery. Over the wire, a 071 Zoom catheter was advanced coaxially with an 021 Trevo ProVue microcatheter over the exchange micro guidewire. Once in the petrous segment the micro guidewire was removed. Good aspiration obtained from the hub of the microcatheter. An 014 inch standard Synchro micro guidewire was then introduced and advanced to the distal end of the microcatheter. Using a torque device, the combination of the Zoom intermediary catheter, with the microcatheter was advanced into the origin of the right middle cerebral artery. The micro guidewire was then gently advanced more distally with minimal resistance to the M2 M3 region of the inferior division. The micro guidewire was removed. Good aspiration obtained from the hub of the microcatheter. The 071 Zoom catheter was now advanced into the mid M1 segment. A 4 mm x 40 mm Solitaire X retrieval device was then advanced to the distal end of the microcatheter and deployed in the usual manner without difficulty. At this time the 071 Zoom catheter was engaged into mid of the clot. Constant aspiration was maintained at the hub of the Zoom catheter for approximately 2-1/2 minutes in the locked position.  Following this, the microcatheter with retrieval device, and the Zoom catheter were retrieved and removed. There were chunks of clot noted entangled in the interstices of the retrieval device. A control arteriogram performed through the balloon guide demonstrated near complete reocclusion of the previously angioplastied right ICA. A second pass was then made again using the above combination with the combination of an 021 Trevo ProVue microcatheter inside of a 071 Zoom  intermediary catheter over a 0.014 inch standard Synchro micro guidewire. The combination was navigated through occluded right internal carotid artery proximally without difficulty. Again the inferior division of the right middle cerebral was then entered with the micro guidewire to the M2 M3 region followed by the microcatheter. The guidewire was removed with good aspiration obtained from the hub of the microcatheter. A gentle control arteriogram performed through this demonstrates safe position tip of the microcatheter. This was then connected to continuous heparinized saline infusion. A 4 mm x 40 mm Solitaire X retrieval device was then again deployed the usual manner. The Zoom catheter was then again engaged into the clot with complete occlusion to Penumbra aspiration pump device for 2-1/2 minutes. Combination of the retrieval device, the intermediary catheter, and the microcatheter was retrieved and removed. Again no significant clot was noted in the aspirate or in the retrieval device. A control arteriogram performed through the balloon guide in the right common carotid artery continued to demonstrate now complete occlusion of the right internal carotid artery. This prompted another angioplasty again using the 4 mm x 30 mm Viatrac 14 balloon following the placement of an exchange micro guidewire as described earlier in the petrous segment of the right internal carotid artery. Control angioplasty was then performed to approximately 7.3  atmospheres where it was maintained again for approximately 60 seconds. Upon deflation and proximal retrieval improved flow was noted in the right internal carotid artery. A third pass was then made again using the combination of the 027 microcatheter and a 0.014 inch standard Synchro micro guidewire again in the inferior division. Again following appropriate deployment, with aspiration with proximal flow arrest was again deployed for 2-1/2 minutes. Following removal of the retrieval device, the microcatheter, and also the Zoom intermediary catheter, a control arteriogram performed in the right common carotid artery demonstrated improved visualization of the right middle cerebral artery superior division, the anterior temporal region, and also the inferior division more proximally. A fourth pass was then made this time deploying the same instrumentation but without deployment of the retrieval device. Contact aspiration was maintained for approximately 2-1/2 minutes with the Zoom aspiration catheter. No significant aspiration was noticeable using the pump device for 2-1/2 minutes. This was then retrieved and removed. Again no evidence of clot was seen within the aspiration. Control arteriogram performed through the balloon guide in the right internal carotid artery now demonstrated further near complete occlusion of the right internal carotid artery. A fifth attempt was made again using the above combination with this time deployment of a 3 mm x 20 mm Solitaire X retrieval device in the inferior division M2 region. With contact aspiration with the Zoom catheter engaged in the clot with constant aspiration being applied with a Penumbra pump device four 2-1/2 minutes, the combination of the retrieval device, the microcatheter and the aspiration catheter was retrieved and removed. A control arteriogram performed with balloon guide in the right internal carotid artery origin demonstrated improvement of the inferior  division with now waist throughout the presence of superior division, the anterior temporal branch and also a branch of the inferior division distally. The anterior cerebral artery remained patent. More proximally there was near complete occlusion of the right internal carotid artery which had been previously angioplastied. A fifth angioplasty was then performed using the maneuver described earlier. At this time the balloon was inflated to 9.5 atmospheres in a controlled manner and maintained for approximately a minute and a half. Following the deflation and removal of  the angioplasty balloon and a control arteriogram performed through the balloon guide demonstrated modestly improved caliber and flow through the internal carotid artery proximally. In anticipation of placement of a stent in the right internal carotid artery, a CT scan of the brain was performed which demonstrated mild-to-moderate hyper attenuation in the peri insular, cortical and posterior frontal cortical regions. This suggested the presence of contrast stain with possible associated subarachnoid hemorrhage. No mass effect or midline shift was noted. Diagnostic arteriograms with a 5 [place] JB 1 catheter was then performed of the right vertebral artery, the left common carotid artery and the left vertebral artery. The right vertebral origin demonstrates wide patency. The vessel was seen to opacify to the cranial skull base. Patency of the right posterior-inferior cerebellar artery, the vertebrobasilar junction, the basilar artery, the posterior cerebral arteries, the superior cerebellar arteries and the anterior inferior cerebellar arteries was maintained. There was also prompt retrograde opacification via the right posterior communicating artery, the right anterior cerebral artery distribution, and also the right posterior cerebral artery P3 branches. These delayed arterial phases demonstrated significant retrograde opacification of the right  middle cerebral artery distribution in the cortical and subcortical regions aided by collaterals from the pericallosal branches of the right anterior cerebral artery opacifying the right middle cerebral artery distribution. The left common carotid arteriogram again demonstrates prominence of the right external carotid artery and its branches with retrograde opacification of the caval segment of the left internal carotid artery via collaterals from the lacrimal branches of the external carotid artery retrograde filling the internal carotid artery via the ophthalmic artery. Retrograde opacification of the internal carotid artery to the petrous cavernous junction is also noted without antegrade clearance of contrast. Opacification of the left middle cerebral artery and the left anterior cerebral artery into the capillary and venous phases is present. The left vertebral artery origin demonstrates wide patency. The vessel opacifies to the cranial skull base with wide patency of the left vertebrobasilar junction and the left posterior-inferior cerebellar artery. Opacification of the basilar artery, the posterior cerebral arteries, the superior cerebellar arteries is again noted to be present. Unopacified blood is seen in the basilar artery from the contralateral more dominant vertebral artery. Again demonstrated is retrograde opacification via the posterior communicating arteries of the anterior cerebral artery distribution bilaterally. Previously mentioned collaterals in the anterior cerebral artery and P3 segments of the posterior cerebral artery are seen on the right side. In view of the extensive collaterals, and the hemo dynamic instability induced by balloon angioplasty of the right internal carotid proximally and significant recoil with multiple balloon angioplasties, further treatment with stent placement in the right internal carotid artery and exposure to dual antiplatelets, and intravenous cangrelor would  predispose to intracranial hemorrhage with neurological worsening, and also a chance of reocclusion of the stent. The procedure was therefore terminated. The balloon guide and the 8 French Pinnacle sheath in the right groin were removed with successful placement of an 8 French Angio-Seal closure device for hemostasis. Distal pulses remained Dopplerable bilaterally unchanged. Patient was left intubated because of his neurologic condition, and to protect his airway. He was then transferred to the neuro ICU for post revascularization management. IMPRESSION: Status post endovascular revascularization of occluded right middle cerebral artery proximally, and of the right insular inferior division, with 3 passes with a Solitaire 4 mm x 40 mm retrieval device, 1 pass with the Solitaire 3 mm x 20 mm retrieval device x1 with constant aspiration device achieving a TICI 2a/TICI  2b revascularization. Extensive robust collaterals from both PCAs in the P3 segments, and the posterior communicating artery retrogradely reconstituting the right middle cerebral artery distribution. PLAN: Follow-up in the clinic 4 weeks post discharge. Electronically Signed   By: Luanne Bras M.D.   On: 03/28/2020 11:25   CT HEAD CODE STROKE WO CONTRAST  Result Date: 03/27/2020 CLINICAL DATA:  Code stroke. Initial evaluation for acute left-sided weakness. EXAM: CT HEAD WITHOUT CONTRAST TECHNIQUE: Contiguous axial images were obtained from the base of the skull through the vertex without intravenous contrast. COMPARISON:  None available. FINDINGS: Brain: Generalized age-related cerebral atrophy. No acute intracranial hemorrhage. There is subtle asymmetric hypodensity involving the right caudate and lentiform nuclei, suspicious for early developing right MCA territory ischemia. Gray-white matter differentiation otherwise grossly maintained elsewhere within the brain. No mass lesion, midline shift or mass effect. No hydrocephalus or extra-axial  fluid collection. Vascular: Dense right M1 segment concerning for thrombus and LVO. Skull: Scalp soft tissues within normal limits.  Calvarium intact. Sinuses/Orbits: Globes and orbital soft tissues within normal limits. Chronic mucoperiosteal thickening noted within the ethmoidal air cells. Paranasal sinuses are otherwise clear. No mastoid effusion. Other: None. ASPECTS [facility] Stroke Program Early CT Score) - Ganglionic level infarction (caudate, lentiform nuclei, internal capsule, insula, M1-M3 cortex): 5 - Supraganglionic infarction (M4-M6 cortex): 3 Total score (0-10 with 10 being normal): 8 IMPRESSION: 1. Dense right M1 segment, concerning for thrombus/LVO. Subtle asymmetric hypodensity involving the right caudate and lentiform nuclei concerning for early ischemic changes. No intracranial hemorrhage. 2. ASPECTS is 8. These results were communicated to Dr. Leonel Ramsay at [place] 7/22/2021by text page via the [facility] messaging system. Electronically Signed   By: Jeannine Boga M.D.   On: 03/27/2020 01:51   IR ANGIO INTRA EXTRACRAN SEL COM CAROTID INNOMINATE UNI L MOD SED  Result Date: 03/31/2020 INDICATION: New onset left-sided weakness and left-sided neglect. 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 Also bilaterally occluded internal carotid arteries proximally. EXAM: 1. EMERGENT LARGE VESSEL OCCLUSION THROMBOLYSIS (anterior CIRCULATION) COMPARISON:  CT angiogram of the head and neck of March 27, 2020. MEDICATIONS: Ancef 2 g IV antibiotic was administered within 1 hour of the procedure. ANESTHESIA/SEDATION: General anesthesia CONTRAST:  Isovue 300 approximately 200 mL FLUOROSCOPY TIME:  Fluoroscopy Time: 120 minutes 6 seconds (3695 mGy). COMPLICATIONS: None immediate. TECHNIQUE: Following a full explanation of the procedure along with the potential associated complications, an informed witnessed consent was  obtained. The risks of intracranial hemorrhage of 10%, worsening neurological deficit, ventilator dependency, death and inability to revascularize were all reviewed in detail with the patient's spouse. The patient was then put under general anesthesia by the Department of Anesthesiology at [facility]. The right groin was prepped and draped in the usual sterile fashion. Thereafter using modified Seldinger technique, transfemoral access into the right common femoral artery was obtained without difficulty. Over a 0.035 inch guidewire a 8 French 25 cm Pinnacle sheath was inserted. Through this, and also over a 0.035 inch guidewire a combination a 5 [place] Bernstein support catheter inside of 087 balloon guide catheter was advanced to the aortic arch region and selectively positioned in the right common carotid artery. The guidewire and the support catheter were retrieved and removed. Good aspiration obtained from the hub of the balloon guide catheter. Gentle control arteriogram through this demonstrated no evidence of dissection or of intraluminal filling defects. A control arteriogram was then performed centered  extra cranially and intracranially. FINDINGS: The right common carotid arteriogram demonstrates the right external carotid artery and its major branches to be widely patent. The right internal carotid artery at the bulb has a string sign opacification at the bulb. There is no distal string sign noted extending to the skull base or intracranially. However, there is reconstitution of the right internal carotid artery in the cavernous segment with antegrade flow into the supraclinoid segment and the right anterior cerebral artery into the capillary and venous phases. Poor flow seen in the right middle cerebral artery proximally, with faint opacification of the superior division. PROCEDURE: Through the balloon guide catheter in the origin of the left internal carotid bulb, an 021 Trevo ProVue microcatheter  was advanced without difficulty over a 0.014 inch standard Synchro micro guidewire through the occluded internal carotid artery to the petrous cavernous segment. The guidewire was removed. Good aspiration obtained from the hub of the microcatheter. A gentle control arteriogram performed through this demonstrated antegrade flow into the supraclinoid right ICA and the right anterior cerebral, and faintly right middle cerebral artery distribution. Microcatheter was then exchanged for a 300 cm 014 inch Synchro standard exchange micro guidewire. The microcatheter was removed. A control arteriogram performed through the balloon catheter in the right common carotid artery demonstrated no significant flow through the occluded right internal carotid artery. At this time, a 4 mm x 30 mm Viatrac 014 inch balloon was prepped and draped in the usual sterile manner. Using the rapid exchange technique, the balloon was advanced to the right internal carotid artery bulb region. The distal and the proximal markers were aligned appropriately. Thereafter a slow inflation was started with the micro flexion syringe demonstrated almost reocclusion of the previously angioplastied right internal carotid proximally. Almost immediately, there was significant decrease in the patient's heart rate to asystole which lasted for about 5 seconds and responded promptly to deflation. Following treatment for prevention of bradycardia, a repeat angioplasty was then performed to approximately 9 atmospheres for approximately 60 seconds. The balloon was deflated and retrieved and removed. A control arteriogram performed through the balloon guide catheter in the right common carotid artery demonstrated improved flow through the angioplastied segment of the right internal carotid artery. Over the wire, a 071 Zoom catheter was advanced coaxially with an 021 Trevo ProVue microcatheter over the exchange micro guidewire. Once in the petrous segment the micro  guidewire was removed. Good aspiration obtained from the hub of the microcatheter. An 014 inch standard Synchro micro guidewire was then introduced and advanced to the distal end of the microcatheter. Using a torque device, the combination of the Zoom intermediary catheter, with the microcatheter was advanced into the origin of the right middle cerebral artery. The micro guidewire was then gently advanced more distally with minimal resistance to the M2 M3 region of the inferior division. The micro guidewire was removed. Good aspiration obtained from the hub of the microcatheter. The 071 Zoom catheter was now advanced into the mid M1 segment. A 4 mm x 40 mm Solitaire X retrieval device was then advanced to the distal end of the microcatheter and deployed in the usual manner without difficulty. At this time the 071 Zoom catheter was engaged into mid of the clot. Constant aspiration was maintained at the hub of the Zoom catheter for approximately 2-1/2 minutes in the locked position. Following this, the microcatheter with retrieval device, and the Zoom catheter were retrieved and removed. There were chunks of clot noted entangled in the interstices  of the retrieval device. A control arteriogram performed through the balloon guide demonstrated near complete reocclusion of the previously angioplastied right ICA. A second pass was then made again using the above combination with the combination of an 021 Trevo ProVue microcatheter inside of a 071 Zoom intermediary catheter over a 0.014 inch standard Synchro micro guidewire. The combination was navigated through occluded right internal carotid artery proximally without difficulty. Again the inferior division of the right middle cerebral was then entered with the micro guidewire to the M2 M3 region followed by the microcatheter. The guidewire was removed with good aspiration obtained from the hub of the microcatheter. A gentle control arteriogram performed through this  demonstrates safe position tip of the microcatheter. This was then connected to continuous heparinized saline infusion. A 4 mm x 40 mm Solitaire X retrieval device was then again deployed the usual manner. The Zoom catheter was then again engaged into the clot with complete occlusion to Penumbra aspiration pump device for 2-1/2 minutes. Combination of the retrieval device, the intermediary catheter, and the microcatheter was retrieved and removed. Again no significant clot was noted in the aspirate or in the retrieval device. A control arteriogram performed through the balloon guide in the right common carotid artery continued to demonstrate now complete occlusion of the right internal carotid artery. This prompted another angioplasty again using the 4 mm x 30 mm Viatrac 14 balloon following the placement of an exchange micro guidewire as described earlier in the petrous segment of the right internal carotid artery. Control angioplasty was then performed to approximately 7.3 atmospheres where it was maintained again for approximately 60 seconds. Upon deflation and proximal retrieval improved flow was noted in the right internal carotid artery. A third pass was then made again using the combination of the 027 microcatheter and a 0.014 inch standard Synchro micro guidewire again in the inferior division. Again following appropriate deployment, with aspiration with proximal flow arrest was again deployed for 2-1/2 minutes. Following removal of the retrieval device, the microcatheter, and also the Zoom intermediary catheter, a control arteriogram performed in the right common carotid artery demonstrated improved visualization of the right middle cerebral artery superior division, the anterior temporal region, and also the inferior division more proximally. A fourth pass was then made this time deploying the same instrumentation but without deployment of the retrieval device. Contact aspiration was maintained for  approximately 2-1/2 minutes with the Zoom aspiration catheter. No significant aspiration was noticeable using the pump device for 2-1/2 minutes. This was then retrieved and removed. Again no evidence of clot was seen within the aspiration. Control arteriogram performed through the balloon guide in the right internal carotid artery now demonstrated further near complete occlusion of the right internal carotid artery. A fifth attempt was made again using the above combination with this time deployment of a 3 mm x 20 mm Solitaire X retrieval device in the inferior division M2 region. With contact aspiration with the Zoom catheter engaged in the clot with constant aspiration being applied with a Penumbra pump device four 2-1/2 minutes, the combination of the retrieval device, the microcatheter and the aspiration catheter was retrieved and removed. A control arteriogram performed with balloon guide in the right internal carotid artery origin demonstrated improvement of the inferior division with now waist throughout the presence of superior division, the anterior temporal branch and also a branch of the inferior division distally. The anterior cerebral artery remained patent. More proximally there was near complete occlusion of the right internal  carotid artery which had been previously angioplastied. A fifth angioplasty was then performed using the maneuver described earlier. At this time the balloon was inflated to 9.5 atmospheres in a controlled manner and maintained for approximately a minute and a half. Following the deflation and removal of the angioplasty balloon and a control arteriogram performed through the balloon guide demonstrated modestly improved caliber and flow through the internal carotid artery proximally. In anticipation of placement of a stent in the right internal carotid artery, a CT scan of the brain was performed which demonstrated mild-to-moderate hyper attenuation in the peri insular, cortical  and posterior frontal cortical regions. This suggested the presence of contrast stain with possible associated subarachnoid hemorrhage. No mass effect or midline shift was noted. Diagnostic arteriograms with a 5 [place] JB 1 catheter was then performed of the right vertebral artery, the left common carotid artery and the left vertebral artery. The right vertebral origin demonstrates wide patency. The vessel was seen to opacify to the cranial skull base. Patency of the right posterior-inferior cerebellar artery, the vertebrobasilar junction, the basilar artery, the posterior cerebral arteries, the superior cerebellar arteries and the anterior inferior cerebellar arteries was maintained. There was also prompt retrograde opacification via the right posterior communicating artery, the right anterior cerebral artery distribution, and also the right posterior cerebral artery P3 branches. These delayed arterial phases demonstrated significant retrograde opacification of the right middle cerebral artery distribution in the cortical and subcortical regions aided by collaterals from the pericallosal branches of the right anterior cerebral artery opacifying the right middle cerebral artery distribution. The left common carotid arteriogram again demonstrates prominence of the right external carotid artery and its branches with retrograde opacification of the caval segment of the left internal carotid artery via collaterals from the lacrimal branches of the external carotid artery retrograde filling the internal carotid artery via the ophthalmic artery. Retrograde opacification of the internal carotid artery to the petrous cavernous junction is also noted without antegrade clearance of contrast. Opacification of the left middle cerebral artery and the left anterior cerebral artery into the capillary and venous phases is present. The left vertebral artery origin demonstrates wide patency. The vessel opacifies to the cranial skull  base with wide patency of the left vertebrobasilar junction and the left posterior-inferior cerebellar artery. Opacification of the basilar artery, the posterior cerebral arteries, the superior cerebellar arteries is again noted to be present. Unopacified blood is seen in the basilar artery from the contralateral more dominant vertebral artery. Again demonstrated is retrograde opacification via the posterior communicating arteries of the anterior cerebral artery distribution bilaterally. Previously mentioned collaterals in the anterior cerebral artery and P3 segments of the posterior cerebral artery are seen on the right side. In view of the extensive collaterals, and the hemo dynamic instability induced by balloon angioplasty of the right internal carotid proximally and significant recoil with multiple balloon angioplasties, further treatment with stent placement in the right internal carotid artery and exposure to dual antiplatelets, and intravenous cangrelor would predispose to intracranial hemorrhage with neurological worsening, and also a chance of reocclusion of the stent. The procedure was therefore terminated. The balloon guide and the 8 French Pinnacle sheath in the right groin were removed with successful placement of an 8 French Angio-Seal closure device for hemostasis. Distal pulses remained Dopplerable bilaterally unchanged. Patient was left intubated because of his neurologic condition, and to protect his airway. He was then transferred to the neuro ICU for post revascularization management. IMPRESSION: Status post endovascular revascularization of  occluded right middle cerebral artery proximally, and of the right insular inferior division, with 3 passes with a Solitaire 4 mm x 40 mm retrieval device, 1 pass with the Solitaire 3 mm x 20 mm retrieval device x1 with constant aspiration device achieving a TICI 2a/TICI 2b revascularization. Extensive robust collaterals from both PCAs in the P3 segments,  and the posterior communicating artery retrogradely reconstituting the right middle cerebral artery distribution. PLAN: Follow-up in the clinic 4 weeks post discharge. Electronically Signed   By: Luanne Bras M.D.   On: 03/28/2020 11:25   IR ANGIO VERTEBRAL SEL SUBCLAVIAN INNOMINATE UNI L MOD SED  Result Date: 03/31/2020 INDICATION: New onset left-sided weakness and left-sided neglect. 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 Also bilaterally occluded internal carotid arteries proximally. EXAM: 1. EMERGENT LARGE VESSEL OCCLUSION THROMBOLYSIS (anterior CIRCULATION) COMPARISON:  CT angiogram of the head and neck of March 27, 2020. MEDICATIONS: Ancef 2 g IV antibiotic was administered within 1 hour of the procedure. ANESTHESIA/SEDATION: General anesthesia CONTRAST:  Isovue 300 approximately 200 mL FLUOROSCOPY TIME:  Fluoroscopy Time: 120 minutes 6 seconds (3695 mGy). COMPLICATIONS: None immediate. TECHNIQUE: Following a full explanation of the procedure along with the potential associated complications, an informed witnessed consent was obtained. The risks of intracranial hemorrhage of 10%, worsening neurological deficit, ventilator dependency, death and inability to revascularize were all reviewed in detail with the patient's spouse. The patient was then put under general anesthesia by the Department of Anesthesiology at [facility]. The right groin was prepped and draped in the usual sterile fashion. Thereafter using modified Seldinger technique, transfemoral access into the right common femoral artery was obtained without difficulty. Over a 0.035 inch guidewire a 8 French 25 cm Pinnacle sheath was inserted. Through this, and also over a 0.035 inch guidewire a combination a 5 [place] Bernstein support catheter inside of 087 balloon guide catheter was advanced to the aortic arch region and selectively positioned  in the right common carotid artery. The guidewire and the support catheter were retrieved and removed. Good aspiration obtained from the hub of the balloon guide catheter. Gentle control arteriogram through this demonstrated no evidence of dissection or of intraluminal filling defects. A control arteriogram was then performed centered extra cranially and intracranially. FINDINGS: The right common carotid arteriogram demonstrates the right external carotid artery and its major branches to be widely patent. The right internal carotid artery at the bulb has a string sign opacification at the bulb. There is no distal string sign noted extending to the skull base or intracranially. However, there is reconstitution of the right internal carotid artery in the cavernous segment with antegrade flow into the supraclinoid segment and the right anterior cerebral artery into the capillary and venous phases. Poor flow seen in the right middle cerebral artery proximally, with faint opacification of the superior division. PROCEDURE: Through the balloon guide catheter in the origin of the left internal carotid bulb, an 021 Trevo ProVue microcatheter was advanced without difficulty over a 0.014 inch standard Synchro micro guidewire through the occluded internal carotid artery to the petrous cavernous segment. The guidewire was removed. Good aspiration obtained from the hub of the microcatheter. A gentle control arteriogram performed through this demonstrated antegrade flow into the supraclinoid right ICA and the right anterior cerebral, and faintly right middle cerebral artery distribution. Microcatheter was then exchanged for a 300 cm 014 inch Synchro standard exchange micro guidewire. The microcatheter  was removed. A control arteriogram performed through the balloon catheter in the right common carotid artery demonstrated no significant flow through the occluded right internal carotid artery. At this time, a 4 mm x 30 mm Viatrac  014 inch balloon was prepped and draped in the usual sterile manner. Using the rapid exchange technique, the balloon was advanced to the right internal carotid artery bulb region. The distal and the proximal markers were aligned appropriately. Thereafter a slow inflation was started with the micro flexion syringe demonstrated almost reocclusion of the previously angioplastied right internal carotid proximally. Almost immediately, there was significant decrease in the patient's heart rate to asystole which lasted for about 5 seconds and responded promptly to deflation. Following treatment for prevention of bradycardia, a repeat angioplasty was then performed to approximately 9 atmospheres for approximately 60 seconds. The balloon was deflated and retrieved and removed. A control arteriogram performed through the balloon guide catheter in the right common carotid artery demonstrated improved flow through the angioplastied segment of the right internal carotid artery. Over the wire, a 071 Zoom catheter was advanced coaxially with an 021 Trevo ProVue microcatheter over the exchange micro guidewire. Once in the petrous segment the micro guidewire was removed. Good aspiration obtained from the hub of the microcatheter. An 014 inch standard Synchro micro guidewire was then introduced and advanced to the distal end of the microcatheter. Using a torque device, the combination of the Zoom intermediary catheter, with the microcatheter was advanced into the origin of the right middle cerebral artery. The micro guidewire was then gently advanced more distally with minimal resistance to the M2 M3 region of the inferior division. The micro guidewire was removed. Good aspiration obtained from the hub of the microcatheter. The 071 Zoom catheter was now advanced into the mid M1 segment. A 4 mm x 40 mm Solitaire X retrieval device was then advanced to the distal end of the microcatheter and deployed in the usual manner without  difficulty. At this time the 071 Zoom catheter was engaged into mid of the clot. Constant aspiration was maintained at the hub of the Zoom catheter for approximately 2-1/2 minutes in the locked position. Following this, the microcatheter with retrieval device, and the Zoom catheter were retrieved and removed. There were chunks of clot noted entangled in the interstices of the retrieval device. A control arteriogram performed through the balloon guide demonstrated near complete reocclusion of the previously angioplastied right ICA. A second pass was then made again using the above combination with the combination of an 021 Trevo ProVue microcatheter inside of a 071 Zoom intermediary catheter over a 0.014 inch standard Synchro micro guidewire. The combination was navigated through occluded right internal carotid artery proximally without difficulty. Again the inferior division of the right middle cerebral was then entered with the micro guidewire to the M2 M3 region followed by the microcatheter. The guidewire was removed with good aspiration obtained from the hub of the microcatheter. A gentle control arteriogram performed through this demonstrates safe position tip of the microcatheter. This was then connected to continuous heparinized saline infusion. A 4 mm x 40 mm Solitaire X retrieval device was then again deployed the usual manner. The Zoom catheter was then again engaged into the clot with complete occlusion to Penumbra aspiration pump device for 2-1/2 minutes. Combination of the retrieval device, the intermediary catheter, and the microcatheter was retrieved and removed. Again no significant clot was noted in the aspirate or in the retrieval device. A control arteriogram performed through  the balloon guide in the right common carotid artery continued to demonstrate now complete occlusion of the right internal carotid artery. This prompted another angioplasty again using the 4 mm x 30 mm Viatrac 14 balloon  following the placement of an exchange micro guidewire as described earlier in the petrous segment of the right internal carotid artery. Control angioplasty was then performed to approximately 7.3 atmospheres where it was maintained again for approximately 60 seconds. Upon deflation and proximal retrieval improved flow was noted in the right internal carotid artery. A third pass was then made again using the combination of the 027 microcatheter and a 0.014 inch standard Synchro micro guidewire again in the inferior division. Again following appropriate deployment, with aspiration with proximal flow arrest was again deployed for 2-1/2 minutes. Following removal of the retrieval device, the microcatheter, and also the Zoom intermediary catheter, a control arteriogram performed in the right common carotid artery demonstrated improved visualization of the right middle cerebral artery superior division, the anterior temporal region, and also the inferior division more proximally. A fourth pass was then made this time deploying the same instrumentation but without deployment of the retrieval device. Contact aspiration was maintained for approximately 2-1/2 minutes with the Zoom aspiration catheter. No significant aspiration was noticeable using the pump device for 2-1/2 minutes. This was then retrieved and removed. Again no evidence of clot was seen within the aspiration. Control arteriogram performed through the balloon guide in the right internal carotid artery now demonstrated further near complete occlusion of the right internal carotid artery. A fifth attempt was made again using the above combination with this time deployment of a 3 mm x 20 mm Solitaire X retrieval device in the inferior division M2 region. With contact aspiration with the Zoom catheter engaged in the clot with constant aspiration being applied with a Penumbra pump device four 2-1/2 minutes, the combination of the retrieval device, the microcatheter  and the aspiration catheter was retrieved and removed. A control arteriogram performed with balloon guide in the right internal carotid artery origin demonstrated improvement of the inferior division with now waist throughout the presence of superior division, the anterior temporal branch and also a branch of the inferior division distally. The anterior cerebral artery remained patent. More proximally there was near complete occlusion of the right internal carotid artery which had been previously angioplastied. A fifth angioplasty was then performed using the maneuver described earlier. At this time the balloon was inflated to 9.5 atmospheres in a controlled manner and maintained for approximately a minute and a half. Following the deflation and removal of the angioplasty balloon and a control arteriogram performed through the balloon guide demonstrated modestly improved caliber and flow through the internal carotid artery proximally. In anticipation of placement of a stent in the right internal carotid artery, a CT scan of the brain was performed which demonstrated mild-to-moderate hyper attenuation in the peri insular, cortical and posterior frontal cortical regions. This suggested the presence of contrast stain with possible associated subarachnoid hemorrhage. No mass effect or midline shift was noted. Diagnostic arteriograms with a 5 [place] JB 1 catheter was then performed of the right vertebral artery, the left common carotid artery and the left vertebral artery. The right vertebral origin demonstrates wide patency. The vessel was seen to opacify to the cranial skull base. Patency of the right posterior-inferior cerebellar artery, the vertebrobasilar junction, the basilar artery, the posterior cerebral arteries, the superior cerebellar arteries and the anterior inferior cerebellar arteries was maintained. There was also  prompt retrograde opacification via the right posterior communicating artery, the right  anterior cerebral artery distribution, and also the right posterior cerebral artery P3 branches. These delayed arterial phases demonstrated significant retrograde opacification of the right middle cerebral artery distribution in the cortical and subcortical regions aided by collaterals from the pericallosal branches of the right anterior cerebral artery opacifying the right middle cerebral artery distribution. The left common carotid arteriogram again demonstrates prominence of the right external carotid artery and its branches with retrograde opacification of the caval segment of the left internal carotid artery via collaterals from the lacrimal branches of the external carotid artery retrograde filling the internal carotid artery via the ophthalmic artery. Retrograde opacification of the internal carotid artery to the petrous cavernous junction is also noted without antegrade clearance of contrast. Opacification of the left middle cerebral artery and the left anterior cerebral artery into the capillary and venous phases is present. The left vertebral artery origin demonstrates wide patency. The vessel opacifies to the cranial skull base with wide patency of the left vertebrobasilar junction and the left posterior-inferior cerebellar artery. Opacification of the basilar artery, the posterior cerebral arteries, the superior cerebellar arteries is again noted to be present. Unopacified blood is seen in the basilar artery from the contralateral more dominant vertebral artery. Again demonstrated is retrograde opacification via the posterior communicating arteries of the anterior cerebral artery distribution bilaterally. Previously mentioned collaterals in the anterior cerebral artery and P3 segments of the posterior cerebral artery are seen on the right side. In view of the extensive collaterals, and the hemo dynamic instability induced by balloon angioplasty of the right internal carotid proximally and significant  recoil with multiple balloon angioplasties, further treatment with stent placement in the right internal carotid artery and exposure to dual antiplatelets, and intravenous cangrelor would predispose to intracranial hemorrhage with neurological worsening, and also a chance of reocclusion of the stent. The procedure was therefore terminated. The balloon guide and the 8 French Pinnacle sheath in the right groin were removed with successful placement of an 8 French Angio-Seal closure device for hemostasis. Distal pulses remained Dopplerable bilaterally unchanged. Patient was left intubated because of his neurologic condition, and to protect his airway. He was then transferred to the neuro ICU for post revascularization management. IMPRESSION: Status post endovascular revascularization of occluded right middle cerebral artery proximally, and of the right insular inferior division, with 3 passes with a Solitaire 4 mm x 40 mm retrieval device, 1 pass with the Solitaire 3 mm x 20 mm retrieval device x1 with constant aspiration device achieving a TICI 2a/TICI 2b revascularization. Extensive robust collaterals from both PCAs in the P3 segments, and the posterior communicating artery retrogradely reconstituting the right middle cerebral artery distribution. PLAN: Follow-up in the clinic 4 weeks post discharge. Electronically Signed   By: Luanne Bras M.D.   On: 03/28/2020 11:25   IR ANGIO VERTEBRAL SEL VERTEBRAL UNI R MOD SED  Result Date: 03/31/2020 INDICATION: New onset left-sided weakness and left-sided neglect. 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 Also bilaterally occluded internal carotid arteries proximally. EXAM: 1. EMERGENT LARGE VESSEL OCCLUSION THROMBOLYSIS (anterior CIRCULATION) COMPARISON:  CT angiogram of the head and neck of March 27, 2020. MEDICATIONS: Ancef 2 g IV antibiotic was administered within 1 hour of  the procedure. ANESTHESIA/SEDATION: General anesthesia CONTRAST:  Isovue 300 approximately 200 mL FLUOROSCOPY TIME:  Fluoroscopy Time: 120 minutes 6 seconds (3695 mGy). COMPLICATIONS:  None immediate. TECHNIQUE: Following a full explanation of the procedure along with the potential associated complications, an informed witnessed consent was obtained. The risks of intracranial hemorrhage of 10%, worsening neurological deficit, ventilator dependency, death and inability to revascularize were all reviewed in detail with the patient's spouse. The patient was then put under general anesthesia by the Department of Anesthesiology at [facility]. The right groin was prepped and draped in the usual sterile fashion. Thereafter using modified Seldinger technique, transfemoral access into the right common femoral artery was obtained without difficulty. Over a 0.035 inch guidewire a 8 French 25 cm Pinnacle sheath was inserted. Through this, and also over a 0.035 inch guidewire a combination a 5 [place] Bernstein support catheter inside of 087 balloon guide catheter was advanced to the aortic arch region and selectively positioned in the right common carotid artery. The guidewire and the support catheter were retrieved and removed. Good aspiration obtained from the hub of the balloon guide catheter. Gentle control arteriogram through this demonstrated no evidence of dissection or of intraluminal filling defects. A control arteriogram was then performed centered extra cranially and intracranially. FINDINGS: The right common carotid arteriogram demonstrates the right external carotid artery and its major branches to be widely patent. The right internal carotid artery at the bulb has a string sign opacification at the bulb. There is no distal string sign noted extending to the skull base or intracranially. However, there is reconstitution of the right internal carotid artery in the cavernous segment with antegrade flow into  the supraclinoid segment and the right anterior cerebral artery into the capillary and venous phases. Poor flow seen in the right middle cerebral artery proximally, with faint opacification of the superior division. PROCEDURE: Through the balloon guide catheter in the origin of the left internal carotid bulb, an 021 Trevo ProVue microcatheter was advanced without difficulty over a 0.014 inch standard Synchro micro guidewire through the occluded internal carotid artery to the petrous cavernous segment. The guidewire was removed. Good aspiration obtained from the hub of the microcatheter. A gentle control arteriogram performed through this demonstrated antegrade flow into the supraclinoid right ICA and the right anterior cerebral, and faintly right middle cerebral artery distribution. Microcatheter was then exchanged for a 300 cm 014 inch Synchro standard exchange micro guidewire. The microcatheter was removed. A control arteriogram performed through the balloon catheter in the right common carotid artery demonstrated no significant flow through the occluded right internal carotid artery. At this time, a 4 mm x 30 mm Viatrac 014 inch balloon was prepped and draped in the usual sterile manner. Using the rapid exchange technique, the balloon was advanced to the right internal carotid artery bulb region. The distal and the proximal markers were aligned appropriately. Thereafter a slow inflation was started with the micro flexion syringe demonstrated almost reocclusion of the previously angioplastied right internal carotid proximally. Almost immediately, there was significant decrease in the patient's heart rate to asystole which lasted for about 5 seconds and responded promptly to deflation. Following treatment for prevention of bradycardia, a repeat angioplasty was then performed to approximately 9 atmospheres for approximately 60 seconds. The balloon was deflated and retrieved and removed. A control arteriogram  performed through the balloon guide catheter in the right common carotid artery demonstrated improved flow through the angioplastied segment of the right internal carotid artery. Over the wire, a 071 Zoom catheter was advanced coaxially with an 021 Trevo ProVue microcatheter over the exchange micro guidewire. Once in the petrous segment the  micro guidewire was removed. Good aspiration obtained from the hub of the microcatheter. An 014 inch standard Synchro micro guidewire was then introduced and advanced to the distal end of the microcatheter. Using a torque device, the combination of the Zoom intermediary catheter, with the microcatheter was advanced into the origin of the right middle cerebral artery. The micro guidewire was then gently advanced more distally with minimal resistance to the M2 M3 region of the inferior division. The micro guidewire was removed. Good aspiration obtained from the hub of the microcatheter. The 071 Zoom catheter was now advanced into the mid M1 segment. A 4 mm x 40 mm Solitaire X retrieval device was then advanced to the distal end of the microcatheter and deployed in the usual manner without difficulty. At this time the 071 Zoom catheter was engaged into mid of the clot. Constant aspiration was maintained at the hub of the Zoom catheter for approximately 2-1/2 minutes in the locked position. Following this, the microcatheter with retrieval device, and the Zoom catheter were retrieved and removed. There were chunks of clot noted entangled in the interstices of the retrieval device. A control arteriogram performed through the balloon guide demonstrated near complete reocclusion of the previously angioplastied right ICA. A second pass was then made again using the above combination with the combination of an 021 Trevo ProVue microcatheter inside of a 071 Zoom intermediary catheter over a 0.014 inch standard Synchro micro guidewire. The combination was navigated through occluded right  internal carotid artery proximally without difficulty. Again the inferior division of the right middle cerebral was then entered with the micro guidewire to the M2 M3 region followed by the microcatheter. The guidewire was removed with good aspiration obtained from the hub of the microcatheter. A gentle control arteriogram performed through this demonstrates safe position tip of the microcatheter. This was then connected to continuous heparinized saline infusion. A 4 mm x 40 mm Solitaire X retrieval device was then again deployed the usual manner. The Zoom catheter was then again engaged into the clot with complete occlusion to Penumbra aspiration pump device for 2-1/2 minutes. Combination of the retrieval device, the intermediary catheter, and the microcatheter was retrieved and removed. Again no significant clot was noted in the aspirate or in the retrieval device. A control arteriogram performed through the balloon guide in the right common carotid artery continued to demonstrate now complete occlusion of the right internal carotid artery. This prompted another angioplasty again using the 4 mm x 30 mm Viatrac 14 balloon following the placement of an exchange micro guidewire as described earlier in the petrous segment of the right internal carotid artery. Control angioplasty was then performed to approximately 7.3 atmospheres where it was maintained again for approximately 60 seconds. Upon deflation and proximal retrieval improved flow was noted in the right internal carotid artery. A third pass was then made again using the combination of the 027 microcatheter and a 0.014 inch standard Synchro micro guidewire again in the inferior division. Again following appropriate deployment, with aspiration with proximal flow arrest was again deployed for 2-1/2 minutes. Following removal of the retrieval device, the microcatheter, and also the Zoom intermediary catheter, a control arteriogram performed in the right common  carotid artery demonstrated improved visualization of the right middle cerebral artery superior division, the anterior temporal region, and also the inferior division more proximally. A fourth pass was then made this time deploying the same instrumentation but without deployment of the retrieval device. Contact aspiration was maintained for approximately  2-1/2 minutes with the Zoom aspiration catheter. No significant aspiration was noticeable using the pump device for 2-1/2 minutes. This was then retrieved and removed. Again no evidence of clot was seen within the aspiration. Control arteriogram performed through the balloon guide in the right internal carotid artery now demonstrated further near complete occlusion of the right internal carotid artery. A fifth attempt was made again using the above combination with this time deployment of a 3 mm x 20 mm Solitaire X retrieval device in the inferior division M2 region. With contact aspiration with the Zoom catheter engaged in the clot with constant aspiration being applied with a Penumbra pump device four 2-1/2 minutes, the combination of the retrieval device, the microcatheter and the aspiration catheter was retrieved and removed. A control arteriogram performed with balloon guide in the right internal carotid artery origin demonstrated improvement of the inferior division with now waist throughout the presence of superior division, the anterior temporal branch and also a branch of the inferior division distally. The anterior cerebral artery remained patent. More proximally there was near complete occlusion of the right internal carotid artery which had been previously angioplastied. A fifth angioplasty was then performed using the maneuver described earlier. At this time the balloon was inflated to 9.5 atmospheres in a controlled manner and maintained for approximately a minute and a half. Following the deflation and removal of the angioplasty balloon and a control  arteriogram performed through the balloon guide demonstrated modestly improved caliber and flow through the internal carotid artery proximally. In anticipation of placement of a stent in the right internal carotid artery, a CT scan of the brain was performed which demonstrated mild-to-moderate hyper attenuation in the peri insular, cortical and posterior frontal cortical regions. This suggested the presence of contrast stain with possible associated subarachnoid hemorrhage. No mass effect or midline shift was noted. Diagnostic arteriograms with a 5 [place] JB 1 catheter was then performed of the right vertebral artery, the left common carotid artery and the left vertebral artery. The right vertebral origin demonstrates wide patency. The vessel was seen to opacify to the cranial skull base. Patency of the right posterior-inferior cerebellar artery, the vertebrobasilar junction, the basilar artery, the posterior cerebral arteries, the superior cerebellar arteries and the anterior inferior cerebellar arteries was maintained. There was also prompt retrograde opacification via the right posterior communicating artery, the right anterior cerebral artery distribution, and also the right posterior cerebral artery P3 branches. These delayed arterial phases demonstrated significant retrograde opacification of the right middle cerebral artery distribution in the cortical and subcortical regions aided by collaterals from the pericallosal branches of the right anterior cerebral artery opacifying the right middle cerebral artery distribution. The left common carotid arteriogram again demonstrates prominence of the right external carotid artery and its branches with retrograde opacification of the caval segment of the left internal carotid artery via collaterals from the lacrimal branches of the external carotid artery retrograde filling the internal carotid artery via the ophthalmic artery. Retrograde opacification of the internal  carotid artery to the petrous cavernous junction is also noted without antegrade clearance of contrast. Opacification of the left middle cerebral artery and the left anterior cerebral artery into the capillary and venous phases is present. The left vertebral artery origin demonstrates wide patency. The vessel opacifies to the cranial skull base with wide patency of the left vertebrobasilar junction and the left posterior-inferior cerebellar artery. Opacification of the basilar artery, the posterior cerebral arteries, the superior cerebellar arteries is again noted to  be present. Unopacified blood is seen in the basilar artery from the contralateral more dominant vertebral artery. Again demonstrated is retrograde opacification via the posterior communicating arteries of the anterior cerebral artery distribution bilaterally. Previously mentioned collaterals in the anterior cerebral artery and P3 segments of the posterior cerebral artery are seen on the right side. In view of the extensive collaterals, and the hemo dynamic instability induced by balloon angioplasty of the right internal carotid proximally and significant recoil with multiple balloon angioplasties, further treatment with stent placement in the right internal carotid artery and exposure to dual antiplatelets, and intravenous cangrelor would predispose to intracranial hemorrhage with neurological worsening, and also a chance of reocclusion of the stent. The procedure was therefore terminated. The balloon guide and the 8 French Pinnacle sheath in the right groin were removed with successful placement of an 8 French Angio-Seal closure device for hemostasis. Distal pulses remained Dopplerable bilaterally unchanged. Patient was left intubated because of his neurologic condition, and to protect his airway. He was then transferred to the neuro ICU for post revascularization management. IMPRESSION: Status post endovascular revascularization of occluded right  middle cerebral artery proximally, and of the right insular inferior division, with 3 passes with a Solitaire 4 mm x 40 mm retrieval device, 1 pass with the Solitaire 3 mm x 20 mm retrieval device x1 with constant aspiration device achieving a TICI 2a/TICI 2b revascularization. Extensive robust collaterals from both PCAs in the P3 segments, and the posterior communicating artery retrogradely reconstituting the right middle cerebral artery distribution. PLAN: Follow-up in the clinic 4 weeks post discharge. Electronically Signed   By: Luanne Bras M.D.   On: 03/28/2020 11:25       HISTORY OF PRESENT ILLNESS THELTON GRACA is a 64 y.o. male with a history of COPD who was in his normal state of health this evening.  He went to bed around 10 PM, but did not go to sleep because he was watching TV until close to 11 PM. He woke himself up coughing and then tried to get up out of bed and found that he was weak on the left side.  His wife stated that he was difficult to understand called 911.  EMS arrived and activated code stroke en route to the hospital. He has been coughing some recently, and was diagnosed by televisit with bronchitis. He was LKW at 10:45 PM on 03/27/2020. Modified Rankin Scale: 0. NIHSS: 9. IV tPA was administered.    HOSPITAL COURSE Mr. JOE GEE is a 64 y.o. male with history of COPD presenting with L sided weakness. Received tPA 03/27/2020 at 0151.  Stroke:   R MCA and multiple R and L anterior circulation infarcts s/p tPA and IR w/ partial revascularization with resultant small SAH, most likely secondary to large vessel disease with ? dissection due to coughing episodes, vs. Hypercoagulable state from parotid neoplasm   CT head dense R M1. Subtle asymmetric hypodensity R caudate and lentiform nucleus. ASPECTS 8    CTA head & neck R ICA LVO at bifurcation origin w/ cavernous reconstitution and reocclusion proximal R M1. Good MCA collaterals. L ICA occlusion at bifurcation  origin. Short severe L ICA cavernous stenosis.    Cerebral angio partial revascularization R MCA dominant division w/ TICI2a/TICI2b. S/p angioplasty acute R ICA occlusion w/ reocclusion     Post IR CT mild to mod RT peri-insular and post frontal convexity contrast stain vs SAH   MRI  R MCA frontal lobe infarct w/  possible mild petechial hemorrhage. B ICA siphon high-grade stenosis vs occlusion. R MCA SAH.  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 No source of embolus   LDL 82   HgbA1c 5.6  VTE prophylaxis - SCDs   No antithrombotic prior to admission, now on ASA alone due to small SAH. Plan to repeat CT on 7/30, if SAH resolves or nearly resoved, will consider DAPT for 3 months.   Therapy recommendations:  CIR - consult placed  Disposition:  pending - await for CIR input, if not a candidate will d/c home  Acute Hypoxemic Respiratory Failure Secondary to stroke  Intubated for IR, left intubated for airway protection following  Extubated 7/23  Tolerated well  Parotid mass  Incidental finding  Could be etiology of stroke  Tend to be slow growing  ENT referral at d/c w/ bx after rehab completed  Carotid stenosis   Chronic L ICA occlusion  Acute R ICA occlusion S/p R ICA angioplasty w/ reocclusion, ? Dissection from cough  Long term BP goal 130-150  Blood Pressure Hypotension  Asystole on IR table  BP stable  Off Neo  Long-term BP goal 130-150  NS at 50 cc / hr  Hyperlipidemia  Home meds:  crestor 20 and fish oil  LDL 82, goal < 70  On crestor 20  Continue statin at discharge  Dysphagia  Secondary to stroke  Speech on board  MBS in 7/25  Now cleared for dysphagia 3 with honey thick liquids  NS at 50cc / hr   Other Stroke Risk Factors  Advanced age  Former Cigarette smoker, quit 2 yrs ago  Former smokeless tobacco user  ETOH use,  advised to drink no more than 2 drink(s) a day  Other Active Problems  COPD w/o acute exacerbation   Hypokalemia - 3.1->4.7->3.9->3.2->3.6->3.5   Leukocytosis, WBC 16.3->14.3 ->11.2->10.2->8.2->10.4  R Shoulder pain d/t soft tissue injury. 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 rate 19, height 6' (1.829 m), weight 78.9 kg, SpO2 95 %. General - Well nourished, well developed, in no apparent distress.  Ophthalmologic - fundi not visualized due to noncooperation.  Cardiovascular - Regular rhythm and rate.  Mental Status -  Level of arousal and orientation to time, place, and person were intact. Language including expression, naming, repetition, comprehension was assessed and found intact. Mild dysarthria Fund of Knowledge was assessed and was intact.  Cranial Nerves II - XII - II - Visual field intact OU. III, IV, VI - extraocular movement intact V - Facial sensation intact bilaterally. VII - left upper and lower facial weakness VIII - Hearing & vestibular intact bilaterally. X - Palate elevates symmetrically. XI - Chin turning & shoulder shrug intact bilaterally. XII - Tongue protrusion intact.  Motor Strength - The patient's strength was normal in right upper and lower extremities, however left UE 3+/5 proximal but 4+/5 finger grip with dexterity difficulty and LE 5-/5.  Bulk was normal and fasciculations were absent.   Motor Tone - Muscle tone was assessed at the neck and appendages and was normal.  Reflexes - The patient's reflexes were symmetrical in all extremities and he had no pathological reflexes.  Sensory - Light touch, temperature/pinprick were assessed and were symmetrical.    Coordination -  The patient had normal movements in the right hand with no ataxia or dysmetria. Left FTN mild ataxia  proportion to the weakness. Tremor was absent.  Gait and Station - deferred.  Discharge Diet  Dysphagia 3 honey thick liquids  DISCHARGE PLAN  Disposition:  Transfer to [place] for ongoing PT, OT and ST  aspirin 325 mg daily for secondary stroke prevention   Plan to repeat CT on 7/30, if SAH resolves, will consider DAPT for 3 months.    Recommend ongoing stroke risk factor control by Primary Care Physician at time of discharge from inpatient rehabilitation.  Follow-up PCP Olin Hauser, DO in 2 weeks following discharge from rehab.  Follow-up in [facility] in 4 weeks following discharge from rehab, office to schedule an appointment.   Follow-up with Willaim Rayas [facility]) Estanislado Pandy, MD (Interventional Neuroradiologist) in 4 weeks following discharge from rehab, office to schedule an appointment.   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  Rosalin Hawking, MD PhD Stroke Neurology 04/01/2020 4:41 PM

## 2020-03-31 NOTE — Progress Notes (Signed)
Physical Therapy Treatment Patient Details Name: Ethan Fitzgerald MRN: [id number] DOB: Mar 25, 1956 Today's Date: 03/31/2020    History of Present Illness 64 year old male with COPD, who came with acute right MCA stroke status post TPA and thrombectomy of bilateral ICA and right M1.  MRI of brain showed cortical/subcortical acute Rt MCA infarct centered within the posterolateral Rt frontal lobe, Rt frontal operculum, Rt insula, and subtly involving portions of the superomedial Rt temporal lobe.  Additional small acute infarcts within the Rt caudate nucleus, Rt pre and postcentral cyri and mid to anterior Lt frontal lobe     PT Comments    Patient more fatigued and with more episodes of imbalance and running into objects on his left today. He reported he feels less steady today and requested to use RW after initiating session with no device. Continues to require moderate to max cues to attend to left side of his environment.     Follow Up Recommendations  CIR;Supervision/Assistance - 24 hour     Equipment Recommendations  None recommended by PT    Recommendations for Other Services       Precautions / Restrictions Precautions Precautions: Fall Precaution Comments: impulsive; Lt inattention     Mobility  Bed Mobility Overal bed mobility: Needs Assistance Bed Mobility: Supine to Sit     Supine to sit: Min guard     General bed mobility comments: not attending to his condom catheter as coming to sit  Transfers Overall transfer level: Needs assistance Equipment used: Rolling walker (2 wheeled) Transfers: Sit to/from Stand Sit to Stand: Min guard         General transfer comment: from EOB and from chair with armrests; vc for sequencing with RW (tries to pull up on RW)  Ambulation/Gait Ambulation/Gait assistance: Min assist Gait Distance (Feet): 150 Feet (x2 (with and without RW)) Assistive device: None;Rolling walker (2 wheeled) Gait Pattern/deviations: Step-through  pattern;Decreased stride length;Decreased weight shift to left;Decreased dorsiflexion - left;Drifts right/left Gait velocity: faster than is safe for his inattention to left   General Gait Details: pt stumbled due to dragging left foot x 2 with LOB when not using RW; reports he feels more unsteady today and prefers to use RW; with RW he ran into objects on his left x 2 with imbalance; required repeated cues to look to his left   Stairs   Stairs assistance: Min guard Stair Management: One rail Right;Step to pattern;Forwards   General stair comments: instructed to step up with left leg (weaker) and he could but also used Orthoptist    Modified Rankin (Stroke Patients Only) Modified Rankin (Stroke Patients Only) Pre-Morbid Rankin Score: No symptoms Modified Rankin: Moderately severe disability     Balance Overall balance assessment: Needs assistance Sitting-balance support: Feet supported;No upper extremity supported Sitting balance-Leahy Scale: Fair     Standing balance support: During functional activity;Bilateral upper extremity supported Standing balance-Leahy Scale: Fair Standing balance comment: static standing no UE support; with weight-shifting and turning to look as changing his gowns +imbalance                            Cognition Arousal/Alertness: Awake/alert Behavior During Therapy: Impulsive Overall Cognitive Status: Impaired/Different from baseline Area of Impairment: Attention;Memory;Safety/judgement;Awareness;Problem solving                   Current Attention Level: Sustained Memory: Decreased recall of precautions;Decreased short-term memory Following Commands: Follows one step  commands consistently;Follows multi-step commands inconsistently Safety/Judgement: Decreased awareness of safety;Decreased awareness of deficits Awareness: Emergent Problem Solving: Slow processing;Difficulty sequencing;Requires verbal  cues;Requires tactile cues General Comments: Ran into objects on his left x 2 when using RW and then recognized needed to move to his right to pass by object; continued left inattention      Exercises      General Comments General comments (skin integrity, edema, etc.): Session limited cue to pt's condom catheter partially off and he urinated with urine soaking him, the bed, and the floor. Time spent retrieving clean gown, washcloths, etc and time changing gowns subtracted from active PT time      Pertinent Vitals/Pain Pain Assessment: Faces Faces Pain Scale: Hurts a little bit (at rest) Pain Location: Lt shoulder posterior and anteriorly.  He reports he fell on the shoulder when this stroke occurred  Pain Descriptors / Indicators: Sore Pain Intervention(s): Limited activity within patient's tolerance;Monitored during session    Home Living                      Prior Function            PT Goals (current goals can now be found in the care plan section) Acute Rehab PT Goals Patient Stated Goal: to get better Time For Goal Achievement: 04/13/20 Potential to Achieve Goals: Good Progress towards PT goals: Not progressing toward goals - comment (perhaps fatigue made balance/inattn worse)    Frequency    Min 4X/week      PT Plan Current plan remains appropriate    Co-evaluation              AM-PAC PT "6 Clicks" Mobility   Outcome Measure  Help needed turning from your back to your side while in a flat bed without using bedrails?: A Little Help needed moving from lying on your back to sitting on the side of a flat bed without using bedrails?: A Little Help needed moving to and from a bed to a chair (including a wheelchair)?: A Little Help needed standing up from a chair using your arms (e.g., wheelchair or bedside chair)?: A Little Help needed to walk in hospital room?: A Little Help needed climbing 3-5 steps with a railing? : A Little 6 Click Score: 18     End of Session Equipment Utilized During Treatment: Gait belt Activity Tolerance: Patient limited by fatigue (states very tired today) Patient left: with call bell/phone within reach;in chair;with chair alarm set;Other (comment) (MD present) Nurse Communication: Mobility status PT Visit Diagnosis: Hemiplegia and hemiparesis;Other symptoms and signs involving the nervous system (R29.898);Other abnormalities of gait and mobility (R26.89) Hemiplegia - Right/Left: Left Hemiplegia - dominant/non-dominant: Non-dominant Hemiplegia - caused by: Cerebral infarction     Time: 3818-2993 PT Time Calculation (min) (ACUTE ONLY): 37 min  Charges:  $Gait Training: 8-22 mins $Therapeutic Activity: 8-22 mins                      Arby Barrette, PT Pager [phone]    Rexanne Mano 03/31/2020, 5:26 PM

## 2020-04-01 ENCOUNTER — Inpatient Hospital Stay (HOSPITAL_COMMUNITY)
Admission: RE | Admit: 2020-04-01 | Discharge: 2020-04-09 | DRG: 057 | Disposition: A | Discharge status: Home / Self Care | Payer: Medicare HMO | Source: Intra-hospital | Attending: Physical Medicine & Rehabilitation | Admitting: Physical Medicine & Rehabilitation

## 2020-04-01 ENCOUNTER — Encounter (HOSPITAL_COMMUNITY): Payer: Self-pay | Admitting: Neurology

## 2020-04-01 DIAGNOSIS — Z7952 Long term (current) use of systemic steroids: Secondary | ICD-10-CM | POA: Diagnosis not present

## 2020-04-01 DIAGNOSIS — I609 Nontraumatic subarachnoid hemorrhage, unspecified: Secondary | ICD-10-CM

## 2020-04-01 DIAGNOSIS — I69391 Dysphagia following cerebral infarction: Secondary | ICD-10-CM | POA: Diagnosis not present

## 2020-04-01 DIAGNOSIS — Z79899 Other long term (current) drug therapy: Secondary | ICD-10-CM | POA: Diagnosis not present

## 2020-04-01 DIAGNOSIS — J439 Emphysema, unspecified: Secondary | ICD-10-CM | POA: Diagnosis not present

## 2020-04-01 DIAGNOSIS — Z8616 Personal history of COVID-19: Secondary | ICD-10-CM

## 2020-04-01 DIAGNOSIS — K118 Other diseases of salivary glands: Secondary | ICD-10-CM | POA: Diagnosis present

## 2020-04-01 DIAGNOSIS — I10 Essential (primary) hypertension: Secondary | ICD-10-CM | POA: Diagnosis not present

## 2020-04-01 DIAGNOSIS — J432 Centrilobular emphysema: Secondary | ICD-10-CM | POA: Diagnosis present

## 2020-04-01 DIAGNOSIS — S066X0A Traumatic subarachnoid hemorrhage without loss of consciousness, initial encounter: Secondary | ICD-10-CM | POA: Diagnosis not present

## 2020-04-01 DIAGNOSIS — I959 Hypotension, unspecified: Secondary | ICD-10-CM | POA: Diagnosis present

## 2020-04-01 DIAGNOSIS — M25511 Pain in right shoulder: Secondary | ICD-10-CM

## 2020-04-01 DIAGNOSIS — I63511 Cerebral infarction due to unspecified occlusion or stenosis of right middle cerebral artery: Secondary | ICD-10-CM | POA: Diagnosis not present

## 2020-04-01 DIAGNOSIS — K5900 Constipation, unspecified: Secondary | ICD-10-CM | POA: Diagnosis present

## 2020-04-01 DIAGNOSIS — R1310 Dysphagia, unspecified: Secondary | ICD-10-CM | POA: Diagnosis not present

## 2020-04-01 DIAGNOSIS — E785 Hyperlipidemia, unspecified: Secondary | ICD-10-CM | POA: Diagnosis present

## 2020-04-01 DIAGNOSIS — Z7951 Long term (current) use of inhaled steroids: Secondary | ICD-10-CM

## 2020-04-01 DIAGNOSIS — R7309 Other abnormal glucose: Secondary | ICD-10-CM | POA: Diagnosis present

## 2020-04-01 DIAGNOSIS — Z87891 Personal history of nicotine dependence: Secondary | ICD-10-CM | POA: Diagnosis not present

## 2020-04-01 DIAGNOSIS — I69354 Hemiplegia and hemiparesis following cerebral infarction affecting left non-dominant side: Principal | ICD-10-CM

## 2020-04-01 DIAGNOSIS — E782 Mixed hyperlipidemia: Secondary | ICD-10-CM

## 2020-04-01 DIAGNOSIS — J441 Chronic obstructive pulmonary disease with (acute) exacerbation: Secondary | ICD-10-CM | POA: Diagnosis present

## 2020-04-01 DIAGNOSIS — J449 Chronic obstructive pulmonary disease, unspecified: Secondary | ICD-10-CM | POA: Diagnosis present

## 2020-04-01 DIAGNOSIS — I6601 Occlusion and stenosis of right middle cerebral artery: Secondary | ICD-10-CM

## 2020-04-01 LAB — BASIC METABOLIC PANEL
Anion gap: 8 (ref 5–15)
BUN: 13 mg/dL (ref 8–23)
CO2: 22 mmol/L (ref 22–32)
Calcium: 9 mg/dL (ref 8.9–10.3)
Chloride: 106 mmol/L (ref 98–111)
Creatinine, Ser: 0.95 mg/dL (ref 0.61–1.24)
GFR calc Af Amer: >60 mL/min (ref >60)
GFR calc non Af Amer: >60 mL/min (ref >60)
Glucose, Bld: 94 mg/dL (ref 70–99)
Potassium: 3.5 mmol/L (ref 3.5–5.1)
Sodium: 136 mmol/L (ref 135–145)

## 2020-04-01 LAB — GLUCOSE, CAPILLARY
Glucose-Capillary: 86 mg/dL (ref 70–99)
Glucose-Capillary: 87 mg/dL (ref 70–99)
Glucose-Capillary: 97 mg/dL (ref 70–99)
Glucose-Capillary: 95 mg/dL (ref 70–99)
Glucose-Capillary: 130 mg/dL — ABNORMAL HIGH (ref 70–99)
Glucose-Capillary: 121 mg/dL — ABNORMAL HIGH (ref 70–99)
Glucose-Capillary: 94 mg/dL (ref 70–99)
Glucose-Capillary: 99 mg/dL (ref 70–99)

## 2020-04-01 LAB — CBC
HCT: 40.4 % (ref 39.0–52.0)
Hemoglobin: 14.2 g/dL (ref 13.0–17.0)
MCH: 33 pg (ref 26.0–34.0)
MCHC: 35.1 g/dL (ref 30.0–36.0)
MCV: 94 fL (ref 80.0–100.0)
Platelets: 276 10*3/uL (ref 150–400)
RBC: 4.3 MIL/uL (ref 4.22–5.81)
RDW: 12.4 % (ref 11.5–15.5)
WBC: 10.4 10*3/uL (ref 4.0–10.5)
nRBC: 0 % (ref 0.0–0.2)

## 2020-04-01 MED ORDER — CHLORHEXIDINE GLUCONATE 0.12 % MT SOLN: 15.0000 mL | Freq: Two times a day (BID) | OROMUCOSAL | 0 refills | Status: DC

## 2020-04-01 MED ORDER — PROCHLORPERAZINE 25 MG RE SUPP: 12.5000 mg | Freq: Four times a day (QID) | RECTAL | Status: DC | PRN

## 2020-04-01 MED ORDER — PANTOPRAZOLE SODIUM 40 MG PO TBEC
40.0000 mg | DELAYED_RELEASE_TABLET | Freq: Every day | ORAL | Status: DC
  Administered 2020-04-01 – 2020-04-08 (×8): 40 mg via ORAL
  Filled 2020-04-01 (×8): qty 1

## 2020-04-01 MED ORDER — SODIUM CHLORIDE 0.9 % IV SOLN: 50.0000 mL | INTRAVENOUS | 0 refills | Status: DC

## 2020-04-01 MED ORDER — IPRATROPIUM-ALBUTEROL 0.5-2.5 (3) MG/3ML IN SOLN: 3.0000 mL | Freq: Four times a day (QID) | RESPIRATORY_TRACT | Status: DC | PRN

## 2020-04-01 MED ORDER — GUAIFENESIN-DM 100-10 MG/5ML PO SYRP: 5.0000 mL | ORAL_SOLUTION | Freq: Four times a day (QID) | ORAL | Status: DC | PRN

## 2020-04-01 MED ORDER — ASPIRIN 81 MG PO CHEW: 324.0000 mg | CHEWABLE_TABLET | Freq: Every day | ORAL | Status: DC

## 2020-04-01 MED ORDER — ALUM & MAG HYDROXIDE-SIMETH 200-200-20 MG/5ML PO SUSP: 30.0000 mL | ORAL | Status: DC | PRN

## 2020-04-01 MED ORDER — PROCHLORPERAZINE EDISYLATE 10 MG/2ML IJ SOLN: 5.0000 mg | Freq: Four times a day (QID) | INTRAMUSCULAR | Status: DC | PRN

## 2020-04-01 MED ORDER — DIPHENHYDRAMINE HCL 12.5 MG/5ML PO ELIX: 12.5000 mg | ORAL_SOLUTION | Freq: Four times a day (QID) | ORAL | Status: DC | PRN

## 2020-04-01 MED ORDER — PROCHLORPERAZINE MALEATE 5 MG PO TABS: 5.0000 mg | ORAL_TABLET | Freq: Four times a day (QID) | ORAL | Status: DC | PRN

## 2020-04-01 MED ORDER — SODIUM CHLORIDE 0.9 % IV SOLN: INTRAVENOUS | Status: DC

## 2020-04-01 MED ORDER — DOCUSATE SODIUM 100 MG PO CAPS
100.0000 mg | ORAL_CAPSULE | Freq: Two times a day (BID) | ORAL | Status: DC
  Administered 2020-04-01 – 2020-04-02 (×2): 100 mg via ORAL
  Filled 2020-04-01: qty 1

## 2020-04-01 MED ORDER — ROSUVASTATIN CALCIUM 20 MG PO TABS
20.0000 mg | ORAL_TABLET | Freq: Every day | ORAL | Status: DC
  Administered 2020-04-02 – 2020-04-09 (×8): 20 mg via ORAL
  Filled 2020-04-01 (×8): qty 1

## 2020-04-01 MED ORDER — INSULIN ASPART 100 UNIT/ML ~~LOC~~ SOLN: 0.0000 [IU] | SUBCUTANEOUS | 11 refills | Status: DC

## 2020-04-01 MED ORDER — DICLOFENAC SODIUM 1 % EX GEL
2.0000 g | Freq: Four times a day (QID) | CUTANEOUS | Status: DC
  Administered 2020-04-01 – 2020-04-09 (×22): 2 g via TOPICAL
  Filled 2020-04-01 (×2): qty 100

## 2020-04-01 MED ORDER — BISACODYL 10 MG RE SUPP: 10.0000 mg | Freq: Every day | RECTAL | Status: DC | PRN

## 2020-04-01 MED ORDER — ENOXAPARIN SODIUM 40 MG/0.4ML ~~LOC~~ SOLN
40.0000 mg | SUBCUTANEOUS | Status: DC
  Administered 2020-04-01 – 2020-04-08 (×8): 40 mg via SUBCUTANEOUS
  Filled 2020-04-01 (×8): qty 0.4

## 2020-04-01 MED ORDER — CHLORHEXIDINE GLUCONATE 0.12 % MT SOLN
15.0000 mL | Freq: Two times a day (BID) | OROMUCOSAL | Status: DC
  Administered 2020-04-02 – 2020-04-09 (×15): 15 mL via OROMUCOSAL
  Filled 2020-04-01 (×15): qty 15

## 2020-04-01 MED ORDER — FLEET ENEMA 7-19 GM/118ML RE ENEM: 1.0000 | ENEMA | Freq: Once | RECTAL | Status: DC | PRN

## 2020-04-01 MED ORDER — TRAZODONE HCL 50 MG PO TABS: 25.0000 mg | ORAL_TABLET | Freq: Every evening | ORAL | Status: DC | PRN

## 2020-04-01 MED ORDER — SENNOSIDES-DOCUSATE SODIUM 8.6-50 MG PO TABS
2.0000 | ORAL_TABLET | Freq: Every day | ORAL | Status: DC
  Administered 2020-04-01: 2 via ORAL
  Filled 2020-04-01: qty 2

## 2020-04-01 MED ORDER — ASPIRIN 81 MG PO CHEW
324.0000 mg | CHEWABLE_TABLET | Freq: Every day | ORAL | Status: DC
  Administered 2020-04-02 – 2020-04-07 (×6): 324 mg via ORAL
  Filled 2020-04-01 (×6): qty 4

## 2020-04-01 MED ORDER — NON FORMULARY: 2.5000 ug | Freq: Every day | Status: DC

## 2020-04-01 MED ORDER — ACETAMINOPHEN 325 MG PO TABS: 650.0000 mg | ORAL_TABLET | ORAL | Status: DC | PRN

## 2020-04-01 MED ORDER — RESOURCE THICKENUP CLEAR PO POWD
ORAL | Status: DC | PRN
  Filled 2020-04-01: qty 125

## 2020-04-01 MED ORDER — ACETAMINOPHEN 325 MG PO TABS
325.0000 mg | ORAL_TABLET | ORAL | Status: DC | PRN
  Administered 2020-04-06 – 2020-04-08 (×2): 650 mg via ORAL
  Filled 2020-04-01 (×2): qty 2

## 2020-04-01 MED ORDER — TIOTROPIUM BROMIDE-OLODATEROL 2.5-2.5 MCG/ACT IN AERS
2.5000 ug | INHALATION_SPRAY | Freq: Every day | RESPIRATORY_TRACT | Status: DC
  Administered 2020-04-04: 2.5 ug via RESPIRATORY_TRACT

## 2020-04-01 MED ORDER — POTASSIUM CHLORIDE IN NACL 20-0.9 MEQ/L-% IV SOLN
INTRAVENOUS | Status: DC
  Filled 2020-04-01: qty 1000

## 2020-04-01 MED ORDER — RESOURCE THICKENUP CLEAR PO POWD: 1.0000 | ORAL | Status: DC | PRN

## 2020-04-01 NOTE — TOC Transition Note (Signed)
Transition of Care [facility]) - CM/SW Discharge Note   Patient Details  Name: Ethan Fitzgerald MRN: [id number] Date of Birth: 1955-10-30  [facility] CM/SW Contact:  Pollie Friar, RN Phone Number: 04/01/2020, 4:41 PM   Clinical Narrative:    Pt is discharging to CIR today. CM signing off.    Final next level of care: IP Rehab Facility Barriers to Discharge: No Barriers Identified   Patient Goals and CMS Choice        Discharge Placement                       Discharge Plan and Services                                     Social Determinants of Health (SDOH) Interventions     Readmission Risk Interventions No flowsheet data found.

## 2020-04-01 NOTE — Progress Notes (Signed)
Inpatient Rehabilitation Medication Review by a Pharmacist  A complete drug regimen review was completed for this patient to identify any potential clinically significant medication issues.  Clinically significant medication issues were identified:  no  Pharmacist comments:   Time spent performing this drug regimen review (minutes):  8 minutes   Alycia Rossetti, PharmD, BCPS  9:47 PM

## 2020-04-01 NOTE — Progress Notes (Signed)
Charlett Blake, MD  Physician  Physical Medicine and Rehabilitation  Consult Note     Signed  Date of Service:  03/31/2020  9:23 AM      Related encounter: ED to Hosp-Admission (Current) from 03/27/2020 in [place] [facility]      Signed      Expand All Collapse All  Show:Clear all [x] Manual[x] Template[] Copied  Added by: [x] Kirsteins, Luanna Salk, MD[x] Love, Ivan Anchors, PA-C  [] Hover for details          Physical Medicine and Rehabilitation Consult     Reason for Consult: Stroke with functional deficits.  Referring Physician: Dr. Erlinda Hong     HPI: Ethan Fitzgerald is a 64 y.o. LH-male with history of COPD, recent bronchitis, HA who was admitted on 03/27/20 with left sided weakness and difficulty speaking. CT head showed dense M1 segment with occlusive thrombus, occlusion of L-ICA age indeterminate with distal reconstitution at cavernous segment and good perfusion of L-MCA and B-ACAs, He underwent cerebral angio with partial revascularization of R-MCA dominant division with revascularization and balloon angioplasty of acutely occluded proximal R-ICA with reocclusion. Follow up MRI brain showed acute cortical/subcortical R-MCA infarct, additional small infarcts in right caudate nucleus, right pre and postcentral gyri and mid to anterior left frontal lobe, SAH and incidental noted of right parotid neoplasm. Follow up CT  Head showed unchanged acute R-MCA infarct with stable SAH.  2D echo technically difficult and showed EF 55-60%.    He tolerated extubation on 07/23 and dysphagia 1, honey liquids due to moderate oropharyngeal dysphagia. Dr. Leonie Man questioned that stroke due to dissection of large vessel due to coughing episodes v/s hypercoagulable state from parotid neoplasm--on ASA alone due to small SAH.  Pain left shoulder felt to be due to focal muscle strain from fall prior to stroke. Therapy evaluations completed revealing left sided left inattention, poor safety awareness with  impulsivity affecting mobility and ADLs. CIR recommended due to functional deficits.      Review of Systems  Constitutional: Negative for chills and fever.  HENT: Negative for hearing loss and tinnitus.   Eyes: Negative for blurred vision, double vision, pain and discharge.  Respiratory: Positive for cough. Negative for sputum production and shortness of breath.   Cardiovascular: Negative for chest pain and palpitations.  Gastrointestinal: Positive for heartburn. Negative for constipation.  Genitourinary: Negative for frequency and urgency.  Musculoskeletal: Positive for joint pain (left shoulder) and myalgias.  Skin: Negative for rash.  Neurological: Positive for speech change and focal weakness.  Psychiatric/Behavioral: The patient does not have insomnia.             Past Medical History:  Diagnosis Date  . COPD (chronic obstructive pulmonary disease) (HCC)      mild  . Erectile dysfunction    . Headache      daily, stress headaches  . Knee pain, left    . Seasonal allergies    . Tobacco dependence             Past Surgical History:  Procedure Laterality Date  . BACK SURGERY        metal plate in back  . COLONOSCOPY      . COLONOSCOPY WITH PROPOFOL N/A 04/26/2019    Procedure: COLONOSCOPY WITH PROPOFOL;  Surgeon: Jonathon Bellows, MD;  Location: [facility] ENDOSCOPY;  Service: Gastroenterology;  Laterality: N/A;  . HERNIA REPAIR Right    . IR ANGIO INTRA EXTRACRAN SEL COM CAROTID INNOMINATE UNI L MOD SED   03/27/2020  .  IR ANGIO VERTEBRAL SEL SUBCLAVIAN INNOMINATE UNI L MOD SED   03/27/2020  . IR ANGIO VERTEBRAL SEL VERTEBRAL UNI R MOD SED   03/27/2020  . IR CT HEAD LTD   03/27/2020  . IR PERCUTANEOUS ART THROMBECTOMY/INFUSION INTRACRANIAL INC DIAG ANGIO   03/27/2020  . KNEE ARTHROSCOPY Left 03/28/2015    Procedure: Arthroscopic partial medial meniscectomy plus chondral debridement;  Surgeon: Leanor Kail, MD;  Location: [place];  Service: Orthopedics;  Laterality: Left;    . RADIOLOGY WITH ANESTHESIA N/A 03/27/2020    Procedure: IR WITH ANESTHESIA;  Surgeon: Luanne Bras, MD;  Location: [place];  Service: Radiology;  Laterality: N/A;           Family History  Problem Relation Age of Onset  . Diabetes Mother    . Heart attack Mother 10  . Cancer Father          liver   . COPD Brother    . HIV/AIDS Brother    . Prostate cancer Neg Hx    . Colon cancer Neg Hx        Social History:  Married. He works in [place] for a [employer] He  reports that he quit smoking about 2 years ago. His smoking use included cigarettes. He started smoking about 51 years ago. He has a 73.50 pack-year smoking history. He has quit using smokeless tobacco. He reports current alcohol use--6 pack on the weekend. He reports that he does not use drugs.      Allergies: No Known Allergies      Medications Prior to Admission  Medication Sig Dispense Refill  . albuterol (VENTOLIN HFA) 108 (90 Base) MCG/ACT inhaler Inhale 1-2 puffs into the lungs every 4 (four) hours as needed for wheezing or shortness of breath. 18 g 2  . Aspirin-Acetaminophen-Caffeine (EXCEDRIN PO) Take 2 tablets by mouth. am      . guaiFENesin-codeine (ROBITUSSIN AC) 100-10 MG/5ML syrup Take 5-10 mLs by mouth 3 (three) times daily as needed for cough. 180 mL 0  . Omega-3 Fatty Acids (FISH OIL) 1000 MG CAPS Take 2,000 mg by mouth in the morning and at bedtime.      . predniSONE (DELTASONE) 50 MG tablet Take 1 tablet (50 mg total) by mouth daily with breakfast. 5 tablet 0  . rosuvastatin (CRESTOR) 10 MG tablet Take 2 tablets (20 mg total) by mouth daily. 90 tablet 1  . Tiotropium Bromide-Olodaterol (STIOLTO RESPIMAT) 2.5-2.5 MCG/ACT AERS Inhale 2 puffs into the lungs daily. 4 g 6  . vitamin C (ASCORBIC ACID) 500 MG tablet Take 500 mg by mouth daily. Am      . azithromycin (ZITHROMAX Z-PAK) 250 MG tablet Take 2 tabs (500mg  total) on Day 1. Take 1 tab (250mg ) daily for next 4 days. (Patient not taking:  Reported on 03/27/2020) 6 tablet 0  . cyclobenzaprine (FLEXERIL) 10 MG tablet Take 1 tablet (10 mg total) by mouth 3 (three) times daily as needed for muscle spasms. (Patient not taking: Reported on 03/27/2020) 60 tablet 1  . naproxen (NAPROSYN) 500 MG tablet TAKE 1 TABLET (500 MG TOTAL) BY MOUTH 2 (TWO) TIMES DAILY WITH A MEAL. FOR 2-4 WEEKS THEN AS NEEDED (Patient not taking: Reported on 03/27/2020) 60 tablet 2  . sildenafil (REVATIO) 20 MG tablet TAKE 1 TO 5 TABLETS BY MOUTH ABOUT 30 MINUTES PRIOR TO SEX.START WITH 1 THEN INCREASE (Patient not taking: Reported on 03/27/2020) 30 tablet 5      Home: Home Living  Family/patient expects to be discharged to:: Private residence Living Arrangements: Spouse/significant other Available Help at Discharge: Family Type of Home: House Home Access: Stairs to enter Technical brewer of Steps: [place]: One level Bathroom Shower/Tub: Multimedia programmer: Handicapped height Home Equipment: Civil engineer, contracting Additional Comments: Pt lives with spouse, who is supportive and who is retired   Lives With: Spouse  Functional History: Prior Function Level of Independence: Independent Comments: 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 On return to supine, pt's left arm hanging off side of bed and required cues to bring it up onto mattress. Transfers Overall transfer level: Needs assistance Equipment used: None Transfers: Sit to/from Stand Sit to Stand: Min guard Stand pivot  transfers: Min assist General transfer comment: from EOB; twice he stood and immediately dropped washcloth he was holding in left hand (to take to wipe his mouth). He then had to sit down to reach and pick up the dropped cloth; and repeated the same again.3rd attempt he kept washcloth in his rt hand Ambulation/Gait Ambulation/Gait assistance: Min assist Gait Distance (Feet): 100 Feet (x 4; required cues to scan environment on his left ) Assistive device: None Gait Pattern/deviations: Step-through pattern, Decreased stride length, Decreased weight shift to left, Decreased dorsiflexion - left, Drifts right/left General Gait Details: as he fatigues, his left foot slides/drags along the floor (wearing hospital slipper socks); anticipate in shoes he would catch his left foot and trip; drifts to right when looking rt and drifts left with looking left Gait velocity: faster than is safe for his inattention to left Gait velocity interpretation: <1.8 ft/sec, indicate of risk for recurrent falls Stairs: Yes Stairs assistance: Min guard Stair Management: One rail Right, Step to pattern, Forwards Number of Stairs: 2 General stair comments: instructed to step up with left leg (weaker) and he could but also used rail   ADL: ADL Overall ADL's : Needs assistance/impaired Eating/Feeding: Minimal assistance, Sitting Eating/Feeding Details (indicate cue type and reason): requires verbal cues for precautions, assist for set up, and assist for organization and problem solving  Upper Body Bathing: Moderate assistance, Sitting Lower Body Bathing: Minimal assistance, Sit to/from stand Upper Body Dressing : Moderate assistance, Sitting Lower Body Dressing: Moderate assistance, Sit to/from stand Toilet Transfer: Minimal assistance, Ambulation, Comfort height toilet, Grab bars, RW Toileting- Clothing Manipulation and Hygiene: Moderate assistance, Sit to/from stand Toileting - Clothing Manipulation Details (indicate  cue type and reason): Per nsg staff, pt has been incontinent of stool multiple times  Functional mobility during ADLs: Minimal assistance   Cognition: Cognition Overall Cognitive Status: Impaired/Different from baseline Arousal/Alertness: Lethargic Orientation Level: Oriented X4 Attention: Focused, Sustained Focused Attention: Appears intact Sustained Attention: Appears intact Immediate Memory Recall: Sheila Oats, Bed Memory Recall Sock: Without Cue Memory Recall Blue: Without Cue Memory Recall Bed: Not able to recall Awareness: Appears intact Problem Solving: Appears intact Safety/Judgment: Appears intact Cognition Arousal/Alertness: Awake/alert Behavior During Therapy: Impulsive Overall Cognitive Status: Impaired/Different from baseline Area of Impairment: Attention, Memory, Safety/judgement, Awareness, Problem solving Current Attention Level: Sustained Memory:  Decreased recall of precautions, Decreased short-term memory Following Commands: Follows one step commands consistently, Follows multi-step commands inconsistently Safety/Judgement: Decreased awareness of safety, Decreased awareness of deficits Awareness: Emergent Problem Solving: Slow processing, Difficulty sequencing, Requires verbal cues, Requires tactile cues General Comments: Patient very impulsive--moving without regard to his condom catheter or IV in RUE. He could not recall that he was to drink honey-thick liquids (despite RN stating at least 3 people have discussed with him)     Blood pressure (!) 151/96, pulse 77, temperature 98.5 F (36.9 C), temperature source Oral, resp. rate 18, height 6' (1.829 m), weight 78.9 kg, SpO2 97 %. Physical Exam Vitals and nursing note reviewed.  Constitutional:      Appearance: Normal appearance.  HENT:     Head: Normocephalic and atraumatic.     Mouth/Throat:     Mouth: Mucous membranes are dry.  Cardiovascular:     Rate and Rhythm: Normal rate and regular rhythm.      Heart sounds: Normal heart sounds. No murmur heard.   Pulmonary:     Effort: Pulmonary effort is normal.     Breath sounds: Wheezing and rhonchi present.  Abdominal:     General: Abdomen is flat. Bowel sounds are normal. There is no distension.     Palpations: Abdomen is soft. There is no mass.  Musculoskeletal:     Comments: Left shoulder with AC tenderness.   Neurological:     Mental Status: He is alert and oriented to person, place, and time.     Comments: Left facial weakness with mild to moderate dysarthria depending on rate of speech. Mild left inattention but able to look to left field without cues. Left sided weakness.   5/5 right deltoid bicep tricep grip hip flexor knee extensor ankle dorsiflexor 3 - left deltoid bicep tricep finger flexors and extensors 4 - at the left hip flexor, 4/5 in the left quad, for management left ankle dorsiflexor and plantar flexor Sensation equal to light touch bilateral upper and lower limbs There is no evidence of left neglect or field cut on confrontation testing  Psychiatric:        Mood and Affect: Mood normal.        Behavior: Behavior normal.    Pain at 90 degrees of abduction of left shoulder no pain with external rotation there is pain with internal rotation Lab Results Last 24 Hours       Results for orders placed or performed during the hospital encounter of 03/27/20 (from the past 24 hour(s))  Glucose, capillary     Status: None    Collection Time: 03/30/20 12:30 PM  Result Value Ref Range    Glucose-Capillary 92 70 - 99 mg/dL  Glucose, capillary     Status: None    Collection Time: 03/30/20  5:24 PM  Result Value Ref Range    Glucose-Capillary 94 70 - 99 mg/dL  Glucose, capillary     Status: None    Collection Time: 03/30/20  8:13 PM  Result Value Ref Range    Glucose-Capillary 92 70 - 99 mg/dL  Glucose, capillary     Status: None    Collection Time: 03/30/20 11:22 PM  Result Value Ref Range    Glucose-Capillary 94 70 - 99  mg/dL  Glucose, capillary     Status: None    Collection Time: 03/31/20  3:07 AM  Result Value Ref Range    Glucose-Capillary 82 70 - 99 mg/dL  CBC  Status: Abnormal    Collection Time: 03/31/20  3:25 AM  Result Value Ref Range    WBC 8.2 4.0 - 10.5 K/uL    RBC 4.04 (L) 4.22 - 5.81 MIL/uL    Hemoglobin 13.2 13.0 - 17.0 g/dL    HCT 38.1 (L) 39 - 52 %    MCV 94.3 80.0 - 100.0 fL    MCH 32.7 26.0 - 34.0 pg    MCHC 34.6 30.0 - 36.0 g/dL    RDW 12.5 11.5 - 15.5 %    Platelets 239 150 - 400 K/uL    nRBC 0.0 0.0 - 0.2 %  Basic metabolic panel     Status: Abnormal    Collection Time: 03/31/20  3:25 AM  Result Value Ref Range    Sodium 135 135 - 145 mmol/L    Potassium 3.6 3.5 - 5.1 mmol/L    Chloride 107 98 - 111 mmol/L    CO2 21 (L) 22 - 32 mmol/L    Glucose, Bld 99 70 - 99 mg/dL    BUN 13 8 - 23 mg/dL    Creatinine, Ser 0.83 0.61 - 1.24 mg/dL    Calcium 8.8 (L) 8.9 - 10.3 mg/dL    GFR calc non Af Amer >60 >60 mL/min    GFR calc Af Amer >60 >60 mL/min    Anion gap 7 5 - 15  Glucose, capillary     Status: Abnormal    Collection Time: 03/31/20  7:47 AM  Result Value Ref Range    Glucose-Capillary 108 (H) 70 - 99 mg/dL       Imaging Results (Last 48 hours)  DG Swallowing Func-Speech Pathology   Result Date: 03/30/2020 Objective Swallowing Evaluation: Type of Study: MBS-Modified Barium Swallow Study  Patient Details Name: Ethan Fitzgerald MRN: [id number] Date of Birth: 1956/07/20 Today's Date: 03/30/2020 Time: SLP Start Time (ACUTE ONLY): 1145 -SLP Stop Time (ACUTE ONLY): 1210 SLP Time Calculation (min) (ACUTE ONLY): 25 min Past Medical History: Past Medical History: Diagnosis Date . COPD (chronic obstructive pulmonary disease) (HCC)   mild . Erectile dysfunction  . Headache   daily, stress headaches . Knee pain, left  . Seasonal allergies  . Tobacco dependence  Past Surgical History: Past Surgical History: Procedure Laterality Date . BACK SURGERY    metal plate in back . COLONOSCOPY   .  COLONOSCOPY WITH PROPOFOL N/A 04/26/2019  Procedure: COLONOSCOPY WITH PROPOFOL;  Surgeon: Jonathon Bellows, MD;  Location: [facility] ENDOSCOPY;  Service: Gastroenterology;  Laterality: N/A; . HERNIA REPAIR Right  . KNEE ARTHROSCOPY Left 03/28/2015  Procedure: Arthroscopic partial medial meniscectomy plus chondral debridement;  Surgeon: Leanor Kail, MD;  Location: [place];  Service: Orthopedics;  Laterality: Left; . RADIOLOGY WITH ANESTHESIA N/A 03/27/2020  Procedure: IR WITH ANESTHESIA;  Surgeon: Luanne Bras, MD;  Location: [place];  Service: Radiology;  Laterality: N/A; HPI: 64 y.o. male with history of COPD presenting with L sided weakness.  R MCA infarct s/p tPA and IR with partial revascularization. CT head 7/23: moderate-sized acute right MCA infarct centered in right frontal operculum; Hyperdensity within regional sulci, right sylvian fissure, and interpeduncular cistern may reflect a combination of subarachnoid hemorrhage and contrast.   Subjective: Pt was alert and cooperative Assessment / Plan / Recommendation CHL IP CLINICAL IMPRESSIONS 03/30/2020 Clinical Impression Pt was seen for a modified barium swallow study and he presents with moderate oropharyngeal dysphagia with resultant silent aspiration of thin liquid (PAS 8) and sensed aspiration of nectar-thick liquid (PAS  6).  Aspiration appeared to be secondary to delayed swallow initiation at the pyriform sinus and delayed laryngeal closure.  No laryngeal penetration or aspiration was observed with honey-thick liquid, puree, or regular solids.  Oral phase was remarkable for anterior spillage on the L side with thin liquid trials, reduced mastication of regular solids (at least partially contributable to edentulism), reduced lingual control resulting in premature spillage to the pharynx, and reduced lingual strength resulting in oral residue.  Pharyngeal phase was remarkable for reduced BOT retraction resulting in vallecular residue, reduced  hyolaryngeal elevation/excursion resulting in vallecular and pyriform residue, and reduced epiglottic inversion with all liquid trials resulting in vallecular residue.  Pt was cued to take effortful dry swallows after trials in an attempt to reduce pharyngeal residue, but this was only partially successful.  Pharyngoesophageal phase was remarkable for suspected CP bar around the level of C5-C6 and suspected mild osteophytes at C5 and C6.  No esophageal backflow was observed.  Recommend initiation of Dysphagia 1 (puree) solids and honey-thick liquids (no straws) with medications administered crushed in puree.  Pt will benefit from set up and intermittent supervision to cue for the following compensatory strategies: 1) Small bites/sips 2) Effortful dry swallow following each bite/sip 3) Slow rate of intake 4) Sit upright as possible.   SLP Visit Diagnosis Dysphagia, oropharyngeal phase (R13.12) Attention and concentration deficit following -- Frontal lobe and executive function deficit following -- Impact on safety and function Moderate aspiration risk   CHL IP TREATMENT RECOMMENDATION 03/30/2020 Treatment Recommendations Therapy as outlined in treatment plan below   Prognosis 03/30/2020 Prognosis for Safe Diet Advancement Good Barriers to Reach Goals -- Barriers/Prognosis Comment -- CHL IP DIET RECOMMENDATION 03/30/2020 SLP Diet Recommendations Dysphagia 1 (Puree) solids;Honey thick liquids Liquid Administration via Cup Medication Administration Crushed with puree Compensations Slow rate;Small sips/bites;Effortful swallow;Multiple dry swallows after each bite/sip Postural Changes Seated upright at 90 degrees   CHL IP OTHER RECOMMENDATIONS 03/30/2020 Recommended Consults -- Oral Care Recommendations Oral care BID Other Recommendations Order thickener from pharmacy;Remove water pitcher;Have oral suction available   CHL IP FOLLOW UP RECOMMENDATIONS 03/30/2020 Follow up Recommendations Inpatient Rehab   CHL IP FREQUENCY AND  DURATION 03/30/2020 Speech Therapy Frequency (ACUTE ONLY) min 2x/week Treatment Duration 2 weeks      CHL IP ORAL PHASE 03/30/2020 Oral Phase Impaired Oral - Pudding Teaspoon -- Oral - Pudding Cup -- Oral - Honey Teaspoon -- Oral - Honey Cup Delayed oral transit;Lingual/palatal residue Oral - Nectar Teaspoon -- Oral - Nectar Cup Delayed oral transit;Lingual/palatal residue;Premature spillage Oral - Nectar Straw Premature spillage;Delayed oral transit;Lingual/palatal residue Oral - Thin Teaspoon Left anterior bolus loss;Lingual/palatal residue;Delayed oral transit;Decreased bolus cohesion;Premature spillage Oral - Thin Cup Left anterior bolus loss;Premature spillage;Delayed oral transit;Lingual/palatal residue Oral - Thin Straw Left anterior bolus loss;Premature spillage;Delayed oral transit;Lingual/palatal residue Oral - Puree Weak lingual manipulation;Lingual/palatal residue;Delayed oral transit Oral - Mech Soft -- Oral - Regular Impaired mastication;Weak lingual manipulation;Lingual/palatal residue;Delayed oral transit Oral - Multi-Consistency -- Oral - Pill -- Oral Phase - Comment --  CHL IP PHARYNGEAL PHASE 03/30/2020 Pharyngeal Phase Impaired Pharyngeal- Pudding Teaspoon -- Pharyngeal -- Pharyngeal- Pudding Cup -- Pharyngeal -- Pharyngeal- Honey Teaspoon -- Pharyngeal -- Pharyngeal- Honey Cup Delayed swallow initiation-vallecula;Pharyngeal residue - valleculae;Pharyngeal residue - pyriform;Pharyngeal residue - posterior pharnyx;Reduced pharyngeal peristalsis;Reduced laryngeal elevation;Reduced airway/laryngeal closure;Reduced epiglottic inversion;Reduced tongue base retraction Pharyngeal Material does not enter airway Pharyngeal- Nectar Teaspoon -- Pharyngeal -- Pharyngeal- Nectar Cup Delayed swallow initiation-vallecula;Penetration/Aspiration during swallow;Reduced anterior laryngeal mobility;Reduced laryngeal elevation;Reduced  airway/laryngeal closure;Reduced tongue base retraction;Reduced epiglottic  inversion;Pharyngeal residue - valleculae;Pharyngeal residue - pyriform Pharyngeal Material enters airway, remains ABOVE vocal cords and not ejected out Pharyngeal- Nectar Straw Delayed swallow initiation-pyriform sinuses;Pharyngeal residue - valleculae;Penetration/Aspiration during swallow;Reduced epiglottic inversion;Reduced anterior laryngeal mobility;Reduced laryngeal elevation;Reduced airway/laryngeal closure;Reduced tongue base retraction;Trace aspiration Pharyngeal Material enters airway, passes BELOW cords then ejected out Pharyngeal- Thin Teaspoon Delayed swallow initiation-pyriform sinuses;Penetration/Aspiration before swallow;Penetration/Aspiration during swallow;Reduced epiglottic inversion;Reduced anterior laryngeal mobility;Reduced laryngeal elevation;Reduced airway/laryngeal closure;Reduced tongue base retraction;Trace aspiration;Pharyngeal residue - valleculae Pharyngeal Material enters airway, passes BELOW cords without attempt by patient to eject out (silent aspiration) Pharyngeal- Thin Cup Delayed swallow initiation-pyriform sinuses;Penetration/Aspiration before swallow;Penetration/Apiration after swallow;Reduced epiglottic inversion;Reduced anterior laryngeal mobility;Reduced laryngeal elevation;Reduced airway/laryngeal closure;Reduced tongue base retraction Pharyngeal Material enters airway, passes BELOW cords and not ejected out despite cough attempt by patient Pharyngeal- Thin Straw Delayed swallow initiation-pyriform sinuses;Penetration/Aspiration during swallow;Pharyngeal residue - valleculae;Reduced epiglottic inversion;Reduced anterior laryngeal mobility;Reduced laryngeal elevation;Reduced airway/laryngeal closure;Reduced tongue base retraction Pharyngeal Material enters airway, remains ABOVE vocal cords and not ejected out Pharyngeal- Puree Delayed swallow initiation-vallecula;Pharyngeal residue - valleculae;Pharyngeal residue - pyriform;Reduced anterior laryngeal mobility;Reduced tongue  base retraction Pharyngeal Material does not enter airway Pharyngeal- Mechanical Soft -- Pharyngeal -- Pharyngeal- Regular Delayed swallow initiation-vallecula;Reduced anterior laryngeal mobility;Pharyngeal residue - valleculae;Pharyngeal residue - pyriform;Pharyngeal residue - posterior pharnyx Pharyngeal Material does not enter airway Pharyngeal- Multi-consistency -- Pharyngeal -- Pharyngeal- Pill -- Pharyngeal -- Pharyngeal Comment --  CHL IP CERVICAL ESOPHAGEAL PHASE 03/30/2020 Cervical Esophageal Phase WFL Pudding Teaspoon -- Pudding Cup -- Honey Teaspoon -- Honey Cup -- Nectar Teaspoon -- Nectar Cup -- Nectar Straw -- Thin Teaspoon -- Thin Cup -- Thin Straw -- Puree -- Mechanical Soft -- Regular -- Multi-consistency -- Pill -- Cervical Esophageal Comment -- Ethan Fitzgerald M.S., CCC-SLP Acute Rehabilitation Services Office: [phone] [place] 03/30/2020, 1:17 PM                    Assessment/Plan: Diagnosis: 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 Does the need for close, 24 hr/day medical supervision in concert with the patient's rehab needs make it unreasonable for this patient to be served in a less intensive setting? Yes 2. 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 Due to bladder management, bowel management, safety, skin/wound care, disease management, medication administration, pain management and patient education, does the patient require 24 hr/day rehab nursing? Yes 4. Does the patient require coordinated care of a physician, rehab nurse, therapy disciplines of PT OT and speech to address physical and functional deficits in the context of the above medical diagnosis(es)? Yes Addressing deficits in the following areas: balance, endurance, locomotion, strength, transferring, bowel/bladder control, bathing, dressing, feeding, grooming,  toileting, cognition, speech, swallowing and psychosocial support 5. Can the patient actively participate in an intensive therapy program of at least 3 hrs of therapy per day at least 5 days per week? Yes 6. The potential for patient to make measurable gains while on inpatient rehab is excellent 7. Anticipated functional outcomes upon discharge from inpatient rehab are modified independent  with PT, modified independent with OT, modified independent with SLP. 8. Estimated rehab length of stay to reach the above functional goals is: 7 to 10 days 9. Anticipated discharge destination: Home 10. Overall Rehab/Functional Prognosis: excellent   RECOMMENDATIONS: This patient's condition is appropriate for continued rehabilitative care in the following setting: CIR Patient has agreed to participate  in recommended program. Yes Note that insurance prior authorization may be required for reimbursement for recommended care.   Comment: We will need oncology follow-up as an outpatient, will also need MRI left shoulder suspect rotator cuff tear although at this point nonoperative care be needed given recent stroke     Bary Leriche, PA-C 03/31/2020  "I have personally performed a face to face diagnostic evaluation of this patient.  Additionally, I have reviewed and concur with the physician assistant's documentation above." Charlett Blake M.D. [facility] FAAPM&R (Neuromuscular Med) Diplomate Am Board of Electrodiagnostic Med Fellow Am Board of Interventional Pain            Revision History                     Routing History                Note Details  Author Charlett Blake, MD File Time 03/31/2020  3:59 PM  Author Type Physician Status Signed  Last Editor Charlett Blake, MD Service Physical Medicine and Rehabilitation

## 2020-04-01 NOTE — Progress Notes (Signed)
Physical Therapy Treatment Patient Details Name: Ethan Fitzgerald MRN: [id number] DOB: 04/19/1956 Today's Date: 04/01/2020    History of Present Illness 64 year old male with COPD, who came with acute right MCA stroke status post TPA and thrombectomy of bilateral ICA and right M1.  MRI of brain showed cortical/subcortical acute Rt MCA infarct centered within the posterolateral Rt frontal lobe, Rt frontal operculum, Rt insula, and subtly involving portions of the superomedial Rt temporal lobe.  Additional small acute infarcts within the Rt caudate nucleus, Rt pre and postcentral cyri and mid to anterior Lt frontal lobe     PT Comments    Patient seen later in afternoon and reports he is again very fatigued. Wife reports she has been working to keep him awake. In some ways his balance/safety were improved (fewer episodes of running into objects), however new issue of forgetting to grasp RW with left hand on 3 of 3 transfers and required cues to correct. Standing at sink exercises for LE strength and balance. Reaching for items up high and down low. Patient eager to get better.    Follow Up Recommendations  CIR;Supervision/Assistance - 24 hour     Equipment Recommendations  None recommended by PT    Recommendations for Other Services       Precautions / Restrictions Precautions Precautions: Fall Precaution Comments: impulsive; Lt inattention     Mobility  Bed Mobility Overal bed mobility: Needs Assistance Bed Mobility: Sit to Supine       Sit to supine: Supervision   General bed mobility comments: entered bed from right side, therefore left arm staying on bed not an issue today  Transfers Overall transfer level: Needs assistance Equipment used: Rolling walker (2 wheeled) Transfers: Sit to/from Stand Sit to Stand: Min guard         General transfer comment: from EOB and from chair with armrests; vc for sequencing with RW (tries to pull up on RW); x 3 neglected to put left  hand on the walker grip before starting to walk  Ambulation/Gait Ambulation/Gait assistance: Min assist Gait Distance (Feet): 100 Feet Assistive device: Rolling walker (2 wheeled) Gait Pattern/deviations: Step-through pattern;Decreased stride length;Decreased weight shift to left;Decreased dorsiflexion - left;Drifts right/left;Trunk flexed Gait velocity: faster than is safe for his inattention to left   General Gait Details: vc for upright posture and proximity to RW, when remains upright he does a better job of clearing left foot; excessively wide turn to his left x 1 (states he thought he had to go that far to avoid a person on his left (poorly judged distance and almost ran into wall on his right)   Stairs   Stairs assistance: Min guard Stair Management: One rail Right;Step to pattern;Forwards Number of Stairs: 2 General stair comments: instructed to step up with left leg (weaker) for strengthening; pt also able to lower using left leg   Wheelchair Mobility    Modified Rankin (Stroke Patients Only) Modified Rankin (Stroke Patients Only) Pre-Morbid Rankin Score: No symptoms Modified Rankin: Moderately severe disability     Balance Overall balance assessment: Needs assistance Sitting-balance support: Feet supported;No upper extremity supported Sitting balance-Leahy Scale: Fair     Standing balance support: During functional activity;Bilateral upper extremity supported Standing balance-Leahy Scale: Fair Standing balance comment: static standing no UE support; with weight-shifting and turning to look as changing his gowns +imbalance  Cognition Arousal/Alertness: Awake/alert Behavior During Therapy: Impulsive Overall Cognitive Status: Impaired/Different from baseline Area of Impairment: Attention;Safety/judgement;Awareness;Problem solving                   Current Attention Level: Sustained     Safety/Judgement: Decreased  awareness of safety;Decreased awareness of deficits Awareness: Emergent Problem Solving: Slow processing;Difficulty sequencing;Requires verbal cues;Requires tactile cues General Comments: forgets to check his left hand as he comes to stand with RW and begins walking without left hand on RW (required cues x3 transfers); ran into object on left x 1      Exercises General Exercises - Lower Extremity Hip ABduction/ADduction: AAROM;Both;10 reps;Standing (progressed bimanual support to single hand to one finger) Toe Raises: AAROM;Both;10 reps;Standing Heel Raises: AAROM;Both;10 reps;Standing Other Exercises Other Exercises: SLS on each leg with single UE support--could hold up to 12 seconds    General Comments General comments (skin integrity, edema, etc.): Wife present      Pertinent Vitals/Pain Pain Assessment: Faces Faces Pain Scale: No hurt    Home Living                      Prior Function            PT Goals (current goals can now be found in the care plan section) Acute Rehab PT Goals Patient Stated Goal: to get better Time For Goal Achievement: 04/13/20 Potential to Achieve Goals: Good Progress towards PT goals: Progressing toward goals (3 of 4 goals met; will set new goals if no CIR 7/28)    Frequency    Min 4X/week      PT Plan Current plan remains appropriate    Co-evaluation              AM-PAC PT "6 Clicks" Mobility   Outcome Measure  Help needed turning from your back to your side while in a flat bed without using bedrails?: A Little Help needed moving from lying on your back to sitting on the side of a flat bed without using bedrails?: A Little Help needed moving to and from a bed to a chair (including a wheelchair)?: A Little Help needed standing up from a chair using your arms (e.g., wheelchair or bedside chair)?: A Little Help needed to walk in hospital room?: A Little Help needed climbing 3-5 steps with a railing? : A Little 6 Click  Score: 18    End of Session Equipment Utilized During Treatment: Gait belt Activity Tolerance: Patient limited by fatigue (states very tired today) Patient left: with call bell/phone within reach;in bed;with bed alarm set;with family/visitor present Nurse Communication: Mobility status PT Visit Diagnosis: Hemiplegia and hemiparesis;Other symptoms and signs involving the nervous system (R29.898);Other abnormalities of gait and mobility (R26.89) Hemiplegia - Right/Left: Left Hemiplegia - dominant/non-dominant: Non-dominant Hemiplegia - caused by: Cerebral infarction     Time: 9357-0177 PT Time Calculation (min) (ACUTE ONLY): 23 min  Charges:  $Gait Training: 8-22 mins $Therapeutic Activity: 8-22 mins                     04/01/2020  Arby Barrette, PT Pager [phone]    Rexanne Mano 04/01/2020, 3:41 PM

## 2020-04-01 NOTE — Progress Notes (Signed)
STROKE TEAM PROGRESS NOTE   INTERVAL HISTORY  Pt wife is at bedside. Pt neuro stable, left shoulder pain improving. X-ray on the left shoulder neg. Pending CIR.  Vitals:   03/31/20 2320 04/01/20 0412 04/01/20 0759 04/01/20 1114  BP: (!) 148/102 (!) 132/86 (!) 117/89 (!) 114/87  Pulse: 69 58 78 67  Resp: 18 19 18 20   Temp: 98.2 F (36.8 C) 98 F (36.7 C) 98.2 F (36.8 C) 98.4 F (36.9 C)  TempSrc: Oral Oral Oral Oral  SpO2: 94% 96% 97% 96%  Weight:      Height:       CBC:  Recent Labs  Lab 03/27/20 0130 03/27/20 0143 03/28/20 0820 03/29/20 0641 03/31/20 0325 04/01/20 0342  WBC 9.0   < > 14.3*   < > 8.2 10.4  NEUTROABS 6.0  --  9.6*  --   --   --   HGB 13.2   < > 13.2   < > 13.2 14.2  HCT 39.9   < > 40.8   < > 38.1* 40.4  MCV 98.5   < > 101.0*   < > 94.3 94.0  PLT 245   < > 248   < > 239 276   < > = values in this interval not displayed.   Basic Metabolic Panel:  Recent Labs  Lab 03/31/20 0325 04/01/20 0342  NA 135 136  K 3.6 3.5  CL 107 106  CO2 21* 22  GLUCOSE 99 94  BUN 13 13  CREATININE 0.83 0.95  CALCIUM 8.8* 9.0   Lipid Panel:  Recent Labs  Lab 03/27/20 0823  CHOL 153  TRIG 115  HDL 48  CHOLHDL 3.2  VLDL 23  LDLCALC 82   HgbA1c:  Recent Labs  Lab 03/27/20 0823  HGBA1C 5.7*   Urine Drug Screen:  Recent Labs  Lab 03/29/20 1302  LABOPIA NONE DETECTED  COCAINSCRNUR NONE DETECTED  LABBENZ NONE DETECTED  AMPHETMU NONE DETECTED  THCU NONE DETECTED  LABBARB NONE DETECTED    Alcohol Level  Recent Labs  Lab 03/27/20 0130  ETH <10    IMAGING past 24 hours DG Shoulder Left Port  Result Date: 03/31/2020 CLINICAL DATA:  Shoulder pain after fall EXAM: LEFT SHOULDER COMPARISON:  None. FINDINGS: There is no evidence of fracture or dislocation. There is no evidence of arthropathy or other focal bone abnormality. Soft tissues are unremarkable. IMPRESSION: Negative. Electronically Signed   By: Donavan Foil M.D.   On: 03/31/2020 19:42     PHYSICAL EXAM    Temp:  [98 F (36.7 C)-99.4 F (37.4 C)] 98.4 F (36.9 C) (07/27 1114) Pulse Rate:  [58-78] 67 (07/27 1114) Resp:  [18-20] 20 (07/27 1114) BP: (114-151)/(86-102) 114/87 (07/27 1114) SpO2:  [94 %-99 %] 96 % (07/27 1114)  General - Well nourished, well developed, in no apparent distress.  Ophthalmologic - fundi not visualized due to noncooperation.  Cardiovascular - Regular rhythm and rate.  Mental Status -  Level of arousal and orientation to time, place, and person were intact. Language including expression, naming, repetition, comprehension was assessed and found intact. Mild dysarthria Fund of Knowledge was assessed and was intact.  Cranial Nerves II - XII - II - Visual field intact OU. III, IV, VI - extraocular movement intact V - Facial sensation intact bilaterally. VII - left upper and lower facial weakness VIII - Hearing & vestibular intact bilaterally. X - Palate elevates symmetrically. XI - Chin turning & shoulder shrug intact bilaterally.  XII - Tongue protrusion intact.  Motor Strength - The patient's strength was normal in right upper and lower extremities, however left UE 3+/5 proximal but 4+/5 finger grip with dexterity difficulty and LE 5-/5.  Bulk was normal and fasciculations were absent.   Motor Tone - Muscle tone was assessed at the neck and appendages and was normal.  Reflexes - The patient's reflexes were symmetrical in all extremities and he had no pathological reflexes.  Sensory - Light touch, temperature/pinprick were assessed and were symmetrical.    Coordination - The patient had normal movements in the right hand with no ataxia or dysmetria. Left FTN mild ataxia proportion to the weakness. Tremor was absent.  Gait and Station - deferred.   ASSESSMENT/PLAN Ethan Fitzgerald is a 64 y.o. male with history of COPD presenting with L sided weakness. Received tPA 03/27/2020 at 0151.  Stroke:   R MCA and multiple R and L anterior  circulation infarcts s/p tPA and IR w/ partial revascularization, most likely secondary to large vessel disease with ? dissection due to coughing episodes, vs. Hypercoagulable state from parotid neoplasm   CT head dense R M1. Subtle asymmetric hypodensity R caudate and lentiform nucleus. ASPECTS 8    CTA head & neck R ICA LVO at bifurcation origin w/ cavernous reconstitution and reocclusion proximal R M1. Good MCA collaterals. L ICA occlusion at bifurcation origin. Short severe L ICA cavernous stenosis.    Cerebral angio partial revascularization R MCA dominant division w/ TICI2a/TICI2b. S/p angioplasty acute R ICA occlusion w/ reocclusion     Post IR CT mild to mod RT peri-insular and post frontal convexity contrast stain vs SAH   MRI  R MCA frontal lobe infarct w/ possible mild petechial hemorrhage. B ICA siphon high-grade stenosis vs occlusion. R MCA SAH.  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 No source of embolus   LDL 82   HgbA1c 5.6  VTE prophylaxis - SCDs   No antithrombotic prior to admission, now on ASA alone due to small SAH. Plan to repeat CT on 7/30, if SAH resolves, will consider DAPT for 3 months.   Therapy recommendations:  CIR   Disposition:  Pending   Parotid mass  Incidental finding  Could be etiology of stroke  Tend to be slow growing  ENT referral at d/c w/ bx after rehab completed  Carotid stenosis   Chronic L ICA occlusion  Acute R ICA occlusion S/p R ICA angioplasty w/ reocclusion, ? Dissection from cough  Long term BP 130-150  Blood Pressure Hypotension . Asystole on IR table . BP stable . Off Neo . Long-term BP goal 130-150 . NS at 50 cc / hr  Hyperlipidemia  Home meds:  crestor 10 and fish oil  LDL 82, goal < 70  On crestor 20  Continue statin at discharge  Dysphagia . Secondary to stroke . Speech on board . Now cleared for  dysphagia 3 with honey thick liquids . NS at 75->50cc / hr   Other Stroke Risk Factors  Advanced age  Former Cigarette smoker, quit 2 yrs ago  Former smokeless tobacco user  ETOH use, advised to drink no more than 2 drink(s) a day  Other Active Problems  COPD w/o acute exacerbation   Hypokalemia - 3.1->4.7->3.9->3.2->3.6->3.5  Leukocytosis, WBC 16.3->14.3 ->11.2->10.2->8.2 ->10.4  R Shoulder pain d/t soft tissue injury - X-ray neg   R  parotid hyperintense lesion suspicious for primary neoplasm - outpt ENT f/u  Hospital day # 5   Ethan Hawking, MD PhD Stroke Neurology 04/01/2020 12:51 PM   To contact Stroke Continuity provider, please refer to [email or web address] After hours, contact General Neurology

## 2020-04-01 NOTE — H&P (Signed)
Physical Medicine and Rehabilitation Admission H&P    Chief Complaint  Patient presents with  . Stroke with functional deficits.     HPI:  Ethan Fitzgerald is a 64 year old LH-male with history of COPD, daily HA, dyslipidemia, recent bronchitis with coughing episodes who was admitted on 03/27/20 with left sided weakness, difficulty walking and difficulty speaking.  CT head showed dense M1 segment with occlusive thrombus, occlusion of left ICA age-indeterminate distal reconstitution of cavernous segment and good perfusion of left-MCA and bilateral-ACAs.  He received TPA and underwent cerebral angio with partial revascularization of  R-MCA dominant division as well as attempts at revascularization balloon angioplasty of acutely occluded proximal right ICA with reocclusion.  Follow-up MRI brain showed acute cortical/subcortical right-MCA infarct, additional small infarcts in right caudate nucleus, right pre and postcentral gyri and mid to anterior frontal lobe, SAH and incidental findings of right parotid neoplasm.  Follow-up CT head showed an change in right MCA infarct and stable SAH.  2D echo was technically difficult but showed EF of 55 to 60%.  He tolerated extubation on 07/23 and due to moderate oropharyngeal dysphagia has been advanced to dysphagia 3 with honey liquids.  Dr. Leonie Man question that stroke was due to dissection of large vessel from coughing episodes versus hypercoagulable state from parotid neoplasm.  Patient on ASA alone due to small SAH with recommendations to repeat CT head on 7/30 and consider DAPT for 3 months if SAH resolved.  Blood pressure goals of 130-150 due to intracranial stenosis.  Patient has been reporting right shoulder pain as well as fall prior to admission and x-rays done today negative for fracture or injury.  Therapy ongoing and patient limited by left inattention with impulsivity, left-sided weakness and balance deficits affecting mobility and ADLs.  CIR was  recommended due to functional decline.   Wife reports had uncontrolled BM 2 nights ago- voiding OK. Adequate appetite- getting better- eats slow.     Review of Systems  Constitutional: Negative for chills and fever.  HENT: Negative for hearing loss and tinnitus.   Eyes: Negative for blurred vision and double vision.  Respiratory: Positive for cough. Negative for shortness of breath.   Cardiovascular: Negative for chest pain and palpitations.  Gastrointestinal: Positive for constipation (No BM this week). Negative for abdominal pain, heartburn and nausea.  Genitourinary: Positive for frequency. Negative for dysuria.  Musculoskeletal: Positive for joint pain (sore left shoulder ).  Skin: Negative for itching and rash.  Neurological: Positive for speech change, weakness and headaches. Negative for dizziness.  Psychiatric/Behavioral: The patient has insomnia.   All other systems reviewed and are negative.    Past Medical History:  Diagnosis Date  . COPD (chronic obstructive pulmonary disease) (HCC)    mild  . COVID-19 09/2019  . Erectile dysfunction   . Headache    daily, stress headaches  . Knee pain, left   . Seasonal allergies   . Tobacco dependence     Past Surgical History:  Procedure Laterality Date  . BACK SURGERY     metal plate in back  . COLONOSCOPY    . COLONOSCOPY WITH PROPOFOL N/A 04/26/2019   Procedure: COLONOSCOPY WITH PROPOFOL;  Surgeon: Jonathon Bellows, MD;  Location: [facility] ENDOSCOPY;  Service: Gastroenterology;  Laterality: N/A;  . HERNIA REPAIR Right   . IR ANGIO INTRA EXTRACRAN SEL COM CAROTID INNOMINATE UNI L MOD SED  03/27/2020  . IR ANGIO VERTEBRAL SEL SUBCLAVIAN INNOMINATE UNI L MOD SED  03/27/2020  .  IR ANGIO VERTEBRAL SEL VERTEBRAL UNI R MOD SED  03/27/2020  . IR CT HEAD LTD  03/27/2020  . IR PERCUTANEOUS ART THROMBECTOMY/INFUSION INTRACRANIAL INC DIAG ANGIO  03/27/2020  . KNEE ARTHROSCOPY Left 03/28/2015   Procedure: Arthroscopic partial medial meniscectomy  plus chondral debridement;  Surgeon: Leanor Kail, MD;  Location: [facility];  Service: Orthopedics;  Laterality: Left;  . RADIOLOGY WITH ANESTHESIA N/A 03/27/2020   Procedure: IR WITH ANESTHESIA;  Surgeon: Luanne Bras, MD;  Location: [place];  Service: Radiology;  Laterality: N/A;    Family History  Problem Relation Age of Onset  . Diabetes Mother   . Heart attack Mother 63  . Cancer Father        liver   . COPD Brother   . HIV/AIDS Brother   . Prostate cancer Neg Hx   . Colon cancer Neg Hx     Social History:  Married. 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  He reports that he quit smoking about 2 years ago. His smoking use included cigarettes. He started smoking about 51 years ago. He has a 73.50 pack-year smoking history. He has quit using smokeless tobacco. He reports current alcohol use--6 pack on weekends. He reports that he does not use drugs.    Allergies: No Known Allergies    Medications Prior to Admission  Medication Sig Dispense Refill  . albuterol (VENTOLIN HFA) 108 (90 Base) MCG/ACT inhaler Inhale 1-2 puffs into the lungs every 4 (four) hours as needed for wheezing or shortness of breath. 18 g 2  . Aspirin-Acetaminophen-Caffeine (EXCEDRIN PO) Take 2 tablets by mouth. am    . guaiFENesin-codeine (ROBITUSSIN AC) 100-10 MG/5ML syrup Take 5-10 mLs by mouth 3 (three) times daily as needed for cough. 180 mL 0  . Omega-3 Fatty Acids (FISH OIL) 1000 MG CAPS Take 2,000 mg by mouth in the morning and at bedtime.    . predniSONE (DELTASONE) 50 MG tablet Take 1 tablet (50 mg total) by mouth daily with breakfast. 5 tablet 0  . rosuvastatin (CRESTOR) 10 MG tablet Take 2 tablets (20 mg total) by mouth daily. 90 tablet 1  . Tiotropium Bromide-Olodaterol (STIOLTO RESPIMAT) 2.5-2.5 MCG/ACT AERS Inhale 2 puffs into the lungs daily. 4 g 6  . vitamin C (ASCORBIC ACID) 500 MG tablet Take 500 mg by mouth daily. Am    . azithromycin (ZITHROMAX Z-PAK) 250 MG  tablet Take 2 tabs (500mg  total) on Day 1. Take 1 tab (250mg ) daily for next 4 days. (Patient not taking: Reported on 03/27/2020) 6 tablet 0  . cyclobenzaprine (FLEXERIL) 10 MG tablet Take 1 tablet (10 mg total) by mouth 3 (three) times daily as needed for muscle spasms. (Patient not taking: Reported on 03/27/2020) 60 tablet 1  . naproxen (NAPROSYN) 500 MG tablet TAKE 1 TABLET (500 MG TOTAL) BY MOUTH 2 (TWO) TIMES DAILY WITH A MEAL. FOR 2-4 WEEKS THEN AS NEEDED (Patient not taking: Reported on 03/27/2020) 60 tablet 2  . sildenafil (REVATIO) 20 MG tablet TAKE 1 TO 5 TABLETS BY MOUTH ABOUT 30 MINUTES PRIOR TO SEX.START WITH 1 THEN INCREASE (Patient not taking: Reported on 03/27/2020) 30 tablet 5    Drug Regimen Review  Drug regimen was reviewed and remains appropriate with no significant issues identified  Home: Home Living Family/patient expects to be discharged to:: Private residence Living Arrangements: Spouse/significant other Available Help at Discharge: Family Type of Home: House Home Access: Stairs to enter [employer] of Steps: 1  Home Layout: One level Bathroom Shower/Tub: Multimedia programmer: Handicapped height Home Equipment: Electronics engineer Comments: Pt lives with spouse, who is supportive and who is retired   Lives With: Spouse   Functional History: Prior Function Level of Independence: Independent Comments: 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 [place] Status:  Mobility: Bed Mobility Overal bed mobility: Needs Assistance Bed Mobility: Supine to Sit Rolling: Supervision Supine to sit: Min guard Sit to supine: Min guard, HOB elevated General bed mobility comments: not attending to his condom catheter as coming to sit Transfers Overall transfer level: Needs assistance Equipment used: Rolling walker (2 wheeled) Transfers: Sit to/from Stand Sit to Stand: Min guard Stand pivot  transfers: Min assist General transfer comment: from EOB and from chair with armrests; vc for sequencing with RW (tries to pull up on RW) Ambulation/Gait Ambulation/Gait assistance: Min assist Gait Distance (Feet): 150 Feet (x2 (with and without RW)) Assistive device: None, Rolling walker (2 wheeled) Gait Pattern/deviations: Step-through pattern, Decreased stride length, Decreased weight shift to left, Decreased dorsiflexion - left, Drifts right/left General Gait Details: pt stumbled due to dragging left foot x 2 with LOB when not using RW; reports he feels more unsteady today and prefers to use RW; with RW he ran into objects on his left x 2 with imbalance; required repeated cues to look to his left Gait velocity: faster than is safe for his inattention to left Gait velocity interpretation: <1.8 ft/sec, indicate of risk for recurrent falls Stairs: Yes Stairs assistance: Min guard Stair Management: One rail Right, Step to pattern, Forwards Number of Stairs: 2 General stair comments: instructed to step up with left leg (weaker) and he could but also used rail    ADL: ADL Overall ADL's : Needs assistance/impaired Eating/Feeding: Minimal assistance, Sitting Eating/Feeding Details (indicate cue type and reason): requires verbal cues for precautions, assist for set up, and assist for organization and problem solving  Grooming: Minimal assistance, Cueing for sequencing, Cueing for safety, Standing, Wash/dry hands Grooming Details (indicate cue type and reason): Min A with min guard for safety but mod multimodal cues for locating objects on L side when washing hands Upper Body Bathing: Moderate assistance, Sitting Lower Body Bathing: Minimal assistance, Sit to/from stand Upper Body Dressing : Moderate assistance, Sitting Lower Body Dressing: Moderate assistance, Sit to/from stand Toilet Transfer: Ambulation, RW, Minimal assistance, Cueing for sequencing Toilet Transfer Details (indicate cue  type and reason): simulated to recliner with Min A for balance and verbal cues for seqeuncing Toileting- Clothing Manipulation and Hygiene: Moderate assistance, Sit to/from stand Toileting - Clothing Manipulation Details (indicate cue type and reason): Per nsg staff, pt has been incontinent of stool multiple times  Functional mobility during ADLs: Minimal assistance, Rolling walker, Cueing for safety, Cueing for sequencing General ADL Comments: Pt with inattention to L side and needing verbal cues for safety and sequencing due to deficits on L side.   Cognition: Cognition Overall Cognitive Status: Impaired/Different from baseline Arousal/Alertness: Lethargic Orientation Level: Oriented X4 Attention: Focused, Sustained Focused Attention: Appears intact Sustained Attention: Appears intact Immediate Memory Recall: Sheila Oats, Bed Memory Recall Sock: Without Cue Memory Recall Blue: Without Cue Memory Recall Bed: Not able to recall Awareness: Appears intact Problem Solving: Appears intact Safety/Judgment: Appears intact Cognition Arousal/Alertness: Awake/alert Behavior During Therapy: Impulsive Overall Cognitive Status: Impaired/Different from baseline Area of Impairment: Attention, Memory, Safety/judgement, Awareness, Problem solving Current Attention Level: Sustained Memory: Decreased recall  of precautions, Decreased short-term memory Following Commands: Follows one step commands consistently, Follows multi-step commands inconsistently Safety/Judgement: Decreased awareness of safety, Decreased awareness of deficits Awareness: Emergent Problem Solving: Slow processing, Difficulty sequencing, Requires verbal cues, Requires tactile cues General Comments: Ran into objects on his left x 2 when using RW and then recognized needed to move to his right to pass by object; continued left inattention   Blood pressure (!) 117/89, pulse 78, temperature 98.2 F (36.8 C), temperature source Oral,  resp. rate 18, height 6' (1.829 m), weight 78.9 kg, SpO2 97 %. Physical Exam Vitals and nursing note reviewed.  Constitutional:      Appearance: Normal appearance.     Comments: Awake, alert, poor short term memory, wife at bedside, sitting in recliner, NAD  HENT:     Head: Normocephalic and atraumatic.     Comments: L facial droop Coated tongue- is midline    Right Ear: External ear normal.     Left Ear: External ear normal.     Nose: Nose normal. No congestion.     Mouth/Throat:     Mouth: Mucous membranes are moist.     Pharynx: Oropharynx is clear.  Eyes:     Comments: R gaze preference, however can look Left/laterally if asked- no nystagmus seen; EOMI B/L  Cardiovascular:     Comments: RRR- no M/R/G Pulmonary:     Breath sounds: Examination of the left-middle field reveals wheezing. Examination of the left-lower field reveals wheezing. Wheezing present.     Comments: Coarse breath sounds- good air movement- B/L Abdominal:     Tenderness: There is no abdominal tenderness.     Comments: Soft, NT, ND, (+)BS hypoactive  Musculoskeletal:     Cervical back: Normal range of motion and neck supple. No rigidity.     Comments: RUE- biceps, triceps, WE, grip and finger abd 5/5 LUE_ biceps 4+/5, triceps 4+/5, WE 4/5, grip 4-/5 and finger abd 4-/5 RLE- 5/5 in HF, KF< KE, DF and PF LLE- HF 4/5, KE/KF 5-/5, DF and PF 5/5  Skin:    General: Skin is warm and dry.     Comments: Bruise L elbow- fading  Neurological:     Mental Status: He is alert and oriented to person, place, and time.     Comments: Left facial weakness with mild to moderate dysarthria. Mild left inattention but scans to the left without cues.   L inattention- light touch intact in all 4 extremities and face Knew why here, year, month, but not exact date/day; knew next holiday/president, etc- however didn't remember when had BM  Psychiatric:     Comments: Quiescent; otherwise appropriate     Results for orders placed  or performed during the hospital encounter of 03/27/20 (from the past 48 hour(s))  Glucose, capillary     Status: None   Collection Time: 03/30/20 12:30 PM  Result Value Ref Range   Glucose-Capillary 92 70 - 99 mg/dL    Comment: Glucose reference range applies only to samples taken after fasting for at least 8 hours.  Glucose, capillary     Status: None   Collection Time: 03/30/20  5:24 PM  Result Value Ref Range   Glucose-Capillary 94 70 - 99 mg/dL    Comment: Glucose reference range applies only to samples taken after fasting for at least 8 hours.  Glucose, capillary     Status: None   Collection Time: 03/30/20  8:13 PM  Result Value Ref Range   Glucose-Capillary 92  70 - 99 mg/dL    Comment: Glucose reference range applies only to samples taken after fasting for at least 8 hours.  Glucose, capillary     Status: None   Collection Time: 03/30/20 11:22 PM  Result Value Ref Range   Glucose-Capillary 94 70 - 99 mg/dL    Comment: Glucose reference range applies only to samples taken after fasting for at least 8 hours.  Glucose, capillary     Status: None   Collection Time: 03/31/20  3:07 AM  Result Value Ref Range   Glucose-Capillary 82 70 - 99 mg/dL    Comment: Glucose reference range applies only to samples taken after fasting for at least 8 hours.  CBC     Status: Abnormal   Collection Time: 03/31/20  3:25 AM  Result Value Ref Range   WBC 8.2 4.0 - 10.5 K/uL   RBC 4.04 (L) 4.22 - 5.81 MIL/uL   Hemoglobin 13.2 13.0 - 17.0 g/dL   HCT 38.1 (L) 39 - 52 %   MCV 94.3 80.0 - 100.0 fL   MCH 32.7 26.0 - 34.0 pg   MCHC 34.6 30.0 - 36.0 g/dL   RDW 12.5 11.5 - 15.5 %   Platelets 239 150 - 400 K/uL   nRBC 0.0 0.0 - 0.2 %    Comment: Performed at [facility], [place] [address], [place], [place] [address]  Basic metabolic panel     Status: Abnormal   Collection Time: 03/31/20  3:25 AM  Result Value Ref Range   Sodium 135 135 - 145 mmol/L   Potassium 3.6 3.5 - 5.1 mmol/L   Chloride  107 98 - 111 mmol/L   CO2 21 (L) 22 - 32 mmol/L   Glucose, Bld 99 70 - 99 mg/dL    Comment: Glucose reference range applies only to samples taken after fasting for at least 8 hours.   BUN 13 8 - 23 mg/dL   Creatinine, Ser 0.83 0.61 - 1.24 mg/dL   Calcium 8.8 (L) 8.9 - 10.3 mg/dL   GFR calc non Af Amer >60 >60 mL/min   GFR calc Af Amer >60 >60 mL/min   Anion gap 7 5 - 15    Comment: Performed at [facility] [address] [place], [place] [address]  Glucose, capillary     Status: Abnormal   Collection Time: 03/31/20  7:47 AM  Result Value Ref Range   Glucose-Capillary 108 (H) 70 - 99 mg/dL    Comment: Glucose reference range applies only to samples taken after fasting for at least 8 hours.  Glucose, capillary     Status: Abnormal   Collection Time: 03/31/20 12:12 PM  Result Value Ref Range   Glucose-Capillary 107 (H) 70 - 99 mg/dL    Comment: Glucose reference range applies only to samples taken after fasting for at least 8 hours.  Glucose, capillary     Status: None   Collection Time: 03/31/20  4:15 PM  Result Value Ref Range   Glucose-Capillary 93 70 - 99 mg/dL    Comment: Glucose reference range applies only to samples taken after fasting for at least 8 hours.  Glucose, capillary     Status: None   Collection Time: 03/31/20  8:23 PM  Result Value Ref Range   Glucose-Capillary 83 70 - 99 mg/dL    Comment: Glucose reference range applies only to samples taken after fasting for at least 8 hours.  Glucose, capillary     Status:  None   Collection Time: 04/01/20  1:34 AM  Result Value Ref Range   Glucose-Capillary 87 70 - 99 mg/dL    Comment: Glucose reference range applies only to samples taken after fasting for at least 8 hours.  CBC     Status: None   Collection Time: 04/01/20  3:42 AM  Result Value Ref Range   WBC 10.4 4.0 - 10.5 K/uL   RBC 4.30 4.22 - 5.81 MIL/uL   Hemoglobin 14.2 13.0 - 17.0 g/dL   HCT 40.4 39 - 52 %   MCV 94.0 80.0 - 100.0 fL   MCH 33.0 26.0 -  34.0 pg   MCHC 35.1 30.0 - 36.0 g/dL   RDW 12.4 11.5 - 15.5 %   Platelets 276 150 - 400 K/uL   nRBC 0.0 0.0 - 0.2 %    Comment: Performed at [facility], [place] [address] [place],  [address]  Basic metabolic panel     Status: None   Collection Time: 04/01/20  3:42 AM  Result Value Ref Range   Sodium 136 135 - 145 mmol/L   Potassium 3.5 3.5 - 5.1 mmol/L   Chloride 106 98 - 111 mmol/L   CO2 22 22 - 32 mmol/L   Glucose, Bld 94 70 - 99 mg/dL    Comment: Glucose reference range applies only to samples taken after fasting for at least 8 hours.   BUN 13 8 - 23 mg/dL   Creatinine, Ser 0.95 0.61 - 1.24 mg/dL   Calcium 9.0 8.9 - 10.3 mg/dL   GFR calc non Af Amer >60 >60 mL/min   GFR calc Af Amer >60 >60 mL/min   Anion gap 8 5 - 15    Comment: Performed at [facility] [address], [place], [place] [address]  Glucose, capillary     Status: None   Collection Time: 04/01/20  4:14 AM  Result Value Ref Range   Glucose-Capillary 97 70 - 99 mg/dL    Comment: Glucose reference range applies only to samples taken after fasting for at least 8 hours.  Glucose, capillary     Status: None   Collection Time: 04/01/20  7:58 AM  Result Value Ref Range   Glucose-Capillary 95 70 - 99 mg/dL    Comment: Glucose reference range applies only to samples taken after fasting for at least 8 hours.   Comment 1 Notify RN    Comment 2 Document in Chart    DG Shoulder Left Port  Result Date: 03/31/2020 CLINICAL DATA:  Shoulder pain after fall EXAM: LEFT SHOULDER COMPARISON:  None. FINDINGS: There is no evidence of fracture or dislocation. There is no evidence of arthropathy or other focal bone abnormality. Soft tissues are unremarkable. IMPRESSION: Negative. Electronically Signed   By: Donavan Foil M.D.   On: 03/31/2020 19:42   DG Swallowing Func-Speech Pathology  Result Date: 03/30/2020 Objective Swallowing Evaluation: Type of Study: MBS-Modified Barium Swallow Study  Patient Details Name:  DAMEON SOLTIS MRN: [id number] Date of Birth: 15-Dec-1955 Today's Date: 03/30/2020 Time: SLP Start Time (ACUTE ONLY): 1145 -SLP Stop Time (ACUTE ONLY): 1210 SLP Time Calculation (min) (ACUTE ONLY): 25 min Past Medical History: Past Medical History: Diagnosis Date . COPD (chronic obstructive pulmonary disease) (HCC)   mild . Erectile dysfunction  . Headache   daily, stress headaches . Knee pain, left  . Seasonal allergies  . Tobacco dependence  Past Surgical History: Past Surgical History: Procedure Laterality Date . BACK SURGERY  metal plate in back . COLONOSCOPY   . COLONOSCOPY WITH PROPOFOL N/A 04/26/2019  Procedure: COLONOSCOPY WITH PROPOFOL;  Surgeon: Jonathon Bellows, MD;  Location: [facility] ENDOSCOPY;  Service: Gastroenterology;  Laterality: N/A; . HERNIA REPAIR Right  . KNEE ARTHROSCOPY Left 03/28/2015  Procedure: Arthroscopic partial medial meniscectomy plus chondral debridement;  Surgeon: Leanor Kail, MD;  Location: [place];  Service: Orthopedics;  Laterality: Left; . RADIOLOGY WITH ANESTHESIA N/A 03/27/2020  Procedure: IR WITH ANESTHESIA;  Surgeon: Luanne Bras, MD;  Location: [facility];  Service: Radiology;  Laterality: N/A; HPI: 64 y.o. male with history of COPD presenting with L sided weakness.  R MCA infarct s/p tPA and IR with partial revascularization. CT head 7/23: moderate-sized acute right MCA infarct centered in right frontal operculum; Hyperdensity within regional sulci, right sylvian fissure, and interpeduncular cistern may reflect a combination of subarachnoid hemorrhage and contrast.   Subjective: Pt was alert and cooperative Assessment / Plan / Recommendation CHL IP CLINICAL IMPRESSIONS 03/30/2020 Clinical Impression Pt was seen for a modified barium swallow study and he presents with moderate oropharyngeal dysphagia with resultant silent aspiration of thin liquid (PAS 8) and sensed aspiration of nectar-thick liquid (PAS 6).  Aspiration appeared to be secondary to delayed swallow  initiation at the pyriform sinus and delayed laryngeal closure.  No laryngeal penetration or aspiration was observed with honey-thick liquid, puree, or regular solids.  Oral phase was remarkable for anterior spillage on the L side with thin liquid trials, reduced mastication of regular solids (at least partially contributable to edentulism), reduced lingual control resulting in premature spillage to the pharynx, and reduced lingual strength resulting in oral residue.  Pharyngeal phase was remarkable for reduced BOT retraction resulting in vallecular residue, reduced hyolaryngeal elevation/excursion resulting in vallecular and pyriform residue, and reduced epiglottic inversion with all liquid trials resulting in vallecular residue.  Pt was cued to take effortful dry swallows after trials in an attempt to reduce pharyngeal residue, but this was only partially successful.  Pharyngoesophageal phase was remarkable for suspected CP bar around the level of C5-C6 and suspected mild osteophytes at C5 and C6.  No esophageal backflow was observed.  Recommend initiation of Dysphagia 1 (puree) solids and honey-thick liquids (no straws) with medications administered crushed in puree.  Pt will benefit from set up and intermittent supervision to cue for the following compensatory strategies: 1) Small bites/sips 2) Effortful dry swallow following each bite/sip 3) Slow rate of intake 4) Sit upright as possible.   SLP Visit Diagnosis Dysphagia, oropharyngeal phase (R13.12) Attention and concentration deficit following -- Frontal lobe and executive function deficit following -- Impact on safety and function Moderate aspiration risk   CHL IP TREATMENT RECOMMENDATION 03/30/2020 Treatment Recommendations Therapy as outlined in treatment plan below   Prognosis 03/30/2020 Prognosis for Safe Diet Advancement Good Barriers to Reach Goals -- Barriers/Prognosis Comment -- CHL IP DIET RECOMMENDATION 03/30/2020 SLP Diet Recommendations Dysphagia 1  (Puree) solids;Honey thick liquids Liquid Administration via Cup Medication Administration Crushed with puree Compensations Slow rate;Small sips/bites;Effortful swallow;Multiple dry swallows after each bite/sip Postural Changes Seated upright at 90 degrees   CHL IP OTHER RECOMMENDATIONS 03/30/2020 Recommended Consults -- Oral Care Recommendations Oral care BID Other Recommendations Order thickener from pharmacy;Remove water pitcher;Have oral suction available   CHL IP FOLLOW UP RECOMMENDATIONS 03/30/2020 Follow up Recommendations Inpatient Rehab   CHL IP FREQUENCY AND DURATION 03/30/2020 Speech Therapy Frequency (ACUTE ONLY) min 2x/week Treatment Duration 2 weeks      CHL IP ORAL PHASE 03/30/2020  Oral Phase Impaired Oral - Pudding Teaspoon -- Oral - Pudding Cup -- Oral - Honey Teaspoon -- Oral - Honey Cup Delayed oral transit;Lingual/palatal residue Oral - Nectar Teaspoon -- Oral - Nectar Cup Delayed oral transit;Lingual/palatal residue;Premature spillage Oral - Nectar Straw Premature spillage;Delayed oral transit;Lingual/palatal residue Oral - Thin Teaspoon Left anterior bolus loss;Lingual/palatal residue;Delayed oral transit;Decreased bolus cohesion;Premature spillage Oral - Thin Cup Left anterior bolus loss;Premature spillage;Delayed oral transit;Lingual/palatal residue Oral - Thin Straw Left anterior bolus loss;Premature spillage;Delayed oral transit;Lingual/palatal residue Oral - Puree Weak lingual manipulation;Lingual/palatal residue;Delayed oral transit Oral - Mech Soft -- Oral - Regular Impaired mastication;Weak lingual manipulation;Lingual/palatal residue;Delayed oral transit Oral - Multi-Consistency -- Oral - Pill -- Oral Phase - Comment --  CHL IP PHARYNGEAL PHASE 03/30/2020 Pharyngeal Phase Impaired Pharyngeal- Pudding Teaspoon -- Pharyngeal -- Pharyngeal- Pudding Cup -- Pharyngeal -- Pharyngeal- Honey Teaspoon -- Pharyngeal -- Pharyngeal- Honey Cup Delayed swallow initiation-vallecula;Pharyngeal residue -  valleculae;Pharyngeal residue - pyriform;Pharyngeal residue - posterior pharnyx;Reduced pharyngeal peristalsis;Reduced laryngeal elevation;Reduced airway/laryngeal closure;Reduced epiglottic inversion;Reduced tongue base retraction Pharyngeal Material does not enter airway Pharyngeal- Nectar Teaspoon -- Pharyngeal -- Pharyngeal- Nectar Cup Delayed swallow initiation-vallecula;Penetration/Aspiration during swallow;Reduced anterior laryngeal mobility;Reduced laryngeal elevation;Reduced airway/laryngeal closure;Reduced tongue base retraction;Reduced epiglottic inversion;Pharyngeal residue - valleculae;Pharyngeal residue - pyriform Pharyngeal Material enters airway, remains ABOVE vocal cords and not ejected out Pharyngeal- Nectar Straw Delayed swallow initiation-pyriform sinuses;Pharyngeal residue - valleculae;Penetration/Aspiration during swallow;Reduced epiglottic inversion;Reduced anterior laryngeal mobility;Reduced laryngeal elevation;Reduced airway/laryngeal closure;Reduced tongue base retraction;Trace aspiration Pharyngeal Material enters airway, passes BELOW cords then ejected out Pharyngeal- Thin Teaspoon Delayed swallow initiation-pyriform sinuses;Penetration/Aspiration before swallow;Penetration/Aspiration during swallow;Reduced epiglottic inversion;Reduced anterior laryngeal mobility;Reduced laryngeal elevation;Reduced airway/laryngeal closure;Reduced tongue base retraction;Trace aspiration;Pharyngeal residue - valleculae Pharyngeal Material enters airway, passes BELOW cords without attempt by patient to eject out (silent aspiration) Pharyngeal- Thin Cup Delayed swallow initiation-pyriform sinuses;Penetration/Aspiration before swallow;Penetration/Apiration after swallow;Reduced epiglottic inversion;Reduced anterior laryngeal mobility;Reduced laryngeal elevation;Reduced airway/laryngeal closure;Reduced tongue base retraction Pharyngeal Material enters airway, passes BELOW cords and not ejected out despite  cough attempt by patient Pharyngeal- Thin Straw Delayed swallow initiation-pyriform sinuses;Penetration/Aspiration during swallow;Pharyngeal residue - valleculae;Reduced epiglottic inversion;Reduced anterior laryngeal mobility;Reduced laryngeal elevation;Reduced airway/laryngeal closure;Reduced tongue base retraction Pharyngeal Material enters airway, remains ABOVE vocal cords and not ejected out Pharyngeal- Puree Delayed swallow initiation-vallecula;Pharyngeal residue - valleculae;Pharyngeal residue - pyriform;Reduced anterior laryngeal mobility;Reduced tongue base retraction Pharyngeal Material does not enter airway Pharyngeal- Mechanical Soft -- Pharyngeal -- Pharyngeal- Regular Delayed swallow initiation-vallecula;Reduced anterior laryngeal mobility;Pharyngeal residue - valleculae;Pharyngeal residue - pyriform;Pharyngeal residue - posterior pharnyx Pharyngeal Material does not enter airway Pharyngeal- Multi-consistency -- Pharyngeal -- Pharyngeal- Pill -- Pharyngeal -- Pharyngeal Comment --  CHL IP CERVICAL ESOPHAGEAL PHASE 03/30/2020 Cervical Esophageal Phase WFL Pudding Teaspoon -- Pudding Cup -- Honey Teaspoon -- Honey Cup -- Nectar Teaspoon -- Nectar Cup -- Nectar Straw -- Thin Teaspoon -- Thin Cup -- Thin Straw -- Puree -- Mechanical Soft -- Regular -- Multi-consistency -- Pill -- Cervical Esophageal Comment -- Colin Mulders M.S., CCC-SLP Acute Rehabilitation Services Office: [phone] [place] 03/30/2020, 1:17 PM                  Medical Problem List and Plan: 1.  Impaired function and L hemiparesis/dysphagia secondary to R MCA CVA s/p tPA  -patient may  shower  -ELOS/Goals: 2-2.5 weeks; mod I to supervision 2.  Antithrombotics: -DVT/anticoagulation:  Pharmaceutical: Lovenox  -antiplatelet therapy: ASA alone--to repeat CCT on 07/30 and if SAH resolves to consider DAPT X 3 months.  3. Headaches/Pain Management:  Tylenol prn.  4. Mood: LCSW to follow for evaluation and support.    -antipsychotic agents: N/A 5. Neuropsych: This patient  Is not fully capable of making decisions on his own behalf. 6. Skin/Wound Care: Routine pressure relief measures.  7. Fluids/Electrolytes/Nutrition: Monitor I/O. Check lytes in am as on modified diet. 8. HTN: with recent hypotension intermittently-Long term BP goal 130-150 due to carotid stenosis. Monitor BP tid --continue  9. Centrilobar Emphysema: On maintenance inhaler (Stiolto respimat) at home-->resume  10. Hyperlipidemia: On Crestor 11. Parotid mass: Follow up with ENT after discharge.  12. Right shoulder pain: Question RTC strain--will add voltaren gel with ice for local measures.  13. Dysphagia: On Dysphagia 3, honey liquids --encourage fluid intake.  Single ice chips after oral care.  14. Constipation: Has not had any for past 5 day? -had a large BM 2 nights ago per wife- still needs to keep going.     Bary Leriche, PA-C 04/01/2020    I have personally performed a face to face diagnostic evaluation of this patient and formulated the key components of the plan.  Additionally, I have personally reviewed laboratory data, imaging studies, as well as relevant notes and concur with the physician assistant's documentation above.   The patient's status has not changed from the original H&P.  Any changes in documentation from the acute care chart have been noted above.

## 2020-04-01 NOTE — Progress Notes (Signed)
Raechel Ache, OT  Rehab Admission Coordinator  Physical Medicine and Rehabilitation  PMR Pre-admission     Signed  Date of Service:  03/31/2020  3:59 PM      Related encounter: ED to Hosp-Admission (Current) from 03/27/2020 in [facility]      Signed       Show:Clear all [x] Manual[x] Template[x] Copied  Added by: [x] Raechel Ache, OT  [] Hover for details PMR Admission Coordinator Pre-Admission Assessment   Patient: Ethan Fitzgerald is an 64 y.o., male MRN: [id number] DOB: [birth year] Height: 6' (182.9 cm) Weight: 78.9 kg                                                                                                                                                  Insurance Information HMO: yes    PPO:      PCP:      IPA:      80/20:      OTHER:  PRIMARY: [employer]      Policy#: [id number]      Subscriber: patient CM Name: Lattie Haw       Phone#: [phone]     Fax#: [phone] Pre-Cert#: [id number]      Employer: Josem Kaufmann provided by Lattie Haw for admit to CIR on 04/01/20. Pt is approved with weekly updates due to [employer] (p); [phone] (f); [phone]  Benefits:  Phone #: online     Name: [email or web address] Eff. Date: 09/07/19-09/05/20     Deduct: does not have for in-network providers      Out of Pocket Max: $3,900 ($105.86 met)      Life Max: NA  CIR: $295/day co-pay for days 1-6, $0/day co-pay for days 7-90      SNF: $0/day co-pay for days 1-20, $184/day co-pay for days 21-100; limited to 100 days/cal yr Outpatient: $10-$40/visit co-pay pending service; visits limited by medical necessity    Home Health: 100% coverage, 0% co-insurance; limited by medical necessity      DME: 80% coverage, 20% co-insurance    Providers:  SECONDARY: None      Policy#:       Phone#:    Development worker, community:       Phone#:    The Engineer, petroleum" for patients in Inpatient Rehabilitation Facilities with attached "Privacy Act [place] Records" was provided and verbally reviewed with: Patient   Emergency Contact Information         Contact Information     Name Relation Home Work Mobile    Hicklin,Joan Spouse [phone]           Current Medical History  Patient Admitting Diagnosis: Right 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 LH-male with history of COPD, recent bronchitis, HA who was  admitted on 03/27/20 with left sided weakness and difficulty speaking. CT head showed dense M1 segment with occlusive thrombus, occlusion of L-ICA age indeterminate with distal reconstitution at cavernous segment and good perfusion of L-MCA and B-ACAs, He underwent cerebral angio with partial revascularization of R-MCA dominant division with revascularization and balloon angioplasty of acutely occluded proximal R-ICA with reocclusion. Follow up MRI brain showed acute cortical/subcortical R-MCA infarct, additional small infarcts in right caudate nucleus, right pre and postcentral gyri and mid to anterior left frontal lobe, SAH and incidental noted of right parotid neoplasm. Follow up CT  Head showed unchanged acute R-MCA infarct with stable SAH.  2D echo technically difficult and showed EF 55-60%.    He tolerated extubation on 07/23 and dysphagia 1, honey liquids due to moderate oropharyngeal dysphagia. Dr. Leonie Man questioned that stroke due to dissection of large vessel due to coughing episodes v/s hypercoagulable state from parotid neoplasm--on ASA alone due to small SAH.  Pain left shoulder felt to be due to focal muscle strain from fall prior to stroke. Therapy evaluations completed revealing left sided left inattention, poor safety awareness with impulsivity affecting mobility and ADLs. CIR recommended. Pt is to be admitted to CIR on 04/01/20.     Complete NIHSS TOTAL: 4 Glasgow Coma Scale Score: 15   Past Medical History      Past Medical History:    Diagnosis Date  . COPD (chronic obstructive pulmonary disease) (HCC)      mild  . COVID-19 09/2019  . Erectile dysfunction    . Headache      daily, stress headaches  . Knee pain, left    . Seasonal allergies    . Tobacco dependence        Family History  family history includes COPD in his brother; Cancer in his father; Diabetes in his mother; HIV/AIDS in his brother; Heart attack (age of onset: 48) in his mother.   Prior Rehab/Hospitalizations:  Has the patient had prior rehab or hospitalizations prior to admission? No   Has the patient had major surgery during 100 days prior to admission? Yes   Current Medications    Current Facility-Administered Medications:  .  0.9 %  sodium chloride infusion, , Intravenous, Continuous, Rosalin Hawking, MD, Last Rate: 50 mL/hr at 03/30/20 2036, Rate Change at 03/30/20 2036 .  0.9 %  sodium chloride infusion, 250 mL, Intravenous, Continuous, Jacky Kindle, MD, Stopped at 03/27/20 1342 .  acetaminophen (TYLENOL) tablet 650 mg, 650 mg, Oral, Q4H PRN, 650 mg at 04/01/20 1019 **OR** acetaminophen (TYLENOL) 160 MG/5ML solution 650 mg, 650 mg, Per Tube, Q4H PRN **OR** acetaminophen (TYLENOL) suppository 650 mg, 650 mg, Rectal, Q4H PRN, Greta Doom, MD .  aspirin chewable tablet 324 mg, 324 mg, Oral, Daily, 324 mg at 04/01/20 5701 **OR** aspirin suppository 300 mg, 300 mg, Rectal, Daily, Rosalin Hawking, MD .  bisacodyl (DULCOLAX) suppository 10 mg, 10 mg, Rectal, Daily PRN, Rosalin Hawking, MD, 10 mg at 03/29/20 2124 .  chlorhexidine (PERIDEX) 0.12 % solution 15 mL, 15 mL, Mouth Rinse, BID, Garvin Fila, MD, 15 mL at 04/01/20 1133 .  docusate sodium (COLACE) capsule 100 mg, 100 mg, Oral, BID, Joselyn Glassman A, RPH, 100 mg at 04/01/20 7793 .  insulin aspart (novoLOG) injection 0-6 Units, 0-6 Units, Subcutaneous, Q4H, Christian, Rylee, MD .  ipratropium-albuterol (DUONEB) 0.5-2.5 (3) MG/3ML nebulizer solution 3 mL, 3 mL, Nebulization, Q6H PRN, Leonie Man,  Pramod S, MD .  labetalol (NORMODYNE) injection 5-10 mg, 5-10 mg,  Intravenous, Q2H PRN **AND** [DISCONTINUED] nicardipine (CARDENE) 1m in 0.86% saline 2020mIV infusion (0.1 mg/ml), 0-15 mg/hr, Intravenous, Continuous PRN, KiGreta DoomMD .  pantoprazole (PROTONIX) EC tablet 40 mg, 40 mg, Oral, QHS, XuRosalin HawkingMD, 40 mg at 03/31/20 2154 .  Resource ThickenUp Clear, , Oral, PRN, XuRosalin HawkingMD .  Resource ThickenUp Clear, , Oral, PRN, XuRosalin HawkingMD .  rosuvastatin (CRESTOR) tablet 20 mg, 20 mg, Oral, Daily, XuRosalin HawkingMD, 20 mg at 04/01/20 [id number] Patients Current Diet:     Diet Order                      DIET DYS 3 Room service appropriate? Yes; Fluid consistency: Honey Thick  Diet effective now                      Precautions / Restrictions Precautions Precautions: Fall Precaution Comments: impulsive; Lt inattention  Restrictions Weight Bearing Restrictions: No    Has the patient had 2 or more falls or a fall with injury in the past year?No   Prior Activity Level Community (5-7x/wk): worked full time in [place]; Independent without an AD PTA.    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  Independent   Indoor Mobility: Did the patient need assistance with walking from room to room (with or without device)? Independent   Stairs: Did the patient need assistance with internal or external stairs (with or without device)? Independent   Functional Cognition: Did the patient need help planning regular tasks such as shopping or remembering to take medications? Independent   Home Assistive Devices / Equipment Home Equipment: Shower seat   Prior Device Use: Indicate devices/aids used by the patient prior to current  illness, exacerbation or injury? None of the above   Current Functional Level Cognition   Arousal/Alertness: Lethargic Overall Cognitive Status: Impaired/Different from baseline Current Attention Level: Sustained Orientation Level: Oriented X4 Following Commands: Follows one step commands consistently, Follows multi-step commands inconsistently Safety/Judgement: Decreased awareness of safety, Decreased awareness of deficits General Comments: forgets to check his left hand as he comes to stand with RW and begins walking without left hand on RW (required cues x3 transfers); ran into object on left x 1 Attention: Focused, Sustained Focused Attention: Appears intact Sustained Attention: Appears intact Awareness: Appears intact Problem Solving: Appears intact Safety/Judgment: Appears intact    Extremity Assessment (includes Sensation/Coordination)   Upper Extremity Assessment: LUE deficits/detail LUE Deficits / Details: unable to complete shoulder movements due to pt. Pt able to complete 3/5 wrist flex/ext, sup/pro, and elbow flex/ext without pain.  LUE: Unable to fully assess due to pain LUE Sensation: decreased proprioception LUE Coordination: decreased gross motor, decreased fine motor  Lower Extremity Assessment: Defer to PT evaluation LLE Deficits / Details: AROM WFL strength grossly 4/5     ADLs   Overall ADL's : Needs assistance/impaired Eating/Feeding: Minimal assistance, Sitting Eating/Feeding Details (indicate cue type and reason): requires verbal cues for precautions, assist for set up, and assist for organization and problem solving  Grooming: Minimal assistance, Cueing for sequencing, Cueing for safety, Standing, Wash/dry hands Grooming Details (indicate cue type and reason): Min A with min guard for safety but mod multimodal cues for locating  objects on L side when washing hands Upper Body Bathing: Moderate assistance, Sitting Lower Body Bathing: Minimal assistance, Sit  to/from stand Upper Body Dressing : Moderate assistance, Sitting Lower Body Dressing: Moderate assistance, Sit to/from stand Toilet Transfer: Ambulation, RW, Minimal assistance, Cueing for sequencing Toilet Transfer Details (indicate cue type and reason): simulated to recliner with Min A for balance and verbal cues for seqeuncing Toileting- Clothing Manipulation and Hygiene: Moderate assistance, Sit to/from stand Toileting - Clothing Manipulation Details (indicate cue type and reason): Per nsg staff, pt has been incontinent of stool multiple times  Functional mobility during ADLs: Minimal assistance, Rolling walker, Cueing for safety, Cueing for sequencing General ADL Comments: Pt with inattention to L side and needing verbal cues for safety and sequencing due to deficits on L side.      Mobility   Overal bed mobility: Needs Assistance Bed Mobility: Sit to Supine Rolling: Supervision Supine to sit: Min guard Sit to supine: Supervision General bed mobility comments: entered bed from right side, therefore left arm staying on bed not an issue today     Transfers   Overall transfer level: Needs assistance Equipment used: Rolling walker (2 wheeled) Transfers: Sit to/from Stand Sit to Stand: Min guard Stand pivot transfers: Min assist General transfer comment: from EOB and from chair with armrests; vc for sequencing with RW (tries to pull up on RW); x 3 neglected to put left hand on the walker grip before starting to walk     Ambulation / Gait / Stairs / Wheelchair Mobility   Ambulation/Gait Ambulation/Gait assistance: Herbalist (Feet): 100 Feet Assistive device: Rolling walker (2 wheeled) Gait Pattern/deviations: Step-through pattern, Decreased stride length, Decreased weight shift to left, Decreased dorsiflexion - left, Drifts right/left, Trunk flexed General Gait Details: vc for upright posture and proximity to RW, when remains upright he does a better job of clearing left  foot; excessively wide turn to his left x 1 (states he thought he had to go that far to avoid a person on his left (poorly judged distance and almost ran into wall on his right) Gait velocity: faster than is safe for his inattention to left Gait velocity interpretation: <1.8 ft/sec, indicate of risk for recurrent falls Stairs: Yes Stairs assistance: Min guard Stair Management: One rail Right, Step to pattern, Forwards Number of Stairs: 2 General stair comments: instructed to step up with left leg (weaker) for strengthening; pt also able to lower using left leg     Posture / Balance Balance Overall balance assessment: Needs assistance Sitting-balance support: Feet supported, No upper extremity supported Sitting balance-Leahy Scale: Fair Standing balance support: During functional activity, Bilateral upper extremity supported Standing balance-Leahy Scale: Fair Standing balance comment: static standing no UE support; with weight-shifting and turning to look as changing his gowns +imbalance Standardized Balance Assessment Standardized Balance Assessment : Dynamic Gait Index Dynamic Gait Index Level Surface: Mild Impairment Change in Gait Speed: Mild Impairment Gait with Horizontal Head Turns: Mild Impairment Gait with Vertical Head Turns: Mild Impairment Gait and Pivot Turn: Mild Impairment Step Over Obstacle: Mild Impairment Step Around Obstacles: Mild Impairment Steps: Moderate Impairment Total Score: 15     Special needs/care consideration Skin: surgical incision    Designated visitor : [place] (from acute therapy documentation) Living Arrangements: Spouse/significant other  Lives With: Spouse Available Help at Discharge: Family Type of Home: House Home Layout: One level Home Access: Stairs to enter [employer] of  Steps: 1 Bathroom Shower/Tub: Multimedia programmer: Handicapped height Additional Comments: Pt lives with  spouse, who is supportive and who is retired    Nurse, learning disability for Discharge Living Setting: House, Lives with (comment) (wife) Type of Home at Discharge: House Discharge Home Layout: One level Discharge Home Access: Stairs to enter [place]: Can reach both [place] of Steps: 2 Discharge Bathroom Shower/Tub: Walk-in shower Discharge [place]: Handicapped height Discharge [place] Accessibility: Yes How Accessible: Accessible via walker Does the patient have any problems obtaining your medications?: Yes (Describe) (reports difficulty affording inhaler)   Social/Family/Support Systems Patient Roles: Spouse, Other (Comment) (full time employee ) Contact Information: wife: [name] ([phone]) daughter Bonna Gains): [phone] Anticipated Caregiver: [name]  Anticipated Caregiver's Contact Information: see above Ability/Limitations of Caregiver: Min A Caregiver Availability: 24/7 Discharge Plan Discussed with Primary Caregiver: Yes (pt and [name]) Is Caregiver In Agreement with Plan?: Yes Does Caregiver/Family have Issues with Lodging/Transportation while Pt is in Rehab?: No     Goals Patient/Family Goal for Rehab: PT/OT: Mod I/Supervision; SLP: Supervision Expected length of stay: 7-10 days Pt/Family Agrees to Admission and willing to participate: Yes Program Orientation Provided & Reviewed with Pt/Caregiver Including Roles  & Responsibilities: Yes  Barriers to Discharge: Home environment access/layout  Barriers to Discharge Comments: steps to enter home     Decrease burden of Care through IP rehab admission: NA   Possible need for SNF placement upon discharge:Not anticipated; pt has great social support from his wife at DC but anticipate he can reach Mod I level after a short CIR stay to allow him to return home safely.      Patient Condition: This patient's condition remains as documented in the consult dated 03/31/20, in which the  Rehabilitation Physician determined and documented that the patient's condition is appropriate for intensive rehabilitative care in an inpatient rehabilitation facility. Will admit to inpatient rehab today.    Preadmission Screen Completed By:  Raechel Ache, OT, 04/01/2020 4:39 PM ______________________________________________________________________   Discussed status with Dr. Dagoberto Ligas on 04/01/20 at 4:39PM and received approval for admission today.   Admission Coordinator:  Raechel Ache, time 4:39PM/Date 04/01/20               Cosigned by: Courtney Heys, MD at 04/01/2020  4:41 PM  Revision History                Note Details  Author Raechel Ache, OT File Time 04/01/2020  4:39 PM  Author Type Rehab Admission Coordinator Status Signed  Last Editor Raechel Ache, OT Service Physical Medicine and Rehabilitation

## 2020-04-01 NOTE — Progress Notes (Signed)
Pt had orders for D/C  to Rehab, Rehab RN called that pt had a bed assigned tonight, RN Collie Siad called report to Rehab RN at 2000, pt taken down  at 2017 alongside his belongings, was however reassured.Edmonia Caprio, Ashwika Freels Efe

## 2020-04-01 NOTE — Progress Notes (Addendum)
Speech Language Pathology Treatment: Dysphagia  Patient Details Name: Ethan Fitzgerald MRN: [id number] DOB: June 29, 1956 Today's Date: 04/01/2020 Time: 3888-2800 SLP Time Calculation (min) (ACUTE ONLY): 60 min  Assessment / Plan / Recommendation Clinical Impression  SLP observed with breakfast to address dysphagia and reinforce compensation strategies.  Pt became tearful during session realizing ramifications of his CVA.  Pt today observed with ice chips, pancakes, eggs, honey thick Sprite  with oral pocketing on left- benefiting from max cues to clear.  Small single boluses and use of puree to orally transit residuals helpful.  Cough noted with HOB elevation - but also with intake x4 during session after large egg bolus, honey thick liquid and water from melted ice.  Cough was not productive during session but is productive at times per pt.  Anticipate pt's diet will be able to be advanced clinically at least to nectar thick liquid as pt progresses.  He would be a good candidate for the water protocol in CIR.    Pt reports baseline coughing up to 4 times with his morning coffee over the last six months.  He states this coughing with liquids started one day randomly and didn't stop.  Yesterday pt's wife confirmed pt's cough with po prior to CVA.  This statement causes SLP to suspect baseline dysphagia.  Pt did not inform MD of coughing with liquids.    Recommend to continue dys3/honey thick diet with allowance of SINGLE ice chips after mouth care.  Implored to pt importance of taking SMALL bites - head tilt right may be helpful to mitigate oral pocketing of foods.    Min verbal cues to attend to left.  Pt with prospective memory - regarding SLP posting sign to inform visitors to sit to left side of the pt to improve his attention.  Speech intelligible adequte for phrase level information in quiet room.    Pt able to describe rehab MD who visited pt yesterday without cue and need to call for assistance  due to fall risk.  Pt making great progress today although he is demonstrating increased frustration with realization of his deficits.  He was advised that chaplain referral in hospital is possible if needed to help pt's deal with transition to new life challenges.     Of note, pt reports his stroke symptoms started when he woke up coughing - advised he mention this information to his MD.  Pt is hopeful to transition to CIR today for therapy. Thanks for allowing me to help care for this most pleasant pt and family.     HPI HPI: 64 y.o. male with history of COPD presenting with L sided weakness.  R MCA infarct s/p tPA and IR with partial revascularization. CT head 7/23: moderate-sized acute right MCA infarct centered in right frontal operculum; Hyperdensity within regional sulci, right sylvian fissure, and interpeduncular cistern may reflect a combination of subarachnoid hemorrhage and contrast.        SLP Plan  Continue with current plan of care       Recommendations  Diet recommendations: Dysphagia 3 (mechanical soft);Honey-thick liquid;Other(comment) (ice) Liquids provided via: Cup;No straw Medication Administration: Whole meds with puree (crush if large) Supervision: Staff to assist with self feeding;Full supervision/cueing for compensatory strategies Compensations: Slow rate;Small sips/bites;Other (Comment);Follow solids with liquid ("cough and expectorate" at end of meal to clear pharynx) Postural Changes and/or Swallow Maneuvers: Seated upright 90 degrees;Upright 30-60 min after meal  Oral Care Recommendations: Oral care BID;Oral care before and after PO (oral suction prn) Follow up Recommendations: Inpatient Rehab SLP Visit Diagnosis: Dysphagia, oropharyngeal phase (R13.12) Plan: Continue with current plan of care       GO                Macario Golds 04/01/2020, 9:15 AM  Kathleen Lime, MS [facility] [phone]

## 2020-04-01 NOTE — Progress Notes (Signed)
Inpatient Rehabilitation-Admissions Coordinator   I have received insurance approval and medical clearance from attending service for admit to CIR today. Pt notified of bed offer and accepted. Reviewed insurance benefits letter and consent forms with pt and his wife. All questions answered. RN and [facility] team notified of plan for admit today.   Raechel Ache, OTR/L  Rehab Admissions Coordinator  [phone] 04/01/2020 7:29 PM

## 2020-04-02 ENCOUNTER — Inpatient Hospital Stay (HOSPITAL_COMMUNITY): Payer: Medicare HMO

## 2020-04-02 ENCOUNTER — Encounter (HOSPITAL_COMMUNITY): Payer: Self-pay | Admitting: Physical Medicine & Rehabilitation

## 2020-04-02 ENCOUNTER — Inpatient Hospital Stay (HOSPITAL_COMMUNITY): Payer: Medicare HMO | Admitting: Occupational Therapy

## 2020-04-02 ENCOUNTER — Inpatient Hospital Stay (HOSPITAL_COMMUNITY): Payer: Medicare HMO | Admitting: Physical Therapy

## 2020-04-02 ENCOUNTER — Other Ambulatory Visit: Payer: Self-pay

## 2020-04-02 DIAGNOSIS — I63511 Cerebral infarction due to unspecified occlusion or stenosis of right middle cerebral artery: Secondary | ICD-10-CM

## 2020-04-02 LAB — CBC WITH DIFFERENTIAL/PLATELET
Abs Immature Granulocytes: 0.07 10*3/uL (ref 0.00–0.07)
Basophils Absolute: 0.1 10*3/uL (ref 0.0–0.1)
Basophils Relative: 1 %
Eosinophils Absolute: 0.4 10*3/uL (ref 0.0–0.5)
Eosinophils Relative: 4 %
HCT: 42.1 % (ref 39.0–52.0)
Hemoglobin: 14.8 g/dL (ref 13.0–17.0)
Immature Granulocytes: 1 %
Lymphocytes Relative: 21 %
Lymphs Abs: 2.1 10*3/uL (ref 0.7–4.0)
MCH: 32.8 pg (ref 26.0–34.0)
MCHC: 35.2 g/dL (ref 30.0–36.0)
MCV: 93.3 fL (ref 80.0–100.0)
Monocytes Absolute: 0.7 10*3/uL (ref 0.1–1.0)
Monocytes Relative: 7 %
Neutro Abs: 6.8 10*3/uL (ref 1.7–7.7)
Neutrophils Relative %: 66 %
Platelets: 272 10*3/uL (ref 150–400)
RBC: 4.51 MIL/uL (ref 4.22–5.81)
RDW: 12.7 % (ref 11.5–15.5)
WBC: 10.2 10*3/uL (ref 4.0–10.5)
nRBC: 0 % (ref 0.0–0.2)

## 2020-04-02 LAB — COMPREHENSIVE METABOLIC PANEL
ALT: 26 U/L (ref 0–44)
AST: 24 U/L (ref 15–41)
Albumin: 3 g/dL — ABNORMAL LOW (ref 3.5–5.0)
Alkaline Phosphatase: 68 U/L (ref 38–126)
Anion gap: 8 (ref 5–15)
BUN: 19 mg/dL (ref 8–23)
CO2: 22 mmol/L (ref 22–32)
Calcium: 9.3 mg/dL (ref 8.9–10.3)
Chloride: 110 mmol/L (ref 98–111)
Creatinine, Ser: 0.85 mg/dL (ref 0.61–1.24)
GFR calc Af Amer: >60 mL/min (ref >60)
GFR calc non Af Amer: >60 mL/min (ref >60)
Glucose, Bld: 100 mg/dL — ABNORMAL HIGH (ref 70–99)
Potassium: 3.6 mmol/L (ref 3.5–5.1)
Sodium: 140 mmol/L (ref 135–145)
Total Bilirubin: 1.7 mg/dL — ABNORMAL HIGH (ref 0.3–1.2)
Total Protein: 5.8 g/dL — ABNORMAL LOW (ref 6.5–8.1)

## 2020-04-02 LAB — GLUCOSE, CAPILLARY
Glucose-Capillary: 94 mg/dL (ref 70–99)
Glucose-Capillary: 92 mg/dL (ref 70–99)
Glucose-Capillary: 93 mg/dL (ref 70–99)
Glucose-Capillary: 106 mg/dL — ABNORMAL HIGH (ref 70–99)

## 2020-04-02 MED ORDER — POTASSIUM CHLORIDE IN NACL 20-0.9 MEQ/L-% IV SOLN
INTRAVENOUS | Status: DC
  Filled 2020-04-02 (×2): qty 1000

## 2020-04-02 NOTE — Progress Notes (Signed)
Physical Therapy Session Note  Patient Details  Name: Ethan Fitzgerald MRN: [id number] Date of Birth: 1955-10-21  Today's Date: 04/02/2020 PT Individual Time: 1004-1032 PT Individual Time Calculation (min): 28 min   Short Term Goals: Week 1:  PT Short Term Goal 1 (Week 1): = to LTGs based on ELOS  Skilled Therapeutic Interventions/Progress Updates:   Pt received sitting in w/c and agreeable to therapy session. Donned tennis shoes with mod assist and encouragement for increased use of L UE to complete task with pt having impaired coordination, sensation, and strength in L hand with difficulty grasping shoe strings. Sit<>stands, no AD, with CGA for steadying. Gait ~290ft to main therapy gym, no AD, with CGA and intermittent min assist for balance - demos reciprocal stepping pattern with intermittent decreased L LE foot clearance but no other significant gait impairments noted. Participated in Functional Gait Assessment - details below demonstrating increased fall risk with most impairments noted during head turns, tandem walking, and backwards walking. Gait ~258ft back to room, no AD, with same assist as above. Pt ambulated in room, no AD, to grasp pillow from closet and place on bed with CGA/min assist for steadying. Sit>supine supervision. Pt left supine in bed with needs in reach and bed alarm on.  Therapy Documentation Precautions:  Precautions Precautions: Fall, Other (comment) Precaution Comments: impulsive; Lt inattention  Restrictions Weight Bearing Restrictions: (P) No  Pain:   No reports of pain throughout session.  Balance: Standardized Balance Assessment Standardized Balance Assessment: Functional Gait Assessment Functional Gait  Assessment Gait assessed : Yes Gait Level Surface: Walks 20 ft in less than 7 sec but greater than 5.5 sec, uses assistive device, slower speed, mild gait deviations, or deviates 6-10 in outside of the 12 in walkway width. Change in Gait Speed: Able to  change speed, demonstrates mild gait deviations, deviates 6-10 in outside of the 12 in walkway width, or no gait deviations, unable to achieve a major change in velocity, or uses a change in velocity, or uses an assistive device. Gait with Horizontal Head Turns: Performs head turns with moderate changes in gait velocity, slows down, deviates 10-15 in outside 12 in walkway width but recovers, can continue to walk. (scissoring) Gait with Vertical Head Turns: Performs task with moderate change in gait velocity, slows down, deviates 10-15 in outside 12 in walkway width but recovers, can continue to walk. Gait and Pivot Turn: Pivot turns safely in greater than 3 sec and stops with no loss of balance, or pivot turns safely within 3 sec and stops with mild imbalance, requires small steps to catch balance. (moves with decreased speed to maintain balance) Step Over Obstacle: Is able to step over one shoe box (4.5 in total height) without changing gait speed. No evidence of imbalance. Gait with Narrow Base of Support: Ambulates less than 4 steps heel to toe or cannot perform without assistance. (requires assist to prevent LOB) Gait with Eyes Closed: Cannot walk 20 ft without assistance, severe gait deviations or imbalance, deviates greater than 15 in outside 12 in walkway width or will not attempt task. (requires assist to maintian balance) Ambulating Backwards: Walks 20 ft, slow speed, abnormal gait pattern, evidence for imbalance, deviates 10-15 in outside 12 in walkway width. Steps: Alternating feet, must use rail. Total Score: 13    Therapy/Group: Individual Therapy  Tawana Scale , PT, DPT, CSRS  04/02/2020, 12:44 PM

## 2020-04-02 NOTE — Evaluation (Signed)
Occupational Therapy Assessment and Plan  Patient Details  Name: Ethan Fitzgerald MRN: [id number] Date of Birth: Jan 17, 1956  OT Diagnosis: abnormal posture, cognitive deficits, disturbance of vision, hemiplegia affecting dominant side, muscle weakness (generalized) and coordination disorder Rehab Potential: Rehab Potential (ACUTE ONLY): Good ELOS: 7-10 days   Today's Date: 04/02/2020 OT Individual Time: 1430-1530 OT Individual Time Calculation (min): 60 min     Hospital Problem: Active Problems:   Acute right MCA stroke [facility])   Past Medical History:  Past Medical History:  Diagnosis Date  . COPD (chronic obstructive pulmonary disease) (HCC)    mild  . COVID-19 09/2019  . Erectile dysfunction   . Headache    daily, stress headaches  . Knee pain, left   . Seasonal allergies   . Tobacco dependence    Past Surgical History:  Past Surgical History:  Procedure Laterality Date  . BACK SURGERY     metal plate in back  . COLONOSCOPY    . COLONOSCOPY WITH PROPOFOL N/A 04/26/2019   Procedure: COLONOSCOPY WITH PROPOFOL;  Surgeon: Jonathon Bellows, MD;  Location: [facility] ENDOSCOPY;  Service: Gastroenterology;  Laterality: N/A;  . HERNIA REPAIR Right   . IR ANGIO INTRA EXTRACRAN SEL COM CAROTID INNOMINATE UNI L MOD SED  03/27/2020  . IR ANGIO VERTEBRAL SEL SUBCLAVIAN INNOMINATE UNI L MOD SED  03/27/2020  . IR ANGIO VERTEBRAL SEL VERTEBRAL UNI R MOD SED  03/27/2020  . IR CT HEAD LTD  03/27/2020  . IR PERCUTANEOUS ART THROMBECTOMY/INFUSION INTRACRANIAL INC DIAG ANGIO  03/27/2020  . KNEE ARTHROSCOPY Left 03/28/2015   Procedure: Arthroscopic partial medial meniscectomy plus chondral debridement;  Surgeon: Leanor Kail, MD;  Location: [place];  Service: Orthopedics;  Laterality: Left;  . RADIOLOGY WITH ANESTHESIA N/A 03/27/2020   Procedure: IR WITH ANESTHESIA;  Surgeon: Luanne Bras, MD;  Location: [place];  Service: Radiology;  Laterality: N/A;    Assessment & Plan Clinical  Impression: Patient is a 64 y.o. year old male with history of COPD, daily HA, dyslipidemia, recent bronchitis with coughing episodes who was admitted on 03/27/20 with left sided weakness,difficulty walking and difficulty speaking. CT head showed dense M1 segment with occlusive thrombus, occlusion of left ICA age-indeterminate distal reconstitution of cavernous segment and good perfusion of left-MCA and bilateral-ACAs. He received TPA and underwent cerebral angio with partial revascularization of R-MCA dominant division as well as attempts at revascularization balloon angioplasty of acutely occluded proximal right ICA with reocclusion. Follow-up MRI brain showed acute cortical/subcortical right-MCA infarct, additional small infarcts in right caudate nucleus, right pre and postcentral gyri and mid to anterior frontal lobe, SAH and incidental findings of right parotid neoplasm. Follow-up CT head showed an change in right MCA infarct and stable SAH. 2D echo was technically difficult but showed EF of 55 to 60%.  He tolerated extubation on 07/23 and due to moderate oropharyngeal dysphagia has been advanced to dysphagia 3 with honey liquids. Dr. Leonie Man question that stroke was due to dissection of large vessel from coughing episodes versus hypercoagulable state from parotid neoplasm. Patient on ASA alone due to small SAH with recommendations to repeat CT head on 7/30 and consider DAPT for 3 months if SAH resolved. Blood pressure goals of 130-150 due to intracranial stenosis. Patient has been reporting right shoulder pain as well as fall prior to admission and x-rays done today negative for fracture or injury. Therapy ongoing and patient limited by left inattention with impulsivity, left-sided weakness and balance deficits affecting mobility and ADLs.  CIR was recommended due to functional decline .  Patient transferred to CIR on 04/01/2020 .    Patient currently requires min with basic self-care skills and  IADL secondary to muscle weakness, decreased cardiorespiratoy endurance, decreased coordination and decreased motor planning, L inattention, decreased attention to left, decreased attention, decreased awareness, decreased problem solving, decreased safety awareness and delayed processing and decreased standing balance, decreased postural control, hemiplegia and decreased balance strategies.  Prior to hospitalization, patient could complete ADLs and IADLs with independent .  Patient will benefit from skilled intervention to increase independence with basic self-care skills prior to discharge home with care partner.  Anticipate patient will require 24 hour supervision and follow up home health.  OT - End of Session Activity Tolerance: Decreased this session Endurance Deficit: Yes Endurance Deficit Description: multiple rest breaks secondary to fatigue OT Assessment Rehab Potential (ACUTE ONLY): Good OT Barriers to Discharge: Other (comments) OT Barriers to Discharge Comments: none known at this time OT Patient demonstrates impairments in the following area(s): Balance;Behavior;Cognition;Endurance;Motor;Safety;Sensory;Vision OT Basic ADL's Functional Problem(s): Grooming;Bathing;Dressing;Toileting;Eating OT Advanced ADL's Functional Problem(s): Simple Meal Preparation OT Transfers Functional Problem(s): Toilet;Tub/Shower OT Additional Impairment(s): Fuctional Use of Upper Extremity OT Plan OT Intensity: Minimum of 1-2 x/day, 45 to 90 minutes OT Frequency: 5 out of 7 days OT Duration/Estimated Length of Stay: 7-10 days OT Treatment/Interventions: Balance/vestibular training;Self Care/advanced ADL retraining;UE/LE Coordination activities;Cognitive remediation/compensation;Functional mobility training;Visual/perceptual remediation/compensation;Community reintegration;Neuromuscular re-education;Wheelchair propulsion/positioning;Discharge planning;Therapeutic Activities;Patient/family  education;Therapeutic Exercise;DME/adaptive equipment instruction;Psychosocial support;UE/LE Strength taining/ROM OT Self Feeding Anticipated Outcome(s): S OT Basic Self-Care Anticipated Outcome(s): S OT Toileting Anticipated Outcome(s): S OT Bathroom Transfers Anticipated Outcome(s): S OT Recommendation Recommendations for Other Services: Neuropsych consult Patient destination: Home Follow Up Recommendations: Outpatient OT;24 hour supervision/assistance Equipment Recommended: To be determined   OT Evaluation Precautions/Restrictions  Precautions Precautions: Fall;Other (comment) Precaution Comments: impulsive; Lt inattention  General   Vital Signs Therapy Vitals Pulse Rate: 69 Resp: 20 BP: (!) 121/87 Patient Position (if appropriate): Lying Oxygen Therapy SpO2: 96 % O2 Device: Room Air Pain Pain Assessment Pain Scale: 0-10 Pain Score: 0-No pain Home Living/Prior Functioning Home Living Family/patient expects to be discharged to:: Private residence Living Arrangements: Spouse/significant other Available Help at Discharge: Family, Available 24 hours/day Type of Home: House Home Access: Stairs to enter [employer] of Steps: 3 Entrance Stairs-Rails: Right, Left, Can reach both Home Layout: One level Bathroom Shower/Tub: Multimedia programmer: Handicapped height Bathroom Accessibility: Yes Additional Comments: Pt lives with spouse, who is supportive and who is retired   Lives With: Spouse (wife, joan) Prior Function Level of Independence: Independent with gait, Independent with transfers, Independent with homemaking with ambulation, Independent with basic ADLs  Able to Take Stairs?: Reciprically Driving: Yes Vocation: Full time employment 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 [employer] Comments: access to RW from pt's father Vision Baseline Vision/History: Wears glasses Wears Glasses: Reading only Patient  Visual Report: No change from baseline Vision Assessment?: Yes Eye Alignment: Within Functional Limits Ocular Range of Motion: Within Functional Limits Alignment/Gaze Preference: Within Defined Limits Tracking/Visual Pursuits: Able to track stimulus in all quads without difficulty Saccades: Within functional limits Visual Fields: No apparent deficits Perception  Perception: Impaired Inattention/Neglect: Does not attend to left side of body;Does not attend to left visual field Praxis Praxis: Intact Cognition Overall Cognitive Status: Impaired/Different from baseline Arousal/Alertness: Awake/alert Orientation Level: Person;Place;Situation Person: Oriented Place: Oriented Situation: Oriented Year: 2021 Month: July Day of Week: Correct Memory: Appears intact Immediate Memory Recall: Sock;Blue;Bed  Memory Recall Sock: Without Cue Memory Recall Blue: Without Cue Memory Recall Bed: Without Cue Attention: Focused;Sustained Focused Attention: Appears intact Sustained Attention: Appears intact Awareness: Appears intact Problem Solving: Appears intact Behaviors: Impulsive Safety/Judgment: Impaired Sensation Sensation Light Touch: Appears Intact Hot/Cold: Appears Intact Proprioception: Impaired by gross assessment;Appears Intact Stereognosis: Not tested Coordination Gross Motor Movements are Fluid and Coordinated: No Fine Motor Movements are Fluid and Coordinated: No Coordination and Movement Description: gross motor movements impaired due to L hemiparesis Heel Shin Test: symmetrical Motor  Motor Motor: Other (comment) Motor - Skilled Clinical Observations: mild L hemiparesis, L inattention  Trunk/Postural Assessment  Cervical Assessment Cervical Assessment: Exceptions to [facility] (forward head) Thoracic Assessment Thoracic Assessment: Exceptions to [facility] (rounded shoulders) Lumbar Assessment Lumbar Assessment: Exceptions to [facility] Postural Control Postural Control:  (posterior  pelvic tilt) Postural Limitations: mildly decreased  Balance Balance Balance Assessed: Yes Static Sitting Balance Static Sitting - Balance Support: Feet supported Static Sitting - Level of Assistance: 5: Stand by assistance Dynamic Sitting Balance Dynamic Sitting - Level of Assistance: 5: Stand by assistance Static Standing Balance Static Standing - Balance Support: During functional activity Static Standing - Level of Assistance: 5: Stand by assistance Dynamic Standing Balance Dynamic Standing - Balance Support: During functional activity Dynamic Standing - Level of Assistance: 4: Min assist Extremity/Trunk Assessment RUE Assessment RUE Assessment: Within Functional Limits LUE Assessment LUE Assessment: Exceptions to [facility] Passive Range of Motion (PROM) Comments: WFLs Active Range of Motion (AROM) Comments: WFLs General Strength Comments: 3+/5  Care Tool Care Tool Self Care Eating        Oral Care    Oral Care Assist Level: Contact Guard/Toucning assist (standing)    Bathing   Body parts bathed by patient: Right arm;Left arm;Chest;Abdomen;Front perineal area;Buttocks;Right upper leg;Left upper leg;Right lower leg;Face     Assist Level: Minimal Assistance - Patient > 75%    Upper Body Dressing(including orthotics)   What is the patient wearing?: Pull over shirt   Assist Level: Set up assist    Lower Body Dressing (excluding footwear)   What is the patient wearing?: Underwear/pull up;Pants Assist for lower body dressing: Contact Guard/Touching assist    Putting on/Taking off footwear   What is the patient wearing?: Socks;Shoes Assist for footwear: Contact Guard/Touching assist       Care Tool Toileting Toileting activity   Assist for toileting: Minimal Assistance - Patient > 75%     Care Tool Bed Mobility Roll left and right activity   Roll left and right assist level: Supervision/Verbal cueing    Sit to lying activity   Sit to lying assist level: Contact  Guard/Touching assist    Lying to sitting edge of bed activity   Lying to sitting edge of bed assist level: Contact Guard/Touching assist     Care Tool Transfers Sit to stand transfer   Sit to stand assist level: Minimal Assistance - Patient > 75%    Chair/bed transfer   Chair/bed transfer assist level: Minimal Assistance - Patient > 75%     Toilet transfer   Assist Level: Minimal Assistance - Patient > 75%     Care Tool Cognition Expression of Ideas and Wants Expression of Ideas and Wants: Without difficulty (complex and basic) - expresses complex messages without difficulty and with speech that is clear and easy to understand   Understanding Verbal and Non-Verbal Content Understanding Verbal and Non-Verbal Content: Understands (complex and basic) - clear comprehension without cues or repetitions   Memory/Recall Ability *first  3 days only Memory/Recall Ability *first 3 days only: Current season;Location of own room;That he or she is in a hospital/hospital unit    Refer to Care Plan for [place] 1 OT Short Term Goal 1 (Week 1): STG=LTGs secondary to short estimated LOS  Recommendations for other services: Neuropsych   Skilled Therapeutic Intervention  Upon entering the room, pt supine in bed with no c/o pain and agreeable to OT intervention. OT covered IV for shower. Pt ambulating without use of RW with min A into bathroom and doffing clothing before seated on TTB for bathing. Pt engaged in sit <>stand for bathing tasks with min A overall. Pt utilizing L UE to wash self and was able to hold cloth without drops. Pt exiting the bathroom and seated on EOB to don clothing items. Pt able to tie shoelaces with increased time for coordination. Pt returning to bed at end of session with call bell and all needed items within reach.  Mobility   Bed Mobility Bed Mobility: Supine to Sit;Sit to Supine Supine to Sit: Supervision/Verbal cueing Sit to Supine:  Supervision/Verbal cueing Transfers Sit to Stand: Minimal Assistance - Patient > 75%;Contact Guard/Touching assist Stand to Sit: Minimal Assistance - Patient > 75%;Contact Guard/Touching assist   Discharge Criteria: Patient will be discharged from OT if patient refuses treatment 3 consecutive times without medical reason, if treatment goals not met, if there is a change in medical status, if patient makes no progress towards goals or if patient is discharged from hospital.  The above assessment, treatment plan, treatment alternatives and goals were discussed and mutually agreed upon: by patient  Gypsy Decant 04/02/2020, 4:46 PM

## 2020-04-02 NOTE — H&P (Signed)
Physical Medicine and Rehabilitation Admission H&P        Chief Complaint  Patient presents with  . Stroke with functional deficits.       HPI:  Ethan Fitzgerald. Jesson is a 64 year old LH-male with history of COPD, daily HA, dyslipidemia, recent bronchitis with coughing episodes who was admitted on 03/27/20 with left sided weakness, difficulty walking and difficulty speaking.  CT head showed dense M1 segment with occlusive thrombus, occlusion of left ICA age-indeterminate distal reconstitution of cavernous segment and good perfusion of left-MCA and bilateral-ACAs.  He received TPA and underwent cerebral angio with partial revascularization of  R-MCA dominant division as well as attempts at revascularization balloon angioplasty of acutely occluded proximal right ICA with reocclusion.  Follow-up MRI brain showed acute cortical/subcortical right-MCA infarct, additional small infarcts in right caudate nucleus, right pre and postcentral gyri and mid to anterior frontal lobe, SAH and incidental findings of right parotid neoplasm.  Follow-up CT head showed an change in right MCA infarct and stable SAH.  2D echo was technically difficult but showed EF of 55 to 60%.   He tolerated extubation on 07/23 and due to moderate oropharyngeal dysphagia has been advanced to dysphagia 3 with honey liquids.  Dr. Leonie Man question that stroke was due to dissection of large vessel from coughing episodes versus hypercoagulable state from parotid neoplasm.  Patient on ASA alone due to small SAH with recommendations to repeat CT head on 7/30 and consider DAPT for 3 months if SAH resolved.  Blood pressure goals of 130-150 due to intracranial stenosis.  Patient has been reporting right shoulder pain as well as fall prior to admission and x-rays done today negative for fracture or injury.  Therapy ongoing and patient limited by left inattention with impulsivity, left-sided weakness and balance deficits affecting mobility and ADLs.  CIR  was recommended due to functional decline.     Wife reports had uncontrolled BM 2 nights ago- voiding OK. Adequate appetite- getting better- eats slow.        Review of Systems  Constitutional: Negative for chills and fever.  HENT: Negative for hearing loss and tinnitus.   Eyes: Negative for blurred vision and double vision.  Respiratory: Positive for cough. Negative for shortness of breath.   Cardiovascular: Negative for chest pain and palpitations.  Gastrointestinal: Positive for constipation (No BM this week). Negative for abdominal pain, heartburn and nausea.  Genitourinary: Positive for frequency. Negative for dysuria.  Musculoskeletal: Positive for joint pain (sore left shoulder ).  Skin: Negative for itching and rash.  Neurological: Positive for speech change, weakness and headaches. Negative for dizziness.  Psychiatric/Behavioral: The patient has insomnia.   All other systems reviewed and are negative.         Past Medical History:  Diagnosis Date  . COPD (chronic obstructive pulmonary disease) (HCC)      mild  . COVID-19 09/2019  . Erectile dysfunction    . Headache      daily, stress headaches  . Knee pain, left    . Seasonal allergies    . Tobacco dependence             Past Surgical History:  Procedure Laterality Date  . BACK SURGERY        metal plate in back  . COLONOSCOPY      . COLONOSCOPY WITH PROPOFOL N/A 04/26/2019    Procedure: COLONOSCOPY WITH PROPOFOL;  Surgeon: Jonathon Bellows, MD;  Location: [facility] ENDOSCOPY;  Service:  Gastroenterology;  Laterality: N/A;  . HERNIA REPAIR Right    . IR ANGIO INTRA EXTRACRAN SEL COM CAROTID INNOMINATE UNI L MOD SED   03/27/2020  . IR ANGIO VERTEBRAL SEL SUBCLAVIAN INNOMINATE UNI L MOD SED   03/27/2020  . IR ANGIO VERTEBRAL SEL VERTEBRAL UNI R MOD SED   03/27/2020  . IR CT HEAD LTD   03/27/2020  . IR PERCUTANEOUS ART THROMBECTOMY/INFUSION INTRACRANIAL INC DIAG ANGIO   03/27/2020  . KNEE ARTHROSCOPY Left 03/28/2015     Procedure: Arthroscopic partial medial meniscectomy plus chondral debridement;  Surgeon: Leanor Kail, MD;  Location: [place];  Service: Orthopedics;  Laterality: Left;  . RADIOLOGY WITH ANESTHESIA N/A 03/27/2020    Procedure: IR WITH ANESTHESIA;  Surgeon: Luanne Bras, MD;  Location: [place];  Service: Radiology;  Laterality: N/A;           Family History  Problem Relation Age of Onset  . Diabetes Mother    . Heart attack Mother 67  . Cancer Father          liver   . COPD Brother    . HIV/AIDS Brother    . Prostate cancer Neg Hx    . Colon cancer Neg Hx        Social History:  Married. 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  He reports that he quit smoking about 2 years ago. His smoking use included cigarettes. He started smoking about 51 years ago. He has a 73.50 pack-year smoking history. He has quit using smokeless tobacco. He reports current alcohol use--6 pack on weekends. He reports that he does not use drugs.      Allergies: No Known Allergies            Medications Prior to Admission  Medication Sig Dispense Refill  . albuterol (VENTOLIN HFA) 108 (90 Base) MCG/ACT inhaler Inhale 1-2 puffs into the lungs every 4 (four) hours as needed for wheezing or shortness of breath. 18 g 2  . Aspirin-Acetaminophen-Caffeine (EXCEDRIN PO) Take 2 tablets by mouth. am      . guaiFENesin-codeine (ROBITUSSIN AC) 100-10 MG/5ML syrup Take 5-10 mLs by mouth 3 (three) times daily as needed for cough. 180 mL 0  . Omega-3 Fatty Acids (FISH OIL) 1000 MG CAPS Take 2,000 mg by mouth in the morning and at bedtime.      . predniSONE (DELTASONE) 50 MG tablet Take 1 tablet (50 mg total) by mouth daily with breakfast. 5 tablet 0  . rosuvastatin (CRESTOR) 10 MG tablet Take 2 tablets (20 mg total) by mouth daily. 90 tablet 1  . Tiotropium Bromide-Olodaterol (STIOLTO RESPIMAT) 2.5-2.5 MCG/ACT AERS Inhale 2 puffs into the lungs daily. 4 g 6  . vitamin C (ASCORBIC ACID) 500  MG tablet Take 500 mg by mouth daily. Am      . azithromycin (ZITHROMAX Z-PAK) 250 MG tablet Take 2 tabs (500mg  total) on Day 1. Take 1 tab (250mg ) daily for next 4 days. (Patient not taking: Reported on 03/27/2020) 6 tablet 0  . cyclobenzaprine (FLEXERIL) 10 MG tablet Take 1 tablet (10 mg total) by mouth 3 (three) times daily as needed for muscle spasms. (Patient not taking: Reported on 03/27/2020) 60 tablet 1  . naproxen (NAPROSYN) 500 MG tablet TAKE 1 TABLET (500 MG TOTAL) BY MOUTH 2 (TWO) TIMES DAILY WITH A MEAL. FOR 2-4 WEEKS THEN AS NEEDED (Patient not taking: Reported on 03/27/2020) 60 tablet 2  . sildenafil (REVATIO) 20  MG tablet TAKE 1 TO 5 TABLETS BY MOUTH ABOUT 30 MINUTES PRIOR TO SEX.START WITH 1 THEN INCREASE (Patient not taking: Reported on 03/27/2020) 30 tablet 5      Drug Regimen Review  Drug regimen was reviewed and remains appropriate with no significant issues identified   Home: Home Living Family/patient expects to be discharged to:: Private residence Living Arrangements: Spouse/significant other Available Help at Discharge: Family Type of Home: House Home Access: Stairs to enter Technical brewer of Steps: 1 [place]: One level Bathroom Shower/Tub: Multimedia programmer: Handicapped height Home Equipment: Civil engineer, contracting Additional Comments: Pt lives with spouse, who is supportive and who is retired   Lives With: Spouse   Functional History: Prior Function Level of Independence: Independent Comments: 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 [place] Status:  Mobility: Bed Mobility Overal bed mobility: Needs Assistance Bed Mobility: Supine to Sit Rolling: Supervision Supine to sit: Min guard Sit to supine: Min guard, HOB elevated General bed mobility comments: not attending to his condom catheter as coming to sit Transfers Overall transfer level: Needs assistance Equipment used:  Rolling walker (2 wheeled) Transfers: Sit to/from Stand Sit to Stand: Min guard Stand pivot transfers: Min assist General transfer comment: from EOB and from chair with armrests; vc for sequencing with RW (tries to pull up on RW) Ambulation/Gait Ambulation/Gait assistance: Min assist Gait Distance (Feet): 150 Feet (x2 (with and without RW)) Assistive device: None, Rolling walker (2 wheeled) Gait Pattern/deviations: Step-through pattern, Decreased stride length, Decreased weight shift to left, Decreased dorsiflexion - left, Drifts right/left General Gait Details: pt stumbled due to dragging left foot x 2 with LOB when not using RW; reports he feels more unsteady today and prefers to use RW; with RW he ran into objects on his left x 2 with imbalance; required repeated cues to look to his left Gait velocity: faster than is safe for his inattention to left Gait velocity interpretation: <1.8 ft/sec, indicate of risk for recurrent falls Stairs: Yes Stairs assistance: Min guard Stair Management: One rail Right, Step to pattern, Forwards Number of Stairs: 2 General stair comments: instructed to step up with left leg (weaker) and he could but also used rail   ADL: ADL Overall ADL's : Needs assistance/impaired Eating/Feeding: Minimal assistance, Sitting Eating/Feeding Details (indicate cue type and reason): requires verbal cues for precautions, assist for set up, and assist for organization and problem solving  Grooming: Minimal assistance, Cueing for sequencing, Cueing for safety, Standing, Wash/dry hands Grooming Details (indicate cue type and reason): Min A with min guard for safety but mod multimodal cues for locating objects on L side when washing hands Upper Body Bathing: Moderate assistance, Sitting Lower Body Bathing: Minimal assistance, Sit to/from stand Upper Body Dressing : Moderate assistance, Sitting Lower Body Dressing: Moderate assistance, Sit to/from stand Toilet Transfer:  Ambulation, RW, Minimal assistance, Cueing for sequencing Toilet Transfer Details (indicate cue type and reason): simulated to recliner with Min A for balance and verbal cues for seqeuncing Toileting- Clothing Manipulation and Hygiene: Moderate assistance, Sit to/from stand Toileting - Clothing Manipulation Details (indicate cue type and reason): Per nsg staff, pt has been incontinent of stool multiple times  Functional mobility during ADLs: Minimal assistance, Rolling walker, Cueing for safety, Cueing for sequencing General ADL Comments: Pt with inattention to L side and needing verbal cues for safety and sequencing due to deficits on L side.    Cognition:  Cognition Overall Cognitive Status: Impaired/Different from baseline Arousal/Alertness: Lethargic Orientation Level: Oriented X4 Attention: Focused, Sustained Focused Attention: Appears intact Sustained Attention: Appears intact Immediate Memory Recall: Sheila Oats, Bed Memory Recall Sock: Without Cue Memory Recall Blue: Without Cue Memory Recall Bed: Not able to recall Awareness: Appears intact Problem Solving: Appears intact Safety/Judgment: Appears intact Cognition Arousal/Alertness: Awake/alert Behavior During Therapy: Impulsive Overall Cognitive Status: Impaired/Different from baseline Area of Impairment: Attention, Memory, Safety/judgement, Awareness, Problem solving Current Attention Level: Sustained Memory: Decreased recall of precautions, Decreased short-term memory Following Commands: Follows one step commands consistently, Follows multi-step commands inconsistently Safety/Judgement: Decreased awareness of safety, Decreased awareness of deficits Awareness: Emergent Problem Solving: Slow processing, Difficulty sequencing, Requires verbal cues, Requires tactile cues General Comments: Ran into objects on his left x 2 when using RW and then recognized needed to move to his right to pass by object; continued left inattention      Blood pressure (!) 117/89, pulse 78, temperature 98.2 F (36.8 C), temperature source Oral, resp. rate 18, height 6' (1.829 m), weight 78.9 kg, SpO2 97 %. Physical Exam Vitals and nursing note reviewed.  Constitutional:      Appearance: Normal appearance.     Comments: Awake, alert, poor short term memory, wife at bedside, sitting in recliner, NAD  HENT:     Head: Normocephalic and atraumatic.     Comments: L facial droop Coated tongue- is midline    Right Ear: External ear normal.     Left Ear: External ear normal.     Nose: Nose normal. No congestion.     Mouth/Throat:     Mouth: Mucous membranes are moist.     Pharynx: Oropharynx is clear.  Eyes:     Comments: R gaze preference, however can look Left/laterally if asked- no nystagmus seen; EOMI B/L  Cardiovascular:     Comments: RRR- no M/R/G Pulmonary:     Breath sounds: Examination of the left-middle field reveals wheezing. Examination of the left-lower field reveals wheezing. Wheezing present.     Comments: Coarse breath sounds- good air movement- B/L Abdominal:     Tenderness: There is no abdominal tenderness.     Comments: Soft, NT, ND, (+)BS hypoactive  Musculoskeletal:     Cervical back: Normal range of motion and neck supple. No rigidity.     Comments: RUE- biceps, triceps, WE, grip and finger abd 5/5 LUE_ biceps 4+/5, triceps 4+/5, WE 4/5, grip 4-/5 and finger abd 4-/5 RLE- 5/5 in HF, KF< KE, DF and PF LLE- HF 4/5, KE/KF 5-/5, DF and PF 5/5  Skin:    General: Skin is warm and dry.     Comments: Bruise L elbow- fading  Neurological:     Mental Status: He is alert and oriented to person, place, and time.     Comments: Left facial weakness with mild to moderate dysarthria. Mild left inattention but scans to the left without cues.   L inattention- light touch intact in all 4 extremities and face Knew why here, year, month, but not exact date/day; knew next holiday/president, etc- however didn't remember when had  BM  Psychiatric:     Comments: Quiescent; otherwise appropriate        Lab Results Last 48 Hours        Results for orders placed or performed during the hospital encounter of 03/27/20 (from the past 48 hour(s))  Glucose, capillary     Status: None    Collection Time: 03/30/20 12:30 PM  Result Value  Ref Range    Glucose-Capillary 92 70 - 99 mg/dL      Comment: Glucose reference range applies only to samples taken after fasting for at least 8 hours.  Glucose, capillary     Status: None    Collection Time: 03/30/20  5:24 PM  Result Value Ref Range    Glucose-Capillary 94 70 - 99 mg/dL      Comment: Glucose reference range applies only to samples taken after fasting for at least 8 hours.  Glucose, capillary     Status: None    Collection Time: 03/30/20  8:13 PM  Result Value Ref Range    Glucose-Capillary 92 70 - 99 mg/dL      Comment: Glucose reference range applies only to samples taken after fasting for at least 8 hours.  Glucose, capillary     Status: None    Collection Time: 03/30/20 11:22 PM  Result Value Ref Range    Glucose-Capillary 94 70 - 99 mg/dL      Comment: Glucose reference range applies only to samples taken after fasting for at least 8 hours.  Glucose, capillary     Status: None    Collection Time: 03/31/20  3:07 AM  Result Value Ref Range    Glucose-Capillary 82 70 - 99 mg/dL      Comment: Glucose reference range applies only to samples taken after fasting for at least 8 hours.  CBC     Status: Abnormal    Collection Time: 03/31/20  3:25 AM  Result Value Ref Range    WBC 8.2 4.0 - 10.5 K/uL    RBC 4.04 (L) 4.22 - 5.81 MIL/uL    Hemoglobin 13.2 13.0 - 17.0 g/dL    HCT 38.1 (L) 39 - 52 %    MCV 94.3 80.0 - 100.0 fL    MCH 32.7 26.0 - 34.0 pg    MCHC 34.6 30.0 - 36.0 g/dL    RDW 12.5 11.5 - 15.5 %    Platelets 239 150 - 400 K/uL    nRBC 0.0 0.0 - 0.2 %      Comment: Performed at [facility], [place] [address] [place], [place] [address]  Basic  metabolic panel     Status: Abnormal    Collection Time: 03/31/20  3:25 AM  Result Value Ref Range    Sodium 135 135 - 145 mmol/L    Potassium 3.6 3.5 - 5.1 mmol/L    Chloride 107 98 - 111 mmol/L    CO2 21 (L) 22 - 32 mmol/L    Glucose, Bld 99 70 - 99 mg/dL      Comment: Glucose reference range applies only to samples taken after fasting for at least 8 hours.    BUN 13 8 - 23 mg/dL    Creatinine, Ser 0.83 0.61 - 1.24 mg/dL    Calcium 8.8 (L) 8.9 - 10.3 mg/dL    GFR calc non Af Amer >60 >60 mL/min    GFR calc Af Amer >60 >60 mL/min    Anion gap 7 5 - 15      Comment: Performed at [facility] [address], [place], [place] [address]  Glucose, capillary     Status: Abnormal    Collection Time: 03/31/20  7:47 AM  Result Value Ref Range    Glucose-Capillary 108 (H) 70 - 99 mg/dL      Comment: Glucose reference range applies only to samples taken after fasting for at least 8  hours.  Glucose, capillary     Status: Abnormal    Collection Time: 03/31/20 12:12 PM  Result Value Ref Range    Glucose-Capillary 107 (H) 70 - 99 mg/dL      Comment: Glucose reference range applies only to samples taken after fasting for at least 8 hours.  Glucose, capillary     Status: None    Collection Time: 03/31/20  4:15 PM  Result Value Ref Range    Glucose-Capillary 93 70 - 99 mg/dL      Comment: Glucose reference range applies only to samples taken after fasting for at least 8 hours.  Glucose, capillary     Status: None    Collection Time: 03/31/20  8:23 PM  Result Value Ref Range    Glucose-Capillary 83 70 - 99 mg/dL      Comment: Glucose reference range applies only to samples taken after fasting for at least 8 hours.  Glucose, capillary     Status: None    Collection Time: 04/01/20  1:34 AM  Result Value Ref Range    Glucose-Capillary 87 70 - 99 mg/dL      Comment: Glucose reference range applies only to samples taken after fasting for at least 8 hours.  CBC     Status: None    Collection  Time: 04/01/20  3:42 AM  Result Value Ref Range    WBC 10.4 4.0 - 10.5 K/uL    RBC 4.30 4.22 - 5.81 MIL/uL    Hemoglobin 14.2 13.0 - 17.0 g/dL    HCT 40.4 39 - 52 %    MCV 94.0 80.0 - 100.0 fL    MCH 33.0 26.0 - 34.0 pg    MCHC 35.1 30.0 - 36.0 g/dL    RDW 12.4 11.5 - 15.5 %    Platelets 276 150 - 400 K/uL    nRBC 0.0 0.0 - 0.2 %      Comment: Performed at [facility], [place] [address], [place], [place] [address]  Basic metabolic panel     Status: None    Collection Time: 04/01/20  3:42 AM  Result Value Ref Range    Sodium 136 135 - 145 mmol/L    Potassium 3.5 3.5 - 5.1 mmol/L    Chloride 106 98 - 111 mmol/L    CO2 22 22 - 32 mmol/L    Glucose, Bld 94 70 - 99 mg/dL      Comment: Glucose reference range applies only to samples taken after fasting for at least 8 hours.    BUN 13 8 - 23 mg/dL    Creatinine, Ser 0.95 0.61 - 1.24 mg/dL    Calcium 9.0 8.9 - 10.3 mg/dL    GFR calc non Af Amer >60 >60 mL/min    GFR calc Af Amer >60 >60 mL/min    Anion gap 8 5 - 15      Comment: Performed at [facility] [address], [place], [place] [address]  Glucose, capillary     Status: None    Collection Time: 04/01/20  4:14 AM  Result Value Ref Range    Glucose-Capillary 97 70 - 99 mg/dL      Comment: Glucose reference range applies only to samples taken after fasting for at least 8 hours.  Glucose, capillary     Status: None    Collection Time: 04/01/20  7:58 AM  Result Value Ref Range    Glucose-Capillary 95 70 - 99 mg/dL  Comment: Glucose reference range applies only to samples taken after fasting for at least 8 hours.    Comment 1 Notify RN      Comment 2 Document in Chart         Imaging Results (Last 48 hours)  DG Shoulder Left Port   Result Date: 03/31/2020 CLINICAL DATA:  Shoulder pain after fall EXAM: LEFT SHOULDER COMPARISON:  None. FINDINGS: There is no evidence of fracture or dislocation. There is no evidence of arthropathy or other focal bone abnormality.  Soft tissues are unremarkable. IMPRESSION: Negative. Electronically Signed   By: Donavan Foil M.D.   On: 03/31/2020 19:42    DG Swallowing Func-Speech Pathology   Result Date: 03/30/2020 Objective Swallowing Evaluation: Type of Study: MBS-Modified Barium Swallow Study  Patient Details Name: Ethan Fitzgerald MRN: [id number] Date of Birth: [birth year] Today's Date: 03/30/2020 Time: SLP Start Time (ACUTE ONLY): 1145 -SLP Stop Time (ACUTE ONLY): 1210 SLP Time Calculation (min) (ACUTE ONLY): 25 min Past Medical History: Past Medical History: Diagnosis Date . COPD (chronic obstructive pulmonary disease) (HCC)   mild . Erectile dysfunction  . Headache   daily, stress headaches . Knee pain, left  . Seasonal allergies  . Tobacco dependence  Past Surgical History: Past Surgical History: Procedure Laterality Date . BACK SURGERY    metal plate in back . COLONOSCOPY   . COLONOSCOPY WITH PROPOFOL N/A 04/26/2019  Procedure: COLONOSCOPY WITH PROPOFOL;  Surgeon: Jonathon Bellows, MD;  Location: [facility] ENDOSCOPY;  Service: Gastroenterology;  Laterality: N/A; . HERNIA REPAIR Right  . KNEE ARTHROSCOPY Left 03/28/2015  Procedure: Arthroscopic partial medial meniscectomy plus chondral debridement;  Surgeon: Leanor Kail, MD;  Location: [place];  Service: Orthopedics;  Laterality: Left; . RADIOLOGY WITH ANESTHESIA N/A 03/27/2020  Procedure: IR WITH ANESTHESIA;  Surgeon: Luanne Bras, MD;  Location: [place];  Service: Radiology;  Laterality: N/A; HPI: 64 y.o. male with history of COPD presenting with L sided weakness.  R MCA infarct s/p tPA and IR with partial revascularization. CT head 7/23: moderate-sized acute right MCA infarct centered in right frontal operculum; Hyperdensity within regional sulci, right sylvian fissure, and interpeduncular cistern may reflect a combination of subarachnoid hemorrhage and contrast.   Subjective: Pt was alert and cooperative Assessment / Plan / Recommendation CHL IP CLINICAL IMPRESSIONS 03/30/2020  Clinical Impression Pt was seen for a modified barium swallow study and he presents with moderate oropharyngeal dysphagia with resultant silent aspiration of thin liquid (PAS 8) and sensed aspiration of nectar-thick liquid (PAS 6).  Aspiration appeared to be secondary to delayed swallow initiation at the pyriform sinus and delayed laryngeal closure.  No laryngeal penetration or aspiration was observed with honey-thick liquid, puree, or regular solids.  Oral phase was remarkable for anterior spillage on the L side with thin liquid trials, reduced mastication of regular solids (at least partially contributable to edentulism), reduced lingual control resulting in premature spillage to the pharynx, and reduced lingual strength resulting in oral residue.  Pharyngeal phase was remarkable for reduced BOT retraction resulting in vallecular residue, reduced hyolaryngeal elevation/excursion resulting in vallecular and pyriform residue, and reduced epiglottic inversion with all liquid trials resulting in vallecular residue.  Pt was cued to take effortful dry swallows after trials in an attempt to reduce pharyngeal residue, but this was only partially successful.  Pharyngoesophageal phase was remarkable for suspected CP bar around the level of C5-C6 and suspected mild osteophytes at C5 and C6.  No esophageal backflow was observed.  Recommend initiation of Dysphagia  1 (puree) solids and honey-thick liquids (no straws) with medications administered crushed in puree.  Pt will benefit from set up and intermittent supervision to cue for the following compensatory strategies: 1) Small bites/sips 2) Effortful dry swallow following each bite/sip 3) Slow rate of intake 4) Sit upright as possible.   SLP Visit Diagnosis Dysphagia, oropharyngeal phase (R13.12) Attention and concentration deficit following -- Frontal lobe and executive function deficit following -- Impact on safety and function Moderate aspiration risk   CHL IP TREATMENT  RECOMMENDATION 03/30/2020 Treatment Recommendations Therapy as outlined in treatment plan below   Prognosis 03/30/2020 Prognosis for Safe Diet Advancement Good Barriers to Reach Goals -- Barriers/Prognosis Comment -- CHL IP DIET RECOMMENDATION 03/30/2020 SLP Diet Recommendations Dysphagia 1 (Puree) solids;Honey thick liquids Liquid Administration via Cup Medication Administration Crushed with puree Compensations Slow rate;Small sips/bites;Effortful swallow;Multiple dry swallows after each bite/sip Postural Changes Seated upright at 90 degrees   CHL IP OTHER RECOMMENDATIONS 03/30/2020 Recommended Consults -- Oral Care Recommendations Oral care BID Other Recommendations Order thickener from pharmacy;Remove water pitcher;Have oral suction available   CHL IP FOLLOW UP RECOMMENDATIONS 03/30/2020 Follow up Recommendations Inpatient Rehab   CHL IP FREQUENCY AND DURATION 03/30/2020 Speech Therapy Frequency (ACUTE ONLY) min 2x/week Treatment Duration 2 weeks      CHL IP ORAL PHASE 03/30/2020 Oral Phase Impaired Oral - Pudding Teaspoon -- Oral - Pudding Cup -- Oral - Honey Teaspoon -- Oral - Honey Cup Delayed oral transit;Lingual/palatal residue Oral - Nectar Teaspoon -- Oral - Nectar Cup Delayed oral transit;Lingual/palatal residue;Premature spillage Oral - Nectar Straw Premature spillage;Delayed oral transit;Lingual/palatal residue Oral - Thin Teaspoon Left anterior bolus loss;Lingual/palatal residue;Delayed oral transit;Decreased bolus cohesion;Premature spillage Oral - Thin Cup Left anterior bolus loss;Premature spillage;Delayed oral transit;Lingual/palatal residue Oral - Thin Straw Left anterior bolus loss;Premature spillage;Delayed oral transit;Lingual/palatal residue Oral - Puree Weak lingual manipulation;Lingual/palatal residue;Delayed oral transit Oral - Mech Soft -- Oral - Regular Impaired mastication;Weak lingual manipulation;Lingual/palatal residue;Delayed oral transit Oral - Multi-Consistency -- Oral - Pill -- Oral  Phase - Comment --  CHL IP PHARYNGEAL PHASE 03/30/2020 Pharyngeal Phase Impaired Pharyngeal- Pudding Teaspoon -- Pharyngeal -- Pharyngeal- Pudding Cup -- Pharyngeal -- Pharyngeal- Honey Teaspoon -- Pharyngeal -- Pharyngeal- Honey Cup Delayed swallow initiation-vallecula;Pharyngeal residue - valleculae;Pharyngeal residue - pyriform;Pharyngeal residue - posterior pharnyx;Reduced pharyngeal peristalsis;Reduced laryngeal elevation;Reduced airway/laryngeal closure;Reduced epiglottic inversion;Reduced tongue base retraction Pharyngeal Material does not enter airway Pharyngeal- Nectar Teaspoon -- Pharyngeal -- Pharyngeal- Nectar Cup Delayed swallow initiation-vallecula;Penetration/Aspiration during swallow;Reduced anterior laryngeal mobility;Reduced laryngeal elevation;Reduced airway/laryngeal closure;Reduced tongue base retraction;Reduced epiglottic inversion;Pharyngeal residue - valleculae;Pharyngeal residue - pyriform Pharyngeal Material enters airway, remains ABOVE vocal cords and not ejected out Pharyngeal- Nectar Straw Delayed swallow initiation-pyriform sinuses;Pharyngeal residue - valleculae;Penetration/Aspiration during swallow;Reduced epiglottic inversion;Reduced anterior laryngeal mobility;Reduced laryngeal elevation;Reduced airway/laryngeal closure;Reduced tongue base retraction;Trace aspiration Pharyngeal Material enters airway, passes BELOW cords then ejected out Pharyngeal- Thin Teaspoon Delayed swallow initiation-pyriform sinuses;Penetration/Aspiration before swallow;Penetration/Aspiration during swallow;Reduced epiglottic inversion;Reduced anterior laryngeal mobility;Reduced laryngeal elevation;Reduced airway/laryngeal closure;Reduced tongue base retraction;Trace aspiration;Pharyngeal residue - valleculae Pharyngeal Material enters airway, passes BELOW cords without attempt by patient to eject out (silent aspiration) Pharyngeal- Thin Cup Delayed swallow initiation-pyriform sinuses;Penetration/Aspiration  before swallow;Penetration/Apiration after swallow;Reduced epiglottic inversion;Reduced anterior laryngeal mobility;Reduced laryngeal elevation;Reduced airway/laryngeal closure;Reduced tongue base retraction Pharyngeal Material enters airway, passes BELOW cords and not ejected out despite cough attempt by patient Pharyngeal- Thin Straw Delayed swallow initiation-pyriform sinuses;Penetration/Aspiration during swallow;Pharyngeal residue - valleculae;Reduced epiglottic inversion;Reduced anterior laryngeal mobility;Reduced laryngeal elevation;Reduced airway/laryngeal closure;Reduced tongue base retraction Pharyngeal Material enters  airway, remains ABOVE vocal cords and not ejected out Pharyngeal- Puree Delayed swallow initiation-vallecula;Pharyngeal residue - valleculae;Pharyngeal residue - pyriform;Reduced anterior laryngeal mobility;Reduced tongue base retraction Pharyngeal Material does not enter airway Pharyngeal- Mechanical Soft -- Pharyngeal -- Pharyngeal- Regular Delayed swallow initiation-vallecula;Reduced anterior laryngeal mobility;Pharyngeal residue - valleculae;Pharyngeal residue - pyriform;Pharyngeal residue - posterior pharnyx Pharyngeal Material does not enter airway Pharyngeal- Multi-consistency -- Pharyngeal -- Pharyngeal- Pill -- Pharyngeal -- Pharyngeal Comment --  CHL IP CERVICAL ESOPHAGEAL PHASE 03/30/2020 Cervical Esophageal Phase WFL Pudding Teaspoon -- Pudding Cup -- Honey Teaspoon -- Honey Cup -- Nectar Teaspoon -- Nectar Cup -- Nectar Straw -- Thin Teaspoon -- Thin Cup -- Thin Straw -- Puree -- Mechanical Soft -- Regular -- Multi-consistency -- Pill -- Cervical Esophageal Comment -- Colin Mulders M.S., CCC-SLP Acute Rehabilitation Services Office: [phone] [place] 03/30/2020, 1:17 PM                        Medical Problem List and Plan: 1.  Impaired function and L hemiparesis/dysphagia secondary to R MCA CVA s/p tPA             -patient may  shower             -ELOS/Goals: 2-2.5  weeks; mod I to supervision 2.  Antithrombotics: -DVT/anticoagulation:  Pharmaceutical: Lovenox             -antiplatelet therapy: ASA alone--to repeat CCT on 07/30 and if SAH resolves to consider DAPT X 3 months.  3. Headaches/Pain Management: Tylenol prn.  4. Mood: LCSW to follow for evaluation and support.              -antipsychotic agents: N/A 5. Neuropsych: This patient  Is not fully capable of making decisions on his own behalf. 6. Skin/Wound Care: Routine pressure relief measures.  7. Fluids/Electrolytes/Nutrition: Monitor I/O. Check lytes in am as on modified diet. 8. HTN: with recent hypotension intermittently-Long term BP goal 130-150 due to carotid stenosis. Monitor BP tid --continue  9. Centrilobar Emphysema: On maintenance inhaler (Stiolto respimat) at home-->resume  10. Hyperlipidemia: On Crestor 11. Parotid mass: Follow up with ENT after discharge.  12. Right shoulder pain: Question RTC strain--will add voltaren gel with ice for local measures.  13. Dysphagia: On Dysphagia 3, honey liquids --encourage fluid intake.  Single ice chips after oral care.  14. Constipation: Has not had any for past 5 day? -had a large BM 2 nights ago per wife- still needs to keep going.        Bary Leriche, PA-C 04/01/2020      I have personally performed a face to face diagnostic evaluation of this patient and formulated the key components of the plan.  Additionally, I have personally reviewed laboratory data, imaging studies, as well as relevant notes and concur with the physician assistant's documentation above.   The patient's status has not changed from the original H&P.  Any changes in documentation from the acute care chart have been noted above.

## 2020-04-02 NOTE — Progress Notes (Signed)
Speech Language Pathology Daily Session Note  Patient Details  Name: Ethan Fitzgerald MRN: [id number] Date of Birth: July 15, 1956  Today's Date: 04/03/2020 SLP Individual Time: 0830-0900 SLP Individual Time Calculation (min): 30 min  Short Term Goals: Week 1: SLP Short Term Goal 1 (Week 1): STG=LTG due to short ELOS  Skilled Therapeutic Interventions: Pt was seen for skilled ST targeting dysphagia and speech goals. After completing oral care, pt accepted upgraded trials of ice and cup sips of thin H2O. He exhibited reduced labial seal leading to some left anterior spillage of thin at times. Subtle throat clearing noted in ~15% of trials of ice. Cup sips of a total of ~5 oz thin H2O resulted in 2 immediate coughs after consuming large boluses. Pt also verbally recalled 2 out of 3 swallow strategies Mod I. He did require Min A verbal and visual cues to assume upright neutral head positioning for safest intake, which seemed to reduce overt s/sx aspiration. SLP also introduced strategies for speech intelligibility with emphasis on slow rate and overarticulation. Handout also provided. He was ~85-90% intelligible in conversation despite mild dysarthria. Pt left laying in bed with alarm set and needs within reach. Continue per current plan of care.          Pain Pain Assessment Pain Scale: Faces Faces Pain Scale: No hurt  Therapy/Group: Individual Therapy  Arbutus Leas 04/03/2020, 7:19 AM

## 2020-04-02 NOTE — Evaluation (Signed)
Speech Language Pathology Assessment and Plan  Patient Details  Name: Ethan Fitzgerald MRN: [id number] Date of Birth: 1955/12/08  SLP Diagnosis: Dysphagia;Dysarthria  Rehab Potential: Good ELOS: 8/4    Today's Date: 04/02/2020 SLP Individual Time: 6759-1638 SLP Individual Time Calculation (min): 55 min   Hospital Problem: Active Problems:   Acute right MCA stroke [facility])  Past Medical History:  Past Medical History:  Diagnosis Date  . COPD (chronic obstructive pulmonary disease) (HCC)    mild  . COVID-19 09/2019  . Erectile dysfunction   . Headache    daily, stress headaches  . Knee pain, left   . Seasonal allergies   . Tobacco dependence    Past Surgical History:  Past Surgical History:  Procedure Laterality Date  . BACK SURGERY     metal plate in back  . COLONOSCOPY    . COLONOSCOPY WITH PROPOFOL N/A 04/26/2019   Procedure: COLONOSCOPY WITH PROPOFOL;  Surgeon: Jonathon Bellows, MD;  Location: [facility] ENDOSCOPY;  Service: Gastroenterology;  Laterality: N/A;  . HERNIA REPAIR Right   . IR ANGIO INTRA EXTRACRAN SEL COM CAROTID INNOMINATE UNI L MOD SED  03/27/2020  . IR ANGIO VERTEBRAL SEL SUBCLAVIAN INNOMINATE UNI L MOD SED  03/27/2020  . IR ANGIO VERTEBRAL SEL VERTEBRAL UNI R MOD SED  03/27/2020  . IR CT HEAD LTD  03/27/2020  . IR PERCUTANEOUS ART THROMBECTOMY/INFUSION INTRACRANIAL INC DIAG ANGIO  03/27/2020  . KNEE ARTHROSCOPY Left 03/28/2015   Procedure: Arthroscopic partial medial meniscectomy plus chondral debridement;  Surgeon: Leanor Kail, MD;  Location: [place];  Service: Orthopedics;  Laterality: Left;  . RADIOLOGY WITH ANESTHESIA N/A 03/27/2020   Procedure: IR WITH ANESTHESIA;  Surgeon: Luanne Bras, MD;  Location: [place];  Service: Radiology;  Laterality: N/A;    Assessment / Plan / Recommendation Clinical Impression Ethan Fitzgerald. Mcafee is a 44 year oldLH-male with history of COPD, daily HA, dyslipidemia, recent bronchitis with coughing episodes who was  admitted on 03/27/20 with left sided weakness,difficulty walking and difficulty speaking. CT head showed dense M1 segment with occlusive thrombus, occlusion of left ICA age-indeterminate distal reconstitution of cavernous segment and good perfusion of left-MCA and bilateral-ACAs. He received TPA and underwent cerebral angio with partial revascularization of R-MCA dominant division as well as attempts at revascularization balloon angioplasty of acutely occluded proximal right ICA with reocclusion. Follow-up MRI brain showed acute cortical/subcortical right-MCA infarct, additional small infarcts in right caudate nucleus, right pre and postcentral gyri and mid to anterior frontal lobe, SAH and incidental findings of right parotid neoplasm. Follow-up CT head showed an change in right MCA infarct and stable SAH. 2D echo was technically difficult but showed EF of 55 to 60%.  He tolerated extubation on 07/23 and due to moderate oropharyngeal dysphagia has been advanced to dysphagia 3 with honey liquids. Dr. Leonie Man question that stroke was due to dissection of large vessel from coughing episodes versus hypercoagulable state from parotid neoplasm. Patient on ASA alone due to small SAH with recommendations to repeat CT head on 7/30 and consider DAPT for 3 months if SAH resolved. Blood pressure goals of 130-150 due to intracranial stenosis. Patient has been reporting right shoulder pain as well as fall prior to admission and x-rays done today negative for fracture or injury. Therapy ongoing and patient limited by left inattention with impulsivity, left-sided weakness and balance deficits affecting mobility and ADLs. CIR was recommended due to functional decline.  Pt presents with mild-moderate dysphagia, currently consuming a diet of dys  3 textures and honey thick liquids. Per chart review of recent MBS on 7/25, pt demonstrated silent aspiration on thin liquids and sensed aspiration on nectar thick liquids due  to delayed swallow initiation and reduce laryngeal closure.  Today's oral motor exam indicated lingual weakness, reduced ROM, reduced facial ROM and labial seal on left side. During BSS, Pt consumed thin liquid via ice chips and TSP with no overt s/s aspiration, however X1 delayed cough was noted following trials of small thin liquids cup sips. Pt consumed nectar thick liquids via TSP and cup sips and honey thick liquids via cup sips with no overt aspiration. Pt also consumed trials of dys 3 and regular textures, in which pt demonstrated Mod I lingual sweeps and then use of finger sweeps to remove oral residue following the consumption of regular textures. Pt's swallow appeared slightly delayed when consuming thin and nectar thick liquids only and anterior spillage was noted x1 with honey thick liquids. SLP recommends dys 3 textures and honey thick liquid diet with intermittent supervision A to consume small bites/sips, limit distractions and intermittent dry swallow due to pharyngeal residue. SLP initiated water protocol allowing one ice chip at a time, thin liquids via TSP or small cup sips. SLP will focus on pharyngeal strength exercises and thin liquid trials, prior to repeat MBS scheduled for the beginning of next week. Pt demonstrates mild dysarthria due to lingual and left facial weakness, demonstrating 85% intelligibility in conversation with min A verbal cues to slow rate and over articulate. SLP did not administer formal cognitive linguistic assessment due to pt appearing cognitively intact during complex conversation. Pt would benefit from skilled ST services in order to maximize functional independence and reduce burden of care, recommending intermittent supervision for swallow strategies and continued ST services at discharge.   Skilled Therapeutic Interventions          Skilled ST services focused on swallow skills. SLP administered oral motor and BSS. SLP provided education pertaining to deficits  and plan for treatment. All questions were answered to satisfaction. Pt was left in room with call bell within reach and bed alarm set. SLP recommends continued skilled ST services.    SLP Assessment  Patient does not need any further Speech Lanaguage Pathology Services    Recommendations  SLP Diet Recommendations: Dysphagia 3 (Mech soft);Honey Liquid Administration via: Cup Medication Administration: Whole meds with puree Supervision: Intermittent supervision to cue for compensatory strategies;Patient able to self feed Compensations: Slow rate;Small sips/bites;Follow solids with liquid Postural Changes and/or Swallow Maneuvers: Seated upright 90 degrees;Upright 30-60 min after meal Oral Care Recommendations: Oral care BID Recommendations for Other Services: Neuropsych consult Patient destination: Home Follow up Recommendations: Home Health SLP;24 hour supervision/assistance;Outpatient SLP (supervision A for swallow) Equipment Recommended: None recommended by SLP    SLP Frequency 3 to 5 out of 7 days   SLP Duration  SLP Intensity  SLP Treatment/Interventions 8/4  Minumum of 1-2 x/day, 30 to 90 minutes  Cueing hierarchy;Speech/Language facilitation;Dysphagia/aspiration precaution training;Patient/family education    Pain Pain Assessment Pain Scale: 0-10 Pain Score: 0-No pain  Prior Functioning Cognitive/Linguistic Baseline: Within functional limits Type of Home: House  Lives With: Spouse Available Help at Discharge: Family;Available 24 hours/day Vocation: Full time employment  SLP Evaluation Cognition Overall Cognitive Status: Within Functional Limits for tasks assessed Arousal/Alertness: Awake/alert Orientation Level: Oriented X4 Attention: Sustained;Selective Focused Attention: Appears intact Sustained Attention: Appears intact Selective Attention: Appears intact Memory: Appears intact Immediate Memory Recall: Sock;Blue;Bed Memory Recall Sock: Without Cue  Memory  Recall Blue: Without Cue Memory Recall Bed: Without Cue Awareness: Appears intact Problem Solving: Appears intact Behaviors: Impulsive Safety/Judgment: Appears intact  Comprehension Auditory Comprehension Overall Auditory Comprehension: Appears within functional limits for tasks assessed Expression Expression Primary Mode of Expression: Verbal Verbal Expression Overall Verbal Expression: Appears within functional limits for tasks assessed Written Expression Dominant Hand: Left Oral Motor Oral Motor/Sensory Function Overall Oral Motor/Sensory Function: Mild impairment Facial ROM: Reduced left;Suspected CN VII (facial) dysfunction Facial Symmetry: Abnormal symmetry left;Suspected CN VII (facial) dysfunction Facial Sensation: Suspected CN V (Trigeminal) dysfunction;Reduced left Lingual Symmetry: Suspected CN XII (hypoglossal) dysfunction Velum: Impaired left Motor Speech Overall Motor Speech: Impaired Respiration: Within functional limits Phonation: Normal Resonance: Within functional limits Articulation: Impaired Level of Impairment: Conversation Intelligibility: Intelligibility reduced Word: 75-100% accurate Phrase: 75-100% accurate Sentence: 75-100% accurate Conversation: 75-100% accurate Motor Planning: Witnin functional limits Motor Speech Errors: Not applicable Effective Techniques: Slow rate;Over-articulate  Care Tool Care Tool Cognition Expression of Ideas and Wants Expression of Ideas and Wants: Without difficulty (complex and basic) - expresses complex messages without difficulty and with speech that is clear and easy to understand   Understanding Verbal and Non-Verbal Content Understanding Verbal and Non-Verbal Content: Understands (complex and basic) - clear comprehension without cues or repetitions   Memory/Recall Ability *first 3 days only Memory/Recall Ability *first 3 days only: Current season;Location of own room;That he or she is in a hospital/hospital  unit     PMSV Assessment  PMSV Trial Intelligibility: Intelligibility reduced Word: 75-100% accurate Phrase: 75-100% accurate Sentence: 75-100% accurate Conversation: 75-100% accurate  Bedside Swallowing Assessment General Previous Swallow Assessment: MBS 7/25 dys 3 textures and honey thick liquids Diet Prior to this Study: Dysphagia 3 (soft);Honey-thick liquids History of Recent Intubation: Yes Length of Intubations (days): 1 days Date extubated: 03/28/20 Behavior/Cognition: Alert;Cooperative;Other (Comment) Oral Cavity - Dentition: Dentures, top;Dentures, bottom Self-Feeding Abilities: Able to feed self Patient Positioning: Upright in bed Baseline Vocal Quality: Normal Volitional Cough: Strong Volitional Swallow: Able to elicit  Oral Care Assessment Does patient have any of the following "high(er) risk" factors?: None of the above Does patient have any of the following "at risk" factors?: None of the above Patient is HIGH RISK: Non-ventilated: Order set for Adult Oral Care Protocol initiated - "High Risk Patients - Non-Ventilated" option selected  (see row information) Patient is AT RISK: Order set for Adult Oral Care Protocol initiated -  "At Risk Patients" option selected (see row information) Ice Chips Ice chips: Within functional limits Presentation: Spoon Thin Liquid Thin Liquid: Impaired Presentation: Cup;Spoon Pharyngeal  Phase Impairments: Cough - Delayed Nectar Thick Nectar Thick Liquid: Within functional limits Presentation: Cup;Spoon Honey Thick Honey Thick Liquid: Within functional limits Presentation: Cup;Self fed Other Comments: slight anterior spillage but was attempting to verbalize Puree Puree: Not tested Solid Solid: Impaired Presentation: Self Fed Oral Phase Functional Implications: Oral residue BSE Assessment Risk for Aspiration Impact on safety and function: Moderate aspiration risk;Mild aspiration risk  Short Term Goals: Week 1: SLP  Short Term Goal 1 (Week 1): STG=LTG due to short ELOS  Refer to Care Plan for Long Term Goals  Recommendations for other services: Neuropsych  Discharge Criteria: Patient will be discharged from SLP if patient refuses treatment 3 consecutive times without medical reason, if treatment goals not met, if there is a change in medical status, if patient makes no progress towards goals or if patient is discharged from hospital.  The above assessment, treatment plan, treatment alternatives and goals were discussed and  mutually agreed upon: by patient  Denisa Enterline  [facility] 04/02/2020, 5:08 PM

## 2020-04-02 NOTE — Patient Care Conference (Signed)
Inpatient RehabilitationTeam Conference and Plan of Care Update Date: 04/02/2020   Time: 11:00    Patient Name: Ethan Fitzgerald      Medical Record Number: [id number]  Date of Birth: 13-Sep-1955 Sex: Male         Room/Bed: 4W01C/4W01C-01 Payor Info: Payor: [employer] MEDICARE / Plan: [employer] MEDICARE HMO / Product Type: *No Product type* /    Admit Date/Time:  04/01/2020  8:29 PM  Primary Diagnosis:  <principal problem not specified>  Hospital Problems: Active Problems:   [facility]    Expected Discharge Date: Expected Discharge Date: 04/09/20  Team Members Present: Physician leading conference: Dr. Alysia Penna Care Coodinator Present: [name], RN, BSN, CRRN;Loralee Pacas, [place] Nurse Present: Other (comment) Suezanne Cheshire, RN) PT Present: Page Spiro, PT SLP Present: Charolett Bumpers, SLP PPS Coordinator present : Ileana Ladd, Burna Mortimer, SLP     Current Status/Progress Goal Weekly Team Focus  Bowel/Bladder   Patient has condom catheter during admission, patient reported continent of BM. LBM 03/30/20  Obtain continence of bowel and bladder  Assist patient with toileting PRN   Swallow/Nutrition/ Hydration   eval pending         ADL's             Mobility   supervision bed mobility, CGA/min assist transfers, gait up to 29ft no AD with CGA/min assist, CGA/min assist 12 steps using HRs  mod-I/supervision at ambulatory level  evaluation, pt education, activity tolerance/endurnace, gait training, standing balance, transfer training, stair navigation   Communication   eval pending         Safety/Cognition/ Behavioral Observations  eval pending         Pain   patient denies pain  pain level at 0/10 with or without activity  Q shift and PRN pain assessment   Skin   Patient has good skin condtion, mild bruising is observe in arms and groin  Maintain good skin condition  Q shift and PRN skin assessment     Team Discussion:  Discharge  Planning/Teaching Needs:  Home with wife  TBD   Current Update: Evaluations incomplete  Current Barriers to Discharge:  Nutritional means  Possible Resolutions to Barriers: Assess need for continuous IVF; encourage po intake  Patient on target to meet rehab goals: yes  *See Care Plan and progress notes for long and short-term goals.   Revisions to Treatment Plan:  None    Medical Summary                 I attest that I was present, lead the team conference, and concur with the assessment and plan of the team.   [name] B 04/02/2020, 3:54 PM

## 2020-04-02 NOTE — Progress Notes (Signed)
Inpatient Rehabilitation  Patient information reviewed and entered into eRehab system by Selso Mannor M. Vijay Durflinger, M.A., CCC/SLP, PPS Coordinator.  Information including medical coding, functional ability and quality indicators will be reviewed and updated through discharge.    

## 2020-04-02 NOTE — Evaluation (Signed)
Physical Therapy Assessment and Plan  Patient Details  Name: Ethan Fitzgerald MRN: [id number] Date of Birth: 06/12/1956  PT Diagnosis: Abnormality of gait, Cognitive deficits, Difficulty walking, Hemiparesis dominant, Impaired cognition, Impaired sensation and Muscle weakness Rehab Potential: Excellent ELOS: ~7 days    Today's Date: 04/02/2020 PT Individual Time: 4585-9292 PT Individual Time Calculation (min): 61 min    Hospital Problem: Active Problems:   Acute right MCA stroke [facility])   Past Medical History:  Past Medical History:  Diagnosis Date  . COPD (chronic obstructive pulmonary disease) (HCC)    mild  . COVID-19 09/2019  . Erectile dysfunction   . Headache    daily, stress headaches  . Knee pain, left   . Seasonal allergies   . Tobacco dependence    Past Surgical History:  Past Surgical History:  Procedure Laterality Date  . BACK SURGERY     metal plate in back  . COLONOSCOPY    . COLONOSCOPY WITH PROPOFOL N/A 04/26/2019   Procedure: COLONOSCOPY WITH PROPOFOL;  Surgeon: Jonathon Bellows, MD;  Location: [facility] ENDOSCOPY;  Service: Gastroenterology;  Laterality: N/A;  . HERNIA REPAIR Right   . IR ANGIO INTRA EXTRACRAN SEL COM CAROTID INNOMINATE UNI L MOD SED  03/27/2020  . IR ANGIO VERTEBRAL SEL SUBCLAVIAN INNOMINATE UNI L MOD SED  03/27/2020  . IR ANGIO VERTEBRAL SEL VERTEBRAL UNI R MOD SED  03/27/2020  . IR CT HEAD LTD  03/27/2020  . IR PERCUTANEOUS ART THROMBECTOMY/INFUSION INTRACRANIAL INC DIAG ANGIO  03/27/2020  . KNEE ARTHROSCOPY Left 03/28/2015   Procedure: Arthroscopic partial medial meniscectomy plus chondral debridement;  Surgeon: Leanor Kail, MD;  Location: [place];  Service: Orthopedics;  Laterality: Left;  . RADIOLOGY WITH ANESTHESIA N/A 03/27/2020   Procedure: IR WITH ANESTHESIA;  Surgeon: Luanne Bras, MD;  Location: [place];  Service: Radiology;  Laterality: N/A;    Assessment & Plan Clinical Impression: Patient is a 64 y.o. year old LH-male  with history of COPD, daily HA, dyslipidemia, recent bronchitis with coughing episodes who was admitted on 03/27/20 with left sided weakness, difficulty walking and difficulty speaking.  CT head showed dense M1 segment with occlusive thrombus, occlusion of left ICA age-indeterminate distal reconstitution of cavernous segment and good perfusion of left-MCA and bilateral-ACAs.  He received TPA and underwent cerebral angio with partial revascularization of  R-MCA dominant division as well as attempts at revascularization balloon angioplasty of acutely occluded proximal right ICA with reocclusion.  Follow-up MRI brain showed acute cortical/subcortical right-MCA infarct, additional small infarcts in right caudate nucleus, right pre and postcentral gyri and mid to anterior frontal lobe, SAH and incidental findings of right parotid neoplasm.  Follow-up CT head showed an change in right MCA infarct and stable SAH.  2D echo was technically difficult but showed EF of 55 to 60%.  He tolerated extubation on 07/23 and due to moderate oropharyngeal dysphagia has been advanced to dysphagia 3 with honey liquids.  Dr. Leonie Man question that stroke was due to dissection of large vessel from coughing episodes versus hypercoagulable state from parotid neoplasm.  Patient on ASA alone due to small SAH with recommendations to repeat CT head on 7/30 and consider DAPT for 3 months if SAH resolved.  Blood pressure goals of 130-150 due to intracranial stenosis.  Patient has been reporting right shoulder pain as well as fall prior to admission and x-rays done today negative for fracture or injury.  Therapy ongoing and patient limited by left inattention with impulsivity, left-sided weakness  and balance deficits affecting mobility and ADLs.  CIR was recommended due to functional decline. Patient transferred to CIR on 04/01/2020 .   Patient currently requires min assist with mobility secondary to muscle weakness, decreased cardiorespiratoy  endurance, impaired timing and sequencing, unbalanced muscle activation and decreased coordination, decreased attention to left, decreased awareness, decreased safety awareness, decreased memory and delayed processing and decreased standing balance, decreased postural control and decreased balance strategies.  Prior to hospitalization, patient was independent  with mobility and lived with Spouse in a House home.  Home access is 3Stairs to enter.  Patient will benefit from skilled PT intervention to maximize safe functional mobility, minimize fall risk and decrease caregiver burden for planned discharge home with 24 hour supervision.  Anticipate patient will benefit from follow up OP at discharge.  PT - End of Session Activity Tolerance: Tolerates 30+ min activity with multiple rests Endurance Deficit: Yes Endurance Deficit Description: requires seated rest breaks due to fatigue PT Assessment Rehab Potential (ACUTE/IP ONLY): Excellent PT Barriers to Discharge: Nutrition means;Neurogenic Bowel & Bladder;Incontinence PT Patient demonstrates impairments in the following area(s): Balance;Perception;Behavior;Safety;Edema;Sensory;Endurance;Skin Integrity;Motor;Nutrition;Pain PT Transfers Functional Problem(s): Bed Mobility;Bed to Chair;Car;Furniture;Floor PT Locomotion Functional Problem(s): Ambulation;Stairs PT Plan PT Intensity: Minimum of 1-2 x/day ,45 to 90 minutes PT Frequency: 5 out of 7 days PT Duration Estimated Length of Stay: ~7 days PT Treatment/Interventions: Ambulation/gait training;Community reintegration;DME/adaptive equipment instruction;Neuromuscular re-education;Psychosocial support;Stair training;UE/LE Strength taining/ROM;Balance/vestibular training;Discharge planning;Functional electrical stimulation;Pain management;Skin care/wound management;Therapeutic Activities;UE/LE Coordination activities;Cognitive remediation/compensation;Disease management/prevention;Functional mobility  training;Patient/family education;Splinting/orthotics;Therapeutic Exercise;Visual/perceptual remediation/compensation PT Transfers Anticipated Outcome(s): supervision PT Locomotion Anticipated Outcome(s): supervision PT Recommendation Follow Up Recommendations: Outpatient PT;24 hour supervision/assistance Patient destination: Home Equipment Recommended: To be determined    PT Evaluation Evaluation completed (see details above and below) with education on PT POC and goals and individual treatment initiated with focus on functional mobility and pt education regarding daily therapy schedule, weekly team meetings, purpose of PT evaluation, and other CIR information.    Precautions/Restrictions  Precautions Precautions: Fall;Other (comment) Precaution Comments: impulsive; Lt inattention  Pain Pain Assessment Pain Scale: 0-10 Pain Score: 0-No pain Home Living/Prior Functioning Home Living Family/patient expects to be discharged to:: Private residence Living Arrangements: Spouse/significant other Available Help at Discharge: Family;Available 24 hours/day (wife is retired) Type of Home: House Home Access: Stairs to enter Technical brewer of Steps: 3 Entrance Stairs-Rails: Right;Left;Can reach both [place]: One level Bathroom Shower/Tub: Holiday representative Accessibility: Yes  Lives With: Spouse (wife, [name]) Prior Function Level of Independence: Independent with gait;Independent with transfers;Independent with homemaking with ambulation  Able to Take Stairs?: Reciprically Driving: Yes Vocation: Full time employment 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 [employer] Comments: access to RW from pt's father Perception  Perception Perception: Impaired Inattention/Neglect: Does not attend to left side of body;Does not attend to left visual field Praxis Praxis: Intact  Cognition Overall Cognitive Status: Impaired/Different from  baseline Arousal/Alertness: Awake/alert Orientation Level: Oriented X4 Attention: Focused;Sustained Focused Attention: Appears intact Sustained Attention: Appears intact Behaviors: Impulsive Safety/Judgment: Impaired Sensation Sensation Light Touch: Appears Intact Hot/Cold: Not tested Proprioception: Impaired by gross assessment;Appears Intact Stereognosis: Not tested Coordination Gross Motor Movements are Fluid and Coordinated: No Coordination and Movement Description: gross motor movements impaired due to L hemiparesis Heel Shin Test: symmetrical Motor  Motor Motor: Other (comment) Motor - Skilled Clinical Observations: mild L hemiparesis, L inattention   Trunk/Postural Assessment  Cervical Assessment Cervical Assessment: Exceptions to [facility] (forward head) Thoracic Assessment Thoracic Assessment: Exceptions to [facility] (  rounded shoulders) Lumbar Assessment Lumbar Assessment: Exceptions to [facility] (posterior pelvic tilt in sitting) Postural Control Postural Limitations: mildly decreased  Balance Balance Balance Assessed: Yes Standardized Balance Assessment Standardized Balance Assessment: Functional Gait Assessment Static Sitting Balance Static Sitting - Level of Assistance: 5: Stand by assistance Dynamic Sitting Balance Dynamic Sitting - Level of Assistance: 5: Stand by assistance Static Standing Balance Static Standing - Balance Support: During functional activity Static Standing - Level of Assistance: Other (comment) (CGA) Dynamic Standing Balance Dynamic Standing - Balance Support: During functional activity Extremity Assessment      RLE Assessment RLE Assessment: Within Functional Limits Active Range of Motion (AROM) Comments: WFL General Strength Comments: strength assessed in supine and observed functionally LLE Assessment LLE Assessment: Exceptions to [facility] Active Range of Motion (AROM) Comments: WFL General Strength Comments: strength assessed in supine and  observed functionally LLE Strength Left Hip Flexion: 4-/5 Left Knee Flexion: 4/5 Left Knee Extension: 4/5 Left Ankle Dorsiflexion: 4-/5 Left Ankle Plantar Flexion: 4-/5  Care Tool Care Tool Bed Mobility Roll left and right activity   Roll left and right assist level: Supervision/Verbal cueing    Sit to lying activity   Sit to lying assist level: Contact Guard/Touching assist    Lying to sitting edge of bed activity  Lying to sitting edge of bed assist level: Contact Guard/Touching assist     Care Tool Transfers Sit to stand transfer   Sit to stand assist level: Minimal Assistance - Patient > 75%    Chair/bed transfer   Chair/bed transfer assist level: Minimal Assistance - Patient > 75%     Chiropractor transfer assist level: Minimal Assistance - Patient > 75%      Care Tool Locomotion Ambulation   Assist level: Minimal Assistance - Patient > 75% Assistive device: No Device Max distance: 246f  Walk 10 feet activity   Assist level: Minimal Assistance - Patient > 75% Assistive device: No Device   Walk 50 feet with 2 turns activity   Assist level: Minimal Assistance - Patient > 75% Assistive device: No Device  Walk 150 feet activity   Assist level: Minimal Assistance - Patient > 75% Assistive device: No Device  Walk 10 feet on uneven surfaces activity   Assist level: Minimal Assistance - Patient > 75% Assistive device: Other (comment) (handrail)  Stairs   Assist level: Minimal Assistance - Patient > 75% Stairs assistive device: 2 hand rails Max number of stairs: 12  Walk up/down 1 step activity   Walk up/down 1 step (curb) assist level: Minimal Assistance - Patient > 75% Walk up/down 1 step or curb assistive device: 2 hand rails    Walk up/down 4 steps activity Walk up/down 4 steps assist level: Minimal Assistance - Patient > 75% Walk up/down 4 steps assistive device: 2 hand rails  Walk up/down 12 steps activity   Walk up/down 12  steps assist level: Minimal Assistance - Patient > 75% Walk up/down 12 steps assistive device: 2 hand rails  Pick up small objects from floor   Pick up small object from the floor assist level: Minimal Assistance - Patient > 75%    Wheelchair Will patient use wheelchair at discharge?: No          Wheel 50 feet with 2 turns activity      Wheel 150 feet activity        Refer to [place]  GOAL WEEK 1 PT Short Term Goal 1 (Week 1): = to LTGs based on ELOS   Recommendations for other services: None   Skilled Therapeutic Intervention Mobility Bed Mobility Bed Mobility: Supine to Sit;Sit to Supine Supine to Sit: Supervision/Verbal cueing Sit to Supine: Supervision/Verbal cueing Transfers Transfers: Sit to Stand;Stand to Sit;Stand Pivot Transfers Sit to Stand: Minimal Assistance - Patient > 75%;Contact Guard/Touching assist Stand to Sit: Minimal Assistance - Patient > 75%;Contact Guard/Touching assist Stand Pivot Transfers: Minimal Assistance - Patient > 75% Transfer (Assistive device): None Locomotion  Gait Ambulation: Yes Gait Assistance: Minimal Assistance - Patient > 75%;Contact Guard/Touching assist Gait Distance (Feet): 258 Feet Assistive device: None Gait Assistance Details: Verbal cues for technique;Verbal cues for sequencing;Verbal cues for gait pattern Gait Gait: Yes Gait Pattern: Impaired Gait Pattern: Step-through pattern;Poor foot clearance - left High Level Ambulation High Level Ambulation: Other high level ambulation High Level Ambulation - Other Comments: ~32f up/down ramp and ~168fx2 over mulch with intermittent UE support CGA/min assist Stairs / Additional Locomotion Stairs: Yes Stairs Assistance: Minimal Assistance - Patient > 75% Stair Management Technique: Two rails Number of Stairs: 12 Height of Stairs: 6 Ramp: Minimal Assistance - Patient >75% (using handrail) Curb: Minimal Assistance - Patient >75% (using  handrail) Wheelchair Mobility Wheelchair Mobility: No    Discharge Criteria: Patient will be discharged from PT if patient refuses treatment 3 consecutive times without medical reason, if treatment goals not met, if there is a change in medical status, if patient makes no progress towards goals or if patient is discharged from hospital.  The above assessment, treatment plan, treatment alternatives and goals were discussed and mutually agreed upon: by patient  CaTawana Scale PT, DPT, CSRS  04/02/2020, 7:53 AM

## 2020-04-02 NOTE — Progress Notes (Signed)
[place] PHYSICAL MEDICINE & REHABILITATION PROGRESS NOTE   Subjective/Complaints:  Received IVF, reviewed labs  ROS- neg CP, SOB, N/V/D  Objective:   DG Shoulder Left Port  Result Date: 03/31/2020 CLINICAL DATA:  Shoulder pain after fall EXAM: LEFT SHOULDER COMPARISON:  None. FINDINGS: There is no evidence of fracture or dislocation. There is no evidence of arthropathy or other focal bone abnormality. Soft tissues are unremarkable. IMPRESSION: Negative. Electronically Signed   By: Donavan Foil M.D.   On: 03/31/2020 19:42   Recent Labs    04/01/20 0342 04/02/20 0548  WBC 10.4 10.2  HGB 14.2 14.8  HCT 40.4 42.1  PLT 276 272   Recent Labs    04/01/20 0342 04/02/20 0548  NA 136 140  K 3.5 3.6  CL 106 110  CO2 22 22  GLUCOSE 94 100*  BUN 13 19  CREATININE 0.95 0.85  CALCIUM 9.0 9.3    Intake/Output Summary (Last 24 hours) at 04/02/2020 1036 Last data filed at 04/02/2020 0758 Gross per 24 hour  Intake 10 ml  Output 200 ml  Net -190 ml     Physical Exam: Vital Signs Blood pressure (!) 138/85, pulse 62, temperature 97.8 F (36.6 C), temperature source Oral, resp. rate 16, height 6' (1.829 m), weight 76.7 kg, SpO2 99 %.   General: No acute distress Mood and affect are appropriate Heart: Regular rate and rhythm no rubs murmurs or extra sounds Lungs: Clear to auscultation, breathing unlabored, no rales or wheezes Abdomen: Positive bowel sounds, soft nontender to palpation, nondistended Extremities: No clubbing, cyanosis, or edema Skin: No evidence of breakdown, no evidence of rash Neurologic: Cranial nerves II through XII intact, motor strength is 5/5 in right 4/5 left deltoid, bicep, tricep, grip, hip flexor, knee extensors, ankle dorsiflexor and plantar flexor Sensory exam normal sensation to light touch  in bilateral upper and lower extremities  Musculoskeletal: Full range of motion in all 4 extremities. No joint swelling   Assessment/Plan: 1. Functional  deficits secondary to Right MCA infarct which require 3+ hours per day of interdisciplinary therapy in a comprehensive inpatient rehab setting.  Physiatrist is providing close team supervision and 24 hour management of active medical problems listed below.  Physiatrist and rehab team continue to assess barriers to discharge/monitor patient progress toward functional and medical goals  Care Tool:  Bathing  Bathing activity did not occur: Safety/medical concerns           Bathing assist       Upper Body Dressing/Undressing Upper body dressing Upper body dressing/undressing activity did not occur (including orthotics): Safety/medical concerns What is the patient wearing?: Hospital gown only    Upper body assist Assist Level: Minimal Assistance - Patient > 75%    Lower Body Dressing/Undressing Lower body dressing    Lower body dressing activity did not occur: N/A What is the patient wearing?: Underwear/pull up     Lower body assist Assist for lower body dressing: Moderate Assistance - Patient 50 - 74%     Toileting Toileting    Toileting assist Assist for toileting: Moderate Assistance - Patient 50 - 74%     Transfers Chair/bed transfer  Transfers assist  Chair/bed transfer activity did not occur: Safety/medical concerns  Chair/bed transfer assist level: Minimal Assistance - Patient > 75%     Locomotion Ambulation   Ambulation assist              Walk 10 feet activity   Assist  Walk 50 feet activity   Assist           Walk 150 feet activity   Assist           Walk 10 feet on uneven surface  activity   Assist           Wheelchair     Assist               Wheelchair 50 feet with 2 turns activity    Assist            Wheelchair 150 feet activity     Assist          Blood pressure (!) 138/85, pulse 62, temperature 97.8 F (36.6 C), temperature source Oral, resp. rate 16, height 6'  (1.829 m), weight 76.7 kg, SpO2 99 %.   Medical Problem List and Plan: 1.  Impaired function and L hemiparesis/dysphagia secondary to R MCA CVA s/p tPA             -patient may  shower             -ELOS/Goals: 2-2.5 weeks; mod I to supervision 2.  Antithrombotics: -DVT/anticoagulation:  Pharmaceutical: Lovenox             -antiplatelet therapy: ASA alone--to repeat CCT on 07/30 and if SAH resolves to consider DAPT X 3 months.  3. Headaches/Pain Management: Tylenol prn.  4. Mood: LCSW to follow for evaluation and support.              -antipsychotic agents: N/A 5. Neuropsych: This patient  Is not fully capable of making decisions on his own behalf. 6. Skin/Wound Care: Routine pressure relief measures.  7. Fluids/Electrolytes/Nutrition: Monitor I/O. Check lytes in am as on modified diet. 8. HTN: with recent hypotension intermittently-Long term BP goal 130-150 due to carotid stenosis. Monitor BP tid --continue  9. Centrilobar Emphysema: On maintenance inhaler (Stiolto respimat) at home-->resume  10. Hyperlipidemia: On Crestor 11. Parotid mass: Follow up with ENT after discharge.  12. Right shoulder pain: Question RTC strain--will add voltaren gel with ice for local measures.  13. Dysphagia: On Dysphagia 3, honey liquids --encourage fluid intake.  Single ice chips after oral care.  14. Constipation: Has not had any for past 5 day? -had a large BM 2 nights ago per wife- still needs to keep going.     LOS: 1 days A FACE TO FACE EVALUATION WAS PERFORMED  Charlett Blake 04/02/2020, 10:36 AM

## 2020-04-02 NOTE — Progress Notes (Signed)
At 2000, Probation officer received report from [place], [place] about the patient. Patient admitted to [facility]Rehab Rm 01 at 2030. Patient is alert and oriented, no distress noted. Writer orient patient about safety, therapy schedule, and the use of call button when needed.

## 2020-04-03 ENCOUNTER — Inpatient Hospital Stay (HOSPITAL_COMMUNITY): Payer: Medicare HMO

## 2020-04-03 ENCOUNTER — Inpatient Hospital Stay (HOSPITAL_COMMUNITY): Payer: Medicare HMO | Admitting: Physical Therapy

## 2020-04-03 ENCOUNTER — Inpatient Hospital Stay (HOSPITAL_COMMUNITY): Payer: Medicare HMO | Admitting: Speech Pathology

## 2020-04-03 LAB — GLUCOSE, CAPILLARY
Glucose-Capillary: 93 mg/dL (ref 70–99)
Glucose-Capillary: 91 mg/dL (ref 70–99)
Glucose-Capillary: 81 mg/dL (ref 70–99)
Glucose-Capillary: 96 mg/dL (ref 70–99)

## 2020-04-03 NOTE — Progress Notes (Signed)
Physical Therapy Session Note  Patient Details  Name: Ethan Fitzgerald MRN: [id number] Date of Birth: November 30, 1955  Today's Date: 04/03/2020 PT Individual Time: 0945-1030 PT Individual Time Calculation (min): 45 min   Short Term Goals: Week 1:  PT Short Term Goal 1 (Week 1): = to LTGs based on ELOS  Skilled Therapeutic Interventions/Progress Updates:   Pt received supine in bed and agreeable to PT. Supine>sit transfer with supervisoin assist and cues for decreased use of rails. Stand pivot transfer to [facility] with supervision assist and no UE support. Pt transported to entrance of [place]. Gait training with UE support on IV pole 2 x 265f with supervision assist from PT with cues for safety and awareness of obstacles on the L. Dynamic balance/gait training to perform forward/reverse gait 3 x 137fwith min-CGA to prevent L lateral LOB; side stepping R and L 103f 3 with min-CGA for safety and min cues for symmetrical gait pattern to prevent posterior LOB and improved weight shift on the to the LLE. Pt returned to room and performed stand pivot transfer to bed with supervision assist and UE supported on bed rails. Sit>supine completed with supervision  Assist, and left supine in bed with call bell in reach and all needs met.         Therapy Documentation Precautions:  Precautions Precautions: Fall, Other (comment) Precaution Comments: impulsive; Lt inattention  Restrictions Weight Bearing Restrictions: No    Vital Signs: Therapy Vitals Temp: 98.1 F (36.7 C) Pulse Rate: 66 Resp: 18 BP: 126/83 Patient Position (if appropriate): Lying Oxygen Therapy SpO2: 100 % O2 Device: Room Air Pain: denies  Therapy/Group: Individual Therapy  AusLorie Phenix29/2021, 3:26 PM

## 2020-04-03 NOTE — Progress Notes (Signed)
[place] PHYSICAL MEDICINE & REHABILITATION PROGRESS NOTE   Subjective/Complaints:  No issues.  Poor fluid intake.  Borderline BUN.  Review of systems negative for chest pain shortness of breath nausea vomiting diarrhea Objective:   No results found. Recent Labs    04/01/20 0342 04/02/20 0548  WBC 10.4 10.2  HGB 14.2 14.8  HCT 40.4 42.1  PLT 276 272   Recent Labs    04/01/20 0342 04/02/20 0548  NA 136 140  K 3.5 3.6  CL 106 110  CO2 22 22  GLUCOSE 94 100*  BUN 13 19  CREATININE 0.95 0.85  CALCIUM 9.0 9.3    Intake/Output Summary (Last 24 hours) at 04/03/2020 0858 Last data filed at 04/03/2020 0532 Gross per 24 hour  Intake 797.09 ml  Output 400 ml  Net 397.09 ml     Physical Exam: Vital Signs Blood pressure (!) 136/91, pulse 76, temperature 97.8 F (36.6 C), temperature source Oral, resp. rate 17, height 6' (1.829 m), weight 78.2 kg, SpO2 93 %.   General: No acute distress Mood and affect are appropriate Heart: Regular rate and rhythm no rubs murmurs or extra sounds Lungs: Clear to auscultation, breathing unlabored, no rales or wheezes Abdomen: Positive bowel sounds, soft nontender to palpation, nondistended Extremities: No clubbing, cyanosis, or edema Skin: No evidence of breakdown, no evidence of rash Neurologic: Cranial nerves II through XII intact, motor strength is 5/5 in right and 4/5 left deltoid, bicep, tricep, grip, hip flexor, knee extensors, ankle dorsiflexor and plantar flexor Sensory exam normal sensation to light touch in bilateral upper and lower extremities  Musculoskeletal: Pain with abduction left shoulder  Assessment/Plan: 1. Functional deficits secondary to Right MCA infarct which require 3+ hours per day of interdisciplinary therapy in a comprehensive inpatient rehab setting.  Physiatrist is providing close team supervision and 24 hour management of active medical problems listed below.  Physiatrist and rehab team continue to  assess barriers to discharge/monitor patient progress toward functional and medical goals  Care Tool:  Bathing  Bathing activity did not occur: Safety/medical concerns Body parts bathed by patient: Right arm, Left arm, Chest, Abdomen, Front perineal area, Buttocks, Right upper leg, Left upper leg, Right lower leg, Face         Bathing assist Assist Level: Minimal Assistance - Patient > 75%     Upper Body Dressing/Undressing Upper body dressing Upper body dressing/undressing activity did not occur (including orthotics): Safety/medical concerns What is the patient wearing?: Pull over shirt    Upper body assist Assist Level: Set up assist    Lower Body Dressing/Undressing Lower body dressing    Lower body dressing activity did not occur: N/A What is the patient wearing?: Underwear/pull up, Pants     Lower body assist Assist for lower body dressing: Contact Guard/Touching assist     Toileting Toileting    Toileting assist Assist for toileting: Minimal Assistance - Patient > 75%     Transfers Chair/bed transfer  Transfers assist  Chair/bed transfer activity did not occur: Safety/medical concerns  Chair/bed transfer assist level: Minimal Assistance - Patient > 75%     Locomotion Ambulation   Ambulation assist      Assist level: Minimal Assistance - Patient > 75% Assistive device: No Device Max distance: 258ft   Walk 10 feet activity   Assist     Assist level: Minimal Assistance - Patient > 75% Assistive device: No Device   Walk 50 feet activity   Assist    Assist  level: Minimal Assistance - Patient > 75% Assistive device: No Device    Walk 150 feet activity   Assist    Assist level: Minimal Assistance - Patient > 75% Assistive device: No Device    Walk 10 feet on uneven surface  activity   Assist     Assist level: Minimal Assistance - Patient > 75% Assistive device: Other (comment) (handrail)   Wheelchair     Assist Will  patient use wheelchair at discharge?: No             Wheelchair 50 feet with 2 turns activity    Assist            Wheelchair 150 feet activity     Assist          Blood pressure (!) 136/91, pulse 76, temperature 97.8 F (36.6 C), temperature source Oral, resp. rate 17, height 6' (1.829 m), weight 78.2 kg, SpO2 93 %.    Medical Problem List and Plan: 1.Impaired function and L hemiparesis/dysphagiasecondary to R MCA CVA s/p tPA -patient may shower -ELOS/Goals: 2-2.5 weeks; mod I to supervision 2. Antithrombotics: -DVT/anticoagulation:Pharmaceutical:Lovenox -antiplatelet therapy: ASA alone--to repeat CCT on 07/30 and if SAH resolves to consider DAPT X 3 months. 3.Headaches/Pain Management:Tylenol prn. 4. Mood:LCSW to follow for evaluation and support. -antipsychotic agents: N/A 5. Neuropsych: This patientIs not fullycapable of making decisions onhisown behalf. 6. Skin/Wound Care:Routine pressure relief measures. 7. Fluids/Electrolytes/Nutrition:Monitor I/O. Check lytes in am as on modified diet. 8. IOM:BTDH recent hypotension intermittently-Long term BP goal 130-150 due to carotid stenosis.Monitor BP tid --continue  9. Centrilobar Emphysema: On maintenance inhaler (Stiolto respimat) at home-->resume  10. Hyperlipidemia:On Crestor 11. Parotid mass: Follow up with ENT after discharge.  12. Right shoulder pain: Question RTC strain--will add voltaren gel with ice for local measures.  13. Dysphagia: On Dysphagia 3, honey liquids --encourage fluid intake. Single ice chips after oral care.  Because of low liquid intake continue IV fluids low rate 50 mL/h 14. Constipation:  Last bowel movement 7/28    LOS: 2 days A FACE TO [place] E Ebony Yorio 04/03/2020, 8:58 AM

## 2020-04-03 NOTE — Progress Notes (Addendum)
[facility] Individual Statement of Services  Patient Name:  Ethan Fitzgerald  Date:  04/03/2020  Welcome to the [facility].  Our goal is to provide you with an individualized program based on your diagnosis and situation, designed to meet your specific needs.  With this comprehensive rehabilitation program, you will be expected to participate in at least 3 hours of rehabilitation therapies Monday-Friday, with modified therapy programming on the weekends.  Your rehabilitation program will include the following services:  Physical Therapy (PT), Occupational Therapy (OT), Speech Therapy (ST), 24 hour per day rehabilitation nursing, Therapeutic Recreation (TR), Neuropsychology, Care Coordinator, Rehabilitation Medicine, Nutrition Services and Pharmacy Services  Weekly team conferences will be held on Wednesdays to discuss your progress.  Your Inpatient Rehabilitation Care Coordinator will talk with you frequently to get your input and to update you on team discussions.  Team conferences with you and your family in attendance may also be held.  Expected length of stay: 7-10 days  Overall anticipated outcome: Supervision level  Depending on your progress and recovery, your program may change. Your Inpatient Rehabilitation Care Coordinator will coordinate services and will keep you informed of any changes. Your Inpatient Rehabilitation Care Coordinator's name and contact numbers are listed  below.  The following services may also be recommended but are not provided by the [facility] will be made to provide these services after discharge if needed.  Arrangements include referral to agencies that provide these services.  Your insurance has been verified to be:  [facility] HMO Your primary doctor is:   [name], MD  Pertinent information will be shared with your doctor and your insurance company.  Inpatient Rehabilitation Care Coordinator:  [name], RN, BSN, [place] [phone]  Information discussed with and copy given to patient by: Margarito Liner, 04/03/2020, 3:17 PM

## 2020-04-03 NOTE — Progress Notes (Signed)
Physical Therapy Session Note  Patient Details  Name: Ethan Fitzgerald MRN: [id number] Date of Birth: 09-17-1955  Today's Date: 04/03/2020 PT Individual Time: 1307-1405 PT Individual Time Calculation (min): 58 min   Short Term Goals: Week 1:  PT Short Term Goal 1 (Week 1): = to LTGs based on ELOS  Skilled Therapeutic Interventions/Progress Updates:   Pt received supine in bed and agreeable to therapy session. Supine>sitting EOB supervision. Donned tennis shoes with supervision and increased time for L UE fine motor coordination. Sit<>stands, no AD, with CGA/close supervision for steadying/safety during session. Gait training ~242ft to main gym, no AD, with CGA and 1x min assist when turning quickly to change directions  - pt demos improved balance with decreased postural sway and even more improved symmetry in gait mechanics.  Participated in the following L UE NMR/fine motor coordination, L attention, and standing balance tasks: - cross body reaching and turning to grasp clothespins from almost floor height to placing on high surface of basketball goal - initially required assist from R hand to steady basketball net, continues with impaired in-hand manipulation to line up clothespin - CGA for steadying/safety but no overt LOB - standing on airex placing large wooden pegs in board - dual-task of removing pegs in specified order with min/mod cuing - does not place pegs in bottom L corner of board automatically requiring cuing to place pegs only on L side to increase attention/awareness of that area - CGA for safety - standing on airex performing bimanual, occupational specific task of screwing nuts on bolts - pt has significant difficulty selecting the correct sizes - CGA for safety but no overt LOB Lateral side stepping with level 3 theraband around knees targeting increased hip abductor strength and dynamic gait challenge with CGA/min assist for steadying. Gait training ~214ft back to room, no AD,  with CGA for steadying. Sit>supine supervision. Pt left supine in bed with needs in reach and bed alarm on.  Therapy Documentation Precautions:  Precautions Precautions: Fall, Other (comment) Precaution Comments: impulsive; Lt inattention  Restrictions Weight Bearing Restrictions: No  Pain: No reports of pain throughout session.   Therapy/Group: Individual Therapy  Tawana Scale , PT, DPT, CSRS  04/03/2020, 12:12 PM

## 2020-04-03 NOTE — Progress Notes (Signed)
Occupational Therapy Session Note  Patient Details  Name: OZELL JUHASZ MRN: [id number] Date of Birth: 1956-08-23  Today's Date: 04/03/2020 OT Individual Time: 0700-0800 OT Individual Time Calculation (min): 60 min    Short Term Goals: Week 1:  OT Short Term Goal 1 (Week 1): STG=LTGs secondary to short estimated LOS  Skilled Therapeutic Interventions/Progress Updates:    Pt resting in bed upon arrival. Pt declined washing up and changing clothing this morning, stating he had shower yesterday. Pt amb without AD (CGA) to w/c and completed grooming at sink.  Pt had difficulty cleaning dentures but was able to complete with extra time.  Pt able to don shoes and fasten without assistance and more then a reasonable amount of time.  Pt with diminished use of LUE/hand specifically with FM tasks.  Pt amb without AD while pushing IV pole to Day Room and engaged in [facility] tasks at table.  Pt issued yellow theraputty with small beads and instructed on tasks with focus on [facility]. Pt also issued 5 tokens and practiced picking up and stacking.  Pt with significant deficits with in hand manipulation tasks.  Pt issued handout on theraputty. Will required reenforcement. Pt returned to room and sat in w/c.  Pt remained in w/c with all needs within reach and belt alarm activated.   Therapy Documentation Precautions:  Precautions Precautions: Fall, Other (comment) Precaution Comments: impulsive; Lt inattention  Restrictions Weight Bearing Restrictions: No  Pain:  Pt denies pain this morning   Therapy/Group: Individual Therapy  Leroy Libman 04/03/2020, 9:02 AM

## 2020-04-03 NOTE — Progress Notes (Signed)
Patient Details  Name: Ethan Fitzgerald MRN: [id number] Date of Birth: 06-12-1956  Today's Date: 04/03/2020  Hospital Problems: Active Problems:   Acute right MCA stroke [facility])  Past Medical History:  Past Medical History:  Diagnosis Date  . COPD (chronic obstructive pulmonary disease) (HCC)    mild  . COVID-19 09/2019  . Erectile dysfunction   . Headache    daily, stress headaches  . Knee pain, left   . Seasonal allergies   . Tobacco dependence    Past Surgical History:  Past Surgical History:  Procedure Laterality Date  . BACK SURGERY     metal plate in back  . COLONOSCOPY    . COLONOSCOPY WITH PROPOFOL N/A 04/26/2019   Procedure: COLONOSCOPY WITH PROPOFOL;  Surgeon: Jonathon Bellows, MD;  Location: [facility] ENDOSCOPY;  Service: Gastroenterology;  Laterality: N/A;  . HERNIA REPAIR Right   . IR ANGIO INTRA EXTRACRAN SEL COM CAROTID INNOMINATE UNI L MOD SED  03/27/2020  . IR ANGIO VERTEBRAL SEL SUBCLAVIAN INNOMINATE UNI L MOD SED  03/27/2020  . IR ANGIO VERTEBRAL SEL VERTEBRAL UNI R MOD SED  03/27/2020  . IR CT HEAD LTD  03/27/2020  . IR PERCUTANEOUS ART THROMBECTOMY/INFUSION INTRACRANIAL INC DIAG ANGIO  03/27/2020  . KNEE ARTHROSCOPY Left 03/28/2015   Procedure: Arthroscopic partial medial meniscectomy plus chondral debridement;  Surgeon: Leanor Kail, MD;  Location: [place];  Service: Orthopedics;  Laterality: Left;  . RADIOLOGY WITH ANESTHESIA N/A 03/27/2020   Procedure: IR WITH ANESTHESIA;  Surgeon: Luanne Bras, MD;  Location: [place];  Service: Radiology;  Laterality: N/A;   Social History:  reports that he quit smoking about 2 years ago. His smoking use included cigarettes. He started smoking about 51 years ago. He has a 73.50 pack-year smoking history. He has quit using smokeless tobacco. He reports current alcohol use. He reports that he does not use drugs.  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 [employer] Read: Yes Write: Yes Employment Status: Employed Name of Employer: [employer] Return to Work Plans: Await clearance from MD for ability to return to work   Abuse/Neglect Abuse/Neglect Assessment Can Be Completed: Yes Physical Abuse: Denies Verbal Abuse: Denies Sexual Abuse: Denies Exploitation of patient/patient's resources: Denies Self-Neglect: Denies  Emotional Status Pt's affect, behavior and adjustment status: Normal affect, behavior and mood Substance Abuse History: ETOH (beer) use  Patient / Family Perceptions, Expectations & Goals Pt/Family understanding of illness & functional limitations: Fair understanding of current health and funtional limitations Premorbid pt/family roles/activities: Active prior to admission, working full-time, driving, etc Anticipated changes in roles/activities/participation: May need supervision for safety at discharge Pt/family expectations/goals: Patient would like to be as independent as possible, able to complete self care solo and assist with home management at discharge  [employer]: None Premorbid Home Care/DME Agencies: None Transportation available at discharge: Wife able to provide transportation at discharge Resource referrals recommended: Neuropsychology  Discharge Planning Living Arrangements: Spouse/significant other Support Systems: Spouse/significant other Type of Residence: Private residence Insurance Resources: Multimedia programmer (specify) ([place]) Financial Resources: Employment Financial Screen Referred: No Living Expenses: Medical laboratory scientific officer Management: Spouse Does the patient have any problems obtaining your medications?: No Home Management: Wife complete cooking however patient reported they  completed cleaning, laundry, etc together (shared duties) Patient/Family Preliminary Plans: Return  home with wife Care Coordinator Anticipated Follow Up Needs: Ethan Fitzgerald Additional Notes/Comments: 1 level home with 1 step entry to home; [name] is preferred at discharge Expected length of stay: 7-10 days  Clinical Impression Nice gentleman that reports had no idea he was at risk for a stroke. Reported his PCP had told him he was doing well with his labs and noted his Triglyceride level had come down from over 301 and he was certain that his U8W levels were under 6. Reported had been working PTA and remodeling his bathroom in the home. Would like to return to work after recovery if possible and be able to help out in the home.  Ethan Fitzgerald B 04/03/2020, 3:31 PM

## 2020-04-04 ENCOUNTER — Inpatient Hospital Stay (HOSPITAL_COMMUNITY): Payer: Medicare HMO | Admitting: Physical Therapy

## 2020-04-04 ENCOUNTER — Inpatient Hospital Stay (HOSPITAL_COMMUNITY): Payer: Medicare HMO

## 2020-04-04 ENCOUNTER — Inpatient Hospital Stay (HOSPITAL_COMMUNITY): Payer: Medicare HMO | Admitting: Speech Pathology

## 2020-04-04 ENCOUNTER — Telehealth: Payer: Self-pay

## 2020-04-04 DIAGNOSIS — I63511 Cerebral infarction due to unspecified occlusion or stenosis of right middle cerebral artery: Secondary | ICD-10-CM | POA: Diagnosis not present

## 2020-04-04 LAB — GLUCOSE, CAPILLARY
Glucose-Capillary: 85 mg/dL (ref 70–99)
Glucose-Capillary: 87 mg/dL (ref 70–99)

## 2020-04-04 MED ORDER — CLOPIDOGREL BISULFATE 75 MG PO TABS
75.0000 mg | ORAL_TABLET | Freq: Every day | ORAL | Status: DC
  Administered 2020-04-05 – 2020-04-09 (×5): 75 mg via ORAL
  Filled 2020-04-04 (×5): qty 1

## 2020-04-04 MED ORDER — BLOOD PRESSURE CONTROL BOOK
Freq: Once | Status: AC
  Filled 2020-04-04: qty 1

## 2020-04-04 NOTE — Progress Notes (Addendum)
[place] PHYSICAL MEDICINE & REHABILITATION PROGRESS NOTE   Subjective/Complaints: Fluid intake 361ml Feels numb left side of face and throat Woke up early for CT head, reviewed findings  Review of systems negative for chest pain shortness of breath nausea vomiting diarrhea Objective:   CT HEAD WO CONTRAST  Result Date: 04/04/2020 CLINICAL DATA:  Follow-up stroke EXAM: CT HEAD WITHOUT CONTRAST TECHNIQUE: Contiguous axial images were obtained from the base of the skull through the vertex without intravenous contrast. COMPARISON:  03/28/2020 FINDINGS: Brain: Again noted are changes consistent with the given clinical history of right middle cerebral artery infarct degree of subarachnoid hemorrhage has nearly completely resolved when compared with the prior exam. No new focal hemorrhage or infarct is seen. No extra-axial fluid collection is noted. Vascular: No hyperdense vessel or unexpected calcification. Skull: Normal. Negative for fracture or focal lesion. Sinuses/Orbits: No acute finding. Other: None. IMPRESSION: Right middle cerebral artery infarct with subarachnoid hemorrhage although the degree of hemorrhage has improved significantly in the interval from the prior exam. No new focal abnormality is seen. Electronically Signed   By: Inez Catalina M.D.   On: 04/04/2020 02:19   Recent Labs    04/02/20 0548  WBC 10.2  HGB 14.8  HCT 42.1  PLT 272   Recent Labs    04/02/20 0548  NA 140  K 3.6  CL 110  CO2 22  GLUCOSE 100*  BUN 19  CREATININE 0.85  CALCIUM 9.3    Intake/Output Summary (Last 24 hours) at 04/04/2020 0918 Last data filed at 04/04/2020 0900 Gross per 24 hour  Intake 240 ml  Output 250 ml  Net -10 ml     Physical Exam: Vital Signs Blood pressure (!) 151/97, pulse 71, temperature 98.1 F (36.7 C), temperature source Oral, resp. rate 16, height 6' (1.829 m), weight 77.6 kg, SpO2 96 %.    General: No acute distress Mood and affect are appropriate Heart:  Regular rate and rhythm no rubs murmurs or extra sounds Lungs: Clear to auscultation, breathing unlabored, no rales or wheezes Abdomen: Positive bowel sounds, soft nontender to palpation, nondistended Extremities: No clubbing, cyanosis, or edema Skin: No evidence of breakdown, no evidence of rash Neurologic:left facial droop Neurologic: Cranial nerves II through XII intact, motor strength is 5/5 in right and 4/5 left deltoid, bicep, tricep, grip, hip flexor, knee extensors, ankle dorsiflexor and plantar flexor Sensory exam normal sensation to light touch in bilateral upper and lower extremities  Musculoskeletal: Pain with abduction left shoulder  Assessment/Plan: 1. Functional deficits secondary to Right MCA infarct which require 3+ hours per day of interdisciplinary therapy in a comprehensive inpatient rehab setting.  Physiatrist is providing close team supervision and 24 hour management of active medical problems listed below.  Physiatrist and rehab team continue to assess barriers to discharge/monitor patient progress toward functional and medical goals  Care Tool:  Bathing  Bathing activity did not occur: Safety/medical concerns Body parts bathed by patient: Right arm, Left arm, Chest, Abdomen, Front perineal area, Buttocks, Right upper leg, Left upper leg, Right lower leg, Face         Bathing assist Assist Level: Minimal Assistance - Patient > 75%     Upper Body Dressing/Undressing Upper body dressing Upper body dressing/undressing activity did not occur (including orthotics): Safety/medical concerns What is the patient wearing?: Pull over shirt    Upper body assist Assist Level: Minimal Assistance - Patient > 75%    Lower Body Dressing/Undressing Lower body dressing  Lower body dressing activity did not occur: N/A What is the patient wearing?: Underwear/pull up, Pants     Lower body assist Assist for lower body dressing: Minimal Assistance - Patient > 75%      Toileting Toileting    Toileting assist Assist for toileting: Minimal Assistance - Patient > 75%     Transfers Chair/bed transfer  Transfers assist  Chair/bed transfer activity did not occur: Safety/medical concerns  Chair/bed transfer assist level: Contact Guard/Touching assist     Locomotion Ambulation   Ambulation assist      Assist level: Contact Guard/Touching assist Assistive device: No Device Max distance: 254ft   Walk 10 feet activity   Assist     Assist level: Contact Guard/Touching assist Assistive device: No Device   Walk 50 feet activity   Assist    Assist level: Contact Guard/Touching assist Assistive device: No Device    Walk 150 feet activity   Assist    Assist level: Contact Guard/Touching assist Assistive device: No Device    Walk 10 feet on uneven surface  activity   Assist     Assist level: Minimal Assistance - Patient > 75% Assistive device: Other (comment) (handrail)   Wheelchair     Assist Will patient use wheelchair at discharge?: No             Wheelchair 50 feet with 2 turns activity    Assist            Wheelchair 150 feet activity     Assist          Blood pressure (!) 151/97, pulse 71, temperature 98.1 F (36.7 C), temperature source Oral, resp. rate 16, height 6' (1.829 m), weight 77.6 kg, SpO2 96 %.    Medical Problem List and Plan: 1.Impaired function and L hemiparesis/dysphagiasecondary to R MCA CVA s/p tPA -patient may shower -ELOS/Goals: 2-2.5 weeks; mod I to supervision 2. Antithrombotics: -DVT/anticoagulation:Pharmaceutical:Lovenox -antiplatelet therapy: ASA alone--to repeat CCT on 07/30 and if SAH resolves to consider DAPT X 3 months. Ask neuro to review images and advise whether plavix can be added to ASA 3.Headaches/Pain Management:Tylenol prn. 4. Mood:LCSW to follow for evaluation and  support. -antipsychotic agents: N/A 5. Neuropsych: This patientIs not fullycapable of making decisions onhisown behalf. 6. Skin/Wound Care:Routine pressure relief measures. 7. Fluids/Electrolytes/Nutrition:Monitor I/O. Check lytes in am as on modified diet. May d/c CBG 8. DIY:MEBR recent hypotension intermittently-Long term BP goal 130-150 due to carotid stenosis.Monitor BP tid --continue  9. Centrilobar Emphysema: On maintenance inhaler (Stiolto respimat) at home-->resume  10. Hyperlipidemia:On Crestor 11. Parotid mass: Follow up with ENT after discharge.  12. Right shoulder pain: Question RTC strain--will add voltaren gel with ice for local measures.  13. Dysphagia: On Dysphagia 3, honey liquids --encourage fluid intake. Single ice chips after oral care.  Because of low liquid intake continue IV fluids low rate 50 mL/h 14. Constipation:  Last bowel movement 7/28    LOS: 3 days A FACE TO FACE EVALUATION WAS PERFORMED  Ethan Fitzgerald 04/04/2020, 9:18 AM

## 2020-04-04 NOTE — Progress Notes (Signed)
Speech Language Pathology Daily Session Note  Patient Details  Name: Ethan Fitzgerald MRN: [id number] Date of Birth: March 06, 1956  Today's Date: 04/04/2020 SLP Individual Time: 0700-0744 SLP Individual Time Calculation (min): 44 min  Short Term Goals: Week 1: SLP Short Term Goal 1 (Week 1): STG=LTG due to short ELOS  Skilled Therapeutic Interventions: Pt was seen for skilled ST targeting dysphagia and speech goals. After completing oral care and donning dentures at sink, pt accepted trials of thin H2O. Pt consumed 4 oz with 2 immediate cough responses. In review of last MBSS, aspiration of thin was silent, so cough responses may or may not indicate increased sensation. He also reports COPD dx, which could also influence congested cough, as some cough noted to occur at baseline in absence of PO. Recommend repeat MBSS on Monday 04/07/20 to reassess pharyngeal swallow function and potential for liquid advancement prior to discharge (which is scheduled for 04/09/20). During skilled observation of pt consuming Dys 3/honey thick liquid breakfast tray, Min A verbal cues were required for pt to utilize finger sweep to clear left buccal pocketing of solids. Speech intelligibility targeted in functional conversation today; intelligibility increased throughout session, with Min faded to Supervision A verbal cues required for use of slow rate and overarticulation to achieve ~85% intelligibility. Pt left sitting in chair with seatbelt alarm in place and needs met. Continue per current plan of care.        Pain Pain Assessment Pain Scale: 0-10 Pain Score: 0-No pain  Therapy/Group: Individual Therapy  Arbutus Leas 04/04/2020, 6:43 AM

## 2020-04-04 NOTE — Progress Notes (Signed)
Occupational Therapy Session Note  Patient Details  Name: Ethan Fitzgerald MRN: [id number] Date of Birth: 1955/10/08  Today's Date: 04/04/2020 OT Individual Time: 0800-0900 OT Individual Time Calculation (min): 60 min    Short Term Goals: Week 1:  OT Short Term Goal 1 (Week 1): STG=LTGs secondary to short estimated LOS  Skilled Therapeutic Interventions/Progress Updates:    Pt resting in w/c upon arrival and ready for therapy.  OT intervention with focus on BADL retraining, functional amb without AD, BUE tasks with emphasis on improved LUE [facility] tasks. Pt gathered clothing and amb into bathroom without AD and with CGA. Pt completed bathing at shower level with CGA and min verbal cues for safety awareness.Pt completed dressing with sit<>stand and CGA. Pt requires min verbal cues for attention to L. Pt completed 9 hole peg test and box and block test: 9 hole-r 30.4 secs, L 68.2 secs; box and block-R 36, L 26. Pt commented that his LUE "got tired" with box and block test. Pt returned to room and requested to return to bed.  Pt remained in bed with all needs within reach and bed alarm activated.   Therapy Documentation Precautions:  Precautions Precautions: Fall, Other (comment) Precaution Comments: impulsive; Lt inattention  Restrictions Weight Bearing Restrictions: No  Pain: Pain Assessment Pain Scale: 0-10 Pain Score: 0-No pain   Therapy/Group: Individual Therapy  Leroy Libman 04/04/2020, 10:14 AM

## 2020-04-04 NOTE — Progress Notes (Signed)
Physical Therapy Session Note  Patient Details  Name: Ethan Fitzgerald MRN: [id number] Date of Birth: March 28, 1956  Today's Date: 04/04/2020 PT Individual Time: 1310-1407 and 0258-5277 PT Individual Time Calculation (min): 57 min and 53 min   Short Term Goals: Week 1:  PT Short Term Goal 1 (Week 1): = to LTGs based on ELOS  Skilled Therapeutic Interventions/Progress Updates:    Session 1: Pt received supine in bed and agreeable to therapy session. Supine>sitting EOB supervision. Donned tennis shoes without assist but increased time for L UE coordination. Sit<>stands supervision. Gait ~33ft, no AD, to ortho gym with CGA/close supervision - continues to demo improving gait mechanics and improving balance. Standing balance and L UE coordination task using dynavision 3x 1 minute trials with initial 3 second targets decreased to 2.3seconds but that was too challenging with pt only able to get 12/26 targets but with 2.7seconds able to get 24/28 - demos slower reaction time in both lower quadrants compared to the upper quadrants.  Performed the following dynamic standing balance tasks: - 4 square stepping and diagonal stepping over hockey sticks with CGA for safety but no overt LOB - 4 square tapping of external targets with dual-cognitive task of varying type of cue used to name target (color vs number) and having pt turn 90degrees and recall location of targets without looking Dynamic standing balance and dual-cognitive task of having pt complete 3 different puzzles (mild complexity PEG board pattern, card matching, and easy complexity pipe tree pattern) at the same time via going from one to another and only placing 1 piece at a time - close supervision/CGA for safety during, no overt LOB - pt able to divide attention and recall what step he was working on for each puzzle - most difficulty with PEG board pattern. Gait ~215ft back to room, no AD, with CGA/close supervision. Sit>supine supervision. Pt left  supine in bed with needs in reach and bed alarm on.  Session 2: Pt received supine in bed with his wife present and pt agreeable to therapy session. Pt's wife attended session for education in preparation for D/C next week. Pt's wife educated on pt's CLOF and primary impairments with recommendation for follow-up OPPT. Supine>sit R EOB supervision. Donned shoes without assist but increased time for L hand coordination. Sit<>stands with supervision throughout session. Gait ~44ft x2 to/from bathroom, no AD, with CGA/close supervision. Standing with close supervision for balance safety performed LB clothing management and continent of bladder. Standing hand hygiene at sink with close supervision for safety. Gait ~210ft to main therapy gym, no AD, with CGA/close supervision. Educated pt/wife on fall risk safety in the home, when to call 911, and how to safely perform floor transfer - pt demos and verbalizes understanding with supervision and mod cuing. Noticed pt's L eyelid does not close fully - pt's wife reports she also noticed that prior to session when pt taking a nap, stating that is new - will notify PA/MD and follow-up.  Performed the following dynamic gait challenges, no AD, with CGA/close supervision: - obstacle course including stepping on/off airex and 4" step, stepping over hockey stick, tossing ball into rebounder, picking up objects from low to high surfaces with L hand, ascending/descending 4 steps using 1 UE support with reciprocal stepping pattern, carrying 3lb weight in L hand during the prior tasks - ambulating while balancing ball on tennis racket - this was significantly difficult for pt and he was unable to perform >5 seconds - cued to decrease speed of  walking to allow increased focus and control of L UE  Dynamic standing balance and L UE coordination task of catching ball with L hand supervision for safety. Gait ~211ft back to room, no AD, with CGA/close supervision. Pt's wife reports no  questions/concerns for therapist and requests to speak with MD/PA regarding medical update - will notify them. Pt left sitting in w/c with needs in reach, seat belt alarm on, his wife present, and RN entering.  Therapy Documentation Precautions:  Precautions Precautions: Fall, Other (comment) Precaution Comments: impulsive; Lt inattention  Restrictions Weight Bearing Restrictions: No  Pain: Session 1: Denies pain during session.  Session 2: Denies pain during session.   Therapy/Group: Individual Therapy  Tawana Scale , PT, DPT, CSRS  04/04/2020, 12:14 PM

## 2020-04-04 NOTE — Progress Notes (Signed)
As per Neuro Dr Erlinda Hong, CT looking better [facility] nearly resolved, will start Plavix in am

## 2020-04-04 NOTE — Plan of Care (Signed)
  Problem: Sit to Stand Goal: LTG:  Patient will perform sit to stand with assistance level (PT) Description: LTG:  Patient will perform sit to stand with assistance level (PT) Flowsheets (Taken 04/04/2020 1829) LTG: PT will perform sit to stand in preparation for functional mobility with assistance level: (upgraded based on pt progress) Independent Note: upgraded based on pt progress   Problem: RH Bed to Chair Transfers Goal: LTG Patient will perform bed/chair transfers w/assist (PT) Description: LTG: Patient will perform bed to chair transfers with assistance (PT). Flowsheets (Taken 04/04/2020 1829) LTG: Pt will perform Bed to Chair Transfers with assistance level: (upgraded based on pt progress) Independent Note: upgraded based on pt progress

## 2020-04-04 NOTE — IPOC Note (Signed)
Overall Plan of Care [facility]) Patient Details Name: Ethan Fitzgerald MRN: [id number] DOB: 1956/06/07  Admitting Diagnosis: Acute right MCA stroke [facility])  Hospital Problems: Principal Problem:   Acute right MCA stroke ([place])     Functional Problem List: Nursing Safety, Sensory, Motor, Medication Management  PT Balance, Perception, Behavior, Safety, Edema, Sensory, Endurance, Skin Integrity, Motor, Nutrition, Pain  OT Balance, Behavior, Cognition, Endurance, Motor, Safety, Sensory, Vision  SLP    TR         Basic ADL's: OT Grooming, Bathing, Dressing, Toileting, Eating     Advanced  ADL's: OT Simple Meal Preparation     Transfers: PT Bed Mobility, Bed to Chair, Car, Furniture, Futures trader, Metallurgist: PT Ambulation, Stairs     Additional Impairments: OT Fuctional Use of Upper Extremity  SLP Communication, Swallowing expression    TR      Anticipated Outcomes Item Anticipated Outcome  Self Feeding S  Swallowing  Supervision A   Basic self-care  S  Toileting  S   Bathroom Transfers S  Bowel/Bladder  Will remain continent  Transfers  supervision  Locomotion  supervision  Communication  Supervision A  Cognition     Pain  pain will be managed  within an acceptable level  Safety/Judgment  Patient will have no falls with injury   Therapy Plan: PT Intensity: Minimum of 1-2 x/day ,45 to 90 minutes PT Frequency: 5 out of 7 days PT Duration Estimated Length of Stay: ~7 days OT Intensity: Minimum of 1-2 x/day, 45 to 90 minutes OT Frequency: 5 out of 7 days OT Duration/Estimated Length of Stay: 7-10 days SLP Intensity: Minumum of 1-2 x/day, 30 to 90 minutes SLP Frequency: 3 to 5 out of 7 days SLP Duration/Estimated Length of Stay: 8/4   Due to the current state of emergency, patients may not be receiving their 3-hours of Medicare-mandated therapy.   Team Interventions: Nursing Interventions Patient/Family Education, Disease  Management/Prevention, Medication Management, Dysphagia/Aspiration Precaution Training  PT interventions Ambulation/gait training, Community reintegration, DME/adaptive equipment instruction, Neuromuscular re-education, Psychosocial support, Stair training, UE/LE Strength taining/ROM, Training and development officer, Discharge planning, Functional electrical stimulation, Pain management, Skin care/wound management, Therapeutic Activities, UE/LE Coordination activities, Cognitive remediation/compensation, Disease management/prevention, Functional mobility training, Patient/family education, Splinting/orthotics, Therapeutic Exercise, Visual/perceptual remediation/compensation  OT Interventions Balance/vestibular training, Self Care/advanced ADL retraining, UE/LE Coordination activities, Cognitive remediation/compensation, Functional mobility training, Visual/perceptual remediation/compensation, Community reintegration, Neuromuscular re-education, Wheelchair propulsion/positioning, Discharge planning, Therapeutic Activities, Patient/family education, Therapeutic Exercise, DME/adaptive equipment instruction, Psychosocial support, UE/LE Strength taining/ROM  SLP Interventions Cueing hierarchy, Speech/Language facilitation, Dysphagia/aspiration precaution training, Patient/family education  TR Interventions    SW/CM Interventions Disease Management/Prevention, Discharge Planning, Psychosocial Support, Patient/Family Education   Barriers to Discharge MD  Medical stability, Nutritional means and poor intake requiring IV supplementation   Nursing      PT Nutrition means, Neurogenic Bowel & Bladder, Incontinence    OT Other (comments) none known at this time  SLP      SW       Team Discharge Planning: Destination: PT-Home ,OT- Home , SLP-Home Projected Follow-up: PT-Outpatient PT, 24 hour supervision/assistance, OT-  Outpatient OT, 24 hour supervision/assistance, SLP-Home Health SLP, 24 hour  supervision/assistance, Outpatient SLP (supervision A for swallow) Projected Equipment Needs: PT-To be determined, OT- To be determined, SLP-None recommended by SLP Equipment Details: PT- , OT-  Patient/family involved in discharge planning: PT- Patient,  OT-Family member/caregiver, Patient, SLP-Patient  MD ELOS: 7-10d Medical Rehab Prognosis:  Excellent Assessment:  88 year oldLH-male with history of COPD, daily HA, dyslipidemia, recent bronchitis with coughing episodes who was admitted on 03/27/20 with left sided weakness,difficulty walking and difficulty speaking. CT head showed dense M1 segment with occlusive thrombus, occlusion of left ICA age-indeterminate distal reconstitution of cavernous segment and good perfusion of left-MCA and bilateral-ACAs. He received TPA and underwent cerebral angio with partial revascularization of R-MCA dominant division as well as attempts at revascularization balloon angioplasty of acutely occluded proximal right ICA with reocclusion. Follow-up MRI brain showed acute cortical/subcortical right-MCA infarct, additional small infarcts in right caudate nucleus, right pre and postcentral gyri and mid to anterior frontal lobe, SAH and incidental findings of right parotid neoplasm. Follow-up CT head showed an change in right MCA infarct and stable SAH. 2D echo was technically difficult but showed EF of 55 to 60%.   See Team Conference Notes for weekly updates to the plan of care

## 2020-04-05 ENCOUNTER — Inpatient Hospital Stay (HOSPITAL_COMMUNITY): Payer: Medicare HMO

## 2020-04-05 ENCOUNTER — Inpatient Hospital Stay (HOSPITAL_COMMUNITY): Payer: Medicare HMO | Admitting: Speech Pathology

## 2020-04-05 ENCOUNTER — Inpatient Hospital Stay (HOSPITAL_COMMUNITY): Payer: Medicare HMO | Admitting: Occupational Therapy

## 2020-04-05 DIAGNOSIS — I63511 Cerebral infarction due to unspecified occlusion or stenosis of right middle cerebral artery: Secondary | ICD-10-CM | POA: Diagnosis not present

## 2020-04-05 MED ORDER — SODIUM CHLORIDE 0.9 % NICU IV INFUSION SIMPLE
INJECTION | INTRAVENOUS | Status: AC
  Filled 2020-04-05 (×5): qty 500

## 2020-04-05 NOTE — Progress Notes (Signed)
Physical Therapy Session Note  Patient Details  Name: Ethan Fitzgerald MRN: [id number] Date of Birth: [birth year]  Today's Date: 04/05/2020 PT Individual Time: 1300-1400 PT Individual Time Calculation: 60 min  Short Term Goals: Week 1:  PT Short Term Goal 1 (Week 1): = to LTGs based on ELOS  Skilled Therapeutic Interventions/Progress Updates:     Patient in recliner in the room upon PT arrival. Patient alert and agreeable to PT session. Patient denied pain during session.  Therapeutic Activity: Transfers: Patient performed sit to/from stand x7 with supervision-mod I without an AD throughout session. Provided verbal cues for reaching back to sit x2 to ensure where he was sitting was behind him and stable for safety due to increased impulsivity with mobility.  Gait Training:  Patient ambulated >200 feet x4 without an AD with supervision. Ambulated with increased BOS, and decreased visual scanning to the L. Focused on increased visual scanning to the L on 2-4 trials placing 10 red discs in various locations along his route on the L. First trial did not provide cues for scanning L and patient missed all discs, second trial cued patient to scan L and R and patient located 2 discs with visual scanning L <50% of the time, third trial provided cues for only scanning L and patient found 7 discs with visual scanning L 100% of the time. Educated on implications of the exercise in real world setting for safety while walking and justification for not returning to driving upon d/c. Patient stated understanding and in agreement with driving recommendations. Does report that his goal is to return to driving in order to return to work. Discussed setting goals with follow-up therapy work toward these goals.  Neuromuscular Re-ed: Patient performed the following dynamic standing balance activities for improved use of balance strategies in standing and L UE motor control: -Standing with forefoot on medium blue foam  wedge, reaching to contralateral side to pick up a colored clothes pin with his L hand and place on basket ball net at highest setting, once all were attached he then reversed the process; selected 2x5 colors and assigned different categories to each color for patient to name things in those categories when handling that color, required mod-max cues for recall of category 50% of the time. Required supervision for balance throughout and multiple trials with pins with increased resistance for placement with L hand -Demonstrated standing on level end of Bosu ball and tossing mesh covered ball at the rebounder while counting backwards by 3's. Patient performed the following progression in the //bars:  Standing on Bosu with B UE support with CGA  Standing on Bosu with single UE support with CGA-min A  Standing on Bosu without UE support and cues for slgith knee bend and use of ankle and hip strategies to maintain balance with CGA-min A  Standing on Bosu throwing ball at rebounder with CGA-min A and improved use balance strategies  Standing on Bosu throwing ball at rebounder while counting backwards by 3s from 72 (all correct) with CGA-min A  Patient required 2 sitting rest breaks between NMR activities. During rest breaks discusses stroke risk factors, stress management strategies, smoking cessation, alcohol cessation, and removal of firearms in the home for safety during recovery. Patient receptive and appreciative of education.   Patient seated EOB at end of session with breaks locked, bed alarm set, and all needs within reach.    Therapy Documentation Precautions:  Precautions Precautions: Fall, Other (comment) Precaution Comments: impulsive; Lt inattention  Restrictions Weight Bearing Restrictions: No   Therapy/Group: Individual Therapy  Glynnis Gavel L Ellie Bryand PT, DPT  04/05/2020, 4:33 PM

## 2020-04-05 NOTE — Progress Notes (Signed)
Speech Language Pathology Daily Session Note  Patient Details  Name: Ethan Fitzgerald MRN: [id number] Date of Birth: [birth year]  Today's Date: 04/05/2020 SLP Individual Time: 0950-1015 SLP Individual Time Calculation (min): 25 min  Short Term Goals: Week 1: SLP Short Term Goal 1 (Week 1): STG=LTG due to short ELOS  Skilled Therapeutic Interventions:   Pt was seen for skilled ST targeting dysphagia goals.  SLP facilitated the session with trials of sips of water following oral care to continue working towards diet progression.  Pt had x1 initial cough over ~8 oz of thin liquids with supervision cues for use of swallowing precautions.  During trials of thin liquids, SLP initiated skilled education regarding pharyngeal strengthening exercises given noted deficits in BOT retraction on MBS.  Pt returned demonstration of the Masako, Caryl Ada, Effortful swallow, and Chin tuck against resistance maneuvers with min assist instructional cues.  Pt was left with a handout to maximize carryover in between therapy sessions.  Pt was left in bed with bed alarm set and call bell within reach.  Continue per current plan of care.    Pain Pain Assessment Pain Scale: 0-10 Pain Score: 0-No pain  Therapy/Group: Individual Therapy  Passion Lavin, Selinda Orion 04/05/2020, 12:18 PM

## 2020-04-05 NOTE — Progress Notes (Signed)
[facility] PHYSICAL MEDICINE & REHABILITATION PROGRESS NOTE   Subjective/Complaints:  Pt reports slept OK- L shoulder still a little bothersome, but getting better- The cream at night really helps.  Voiding OK- but not eating much/poor appetite, so LBM 4+ days ago-  Has MBS Monday per pt.     Review of systems  Pt denies SOB, abd pain, CP, N/V/C/D, and vision changes  Objective:   CT HEAD WO CONTRAST  Result Date: 04/04/2020 CLINICAL DATA:  Follow-up stroke EXAM: CT HEAD WITHOUT CONTRAST TECHNIQUE: Contiguous axial images were obtained from the base of the skull through the vertex without intravenous contrast. COMPARISON:  03/28/2020 FINDINGS: Brain: Again noted are changes consistent with the given clinical history of right middle cerebral artery infarct degree of subarachnoid hemorrhage has nearly completely resolved when compared with the prior exam. No new focal hemorrhage or infarct is seen. No extra-axial fluid collection is noted. Vascular: No hyperdense vessel or unexpected calcification. Skull: Normal. Negative for fracture or focal lesion. Sinuses/Orbits: No acute finding. Other: None. IMPRESSION: Right middle cerebral artery infarct with subarachnoid hemorrhage although the degree of hemorrhage has improved significantly in the interval from the prior exam. No new focal abnormality is seen. Electronically Signed   By: Inez Catalina M.D.   On: 04/04/2020 02:19   No results for input(s): WBC, HGB, HCT, PLT in the last 72 hours. No results for input(s): NA, K, CL, CO2, GLUCOSE, BUN, CREATININE, CALCIUM in the last 72 hours.  Intake/Output Summary (Last 24 hours) at 04/05/2020 1413 Last data filed at 04/05/2020 0900 Gross per 24 hour  Intake 342 ml  Output --  Net 342 ml     Physical Exam: Vital Signs Blood pressure (!) 138/92, pulse 66, temperature 97.9 F (36.6 C), temperature source Oral, resp. rate 16, height 6' (1.829 m), weight 77.6 kg, SpO2 95 %.    General: No  acute distress- sitting up in bedside chair, appropriate, NAD Mood and affect are appropriate Heart: RRR Lungs: CTA B/L- no W/R/R- good air movement Abdomen: Soft, NT, ND, (+)BS  Extremities: No clubbing, cyanosis, or edema Skin: No evidence of breakdown, no evidence of rash Neurologic:mild to moderate L facial droop still noted Neurologic: Cranial nerves II through XII intact, motor strength is 5/5 in right and 4/5 left deltoid, bicep, tricep, grip, hip flexor, knee extensors, ankle dorsiflexor and plantar flexor Sensory exam normal sensation to light touch in bilateral upper and lower extremities  Musculoskeletal: Pain with abduction left shoulder- TTP over posterior shoulder/RTC  Assessment/Plan: 1. Functional deficits secondary to Right MCA infarct which require 3+ hours per day of interdisciplinary therapy in a comprehensive inpatient rehab setting.  Physiatrist is providing close team supervision and 24 hour management of active medical problems listed below.  Physiatrist and rehab team continue to assess barriers to discharge/monitor patient progress toward functional and medical goals  Care Tool:  Bathing  Bathing activity did not occur: Refused Body parts bathed by patient: Right arm, Left arm, Chest, Abdomen, Front perineal area, Buttocks, Right upper leg, Left upper leg, Right lower leg, Face, Left lower leg         Bathing assist Assist Level: Contact Guard/Touching assist     Upper Body Dressing/Undressing Upper body dressing   What is the patient wearing?: Pull over shirt    Upper body assist Assist Level: Supervision/Verbal cueing    Lower Body Dressing/Undressing Lower body dressing      What is the patient wearing?: Underwear/pull up, Pants  Lower body assist Assist for lower body dressing: Contact Guard/Touching assist     Toileting Toileting    Toileting assist Assist for toileting: Supervision/Verbal cueing     Transfers Chair/bed  transfer  Transfers assist     Chair/bed transfer assist level: Contact Guard/Touching assist     Locomotion Ambulation   Ambulation assist      Assist level: Contact Guard/Touching assist Assistive device: No Device Max distance: 274ft   Walk 10 feet activity   Assist     Assist level: Contact Guard/Touching assist Assistive device: No Device   Walk 50 feet activity   Assist    Assist level: Contact Guard/Touching assist Assistive device: No Device    Walk 150 feet activity   Assist    Assist level: Contact Guard/Touching assist Assistive device: No Device    Walk 10 feet on uneven surface  activity   Assist     Assist level: Minimal Assistance - Patient > 75% Assistive device: Other (comment) (handrail)   Wheelchair     Assist Will patient use wheelchair at discharge?: No             Wheelchair 50 feet with 2 turns activity    Assist            Wheelchair 150 feet activity     Assist          Blood pressure (!) 138/92, pulse 66, temperature 97.9 F (36.6 C), temperature source Oral, resp. rate 16, height 6' (1.829 m), weight 77.6 kg, SpO2 95 %.    Medical Problem List and Plan: 1.Impaired function and L hemiparesis/dysphagiasecondary to R MCA CVA s/p tPA -patient may shower -ELOS/Goals: 2-2.5 weeks; mod I to supervision 2. Antithrombotics: -DVT/anticoagulation:Pharmaceutical:Lovenox -antiplatelet therapy: ASA alone--to repeat CCT on 07/30 and if SAH resolves to consider DAPT X 3 months. Ask neuro to review images and advise whether plavix can be added to ASA  7/31- Plavix started this AM.   3.Headaches/Pain Management:Tylenol prn. 4. Mood:LCSW to follow for evaluation and support. -antipsychotic agents: N/A 5. Neuropsych: This patientIs not fullycapable of making decisions onhisown behalf. 6. Skin/Wound Care:Routine pressure relief  measures. 7. Fluids/Electrolytes/Nutrition:Monitor I/O. Check lytes in am as on modified diet. May d/c CBG 8. PNT:IRWE recent hypotension intermittently-Long term BP goal 130-150 due to carotid stenosis.Monitor BP tid --continue  9. Centrilobar Emphysema: On maintenance inhaler (Stiolto respimat) at home-->resume  10. Hyperlipidemia:On Crestor 11. Parotid mass: Follow up with ENT after discharge.  12. Right shoulder pain: Question RTC strain--will add voltaren gel with ice for local measures.  7/31- says voltaren gel very helpful- will con't  13. Dysphagia: On Dysphagia 3, honey liquids --encourage fluid intake. Single ice chips after oral care.  Because of low liquid intake continue IV fluids low rate 50 mL/h  7/31- said they stopped his IV- don't see on Orders- will check labs Monday to make sure not dry and restart for now.  14. Constipation:  Last bowel movement 7/28  7/31- says not eating well/poor appetite- if no BM by tomorrow, will intervene.     LOS: 4 days A FACE TO FACE EVALUATION WAS PERFORMED  Ethan Fitzgerald 04/05/2020, 2:13 PM

## 2020-04-05 NOTE — Progress Notes (Signed)
Occupational Therapy Session Note  Patient Details  Name: Ethan Fitzgerald MRN: [id number] Date of Birth: Jun 06, 1956  Today's Date: 04/05/2020 OT Individual Time: [phone] and 2947-6546 OT Individual Time Calculation (min): 56 min and 42 min  Short Term Goals: Week 1:  OT Short Term Goal 1 (Week 1): STG=LTGs secondary to short estimated LOS  Skilled Therapeutic Interventions/Progress Updates:    Pt greeted in bed with no c/o pain, declining breakfast. He wanted to start session by using the restroom. His bed was set up like his bed at home, which has an adjustable HOB feature, no bedrails. Pt transferred out of bed with setup assistance. He retrieved sneakers from floor using his Lt hand and donned/tied them with increased time for the laces. Ambulatory transfer to toilet without AD completed with CGA, close supervision for balance during bladder void/clothing mgt in standing. CGA-close supervision for ambulatory transfer to the sink after to wash hands and complete oral care. Note that pt rinsed mouth x3 with thin water with 1 small bout of coughing after tilting head back to gargle mouthwash. While seated EOB, pt used the Lt hand to find his razor in personal bag and shave. Pt used Lt hand at dominant/nondominant level during stated tasks with min cuing. To continue working on [employer] and dynamic standing balance had pt strip linen from bed and apply clean linen, pt ambulating around room with supervision-CGA, stooping to floor to retrieve laundry bag/linen that was dropped, and bending outside of base of support to meet demands of task. Pt required min vcs for problem solving and correctly orienting sheets/blankets. Increased time to meet fine motor demands on the Lt side without visual attendance while donning pillow cases sitting EOB. He needed 1 seated rest break due to standing fatigue. Pt then requested to weigh himself, noted decreased proprioceptive awareness with the Lt hand when applying his  face mask, needed to use his Rt hand to strap mask around Lt ear. Pt then ambulated with close supervision-CGA down hallway without AD, noted fast pace and occasional small LOBs towards the Lt. After pt weighed himself on the scale, he found his way back to room without cues. Pt returned to bed and was left with all needs within reach and bed alarm set. Tx focus placed on Lt NMR, dynamic balance, functional cognition, and activity tolerance.   2nd Session 1:1 tx (42 min) Pt greeted in the bed, no c/o pain and ADL needs met. Requesting to go outside during session. He ambulated without device and close supervision-CGA to the outdoor patio, vcs for Lt attention when changing hallways or finding doorways/landmarks to guide the way. While outdoors, worked on higher level balance when ambulating over uneven concrete, grass, mulch, and up/down small curb. Pt required CGA-close supervision for balance throughout, vcs still for Lt attention as pt would ambulate towards rightmost aspect of sidewalk and narrowly miss obstacles on his Lt side. While sitting, worked on [employer] and [facility] via writing his signature. Pt reports having messy writing at baseline, noted FM fatigue/decreased tripod grasp strength with blocked practice of signature writing, evidenced by decreased pressure of pen and lighter appearance of letters. At end of session pt required vcs for pathfinding his way back to the unit, left him in bed with all needs within reach and bed alarm set.     Therapy Documentation Precautions:  Precautions Precautions: Fall, Other (comment) Precaution Comments: impulsive; Lt inattention  Restrictions Weight Bearing Restrictions: No Pain: Pain Assessment Pain Scale:  0-10 Pain Score: 0-No pain ADL:       Therapy/Group: Individual Therapy  Toma Arts A Symeon Puleo 04/05/2020, 12:22 PM

## 2020-04-06 DIAGNOSIS — I63511 Cerebral infarction due to unspecified occlusion or stenosis of right middle cerebral artery: Secondary | ICD-10-CM | POA: Diagnosis not present

## 2020-04-06 NOTE — Progress Notes (Signed)
Slept good. NS infusing at 50cc's/hr. Declined LOC, "I'll go when I go." voltaren gel applied to left shoulder. No unsafe behaviors observed. Ethan Fitzgerald A

## 2020-04-06 NOTE — Progress Notes (Signed)
[place] PHYSICAL MEDICINE & REHABILITATION PROGRESS NOTE   Subjective/Complaints:  Pt reports doing well- no therapy today- wife is coming in to spend the day with him- he asked if he can go outside for a short time- I explained I'm ok with this- told nursing, however suggest holding IVFs for ~ 30 minutes when goes outside.   As long as WIFE takes him outside.  Shoulder pain is OK with voltaren gel    Review of systems  Pt denies SOB, abd pain, CP, N/V/C/D, and vision changes   Objective:   No results found. No results for input(s): WBC, HGB, HCT, PLT in the last 72 hours. No results for input(s): NA, K, CL, CO2, GLUCOSE, BUN, CREATININE, CALCIUM in the last 72 hours.  Intake/Output Summary (Last 24 hours) at 04/06/2020 1402 Last data filed at 04/06/2020 0755 Gross per 24 hour  Intake 739.49 ml  Output --  Net 739.49 ml     Physical Exam: Vital Signs Blood pressure (!) 145/92, pulse 71, temperature 98.8 F (37.1 C), resp. rate 18, height 6' (1.829 m), weight 74.3 kg, SpO2 99 %.    General: sitting up in bed- appropriate, flat affect, asking appropriate, questions, NAD Mood and affect are appropriate but  A little flat Heart: RRR Lungs: CTA B/L- no W/R/R- good air movement Abdomen: Soft, NT, ND, (+)BS   Extremities: No clubbing, cyanosis, or edema Skin: No evidence of breakdown, no evidence of rash Neurologic:mild to moderate L facial droop still noted Neurologic: Cranial nerves II through XII intact, motor strength is 5/5 in right and 4/5 left deltoid, bicep, tricep, grip, hip flexor, knee extensors, ankle dorsiflexor and plantar flexor Sensory exam normal sensation to light touch in bilateral upper and lower extremities  Musculoskeletal: Pain with abduction left shoulder- TTP over posterior shoulder/RTC- slightly less TTP  Assessment/Plan: 1. Functional deficits secondary to Right MCA infarct which require 3+ hours per day of interdisciplinary therapy in a  comprehensive inpatient rehab setting.  Physiatrist is providing close team supervision and 24 hour management of active medical problems listed below.  Physiatrist and rehab team continue to assess barriers to discharge/monitor patient progress toward functional and medical goals  Care Tool:  Bathing  Bathing activity did not occur: Refused Body parts bathed by patient: Right arm, Left arm, Chest, Abdomen, Front perineal area, Buttocks, Right upper leg, Left upper leg, Right lower leg, Face, Left lower leg         Bathing assist Assist Level: Contact Guard/Touching assist     Upper Body Dressing/Undressing Upper body dressing   What is the patient wearing?: Pull over shirt    Upper body assist Assist Level: Supervision/Verbal cueing    Lower Body Dressing/Undressing Lower body dressing      What is the patient wearing?: Underwear/pull up, Pants     Lower body assist Assist for lower body dressing: Contact Guard/Touching assist     Toileting Toileting    Toileting assist Assist for toileting: Supervision/Verbal cueing     Transfers Chair/bed transfer  Transfers assist     Chair/bed transfer assist level: Contact Guard/Touching assist     Locomotion Ambulation   Ambulation assist      Assist level: Contact Guard/Touching assist Assistive device: No Device Max distance: 244ft   Walk 10 feet activity   Assist     Assist level: Contact Guard/Touching assist Assistive device: No Device   Walk 50 feet activity   Assist    Assist level: Contact Guard/Touching assist  Assistive device: No Device    Walk 150 feet activity   Assist    Assist level: Contact Guard/Touching assist Assistive device: No Device    Walk 10 feet on uneven surface  activity   Assist     Assist level: Minimal Assistance - Patient > 75% Assistive device: Other (comment) (handrail)   Wheelchair     Assist Will patient use wheelchair at discharge?: No              Wheelchair 50 feet with 2 turns activity    Assist            Wheelchair 150 feet activity     Assist          Blood pressure (!) 145/92, pulse 71, temperature 98.8 F (37.1 C), resp. rate 18, height 6' (1.829 m), weight 74.3 kg, SpO2 99 %.    Medical Problem List and Plan: 1.Impaired function and L hemiparesis/dysphagiasecondary to R MCA CVA s/p tPA -patient may shower -ELOS/Goals: 2-2.5 weeks; mod I to supervision 2. Antithrombotics: -DVT/anticoagulation:Pharmaceutical:Lovenox -antiplatelet therapy: ASA alone--to repeat CCT on 07/30 and if SAH resolves to consider DAPT X 3 months. Ask neuro to review images and advise whether plavix can be added to ASA  7/31- Plavix started this AM.   8/1- no signs of bleeding  3.Headaches/Pain Management:Tylenol prn. 4. Mood:LCSW to follow for evaluation and support. -antipsychotic agents: N/A 5. Neuropsych: This patientIs not fullycapable of making decisions onhisown behalf. 6. Skin/Wound Care:Routine pressure relief measures. 7. Fluids/Electrolytes/Nutrition:Monitor I/O. Check lytes in am as on modified diet. May d/c CBG 8. CBS:WHQP recent hypotension intermittently-Long term BP goal 130-150 due to carotid stenosis.Monitor BP tid --continue  8/1- BP 145/92- slightly elevated but still in goal- so won't change meds  9. Centrilobar Emphysema: On maintenance inhaler (Stiolto respimat) at home-->resume  10. Hyperlipidemia:On Crestor 11. Parotid mass: Follow up with ENT after discharge.  12. Right shoulder pain: Question RTC strain--will add voltaren gel with ice for local measures.  7/31- says voltaren gel very helpful- will con't  13. Dysphagia: On Dysphagia 3, honey liquids --encourage fluid intake. Single ice chips after oral care.  Because of low liquid intake continue IV fluids low rate 50 mL/h  7/31- said they stopped his IV- don't see  on Orders- will check labs Monday to make sure not dry and restart for now.  8/1- can hold for ~ 30 minutes to go outside, otherwise, since still on honey thick, don't want to stop IVFs until MBS for now.   14. Constipation:  Last bowel movement 7/28  7/31- says not eating well/poor appetite- if no BM by tomorrow, will intervene.   8/1- LBM actually 7/29- so needs to have BM by tomorrow- has been refusing bowel meds per nursing note.  So, that doesn't help.     LOS: 5 days A FACE TO FACE EVALUATION WAS PERFORMED  Ethan Fitzgerald 04/06/2020, 2:02 PM

## 2020-04-07 ENCOUNTER — Telehealth: Payer: Self-pay

## 2020-04-07 ENCOUNTER — Inpatient Hospital Stay (HOSPITAL_COMMUNITY): Payer: Medicare HMO | Admitting: Occupational Therapy

## 2020-04-07 ENCOUNTER — Encounter (HOSPITAL_COMMUNITY): Payer: Medicare HMO | Admitting: Speech Pathology

## 2020-04-07 ENCOUNTER — Inpatient Hospital Stay (HOSPITAL_COMMUNITY): Payer: Medicare HMO | Admitting: Physical Therapy

## 2020-04-07 ENCOUNTER — Inpatient Hospital Stay (HOSPITAL_COMMUNITY): Payer: Medicare HMO

## 2020-04-07 DIAGNOSIS — I63511 Cerebral infarction due to unspecified occlusion or stenosis of right middle cerebral artery: Secondary | ICD-10-CM | POA: Diagnosis not present

## 2020-04-07 LAB — BASIC METABOLIC PANEL
Anion gap: 9 (ref 5–15)
BUN: 19 mg/dL (ref 8–23)
CO2: 24 mmol/L (ref 22–32)
Calcium: 9 mg/dL (ref 8.9–10.3)
Chloride: 108 mmol/L (ref 98–111)
Creatinine, Ser: 1.1 mg/dL (ref 0.61–1.24)
GFR calc Af Amer: >60 mL/min (ref >60)
GFR calc non Af Amer: >60 mL/min (ref >60)
Glucose, Bld: 98 mg/dL (ref 70–99)
Potassium: 3.7 mmol/L (ref 3.5–5.1)
Sodium: 141 mmol/L (ref 135–145)

## 2020-04-07 LAB — CBC
HCT: 40 % (ref 39.0–52.0)
Hemoglobin: 13.7 g/dL (ref 13.0–17.0)
MCH: 32.9 pg (ref 26.0–34.0)
MCHC: 34.3 g/dL (ref 30.0–36.0)
MCV: 95.9 fL (ref 80.0–100.0)
Platelets: 311 10*3/uL (ref 150–400)
RBC: 4.17 MIL/uL — ABNORMAL LOW (ref 4.22–5.81)
RDW: 12.9 % (ref 11.5–15.5)
WBC: 12.3 10*3/uL — ABNORMAL HIGH (ref 4.0–10.5)
nRBC: 0 % (ref 0.0–0.2)

## 2020-04-07 MED ORDER — ASPIRIN EC 325 MG PO TBEC
325.0000 mg | DELAYED_RELEASE_TABLET | Freq: Every day | ORAL | Status: DC
  Administered 2020-04-08 – 2020-04-09 (×2): 325 mg via ORAL
  Filled 2020-04-07 (×2): qty 1

## 2020-04-07 NOTE — Progress Notes (Signed)
Modified Barium Swallow Progress Note  Patient Details  Name: Ethan Fitzgerald MRN: [id number] Date of Birth: 1955-10-15  Today's Date: 04/07/2020  Modified Barium Swallow completed.  Full report located under Chart Review in the Imaging Section.  Brief recommendations include the following:  Clinical Impression   Pt demonstrated improvements in oropharyngeal swallow function resulting in better ability to protect his airway today in comparison to last MBSS (which was conducted 03/30/20). Swallow initiation is becoming more timely, occurring more consistently at the level of the vallecula and only intermittently at the pyriform sinuses, with no pooling noted. Early in study, thin barium consumed via cup penetrated into laryngeal vestibule X1 but did not contact the vocal folds and 90% of barium appeared to eject from the vestibule with an extra dry swallow, which was sensed and initiated by pt without need for cueing. This instance of penetration would classify as 2 on PAS scale. No additional penetration or aspiration was observed during today's study. Although pt continued to have mild vallecular and pyriform sinus residue of thin, he consistently uses extra dry swallows which is effective to clear without airway intrusion. SLP reinforced that pt should continue to utilize extra dry swallow to minimize aspiration risk and pt in agreement. Due to oral phase impairments s/p reduced left sided facial sensation and weakness, pt unable to propel barium pill posteriorly as cohesive bolus with thin and was ultimately expectorated. Would recommend pt continue Dysphagia 3 solid textures and pills whole in applesauce one at a time, upgrade to thin liquids via cup or straw but must take 1 small sip at a time and perform extra dry swallow after each sip. Pt should continue to follow solids with liquids and use slow rate during PO intake. ST will continue to provide skilled interventions and education for dysphagia  while inpatient to ensure diet safety and efficiency prior to discharge.   Swallow Evaluation Recommendations       SLP Diet Recommendations: Dysphagia 3 (Mech soft) solids;Thin liquid   Liquid Administration via: Cup;Straw   Medication Administration: Whole meds with puree   Supervision: Intermittent supervision to cue for compensatory strategies;Patient able to self feed   Compensations: Slow rate;Small sips/bites;Follow solids with liquid;Multiple dry swallows after each bite/sip;Lingual sweep for clearance of pocketing   Postural Changes: Seated upright at 90 degrees   Oral Care Recommendations: Oral care BID        Arbutus Leas 04/07/2020,10:09 AM

## 2020-04-07 NOTE — Progress Notes (Signed)
Pt has refused all 3 doses of voltaren gel to L shoulder. hasnt c/o pain to L shoulder or anywhere else.   Erie Noe, RN

## 2020-04-07 NOTE — Progress Notes (Addendum)
[name], male   DOB: 1956-06-24, 64 y.o.   MRN: [id number]  This SW covering for assigned SW, Erlene Quan.   Per therapy, they would like family education.   SW spoke with pt wife [name] [phone]) to discuss family edu tomorrow. She will be here for his 10am and 1pm session. SW informed no DME recommended. Outpatient PT/OT/SLP at [facility] (p:[phone]/[phone]).  Sw faxed outpatient referral [phone] .  Loralee Pacas, MSW, [place] Office: [phone] Cell: [phone] Fax: [phone]

## 2020-04-07 NOTE — Progress Notes (Signed)
Physical Therapy Session Note  Patient Details  Name: Ethan Fitzgerald MRN: [id number] Date of Birth: May 16, 1956  Today's Date: 04/07/2020 PT Individual Time: 3735-7897 PT Individual Time Calculation (min): 44 min   Short Term Goals: Week 1:  PT Short Term Goal 1 (Week 1): = to LTGs based on ELOS  Skilled Therapeutic Interventions/Progress Updates:    Pt received asleep, supine in bed but easily awakens and agreeable to therapy session. Pt does report some fatigue this afternoon. Supine>sitting R EOB using bed features mod-I. Sitting EOB donned shoes without assist - continues to require increased time for L UE coordination. Sit<>stands with supervision for safety. Gait ~67ft x2 to/from bathroom, no AD, with supervision - no LOB. Standing with distant supervision for safety completed LB clothing management and voided bladder. Standing hand hygiene at sink with supervision for safety. Gait ~211ft to main therapy gym, no AD, with supervision - no LOB occurred. Gait training with L attention and dual-cognitive task of locating sticky notes on the wall in order sequence of "A, 1, B, 2...E, 5" - pt noted to have most difficulty visually scanning to locate item on walls with busy backgrounds (I.e. bulletin boards) - requires significantly increased time to complete this task with pt requiring additional time in the beginning to recall correct sequence. Sitting dual-cognitive task of placing sticky notes in the reverse order in which he found them on the wall (I.e. "5, E, 4, D...1, A") - this was incredibly difficult for patient with him ultimately requiring the sequence written in ascending order in order for him to sequentially follow backwards to correctly reverse it - pt demos some impaired awareness of deficits reporting this task was not challenging for him. Standing balance and dual cognitive task of completing 2 mild complexity ping pong ball patterns - pt demos increased ease of performing this compared  to the sequencing task. Pt talked about wanting to exercise on his treadmill at home - educated that he needed to perform this during [facility] in order for them to monitor his vitals and balance prior to him attempting it at home. Gait training ~216ft back to room, no AD, with supervision. Sit>supine using bed features mod-I. Pt left supine in bed with needs in reach and bed alarm on.  Therapy Documentation Precautions:  Precautions Precautions: Fall, Other (comment) Precaution Comments: impulsive; Lt inattention  Restrictions Weight Bearing Restrictions: No  Pain:   No reports of pain throughout session.   Therapy/Group: Individual Therapy  Tawana Scale , PT, DPT, CSRS  04/07/2020, 12:21 PM

## 2020-04-07 NOTE — Progress Notes (Signed)
Occupational Therapy Session Note  Patient Details  Name: Ethan Fitzgerald MRN: [id number] Date of Birth: 05-Apr-1956  Today's Date: 04/07/2020 OT Individual Time: 0300-9233 OT Individual Time Calculation (min): 43 min    Short Term Goals: Week 1:  OT Short Term Goal 1 (Week 1): STG=LTGs secondary to short estimated LOS  Skilled Therapeutic Interventions/Progress Updates:    Session 1: (0076-2263)  Ethan Fitzgerald transferred to the EOB with supervision to begin session, where he was able to donn his shoes with increased time.  Slight difficulty noted with tying them ,but he was able to eventually complete with setup assist only.  Next, he ambulated down to the ortho gym at supervision level without a device, where he worked on LUE coordination with use of the [employer].  He was able to stand and complete several one minute intervals using isolated use of the LUE.  Initial average time was at 2.2 seconds.  He then completed 27/33 lights with a 2.5 second time limit.  The next interval was with the same parameters at 16/29 lights with greater misses noted in the left superior and inferior fields.  For the final interval the time was increased to 2.75 with Ethan Fitzgerald completing 28/32 targets.  Next, had him work on LUE coordination with ball toss and catch from seated position.  He was able to catch and toss a medium sized ball with 95% accuracy but did exhibit slow reactions with the LUE and occasional drops.  Progressed to standing and tossing the ball to himself while ambulating in the gym at 80-90 % accuracy for catching it.  Finished session with return to the room and pt laying back down in the bed at supervision level after removal of his shoes.  Call button and phone in reach with safety alarm in place.    Session 2: ([id number])  Pt donned his shoes EOB with supervision and then ambulated down to the therapy gym at supervision level with not device.  He then sat on a mat and worked on [employer] coordination  with use of the [employer].  He demonstrated increased difficulty manipulating pegs one at a time and placing them in the holes with approximately 75% accuracy and with increased time.  Returned to the room at the end of the session with the call button and phone in reach and pt resting in the bed.  Alarm also in place.    Therapy Documentation Precautions:  Precautions Precautions: Fall, Other (comment) Precaution Comments: impulsive; Lt inattention  Restrictions Weight Bearing Restrictions: No  Pain: Pain Assessment Pain Scale: Faces Pain Score: 0-No pain ADL:  See Care Tool Section for some details of mobility and selfcare  Therapy/Group: Individual Therapy  Shalev Helminiak OTR/L 04/07/2020, 3:42 PM

## 2020-04-07 NOTE — Progress Notes (Signed)
Occupational Therapy Session Note  Patient Details  Name: HEYWOOD TOKUNAGA MRN: [id number] Date of Birth: 02/02/1956  Today's Date: 04/07/2020 OT Individual Time: 8118-8677 OT Individual Time Calculation (min): 27 min    Short Term Goals: Week 1:  OT Short Term Goal 1 (Week 1): STG=LTGs secondary to short estimated LOS  Skilled Therapeutic Interventions/Progress Updates:  Patient met lying supine in bed in agreement with OT treatment session with focus on self-care re-education and LUE NMR as detailed below. Supine to EOB with supervision A progressing to independence and sit to stand with supervision A. Patient ambulated to sink without AE and  CGA progressing to supervision A in prep for standing grooming tasks. Patient with some difficulty managing tubes/containers 2/2 decreased [place] but able to complete task with supervision A overall. Bed making task with focus on LUE NMR and dynamic standing balance. Session concluded with patient seated in wc with handoff to staff for barium swallow test.   Therapy Documentation Precautions:  Precautions Precautions: Fall, Other (comment) Precaution Comments: impulsive; Lt inattention  Restrictions Weight Bearing Restrictions: No General:     Therapy/Group: Individual Therapy  Adelina Collard R Howerton-Davis 04/07/2020, 8:48 AM

## 2020-04-07 NOTE — Progress Notes (Signed)
[facility] PHYSICAL MEDICINE & REHABILITATION PROGRESS NOTE   Subjective/Complaints: Upgraded to thin liquid discusssed need to take small sips but drink frequently   Review of systems  Pt denies SOB, abd pain, CP, N/V/C/D, and vision changes   Objective:   DG Swallowing Func-Speech Pathology  Result Date: 04/07/2020 Objective Swallowing Evaluation: Type of Study: MBS-Modified Barium Swallow Study  Patient Details Name: Ethan Fitzgerald MRN: [id number] Date of Birth: 1955-10-13 Today's Date: 04/07/2020 Past Medical History: Past Medical History: Diagnosis Date . COPD (chronic obstructive pulmonary disease) (HCC)   mild . COVID-19 09/2019 . Erectile dysfunction  . Headache   daily, stress headaches . Knee pain, left  . Seasonal allergies  . Tobacco dependence  Past Surgical History: Past Surgical History: Procedure Laterality Date . BACK SURGERY    metal plate in back . COLONOSCOPY   . COLONOSCOPY WITH PROPOFOL N/A 04/26/2019  Procedure: COLONOSCOPY WITH PROPOFOL;  Surgeon: Jonathon Bellows, MD;  Location: [facility] ENDOSCOPY;  Service: Gastroenterology;  Laterality: N/A; . HERNIA REPAIR Right  . IR ANGIO INTRA EXTRACRAN SEL COM CAROTID INNOMINATE UNI L MOD SED  03/27/2020 . IR ANGIO VERTEBRAL SEL SUBCLAVIAN INNOMINATE UNI L MOD SED  03/27/2020 . IR ANGIO VERTEBRAL SEL VERTEBRAL UNI R MOD SED  03/27/2020 . IR CT HEAD LTD  03/27/2020 . IR PERCUTANEOUS ART THROMBECTOMY/INFUSION INTRACRANIAL INC DIAG ANGIO  03/27/2020 . KNEE ARTHROSCOPY Left 03/28/2015  Procedure: Arthroscopic partial medial meniscectomy plus chondral debridement;  Surgeon: Leanor Kail, MD;  Location: [facility];  Service: Orthopedics;  Laterality: Left; . RADIOLOGY WITH ANESTHESIA N/A 03/27/2020  Procedure: IR WITH ANESTHESIA;  Surgeon: Luanne Bras, MD;  Location: [facility];  Service: Radiology;  Laterality: N/A; HPI: 64 y.o. male with history of COPD presenting with L sided weakness.  R MCA infarct s/p tPA and IR with partial revascularization.  CT head 7/23: moderate-sized acute right MCA infarct centered in right frontal operculum; Hyperdensity within regional sulci, right sylvian fissure, and interpeduncular cistern may reflect a combination of subarachnoid hemorrhage and contrast.   Subjective: Pt was alert and cooperative Assessment / Plan / Recommendation CHL IP CLINICAL IMPRESSIONS 04/07/2020 Clinical Impression --Pt demonstrated improvements in oropharyngeal swallow function resulting in better ability to protect his airway today in comparison to last MBSS (which was conducted 03/30/20). Swallow initiation is becoming more timely, occurring more consistently at the level of the vallecula and only intermittently at the pyriform sinuses, with no pooling noted. Early in study, thin barium consumed via cup penetrated into laryngeal vestibule X1 but did not contact the vocal folds and 90% of barium appeared to eject from the vestibule with an extra dry swallow, which was sensed and initiated by pt without need for cueing. This instance of penetration would classify as 2 on PAS scale. No additional penetration or aspiration was observed during today's study. Although pt continued to have mild vallecular and pyriform sinus residue of thin, he consistently uses extra dry swallows which is effective to clear without airway intrusion. SLP reinforced that pt should continue to utilize extra dry swallow to minimize aspiration risk and pt in agreement. Due to oral phase impairments s/p reduced left sided facial sensation and weakness, pt unable to propel barium pill posteriorly as cohesive bolus with thin and was ultimately expectorated. Would recommend pt continue Dysphagia 3 solid textures and pills whole in applesauce one at a time, upgrade to thin liquids via cup or straw but must take 1 small sip at a time and perform extra dry  swallow after each sip. Pt should continue to follow solids with liquids and use slow rate during PO intake. ST will continue to  provide skilled interventions and education for dysphagia while inpatient to ensure diet safety and efficiency prior to discharge. SLP Visit Diagnosis Dysphagia, oropharyngeal phase (R13.12) Attention and concentration deficit following -- Frontal lobe and executive function deficit following -- Impact on safety and function Mild aspiration risk   CHL IP TREATMENT RECOMMENDATION 03/30/2020 Treatment Recommendations Therapy as outlined in treatment plan below   Prognosis 03/30/2020 Prognosis for Safe Diet Advancement Good Barriers to Reach Goals -- Barriers/Prognosis Comment -- CHL IP DIET RECOMMENDATION 04/07/2020 SLP Diet Recommendations Dysphagia 3 (Mech soft) solids;Thin liquid Liquid Administration via Cup;Straw Medication Administration Whole meds with puree Compensations Slow rate;Small sips/bites;Follow solids with liquid;Multiple dry swallows after each bite/sip;Lingual sweep for clearance of pocketing Postural Changes Seated upright at 90 degrees   CHL IP OTHER RECOMMENDATIONS 04/07/2020 Recommended Consults -- Oral Care Recommendations Oral care BID Other Recommendations --   CHL IP FOLLOW UP RECOMMENDATIONS 04/01/2020 Follow up Recommendations Inpatient Rehab   CHL IP FREQUENCY AND DURATION 03/30/2020 Speech Therapy Frequency (ACUTE ONLY) min 2x/week Treatment Duration 2 weeks      CHL IP ORAL PHASE 04/07/2020 Oral Phase Impaired Oral - Pudding Teaspoon -- Oral - Pudding Cup -- Oral - Honey Teaspoon -- Oral - Honey Cup NT Oral - Nectar Teaspoon -- Oral - Nectar Cup NT Oral - Nectar Straw NT Oral - Thin Teaspoon NT Oral - Thin Cup Lingual/palatal residue;Decreased bolus cohesion;Premature spillage Oral - Thin Straw Lingual/palatal residue;Premature spillage;Decreased bolus cohesion Oral - Puree NT Oral - Mech Soft -- Oral - Regular NT Oral - Multi-Consistency -- Oral - Pill -- Oral Phase - Comment --  CHL IP PHARYNGEAL PHASE 04/07/2020 Pharyngeal Phase Impaired Pharyngeal- Pudding Teaspoon -- Pharyngeal -- Pharyngeal-  Pudding Cup -- Pharyngeal -- Pharyngeal- Honey Teaspoon -- Pharyngeal -- Pharyngeal- Honey Cup NT Pharyngeal -- Pharyngeal- Nectar Teaspoon -- Pharyngeal -- Pharyngeal- Nectar Cup NT Pharyngeal -- Pharyngeal- Nectar Straw NT Pharyngeal -- Pharyngeal- Thin Teaspoon NT Pharyngeal -- Pharyngeal- Thin Cup Delayed swallow initiation-vallecula;Delayed swallow initiation-pyriform sinuses;Reduced airway/laryngeal closure;Penetration/Aspiration before swallow;Pharyngeal residue - valleculae;Pharyngeal residue - pyriform;Compensatory strategies attempted (with notebox);Reduced anterior laryngeal mobility;Reduced laryngeal elevation;Reduced epiglottic inversion Pharyngeal Material enters airway, remains ABOVE vocal cords then ejected out Pharyngeal- Thin Straw Delayed swallow initiation-vallecula;Delayed swallow initiation-pyriform sinuses;Reduced anterior laryngeal mobility;Reduced laryngeal elevation;Reduced epiglottic inversion;Pharyngeal residue - valleculae;Pharyngeal residue - pyriform Pharyngeal Material does not enter airway Pharyngeal- Puree NT Pharyngeal -- Pharyngeal- Mechanical Soft -- Pharyngeal -- Pharyngeal- Regular NT Pharyngeal -- Pharyngeal- Multi-consistency -- Pharyngeal -- Pharyngeal- Pill -- Pharyngeal -- Pharyngeal Comment --  CHL IP CERVICAL ESOPHAGEAL PHASE 04/07/2020 Cervical Esophageal Phase WFL Pudding Teaspoon -- Pudding Cup -- Honey Teaspoon -- Honey Cup -- Nectar Teaspoon -- Nectar Cup -- Nectar Straw -- Thin Teaspoon -- Thin Cup -- Thin Straw -- Puree -- Mechanical Soft -- Regular -- Multi-consistency -- Pill -- Cervical Esophageal Comment -- Ethan Fitzgerald 04/07/2020, 10:16 AM              Recent Labs    04/07/20 0505  WBC 12.3*  HGB 13.7  HCT 40.0  PLT 311   Recent Labs    04/07/20 0505  NA 141  K 3.7  CL 108  CO2 24  GLUCOSE 98  BUN 19  CREATININE 1.10  CALCIUM 9.0    Intake/Output Summary (Last 24 hours) at 04/07/2020 1050 Last data filed at 04/06/2020  2300 Gross per 24 hour   Intake 480 ml  Output --  Net 480 ml     Physical Exam: Vital Signs Blood pressure (!) 118/87, pulse 76, temperature 97.6 F (36.4 C), resp. rate 16, height 6' (1.829 m), weight 74.3 kg, SpO2 97 %.  General: No acute distress Mood and affect are appropriate Heart: Regular rate and rhythm no rubs murmurs or extra sounds Lungs: Clear to auscultation, breathing unlabored, no rales or wheezes Abdomen: Positive bowel sounds, soft nontender to palpation, nondistended Extremities: No clubbing, cyanosis, or edema Skin: No evidence of breakdown, no evidence of rash   Neurologic: Cranial nerves II through XII intact, motor strength is 5/5 in right and 4/5 left deltoid, bicep, tricep, grip, hip flexor, knee extensors, ankle dorsiflexor and plantar flexor Sensory exam normal sensation to light touch in bilateral upper and lower extremities  Musculoskeletal: Pain with abduction left shoulder- TTP over posterior shoulder/RTC- slightly less TTP  Assessment/Plan: 1. Functional deficits secondary to Right MCA infarct which require 3+ hours per day of interdisciplinary therapy in a comprehensive inpatient rehab setting.  Physiatrist is providing close team supervision and 24 hour management of active medical problems listed below.  Physiatrist and rehab team continue to assess barriers to discharge/monitor patient progress toward functional and medical goals  Care Tool:  Bathing  Bathing activity did not occur: Refused Body parts bathed by patient: Right arm, Left arm, Chest, Abdomen, Front perineal area, Buttocks, Right upper leg, Left upper leg, Right lower leg, Face, Left lower leg         Bathing assist Assist Level: Contact Guard/Touching assist     Upper Body Dressing/Undressing Upper body dressing   What is the patient wearing?: Pull over shirt    Upper body assist Assist Level: Supervision/Verbal cueing    Lower Body Dressing/Undressing Lower body dressing      What is  the patient wearing?: Underwear/pull up, Pants     Lower body assist Assist for lower body dressing: Contact Guard/Touching assist     Toileting Toileting    Toileting assist Assist for toileting: Supervision/Verbal cueing     Transfers Chair/bed transfer  Transfers assist     Chair/bed transfer assist level: Contact Guard/Touching assist     Locomotion Ambulation   Ambulation assist      Assist level: Contact Guard/Touching assist Assistive device: No Device Max distance: 240ft   Walk 10 feet activity   Assist     Assist level: Contact Guard/Touching assist Assistive device: No Device   Walk 50 feet activity   Assist    Assist level: Contact Guard/Touching assist Assistive device: No Device    Walk 150 feet activity   Assist    Assist level: Contact Guard/Touching assist Assistive device: No Device    Walk 10 feet on uneven surface  activity   Assist     Assist level: Minimal Assistance - Patient > 75% Assistive device: Other (comment) (handrail)   Wheelchair     Assist Will patient use wheelchair at discharge?: No             Wheelchair 50 feet with 2 turns activity    Assist            Wheelchair 150 feet activity     Assist          Blood pressure (!) 118/87, pulse 76, temperature 97.6 F (36.4 C), resp. rate 16, height 6' (1.829 m), weight 74.3 kg, SpO2 97 %.    Medical  Problem List and Plan: 1.Impaired function and L hemiparesis/dysphagiasecondary to R MCA CVA s/p tPA -patient may shower -ELOS/Goals: 2-2.5 weeks; mod I to supervision 2. Antithrombotics: -DVT/anticoagulation:Pharmaceutical:Lovenox -antiplatelet therapy: ASA and plavix  X 3 months.   3.Headaches/Pain Management:Tylenol prn. 4. Mood:LCSW to follow for evaluation and support. -antipsychotic agents: N/A 5. Neuropsych: This patientIs not fullycapable of making  decisions onhisown behalf. 6. Skin/Wound Care:Routine pressure relief measures. 7. Fluids/Electrolytes/Nutrition:Monitor I/O. Check lytes in am as on modified diet. May d/c CBG 8. VWA:QLRJ recent hypotension intermittently-Long term BP goal 130-150 due to carotid stenosis.Monitor BP tid --continue  8/1- BP 145/92- slightly elevated but still in goal- so won't change meds  9. Centrilobar Emphysema: On maintenance inhaler (Stiolto respimat) at home-->resume  10. Hyperlipidemia:On Crestor 11. Parotid mass: Follow up with ENT after discharge.  12. Right shoulder pain: Question RTC strain--will add voltaren gel with ice for local measures.  7/31- says voltaren gel very helpful- will con't  13. Dysphagia: On Dysphagia 3, honey liquids --encourage fluid intake. Single ice chips after oral care. Upgraded to thins which should help with oral intake, will d/c IVF Monitor intake 14. Constipation:  Last bowel movement 7/28  7/31- says not eating well/poor appetite- if no BM by tomorrow, will intervene.   8/1- LBM actually 7/29-    LOS: 6 days A FACE TO FACE EVALUATION WAS PERFORMED  Charlett Blake 04/07/2020, 10:50 AM

## 2020-04-08 ENCOUNTER — Inpatient Hospital Stay (HOSPITAL_COMMUNITY): Payer: Medicare HMO | Admitting: Occupational Therapy

## 2020-04-08 ENCOUNTER — Inpatient Hospital Stay (HOSPITAL_COMMUNITY): Payer: Medicare HMO | Admitting: Speech Pathology

## 2020-04-08 ENCOUNTER — Inpatient Hospital Stay (HOSPITAL_COMMUNITY): Payer: Medicare HMO | Admitting: Physical Therapy

## 2020-04-08 NOTE — Progress Notes (Signed)
Occupational Therapy Session Note  Patient Details  Name: Ethan Fitzgerald MRN: [id number] Date of Birth: 09-Jan-1956  Today's Date: 04/08/2020 OT Individual Time: 0388-8280 OT Individual Time Calculation (min): 75 min    Short Term Goals: Week 1:  OT Short Term Goal 1 (Week 1): STG=LTGs secondary to short estimated LOS  Skilled Therapeutic Interventions/Progress Updates:    Pt completed shower and dressing sit to stand with overall modified independence during session.  He was able to sequence through gathering his clothing as well as completing the shower in standing with modified independence and use of the grab bar for occasional support.  Pt has a small shower seat that can be used at home as well.  He was able to stand and dry off as well as stand and donning his underpants and pants without support.  He completed grooming tasks in standing at independent level as well.  Next, he ambulated down to the therapy gym at supervision level without use of an assistive device.  He then completed nine hole peg coordination test with the RUE in 29 seconds and an average of 49 seconds in the LUE.  Educated him on theraband exercises for left shoulder flexion with mod instructional cueing and issued handout for further review in next session.  Had him work on [employer] with use of the [employer], as well as ball toss and catch in sitting.  He was able to catch and medium and small sized ball with BUEs consistently, but could not catch the smaller ball with the left hand consistently.  Finished session with ambulation back to the room as supervision level with pt left sitting EOB and call button and phone in reach.  Safety alarm also in place for safety.    Therapy Documentation Precautions:  Precautions Precautions: Fall, Other (comment) Precaution Comments: left inattention Restrictions Weight Bearing Restrictions: No  Pain: Pain Assessment Pain Scale: Faces Pain Score: 0-No pain ADL: See  Care Tool Section for some details of mobility and selfcare  Therapy/Group: Individual Therapy  Martise Waddell  OTR/L 04/08/2020, 12:33 PM

## 2020-04-08 NOTE — Progress Notes (Signed)
Speech Language Pathology Discharge Summary  Patient Details  Name: Ethan Fitzgerald MRN: [id number] Date of Birth: [birth year]  Today's Date: 04/08/2020 SLP Individual Time: 0731-0826 SLP Individual Time Calculation (min): 55 min   Skilled Therapeutic Interventions:  Pt was seen for skilled ST targeting dysphagia, speech, and education with pt. Pt politely declined solid PO intake today, however consumed thin liquid via cup with 1 immediate cough response after his first sip. No additional overt s/sx aspiration were noted during rest of intake and he verbally recalled and implemented recommended swallow strategies with Supervision A verbal cues. Speech goals targeted in functional conversation, during which pt utilized slow rate and overarticulation to achieve 95% intelligibility with Supervision A verbal cues. Although no family present, SLP spend time completing education regarding speech and dysphagia with pt. He had several insightful questions regarding food items in and out of compliance with current diet recommendations (dysphagia 3 solids, thin liquids). Questions answered to his satisfaction and paper handout also provided for future reference. Handout and verbal review of intelligibility strategies also addressed and discussed potential impact of fatigue and telephone on intelligibility. Although pt's cognition and judgement are [facility], SLP reinforced recommendation for 24/7 supervision for greatest safety at home. Pt verbally acknowledged all recommendations. Pt left sitting edge of bed with alarm set and needs within reach. Continue per current plan of care.  Patient has met 4 of 4 long term goals.  Patient to discharge at overall Supervision level.  Reasons goals not met: n/a   Clinical Impression/Discharge Summary:   Pt made functional gains and met 4 out of 4 long term goals this admission. Pt currently requires Supervision assist for use of speech intelligibility and compensatory swallow  strategies due to dysarthria and dysphagia. Pt is consuming an upgraded dysphagia 3 (mechanical soft) diet with thin liquids. He must perform an extra dry swallow with liquids, follow solids with liquids, use slow rate and take one sip at a time. He also demonstrated left buccal pocketing but is using a mirror, digital sweep, and liquid washed to clear pocketed POs. Most efficient method of medication administration is whole in purees. Although dysarthria persists due to reduced left facial sensation and weakness, pt is 90% intelligible at the sentence level. Given swallow and speech deficits still present, recommend pt continue to receive skilled OP ST services upon discharge. Pt education is complete at this time; no family was present for ST sessions, however pt's cognition is [facility] and he has demonstrated knowledge and carryover of all education in ST sessions.   Care Partner:  Caregiver Able to Provide Assistance: Yes  Type of Caregiver Assistance: Cognitive  Recommendation:  Home Health SLP;24 hour supervision/assistance;Outpatient SLP  Rationale for SLP Follow Up: Maximize swallowing safety;Maximize functional communication   Equipment: none   Reasons for discharge: Discharged from hospital   Patient/Family Agrees with Progress Made and Goals Achieved: Yes    Arbutus Leas 04/08/2020, 11:31 AM

## 2020-04-08 NOTE — Progress Notes (Signed)
[name], male   DOB: 07-29-1956, 64 y.o.   MRN: [id number] Rehab team recommended OP follow up services. Patient reported preference for [facility]. Referral for OP PT, OT and SLP submitted and approved 04/07/20 with an initial OT visit/assessment scheduled for Tuesday 04/15/20  @  0915. Patient given information about follow up schedule and written appointment with directions/contact information for the [facility] facility. Stated an understanding of the information provided.

## 2020-04-08 NOTE — Plan of Care (Signed)
  Problem: Consults Goal: RH STROKE PATIENT EDUCATION Description: See Patient Education module for education specifics  Outcome: Progressing Goal: Nutrition Consult-if indicated Outcome: Progressing   Problem: RH BOWEL ELIMINATION Goal: RH STG MANAGE BOWEL WITH ASSISTANCE Description: STG Manage Bowel with min Assistance. Outcome: Progressing   Problem: RH BLADDER ELIMINATION Goal: RH STG MANAGE BLADDER WITH ASSISTANCE Description: STG Manage Bladder With min Assistance Outcome: Progressing   Problem: RH SKIN INTEGRITY Goal: RH STG SKIN FREE OF INFECTION/BREAKDOWN Description: Pt will be free of skin breakdown with min assist  Outcome: Progressing Goal: RH STG MAINTAIN SKIN INTEGRITY WITH ASSISTANCE Description: STG Maintain Skin Integrity With min Assistance. Outcome: Progressing   Problem: RH SAFETY Goal: RH STG ADHERE TO SAFETY PRECAUTIONS W/ASSISTANCE/DEVICE Description: STG Adhere to Safety Precautions With cues/reminders  Outcome: Progressing   Problem: RH PAIN MANAGEMENT Goal: RH STG PAIN MANAGED AT OR BELOW PT'S PAIN GOAL Description: Pian scale <2/10 Outcome: Progressing   Problem: RH KNOWLEDGE DEFICIT Goal: RH STG INCREASE KNOWLEDGE OF DYSPHAGIA/FLUID INTAKE Description: Patient will be able to verbalize reasons for dysphagia diet Outcome: Progressing Goal: RH STG INCREASE KNOWLEGDE OF HYPERLIPIDEMIA Description: Patient will be able to list risks of hyperlipidemia Outcome: Progressing Goal: RH STG INCREASE KNOWLEDGE OF STROKE PROPHYLAXIS Description: Patient will be able to name medications given for stroke prophylaxis Outcome: Progressing

## 2020-04-08 NOTE — Progress Notes (Signed)
Occupational Therapy Discharge Summary  Patient Details  Name: Ethan Fitzgerald MRN: [id number] Date of Birth: [birth year]  Today's Date: 04/08/2020 OT Individual Time: 0240-9735 OT Individual Time Calculation (min): 30 min    Session Note:  Educated pt and spouse on FM coordination as well as therapy band exercises to be completed daily at home.  Provided handout for reference with pt return demonstrating performance of the exercises.  Pt left in the bed with spouse in the room.  He was made modified independent in the room at the end of the session.    Patient has met 13 of 13 long term goals due to improved balance, ability to compensate for deficits, functional use of  LEFT upper extremity, improved awareness and improved coordination.  Patient to discharge at overall Modified Independent level.  Patient's care partner is independent to provide the necessary physical assistance at discharge.    Reasons goals not met: NA  Recommendation:  Patient will benefit from ongoing skilled OT services in outpatient setting to continue to advance functional skills in the area of BADL, iADL and Vocation.  Pt continues to demonstrate some mild left inattention with possible field deficit, but this will need to be tested further at optometry office.  He also still exhibits decreased LUE coordination and strength which impact his ADL and IADL function as well as possible return to work.  Recommend continued outpatient OT to address these deficits.    Equipment: No equipment provided  Reasons for discharge: treatment goals met and discharge from hospital  Patient/family agrees with progress made and goals achieved: Yes  OT Discharge Precautions/Restrictions  Precautions Precautions: Fall;Other (comment) Precaution Comments: left inattention   Pain Pain Assessment Pain Scale: Faces Pain Score: 0-No pain ADL ADL Eating: Independent Where Assessed-Eating: Edge of bed Grooming: Independent Where  Assessed-Grooming: Standing at sink Upper Body Bathing: Modified independent Where Assessed-Upper Body Bathing: Shower Lower Body Bathing: Modified independent Where Assessed-Lower Body Bathing: Shower Upper Body Dressing: Independent Where Assessed-Upper Body Dressing: Edge of bed Lower Body Dressing: Modified independent Where Assessed-Lower Body Dressing: Standing at sink, Sitting at sink Toileting: Independent Where Assessed-Toileting: Bedside Commode Toilet Transfer: Independent Toilet Transfer Method: Counselling psychologist: Raised toilet seat Social research officer, government: IT consultant Method: Ambulating Vision Baseline Vision/History: Wears glasses Wears Glasses: Reading only Patient Visual Report: No change from baseline Vision Assessment?: Yes Eye Alignment: Within Functional Limits Ocular Range of Motion: Within Functional Limits Alignment/Gaze Preference: Within Defined Limits Tracking/Visual Pursuits: Able to track stimulus in all quads without difficulty Visual Fields: No apparent deficits (Needs further specific and accurate testing as pt also exhibits some left inattention and my have slight fieldcut not noted with gross testing.) Perception  Perception: Impaired Inattention/Neglect: Does not attend to left visual field Praxis Praxis: Intact Cognition Overall Cognitive Status: Within Functional Limits for tasks assessed Arousal/Alertness: Awake/alert Attention: Sustained;Selective Focused Attention: Appears intact Sustained Attention: Appears intact Selective Attention: Appears intact Memory: Appears intact Awareness: Appears intact Safety/Judgment: Appears intact Sensation Sensation Light Touch: Appears Intact Hot/Cold: Appears Intact Proprioception: Appears Intact Stereognosis: Appears Intact Additional Comments: Sensation intact in the LUE and hand Coordination Gross Motor Movements are Fluid and Coordinated: No Fine  Motor Movements are Fluid and Coordinated: No 9 Hole Peg Test: 29 seconds on the right and 49 seconds on the left Motor  Motor Motor - Discharge Observations: mild LUE hemiparesis Mobility  Bed Mobility Bed Mobility: Supine to Sit;Sit to Supine Supine to Sit: Independent Sit to Supine:  Independent Transfers Sit to Stand: Independent Stand to Sit: Independent  Trunk/Postural Assessment  Cervical Assessment Cervical Assessment: Exceptions to [facility] (slight forward head) Thoracic Assessment Thoracic Assessment: Exceptions to [facility] (slight thoracic rounding) Lumbar Assessment Lumbar Assessment: Within Functional Limits  Balance Balance Balance Assessed: Yes Static Sitting Balance Static Sitting - Balance Support: Feet supported Static Sitting - Level of Assistance: 7: Independent Dynamic Sitting Balance Dynamic Sitting - Balance Support: During functional activity Dynamic Sitting - Level of Assistance: 7: Independent Static Standing Balance Static Standing - Balance Support: During functional activity Static Standing - Level of Assistance: 7: Independent Dynamic Standing Balance Dynamic Standing - Balance Support: During functional activity Dynamic Standing - Level of Assistance: 7: Independent Extremity/Trunk Assessment RUE Assessment RUE Assessment: Within Functional Limits LUE Assessment LUE Assessment: Exceptions to [facility] Passive Range of Motion (PROM) Comments: WFLs Active Range of Motion (AROM) Comments: WFLs General Strength Comments: Pt still with decreased overall strength at 3+/5 in the shoulder and 4/5 at the elbow.  Grip strength 64 lbs compared to 85 on the right.  Decreased FM coordination but able to use functionally at a diminshed level.   Sabeen Piechocki OTR/L 04/08/2020, 4:38 PM

## 2020-04-08 NOTE — Progress Notes (Signed)
Physical Therapy Discharge Summary  Patient Details  Name: Ethan Fitzgerald MRN: [id number] Date of Birth: 02-Feb-1956  Today's Date: 04/08/2020 PT Individual Time: 1305-1403 PT Individual Time Calculation (min): 58 min    Patient has met 11 of 11 long term goals due to improved activity tolerance, improved balance, improved postural control, increased strength, ability to compensate for deficits, functional use of  left upper extremity and left lower extremity, improved attention, improved awareness and improved coordination.  Patient to discharge at an ambulatory level with intermittent Supervision otherwise independent.   Patient's care partner attended family training and is independent to provide the necessary physical and cognitive assistance at discharge.  All goals met.  Recommendation:  Patient will benefit from ongoing skilled PT services in outpatient setting to continue to advance safe functional mobility, address ongoing impairments in dynamic standing balance, higher level gait training, cardiovascular endurance, and minimize fall risk.  Equipment: No equipment provided  - none needed  Reasons for discharge: treatment goals met and discharge from hospital  Patient/family agrees with progress made and goals achieved: Yes  Skilled Therapeutic Interventions/Progress Updates:  Pt received supine in bed having just finished lunch with his wife present and pt agreeable to therapy session. Pt noted to have food pocketed into L cheek- cued pt and reinforced education on importance of cuing patient for this while eating as per precautions written by SLP. Pt's wife present throughout session for education. Supine>sit independently. Sit<>stands and stand pivot transfers, no AD, independently throughout session. Gait ~369f to ortho gym, no AD, independently with no instances of instability. Ambulatory car transfer (SUV height) with distant supervision - no instances of instability. Ambulated  ~110fup/down ramp, ~1091f2 over mulch, and up/down curb step without UE support with distant supervision and no instances of instability. Pt inquires if it is appropriate to pressure wash his porch - educated pt on waiting until further improvement with mobility and UE coordination during follow-up outpatient therapies, then discussing this again with that therapist. Gait ~150f17f main therapy gym, no AD, independently. Participated in the DGI and FGA gait assessments with pt demoing significant improvement in FGA score from 13 to 25 now demonstrating a low fall risk (low fall risk 25-28, medium fall risk 19-24, and high fall risk <19). Pt educated on results of test.  Provided pt with written HEP including the following: - repeated sit<>stands without UE support 2x12 reps - lateral side stepping at counter with level 3 theraband around knees - backwards walking at counter support  - tandem stance Attempted bridging but pt reports this causes low back discomfort from hx of spine surgery - discontinued exercise and excluded it from HEP. Educated pt/family on safe set-up of tandem balance task via standing with back facing corner walls of a room with sturdy chair in front of pt (written on exercises). Pt performed the sit<>stand exercise with cuing for proper form/technique. Pt/family denies any questions/concerns. Ambulated ~200ft28fk to room, no AD, independently. Sit>supine independently. Pt left supine in bed with his wife present and bed alarm on.  PT Discharge Precautions/Restrictions Precautions Precautions: Fall;Other (comment) Precaution Comments: left inattention Restrictions Weight Bearing Restrictions: No Pain Pain Assessment Pain Scale: Faces Pain Score: 0-No pain Perception  Perception Perception: Impaired Inattention/Neglect: Does not attend to left visual field Praxis Praxis: Intact  Cognition Overall Cognitive Status: Within Functional Limits for tasks  assessed Arousal/Alertness: Awake/alert Orientation Level: Oriented X4 Focused Attention: Appears intact Sustained Attention: Appears intact Selective Attention: Appears intact Safety/Judgment:  Appears intact Sensation Sensation Light Touch: Appears Intact Hot/Cold: Not tested Proprioception: Appears Intact Stereognosis: Not tested Coordination Gross Motor Movements are Fluid and Coordinated: Yes Fine Motor Movements are Fluid and Coordinated: No Coordination and Movement Description: impaired L UE fine motor Heel Shin Test: symmetrical Motor  Motor Motor: Other (comment) Motor - Discharge Observations: mild LUE hemiparesis  Mobility Bed Mobility Supine to Sit: Independent Sit to Supine: Independent Transfers Transfers: Sit to Stand;Stand to Sit;Stand Pivot Transfers Sit to Stand: Independent Stand to Sit: Independent Stand Pivot Transfers: Independent Transfer (Assistive device): None Locomotion  Gait Ambulation: Yes Gait Assistance: Independent Gait Distance (Feet): 350 Feet Assistive device: None Gait Gait: Yes Gait Pattern: Within Functional Limits Stairs / Additional Locomotion Stairs: Yes Stairs Assistance: Supervision/Verbal cueing Stair Management Technique: Two rails Number of Stairs: 12 Height of Stairs: 6 Ramp: Supervision/Verbal cueing Curb: Supervision/Verbal cueing Wheelchair Mobility Wheelchair Mobility: No  Trunk/Postural Assessment  Cervical Assessment Cervical Assessment: Exceptions to WFL (slight forward head) Thoracic Assessment Thoracic Assessment: Exceptions to [facility] (slight thoracic rounding) Lumbar Assessment Lumbar Assessment: Within Functional Limits Postural Control Postural Control: Within Functional Limits  Balance Standardized Balance Assessment Standardized Balance Assessment: Functional Gait Assessment Dynamic Gait Index Level Surface: Normal Change in Gait Speed: Normal Gait with Horizontal Head Turns: Normal Gait with  Vertical Head Turns: Mild Impairment Gait and Pivot Turn: Mild Impairment Step Over Obstacle: Normal Step Around Obstacles: Normal Steps: Normal Total Score: 22 Functional Gait  Assessment Gait assessed : Yes Gait Level Surface: Walks 20 ft in less than 5.5 sec, no assistive devices, good speed, no evidence for imbalance, normal gait pattern, deviates no more than 6 in outside of the 12 in walkway width. Change in Gait Speed: Able to smoothly change walking speed without loss of balance or gait deviation. Deviate no more than 6 in outside of the 12 in walkway width. Gait with Horizontal Head Turns: Performs head turns smoothly with no change in gait. Deviates no more than 6 in outside 12 in walkway width Gait with Vertical Head Turns: Performs task with slight change in gait velocity (eg, minor disruption to smooth gait path), deviates 6 - 10 in outside 12 in walkway width or uses assistive device Gait and Pivot Turn: Pivot turns safely in greater than 3 sec and stops with no loss of balance, or pivot turns safely within 3 sec and stops with mild imbalance, requires small steps to catch balance. Step Over Obstacle: Is able to step over 2 stacked shoe boxes taped together (9 in total height) without changing gait speed. No evidence of imbalance. Gait with Narrow Base of Support: Ambulates 7-9 steps. Gait with Eyes Closed: Walks 20 ft, uses assistive device, slower speed, mild gait deviations, deviates 6-10 in outside 12 in walkway width. Ambulates 20 ft in less than 9 sec but greater than 7 sec. Ambulating Backwards: Walks 20 ft, uses assistive device, slower speed, mild gait deviations, deviates 6-10 in outside 12 in walkway width. Steps: Alternating feet, no rail. Total Score: 25 Extremity Assessment      RLE Assessment RLE Assessment: Within Functional Limits Active Range of Motion (AROM) Comments: WFL LLE Assessment LLE Assessment: Within Functional Limits Active Range of Motion (AROM)  Comments: WFL General Strength Comments: WFL with no noticeable difference compared to R LE during supine assessment    Tawana Scale , PT, DPT, CSRS  04/08/2020, 12:38 PM

## 2020-04-08 NOTE — Progress Notes (Signed)
[place] PHYSICAL MEDICINE & REHABILITATION PROGRESS NOTE   Subjective/Complaints:  Discussed fluid intake, d/c.  Also discussed no driving  Review of systems  Pt denies SOB, abd pain, CP, N/V/C/D, and vision changes   Objective:   DG Swallowing Func-Speech Pathology  Result Date: 04/07/2020 Objective Swallowing Evaluation: Type of Study: MBS-Modified Barium Swallow Study  Patient Details Name: Ethan Fitzgerald MRN: [id number] Date of Birth: Oct 26, 1955 Today's Date: 04/07/2020 Past Medical History: Past Medical History: Diagnosis Date . COPD (chronic obstructive pulmonary disease) (HCC)   mild . COVID-19 09/2019 . Erectile dysfunction  . Headache   daily, stress headaches . Knee pain, left  . Seasonal allergies  . Tobacco dependence  Past Surgical History: Past Surgical History: Procedure Laterality Date . BACK SURGERY    metal plate in back . COLONOSCOPY   . COLONOSCOPY WITH PROPOFOL N/A 04/26/2019  Procedure: COLONOSCOPY WITH PROPOFOL;  Surgeon: Jonathon Bellows, MD;  Location: [facility] ENDOSCOPY;  Service: Gastroenterology;  Laterality: N/A; . HERNIA REPAIR Right  . IR ANGIO INTRA EXTRACRAN SEL COM CAROTID INNOMINATE UNI L MOD SED  03/27/2020 . IR ANGIO VERTEBRAL SEL SUBCLAVIAN INNOMINATE UNI L MOD SED  03/27/2020 . IR ANGIO VERTEBRAL SEL VERTEBRAL UNI R MOD SED  03/27/2020 . IR CT HEAD LTD  03/27/2020 . IR PERCUTANEOUS ART THROMBECTOMY/INFUSION INTRACRANIAL INC DIAG ANGIO  03/27/2020 . KNEE ARTHROSCOPY Left 03/28/2015  Procedure: Arthroscopic partial medial meniscectomy plus chondral debridement;  Surgeon: Leanor Kail, MD;  Location: [facility];  Service: Orthopedics;  Laterality: Left; . RADIOLOGY WITH ANESTHESIA N/A 03/27/2020  Procedure: IR WITH ANESTHESIA;  Surgeon: Luanne Bras, MD;  Location: [place];  Service: Radiology;  Laterality: N/A; HPI: 64 y.o. male with history of COPD presenting with L sided weakness.  R MCA infarct s/p tPA and IR with partial revascularization. CT head 7/23:  moderate-sized acute right MCA infarct centered in right frontal operculum; Hyperdensity within regional sulci, right sylvian fissure, and interpeduncular cistern may reflect a combination of subarachnoid hemorrhage and contrast.   Subjective: Pt was alert and cooperative Assessment / Plan / Recommendation CHL IP CLINICAL IMPRESSIONS 04/07/2020 Clinical Impression --Pt demonstrated improvements in oropharyngeal swallow function resulting in better ability to protect his airway today in comparison to last MBSS (which was conducted 03/30/20). Swallow initiation is becoming more timely, occurring more consistently at the level of the vallecula and only intermittently at the pyriform sinuses, with no pooling noted. Early in study, thin barium consumed via cup penetrated into laryngeal vestibule X1 but did not contact the vocal folds and 90% of barium appeared to eject from the vestibule with an extra dry swallow, which was sensed and initiated by pt without need for cueing. This instance of penetration would classify as 2 on PAS scale. No additional penetration or aspiration was observed during today's study. Although pt continued to have mild vallecular and pyriform sinus residue of thin, he consistently uses extra dry swallows which is effective to clear without airway intrusion. SLP reinforced that pt should continue to utilize extra dry swallow to minimize aspiration risk and pt in agreement. Due to oral phase impairments s/p reduced left sided facial sensation and weakness, pt unable to propel barium pill posteriorly as cohesive bolus with thin and was ultimately expectorated. Would recommend pt continue Dysphagia 3 solid textures and pills whole in applesauce one at a time, upgrade to thin liquids via cup or straw but must take 1 small sip at a time and perform extra dry swallow after each sip.  Pt should continue to follow solids with liquids and use slow rate during PO intake. ST will continue to provide skilled  interventions and education for dysphagia while inpatient to ensure diet safety and efficiency prior to discharge. SLP Visit Diagnosis Dysphagia, oropharyngeal phase (R13.12) Attention and concentration deficit following -- Frontal lobe and executive function deficit following -- Impact on safety and function Mild aspiration risk   CHL IP TREATMENT RECOMMENDATION 03/30/2020 Treatment Recommendations Therapy as outlined in treatment plan below   Prognosis 03/30/2020 Prognosis for Safe Diet Advancement Good Barriers to Reach Goals -- Barriers/Prognosis Comment -- CHL IP DIET RECOMMENDATION 04/07/2020 SLP Diet Recommendations Dysphagia 3 (Mech soft) solids;Thin liquid Liquid Administration via Cup;Straw Medication Administration Whole meds with puree Compensations Slow rate;Small sips/bites;Follow solids with liquid;Multiple dry swallows after each bite/sip;Lingual sweep for clearance of pocketing Postural Changes Seated upright at 90 degrees   CHL IP OTHER RECOMMENDATIONS 04/07/2020 Recommended Consults -- Oral Care Recommendations Oral care BID Other Recommendations --   CHL IP FOLLOW UP RECOMMENDATIONS 04/01/2020 Follow up Recommendations Inpatient Rehab   CHL IP FREQUENCY AND DURATION 03/30/2020 Speech Therapy Frequency (ACUTE ONLY) min 2x/week Treatment Duration 2 weeks      CHL IP ORAL PHASE 04/07/2020 Oral Phase Impaired Oral - Pudding Teaspoon -- Oral - Pudding Cup -- Oral - Honey Teaspoon -- Oral - Honey Cup NT Oral - Nectar Teaspoon -- Oral - Nectar Cup NT Oral - Nectar Straw NT Oral - Thin Teaspoon NT Oral - Thin Cup Lingual/palatal residue;Decreased bolus cohesion;Premature spillage Oral - Thin Straw Lingual/palatal residue;Premature spillage;Decreased bolus cohesion Oral - Puree NT Oral - Mech Soft -- Oral - Regular NT Oral - Multi-Consistency -- Oral - Pill -- Oral Phase - Comment --  CHL IP PHARYNGEAL PHASE 04/07/2020 Pharyngeal Phase Impaired Pharyngeal- Pudding Teaspoon -- Pharyngeal -- Pharyngeal- Pudding Cup --  Pharyngeal -- Pharyngeal- Honey Teaspoon -- Pharyngeal -- Pharyngeal- Honey Cup NT Pharyngeal -- Pharyngeal- Nectar Teaspoon -- Pharyngeal -- Pharyngeal- Nectar Cup NT Pharyngeal -- Pharyngeal- Nectar Straw NT Pharyngeal -- Pharyngeal- Thin Teaspoon NT Pharyngeal -- Pharyngeal- Thin Cup Delayed swallow initiation-vallecula;Delayed swallow initiation-pyriform sinuses;Reduced airway/laryngeal closure;Penetration/Aspiration before swallow;Pharyngeal residue - valleculae;Pharyngeal residue - pyriform;Compensatory strategies attempted (with notebox);Reduced anterior laryngeal mobility;Reduced laryngeal elevation;Reduced epiglottic inversion Pharyngeal Material enters airway, remains ABOVE vocal cords then ejected out Pharyngeal- Thin Straw Delayed swallow initiation-vallecula;Delayed swallow initiation-pyriform sinuses;Reduced anterior laryngeal mobility;Reduced laryngeal elevation;Reduced epiglottic inversion;Pharyngeal residue - valleculae;Pharyngeal residue - pyriform Pharyngeal Material does not enter airway Pharyngeal- Puree NT Pharyngeal -- Pharyngeal- Mechanical Soft -- Pharyngeal -- Pharyngeal- Regular NT Pharyngeal -- Pharyngeal- Multi-consistency -- Pharyngeal -- Pharyngeal- Pill -- Pharyngeal -- Pharyngeal Comment --  CHL IP CERVICAL ESOPHAGEAL PHASE 04/07/2020 Cervical Esophageal Phase WFL Pudding Teaspoon -- Pudding Cup -- Honey Teaspoon -- Honey Cup -- Nectar Teaspoon -- Nectar Cup -- Nectar Straw -- Thin Teaspoon -- Thin Cup -- Thin Straw -- Puree -- Mechanical Soft -- Regular -- Multi-consistency -- Pill -- Cervical Esophageal Comment -- Arbutus Leas 04/07/2020, 10:16 AM              Recent Labs    04/07/20 0505  WBC 12.3*  HGB 13.7  HCT 40.0  PLT 311   Recent Labs    04/07/20 0505  NA 141  K 3.7  CL 108  CO2 24  GLUCOSE 98  BUN 19  CREATININE 1.10  CALCIUM 9.0    Intake/Output Summary (Last 24 hours) at 04/08/2020 0847 Last data filed at 04/07/2020 1300 Gross per 24  hour  Intake 120 ml   Output --  Net 120 ml     Physical Exam: Vital Signs Blood pressure 104/88, pulse 83, temperature 98.3 F (36.8 C), resp. rate 16, height 6' (1.829 m), weight 74.3 kg, SpO2 96 %.  General: No acute distress Mood and affect are appropriate Heart: Regular rate and rhythm no rubs murmurs or extra sounds Lungs: Clear to auscultation, breathing unlabored, no rales or wheezes Abdomen: Positive bowel sounds, soft nontender to palpation, nondistended Extremities: No clubbing, cyanosis, or edema Skin: No evidence of breakdown, no evidence of rash   Neurologic: Cranial nerves II through XII intact, motor strength is 5/5 in right and 4/5 left deltoid, bicep, tricep, grip, hip flexor, knee extensors, ankle dorsiflexor and plantar flexor Sensory exam normal sensation to light touch in bilateral upper and lower extremities  Musculoskeletal: Pain with abduction left shoulder- TTP over posterior shoulder/RTC- slightly less TTP  Assessment/Plan: 1. Functional deficits secondary to Right MCA infarct which require 3+ hours per day of interdisciplinary therapy in a comprehensive inpatient rehab setting.  Physiatrist is providing close team supervision and 24 hour management of active medical problems listed below.  Physiatrist and rehab team continue to assess barriers to discharge/monitor patient progress toward functional and medical goals  Care Tool:  Bathing  Bathing activity did not occur: Refused Body parts bathed by patient: Right arm, Left arm, Chest, Abdomen, Front perineal area, Buttocks, Right upper leg, Left upper leg, Right lower leg, Face, Left lower leg         Bathing assist Assist Level: Contact Guard/Touching assist     Upper Body Dressing/Undressing Upper body dressing   What is the patient wearing?: Pull over shirt    Upper body assist Assist Level: Supervision/Verbal cueing    Lower Body Dressing/Undressing Lower body dressing      What is the patient wearing?:  Underwear/pull up, Pants     Lower body assist Assist for lower body dressing: Contact Guard/Touching assist     Toileting Toileting    Toileting assist Assist for toileting: Supervision/Verbal cueing     Transfers Chair/bed transfer  Transfers assist     Chair/bed transfer assist level: Supervision/Verbal cueing     Locomotion Ambulation   Ambulation assist      Assist level: Supervision/Verbal cueing Assistive device: No Device Max distance: 200'   Walk 10 feet activity   Assist     Assist level: Supervision/Verbal cueing Assistive device: No Device   Walk 50 feet activity   Assist    Assist level: Supervision/Verbal cueing Assistive device: No Device    Walk 150 feet activity   Assist    Assist level: Supervision/Verbal cueing Assistive device: No Device    Walk 10 feet on uneven surface  activity   Assist     Assist level: Minimal Assistance - Patient > 75% Assistive device: Other (comment) (handrail)   Wheelchair     Assist Will patient use wheelchair at discharge?: No             Wheelchair 50 feet with 2 turns activity    Assist            Wheelchair 150 feet activity     Assist          Blood pressure 104/88, pulse 83, temperature 98.3 F (36.8 C), resp. rate 16, height 6' (1.829 m), weight 74.3 kg, SpO2 96 %.    Medical Problem List and Plan: 1.Impaired function and L hemiparesis/dysphagiasecondary to R  MCA CVA s/p tPA -patient may shower -ELOS/Goals: 8/4 2. Antithrombotics: -DVT/anticoagulation:Pharmaceutical:Lovenox -antiplatelet therapy: ASA and plavix  X 3 months.   3.Headaches/Pain Management:Tylenol prn. 4. Mood:LCSW to follow for evaluation and support. -antipsychotic agents: N/A 5. Neuropsych: This patientIs not fullycapable of making decisions onhisown behalf. 6. Skin/Wound Care:Routine pressure relief  measures. 7. Fluids/Electrolytes/Nutrition:Monitor I/O. Check lytes in am as on modified diet. May d/c CBG 8. UXL:KGMW recent hypotension intermittently-Long term BP goal 130-150 due to carotid stenosis.Monitor BP tid --continue  8/1- BP 145/92- slightly elevated but still in goal- so won't change meds  9. Centrilobar Emphysema: On maintenance inhaler (Stiolto respimat) at home-->resume  10. Hyperlipidemia:On Crestor 11. Parotid mass: Follow up with ENT after discharge.  12. Right shoulder pain: Question RTC strain--will add voltaren gel with ice for local measures.  7/31- says voltaren gel very helpful- will con't  13. Dysphagia: On Dysphagia 3, honey liquids --encourage fluid intake. Single ice chips after oral care. Upgraded to thins which should help with oral intake, will d/c IVF Monitor intake- repeat BMET in am  14. Constipation:  Last bowel movement 7/28  7/31- says not eating well/poor appetite- if no BM by tomorrow, will intervene.   8/1- LBM actually 7/29-    LOS: 7 days A FACE TO FACE EVALUATION WAS PERFORMED  Charlett Blake 04/08/2020, 8:47 AM

## 2020-04-08 NOTE — Progress Notes (Signed)
Patients last documented BM is 04/04/20. Offered PRN dulcolax and fleet enema pt declined x2. Amanda Cockayne, LPN

## 2020-04-09 ENCOUNTER — Other Ambulatory Visit: Payer: Self-pay | Admitting: Physical Medicine and Rehabilitation

## 2020-04-09 DIAGNOSIS — E782 Mixed hyperlipidemia: Secondary | ICD-10-CM

## 2020-04-09 MED ORDER — PANTOPRAZOLE SODIUM 40 MG PO TBEC: 40.0000 mg | DELAYED_RELEASE_TABLET | Freq: Every day | ORAL | 0 refills | Status: DC

## 2020-04-09 MED ORDER — ASPIRIN 325 MG PO TBEC: 325.0000 mg | DELAYED_RELEASE_TABLET | Freq: Every day | ORAL | 0 refills | Status: DC

## 2020-04-09 MED ORDER — ROSUVASTATIN CALCIUM 20 MG PO TABS: 20.0000 mg | ORAL_TABLET | Freq: Every day | ORAL | 0 refills | Status: DC

## 2020-04-09 MED ORDER — CLOPIDOGREL BISULFATE 75 MG PO TABS: 75.0000 mg | ORAL_TABLET | Freq: Every day | ORAL | 0 refills | Status: DC

## 2020-04-09 MED ORDER — DICLOFENAC SODIUM 1 % EX GEL: 2.0000 g | Freq: Four times a day (QID) | CUTANEOUS | 1 refills | Status: DC

## 2020-04-09 NOTE — Progress Notes (Signed)
[place] PHYSICAL MEDICINE & REHABILITATION PROGRESS NOTE   Subjective/Complaints:  Wife at bedside questions about using OTC sleep aid,  discussed no driving, no return to work and Left neglect  Review of systems  Pt denies SOB, abd pain, CP, N/V/C/D, and vision changes   Objective:   No results found. Recent Labs    04/07/20 0505  WBC 12.3*  HGB 13.7  HCT 40.0  PLT 311   Recent Labs    04/07/20 0505  NA 141  K 3.7  CL 108  CO2 24  GLUCOSE 98  BUN 19  CREATININE 1.10  CALCIUM 9.0    Intake/Output Summary (Last 24 hours) at 04/09/2020 1025 Last data filed at 04/09/2020 0900 Gross per 24 hour  Intake 120 ml  Output --  Net 120 ml     Physical Exam: Vital Signs Blood pressure 123/83, pulse 63, temperature 98.2 F (36.8 C), resp. rate 16, height 6' (1.829 m), weight 74.9 kg, SpO2 97 %.   General: No acute distress Mood and affect are appropriate Heart: Regular rate and rhythm no rubs murmurs or extra sounds Lungs: Clear to auscultation, breathing unlabored, no rales or wheezes Abdomen: Positive bowel sounds, soft nontender to palpation, nondistended Extremities: No clubbing, cyanosis, or edema Skin: No evidence of breakdown, no evidence of rash  . Left neglect with confrontation testing   Neurologic: Cranial nerves II through XII intact, motor strength is 5/5 in right and 4/5 left deltoid, bicep, tricep, grip, hip flexor, knee extensors, ankle dorsiflexor and plantar flexor Sensory exam normal sensation to light touch in bilateral upper and lower extremities  Musculoskeletal: Pain with abduction left shoulder- TTP over posterior shoulder/RTC- slightly less TTP  Assessment/Plan: 1. Functional deficits secondary to Right MCA infarct  Stable for D/C today F/u PCP in 3-4 weeks F/u PM&R 2 weeks See D/C summary See D/C instructions Care Tool:  Bathing  Bathing activity did not occur: Refused Body parts bathed by patient: Right arm, Left arm, Chest,  Abdomen, Front perineal area, Buttocks, Right upper leg, Left upper leg, Right lower leg, Face, Left lower leg         Bathing assist Assist Level: Supervision/Verbal cueing     Upper Body Dressing/Undressing Upper body dressing   What is the patient wearing?: Pull over shirt    Upper body assist Assist Level: Independent    Lower Body Dressing/Undressing Lower body dressing      What is the patient wearing?: Underwear/pull up, Pants     Lower body assist Assist for lower body dressing: Independent     Toileting Toileting    Toileting assist Assist for toileting: Independent     Transfers Chair/bed transfer  Transfers assist     Chair/bed transfer assist level: Independent     Locomotion Ambulation   Ambulation assist      Assist level: Independent Assistive device: No Device Max distance: 367ft   Walk 10 feet activity   Assist     Assist level: Independent Assistive device: No Device   Walk 50 feet activity   Assist    Assist level: Independent Assistive device: No Device    Walk 150 feet activity   Assist    Assist level: Independent Assistive device: No Device    Walk 10 feet on uneven surface  activity   Assist     Assist level: Supervision/Verbal cueing Assistive device: Other (comment) (no UE support)   Wheelchair     Assist Will patient use wheelchair at discharge?:  No             Wheelchair 50 feet with 2 turns activity    Assist            Wheelchair 150 feet activity     Assist          Blood pressure 123/83, pulse 63, temperature 98.2 F (36.8 C), resp. rate 16, height 6' (1.829 m), weight 74.9 kg, SpO2 97 %.    Medical Problem List and Plan: 1.Impaired function and L hemiparesis/dysphagiasecondary to R MCA CVA s/p tPA -patient may shower -ELOS/Goals: 8/4 2. Antithrombotics: -DVT/anticoagulation:Pharmaceutical:Lovenox -antiplatelet  therapy: ASA and plavix  X 3 months.   3.Headaches/Pain Management:Tylenol prn. 4. Mood:LCSW to follow for evaluation and support. -antipsychotic agents: N/A 5. Neuropsych: This patientIs not fullycapable of making decisions onhisown behalf. 6. Skin/Wound Care:Routine pressure relief measures. 7. Fluids/Electrolytes/Nutrition:Monitor I/O. Check lytes in am as on modified diet. May d/c CBG 8. QIH:KVQQ recent hypotension intermittently-Long term BP goal 130-150 due to carotid stenosis.Monitor BP tid --continue  8/1- BP 145/92- slightly elevated but still in goal- so won't change meds  9. Centrilobar Emphysema: On maintenance inhaler (Stiolto respimat) at home-->resume  10. Hyperlipidemia:On Crestor 11. Parotid mass: Follow up with ENT after discharge.  12. Right shoulder pain: Question RTC strain--will add voltaren gel with ice for local measures.  13. Dysphagia: improved upgraded to thin liq      LOS: 8 days A FACE TO FACE EVALUATION WAS PERFORMED  Ethan Fitzgerald 04/09/2020, 10:25 AM

## 2020-04-09 NOTE — Progress Notes (Signed)
Patient noted very tired during the shift. He stated that he had a good work out. He was asleep most of the shift, but arousable. He took his medications without difficulty. He refused his voltaren gel & still refuses to take bowel medications. He was independent in his room. No c/o pain or discomfort. Will continue to monitor & pass on to oncoming shift.

## 2020-04-09 NOTE — Discharge Instructions (Signed)
Inpatient Rehab Discharge Instructions  BONHAM ZINGALE Discharge date and time:  04/09/20   Activities/Precautions/ Functional Status: Activity: No lifting, driving, or strenuous exercise till cleared by MD Diet: Heart healthy/soft foods. Need to drink plenty of fluids.  Check for pocketing Wound Care: none needed   Functional status:  ___ No restrictions     ___ Walk up steps independently _X__ 24/7 supervision/assistance   ___ Walk up steps with assistance ___ Intermittent supervision/assistance  ___ Bathe/dress independently ___ Walk with walker     ___ Bathe/dress with assistance ___ Walk Independently    ___ Shower independently ___ Walk with assistance    _X__ Shower with assistance _X__ No alcohol     ___ Return to work/school ________   COMMUNITY REFERRALS UPON DISCHARGE:    Outpatient: PT     OT   ST                                    Agency: [facility]/Outpatient   Phone: [phone]             Appointment Date/Time:Initial appointment with OT scheduled for April 15, 2020 @ 0915.  *Please expect follow-up within 7-10 business days to schedule your appointment. If you have not received follow-up. Be sure to contact the site directly.*  Medical Equipment/Items Ordered: no DME needed.                                                  Special Instructions: 1. Family needs to assist with medication management for safety.  2. Blood pressure goal 130-150 range to provide adequate brain perfusion.  3. You are prediabetic--monitor your sweet/stach intake.   STROKE/TIA DISCHARGE INSTRUCTIONS SMOKING Cigarette smoking nearly doubles your risk of having a stroke & is the single most alterable risk factor  If you smoke or have smoked in the last 12 months, you are advised to quit smoking for your health.  Most of the excess cardiovascular risk related to smoking disappears within a year of stopping.  Ask you doctor about anti-smoking medications  [place] Quit Line:  1-800-QUIT NOW  Free Smoking Cessation Classes [phone]  CHOLESTEROL Know your levels; limit fat & cholesterol in your diet  Lipid Panel     Component Value Date/Time   CHOL 153 03/27/2020 0823   TRIG 115 03/27/2020 0823   HDL 48 03/27/2020 0823   CHOLHDL 3.2 03/27/2020 0823   VLDL 23 03/27/2020 0823   LDLCALC 82 03/27/2020 0823   LDLCALC  10/29/2019 0812     Comment:     . LDL cholesterol not calculated. Triglyceride levels greater than 400 mg/dL invalidate calculated LDL results. . Reference range: <100 . Desirable range <100 mg/dL for primary prevention;   <70 mg/dL for patients with CHD or diabetic patients  with > or = 2 CHD risk factors. Marland Kitchen LDL-C is now calculated using the Martin-Hopkins  calculation, which is a validated novel method providing  better accuracy than the Friedewald equation in the  estimation of LDL-C.  Cresenciano Genre et al. Annamaria Helling. [id number];017(51): 2061-2068  (http://education.[facility].com/faq/FAQ164)       Many patients benefit from treatment even if their cholesterol is at goal.  Goal: Total Cholesterol (CHOL) less than 160  Goal:  Triglycerides (TRIG) less than  150  Goal:  HDL greater than 40  Goal:  LDL (LDLCALC) less than 100   BLOOD PRESSURE American Stroke Association blood pressure target is less that 120/80 mm/Hg  Your discharge blood pressure is:  BP: (!) 138/85  Monitor your blood pressure  Limit your salt and alcohol intake  Many individuals will require more than one medication for high blood pressure  DIABETES (A1c is a blood sugar average for last 3 months) Goal HGBA1c is under 7% (HBGA1c is blood sugar average for last 3 months)  Diabetes:     Lab Results  Component Value Date   HGBA1C 5.7 (H) 03/27/2020     Your HGBA1c can be lowered with medications, healthy diet, and exercise.  Check your blood sugar as directed by your physician  Call your physician if you experience unexplained or low blood sugars.    PHYSICAL ACTIVITY/REHABILITATION Goal is 30 minutes at least 4 days per week  Activity: No driving, Therapies: see above Return to work: N/A  Activity decreases your risk of heart attack and stroke and makes your heart stronger.  It helps control your weight and blood pressure; helps you relax and can improve your mood.  Participate in a regular exercise program.  Talk with your doctor about the best form of exercise for you (dancing, walking, swimming, cycling).  DIET/WEIGHT Goal is to maintain a healthy weight  Your discharge diet is:  Diet Order            DIET DYS 3 Room service appropriate? Yes; Fluid consistency: Honey Thick  Diet effective now                liquids Your height is:  Height: 6' (182.9 cm) Your current weight is: Weight: 76.7 kg Your Body Mass Index (BMI) is:  BMI (Calculated): 22.93  Following the type of diet specifically designed for you will help prevent another stroke.  Your goal weight is:    Your goal Body Mass Index (BMI) is 19-24.  Healthy food habits can help reduce 3 risk factors for stroke:  High cholesterol, hypertension, and excess weight.  RESOURCES Stroke/Support Group:  Call [phone]   STROKE EDUCATION PROVIDED/REVIEWED AND GIVEN TO PATIENT Stroke warning signs and symptoms How to activate emergency medical system (call 911). Medications prescribed at discharge. Need for follow-up after discharge. Personal risk factors for stroke. Pneumonia vaccine given:  Flu vaccine given:  My questions have been answered, the writing is legible, and I understand these instructions.  I will adhere to these goals & educational materials that have been provided to me after my discharge from the hospital.     My questions have been answered and I understand these instructions. I will adhere to these goals and the provided educational materials after my discharge from the hospital.  Patient/Caregiver Signature _______________________________ Date  __________  Clinician Signature _______________________________________ Date __________  Please bring this form and your medication list with you to all your follow-up doctor's appointments.

## 2020-04-09 NOTE — Progress Notes (Signed)
Pt to be DC home today. Pt already up and dressed and bags packed. Wife to come in around [place] AM meds without difficulty and applied voltaren gel to L shoulder. Denies pain. Pt still hasnt had BM since 7/30 and doesn't want any medications to assist with BM. Educated on constipation and what to do at home. Pt has no questions/conerns noted. DC summary to be given before pt leaves. Will be transported to main lobby for DC as well.  Erie Noe, RN

## 2020-04-09 NOTE — Plan of Care (Signed)
  Problem: RH BOWEL ELIMINATION Goal: RH STG MANAGE BOWEL WITH ASSISTANCE Description: STG Manage Bowel with min Assistance. Outcome: Not Met (add Reason)  Pt hasn't had BM since 7/30, BS active and abdomen nontender, refusing any kind of assistance for bowels.  Problem: Consults Goal: RH STROKE PATIENT EDUCATION Description: See Patient Education module for education specifics  Outcome: Completed/Met Goal: Nutrition Consult-if indicated Outcome: Completed/Met   Problem: RH BLADDER ELIMINATION Goal: RH STG MANAGE BLADDER WITH ASSISTANCE Description: STG Manage Bladder With min Assistance Outcome: Completed/Met   Problem: RH SKIN INTEGRITY Goal: RH STG SKIN FREE OF INFECTION/BREAKDOWN Description: Pt will be free of skin breakdown with min assist  Outcome: Completed/Met Goal: RH STG MAINTAIN SKIN INTEGRITY WITH ASSISTANCE Description: STG Maintain Skin Integrity With min Assistance. Outcome: Completed/Met   Problem: RH SAFETY Goal: RH STG ADHERE TO SAFETY PRECAUTIONS W/ASSISTANCE/DEVICE Description: STG Adhere to Safety Precautions With cues/reminders  Outcome: Completed/Met   Problem: RH PAIN MANAGEMENT Goal: RH STG PAIN MANAGED AT OR BELOW PT'S PAIN GOAL Description: Pian scale <2/10 Outcome: Completed/Met   Problem: RH KNOWLEDGE DEFICIT Goal: RH STG INCREASE KNOWLEDGE OF DYSPHAGIA/FLUID INTAKE Description: Patient will be able to verbalize reasons for dysphagia diet Outcome: Completed/Met Goal: RH STG INCREASE KNOWLEGDE OF HYPERLIPIDEMIA Description: Patient will be able to list risks of hyperlipidemia Outcome: Completed/Met Goal: RH STG INCREASE KNOWLEDGE OF STROKE PROPHYLAXIS Description: Patient will be able to name medications given for stroke prophylaxis Outcome: Completed/Met

## 2020-04-09 NOTE — Progress Notes (Signed)
Inpatient Rehabilitation Care Coordinator  Discharge Note  The overall goal for the admission was met for:   Discharge location: Yes  Length of Stay: 8 days with discharge 04/09/20  Discharge activity level: Patient to discharge at overall Modified Independent level  Home/community participation: Limited  Services provided included: MD, RD, PT, OT, SLP, RN, CM, TR, Pharmacy and SW  Financial Services: Medicare  Follow-up services arranged: Outpatient: PT, OT and SLP and Patient/Family request agency HH: [facility], DME: No DME recommended; patient has equipment  Comments (or additional information): [facility] OT initial assessment scheduled for April 15, 2020 At [phone]  Patient/Family verbalized understanding of follow-up arrangements: Yes  Individual responsible for coordination of the follow-up plan: Wife  Kyriakos Babler : [phone]   Margarito Liner

## 2020-04-10 ENCOUNTER — Telehealth: Payer: Self-pay

## 2020-04-10 NOTE — Telephone Encounter (Signed)
Yes exactly. Thanks. We will keep HFU apt and discuss if we should re-schedule or keep his physical at that appointment.  [name], [facility] 04/10/2020, 1:30 PM

## 2020-04-10 NOTE — Telephone Encounter (Signed)
Copied from [place] [id number]. Topic: General - Other >> Apr 10, 2020  9:51 AM Rainey Pines A wrote: Patients wife would like a callback from Dr. Eustaquio Boyden nurse on what Dr.K would advise in regards to patient being scheduled for hosp fu next week and was just realesaed from [place] from having stroke. Patients wife wants to know does she need to cancel patients lab appt on 8/25 and the physical on 9/1 since patient is coming in next week from hosptial follow up.

## 2020-04-10 NOTE — Telephone Encounter (Signed)
Unable to reach the patient, left detail message stating he will need the hospital follow up appointment which can be before his physical and he can keep physical and lab appointment for now and if Dr Raliegh Ip wants him to have physical later will discuss in upcoming hospital follow up appointment.

## 2020-04-11 ENCOUNTER — Telehealth: Payer: Self-pay

## 2020-04-11 NOTE — Telephone Encounter (Signed)
Unable to reach the patient if he calls back please relay the message.

## 2020-04-11 NOTE — Telephone Encounter (Signed)
Left detail message. 

## 2020-04-11 NOTE — Discharge Summary (Signed)
Physician Discharge Summary  [name] MRN: [id number] DOB/AGE: 66/27/1957 66 y.o.  Admit date: 04/01/2020 Discharge date: 04/09/2020  Discharge Diagnoses:  Principal Problem:   Acute right MCA stroke [facility]) Active Problems:   Elevated hemoglobin A1c   Centrilobular emphysema (HCC)   Parotid mass   Dysphagia due to recent stroke   COPD without exacerbation (HCC)   SAH (subarachnoid hemorrhage) (HCC) secondary to IR   Discharged Condition:  Stable   Significant Diagnostic Studies: CT HEAD WO CONTRAST  Result Date: 04/04/2020 CLINICAL DATA:  Follow-up stroke EXAM: CT HEAD WITHOUT CONTRAST TECHNIQUE: Contiguous axial images were obtained from the base of the skull through the vertex without intravenous contrast. COMPARISON:  03/28/2020 FINDINGS: Brain: Again noted are changes consistent with the given clinical history of right middle cerebral artery infarct degree of subarachnoid hemorrhage has nearly completely resolved when compared with the prior exam. No new focal hemorrhage or infarct is seen. No extra-axial fluid collection is noted. Vascular: No hyperdense vessel or unexpected calcification. Skull: Normal. Negative for fracture or focal lesion. Sinuses/Orbits: No acute finding. Other: None. IMPRESSION: Right middle cerebral artery infarct with subarachnoid hemorrhage although the degree of hemorrhage has improved significantly in the interval from the prior exam. No new focal abnormality is seen. Electronically Signed   By: Inez Catalina M.D.   On: 04/04/2020 02:19   DG Swallowing Func-Speech Pathology  Result Date: 04/07/2020 Objective Swallowing Evaluation: Type of Study: MBS-Modified Barium Swallow Study  Patient Details Name: Ethan Fitzgerald MRN: [id number] Date of Birth: Nov 07, 1955 Today's Date: 04/07/2020 Past Medical History: Past Medical History: Diagnosis Date . COPD (chronic obstructive pulmonary disease) (HCC)   mild . COVID-19 09/2019 . Erectile dysfunction  .  Headache   daily, stress headaches . Knee pain, left  . Seasonal allergies  . Tobacco dependence  Past Surgical History: Past Surgical History: Procedure Laterality Date . BACK SURGERY    metal plate in back . COLONOSCOPY   . COLONOSCOPY WITH PROPOFOL N/A 04/26/2019  Procedure: COLONOSCOPY WITH PROPOFOL;  Surgeon: Jonathon Bellows, MD;  Location: [facility] ENDOSCOPY;  Service: Gastroenterology;  Laterality: N/A; . HERNIA REPAIR Right  . IR ANGIO INTRA EXTRACRAN SEL COM CAROTID INNOMINATE UNI L MOD SED  03/27/2020 . IR ANGIO VERTEBRAL SEL SUBCLAVIAN INNOMINATE UNI L MOD SED  03/27/2020 . IR ANGIO VERTEBRAL SEL VERTEBRAL UNI R MOD SED  03/27/2020 . IR CT HEAD LTD  03/27/2020 . IR PERCUTANEOUS ART THROMBECTOMY/INFUSION INTRACRANIAL INC DIAG ANGIO  03/27/2020 . KNEE ARTHROSCOPY Left 03/28/2015  Procedure: Arthroscopic partial medial meniscectomy plus chondral debridement;  Surgeon: Leanor Kail, MD;  Location: [facility];  Service: Orthopedics;  Laterality: Left; . RADIOLOGY WITH ANESTHESIA N/A 03/27/2020  Procedure: IR WITH ANESTHESIA;  Surgeon: Luanne Bras, MD;  Location: [place];  Service: Radiology;  Laterality: N/A; HPI: 64 y.o. male with history of COPD presenting with L sided weakness.  R MCA infarct s/p tPA and IR with partial revascularization. CT head 7/23: moderate-sized acute right MCA infarct centered in right frontal operculum; Hyperdensity within regional sulci, right sylvian fissure, and interpeduncular cistern may reflect a combination of subarachnoid hemorrhage and contrast.   Subjective: Pt was alert and cooperative Assessment / Plan / Recommendation CHL IP CLINICAL IMPRESSIONS 04/07/2020 Clinical Impression --Pt demonstrated improvements in oropharyngeal swallow function resulting in better ability to protect his airway today in comparison to last MBSS (which was conducted 03/30/20). Swallow initiation is becoming more timely, occurring more consistently at the level of the vallecula  and only  intermittently at the pyriform sinuses, with no pooling noted. Early in study, thin barium consumed via cup penetrated into laryngeal vestibule X1 but did not contact the vocal folds and 90% of barium appeared to eject from the vestibule with an extra dry swallow, which was sensed and initiated by pt without need for cueing. This instance of penetration would classify as 2 on PAS scale. No additional penetration or aspiration was observed during today's study. Although pt continued to have mild vallecular and pyriform sinus residue of thin, he consistently uses extra dry swallows which is effective to clear without airway intrusion. SLP reinforced that pt should continue to utilize extra dry swallow to minimize aspiration risk and pt in agreement. Due to oral phase impairments s/p reduced left sided facial sensation and weakness, pt unable to propel barium pill posteriorly as cohesive bolus with thin and was ultimately expectorated. Would recommend pt continue Dysphagia 3 solid textures and pills whole in applesauce one at a time, upgrade to thin liquids via cup or straw but must take 1 small sip at a time and perform extra dry swallow after each sip. Pt should continue to follow solids with liquids and use slow rate during PO intake. ST will continue to provide skilled interventions and education for dysphagia while inpatient to ensure diet safety and efficiency prior to discharge. SLP Visit Diagnosis Dysphagia, oropharyngeal phase (R13.12) Attention and concentration deficit following -- Frontal lobe and executive function deficit following -- Impact on safety and function Mild aspiration risk   CHL IP TREATMENT RECOMMENDATION 03/30/2020 Treatment Recommendations Therapy as outlined in treatment plan below   Prognosis 03/30/2020 Prognosis for Safe Diet Advancement Good Barriers to Reach Goals -- Barriers/Prognosis Comment -- CHL IP DIET RECOMMENDATION 04/07/2020 SLP Diet Recommendations Dysphagia 3 (Mech soft)  solids;Thin liquid Liquid Administration via Cup;Straw Medication Administration Whole meds with puree Compensations Slow rate;Small sips/bites;Follow solids with liquid;Multiple dry swallows after each bite/sip;Lingual sweep for clearance of pocketing Postural Changes Seated upright at 90 degrees   CHL IP OTHER RECOMMENDATIONS 04/07/2020 Recommended Consults -- Oral Care Recommendations Oral care BID Other Recommendations --   CHL IP FOLLOW UP RECOMMENDATIONS 04/01/2020 Follow up Recommendations Inpatient Rehab   CHL IP FREQUENCY AND DURATION 03/30/2020 Speech Therapy Frequency (ACUTE ONLY) min 2x/week Treatment Duration 2 weeks      CHL IP ORAL PHASE 04/07/2020 Oral Phase Impaired Oral - Pudding Teaspoon -- Oral - Pudding Cup -- Oral - Honey Teaspoon -- Oral - Honey Cup NT Oral - Nectar Teaspoon -- Oral - Nectar Cup NT Oral - Nectar Straw NT Oral - Thin Teaspoon NT Oral - Thin Cup Lingual/palatal residue;Decreased bolus cohesion;Premature spillage Oral - Thin Straw Lingual/palatal residue;Premature spillage;Decreased bolus cohesion Oral - Puree NT Oral - Mech Soft -- Oral - Regular NT Oral - Multi-Consistency -- Oral - Pill -- Oral Phase - Comment --  CHL IP PHARYNGEAL PHASE 04/07/2020 Pharyngeal Phase Impaired Pharyngeal- Pudding Teaspoon -- Pharyngeal -- Pharyngeal- Pudding Cup -- Pharyngeal -- Pharyngeal- Honey Teaspoon -- Pharyngeal -- Pharyngeal- Honey Cup NT Pharyngeal -- Pharyngeal- Nectar Teaspoon -- Pharyngeal -- Pharyngeal- Nectar Cup NT Pharyngeal -- Pharyngeal- Nectar Straw NT Pharyngeal -- Pharyngeal- Thin Teaspoon NT Pharyngeal -- Pharyngeal- Thin Cup Delayed swallow initiation-vallecula;Delayed swallow initiation-pyriform sinuses;Reduced airway/laryngeal closure;Penetration/Aspiration before swallow;Pharyngeal residue - valleculae;Pharyngeal residue - pyriform;Compensatory strategies attempted (with notebox);Reduced anterior laryngeal mobility;Reduced laryngeal elevation;Reduced epiglottic inversion  Pharyngeal Material enters airway, remains ABOVE vocal cords then ejected out Pharyngeal- Thin Straw Delayed swallow initiation-vallecula;Delayed swallow initiation-pyriform  sinuses;Reduced anterior laryngeal mobility;Reduced laryngeal elevation;Reduced epiglottic inversion;Pharyngeal residue - valleculae;Pharyngeal residue - pyriform Pharyngeal Material does not enter airway Pharyngeal- Puree NT Pharyngeal -- Pharyngeal- Mechanical Soft -- Pharyngeal -- Pharyngeal- Regular NT Pharyngeal -- Pharyngeal- Multi-consistency -- Pharyngeal -- Pharyngeal- Pill -- Pharyngeal -- Pharyngeal Comment --  CHL IP CERVICAL ESOPHAGEAL PHASE 04/07/2020 Cervical Esophageal Phase WFL Pudding Teaspoon -- Pudding Cup -- Honey Teaspoon -- Honey Cup -- Nectar Teaspoon -- Nectar Cup -- Nectar Straw -- Thin Teaspoon -- Thin Cup -- Thin Straw -- Puree -- Mechanical Soft -- Regular -- Multi-consistency -- Pill -- Cervical Esophageal Comment -- Ethan Fitzgerald 04/07/2020, 10:16 AM               Labs:  Basic Metabolic Panel: BMP Latest Ref Rng & Units 04/07/2020 04/02/2020 04/01/2020  Glucose 70 - 99 mg/dL 98 100(H) 94  BUN 8 - 23 mg/dL 19 19 13   Creatinine 0.61 - 1.24 mg/dL 1.10 0.85 0.95  BUN/Creat Ratio 6 - 22 (calc) - - -  Sodium 135 - 145 mmol/L 141 140 136  Potassium 3.5 - 5.1 mmol/L 3.7 3.6 3.5  Chloride 98 - 111 mmol/L 108 110 106  CO2 22 - 32 mmol/L 24 22 22   Calcium 8.9 - 10.3 mg/dL 9.0 9.3 9.0    CBC: CBC Latest Ref Rng & Units 04/07/2020 04/02/2020 04/01/2020  WBC 4.0 - 10.5 K/uL 12.3(H) 10.2 10.4  Hemoglobin 13.0 - 17.0 g/dL 13.7 14.8 14.2  Hematocrit 39 - 52 % 40.0 42.1 40.4  Platelets 150 - 400 K/uL 311 272 276    CBG: No results for input(s): GLUCAP in the last 168 hours.   Brief HPI:   Ethan Fitzgerald is a 64 y.o. left-handed male with history of COPD, daily headaches, dyslipidemia increasing coughing episodes was admitted 03/27/2020 with left-sided weakness, difficulty walking and difficulty speaking.  CT head  showed dense M1 segment with occlusive thrombus, occlusion of left ICA age-indeterminate with distal reconstitution of cavernous segment and good perfusion of left MCA and bilateral ACAs.  He received TPA and underwent cerebral angio with partial revascularization of right-MCA dominant division with unsuccessful attempts at revascularization of proximal right ICA.  Follow-up MRI brain showed acute cortical/subcortical right MCA infarct with additional infarcts in right caudate nucleus, right pre and postcentral gyri and mid to anterior frontal lobe with SAH and incidental right parotid neoplasm.    Dr. Leonie Man question stroke due to dissection of large vessel from coughing episodes versus hypercoagulopathy state from parotid neoplasm.  Patient was maintained on aspirin alone with recommendations to repeat CT head on 07/30 and then consider DAPT x3 months if [facility] had resolved.  Blood pressure goals recommended 130- 150 due to intracranial stenosis.  He was placed on dysphagia 3 diet, honey liquids due to oropharyngeal dysphagia.  Patient with resultant left and attention with impulsivity, left-sided weakness and balance deficits affecting mobility and ADLs.  CIR was recommended due to functional decline.   Hospital Course: Ethan Fitzgerald was admitted to rehab 04/01/2020 for inpatient therapies to consist of PT, ST and OT at least three hours five days a week. Past admission physiatrist, therapy team and rehab RN have worked together to provide customized collaborative inpatient rehab.  He was maintained on aspirin till 07/30 1 repeat CT head done revealing degree of hemorrhage to have improved significantly.  Neurology was contacted for input and recommended addition of Plavix to aspirin for secondary stroke prevention.  Follow-up CBC shows H&H&Plt to be  stable.  Mild elevation in white count noted however no fevers or signs of infection noted. Blood pressures were monitored on TID basis and have been controlled  without medications.  Respiratory status has improved with resumption of Stiolto Respimat.    Right shoulder pain question due to strain has improved with use of Voltaren gel as ice for local measures.  He was maintained on IV fluids till swallow function has improved and he was advanced to dysphagia 3, thin liquids with aspiration precaution.  Blood sugars were monitored on ac/hs basis for a few days and have been well controlled therefore cbgs were discontinued.  He has made good gains during his rehab stay and is currently at modified independent to supervision is recommended for safety due to left inattention.    He will continue to receive further follow-up help PT, OT and ST at [facility] outpatient rehab after discharge   Rehab course: During patient's stay in rehab team conference was held to monitor patient's progress, set goals and discuss barriers to discharge. At admission, patient required min assist with mobility and with basic self-care tasks. He exhibited mild dysarthria requiring min verbal cues for rate and articulation.  He exhibited mild to moderate dysphagia requiring dysphagia 3, honey liquids with intermittent supervision for aspiration precautions. He  has had improvement in activity tolerance, balance, postural control as well as ability to compensate for deficits. He is able to complete ADL tasks.  He is modified independent for transfers and is able to ambulate 3 and 50 feet without assistive device.  He requires supervision to climb 12 stairs.  Speech intelligibility has improved and he is able to use compensatory swallow strategies with supervision to help manage dysarthria dysphagia.  Family education was completed regarding all aspects of safety and care.  Disposition: Home  Diet: Heart healthy/Carb Modified.  Special Instructions: 1. Blood pressure goal 130-150 range 2. Monitor sweets and starch intake.  3 Will need referral to ENT for follow-up on parotid  mass.    Discharge Instructions    Ambulatory referral to Physical Medicine Rehab   Complete by: As directed    1-2 weeks TC appointment   Ambulatory referral to Physical Therapy   Complete by: As directed    Eval and treat   Ambulatory referral to Speech Therapy   Complete by: As directed    Evaluate and treat   Ambulatory referral to Speech Therapy   Complete by: As directed    Evaluate and treat     Allergies as of 04/09/2020   No Known Allergies     Medication List    STOP taking these medications   acetaminophen 325 MG tablet Commonly known as: TYLENOL   aspirin 81 MG chewable tablet Replaced by: aspirin 325 MG EC tablet   chlorhexidine 0.12 % solution Commonly known as: PERIDEX   Fish Oil 1000 MG Caps   insulin aspart 100 UNIT/ML injection Commonly known as: novoLOG   Resource ThickenUp Clear Powd   sodium chloride 0.9 % infusion     TAKE these medications   albuterol 108 (90 Base) MCG/ACT inhaler Commonly known as: Ventolin HFA Inhale 1-2 puffs into the lungs every 4 (four) hours as needed for wheezing or shortness of breath.   aspirin 325 MG EC tablet Take 1 tablet (325 mg total) by mouth daily. Replaces: aspirin 81 MG chewable tablet   clopidogrel 75 MG tablet Commonly known as: PLAVIX Take 1 tablet (75 mg total) by mouth daily.   diclofenac  Sodium 1 % Gel Commonly known as: VOLTAREN Apply 2 g topically 4 (four) times daily. Notes to patient: Works best if you use this consistently   pantoprazole 40 MG tablet Commonly known as: PROTONIX Take 1 tablet (40 mg total) by mouth at bedtime. Notes to patient: For reflux   rosuvastatin 20 MG tablet Commonly known as: CRESTOR Take 1 tablet (20 mg total) by mouth daily. What changed: medication strength   Stiolto Respimat 2.5-2.5 MCG/ACT Aers Generic drug: Tiotropium Bromide-Olodaterol Inhale 2 puffs into the lungs daily.   vitamin C 500 MG tablet Commonly known as: ASCORBIC ACID Take 500 mg  by mouth daily. Am       Follow-up Information    Kirsteins, Luanna Salk, MD Follow up.   Specialty: Physical Medicine and Rehabilitation Why: Office will call you with follow up appointment Contact information: [place] [address] [phone]        [place]. Call.   Why: for post stroke follow up Contact information: [address]      [address] [place], [place], DO. Call.   Specialty: Family Medicine Why: for post hospital follow up Contact information: [place]  [address] [phone]               Signed: Bary Leriche 04/14/2020, 5:05 PM

## 2020-04-11 NOTE — Telephone Encounter (Signed)
This nurse attempted to call patient twice for a TCM call. Once at 9:35 and again at 15:55. Messages left both times.

## 2020-04-14 ENCOUNTER — Telehealth: Payer: Self-pay | Admitting: Physical Medicine and Rehabilitation

## 2020-04-14 ENCOUNTER — Telehealth: Payer: Self-pay | Admitting: Pharmacist

## 2020-04-14 ENCOUNTER — Telehealth: Payer: Self-pay | Admitting: *Deleted

## 2020-04-14 ENCOUNTER — Ambulatory Visit: Payer: Self-pay | Admitting: *Deleted

## 2020-04-14 ENCOUNTER — Telehealth: Payer: Self-pay

## 2020-04-14 NOTE — Telephone Encounter (Addendum)
Wife calling in.  Neurosurgeon wants wants his BP to stay between 386-854 systolic because he was just discharged home from the hospital Wed. From having a stroke. She is concerned because his BP is staying below those parameters since Sunday. I asked if she had notified the neurosurgeon she said not yet but I will.   Do you think I should?   I let her know yes to contact him and I\'m going to send a note to Dr. Karamalegos letting him know what is going on too. They have an appt with Dr. Karamalegos on Wed for their post hospital check up.  See triage notes for full details.  I sent my notes high priority to [facility] and I called to let them know I had sent the note over.   They have it and she is sending it to Dr. Karamalegos now.      Answer Assessment - Initial Assessment Questions 1. BLOOD PRESSURE: "What is the blood pressure?" "Did you take at least two measurements 5 minutes apart?"   Wife Joan Deveney with [facility] in the background called in.  He just got home from the hospital Wed. From having a stroke.  The neurosurgeon wants his BP to stay 120-140 the top number.    His BP is running low and I\'m concerned. 2. ONSET: "When did you take your blood pressure?"     It\'s 109/75 now.  A little earlier today it was 106/70.  His pulse is in the 60s.   "I\'ve been checking his BP since Sunday and it\'s staying low. 3. HOW: "How did you obtain the blood pressure?" (e.g., visiting nurse, automatic home BP monitor)     BP cuff at home. 4. HISTORY: "Do you have a history of low blood pressure?" "What is your blood pressure normally?"     No but neurosurgeon wants it higher because of the stroke.   To help with the blood flow in his brain.   So I\'m concerned it\'s staying low. He is not having any symptoms like dizziness, unsteady walking, speech clear, spots before his eyes or any of the stroke symptoms.   He only c/o being tired.   But the low BP concerns me. 5. MEDICATIONS: "Are  you taking any medications for blood pressure?" If Yes, ask: "Have they been changed recently?"     They changed his medications in the hospital. 6. PULSE RATE: "Do you know what your pulse rate is?"      60 s 7. OTHER SYMPTOMS: "Have you been sick recently?" "Have you had a recent injury?"     Had surgery after his stroke. 8. PREGNANCY: "Is there any chance you are pregnant?" "When was your last menstrual period?"     N/A  Protocols used: BLOOD PRESSURE - LOW-A-AH

## 2020-04-14 NOTE — Telephone Encounter (Addendum)
Mrs Russom called and is concerned about MR Agostini's BP being lower than the parameters she was told.  She reports it is 106/76.  Pam could you give her a call . #[phone].

## 2020-04-14 NOTE — Telephone Encounter (Signed)
Contacted wife regarding BP concerns. Discussed that BP during rehab did range from 110-120 during most of his stay. Patient without fevers,chills, cough (bronchitis has resolved) or signs of UTI.  Encouraged increasing fluid intake--one cup of coffee is reasonable. He is has poor energy levels--no different c/w rehab stay. He starts outpatient therapy tomorrow and she is trying to keep him active during the day. Advised to keep appt with PCP this week.

## 2020-04-14 NOTE — Telephone Encounter (Signed)
Patient's wife informed as per Dr Raliegh Ip she will work on diet and hydration and reach out to [facility].

## 2020-04-14 NOTE — Telephone Encounter (Signed)
Reviewed his hospital records and the recent triage not today.  The parameters from hospital were at one point SBP should be 130-150, but seems that has been reduced to 120-140 since discharge from hospital.  He does not have a history of Hypertension. He is currently not taking any anti-HTN medication.  Therefore, there is no medication for me to adjust to help maintain his BP goal.  -----  Please notify patient that:  To temporarily raise his BP - he should work on appropriate nutrition and hydration. He may add some sodium/salty foods to diet as well temporarily to help raise his BP.  I certainly would at this point ask him to call his Neurosurgery team to get their input on the BP readings and the goals.  Typically there is a window of time that the BP should stay in the desired range. I do not see a definitive range of time that he should have that Bp goal. They may have more updated recommendations for him.  Ethan Fitzgerald, [facility] 04/14/2020, 3:25 PM

## 2020-04-14 NOTE — Telephone Encounter (Signed)
°  Chronic Care Management   Outreach Note  04/14/2020 Name: Ethan Fitzgerald MRN: [id number] DOB: [birth year]  Referred by: Olin Hauser, DO Reason for referral : No chief complaint on file.  Was unable to reach patient via telephone today and have left HIPAA compliant voicemail asking patient to return my call.   Follow Up Plan: Will collaborate with Care Guide to outreach to schedule follow up with me  Harlow Asa, PharmD, [facility] [phone]

## 2020-04-15 ENCOUNTER — Encounter: Payer: Self-pay | Admitting: Occupational Therapy

## 2020-04-15 ENCOUNTER — Ambulatory Visit: Payer: Medicare HMO | Attending: Physical Medicine & Rehabilitation | Admitting: Occupational Therapy

## 2020-04-15 ENCOUNTER — Other Ambulatory Visit: Payer: Self-pay

## 2020-04-15 DIAGNOSIS — R278 Other lack of coordination: Secondary | ICD-10-CM | POA: Diagnosis not present

## 2020-04-15 DIAGNOSIS — M6281 Muscle weakness (generalized): Secondary | ICD-10-CM | POA: Diagnosis not present

## 2020-04-15 DIAGNOSIS — I63511 Cerebral infarction due to unspecified occlusion or stenosis of right middle cerebral artery: Secondary | ICD-10-CM | POA: Diagnosis not present

## 2020-04-15 DIAGNOSIS — R41841 Cognitive communication deficit: Secondary | ICD-10-CM | POA: Insufficient documentation

## 2020-04-15 NOTE — Therapy (Signed)
[place] MAIN [facility] SERVICES [address] [place], [place], [address] Phone: [phone]   Fax:  [phone]  Occupational Therapy Evaluation  Patient Details  Name: Ethan Fitzgerald MRN: [id number] Date of Birth: [birth year] Referring Provider (OT): Ethan Fitzgerald   Encounter Date: 04/15/2020   OT End of Session - 04/18/20 1356    Visit Number 1    Number of Visits 24    Date for OT Re-Evaluation 07/08/20    OT Start Time 0915    OT Stop Time 1015    OT Time Calculation (min) 60 min    Activity Tolerance Patient tolerated treatment well    Behavior During Therapy [facility] for tasks assessed/performed           Past Medical History:  Diagnosis Date  . COPD (chronic obstructive pulmonary disease) (HCC)    mild  . COVID-19 09/2019  . Erectile dysfunction   . Headache    daily, stress headaches  . Knee pain, left   . Seasonal allergies   . Tobacco dependence     Past Surgical History:  Procedure Laterality Date  . BACK SURGERY     metal plate in back  . COLONOSCOPY    . COLONOSCOPY WITH PROPOFOL N/A 04/26/2019   Procedure: COLONOSCOPY WITH PROPOFOL;  Surgeon: Ethan Bellows, MD;  Location: [facility] ENDOSCOPY;  Service: Gastroenterology;  Laterality: N/A;  . HERNIA REPAIR Right   . IR ANGIO INTRA EXTRACRAN SEL COM CAROTID INNOMINATE UNI L MOD SED  03/27/2020  . IR ANGIO VERTEBRAL SEL SUBCLAVIAN INNOMINATE UNI L MOD SED  03/27/2020  . IR ANGIO VERTEBRAL SEL VERTEBRAL UNI R MOD SED  03/27/2020  . IR CT HEAD LTD  03/27/2020  . IR PERCUTANEOUS ART THROMBECTOMY/INFUSION INTRACRANIAL INC DIAG ANGIO  03/27/2020  . KNEE ARTHROSCOPY Left 03/28/2015   Procedure: Arthroscopic partial medial meniscectomy plus chondral debridement;  Surgeon: Ethan Kail, MD;  Location: [place];  Service: Orthopedics;  Laterality: Left;  . RADIOLOGY WITH ANESTHESIA N/A 03/27/2020   Procedure: IR WITH ANESTHESIA;  Surgeon: Ethan Bras, MD;  Location: [place];   Service: Radiology;  Laterality: N/A;    There were no vitals filed for this visit.   Subjective Assessment - 04/18/20 1350    Subjective  Patient reports he got some elastic bands to use at home, putty, poker chips to use at home for home program.   Patient denies pain at the moment but reports he had pain in the am, 7/10 in his left shoulder which improved with use.    Pertinent History Ethan Fitzgerald is a 64 y.o. left-handed male with history of COPD, daily headaches, dyslipidemia increasing coughing episodes was admitted 03/27/2020 with left-sided weakness, difficulty walking and difficulty speaking.  CT head showed dense M1 segment with occlusive thrombus, occlusion of left ICA age-indeterminate with distal reconstitution of cavernous segment and good perfusion of left MCA and bilateral ACAs.  He received TPA and underwent cerebral angio with partial revascularization of right-MCA dominant division with unsuccessful attempts at revascularization of proximal right ICA.  Follow-up MRI brain showed acute cortical/subcortical right MCA infarct with additional infarcts in right caudate nucleus, right pre and postcentral gyri and mid to anterior frontal lobe with SAH and incidental right parotid neoplasm.    Patient Stated Goals Patient would like to be able to work again, needs to be able to drive to return.    Currently in Pain? No/denies    Pain Score 0-No pain  [facility] OT Assessment - 04/18/20 1351      Assessment   Medical Diagnosis CVA    Referring Provider (OT) Ethan Fitzgerald    Onset Date/Surgical Date 03/27/20    Hand Dominance Left    Prior Therapy Inpatient rehab, OT, SLP, PT      Precautions   Precaution Comments with recent hypotension intermittently-Long term BP goal 130-150 due to carotid stenosis. Monitor BP tid --continue      Balance Screen   Has the patient fallen in the past 6 months Yes    How many times? once during event    Has the patient had a decrease in  activity level because of a fear of falling?  No    Is the patient reluctant to leave their home because of a fear of falling?  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  Was in the middle of a bathroom reno when hospitalized.  Has not returned to higher level household chores, such as mopping, sweeping, maintenance.  Grandson mows the lawn.        IADL   Prior Level of Function Shopping Independent    Shopping Needs to be accompanied on any shopping trip    Prior Level of Function Light Housekeeping independent    Light Housekeeping Needs help with all home maintenance tasks    Prior Level of Function Meal Prep independent    Meal Prep Needs to have meals prepared and served    Prior Level of Function Scientist, research (physical sciences) Relies on  family or friends for transportation    Prior Level of Function Medication Managment independent    Medication Management Is responsible for taking medication in correct dosages at correct time    Prior Level of Function Financial Management wife has always done finances.      Vision - History   Additional Comments Reports he had some issues in the past with dry eyes, wears reading glasses. Decreased attention to left side.       Vision Assessment   Comment left neglect present      Observation/Other Assessments   Focus on Therapeutic Outcomes (FOTO)  50  Sensation   Light Touch Appears Intact    Stereognosis Appears Intact    Hot/Cold Appears Intact    Additional Comments Numbness and tingling in left hand.        Coordination   Finger Nose Finger Test left UE mild impairment    Right 9 Hole Peg Test 27    Left 9 Hole Peg Test 36    Coordination impaired rapid alternating movements.       AROM   Right Shoulder Flexion 155 Degrees    Right Shoulder ABduction 170 Degrees    Left Shoulder Flexion 118 Degrees    Left Shoulder ABduction 160 Degrees      Strength   Overall Strength Comments RUE 5/5, left UE 4/5      Hand Function   Right Hand Grip (lbs) 80    Right Hand Lateral Pinch 22 lbs    Right Hand 3 Point Pinch 21 lbs    Left Hand Grip (lbs) 58    Left Hand Lateral Pinch 20 lbs    Left 3 point pinch 17 lbs                           OT Education - 04/18/20 1356    Education Details Role of OT, goals, plan of care    Person(s) Educated Patient    Methods Explanation    Comprehension Verbalized understanding               OT Long Term Goals - 04/18/20 1400      OT LONG TERM GOAL #1   Title Patient will be independent with home exercise program for strengthening and coordination    Baseline will need modifications to current HEP as he progresses    Time 12    Period Weeks    Status New    Target Date 07/08/20      OT LONG TERM  GOAL #2   Title Patient will complete homemaking tasks of vacuuming and sweeping with modified independence and good balance    Baseline difficulty at eval    Time 6    Period Weeks    Status New    Target Date 05/29/20      OT LONG TERM GOAL #3   Title Patient will improve left shoulder flexion by 20 degrees to assist with placing items in and out of the closet.    Baseline left shoulder flex 118    Time 6    Period Weeks    Status New    Target Date 05/29/20      OT LONG TERM GOAL #4   Title Pt will improve left UE strength by 1 mm grade to assist with performing household maintenance tasks.    Baseline 4/5 left UE    Time 6    Period Weeks    Status New    Target Date 05/29/20      OT LONG TERM GOAL #5   Title Patient will complete tub/shower transfer with modified independence and good safety    Baseline supervision    Time 6    Period Weeks    Status New    Target Date 05/29/20      Long Term Additional Goals   Additional Long Term Goals Yes      OT LONG TERM GOAL #6   Title Patient will perform meal preparation with modified independence.    Baseline unable at eval  Time 12    Period Weeks    Status New    Target Date 07/08/20      OT LONG TERM GOAL #7   Title Pt will demonstrate ability to use non power tools for home maintenance tasks with good safety awareness and coordination.    Baseline difficulty at eval    Time 12    Period Weeks    Status New    Target Date 07/08/20      OT LONG TERM GOAL #8   Title Patient will increase FOTO score to equal to or greater than 76  to demonstrate statistically significant improvement in ADL/IADLs and quality of life.    Baseline FOTO at eval 65    Time 12    Period Weeks    Status New    Target Date 07/08/20                 Plan - 04/18/20 1357    Clinical Impression Statement Per chart: Ethan Fitzgerald is a 64 y.o. left-handed male with history of COPD, daily headaches, dyslipidemia increasing  coughing episodes was admitted 03/27/2020 with left-sided weakness, difficulty walking and difficulty speaking. CT head showed dense M1 segment with occlusive thrombus, occlusion of left ICA age-indeterminate with distal reconstitution of cavernous segment and good perfusion of left MCA and bilateral ACAs. He received TPA and underwent cerebral angio with partial revascularization of right-MCA dominant division with unsuccessful attempts at revascularization of proximal right ICA. Follow-up MRI brain showed acute cortical/subcortical right MCA infarct with additional infarcts in right caudate nucleus, right pre and postcentral gyri and mid to anterior frontal lobe with SAH and incidental right parotid neoplasm. Patient was referred for OP OT evaluation after discharge from inpatient rehab.  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  Patient would benefit from skilled OT services to maximize safety and independence in necessary daily tasks at home, community and potential return to work tasks if indicated.    OT Occupational Profile and History Detailed Assessment- Review of Records and additional review of physical, cognitive, psychosocial history related to current functional performance    Occupational performance deficits (Please refer to evaluation for details): ADL's;IADL's;Work;Leisure    Body Structure / Function / Physical Skills ADL;Dexterity;ROM;Strength;Vision;Balance;Coordination;FMC;IADL;Pain;UE functional use;GMC    Psychosocial Skills Environmental  Adaptations;Routines and Behaviors;Habits    Rehab Potential Good    Clinical Decision Making Limited treatment options, no task modification necessary    Comorbidities Affecting Occupational Performance:  May have comorbidities impacting occupational performance    Modification or Assistance to Complete Evaluation  No modification of tasks or assist necessary to complete eval    OT Frequency 2x / week    OT Duration 12 weeks    OT Treatment/Interventions Self-care/ADL training;Cryotherapy;Therapeutic exercise;DME and/or AE instruction;Functional Mobility Training;Balance training;Neuromuscular education;Manual Therapy;Visual/perceptual remediation/compensation;Moist Heat;Contrast Bath;Therapeutic activities;Patient/family education    Consulted and Agree with Plan of Care Patient           Patient will benefit from skilled therapeutic intervention in order to improve the following deficits and impairments:   Body Structure / Function / Physical Skills: ADL, Dexterity, ROM, Strength, Vision, Balance, Coordination, FMC, IADL, Pain, UE functional use, GMC   Psychosocial Skills: Environmental  Adaptations, Routines and Behaviors, Habits   Visit Diagnosis: Acute right MCA stroke ([place])  Muscle weakness (generalized)  Other lack of coordination    Problem List Patient Active Problem List   Diagnosis Date Noted  . History of cerebrovascular accident (CVA) with residual deficit 04/16/2020  . ICAO (internal carotid artery occlusion), left 04/16/2020  . Hemiplegia of dominant side following cerebrovascular accident (CVA) ([place]) 04/16/2020  . 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 Parotid mass 03/31/2020  . Carotid stenosis 03/31/2020  . Arterial hypotension 03/31/2020  . Dysphagia due to recent stroke 03/31/2020  . COPD without exacerbation ([place]) 03/31/2020  . Hypokalemia 03/31/2020  . Right shoulder pain 03/31/2020  . SAH (subarachnoid hemorrhage) ([place]) secondary to IR 03/31/2020  . Stroke (cerebrum) (HCC) - mult B anterior infarcts s/p tPA + R MCA revascularizion. 03/27/2020  . Middle cerebral artery embolism, right 03/27/2020  . History  of COVID-19 12/04/2019  . Centrilobular emphysema ([place]) 04/03/2019  . Seasonal allergies 02/18/2017  . Elevated hemoglobin A1c 02/18/2017  . Erectile dysfunction 02/18/2017  . Hyperlipidemia 02/18/2017  . History of hemorrhoids 02/18/2017  . Former smoker 02/18/2017  . Chronic low back pain 02/18/2017  . Chronic pain of left knee 02/18/2017  Ashima Shrake T Story Conti, OTR/L, CLT   Archie Atilano 04/18/2020, 2:23 PM  [place] MAIN [facility] SERVICES [address] [place], [place], [address] Phone: [phone]   Fax:  [phone]  Name: Ethan Fitzgerald MRN: [id number] Date of Birth: [birth year]

## 2020-04-16 ENCOUNTER — Other Ambulatory Visit: Payer: Self-pay

## 2020-04-16 ENCOUNTER — Ambulatory Visit (INDEPENDENT_AMBULATORY_CARE_PROVIDER_SITE_OTHER): Payer: Medicare HMO | Admitting: Family Medicine

## 2020-04-16 ENCOUNTER — Encounter (HOSPITAL_COMMUNITY): Payer: Self-pay | Admitting: *Deleted

## 2020-04-16 ENCOUNTER — Emergency Department (HOSPITAL_COMMUNITY)
Admission: EM | Admit: 2020-04-16 | Discharge: 2020-04-16 | Disposition: A | Discharge status: Home / Self Care | Payer: Medicare HMO | Attending: Emergency Medicine | Admitting: Emergency Medicine

## 2020-04-16 ENCOUNTER — Encounter: Payer: Self-pay | Admitting: Family Medicine

## 2020-04-16 VITALS — BP 90/60 | HR 79 | Temp 97.5°F | Resp 16 | Ht 72.0 in | Wt 160.0 lb

## 2020-04-16 DIAGNOSIS — I6522 Occlusion and stenosis of left carotid artery: Secondary | ICD-10-CM | POA: Diagnosis not present

## 2020-04-16 DIAGNOSIS — I6930 Unspecified sequelae of cerebral infarction: Secondary | ICD-10-CM | POA: Diagnosis not present

## 2020-04-16 DIAGNOSIS — I9589 Other hypotension: Secondary | ICD-10-CM

## 2020-04-16 DIAGNOSIS — I959 Hypotension, unspecified: Secondary | ICD-10-CM | POA: Insufficient documentation

## 2020-04-16 DIAGNOSIS — Z5321 Procedure and treatment not carried out due to patient leaving prior to being seen by health care provider: Secondary | ICD-10-CM | POA: Insufficient documentation

## 2020-04-16 DIAGNOSIS — I69359 Hemiplegia and hemiparesis following cerebral infarction affecting unspecified side: Secondary | ICD-10-CM

## 2020-04-16 DIAGNOSIS — I69322 Dysarthria following cerebral infarction: Secondary | ICD-10-CM

## 2020-04-16 DIAGNOSIS — R531 Weakness: Secondary | ICD-10-CM | POA: Diagnosis not present

## 2020-04-16 DIAGNOSIS — Z8673 Personal history of transient ischemic attack (TIA), and cerebral infarction without residual deficits: Secondary | ICD-10-CM | POA: Insufficient documentation

## 2020-04-16 LAB — CBC
HCT: 46.6 % (ref 39.0–52.0)
Hemoglobin: 15.7 g/dL (ref 13.0–17.0)
MCH: 32.8 pg (ref 26.0–34.0)
MCHC: 33.7 g/dL (ref 30.0–36.0)
MCV: 97.5 fL (ref 80.0–100.0)
Platelets: 337 10*3/uL (ref 150–400)
RBC: 4.78 MIL/uL (ref 4.22–5.81)
RDW: 12.6 % (ref 11.5–15.5)
WBC: 10 10*3/uL (ref 4.0–10.5)
nRBC: 0 % (ref 0.0–0.2)

## 2020-04-16 LAB — URINALYSIS, ROUTINE W REFLEX MICROSCOPIC
Bilirubin Urine: NEGATIVE
Glucose, UA: NEGATIVE mg/dL
Hgb urine dipstick: NEGATIVE
Ketones, ur: NEGATIVE mg/dL
Leukocytes,Ua: NEGATIVE
Nitrite: NEGATIVE
Protein, ur: NEGATIVE mg/dL
Specific Gravity, Urine: 1.028 (ref 1.005–1.030)
pH: 5 (ref 5.0–8.0)

## 2020-04-16 LAB — BASIC METABOLIC PANEL
Anion gap: 10 (ref 5–15)
BUN: 23 mg/dL (ref 8–23)
CO2: 24 mmol/L (ref 22–32)
Calcium: 10.2 mg/dL (ref 8.9–10.3)
Chloride: 106 mmol/L (ref 98–111)
Creatinine, Ser: 1.03 mg/dL (ref 0.61–1.24)
GFR calc Af Amer: >60 mL/min (ref >60)
GFR calc non Af Amer: >60 mL/min (ref >60)
Glucose, Bld: 100 mg/dL — ABNORMAL HIGH (ref 70–99)
Potassium: 4.3 mmol/L (ref 3.5–5.1)
Sodium: 140 mmol/L (ref 135–145)

## 2020-04-16 LAB — LACTIC ACID, PLASMA: Lactic Acid, Venous: 0.9 mmol/L (ref 0.5–1.9)

## 2020-04-16 NOTE — ED Provider Notes (Signed)
64 yo male with recent stroke with ongoing left hand weakness after tpa, post ir bleed.  Patient home for one week.  Seen by MD today and bp 80s. Sent to ED for further evaluation.  NO chest pain, fever, chills, nausea vomiting, light headeness, chest pain  Patient not on bp meds  WDWN male  Nad sbp 110  Plan ekg, labs, ns, orthostatics Reevaluation.    Pattricia Boss, MD 04/16/20 1416

## 2020-04-16 NOTE — Progress Notes (Signed)
Subjective:    [name], male    DOB: [birth year], 64 y.o.   MRN: [id number]  Ethan Fitzgerald is a 64 y.o. male presenting on 04/16/2020 for Hospitalization Follow-up ( Acute right MCA stroke [facility]))   HPI   HOSPITAL FOLLOW-UP VISIT  H[facility] Date of Admission: 03/27/20 Date of Discharge: 04/01/20 Admitted to inpatient rehab on 04/01/20 and discharged on 04/09/20  Transitions of care telephone call: 04/11/20 attempted twice by Kellie Simmering LPN  Reason for Admission: Stroke Primary (+Secondary) Diagnosis: Acute MCA CVA with additional infarcts, and small SAH, with residual deficits dysphagia, dysarthria, left sided weakness  - Hospital H&P and Discharge Summary have been reviewed - Patient presents today 7 days after recent rehab, and >15 days after recent hospitalization. Brief summary of recent course, patient had symptoms of fall overnight and weakness sudden overnight when woke up and difficulty speaking, went to hospital EMS took him to [facility], NIHSS 9, given IV tPA at that time, had imaging confirmation, occlusion L -ICA, IR attempted revascularization only partial and unsuccessful.  - Today reports overall has done fairly well after discharge. Symptoms of persistent fatigue and weakness have been persistent however. He is working with outpatient rehab currently upon discharge. - His upcoming apt with [facility] is not until 05/07/20 - He has had issues with significantly low BP 100s/60s by report recently, he has contacted the Rehab doctors and our office already and has concerns, he was advised to maintain BP goal >130-150 to avoid poor perfusion and repeat CVA. He was advised to increase fluid hydration nutrition and inc sodium to help maintain BP. He is not on BP medication and was not prior - Additionally he was advised to continue DAPT for period of time after hospital. They saw that the [facility] had improved on repeat CT 7/30. He was initiated on Aspirin  325mg  and Plavix 75mg  however they have NOT picked up the Aspirin 325 yet, were unaware of this.  Regarding nutrition and diet he was on dysphagia 3 diet, and had residual oropharyngeal dysphagia, has worked with SLP and overall has improved.  Weight loss, down 14 lbs in 1 month Poor PO in hospital, had dysphagia diet Now improving Now on Boost protein shake daily, with improved results and now taking in 3 meals daily  He doesn't eat as much fruit, now adding banana with breakfast Taking vitamins with fruit replacement for regular bowel movements.    - New medications on discharge: Plavix 75, ASA 325 - Changes to current meds on discharge: Rosuvastatin inc from 10 to 20mg , they have extra 10s left over   No new focal weakness, dizziness lightheadedness, chest pain dyspnea, passing out  I have reviewed the discharge medication list, and have reconciled the current and discharge medications today.   Current Outpatient Medications:  .  albuterol (VENTOLIN HFA) 108 (90 Base) MCG/ACT inhaler, Inhale 1-2 puffs into the lungs every 4 (four) hours as needed for wheezing or shortness of breath., Disp: 18 g, Rfl: 2 .  aspirin EC 325 MG EC tablet, Take 1 tablet (325 mg total) by mouth daily., Disp: 90 tablet, Rfl: 0 .  clopidogrel (PLAVIX) 75 MG tablet, Take 1 tablet (75 mg total) by mouth daily., Disp: 30 tablet, Rfl: 0 .  diclofenac Sodium (VOLTAREN) 1 % GEL, Apply 2 g topically 4 (four) times daily., Disp: 150 g, Rfl: 1 .  pantoprazole (PROTONIX) 40 MG tablet, Take 1 tablet (40 mg total) by  mouth at bedtime., Disp: 30 tablet, Rfl: 0 .  rosuvastatin (CRESTOR) 20 MG tablet, Take 1 tablet (20 mg total) by mouth daily., Disp: 30 tablet, Rfl: 0 .  Tiotropium Bromide-Olodaterol (STIOLTO RESPIMAT) 2.5-2.5 MCG/ACT AERS, Inhale 2 puffs into the lungs daily., Disp: 4 g, Rfl: 6 .  vitamin C (ASCORBIC ACID) 500 MG tablet, Take 500 mg by mouth daily. Am, Disp: , Rfl:    ------------------------------------------------------------------------- Social History   Tobacco Use  . Smoking status: Former Smoker    Packs/day: 1.50    Years: 49.00    Pack years: 73.50    Types: Cigarettes    Start date: 1970    Quit date: 09/14/2017    Years since quitting: 2.5  . Smokeless tobacco: Former Network engineer  . Vaping Use: Never used  Substance Use Topics  . Alcohol use: Yes    Comment: 6 pack on weekend  . Drug use: No    Review of Systems Per HPI unless specifically indicated above     Objective:    BP 90/60 (BP Location: Left Arm, Cuff Size: Normal)   Pulse 79   Temp (!) 97.5 F (36.4 C) (Temporal)   Resp 16   Ht 6' (1.829 m)   Wt 160 lb (72.6 kg)   SpO2 100%   BMI 21.70 kg/m   Wt Readings from Last 3 Encounters:  04/16/20 160 lb (72.6 kg)  04/08/20 165 lb 2 oz (74.9 kg)  03/27/20 174 lb (78.9 kg)     Physical Exam Vitals and nursing note reviewed.  Constitutional:      General: He is not in acute distress.    Appearance: He is well-developed. He is not diaphoretic.     Comments: Well-appearing, comfortable, cooperative, appears thin  HENT:     Head: Normocephalic and atraumatic.  Eyes:     General:        Right eye: No discharge.        Left eye: No discharge.     Conjunctiva/sclera: Conjunctivae normal.  Neck:     Thyroid: No thyromegaly.  Cardiovascular:     Rate and Rhythm: Normal rate and regular rhythm.     Heart sounds: Normal heart sounds. No murmur heard.   Pulmonary:     Effort: Pulmonary effort is normal. No respiratory distress.     Breath sounds: Normal breath sounds. No wheezing or rales.  Musculoskeletal:        General: Normal range of motion.     Cervical back: Normal range of motion and neck supple.     Right lower leg: No edema.     Left lower leg: No edema.     Comments: Bilateral upper ext and lower extremity otherwise intact muscle strength 5/5  Lymphadenopathy:     Cervical: No cervical  adenopathy.  Skin:    General: Skin is warm and dry.     Findings: No erythema or rash.  Neurological:     Mental Status: He is alert and oriented to person, place, and time.     Cranial Nerves: No cranial nerve deficit.     Sensory: No sensory deficit.     Motor: Weakness (left hand grip 4/5 - improved since discharge) present.     Comments: Slight dysarthria but improved  Psychiatric:        Behavior: Behavior normal.     Comments: Well groomed, good eye contact, normal speech and thoughts       ECHOCARDIOGRAM REPORT  Patient Name:  DONNAVIN VANDENBRINK Hawbaker Date of Exam: 03/27/2020  Medical Rec #: [id number]   Height:    72.0 in  Accession #:  [id number]   Weight:    174.0 lb  Date of Birth: 08-17-1956   BSA:     2.008 m  Patient Age:  10 years    BP:      121/96 mmHg  Patient Gender: M       HR:      75 bpm.  Exam Location: Inpatient   Procedure: 2D Echo   Indications:  Stroke 434.91 / I163.9    History:    Patient has no prior history of Echocardiogram  examinations.         COPD; Risk Factors:Current Smoker. R MCA revascularization  this         morning.    Sonographer:  Darlina Sicilian RDCS  Referring Phys: [phone] MCNEILL P KIRKPATRICK     Sonographer Comments: Suboptimal parasternal window, suboptimal apical  window and echo performed with patient supine and on artificial  respirator.  IMPRESSIONS    1. Technically difficult study with limited views. Left ventricular  ejection fraction, by estimation, is grossly 55 to 60%. The left ventricle  has normal function. Left ventricular diastolic parameters were normal.  2. Right ventricular systolic function is normal. The right ventricular  size is normal. Tricuspid regurgitation signal is inadequate for assessing  PA pressure.  3. The mitral valve is normal in structure. No evidence of mitral valve  regurgitation.  4. The aortic valve  was not well visualized. Aortic valve regurgitation  is not visualized. No aortic stenosis is present.   FINDINGS  Left Ventricle: Left ventricular ejection fraction, by estimation, is 55  to 60%. The left ventricle has normal function. The left ventricle has no  regional wall motion abnormalities. The left ventricular internal cavity  size was normal in size. There is  no left ventricular hypertrophy. Left ventricular diastolic parameters  were normal.   Right Ventricle: The right ventricular size is normal. No increase in  right ventricular wall thickness. Right ventricular systolic function is  normal. Tricuspid regurgitation signal is inadequate for assessing PA  pressure.   Left Atrium: Left atrial size was normal in size.   Right Atrium: Right atrial size was normal in size.   Pericardium: There is no evidence of pericardial effusion.   Mitral Valve: The mitral valve is normal in structure. No evidence of  mitral valve regurgitation.   Tricuspid Valve: The tricuspid valve is normal in structure. Tricuspid  valve regurgitation is trivial.   Aortic Valve: The aortic valve was not well visualized. Aortic valve  regurgitation is not visualized. No aortic stenosis is present.   Pulmonic Valve: The pulmonic valve was not well visualized. Pulmonic valve  regurgitation is not visualized.   Aorta: The aortic root is normal in size and structure.   IAS/Shunts: The interatrial septum was not well visualized.     LEFT VENTRICLE  PLAX 2D  LVIDd:     4.40 cm Diastology  LVIDs:     3.50 cm LV e' lateral:  8.70 cm/s  LV PW:     0.70 cm LV E/e' lateral: 6.5  LV IVS:    0.80 cm LV e' medial:  8.70 cm/s  LVOT diam:   2.30 cm LV E/e' medial: 6.5  LV SV:     54  LV SV Index:  27  LVOT Area:   4.15 cm  LEFT ATRIUM       Index  LA diam:    2.50 cm 1.24 cm/m  LA Vol (A2C):  88.7 ml 44.17 ml/m  LA Vol (A4C):  52.4 ml 26.09  ml/m  LA Biplane Vol: 66.4 ml 33.07 ml/m  AORTIC VALVE  LVOT Vmax:  52.70 cm/s  LVOT Vmean: 35.600 cm/s  LVOT VTI:  0.131 m    AORTA  Ao Root diam: 3.30 cm   MITRAL VALVE  MV Area (PHT): 2.62 cm  SHUNTS  MV Decel Time: 289 msec  Systemic VTI: 0.13 m  MV E velocity: 56.60 cm/s Systemic Diam: 2.30 cm  MV A velocity: 39.80 cm/s  MV E/A ratio: 1.42   Oswaldo Milian MD  Electronically signed by Oswaldo Milian MD  Signature Date/Time: 03/27/2020/2:43:13 PM    Final    ----------------------------------  04/04/20  CLINICAL DATA:  Follow-up stroke  EXAM: CT HEAD WITHOUT CONTRAST  TECHNIQUE: Contiguous axial images were obtained from the base of the skull through the vertex without intravenous contrast.  COMPARISON:  03/28/2020  FINDINGS: Brain: Again noted are changes consistent with the given clinical history of right middle cerebral artery infarct degree of subarachnoid hemorrhage has nearly completely resolved when compared with the prior exam. No new focal hemorrhage or infarct is seen. No extra-axial fluid collection is noted.  Vascular: No hyperdense vessel or unexpected calcification.  Skull: Normal. Negative for fracture or focal lesion.  Sinuses/Orbits: No acute finding.  Other: None.  IMPRESSION: Right middle cerebral artery infarct with subarachnoid hemorrhage although the degree of hemorrhage has improved significantly in the interval from the prior exam.  No new focal abnormality is seen.   Electronically Signed   By: Inez Catalina M.D.   On: 04/04/2020 02:19   Results for orders placed or performed during the hospital encounter of 04/01/20  CBC WITH DIFFERENTIAL  Result Value Ref Range   WBC 10.2 4.0 - 10.5 K/uL   RBC 4.51 4.22 - 5.81 MIL/uL   Hemoglobin 14.8 13.0 - 17.0 g/dL   HCT 42.1 39 - 52 %   MCV 93.3 80.0 - 100.0 fL   MCH 32.8 26.0 - 34.0 pg   MCHC 35.2 30.0 - 36.0 g/dL   RDW 12.7 11.5 -  15.5 %   Platelets 272 150 - 400 K/uL   nRBC 0.0 0.0 - 0.2 %   Neutrophils Relative % 66 %   Neutro Abs 6.8 1.7 - 7.7 K/uL   Lymphocytes Relative 21 %   Lymphs Abs 2.1 0.7 - 4.0 K/uL   Monocytes Relative 7 %   Monocytes Absolute 0.7 0 - 1 K/uL   Eosinophils Relative 4 %   Eosinophils Absolute 0.4 0 - 0 K/uL   Basophils Relative 1 %   Basophils Absolute 0.1 0 - 0 K/uL   Immature Granulocytes 1 %   Abs Immature Granulocytes 0.07 0.00 - 0.07 K/uL  Comprehensive metabolic panel  Result Value Ref Range   Sodium 140 135 - 145 mmol/L   Potassium 3.6 3.5 - 5.1 mmol/L   Chloride 110 98 - 111 mmol/L   CO2 22 22 - 32 mmol/L   Glucose, Bld 100 (H) 70 - 99 mg/dL   BUN 19 8 - 23 mg/dL   Creatinine, Ser 0.85 0.61 - 1.24 mg/dL   Calcium 9.3 8.9 - 10.3 mg/dL   Total Protein 5.8 (L) 6.5 - 8.1 g/dL   Albumin 3.0 (L) 3.5 - 5.0 g/dL   AST 24 15 - 41  U/L   ALT 26 0 - 44 U/L   Alkaline Phosphatase 68 38 - 126 U/L   Total Bilirubin 1.7 (H) 0.3 - 1.2 mg/dL   GFR calc non Af Amer >60 >60 mL/min   GFR calc Af Amer >60 >60 mL/min   Anion gap 8 5 - 15  Glucose, capillary  Result Value Ref Range   Glucose-Capillary 99 70 - 99 mg/dL  Glucose, capillary  Result Value Ref Range   Glucose-Capillary 94 70 - 99 mg/dL  Glucose, capillary  Result Value Ref Range   Glucose-Capillary 92 70 - 99 mg/dL  Glucose, capillary  Result Value Ref Range   Glucose-Capillary 93 70 - 99 mg/dL  Glucose, capillary  Result Value Ref Range   Glucose-Capillary 106 (H) 70 - 99 mg/dL  Glucose, capillary  Result Value Ref Range   Glucose-Capillary 93 70 - 99 mg/dL  Glucose, capillary  Result Value Ref Range   Glucose-Capillary 91 70 - 99 mg/dL  Glucose, capillary  Result Value Ref Range   Glucose-Capillary 81 70 - 99 mg/dL  Glucose, capillary  Result Value Ref Range   Glucose-Capillary 96 70 - 99 mg/dL  Glucose, capillary  Result Value Ref Range   Glucose-Capillary 85 70 - 99 mg/dL  Glucose, capillary  Result  Value Ref Range   Glucose-Capillary 87 70 - 99 mg/dL  Basic metabolic panel  Result Value Ref Range   Sodium 141 135 - 145 mmol/L   Potassium 3.7 3.5 - 5.1 mmol/L   Chloride 108 98 - 111 mmol/L   CO2 24 22 - 32 mmol/L   Glucose, Bld 98 70 - 99 mg/dL   BUN 19 8 - 23 mg/dL   Creatinine, Ser 1.10 0.61 - 1.24 mg/dL   Calcium 9.0 8.9 - 10.3 mg/dL   GFR calc non Af Amer >60 >60 mL/min   GFR calc Af Amer >60 >60 mL/min   Anion gap 9 5 - 15  CBC  Result Value Ref Range   WBC 12.3 (H) 4.0 - 10.5 K/uL   RBC 4.17 (L) 4.22 - 5.81 MIL/uL   Hemoglobin 13.7 13.0 - 17.0 g/dL   HCT 40.0 39 - 52 %   MCV 95.9 80.0 - 100.0 fL   MCH 32.9 26.0 - 34.0 pg   MCHC 34.3 30.0 - 36.0 g/dL   RDW 12.9 11.5 - 15.5 %   Platelets 311 150 - 400 K/uL   nRBC 0.0 0.0 - 0.2 %      Assessment & Plan:   Problem List Items Addressed This Visit    ICAO (internal carotid artery occlusion), left   History of cerebrovascular accident (CVA) with residual deficit - Primary   Hemiplegia of dominant side following cerebrovascular accident (CVA) ([place])   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)   Arterial hypotension     Follow-up today after hospitalization / Inpatient rehab  Recent new 1st episode acute MCA stroke, and other infarct with residual deficits - dysphagia, dysarthria and hemiplegia  L - ICA occlusion, unable to be revascularized by IR, only partial Risk of hypoperfusion as a result, given BP parameters of SBP >130-150 He was not Hypertensive and does not take HTN medication  He has gradually improved with weakness, dysarthria and dysphagia  Improved and advanced his diet, and has had some weight loss still now hopefully wt gain with improved PO intake  Advised him to double his Boost dose twice a day and 3 meals day  He  should continue Rosuvastatin 20mg  (he can double the 10mg  if need)  He was placed on DAPT for secondary stroke risk reduction, on ASA 325mg  + Plavix  75mg , however he did not start ASA on discharge since it was OTC they did not pick up this med yet.  Now concerning Hypotension, with persistent fatigue but no other acute hemodynamic symptoms or other concerning features at this time.  BP today repeat measures 80-90 / 50s-60  I have urgently coordinated his care and called his [facility] office, spoke with [name] NP regarding this case, she will see him as scheduled on 05/07/20 for HFU apt, however she is concerned and advised me to reach out to Cardiology more urgently for his Hypotension.  I spoke with [facility] Cardiology [place] - Dr Harrell Gave and she recommended hospital ED visit today immediately for BP management to raise his BP may need IV fluids and or medications. She did not feel urgent outpatient referral or midodrine would resolve this promptly enough given his pressure and parameters  Again I emphasized the previous recommendations from hospital that were given to patient to improve hydration, nutrition and some higher sodium for now.  I spoke with [facility] Nurse Triage in ED - and reviewed case. Advised likely Cardiology / neuro would need to consult on him.  He will go to [facility] via personal vehicle right now for further management of the BP.    No orders of the defined types were placed in this encounter.   Follow up plan: Return if symptoms worsen or fail to improve.  A total of 45 minutes was spent face-to-face with this patient. Greater than 50% of this time (approximately 15 minutes) was spent in counseling and coordination of care with the patient, regarding his concerning hypotension today after stroke recently. I have coordinated his care and called both his Neurologist and a local Cardiologist to discuss his case and advise patient further on recommendations, as documented above.   [name], [facility] 04/16/2020, 11:07 AM

## 2020-04-16 NOTE — ED Triage Notes (Signed)
Sent to ED for eval of hypotension. Pt states he was at outpt therapy today and found to have bp of 80 systolic. Pt recently discharged after stroke. Pt is alert and oriented. Only residual from stroke is left hand which pt states is not changed. Pt speaks in full clear sentences. No HA. Pupils equal. BP in triage 108/78 in right arm. Appears in nad.

## 2020-04-16 NOTE — Patient Instructions (Addendum)
Thank you for coming to the office today.  START Aspirin 325mg  (enteric coated) once DAILY.  CONTINUE other meds ordered from hospital including Plavix (Clopidogrel) 75mg  daily.  The Neurologist will discuss these meds in September, they are both thinning the blood to prevent future recurrence of stroke. Eventually they may adjust the dose of one or both or only take one  I spoke with Dr Harrell Gave with [facility] and they advised that you go directly to the [facility] ED for management of BP and hypotension, they can consult the cardiology service there if needed. They do not think the medicine is strong enough to immediately help your BP. And it is too low.  Please schedule a Follow-up Appointment to: Return if symptoms worsen or fail to improve.  If you have any other questions or concerns, please feel free to call the office or send a message through [place]. You may also schedule an earlier appointment if necessary.  Additionally, you may be receiving a survey about your experience at our office within a few days to 1 week by e-mail or mail. We value your feedback.  [name], DO [place]

## 2020-04-17 ENCOUNTER — Telehealth: Payer: Self-pay | Admitting: Family Medicine

## 2020-04-17 DIAGNOSIS — I9589 Other hypotension: Secondary | ICD-10-CM

## 2020-04-17 DIAGNOSIS — I6522 Occlusion and stenosis of left carotid artery: Secondary | ICD-10-CM

## 2020-04-17 DIAGNOSIS — I6930 Unspecified sequelae of cerebral infarction: Secondary | ICD-10-CM

## 2020-04-17 NOTE — Telephone Encounter (Signed)
I am following up on this patient after he was seen yesterday 04/16/20 by me in our office for HFU. He was sent by [place] directly to [facility] ED due to hypotension 80/50s BP in office, after we spoke directly to his Neurologist and to a Cardiologist on call here locally. See below for details.  I spoke with his wife [name] today, and checked on him. He ended up waiting for 8-10 hours at ED and was not seen. He had preliminary BP reading at 110 SBP and basic labs EKG but due to understaffing and high volume patients - the patient decided to leave.  He has improved nutrition and hydration. No new worsening concerns. BP still 100-115 range.  I advised now that I will go ahead with Urgent outpatient Cardiology referral.  He has PT apt tomorrow.  ---------------------------------------------  Reason for Referral: Urgent referral to Cardiology for Hypotension in setting of post-CVA (R MCA) on 03/27/20. Patient has L ICA occlusion only partial re-vascularization achieved, has been advised by Neurology in hospital to maintain BP of >130 to 150, to maintain perfusion. He does not have history of HTN and is not on any HTN medications currently. He has had complications post-CVA with poor nutrition / dysphagia and weight loss. He has attempted to maintain nutrition / hydration / sodium to raise BP but limited success so far. He has completed inpatient rehab. Now at home. I saw him in office for HFU apt on 04/16/20, he had severe low BP 80-90/50s but relatively asymptomatic. I spoke with Dr Buford Dresser Cardiology at that time of his visit and she recommended Hospital ED for evaluation. Patient went to [facility] but due to extremely long wait  >12 hours, he had BP and initial labs / EKG done and left without being seen. His BP has maintained 100-115 approximately. His [facility] Neurologist in [place] will see him for first post-CVA visit on 05/07/20, and they have recommended Cardiology consultation for his  hypotension.  Has the referral been discussed with the patient?: Yes  Designated contact for the referral if not the patient (name/phone number): [name] (wife) [phone] (M)  Has the patient seen a specialist for this issue before?: No  If so, who (practice/provider)?  Does the patient have a provider or location preference for the referral?: [place] Would the patient like to see previous specialist if applicable?  [name], [facility] 04/17/2020, 1:03 PM

## 2020-04-18 ENCOUNTER — Encounter: Payer: Medicare HMO | Admitting: Physical Medicine & Rehabilitation

## 2020-04-18 ENCOUNTER — Encounter: Payer: Medicare HMO | Attending: Physical Medicine & Rehabilitation | Admitting: Registered Nurse

## 2020-04-18 ENCOUNTER — Ambulatory Visit: Payer: Medicare HMO | Admitting: Speech Pathology

## 2020-04-18 ENCOUNTER — Other Ambulatory Visit: Payer: Self-pay

## 2020-04-18 ENCOUNTER — Encounter: Payer: Self-pay | Admitting: Registered Nurse

## 2020-04-18 VITALS — BP 101/69 | HR 72 | Temp 98.0°F | Ht 72.0 in | Wt 161.0 lb

## 2020-04-18 DIAGNOSIS — I609 Nontraumatic subarachnoid hemorrhage, unspecified: Secondary | ICD-10-CM | POA: Diagnosis not present

## 2020-04-18 DIAGNOSIS — I6601 Occlusion and stenosis of right middle cerebral artery: Secondary | ICD-10-CM

## 2020-04-18 NOTE — Chronic Care Management (AMB) (Signed)
  Care Management   Note  04/18/2020 Name: Ethan Fitzgerald MRN: [id number] DOB: Aug 08, 1956  Ethan Fitzgerald is a 64 y.o. year old male who is a primary care patient of Olin Hauser, DO and is actively engaged with the care management team. I reached out to Janene Harvey by phone today to assist with re-scheduling a follow up visit with the Pharmacist  Follow up plan: Unsuccessful telephone outreach attempt made. A HIPPA compliant phone message was left for the patient providing contact information and requesting a return call.  The care management team will reach out to the patient again over the next 5 days.  If patient returns call to provider office, please advise to call [facility]  at [place], [place], [place], [place] [address] Direct Dial: [phone] [email or web address]: [email or web address]

## 2020-04-18 NOTE — Progress Notes (Signed)
Subjective:    [name], male    DOB: 18-Dec-1955, 64 y.o.   MRN: [id number]  HPI: Ethan Fitzgerald is a 64 y.o. male who is here for Hospital follow up appointment of his Acute Right MCA Stroke and SAH.  On 03/27/2020 Ethan Fitzgerald awaken with left sided weakness, EMS was called and he arrived to [facility] with code stroke activated.  . CT Head Code Stroke:  IMPRESSION: 1. Dense right M1 segment, concerning for thrombus/LVO. Subtle asymmetric hypodensity involving the right caudate and lentiform nuclei concerning for early ischemic changes. No intracranial Hemorrhage. CTA Head W/WO Contrast:  IMPRESSION: 1. Positive CTA for LVO, with abrupt occlusion of the right ICA at its origin at the right bifurcation. Distal reconstitution at the cavernous segment with subsequent reocclusion at the proximal right M1 segment. Moderately robust collateralization evident within the right MCA territory. 2. Abrupt occlusion of the left ICA at its origin at the left bifurcation, age indeterminate. Distal reconstitution at the cavernous segment with good perfusion of the left MCA and both ACAs. Superimposed short-segment severe cavernous left ICA stenosis as above. 3. Wide patency of the posterior circulation. Right vertebral artery dominant. 4.  Emphysema (ICD10-J43.9).  MRI Brain:  ADDENDUM: Findings of subarachnoid hemorrhage and right MCA acute infarct called by telephone at the time of interpretation on 03/27/2020 at 4:06 pm to provider Dr. Leonie Man, who verbally acknowledged these results. Neurology Consulted.   Ethan Fitzgerald Received TPA on 03/27/2020 per Dr Erlinda Hong note:  ReceivedtPA 03/27/2020 at [place].  Stroke: R MCA and multiple R and L anterior circulation infarcts s/p tPA and IR w/ partial revascularization with resultant small SAH.  Ethan Fitzgerald was admitted to inpatient Rehabilitation on 04/01/2020 and discharged home on 04/09/2020. He is receiving outpatient therapy at  [facility]. He denies any pain. He rates his pain 0. Also reports he has a good appetite.   Ethan Fitzgerald voiced concerned related to his hypotension, discharge summaries, PCP and ED notes was reviewed. Since his discharged he has spoken to his PCP, spoke with Reesa Chew PA-C and was evaluated in the emergency room. He has an appointment with Cardiology on 04/21/2020. He denies dizziness and weakness. Blood pressure 99/65 upon arrival to office, was re-checked his blood pressure 101/69, he was instructed to keep a blood pressure log, they verbalize understanding.    Pain Inventory Average Pain 0 Pain Right Now 0 My pain is no pain  LOCATION OF PAIN  no pain  BOWEL Number of stools per week: 2 Oral laxative use na Type of laxative  Enema or suppository use na History of colostomy na Incontinent na  BLADDER Normal In and out cath, frequency na Able to self cath No  Bladder incontinence No  Frequent urination No  Leakage with coughing No  Difficulty starting stream No  Incomplete bladder emptying No    Mobility walk without assistance ability to climb steps?  yes do you drive?  no  Function not employed: date last employed 04/08/20 disabled: date disabled 04/08/20  Neuro/Psych weakness  Prior Studies Any changes since last visit?  no  Physicians involved in your care Any changes since last visit?  no Primary care [place] Neurologist sethi/ [name] NP   Family History  Problem Relation Age of Onset  . Diabetes Mother   . Heart attack Mother 65  . Cancer Father        liver   . COPD Brother   .  HIV/AIDS Brother   . Prostate cancer Neg Hx   . Colon cancer Neg Hx    Social History   Socioeconomic History  . Marital status: Married    Spouse name: Not on file  . Number of children: Not on file  . Years of education: Not on file  . Highest education level: Not on file  Occupational History  . Not on file  Tobacco Use  . Smoking status: Former  Smoker    Packs/day: 1.50    Years: 49.00    Pack years: 73.50    Types: Cigarettes    Start date: 1970    Quit date: 09/14/2017    Years since quitting: 2.5  . Smokeless tobacco: Former Network engineer  . Vaping Use: Never used  Substance and Sexual Activity  . Alcohol use: Yes    Comment: 6 pack on weekend  . Drug use: No  . Sexual activity: Not on file  Other Topics Concern  . Not on file  Social History Narrative  . Not on file   Social Determinants of Health   Financial Resource Strain:   . Difficulty of Paying Living Expenses:   Food Insecurity:   . Worried About Charity fundraiser in the Last Year:   . Arboriculturist in the Last Year:   Transportation Needs:   . Film/video editor (Medical):   Marland Kitchen Lack of Transportation (Non-Medical):   Physical Activity:   . Days of Exercise per Week:   . Minutes of Exercise per Session:   Stress:   . Feeling of Stress :   Social Connections:   . Frequency of Communication with Friends and Family:   . Frequency of Social Gatherings with Friends and Family:   . Attends Religious Services:   . Active Member of Clubs or Organizations:   . Attends Archivist Meetings:   Marland Kitchen Marital Status:    Past Surgical History:  Procedure Laterality Date  . BACK SURGERY     metal plate in back  . COLONOSCOPY    . COLONOSCOPY WITH PROPOFOL N/A 04/26/2019   Procedure: COLONOSCOPY WITH PROPOFOL;  Surgeon: Jonathon Bellows, MD;  Location[facility] ENDOSCOPY;  Service: Gastroenterology;  Laterality: N/A;  . HERNIA REPAIR Right   . IR ANGIO INTRA EXTRACRAN SEL COM CAROTID INNOMINATE UNI L MOD SED  03/27/2020  . IR ANGIO VERTEBRAL SEL SUBCLAVIAN INNOMINATE UNI L MOD SED  03/27/2020  . IR ANGIO VERTEBRAL SEL VERTEBRAL UNI R MOD SED  03/27/2020  . IR CT HEAD LTD  03/27/2020  . IR PERCUTANEOUS ART THROMBECTOMY/INFUSION INTRACRANIAL INC DIAG ANGIO  03/27/2020  . KNEE ARTHROSCOPY Left 03/28/2015   Procedure: Arthroscopic partial medial meniscectomy  plus chondral debridement;  Surgeon: Leanor Kail, MD;  Location: [place];  Service: Orthopedics;  Laterality: Left;  . RADIOLOGY WITH ANESTHESIA N/A 03/27/2020   Procedure: IR WITH ANESTHESIA;  Surgeon: Luanne Bras, MD;  Location: [place];  Service: Radiology;  Laterality: N/A;   Past Medical History:  Diagnosis Date  . COPD (chronic obstructive pulmonary disease) (HCC)    mild  . COVID-19 09/2019  . Erectile dysfunction   . Headache    daily, stress headaches  . Knee pain, left   . Seasonal allergies   . Tobacco dependence    BP 99/65   Pulse 77   Temp 98 F (36.7 C)   Ht 6' (1.829 m)   Wt 161 lb (73 kg)  SpO2 99%   BMI 21.84 kg/m   Opioid Risk Score:   Fall Risk Score:  `1  Depression screen PHQ 2/9  Depression screen [facility] 2/9 04/18/2020 12/27/2019 11/05/2019 05/08/2019 04/03/2019 10/02/2018 06/22/2018  Decreased Interest 3 0 0 0 0 0 0  Down, Depressed, Hopeless 3 0 0 0 0 0 0  PHQ - 2 Score 6 0 0 0 0 0 0  Altered sleeping 3 - - - - - -  Tired, decreased energy 0 - - - - - -  Change in appetite 0 - - - - - -  Feeling bad or failure about yourself  1 - - - - - -  Trouble concentrating 0 - - - - - -  Moving slowly or fidgety/restless 0 - - - - - -  Suicidal thoughts 0 - - - - - -  PHQ-9 Score 10 - - - - - -  Difficult doing work/chores Extremely dIfficult - - - - - -     Review of Systems  Constitutional: Positive for appetite change and unexpected weight change.  HENT: Negative.   Eyes: Negative.   Respiratory: Negative.   Cardiovascular: Negative.   Gastrointestinal: Negative.   Endocrine: Negative.   Genitourinary: Negative.   Musculoskeletal: Negative.   Skin: Negative.   Allergic/Immunologic: Negative.   Neurological: Positive for weakness.  Hematological: Bruises/bleeds easily.       Plavix  Psychiatric/Behavioral: Positive for dysphoric mood.  All other systems reviewed and are negative.      Objective:   Physical Exam Vitals and  nursing note reviewed.  Constitutional:      Appearance: Normal appearance.  Cardiovascular:     Rate and Rhythm: Normal rate and regular rhythm.     Pulses: Normal pulses.     Heart sounds: Normal heart sounds.  Pulmonary:     Effort: Pulmonary effort is normal.     Breath sounds: Normal breath sounds.  Musculoskeletal:     Cervical back: Normal range of motion and neck supple.     Comments: Normal Muscle Bulk and Muscle Testing Reveals:  Upper Extremities: Full ROM and Muscle Strength 5/5  Lower Extremities: Full ROM and Muscle Strength 5/5 Arises from Table with Ease Narrow Based  Gait   Skin:    General: Skin is warm and dry.  Neurological:     Mental Status: He is alert and oriented to person, place, and time.  Psychiatric:        Mood and Affect: Mood normal.        Behavior: Behavior normal.           Assessment & Plan:  1.  Acute Right MCA Stroke and SAH:He has a scheduled appointment with Neurology. Continue outpatient Therapy as directed by the Therapist.  2. Hypotension: He has a scheduled appointment with Cardiology on 04/21/2020.   20 minutes of face to face patient care time was spent during this visit. All questions were encouraged and answered.  F/U with Dr Letta Pate in 4-6 weeks

## 2020-04-21 ENCOUNTER — Ambulatory Visit: Payer: Medicare HMO | Admitting: Cardiovascular Disease

## 2020-04-21 ENCOUNTER — Encounter: Payer: Self-pay | Admitting: Cardiovascular Disease

## 2020-04-21 ENCOUNTER — Other Ambulatory Visit: Payer: Self-pay

## 2020-04-21 VITALS — BP 90/60 | HR 63 | Ht 72.0 in | Wt 161.4 lb

## 2020-04-21 DIAGNOSIS — I63511 Cerebral infarction due to unspecified occlusion or stenosis of right middle cerebral artery: Secondary | ICD-10-CM | POA: Diagnosis not present

## 2020-04-21 DIAGNOSIS — I609 Nontraumatic subarachnoid hemorrhage, unspecified: Secondary | ICD-10-CM | POA: Diagnosis not present

## 2020-04-21 DIAGNOSIS — I739 Peripheral vascular disease, unspecified: Secondary | ICD-10-CM | POA: Diagnosis not present

## 2020-04-21 DIAGNOSIS — G903 Multi-system degeneration of the autonomic nervous system: Secondary | ICD-10-CM

## 2020-04-21 MED ORDER — FLUDROCORTISONE ACETATE 0.1 MG PO TABS: 0.1000 mg | ORAL_TABLET | Freq: Every day | ORAL | 3 refills | Status: DC

## 2020-04-21 NOTE — Patient Instructions (Signed)
Medication Instructions:  Please start florinef every other day Monitor blood pressure Goal pressure >110 We may need to take the pill daily  If you need a refill on your cardiac medications before your next appointment, please call your pharmacy.    Lab work: No new labs needed   If you have labs (blood work) drawn today and your tests are completely normal, you will receive your results only by: Marland Kitchen MyChart Message (if you have MyChart) OR . A paper copy in the mail If you have any lab test that is abnormal or we need to change your treatment, we will call you to review the results.   Testing/Procedures: No new testing needed   Follow-Up: At [facility], you and your health needs are our priority.  As part of our continuing mission to provide you with exceptional heart care, we have created designated Provider Care Teams.  These Care Teams include your primary Cardiologist (physician) and Advanced Practice Providers (APPs -  Physician Assistants and Nurse Practitioners) who all work together to provide you with the care you need, when you need it.  . You will need a follow up appointment as needed   . Providers on your designated Care Team:   . Murray Hodgkins, NP . Christell Faith, PA-C . Marrianne Mood, PA-C  Any Other Special Instructions Will Be Listed Below (If Applicable).  COVID-19 Vaccine Information can be found at: [email or web address].uk For questions related to vaccine distribution or appointments, please email vaccine@[email or web address] or call [phone].

## 2020-04-21 NOTE — Progress Notes (Signed)
Cardiology Office Note  Date:  04/21/2020   ID:  Ethan Fitzgerald, DOB August 03, 1956, MRN [id number]  PCP:  Olin Hauser, DO   Chief Complaint  Patient presents with  . New Patient (Initial Visit)    Ref by Dr. Parks Ranger for hypotension with a Hx. of CVA. Meds reviewed by the pt. verbally.     HPI:  Ethan Fitzgerald is a 64 year old gentleman with past medical history of Smoker  PAD, bilateral ICA occlusion History of stroke Who presents by referral from Dr. Parks Ranger for hypotension  Hospital Notes reviewed March 27, 2020 woke with left-sided weakness, EMS called, code stroke activated Taken to [facility]  CT scan showing 1. Dense right M1 segment, concerning for thrombus/LVO. Subtle asymmetric hypodensity involving the right caudate and lentiform nuclei concerning for early ischemic changes. No intracranial Hemorrhage.  CTA Head W/WO Contrast:  R MCA and multiple R and L anterior circulation infarcts s/p tPA and IR w/ partial revascularizationwith resultant small SAH.  Imaging confirming occlusion bilateral ICA  He received TPA and underwent cerebral angio with partial revascularization of right-MCA dominant division with unsuccessful attempts at revascularization of proximal right ICA.  Blood pressure goals recommended 130- 150 due to intracranial stenosis  Ethan Fitzgerald was admitted to inpatient Rehabilitation on 04/01/2020 and discharged home on 04/09/2020. No mention of low blood pressure during his rehab  In follow-up today reports blood pressure has been running low,, if he stands up too quickly will get lightheaded He has had tremendous weight loss over the past year or so, 25 pounds Previous heavy alcohol, none recently Has not smoked in over a year Now eating more, drinking boost, trying to get his weight back up  Residual weakness in his left hand  EKG personally reviewed by myself on todays visit Shows normal sinus rhythm with rate 63 bpm no significant ST or T  wave changes   PMH:   has a past medical history of COPD (chronic obstructive pulmonary disease) ([place]), COVID-19 (09/2019), Erectile dysfunction, Headache, Knee pain, left, Seasonal allergies, and Tobacco dependence.  PSH:    Past Surgical History:  Procedure Laterality Date  . BACK SURGERY     metal plate in back  . COLONOSCOPY    . COLONOSCOPY WITH PROPOFOL N/A 04/26/2019   Procedure: COLONOSCOPY WITH PROPOFOL;  Surgeon: Jonathon Bellows, MD;  Location: [facility] ENDOSCOPY;  Service: Gastroenterology;  Laterality: N/A;  . HERNIA REPAIR Right   . IR ANGIO INTRA EXTRACRAN SEL COM CAROTID INNOMINATE UNI L MOD SED  03/27/2020  . IR ANGIO VERTEBRAL SEL SUBCLAVIAN INNOMINATE UNI L MOD SED  03/27/2020  . IR ANGIO VERTEBRAL SEL VERTEBRAL UNI R MOD SED  03/27/2020  . IR CT HEAD LTD  03/27/2020  . IR PERCUTANEOUS ART THROMBECTOMY/INFUSION INTRACRANIAL INC DIAG ANGIO  03/27/2020  . KNEE ARTHROSCOPY Left 03/28/2015   Procedure: Arthroscopic partial medial meniscectomy plus chondral debridement;  Surgeon: Leanor Kail, MD;  Location: [place];  Service: Orthopedics;  Laterality: Left;  . RADIOLOGY WITH ANESTHESIA N/A 03/27/2020   Procedure: IR WITH ANESTHESIA;  Surgeon: Luanne Bras, MD;  Location: [place];  Service: Radiology;  Laterality: N/A;    Current Outpatient Medications  Medication Sig Dispense Refill  . albuterol (VENTOLIN HFA) 108 (90 Base) MCG/ACT inhaler Inhale 1-2 puffs into the lungs every 4 (four) hours as needed for wheezing or shortness of breath. 18 g 2  . aspirin EC 325 MG EC tablet Take 1 tablet (325 mg total) by mouth daily. Simpson  tablet 0  . clopidogrel (PLAVIX) 75 MG tablet Take 1 tablet (75 mg total) by mouth daily. 30 tablet 0  . diclofenac Sodium (VOLTAREN) 1 % GEL Apply 2 g topically 4 (four) times daily. 150 g 1  . pantoprazole (PROTONIX) 40 MG tablet Take 1 tablet (40 mg total) by mouth at bedtime. 30 tablet 0  . rosuvastatin (CRESTOR) 20 MG tablet Take 1 tablet (20 mg  total) by mouth daily. 30 tablet 0  . Tiotropium Bromide-Olodaterol (STIOLTO RESPIMAT) 2.5-2.5 MCG/ACT AERS Inhale 2 puffs into the lungs daily. 4 g 6  . vitamin C (ASCORBIC ACID) 500 MG tablet Take 500 mg by mouth daily. Am    . fludrocortisone (FLORINEF) 0.1 MG tablet Take 1 tablet (0.1 mg total) by mouth daily. 30 tablet 3   No current facility-administered medications for this visit.     Allergies:   Patient has no known allergies.   Social History:  The patient  reports that he quit smoking about 2 years ago. His smoking use included cigarettes. He started smoking about 51 years ago. He has a 73.50 pack-year smoking history. He has quit using smokeless tobacco. He reports current alcohol use. He reports that he does not use drugs.   Family History:   family history includes COPD in his brother; Cancer in his father; Diabetes in his mother; HIV/AIDS in his brother; Heart attack (age of onset: 15) in his mother.    Review of Systems: Review of Systems  Constitutional: Negative.   HENT: Negative.   Respiratory: Negative.   Cardiovascular: Negative.   Gastrointestinal: Negative.   Musculoskeletal: Negative.   Neurological: Negative.   Psychiatric/Behavioral: Negative.   All other systems reviewed and are negative.   PHYSICAL EXAM: VS:  BP 90/60 (BP Location: Right Arm, Patient Position: Sitting, Cuff Size: Normal)   Pulse 63   Ht 6' (1.829 m)   Wt 161 lb 6 oz (73.2 kg)   SpO2 98%   BMI 21.89 kg/m  , BMI Body mass index is 21.89 kg/m. GEN: Well nourished, well developed, in no acute distress HEENT: normal Neck: no JVD, carotid bruits, or masses Cardiac: RRR; no murmurs, rubs, or gallops,no edema  Respiratory:  clear to auscultation bilaterally, normal work of breathing GI: soft, nontender, nondistended, + BS MS: no deformity or atrophy Skin: warm and dry, no rash Neuro:  Strength and sensation are intact Psych: euthymic mood, full affect  Recent Labs: 04/02/2020: ALT  26 04/16/2020: BUN 23; Creatinine, Ser 1.03; Hemoglobin 15.7; Platelets 337; Potassium 4.3; Sodium 140    Lipid Panel Lab Results  Component Value Date   CHOL 153 03/27/2020   HDL 48 03/27/2020   LDLCALC 82 03/27/2020   TRIG 115 03/27/2020    Wt Readings from Last 3 Encounters:  04/21/20 161 lb 6 oz (73.2 kg)  04/18/20 161 lb (73 kg)  04/16/20 160 lb (72.6 kg)     ASSESSMENT AND PLAN:  Problem List Items Addressed This Visit      Cardiology Problems   Arterial hypotension - Primary   Relevant Orders   EKG 12-Lead   Stroke (cerebrum) (HCC) - mult B anterior infarcts s/p tPA + R MCA revascularizion.   Relevant Orders   EKG 12-Lead     Other   SAH (subarachnoid hemorrhage) ([place]) secondary to IR    Other Visit Diagnoses    PAD (peripheral artery disease) ([place])         History of stroke Recent hospitalization (records reviewed), stroke,  PAD discussed with him Coronary occlusion noted  Orthostasis Symptomatic hypotension,  systolic pressures 35-32 Likely exacerbated by radical weight loss over the past year, down 25 pounds He is started eating more, limits diet, previously was missing meals, drinking alcohol -Wife is now cooking, he has stopped drinking -As he is symptomatic, would like to maintain pressures 120 or higher given recent stroke, we will start Florinef 0.1 mg every other day with slow titration up to daily if needed -Discussed midodrine but will avoid for now given vascular constriction effect of the medication -He is hydrated, discussed compression hose, back brace, recommended he not avoid salt  PAD: On Crestor, stop smoking Goal LDL less than 70    Total encounter time more than 45 minutes  Greater than 50% was spent in counseling and coordination of care with the patient    Signed, Esmond Plants, M.D., Ph.D. [place], [place]

## 2020-04-22 ENCOUNTER — Ambulatory Visit: Payer: Medicare HMO | Admitting: Speech Pathology

## 2020-04-22 ENCOUNTER — Ambulatory Visit: Payer: Medicare HMO | Admitting: Occupational Therapy

## 2020-04-22 VITALS — BP 91/64

## 2020-04-22 DIAGNOSIS — I63511 Cerebral infarction due to unspecified occlusion or stenosis of right middle cerebral artery: Secondary | ICD-10-CM | POA: Diagnosis not present

## 2020-04-22 DIAGNOSIS — R278 Other lack of coordination: Secondary | ICD-10-CM | POA: Diagnosis not present

## 2020-04-22 DIAGNOSIS — R41841 Cognitive communication deficit: Secondary | ICD-10-CM | POA: Diagnosis not present

## 2020-04-22 DIAGNOSIS — M6281 Muscle weakness (generalized): Secondary | ICD-10-CM | POA: Diagnosis not present

## 2020-04-23 ENCOUNTER — Encounter: Payer: Self-pay | Admitting: Occupational Therapy

## 2020-04-23 ENCOUNTER — Encounter: Payer: Self-pay | Admitting: Speech Pathology

## 2020-04-23 NOTE — Therapy (Addendum)
[place] MAIN [facility] SERVICES [address] [place], [place], [address] Phone: [phone]   Fax:  [phone]  Speech Language Pathology Evaluation  Patient Details  Name: Ethan Fitzgerald MRN: [id number] Date of Birth: 1955/12/02 Referring Provider (SLP): Alysia Penna   Encounter Date: 04/22/2020   End of Session - 04/23/20 1302    Visit Number 1    Number of Visits 25    Date for SLP Re-Evaluation 07/16/20    Authorization Type [employer]    Authorization Time Period 04/22/2020 thru 07/16/2020    Authorization - Visit Number 1    Progress Note Due on Visit 10    SLP Start Time 1000    SLP Stop Time  1100    SLP Time Calculation (min) 60 min    Activity Tolerance Patient tolerated treatment well           Past Medical History:  Diagnosis Date  . COPD (chronic obstructive pulmonary disease) (HCC)    mild  . COVID-19 09/2019  . Erectile dysfunction   . Headache    daily, stress headaches  . Knee pain, left   . Seasonal allergies   . Tobacco dependence     Past Surgical History:  Procedure Laterality Date  . BACK SURGERY     metal plate in back  . COLONOSCOPY    . COLONOSCOPY WITH PROPOFOL N/A 04/26/2019   Procedure: COLONOSCOPY WITH PROPOFOL;  Surgeon: Jonathon Bellows, MD;  Location: [facility] ENDOSCOPY;  Service: Gastroenterology;  Laterality: N/A;  . HERNIA REPAIR Right   . IR ANGIO INTRA EXTRACRAN SEL COM CAROTID INNOMINATE UNI L MOD SED  03/27/2020  . IR ANGIO VERTEBRAL SEL SUBCLAVIAN INNOMINATE UNI L MOD SED  03/27/2020  . IR ANGIO VERTEBRAL SEL VERTEBRAL UNI R MOD SED  03/27/2020  . IR CT HEAD LTD  03/27/2020  . IR PERCUTANEOUS ART THROMBECTOMY/INFUSION INTRACRANIAL INC DIAG ANGIO  03/27/2020  . KNEE ARTHROSCOPY Left 03/28/2015   Procedure: Arthroscopic partial medial meniscectomy plus chondral debridement;  Surgeon: Leanor Kail, MD;  Location: [place];  Service: Orthopedics;  Laterality: Left;  . RADIOLOGY WITH  ANESTHESIA N/A 03/27/2020   Procedure: IR WITH ANESTHESIA;  Surgeon: Luanne Bras, MD;  Location: [place];  Service: Radiology;  Laterality: N/A;    There were no vitals filed for this visit.   Subjective Assessment - 04/23/20 1251    Subjective pt pleasant, conversant, very obvious left side neglect - right gaze preference    H&P Ethan Fitzgerald is a 64 y.o. left-handed male with history of COPD, daily headaches, dyslipidemia increasing coughing episodes was admitted 03/27/2020 with left-sided weakness, difficulty walking and difficulty speaking.  CT head showed dense M1 segment with occlusive thrombus, occlusion of left ICA age-indeterminate with distal reconstitution of cavernous segment and good perfusion of left MCA and bilateral ACAs.  He received TPA and underwent cerebral angio with partial revascularization of right-MCA dominant division with unsuccessful attempts at revascularization of proximal right ICA.  Follow-up MRI brain showed acute cortical/subcortical right MCA infarct with additional infarcts in right caudate nucleus, right pre and postcentral gyri and mid to anterior frontal lobe with SAH and incidental right parotid neoplasm.                SLP Evaluation [facility] - 04/23/20 1251      SLP Visit Information   SLP Received On 04/22/20    Referring Provider (SLP) Alysia Penna    Onset Date 03/27/2020  Medical Diagnosis CVA      Subjective   Subjective pt pleasant, obvious left neglect, right gaze preference    Patient/Family Stated Goal to be able to drive      Balance Screen   Has the patient fallen in the past 6 months Yes    How many times? once during event    Has the patient had a decrease in activity level because of a fear of falling?  No    Is the patient reluctant to leave their home because of a fear of falling?  No      Prior Functional Status   Cognitive/Linguistic Baseline Within functional limits    Type of Home House     Lives With Spouse     Education college    Vocation Full time employment      Pain Assessment   Pain Assessment No/denies pain      Cognition   Overall Cognitive Status Impaired/Different from baseline    Area of Impairment Attention;Safety/judgement;Awareness;Problem solving;Memory    Current Attention Level Selective    Memory Decreased recall of precautions    Safety/Judgement Decreased awareness of safety;Decreased awareness of deficits    Awareness Intellectual    Problem Solving Difficulty sequencing;Requires verbal cues    Attention Selective    Selective Attention Impaired    Selective Attention Impairment Verbal complex;Functional complex    Memory Impaired    Memory Impairment Decreased recall of new information;Decreased short term memory    Decreased Short Term Memory Verbal complex;Functional complex    Awareness Impaired    Awareness Impairment Emergent impairment;Anticipatory impairment    Problem Solving Impaired    Problem Solving Impairment Verbal complex;Functional complex    Corporate treasurer;Sequencing;Organizing;Self Correcting    Reasoning Impaired    Reasoning Impairment Verbal complex;Functional complex    Sequencing Impaired    Sequencing Impairment Verbal complex;Functional complex    Organizing Impaired    Organizing Impairment Verbal complex;Functional complex    Self Correcting Impaired    Self Correcting Impairment Verbal complex;Functional complex      Auditory Comprehension   Overall Auditory Comprehension Appears within functional limits for tasks assessed      Visual Recognition/Discrimination   Discrimination Within Function Limits      Reading Comprehension   Reading Status Not tested      Expression   Primary Mode of Expression Verbal      Verbal Expression   Overall Verbal Expression Appears within functional limits for tasks assessed      Written Expression   Dominant Hand Left    Written Expression Within Functional Limits      Oral  Motor/Sensory Function   Overall Oral Motor/Sensory Function Impaired    Labial ROM Within Functional Limits    Labial Symmetry Within Functional Limits    Labial Strength Within Functional Limits    Labial Sensation Within Functional Limits    Labial Coordination WFL    Lingual ROM Within Functional Limits    Lingual Symmetry Within Functional Limits    Lingual Strength Within Functional Limits    Lingual Sensation Within Functional Limits    Lingual Coordination WFL    Facial Symmetry Left droop    Facial Strength Reduced    Facial Sensation Reduced    Facial Coordination Reduced    Velum Within Functional Limits    Mandible Within Functional Limits    Overall Oral Motor/Sensory Function severe left sided flaccidity      Motor Speech  Overall Motor Speech Impaired    Respiration Within functional limits    Phonation Normal    Resonance Within functional limits    Articulation Impaired    Level of Impairment Conversation    Intelligibility Intelligibility reduced    Word 75-100% accurate    Phrase 75-100% accurate    Sentence 75-100% accurate    Conversation --   80%   Motor Planning Witnin functional limits    Motor Speech Errors Not applicable    Effective Techniques Slow rate;Over-articulate    Phonation WFL      Standardized Assessments   Standardized Assessments  Cognitive Linguistic Quick Test      Cognitive Linguistic Quick Test (Ages 18-69)   Attention Mild    Memory Mild    Executive Function Moderate    Language WNL    Visuospatial Skills Moderate    Severity Rating Total 14    Composite Severity Rating 12.4      Individuals Consulted   Consulted and Agree with Results and Recommendations Patient               SLP Education - 04/23/20 1302    Education Details education provided on areas of deficits, need for ST services to improve funcitonal independence and ST POC    Person(s) Educated Patient    Methods Explanation;Verbal cues;Demonstration     Comprehension Verbalized understanding              SLP Long Term Goals - 04/23/20 1441      SLP LONG TERM GOAL #1   Title Patient will identify cognitive-communication barriers and participate in developing functional compensatory strategies.    Baseline new goal    Time 12    Period Weeks    Status New    Target Date 07/16/20      SLP LONG TERM GOAL #2   Title Patient will demonstrate functional cognitive-communication skills for successful return to work.    Baseline new goal    Time 12    Period Weeks    Status New    Target Date 07/16/20      SLP LONG TERM GOAL #3   Title Pt will improve cognitive linguistic function in all communicative contexts.    Baseline new goal    Time 12    Period Weeks    Status New    Target Date 07/16/20            Plan - 04/23/20 1303    Clinical Impression Statement Pt presents with mild to moderate cognitive deficits. Specifically the Cognitive Linguistic Quick Test indicated a mild impairment in attention, memory and clock drawing with moderate impairments in executive functions and visuospatial skills. Additionally, pt has significant left neglect with right gaze preference - seldom neutral gaze even with speaker in front of him at his midline. He lacks awareness and insight into safety. For example, he wishes to drive again. Per him, there isn't anything currently prevents him from driving safely. In addition to the above mentioned cognitive deficits, pt has significant left sided facial weakness which results in imprecise articulation and reduce intelligibility at the conversation level (~80%). Skilled ST therapy is medically necessary to increase pt's insight and safety, increase functional independence and reduce caregiver burden.    Speech Therapy Frequency 2x / week    Duration --   12 weeks   Treatment/Interventions Cognitive reorganization;SLP instruction and feedback;Functional tasks;Patient/family education    Potential to  Achieve Goals Good  Potential Considerations Previous level of function    SLP Home Exercise Plan provided    Consulted and Agree with Plan of Care Patient           Patient will benefit from skilled therapeutic intervention in order to improve the following deficits and impairments:   Cognitive communication deficit    Problem List Patient Active Problem List   Diagnosis Date Noted  . History of cerebrovascular accident (CVA) with residual deficit 04/16/2020  . ICAO (internal carotid artery occlusion), left 04/16/2020  . Hemiplegia of dominant side following cerebrovascular accident (CVA) ([place]) 04/16/2020  . 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 Parotid mass 03/31/2020  . Carotid stenosis 03/31/2020  . Arterial hypotension 03/31/2020  . Dysphagia due to recent stroke 03/31/2020  . COPD without exacerbation ([place]) 03/31/2020  . Hypokalemia 03/31/2020  . Right shoulder pain 03/31/2020  . SAH (subarachnoid hemorrhage) ([place]) secondary to IR 03/31/2020  . Stroke (cerebrum) (HCC) - mult B anterior infarcts s/p tPA + R MCA revascularizion. 03/27/2020  . Middle cerebral artery embolism, right 03/27/2020  . History of COVID-19 12/04/2019  . Centrilobular emphysema ([place]) 04/03/2019  . Seasonal allergies 02/18/2017  . Elevated hemoglobin A1c 02/18/2017  . Erectile dysfunction 02/18/2017  . Hyperlipidemia 02/18/2017  . History of hemorrhoids 02/18/2017  . Former smoker 02/18/2017  . Chronic low back pain 02/18/2017  . Chronic pain of left knee 02/18/2017   Javell Blackburn B. Rutherford Nail M.S., CCC-SLP, [place] Pathologist Rehabilitation Services Office [phone]  Stormy Fabian 04/23/2020, 2:47 PM  [place] MAIN [facility] SERVICES [address] [place], [place], [address] Phone: [phone]   Fax:  [phone]  Name: Ethan Fitzgerald MRN: [id number] Date of Birth: March 27, 1956

## 2020-04-23 NOTE — Therapy (Signed)
[place] MAIN [facility] SERVICES [address] [place], [place], [address] Phone: [phone]   Fax:  [phone]  Occupational Therapy Treatment  Patient Details  Name: Ethan Fitzgerald MRN: [id number] Date of Birth: 1956/06/18 Referring Provider (OT): Kirsteins   Encounter Date: 04/22/2020   OT End of Session - 04/23/20 2013    Visit Number 2    Number of Visits 24    Date for OT Re-Evaluation 07/08/20    OT Start Time 1100    OT Stop Time 1144    OT Time Calculation (min) 44 min    Activity Tolerance Patient tolerated treatment well    Behavior During Therapy [facility] for tasks assessed/performed           Past Medical History:  Diagnosis Date  . COPD (chronic obstructive pulmonary disease) (HCC)    mild  . COVID-19 09/2019  . Erectile dysfunction   . Headache    daily, stress headaches  . Knee pain, left   . Seasonal allergies   . Tobacco dependence     Past Surgical History:  Procedure Laterality Date  . BACK SURGERY     metal plate in back  . COLONOSCOPY    . COLONOSCOPY WITH PROPOFOL N/A 04/26/2019   Procedure: COLONOSCOPY WITH PROPOFOL;  Surgeon: Jonathon Bellows, MD;  Location: [facility] ENDOSCOPY;  Service: Gastroenterology;  Laterality: N/A;  . HERNIA REPAIR Right   . IR ANGIO INTRA EXTRACRAN SEL COM CAROTID INNOMINATE UNI L MOD SED  03/27/2020  . IR ANGIO VERTEBRAL SEL SUBCLAVIAN INNOMINATE UNI L MOD SED  03/27/2020  . IR ANGIO VERTEBRAL SEL VERTEBRAL UNI R MOD SED  03/27/2020  . IR CT HEAD LTD  03/27/2020  . IR PERCUTANEOUS ART THROMBECTOMY/INFUSION INTRACRANIAL INC DIAG ANGIO  03/27/2020  . KNEE ARTHROSCOPY Left 03/28/2015   Procedure: Arthroscopic partial medial meniscectomy plus chondral debridement;  Surgeon: Leanor Kail, MD;  Location: [place];  Service: Orthopedics;  Laterality: Left;  . RADIOLOGY WITH ANESTHESIA N/A 03/27/2020   Procedure: IR WITH ANESTHESIA;  Surgeon: Luanne Bras, MD;  Location: [place];   Service: Radiology;  Laterality: N/A;    Vitals:   04/22/20 1108  BP: 91/64     Subjective Assessment - 04/23/20 2010    Subjective  Patient reports he has had some shoulder pain recently.    Pertinent History [name] is a 64 y.o. left-handed male with history of COPD, daily headaches, dyslipidemia increasing coughing episodes was admitted 03/27/2020 with left-sided weakness, difficulty walking and difficulty speaking.  CT head showed dense M1 segment with occlusive thrombus, occlusion of left ICA age-indeterminate with distal reconstitution of cavernous segment and good perfusion of left MCA and bilateral ACAs.  He received TPA and underwent cerebral angio with partial revascularization of right-MCA dominant division with unsuccessful attempts at revascularization of proximal right ICA.  Follow-up MRI brain showed acute cortical/subcortical right MCA infarct with additional infarcts in right caudate nucleus, right pre and postcentral gyri and mid to anterior frontal lobe with SAH and incidental right parotid neoplasm.    Patient Stated Goals Patient would like to be able to work again, needs to be able to drive to return.    Currently in Pain? Yes    Pain Score 3     Pain Location Shoulder    Pain Orientation Left    Pain Descriptors / Indicators Aching    Pain Type Acute pain    Pain Onset More than a month  ago    Pain Frequency Intermittent    Multiple Pain Sites No           Shoulder pain in left shoulder, 3/10 with specific movements  Patient seen in supine for focus on joint mobs to left [place] joint for posterior and inferior glides, grade III to decrease pain and improve motion.  Shoulder stabilization exercises for shoulder flexion to 90 degrees, place and hold, external perturbations, forwards and backwards circles, triceps extension. AAROM for left shoulder, all joints and planes.  Pain improved with exercises to 1/10.    2# dowel exs for shoulder flexion to 100 degrees,  ABD/ADD, chest press, forwards and backwards circles, cues for proper form and technique.   10 reps each exercise.  Reaching tasks in multi directions with left UE.    Emphasis on activities for Left neglect Scatter pile of Cards to pick up in sequential order by suit, some difficulty picking up flat cards from flat surface, has to pull to the edge to retrieve card.     Response to tx:   Patient with some shoulder pain this date which lessened after therapeutic intervention.  Pain decreased to 1/10 by end of session with select movements.  No pain at rest.  Patient continues to demonstrate left neglect and benefits from tasks presented to left side.  Occasional cues required for left sided attention.  Patient did well with sequential task since he was aware he needed to continue to look for item until he found it.  Continue OT to improve left UE function for necessary daily tasks.                    OT Education - 04/23/20 2012    Education Details coordination skills, shoulder stabilization exercises    Person(s) Educated Patient    Methods Explanation;Demonstration    Comprehension Verbalized understanding;Returned demonstration               OT Long Term Goals - 04/18/20 1400      OT LONG TERM GOAL #1   Title Patient will be independent with home exercise program for strengthening and coordination    Baseline will need modifications to current HEP as he progresses    Time 12    Period Weeks    Status New    Target Date 07/08/20      OT LONG TERM GOAL #2   Title Patient will complete homemaking tasks of vacuuming and sweeping with modified independence and good balance    Baseline difficulty at eval    Time 6    Period Weeks    Status New    Target Date 05/29/20      OT LONG TERM GOAL #3   Title Patient will improve left shoulder flexion by 20 degrees to assist with placing items in and out of the closet.    Baseline left shoulder flex 118    Time 6     Period Weeks    Status New    Target Date 05/29/20      OT LONG TERM GOAL #4   Title Pt will improve left UE strength by 1 mm grade to assist with performing household maintenance tasks.    Baseline 4/5 left UE    Time 6    Period Weeks    Status New    Target Date 05/29/20      OT LONG TERM GOAL #5   Title Patient will complete tub/shower transfer with  modified independence and good safety    Baseline supervision    Time 6    Period Weeks    Status New    Target Date 05/29/20      Long Term Additional Goals   Additional Long Term Goals Yes      OT LONG TERM GOAL #6   Title Patient will perform meal preparation with modified independence.    Baseline unable at eval    Time 12    Period Weeks    Status New    Target Date 07/08/20      OT LONG TERM GOAL #7   Title Pt will demonstrate ability to use non power tools for home maintenance tasks with good safety awareness and coordination.    Baseline difficulty at eval    Time 12    Period Weeks    Status New    Target Date 07/08/20      OT LONG TERM GOAL #8   Title Patient will increase FOTO score to equal to or greater than 76  to demonstrate statistically significant improvement in ADL/IADLs and quality of life.    Baseline FOTO at eval 65    Time 12    Period Weeks    Status New    Target Date 07/08/20                 Plan - 04/23/20 2013    Clinical Impression Statement Patient with some shoulder pain this date which lessened after therapeutic intervention.  Pain decreased to 1/10 by end of session with select movements.  No pain at rest.  Patient continues to demonstrate left neglect and benefits from tasks presented to left side.  Occasional cues required for left sided attention.  Patient did well with sequential task since he was aware he needed to continue to look for item until he found it.  Continue OT to improve left UE function for necessary daily tasks.    OT Occupational Profile and History Detailed  Assessment- Review of Records and additional review of physical, cognitive, psychosocial history related to current functional performance    Occupational performance deficits (Please refer to evaluation for details): ADL's;IADL's;Work;Leisure    Body Structure / Function / Physical Skills ADL;Dexterity;ROM;Strength;Vision;Balance;Coordination;FMC;IADL;Pain;UE functional use;GMC    Psychosocial Skills Environmental  Adaptations;Routines and Behaviors;Habits    Rehab Potential Good    Clinical Decision Making Limited treatment options, no task modification necessary    Comorbidities Affecting Occupational Performance: May have comorbidities impacting occupational performance    Modification or Assistance to Complete Evaluation  No modification of tasks or assist necessary to complete eval    OT Frequency 2x / week    OT Duration 12 weeks    OT Treatment/Interventions Self-care/ADL training;Cryotherapy;Therapeutic exercise;DME and/or AE instruction;Functional Mobility Training;Balance training;Neuromuscular education;Manual Therapy;Visual/perceptual remediation/compensation;Moist Heat;Contrast Bath;Therapeutic activities;Patient/family education    Consulted and Agree with Plan of Care Patient           Patient will benefit from skilled therapeutic intervention in order to improve the following deficits and impairments:   Body Structure / Function / Physical Skills: ADL, Dexterity, ROM, Strength, Vision, Balance, Coordination, FMC, IADL, Pain, UE functional use, GMC   Psychosocial Skills: Environmental  Adaptations, Routines and Behaviors, Habits   Visit Diagnosis: Muscle weakness (generalized)  Other lack of coordination    Problem List Patient Active Problem List   Diagnosis Date Noted  . History of cerebrovascular accident (CVA) with residual deficit 04/16/2020  . ICAO (internal carotid artery  occlusion), left 04/16/2020  . Hemiplegia of dominant side following cerebrovascular  accident (CVA) ([place]) 04/16/2020  . 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 Parotid mass 03/31/2020  . Carotid stenosis 03/31/2020  . Arterial hypotension 03/31/2020  . Dysphagia due to recent stroke 03/31/2020  . COPD without exacerbation ([place]) 03/31/2020  . Hypokalemia 03/31/2020  . Right shoulder pain 03/31/2020  . SAH (subarachnoid hemorrhage) ([place]) secondary to IR 03/31/2020  . Stroke (cerebrum) (HCC) - mult B anterior infarcts s/p tPA + R MCA revascularizion. 03/27/2020  . Middle cerebral artery embolism, right 03/27/2020  . History of COVID-19 12/04/2019  . Centrilobular emphysema ([place]) 04/03/2019  . Seasonal allergies 02/18/2017  . Elevated hemoglobin A1c 02/18/2017  . Erectile dysfunction 02/18/2017  . Hyperlipidemia 02/18/2017  . History of hemorrhoids 02/18/2017  . Former smoker 02/18/2017  . Chronic low back pain 02/18/2017  . Chronic pain of left knee 02/18/2017   Jacqualin Shirkey T Tyland Klemens, OTR/L, CLT  Asa Fath 04/23/2020, 10:59 PM  [place] MAIN [facility] SERVICES [address] [place], [place], [address] Phone: [phone]   Fax:  [phone]  Name: COAL NEARHOOD MRN: [id number] Date of Birth: 07/06/1956

## 2020-04-24 ENCOUNTER — Other Ambulatory Visit: Payer: Self-pay | Admitting: Family Medicine

## 2020-04-24 ENCOUNTER — Ambulatory Visit: Payer: Medicare HMO | Admitting: Occupational Therapy

## 2020-04-24 ENCOUNTER — Encounter: Payer: Self-pay | Admitting: Occupational Therapy

## 2020-04-24 ENCOUNTER — Other Ambulatory Visit: Payer: Self-pay

## 2020-04-24 DIAGNOSIS — E782 Mixed hyperlipidemia: Secondary | ICD-10-CM

## 2020-04-24 DIAGNOSIS — I63511 Cerebral infarction due to unspecified occlusion or stenosis of right middle cerebral artery: Secondary | ICD-10-CM | POA: Diagnosis not present

## 2020-04-24 DIAGNOSIS — M6281 Muscle weakness (generalized): Secondary | ICD-10-CM | POA: Diagnosis not present

## 2020-04-24 DIAGNOSIS — R278 Other lack of coordination: Secondary | ICD-10-CM | POA: Diagnosis not present

## 2020-04-24 DIAGNOSIS — R41841 Cognitive communication deficit: Secondary | ICD-10-CM | POA: Diagnosis not present

## 2020-04-24 NOTE — Therapy (Signed)
[place] MAIN [facility] SERVICES [address] [place], [place], [address] Phone: [phone]   Fax:  [phone]  Occupational Therapy Treatment  Patient Details  Name: Ethan Fitzgerald MRN: [id number] Date of Birth: Mar 02, 1956 Referring Provider (OT): Kirsteins   Encounter Date: 04/24/2020   OT End of Session - 04/24/20 2058    Visit Number 3    Number of Visits 24    Date for OT Re-Evaluation 07/08/20    OT Start Time 1059    OT Stop Time 1146    OT Time Calculation (min) 47 min    Activity Tolerance Patient tolerated treatment well    Behavior During Therapy [facility] for tasks assessed/performed           Past Medical History:  Diagnosis Date  . COPD (chronic obstructive pulmonary disease) (HCC)    mild  . COVID-19 09/2019  . Erectile dysfunction   . Headache    daily, stress headaches  . Knee pain, left   . Seasonal allergies   . Tobacco dependence     Past Surgical History:  Procedure Laterality Date  . BACK SURGERY     metal plate in back  . COLONOSCOPY    . COLONOSCOPY WITH PROPOFOL N/A 04/26/2019   Procedure: COLONOSCOPY WITH PROPOFOL;  Surgeon: Jonathon Bellows, MD;  Location: [facility] ENDOSCOPY;  Service: Gastroenterology;  Laterality: N/A;  . HERNIA REPAIR Right   . IR ANGIO INTRA EXTRACRAN SEL COM CAROTID INNOMINATE UNI L MOD SED  03/27/2020  . IR ANGIO VERTEBRAL SEL SUBCLAVIAN INNOMINATE UNI L MOD SED  03/27/2020  . IR ANGIO VERTEBRAL SEL VERTEBRAL UNI R MOD SED  03/27/2020  . IR CT HEAD LTD  03/27/2020  . IR PERCUTANEOUS ART THROMBECTOMY/INFUSION INTRACRANIAL INC DIAG ANGIO  03/27/2020  . KNEE ARTHROSCOPY Left 03/28/2015   Procedure: Arthroscopic partial medial meniscectomy plus chondral debridement;  Surgeon: Leanor Kail, MD;  Location: [place];  Service: Orthopedics;  Laterality: Left;  . RADIOLOGY WITH ANESTHESIA N/A 03/27/2020   Procedure: IR WITH ANESTHESIA;  Surgeon: Luanne Bras, MD;  Location: [place];   Service: Radiology;  Laterality: N/A;    There were no vitals filed for this visit.   Subjective Assessment - 04/24/20 2056    Subjective  Patient reports his shoulder felt better after treatment last time, still has pain at times. 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    Pertinent History Ethan Fitzgerald is a 64 y.o. left-handed Ethan with history of COPD, daily headaches, dyslipidemia increasing coughing episodes was admitted 03/27/2020 with left-sided weakness, difficulty walking and difficulty speaking.  CT head showed dense M1 segment with occlusive thrombus, occlusion of left ICA age-indeterminate with distal reconstitution of cavernous segment and good perfusion of left MCA and bilateral ACAs.  He received TPA and underwent cerebral angio with partial revascularization of right-MCA dominant division with unsuccessful attempts at revascularization of proximal right ICA.  Follow-up MRI brain showed acute cortical/subcortical right MCA infarct with additional infarcts in right caudate nucleus, right pre and postcentral gyri and mid to anterior frontal lobe with SAH and incidental right parotid neoplasm.    Patient Stated Goals Patient would like to be able to work again, needs to be able to drive to return.    Currently in Pain? Yes    Pain Score 1     Pain Location Shoulder    Pain Orientation Left    Pain Descriptors / Indicators Aching  Pain Type Acute pain    Pain Onset More than a month ago    Pain Frequency Intermittent    Multiple Pain Sites No             Patient seen this date for focus on manipulation skills, dexterity to manage a variety of types of nuts and bolts and use of hand tools.   Tool bench with placing and removing nuts/bolts onto workbench with left hand under the bench for screwing nuts with vision occluded.  Patient performed well with removing the nuts but had more difficulty with placing them on.  Difficulty with threading nut onto bolt.  Use of  allen wrench this date to manage one row, increased time to seek and find correct size wrench to use as well as increased time to complete task.  Patient was very focused, tended to pay attention more to right side of the bench than left, minimal cues required.    Response to tx:  Patient with decreased shoulder pain this date but will need further attention and assessment to work towards improved ROM and use.  Continues to demonstrate left sided neglect during tasks which focused attention more to right side.  Patient responds well to cues.  Able to demonstrate hand tool use today but requires increased time to complete tasks.  Continue to work towards goals in plan of care to maximize safety and independence in daily tasks.                    OT Education - 04/24/20 2057    Education Details coordination skills, shoulder stabilization exercises    Person(s) Educated Patient    Methods Explanation;Demonstration    Comprehension Verbalized understanding;Returned demonstration               OT Long Term Goals - 04/18/20 1400      OT LONG TERM GOAL #1   Title Patient will be independent with home exercise program for strengthening and coordination    Baseline will need modifications to current HEP as he progresses    Time 12    Period Weeks    Status New    Target Date 07/08/20      OT LONG TERM GOAL #2   Title Patient will complete homemaking tasks of vacuuming and sweeping with modified independence and good balance    Baseline difficulty at eval    Time 6    Period Weeks    Status New    Target Date 05/29/20      OT LONG TERM GOAL #3   Title Patient will improve left shoulder flexion by 20 degrees to assist with placing items in and out of the closet.    Baseline left shoulder flex 118    Time 6    Period Weeks    Status New    Target Date 05/29/20      OT LONG TERM GOAL #4   Title Pt will improve left UE strength by 1 mm grade to assist with performing  household maintenance tasks.    Baseline 4/5 left UE    Time 6    Period Weeks    Status New    Target Date 05/29/20      OT LONG TERM GOAL #5   Title Patient will complete tub/shower transfer with modified independence and good safety    Baseline supervision    Time 6    Period Weeks    Status New  Target Date 05/29/20      Long Term Additional Goals   Additional Long Term Goals Yes      OT LONG TERM GOAL #6   Title Patient will perform meal preparation with modified independence.    Baseline unable at eval    Time 12    Period Weeks    Status New    Target Date 07/08/20      OT LONG TERM GOAL #7   Title Pt will demonstrate ability to use non power tools for home maintenance tasks with good safety awareness and coordination.    Baseline difficulty at eval    Time 12    Period Weeks    Status New    Target Date 07/08/20      OT LONG TERM GOAL #8   Title Patient will increase FOTO score to equal to or greater than 76  to demonstrate statistically significant improvement in ADL/IADLs and quality of life.    Baseline FOTO at eval 65    Time 12    Period Weeks    Status New    Target Date 07/08/20                 Plan - 04/24/20 2058    Clinical Impression Statement Patient with decreased shoulder pain this date but will need further attention and assessment to work towards improved ROM and use.  Continues to demonstrate left sided neglect during tasks which focused attention more to right side.  Patient responds well to cues.  Able to demonstrate hand tool use today but requires increased time to complete tasks.  Continue to work towards goals in plan of care to maximize safety and independence in daily tasks.    OT Occupational Profile and History Detailed Assessment- Review of Records and additional review of physical, cognitive, psychosocial history related to current functional performance    Occupational performance deficits (Please refer to evaluation for  details): ADL's;IADL's;Work;Leisure    Body Structure / Function / Physical Skills ADL;Dexterity;ROM;Strength;Vision;Balance;Coordination;FMC;IADL;Pain;UE functional use;GMC    Psychosocial Skills Environmental  Adaptations;Routines and Behaviors;Habits    Rehab Potential Good    Clinical Decision Making Limited treatment options, no task modification necessary    Comorbidities Affecting Occupational Performance: May have comorbidities impacting occupational performance    Modification or Assistance to Complete Evaluation  No modification of tasks or assist necessary to complete eval    OT Frequency 2x / week    OT Duration 12 weeks    OT Treatment/Interventions Self-care/ADL training;Cryotherapy;Therapeutic exercise;DME and/or AE instruction;Functional Mobility Training;Balance training;Neuromuscular education;Manual Therapy;Visual/perceptual remediation/compensation;Moist Heat;Contrast Bath;Therapeutic activities;Patient/family education    Consulted and Agree with Plan of Care Patient           Patient will benefit from skilled therapeutic intervention in order to improve the following deficits and impairments:   Body Structure / Function / Physical Skills: ADL, Dexterity, ROM, Strength, Vision, Balance, Coordination, FMC, IADL, Pain, UE functional use, GMC   Psychosocial Skills: Environmental  Adaptations, Routines and Behaviors, Habits   Visit Diagnosis: Muscle weakness (generalized)  Other lack of coordination  Cognitive communication deficit    Problem List Patient Active Problem List   Diagnosis Date Noted  . History of cerebrovascular accident (CVA) with residual deficit 04/16/2020  . ICAO (internal carotid artery occlusion), left 04/16/2020  . Hemiplegia of dominant side following cerebrovascular accident (CVA) ([place]) 04/16/2020  . 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 Parotid mass 03/31/2020  . Carotid  stenosis 03/31/2020   . Arterial hypotension 03/31/2020  . Dysphagia due to recent stroke 03/31/2020  . COPD without exacerbation ([place]) 03/31/2020  . Hypokalemia 03/31/2020  . Right shoulder pain 03/31/2020  . SAH (subarachnoid hemorrhage) ([place]) secondary to IR 03/31/2020  . Stroke (cerebrum) (HCC) - mult B anterior infarcts s/p tPA + R MCA revascularizion. 03/27/2020  . Middle cerebral artery embolism, right 03/27/2020  . History of COVID-19 12/04/2019  . Centrilobular emphysema ([place]) 04/03/2019  . Seasonal allergies 02/18/2017  . Elevated hemoglobin A1c 02/18/2017  . Erectile dysfunction 02/18/2017  . Hyperlipidemia 02/18/2017  . History of hemorrhoids 02/18/2017  . Former smoker 02/18/2017  . Chronic low back pain 02/18/2017  . Chronic pain of left knee 02/18/2017   Ethan Fitzgerald, OTR/L, CLT  Ethan Fitzgerald 04/24/2020, 9:07 PM  [place] MAIN [facility] SERVICES [address] [place], [place], [address] Phone: [phone]   Fax:  [phone]  Name: Ethan Fitzgerald MRN: [id number] Date of Birth: 1955-09-17

## 2020-04-28 ENCOUNTER — Encounter: Payer: Self-pay | Admitting: Occupational Therapy

## 2020-04-28 ENCOUNTER — Other Ambulatory Visit: Payer: Self-pay

## 2020-04-28 ENCOUNTER — Ambulatory Visit: Payer: Medicare HMO | Admitting: Occupational Therapy

## 2020-04-28 DIAGNOSIS — M6281 Muscle weakness (generalized): Secondary | ICD-10-CM | POA: Diagnosis not present

## 2020-04-28 DIAGNOSIS — R278 Other lack of coordination: Secondary | ICD-10-CM | POA: Diagnosis not present

## 2020-04-28 DIAGNOSIS — R41841 Cognitive communication deficit: Secondary | ICD-10-CM | POA: Diagnosis not present

## 2020-04-28 DIAGNOSIS — I63511 Cerebral infarction due to unspecified occlusion or stenosis of right middle cerebral artery: Secondary | ICD-10-CM | POA: Diagnosis not present

## 2020-04-28 NOTE — Therapy (Signed)
[place] MAIN [facility] SERVICES [address] [place], [place], [address] Phone: [phone]   Fax:  [phone]  Occupational Therapy Treatment  Patient Details  Name: Ethan Fitzgerald MRN: [id number] Date of Birth: 04/25/1956 Referring Provider (OT): Kirsteins   Encounter Date: 04/28/2020   OT End of Session - 04/28/20 1729    Visit Number 4    Number of Visits 24    Date for OT Re-Evaluation 07/08/20    OT Start Time 0930    OT Stop Time 1015    OT Time Calculation (min) 45 min    Activity Tolerance Patient tolerated treatment well    Behavior During Therapy [facility] for tasks assessed/performed           Past Medical History:  Diagnosis Date   COPD (chronic obstructive pulmonary disease) ([place])    mild   COVID-19 09/2019   Erectile dysfunction    Headache    daily, stress headaches   Knee pain, left    Seasonal allergies    Tobacco dependence     Past Surgical History:  Procedure Laterality Date   BACK SURGERY     metal plate in back   COLONOSCOPY     COLONOSCOPY WITH PROPOFOL N/A 04/26/2019   Procedure: COLONOSCOPY WITH PROPOFOL;  Surgeon: Jonathon Bellows, MD;  Location: [facility] ENDOSCOPY;  Service: Gastroenterology;  Laterality: N/A;   HERNIA REPAIR Right    IR ANGIO INTRA EXTRACRAN SEL COM CAROTID INNOMINATE UNI L MOD SED  03/27/2020   IR ANGIO VERTEBRAL SEL SUBCLAVIAN INNOMINATE UNI L MOD SED  03/27/2020   IR ANGIO VERTEBRAL SEL VERTEBRAL UNI R MOD SED  03/27/2020   IR CT HEAD LTD  03/27/2020   IR PERCUTANEOUS ART THROMBECTOMY/INFUSION INTRACRANIAL INC DIAG ANGIO  03/27/2020   KNEE ARTHROSCOPY Left 03/28/2015   Procedure: Arthroscopic partial medial meniscectomy plus chondral debridement;  Surgeon: Leanor Kail, MD;  Location: [place];  Service: Orthopedics;  Laterality: Left;   RADIOLOGY WITH ANESTHESIA N/A 03/27/2020   Procedure: IR WITH ANESTHESIA;  Surgeon: Luanne Bras, MD;  Location: [place];   Service: Radiology;  Laterality: N/A;    There were no vitals filed for this visit.   Subjective Assessment - 04/28/20 1727    Subjective  Pt reports he is doing well today, some frustration when he tries to work with tools at home and would like to get to the point in which he can at least use an electric drill again.    Pertinent History Ethan Fitzgerald is a 64 y.o. left-handed male with history of COPD, daily headaches, dyslipidemia increasing coughing episodes was admitted 03/27/2020 with left-sided weakness, difficulty walking and difficulty speaking.  CT head showed dense M1 segment with occlusive thrombus, occlusion of left ICA age-indeterminate with distal reconstitution of cavernous segment and good perfusion of left MCA and bilateral ACAs.  He received TPA and underwent cerebral angio with partial revascularization of right-MCA dominant division with unsuccessful attempts at revascularization of proximal right ICA.  Follow-up MRI brain showed acute cortical/subcortical right MCA infarct with additional infarcts in right caudate nucleus, right pre and postcentral gyri and mid to anterior frontal lobe with SAH and incidental right parotid neoplasm.    Patient Stated Goals Patient would like to be able to work again, needs to be able to drive to return.    Currently in Pain? No/denies             Neuro Re-Ed:  Pt seen  for OT tx this date to f/u re: [facility] skills of L UE and visual scanning to address L neglect. Pt in good spirits this date, but does report some frustration with attempting to work with manual tools at home and ultimately would like to get back to working with power tools. OT incorporates [place] tasks into session including translation of objects with L hand digits, rotation and pincer versus tripod grasp. Pt tolerates well, moderately successful at threading washers over angular dowel rods. In addition, coins used to practice functional grasp patterns. Pt with 10% drops during this  task. Visual scanning task competed with pt requiring MIN verbal cues to sequence and attend to far left to organize cards by number. Pt tolerates session well overall, plan to continue to address [facility] and L neglect per OT POC.                   OT Education - 04/28/20 1728    Education Details [place] of L UE, stretching/resting between tasks    Person(s) Educated Patient    Methods Explanation;Demonstration    Comprehension Verbalized understanding;Returned demonstration               OT Long Term Goals - 04/18/20 1400      OT LONG TERM GOAL #1   Title Patient will be independent with home exercise program for strengthening and coordination    Baseline will need modifications to current HEP as he progresses    Time 12    Period Weeks    Status New    Target Date 07/08/20      OT LONG TERM GOAL #2   Title Patient will complete homemaking tasks of vacuuming and sweeping with modified independence and good balance    Baseline difficulty at eval    Time 6    Period Weeks    Status New    Target Date 05/29/20      OT LONG TERM GOAL #3   Title Patient will improve left shoulder flexion by 20 degrees to assist with placing items in and out of the closet.    Baseline left shoulder flex 118    Time 6    Period Weeks    Status New    Target Date 05/29/20      OT LONG TERM GOAL #4   Title Pt will improve left UE strength by 1 mm grade to assist with performing household maintenance tasks.    Baseline 4/5 left UE    Time 6    Period Weeks    Status New    Target Date 05/29/20      OT LONG TERM GOAL #5   Title Patient will complete tub/shower transfer with modified independence and good safety    Baseline supervision    Time 6    Period Weeks    Status New    Target Date 05/29/20      Long Term Additional Goals   Additional Long Term Goals Yes      OT LONG TERM GOAL #6   Title Patient will perform meal preparation with modified independence.    Baseline  unable at eval    Time 12    Period Weeks    Status New    Target Date 07/08/20      OT LONG TERM GOAL #7   Title Pt will demonstrate ability to use non power tools for home maintenance tasks with good safety awareness and coordination.  Baseline difficulty at eval    Time 12    Period Weeks    Status New    Target Date 07/08/20      OT LONG TERM GOAL #8   Title Patient will increase FOTO score to equal to or greater than 76  to demonstrate statistically significant improvement in ADL/IADLs and quality of life.    Baseline FOTO at eval 65    Time 12    Period Weeks    Status New    Target Date 07/08/20                 Plan - 04/28/20 1729    Clinical Impression Statement Pt seen for OT treatment this date. Pt presents to therapy reporting that he is doing well, but experiencing frustration when trying to work on skills such as using manual tools at home. OT incorporates fine motor cooridnation tasks into session for which pt is moderately successful including threading washers onto dowel rods and translating/rotating coins in hand.    OT Occupational Profile and History Detailed Assessment- Review of Records and additional review of physical, cognitive, psychosocial history related to current functional performance    Occupational performance deficits (Please refer to evaluation for details): ADL's;IADL's;Work;Leisure    Body Structure / Function / Physical Skills ADL;Dexterity;ROM;Strength;Vision;Balance;Coordination;FMC;IADL;Pain;UE functional use;GMC    Psychosocial Skills Environmental  Adaptations;Routines and Behaviors;Habits    Rehab Potential Good    Clinical Decision Making Limited treatment options, no task modification necessary    Comorbidities Affecting Occupational Performance: May have comorbidities impacting occupational performance    Modification or Assistance to Complete Evaluation  No modification of tasks or assist necessary to complete eval    OT  Frequency 2x / week    OT Duration 12 weeks    OT Treatment/Interventions Self-care/ADL training;Cryotherapy;Therapeutic exercise;DME and/or AE instruction;Functional Mobility Training;Balance training;Neuromuscular education;Manual Therapy;Visual/perceptual remediation/compensation;Moist Heat;Contrast Bath;Therapeutic activities;Patient/family education    Plan to continue addressing L neglect and L sided strength/coordination    OT Home Exercise Plan L UE ROM of digits/wrist    Consulted and Agree with Plan of Care Patient           Patient will benefit from skilled therapeutic intervention in order to improve the following deficits and impairments:   Body Structure / Function / Physical Skills: ADL, Dexterity, ROM, Strength, Vision, Balance, Coordination, FMC, IADL, Pain, UE functional use, GMC   Psychosocial Skills: Environmental  Adaptations, Routines and Behaviors, Habits   Visit Diagnosis: Muscle weakness (generalized)  Other lack of coordination    Problem List Patient Active Problem List   Diagnosis Date Noted   History of cerebrovascular accident (CVA) with residual deficit 04/16/2020   ICAO (internal carotid artery occlusion), left 04/16/2020   Hemiplegia of dominant side following cerebrovascular accident (CVA) ([place]) 04/16/2020   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) 04/16/2020   Parotid mass 03/31/2020   Carotid stenosis 03/31/2020   Arterial hypotension 03/31/2020   Dysphagia due to recent stroke 03/31/2020   COPD without exacerbation ([place]) 03/31/2020   Hypokalemia 03/31/2020   Right shoulder pain 03/31/2020   SAH (subarachnoid hemorrhage) ([place]) secondary to IR 03/31/2020   Stroke (cerebrum) (HCC) - mult B anterior infarcts s/p tPA + R MCA revascularizion. 03/27/2020   Middle cerebral artery embolism, right 03/27/2020   History of COVID-19 12/04/2019   Centrilobular emphysema ([place]) 04/03/2019   Seasonal  allergies 02/18/2017   Elevated hemoglobin A1c 02/18/2017   Erectile dysfunction 02/18/2017   Hyperlipidemia 02/18/2017  History of hemorrhoids 02/18/2017   Former smoker 02/18/2017   Chronic low back pain 02/18/2017   Chronic pain of left knee 02/18/2017    Ethan Fitzgerald 04/28/2020, 5:37 PM  [place] MAIN [facility] SERVICES [address] [place], [place], [address] Phone: [phone]   Fax:  [phone]  Name: Ethan Fitzgerald MRN: [id number] Date of Birth: July 31, 1956

## 2020-04-29 NOTE — Chronic Care Management (AMB) (Signed)
  Care Management   Note  04/29/2020 Name: Ethan Fitzgerald MRN: [id number] DOB: 11-16-1955  Ethan Fitzgerald is a 64 y.o. year old male who is a primary care patient of Olin Hauser, DO and is actively engaged with the care management team. I reached out to Janene Harvey by phone today to assist with re-scheduling a follow up visit with the Pharmacist  Follow up plan: Telephone appointment with care management team member scheduled for:05/16/2020  Noreene Larsson, [place], [place], [place] [address] Direct Dial: [phone] [email or web address]

## 2020-04-29 NOTE — Telephone Encounter (Signed)
Pt has been r/s for 05/16/2020

## 2020-04-30 ENCOUNTER — Other Ambulatory Visit: Payer: Self-pay

## 2020-05-01 ENCOUNTER — Other Ambulatory Visit: Payer: Self-pay

## 2020-05-01 ENCOUNTER — Ambulatory Visit: Payer: Medicare HMO | Admitting: Speech Pathology

## 2020-05-01 ENCOUNTER — Other Ambulatory Visit: Payer: Self-pay | Admitting: Family Medicine

## 2020-05-01 DIAGNOSIS — I63511 Cerebral infarction due to unspecified occlusion or stenosis of right middle cerebral artery: Secondary | ICD-10-CM | POA: Diagnosis not present

## 2020-05-01 DIAGNOSIS — R278 Other lack of coordination: Secondary | ICD-10-CM | POA: Diagnosis not present

## 2020-05-01 DIAGNOSIS — E782 Mixed hyperlipidemia: Secondary | ICD-10-CM

## 2020-05-01 DIAGNOSIS — R41841 Cognitive communication deficit: Secondary | ICD-10-CM

## 2020-05-01 DIAGNOSIS — M6281 Muscle weakness (generalized): Secondary | ICD-10-CM | POA: Diagnosis not present

## 2020-05-02 ENCOUNTER — Encounter: Payer: Self-pay | Admitting: Speech Pathology

## 2020-05-02 NOTE — Therapy (Signed)
[place] MAIN [facility] SERVICES [address] [place], [place], [address] Phone: [phone]   Fax:  [phone]  Speech Language Pathology Treatment  Patient Details  Name: Ethan Fitzgerald MRN: [id number] Date of Birth: [birth year] Referring Provider (SLP): Alysia Penna   Encounter Date: 05/01/2020   End of Session - 05/02/20 1128    Visit Number 2    Number of Visits 25    Date for SLP Re-Evaluation 07/16/20    Authorization Type [employer]    Authorization Time Period 04/22/2020 thru 07/16/2020    Authorization - Visit Number 2    Progress Note Due on Visit 10    SLP Start Time 1100    SLP Stop Time  1200    SLP Time Calculation (min) 60 min    Activity Tolerance Patient tolerated treatment well           Past Medical History:  Diagnosis Date  . COPD (chronic obstructive pulmonary disease) (HCC)    mild  . COVID-19 09/2019  . Erectile dysfunction   . Headache    daily, stress headaches  . Knee pain, left   . Seasonal allergies   . Tobacco dependence     Past Surgical History:  Procedure Laterality Date  . BACK SURGERY     metal plate in back  . COLONOSCOPY    . COLONOSCOPY WITH PROPOFOL N/A 04/26/2019   Procedure: COLONOSCOPY WITH PROPOFOL;  Surgeon: Jonathon Bellows, MD;  Location: [facility] ENDOSCOPY;  Service: Gastroenterology;  Laterality: N/A;  . HERNIA REPAIR Right   . IR ANGIO INTRA EXTRACRAN SEL COM CAROTID INNOMINATE UNI L MOD SED  03/27/2020  . IR ANGIO VERTEBRAL SEL SUBCLAVIAN INNOMINATE UNI L MOD SED  03/27/2020  . IR ANGIO VERTEBRAL SEL VERTEBRAL UNI R MOD SED  03/27/2020  . IR CT HEAD LTD  03/27/2020  . IR PERCUTANEOUS ART THROMBECTOMY/INFUSION INTRACRANIAL INC DIAG ANGIO  03/27/2020  . KNEE ARTHROSCOPY Left 03/28/2015   Procedure: Arthroscopic partial medial meniscectomy plus chondral debridement;  Surgeon: Leanor Kail, MD;  Location: [place];  Service: Orthopedics;  Laterality: Left;  . RADIOLOGY WITH  ANESTHESIA N/A 03/27/2020   Procedure: IR WITH ANESTHESIA;  Surgeon: Luanne Bras, MD;  Location: [place];  Service: Radiology;  Laterality: N/A;    There were no vitals filed for this visit.   Subjective Assessment - 05/02/20 1124    Subjective Pt pleasant, eager, increased insight into deficits    Currently in Pain? No/denies                 ADULT SLP TREATMENT - 05/02/20 0001      General Information   Behavior/Cognition Alert;Cooperative;Pleasant mood    HPI ARGEL PABLO is a 64 y.o. left-handed male with history of COPD, daily headaches, dyslipidemia increasing coughing episodes was admitted 03/27/2020 with left-sided weakness, difficulty walking and difficulty speaking.  CT head showed dense M1 segment with occlusive thrombus, occlusion of left ICA age-indeterminate with distal reconstitution of cavernous segment and good perfusion of left MCA and bilateral ACAs.  He received TPA and underwent cerebral angio with partial revascularization of right-MCA dominant division with unsuccessful attempts at revascularization of proximal right ICA.  Follow-up MRI brain showed acute cortical/subcortical right MCA infarct with additional infarcts in right caudate nucleus, right pre and postcentral gyri and mid to anterior frontal lobe with SAH and incidental right parotid neoplasm.       Treatment Provided   Treatment provided Cognitive-Linquistic  Pain Assessment   Pain Assessment No/denies pain      Cognitive-Linquistic Treatment   Treatment focused on Cognition    Skilled Treatment moderate faded to minimal cues for divided attention, organizing, sequencing problem solving task, decreased speech inteligiblity as cognitive load increases      Assessment / Recommendations / Plan   Plan Continue with current plan of care      Progression Toward Goals   Progression toward goals Progressing toward goals            SLP Education - 05/02/20 1128    Education Details  provided on recovery process    Person(s) Educated Patient    Methods Explanation;Demonstration;Verbal cues    Comprehension Verbalized understanding              SLP Long Term Goals - 04/23/20 1441      SLP LONG TERM GOAL #1   Title Patient will identify cognitive-communication barriers and participate in developing functional compensatory strategies.    Baseline new goal    Time 12    Period Weeks    Status New    Target Date 07/16/20      SLP LONG TERM GOAL #2   Title Patient will demonstrate functional cognitive-communication skills for successful return to work.    Baseline new goal    Time 12    Period Weeks    Status New    Target Date 07/16/20      SLP LONG TERM GOAL #3   Title Pt will improve cognitive linguistic function in all communicative contexts.    Baseline new goal    Time 12    Period Weeks    Status New    Target Date 07/16/20            Plan - 05/02/20 1128    Clinical Impression Statement Skilled treatment session targeted pt's executive function goals. SLP facilitated session by providing moderate faded to minimal cues for divided attention, organization and sequencing of semi-complex reasoning task and pt was moderately independent with left attention when all items placed on his left.    Speech Therapy Frequency 2x / week    Duration 12 weeks    Treatment/Interventions Cognitive reorganization;SLP instruction and feedback;Functional tasks;Patient/family education    Potential to Achieve Goals Good    Potential Considerations Previous level of function    SLP Home Exercise Plan provided    Consulted and Agree with Plan of Care Patient           Patient will benefit from skilled therapeutic intervention in order to improve the following deficits and impairments:   Cognitive communication deficit    Problem List Patient Active Problem List   Diagnosis Date Noted  . History of cerebrovascular accident (CVA) with residual deficit  04/16/2020  . ICAO (internal carotid artery occlusion), left 04/16/2020  . Hemiplegia of dominant side following cerebrovascular accident (CVA) ([place]) 04/16/2020  . 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 Parotid mass 03/31/2020  . Carotid stenosis 03/31/2020  . Arterial hypotension 03/31/2020  . Dysphagia due to recent stroke 03/31/2020  . COPD without exacerbation ([place]) 03/31/2020  . Hypokalemia 03/31/2020  . Right shoulder pain 03/31/2020  . SAH (subarachnoid hemorrhage) ([place]) secondary to IR 03/31/2020  . Stroke (cerebrum) (HCC) - mult B anterior infarcts s/p tPA + R MCA revascularizion. 03/27/2020  . Middle cerebral artery embolism, right 03/27/2020  . History of COVID-19 12/04/2019  . Centrilobular emphysema ([place])  04/03/2019  . Seasonal allergies 02/18/2017  . Elevated hemoglobin A1c 02/18/2017  . Erectile dysfunction 02/18/2017  . Hyperlipidemia 02/18/2017  . History of hemorrhoids 02/18/2017  . Former smoker 02/18/2017  . Chronic low back pain 02/18/2017  . Chronic pain of left knee 02/18/2017   Bowman Higbie B. Rutherford Nail M.S., CCC-SLP, [place] Pathologist Rehabilitation Services Office [phone]  Stormy Fabian 05/02/2020, 11:30 AM  [place] MAIN [facility] SERVICES [address] [place], [place], [address] Phone: [phone]   Fax:  [phone]   Name: GRAYSON PFEFFERLE MRN: [id number] Date of Birth: 1956-03-04

## 2020-05-06 ENCOUNTER — Ambulatory Visit: Payer: Medicare HMO | Admitting: Occupational Therapy

## 2020-05-06 ENCOUNTER — Ambulatory Visit: Payer: Medicare HMO | Admitting: Speech Pathology

## 2020-05-06 ENCOUNTER — Other Ambulatory Visit: Payer: Self-pay | Admitting: Physical Medicine and Rehabilitation

## 2020-05-06 ENCOUNTER — Other Ambulatory Visit: Payer: Self-pay

## 2020-05-06 ENCOUNTER — Encounter: Payer: Self-pay | Admitting: Speech Pathology

## 2020-05-06 ENCOUNTER — Encounter: Payer: Self-pay | Admitting: Occupational Therapy

## 2020-05-06 ENCOUNTER — Telehealth: Payer: Self-pay | Admitting: Pulmonary Disease

## 2020-05-06 DIAGNOSIS — R41841 Cognitive communication deficit: Secondary | ICD-10-CM

## 2020-05-06 DIAGNOSIS — I63511 Cerebral infarction due to unspecified occlusion or stenosis of right middle cerebral artery: Secondary | ICD-10-CM | POA: Diagnosis not present

## 2020-05-06 DIAGNOSIS — R278 Other lack of coordination: Secondary | ICD-10-CM | POA: Diagnosis not present

## 2020-05-06 DIAGNOSIS — E782 Mixed hyperlipidemia: Secondary | ICD-10-CM

## 2020-05-06 DIAGNOSIS — M6281 Muscle weakness (generalized): Secondary | ICD-10-CM | POA: Diagnosis not present

## 2020-05-06 MED ORDER — STIOLTO RESPIMAT 2.5-2.5 MCG/ACT IN AERS: 2.0000 | INHALATION_SPRAY | Freq: Every day | RESPIRATORY_TRACT | 1 refills | Status: DC

## 2020-05-06 NOTE — Therapy (Signed)
[place] MAIN [facility] SERVICES [address] [place], [place], [address] Phone: [phone]   Fax:  [phone]  Occupational Therapy Treatment  Patient Details  Name: DONATO STUDLEY MRN: [id number] Date of Birth: 07/08/1956 Referring Provider (OT): Kirsteins   Encounter Date: 05/06/2020   OT End of Session - 05/06/20 1110    Visit Number 5    Number of Visits 24    Date for OT Re-Evaluation 07/08/20    OT Start Time 1100    OT Stop Time 1145    OT Time Calculation (min) 45 min    Activity Tolerance Patient tolerated treatment well    Behavior During Therapy [facility] for tasks assessed/performed           Past Medical History:  Diagnosis Date   COPD (chronic obstructive pulmonary disease) ([place])    mild   COVID-19 09/2019   Erectile dysfunction    Headache    daily, stress headaches   Knee pain, left    Seasonal allergies    Tobacco dependence     Past Surgical History:  Procedure Laterality Date   BACK SURGERY     metal plate in back   COLONOSCOPY     COLONOSCOPY WITH PROPOFOL N/A 04/26/2019   Procedure: COLONOSCOPY WITH PROPOFOL;  Surgeon: Jonathon Bellows, MD;  Location: [facility] ENDOSCOPY;  Service: Gastroenterology;  Laterality: N/A;   HERNIA REPAIR Right    IR ANGIO INTRA EXTRACRAN SEL COM CAROTID INNOMINATE UNI L MOD SED  03/27/2020   IR ANGIO VERTEBRAL SEL SUBCLAVIAN INNOMINATE UNI L MOD SED  03/27/2020   IR ANGIO VERTEBRAL SEL VERTEBRAL UNI R MOD SED  03/27/2020   IR CT HEAD LTD  03/27/2020   IR PERCUTANEOUS ART THROMBECTOMY/INFUSION INTRACRANIAL INC DIAG ANGIO  03/27/2020   KNEE ARTHROSCOPY Left 03/28/2015   Procedure: Arthroscopic partial medial meniscectomy plus chondral debridement;  Surgeon: Leanor Kail, MD;  Location: [place];  Service: Orthopedics;  Laterality: Left;   RADIOLOGY WITH ANESTHESIA N/A 03/27/2020   Procedure: IR WITH ANESTHESIA;  Surgeon: Luanne Bras, MD;  Location: [place];   Service: Radiology;  Laterality: N/A;    There were no vitals filed for this visit.   Subjective Assessment - 05/06/20 1103    Subjective  Was supposed to go to the beach this week but not going now.    Pertinent History MARSELINO SLAYTON is a 64 y.o. left-handed male with history of COPD, daily headaches, dyslipidemia increasing coughing episodes was admitted 03/27/2020 with left-sided weakness, difficulty walking and difficulty speaking.  CT head showed dense M1 segment with occlusive thrombus, occlusion of left ICA age-indeterminate with distal reconstitution of cavernous segment and good perfusion of left MCA and bilateral ACAs.  He received TPA and underwent cerebral angio with partial revascularization of right-MCA dominant division with unsuccessful attempts at revascularization of proximal right ICA.  Follow-up MRI brain showed acute cortical/subcortical right MCA infarct with additional infarcts in right caudate nucleus, right pre and postcentral gyri and mid to anterior frontal lobe with SAH and incidental right parotid neoplasm.    Patient Stated Goals Patient would like to be able to work again, needs to be able to drive to return.    Currently in Pain? No/denies    Pain Score 0-No pain            Pt reports he is teaching his grandson how to help with the drywall and tile work in the bathroom for the remodel.  Therex: Participating in finger strengthening task of placing push pins into moderate resistive board with use of left hand and with items placed on left side for increased attention to task.  Neuro: jamar tweezer dexterity with left hand and working on left side vertical completion, placing into grid with left hand but removing with tweezers.  Self care: Handwriting with left hand for name, address, phone number.  Unlined paper utilized micrographia noted.  Lines drawn and letters/numbers still small.  Able to correct with cues to make items more legible.    Response to tx:    Patient continues to complain of numbness in his left hand which affects his ability to complete using the hand for storage without dropping any items.  Difficulty with holding small pieces of jamar dexterity in palm to place one by one in the board, removing with tweezers with left hand use.  Cues at times for prehension patterns.  Continue OT towards goals to improve functional hand use for necessary daily tasks.                    OT Education - 05/06/20 1104    Education Details finger strengthening, fmc    Person(s) Educated Patient    Methods Explanation;Demonstration    Comprehension Verbalized understanding;Returned demonstration               OT Long Term Goals - 04/18/20 1400      OT LONG TERM GOAL #1   Title Patient will be independent with home exercise program for strengthening and coordination    Baseline will need modifications to current HEP as he progresses    Time 12    Period Weeks    Status New    Target Date 07/08/20      OT LONG TERM GOAL #2   Title Patient will complete homemaking tasks of vacuuming and sweeping with modified independence and good balance    Baseline difficulty at eval    Time 6    Period Weeks    Status New    Target Date 05/29/20      OT LONG TERM GOAL #3   Title Patient will improve left shoulder flexion by 20 degrees to assist with placing items in and out of the closet.    Baseline left shoulder flex 118    Time 6    Period Weeks    Status New    Target Date 05/29/20      OT LONG TERM GOAL #4   Title Pt will improve left UE strength by 1 mm grade to assist with performing household maintenance tasks.    Baseline 4/5 left UE    Time 6    Period Weeks    Status New    Target Date 05/29/20      OT LONG TERM GOAL #5   Title Patient will complete tub/shower transfer with modified independence and good safety    Baseline supervision    Time 6    Period Weeks    Status New    Target Date 05/29/20       Long Term Additional Goals   Additional Long Term Goals Yes      OT LONG TERM GOAL #6   Title Patient will perform meal preparation with modified independence.    Baseline unable at eval    Time 12    Period Weeks    Status New    Target Date 07/08/20  OT LONG TERM GOAL #7   Title Pt will demonstrate ability to use non power tools for home maintenance tasks with good safety awareness and coordination.    Baseline difficulty at eval    Time 12    Period Weeks    Status New    Target Date 07/08/20      OT LONG TERM GOAL #8   Title Patient will increase FOTO score to equal to or greater than 76  to demonstrate statistically significant improvement in ADL/IADLs and quality of life.    Baseline FOTO at eval 65    Time 12    Period Weeks    Status New    Target Date 07/08/20                 Plan - 05/06/20 1111    Clinical Impression Statement Patient continues to complain of numbness in his left hand which affects his ability to complete using the hand for storage without dropping any items.  Difficulty with holding small pieces of jamar dexterity in palm to place one by one in the board, removing with tweezers with left hand use.  Cues at times for prehension patterns.  Continue OT towards goals to improve functional hand use for necessary daily tasks.    OT Occupational Profile and History Detailed Assessment- Review of Records and additional review of physical, cognitive, psychosocial history related to current functional performance    Occupational performance deficits (Please refer to evaluation for details): ADL's;IADL's;Work;Leisure    Body Structure / Function / Physical Skills ADL;Dexterity;ROM;Strength;Vision;Balance;Coordination;FMC;IADL;Pain;UE functional use;GMC    Psychosocial Skills Environmental  Adaptations;Routines and Behaviors;Habits    Rehab Potential Good    Clinical Decision Making Limited treatment options, no task modification necessary     Comorbidities Affecting Occupational Performance: May have comorbidities impacting occupational performance    Modification or Assistance to Complete Evaluation  No modification of tasks or assist necessary to complete eval    OT Frequency 2x / week    OT Duration 12 weeks    OT Treatment/Interventions Self-care/ADL training;Cryotherapy;Therapeutic exercise;DME and/or AE instruction;Functional Mobility Training;Balance training;Neuromuscular education;Manual Therapy;Visual/perceptual remediation/compensation;Moist Heat;Contrast Bath;Therapeutic activities;Patient/family education    Plan to continue addressing L neglect and L sided strength/coordination    OT Home Exercise Plan L UE ROM of digits/wrist    Consulted and Agree with Plan of Care Patient           Patient will benefit from skilled therapeutic intervention in order to improve the following deficits and impairments:   Body Structure / Function / Physical Skills: ADL, Dexterity, ROM, Strength, Vision, Balance, Coordination, FMC, IADL, Pain, UE functional use, GMC   Psychosocial Skills: Environmental  Adaptations, Routines and Behaviors, Habits   Visit Diagnosis: Muscle weakness (generalized)  Other lack of coordination  Cognitive communication deficit    Problem List Patient Active Problem List   Diagnosis Date Noted   History of cerebrovascular accident (CVA) with residual deficit 04/16/2020   ICAO (internal carotid artery occlusion), left 04/16/2020   Hemiplegia of dominant side following cerebrovascular accident (CVA) ([place]) 04/16/2020   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 03/27/2020   Middle cerebral artery embolism, right 03/27/2020   History of COVID-19 12/04/2019   Centrilobular emphysema ([place]) 04/03/2019   Seasonal allergies 02/18/2017   Elevated hemoglobin A1c 02/18/2017   Erectile dysfunction 02/18/2017   Hyperlipidemia 02/18/2017   History of hemorrhoids 02/18/2017   Former smoker 02/18/2017   Chronic low back pain 02/18/2017   Chronic pain of left knee 02/18/2017   Nereida Schepp T Eulah Walkup, OTR/L, CLT  Grisel Blumenstock 05/06/2020, 5:34 PM  [place] MAIN [facility] SERVICES [address] [place], [place], [address] Phone: [phone]   Fax:  [phone]  Name: PENNY FRISBIE MRN: [id number] Date of Birth: 1956/06/21

## 2020-05-06 NOTE — Telephone Encounter (Signed)
05/06/2020  Received notification from [facility] the patient is requesting refills of Stiolto Respimat.  Okay to provide these refills. Can provide 6 refills. Okay to send to [facility] mail delivery.  Please ensure patient is in for recall to establish with a new pulmonologist in the [place] office. Either Dr. Patsey Berthold or Dr. Mortimer Fries.   Wyn Quaker FNP

## 2020-05-06 NOTE — Telephone Encounter (Signed)
I checked and there is a recall in place for the pt to est with Patsey Berthold or Mortimer Fries in [place]- just waiting for space on their schedule to be open. I have refilled the pt's Stiolto for 90 days with 1 refill to = 6 months.

## 2020-05-06 NOTE — Therapy (Addendum)
[place] MAIN [facility] SERVICES [address] [place], [place], [address] Phone: [phone]   Fax:  [phone]  Speech Language Pathology Treatment  Patient Details  Name: Ethan Fitzgerald MRN: [id number] Date of Birth: [birth year] Referring Provider (SLP): Alysia Penna   Encounter Date: 05/06/2020   End of Session - 05/06/20 1729    Visit Number 3    Number of Visits 25    Date for SLP Re-Evaluation 07/16/20    Authorization Type [employer]    Authorization Time Period 04/22/2020 thru 07/16/2020    Authorization - Visit Number 2    Progress Note Due on Visit 10    SLP Start Time 1000    SLP Stop Time  1100    SLP Time Calculation (min) 60 min    Activity Tolerance Patient tolerated treatment well           Past Medical History:  Diagnosis Date  . COPD (chronic obstructive pulmonary disease) (HCC)    mild  . COVID-19 09/2019  . Erectile dysfunction   . Headache    daily, stress headaches  . Knee pain, left   . Seasonal allergies   . Tobacco dependence     Past Surgical History:  Procedure Laterality Date  . BACK SURGERY     metal plate in back  . COLONOSCOPY    . COLONOSCOPY WITH PROPOFOL N/A 04/26/2019   Procedure: COLONOSCOPY WITH PROPOFOL;  Surgeon: Jonathon Bellows, MD;  Location: [facility] ENDOSCOPY;  Service: Gastroenterology;  Laterality: N/A;  . HERNIA REPAIR Right   . IR ANGIO INTRA EXTRACRAN SEL COM CAROTID INNOMINATE UNI L MOD SED  03/27/2020  . IR ANGIO VERTEBRAL SEL SUBCLAVIAN INNOMINATE UNI L MOD SED  03/27/2020  . IR ANGIO VERTEBRAL SEL VERTEBRAL UNI R MOD SED  03/27/2020  . IR CT HEAD LTD  03/27/2020  . IR PERCUTANEOUS ART THROMBECTOMY/INFUSION INTRACRANIAL INC DIAG ANGIO  03/27/2020  . KNEE ARTHROSCOPY Left 03/28/2015   Procedure: Arthroscopic partial medial meniscectomy plus chondral debridement;  Surgeon: Leanor Kail, MD;  Location: [place];  Service: Orthopedics;  Laterality: Left;  . RADIOLOGY WITH  ANESTHESIA N/A 03/27/2020   Procedure: IR WITH ANESTHESIA;  Surgeon: Luanne Bras, MD;  Location: [place];  Service: Radiology;  Laterality: N/A;    There were no vitals filed for this visit.   Subjective Assessment - 05/06/20 1728    Subjective Pt pleasant, eager, increased insight into deficits    Currently in Pain? No/denies                 ADULT SLP TREATMENT - 05/06/20 0001      General Information   Behavior/Cognition Alert;Cooperative;Pleasant mood    HPI Ethan Fitzgerald is a 64 y.o. left-handed male with history of COPD, daily headaches, dyslipidemia increasing coughing episodes was admitted 03/27/2020 with left-sided weakness, difficulty walking and difficulty speaking.  CT head showed dense M1 segment with occlusive thrombus, occlusion of left ICA age-indeterminate with distal reconstitution of cavernous segment and good perfusion of left MCA and bilateral ACAs.  He received TPA and underwent cerebral angio with partial revascularization of right-MCA dominant division with unsuccessful attempts at revascularization of proximal right ICA.  Follow-up MRI brain showed acute cortical/subcortical right MCA infarct with additional infarcts in right caudate nucleus, right pre and postcentral gyri and mid to anterior frontal lobe with SAH and incidental right parotid neoplasm.       Treatment Provided   Treatment provided Cognitive-Linquistic  Pain Assessment   Pain Assessment No/denies pain      Cognitive-Linquistic Treatment   Treatment focused on Cognition    Skilled Treatment moderate faded to minimal cues for divided attention, organizing, sequencing problem solving task, decreased speech inteligiblity as cognitive load increases      Assessment / Recommendations / Plan   Plan Continue with current plan of care      Progression Toward Goals   Progression toward goals Progressing toward goals            SLP Education - 05/06/20 1728    Education Details  provided on cognitive hierarchy    Person(s) Educated Patient    Methods Explanation;Demonstration    Comprehension Verbalized understanding              SLP Long Term Goals - 04/23/20 1441      SLP LONG TERM GOAL #1   Title Patient will identify cognitive-communication barriers and participate in developing functional compensatory strategies.    Baseline new goal    Time 12    Period Weeks    Status New    Target Date 07/16/20      SLP LONG TERM GOAL #2   Title Patient will demonstrate functional cognitive-communication skills for successful return to work.    Baseline new goal    Time 12    Period Weeks    Status New    Target Date 07/16/20      SLP LONG TERM GOAL #3   Title Pt will improve cognitive linguistic function in all communicative contexts.    Baseline new goal    Time 12    Period Weeks    Status New    Target Date 07/16/20            Plan - 05/06/20 1729    Clinical Impressions Statement       Speech Therapy Frequency Skilled treatment session targeted pt's executive function goals. SLP facilitated session by providing moderate faded to minimal cues for divided attention, organization, and sequencing of semi-complex reasoning task and pt was moderately independent with left attention when all items placed on his left.    2x / week    Duration 12 weeks    Treatment/Interventions Cognitive reorganization;SLP instruction and feedback;Functional tasks;Patient/family education    Potential to Achieve Goals Good    Potential Considerations Previous level of function    SLP Home Exercise Plan provided    Consulted and Agree with Plan of Care Patient           Patient will benefit from skilled therapeutic intervention in order to improve the following deficits and impairments:   Cognitive communication deficit    Problem List Patient Active Problem List   Diagnosis Date Noted  . History of cerebrovascular accident (CVA) with residual  deficit 04/16/2020  . ICAO (internal carotid artery occlusion), left 04/16/2020  . Hemiplegia of dominant side following cerebrovascular accident (CVA) ([place]) 04/16/2020  . 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 Parotid mass 03/31/2020  . Carotid stenosis 03/31/2020  . Arterial hypotension 03/31/2020  . Dysphagia due to recent stroke 03/31/2020  . COPD without exacerbation ([place]) 03/31/2020  . Hypokalemia 03/31/2020  . Right shoulder pain 03/31/2020  . SAH (subarachnoid hemorrhage) ([place]) secondary to IR 03/31/2020  . Stroke (cerebrum) (HCC) - mult B anterior infarcts s/p tPA + R MCA revascularizion. 03/27/2020  . Middle cerebral artery embolism, right 03/27/2020  . History of COVID-19 12/04/2019  .  Centrilobular emphysema ([place]) 04/03/2019  . Seasonal allergies 02/18/2017  . Elevated hemoglobin A1c 02/18/2017  . Erectile dysfunction 02/18/2017  . Hyperlipidemia 02/18/2017  . History of hemorrhoids 02/18/2017  . Former smoker 02/18/2017  . Chronic low back pain 02/18/2017  . Chronic pain of left knee 02/18/2017   Larance Ratledge B. Rutherford Nail M.S., CCC-SLP, [place] Pathologist Rehabilitation Services Office [phone]  Stormy Fabian 05/06/2020, 5:30 PM  [place] MAIN [facility] SERVICES [address] [place], [place], [address] Phone: [phone]   Fax:  [phone]   Name: Ethan Fitzgerald MRN: [id number] Date of Birth: 1956-02-22

## 2020-05-07 ENCOUNTER — Other Ambulatory Visit: Payer: Self-pay

## 2020-05-07 ENCOUNTER — Ambulatory Visit: Payer: Medicare HMO | Admitting: Adult Health

## 2020-05-07 ENCOUNTER — Encounter: Payer: Self-pay | Admitting: Family Medicine

## 2020-05-07 ENCOUNTER — Encounter: Payer: Self-pay | Admitting: Adult Health

## 2020-05-07 VITALS — BP 112/88 | HR 74 | Ht 72.0 in | Wt 168.0 lb

## 2020-05-07 DIAGNOSIS — E785 Hyperlipidemia, unspecified: Secondary | ICD-10-CM | POA: Diagnosis not present

## 2020-05-07 DIAGNOSIS — K118 Other diseases of salivary glands: Secondary | ICD-10-CM

## 2020-05-07 DIAGNOSIS — G903 Multi-system degeneration of the autonomic nervous system: Secondary | ICD-10-CM | POA: Diagnosis not present

## 2020-05-07 DIAGNOSIS — I639 Cerebral infarction, unspecified: Secondary | ICD-10-CM | POA: Diagnosis not present

## 2020-05-07 DIAGNOSIS — I6523 Occlusion and stenosis of bilateral carotid arteries: Secondary | ICD-10-CM | POA: Diagnosis not present

## 2020-05-07 MED ORDER — FLUDROCORTISONE ACETATE 0.1 MG PO TABS: 0.1000 mg | ORAL_TABLET | Freq: Every day | ORAL | 0 refills | Status: DC

## 2020-05-07 NOTE — Progress Notes (Signed)
I agree with the above plan 

## 2020-05-07 NOTE — Patient Instructions (Addendum)
Continue to work with outpatient therapy for hopeful ongoing improvement  Referral placed to ENT for further evaluation of your parotid lesion  Continue to follow up with cardiology regarding blood pressure and use of Florinef   I will reach out to Dr. Estanislado Pandy office to schedule follow up visit regarding carotid stenosis and narrowing   Continue aspirin 325 mg daily and clopidogrel 75 mg daily  and Crestor  for secondary stroke prevention  Continue plavix for additional 2 months and then aspirin alone  Continue to follow up with PCP regarding cholesterol and blood pressure management  Maintain strict control of hypertension with blood pressure goal below 130/90 and cholesterol with LDL cholesterol (bad cholesterol) goal below 70 mg/dL.       Followup in the future with me in 3 months or call earlier if needed       Thank you for coming to see [place] at [facility]. I hope we have been able to provide you high quality care today.  You may receive a patient satisfaction survey over the next few weeks. We would appreciate your feedback and comments so that we may continue to improve ourselves and the health of our patients.

## 2020-05-07 NOTE — Progress Notes (Signed)
[facility] [address] [place]. [place] [address] [phone]       HOSPITAL FOLLOW UP NOTE  [name] Date of Birth:  26-May-1956 Medical Record Number:  [id number]   Reason for Referral:  hospital stroke follow up    SUBJECTIVE:   CHIEF COMPLAINT:  Chief Complaint  Patient presents with  . Follow-up    rm 5  . Hospitalization Follow-up    Pt said he has light headed and dizziness. Pt is with his wife [name].    HPI:   EthanEthan Fitzgerald a 64 y.o.malewith history of COPDwho presented on 03/27/2020 with L sided weakness. Stroke work-up revealed right MCA and multiple right and left anterior circulation infarcts s/p tPA and IR with partial revascularization of R MCA and s/p angioplasty for acute R ICA occlusion with reocclusion with resultant small SAH, most likely secondary to large vessel disease with questionable dissection due to coughing episode vs hypothyroid state from parotid neoplasm.  Recommended aspirin alone until [facility] resolves and then consider DAPT for 3 months then aspirin alone.  Incidental finding of parotid mass on MRI suspicious for primary neoplasm and recommended ENT for further evaluation.  Carotid stenosis with chronic left ICA occlusion and acute right ICA occlusion s/p R ICA angioplasty with reocclusion and questionable dissection from cough with long-term BP goal 130-150.  LDL 82 and recommended continuation of Crestor 20 mg daily.  Other stroke risk factors include advanced age, former tobacco use, former smokeless tobacco use and EtOH use.  No prior stroke history.  Residual deficits of mild dysarthria, left upper and lower facial weakness, dysphagia, and mild left hemiparesis and discharged to CIR for ongoing therapy needs.  Stroke: R MCA and multiple R and L anterior circulation infarcts s/p tPA and IR w/ partial revascularization with resultant small SAH, most likely secondary to large vessel disease with ? dissection due  to coughing episodes, vs. Hypercoagulable state from parotid neoplasm   CT head dense R M1. Subtle asymmetric hypodensity R caudate and lentiform nucleus. ASPECTS 8   CTA head &neck R ICA LVO at bifurcation origin w/ cavernous reconstitution and reocclusion proximal R M1. Good MCA collaterals. L ICA occlusion at bifurcation origin. Short severe L ICA cavernous stenosis.  Cerebral angio partial revascularization R MCA dominant division w/ TICI2a/TICI2b. S/p angioplasty acute R ICA occlusion w/ reocclusion   Post IR CT mild to mod RT peri-insular and post frontal convexity contrast stain vs SAH   MRI R MCA frontal lobe infarct w/ possible mild petechial hemorrhage. B ICA siphon high-grade stenosis vs occlusion. R MCA SAH. 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 No source of embolus   LDL82   HgbA1c5.6  VTE prophylaxis - SCDs   No antithromboticprior to admission, now onASA alone due to small SAH. Plan to repeat CT on 7/30, if SAH resolves or nearly resoved, will consider DAPT for 3 months.  Therapy recommendations: CIR- consult placed  Disposition: pending- await for CIR input, if not a candidate will d/c home   Today, 05/07/2020, Ethan Fitzgerald is being seen for hospital follow-up accompanied by his wife He was discharged home from [place] on 04/09/2020  Residual deficits left hand numbness with decreased dexterity, left facial weakness, cognitive impairment, right gaze preference and dysarthria  does occasionally have difficulty with swallowing but is currently maintaining a regular diet Continues to work with outpatient therapy  at [facility] with ongoing improvement SLP reported deficits in attention, safety/judgment, awareness, problem solving and memory 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 He does report dizziness and lightheadedness upon standing. Monitors blood pressures at home which has been ranging 110-120/70s over the past week.  No further concerns    ROS:   14 system review of systems performed and negative with exception of see HPI  PMH:  Past Medical History:  Diagnosis Date  . COPD (chronic obstructive pulmonary disease) (HCC)    mild  . COVID-19 09/2019  . Erectile dysfunction   . Headache    daily, stress headaches  . Knee pain, left   . Seasonal allergies   . Tobacco dependence     PSH:  Past Surgical History:  Procedure Laterality Date  . BACK SURGERY     metal plate in back  . COLONOSCOPY    . COLONOSCOPY WITH PROPOFOL N/A 04/26/2019   Procedure: COLONOSCOPY WITH PROPOFOL;  Surgeon: Jonathon Bellows, MD;  Location: [facility] ENDOSCOPY;  Service: Gastroenterology;  Laterality: N/A;  . HERNIA REPAIR Right   . IR ANGIO INTRA EXTRACRAN SEL COM CAROTID INNOMINATE UNI L MOD SED  03/27/2020  . IR ANGIO VERTEBRAL SEL SUBCLAVIAN INNOMINATE UNI L MOD SED  03/27/2020  . IR ANGIO VERTEBRAL SEL VERTEBRAL UNI R MOD SED  03/27/2020  . IR CT HEAD LTD  03/27/2020  . IR PERCUTANEOUS ART THROMBECTOMY/INFUSION INTRACRANIAL INC DIAG ANGIO  03/27/2020  . KNEE ARTHROSCOPY Left 03/28/2015   Procedure: Arthroscopic partial medial meniscectomy plus chondral debridement;  Surgeon: Leanor Kail, MD;  Location: [place];  Service: Orthopedics;  Laterality: Left;  . RADIOLOGY WITH ANESTHESIA N/A 03/27/2020   Procedure: IR WITH ANESTHESIA;  Surgeon: Luanne Bras, MD;  Location: [place];  Service: Radiology;  Laterality: N/A;    Social History:  Social History   Socioeconomic History  . Marital status: Married    Spouse name: Not on file  . Number of children: Not on file  . Years of education: Not on file  . Highest education level: Not on file  Occupational History  . Not on file  Tobacco Use  . Smoking status: Former Smoker    Packs/day: 1.50    Years: 49.00    Pack years: 73.50    Types: Cigarettes    Start date: 1970    Quit date: 09/14/2017    Years since quitting: 2.6  . Smokeless tobacco: Former Network engineer  . Vaping Use: Never used  Substance and Sexual Activity  . Alcohol use: Yes    Comment: 6 pack on weekend  . Drug use: No  . Sexual activity: Not on file  Other Topics Concern  . Not on file  Social History Narrative  . Not on file   Social Determinants of Health   Financial Resource Strain:   . Difficulty of Paying Living Expenses: Not on file  Food Insecurity:   . Worried About Charity fundraiser in the Last Year: Not on file  . Ran Out  of Food in the Last Year: Not on file  Transportation Needs:   . Lack of Transportation (Medical): Not on file  . Lack of Transportation (Non-Medical): Not on file  Physical Activity:   . Days of Exercise per Week: Not on file  . Minutes of Exercise per Session: Not on file  Stress:   . Feeling of Stress : Not on file  Social Connections:   . Frequency of Communication with Friends and Family: Not on file  . Frequency of Social Gatherings with Friends and Family: Not on file  . Attends Religious Services: Not on file  . Active Member of Clubs or Organizations: Not on file  . Attends Archivist Meetings: Not on file  . Marital Status: Not on file  Intimate Partner Violence:   . Fear of Current or Ex-Partner: Not on file  . Emotionally Abused: Not on file  . Physically Abused: Not on file  . Sexually Abused: Not on file    Family History:  Family  History  Problem Relation Age of Onset  . Diabetes Mother   . Heart attack Mother 55  . Cancer Father        liver   . COPD Brother   . HIV/AIDS Brother   . Prostate cancer Neg Hx   . Colon cancer Neg Hx     Medications:   Current Outpatient Medications on File Prior to Visit  Medication Sig Dispense Refill  . albuterol (VENTOLIN HFA) 108 (90 Base) MCG/ACT inhaler Inhale 1-2 puffs into the lungs every 4 (four) hours as needed for wheezing or shortness of breath. 18 g 2  . aspirin EC 325 MG EC tablet Take 1 tablet (325 mg total) by mouth daily. 90 tablet 0  . clopidogrel (PLAVIX) 75 MG tablet Take 1 tablet (75 mg total) by mouth daily. 30 tablet 0  . diclofenac Sodium (VOLTAREN) 1 % GEL Apply 2 g topically 4 (four) times daily. 150 g 1  . fludrocortisone (FLORINEF) 0.1 MG tablet Take 1 tablet (0.1 mg total) by mouth daily. 30 tablet 3  . pantoprazole (PROTONIX) 40 MG tablet Take 1 tablet (40 mg total) by mouth at bedtime. 30 tablet 0  . rosuvastatin (CRESTOR) 20 MG tablet Take 1 tablet (20 mg total) by mouth daily. 30 tablet 0  . Tiotropium Bromide-Olodaterol (STIOLTO RESPIMAT) 2.5-2.5 MCG/ACT AERS Inhale 2 puffs into the lungs daily. 12 g 1  . vitamin C (ASCORBIC ACID) 500 MG tablet Take 500 mg by mouth daily. Am     No current facility-administered medications on file prior to visit.    Allergies:  No Known Allergies    OBJECTIVE:  Physical Exam  Vitals:   05/07/20 1108  BP: 112/88  Pulse: 74  Weight: 168 lb (76.2 kg)  Height: 6' (1.829 m)   Body mass index is 22.78 kg/m. No exam data present  General: well developed, well nourished,  pleasant middle-age Caucasian male, seated, in no evident distress Head: head normocephalic and atraumatic.   Neck: supple with no carotid or supraclavicular bruits Cardiovascular: regular rate and rhythm, no murmurs Musculoskeletal: no deformity Skin:  no rash/petichiae Vascular:  Normal pulses all extremities   Neurologic  Exam Mental Status: Awake and fully alert.   Mild to moderate dysarthria.  Oriented to place and time. Recent and remote memory intact. Attention span, concentration and fund of knowledge appropriate during visit although impaired awareness, safety/judgment and attention per SLP. Mood and affect appropriate.  Cranial Nerves: Fundoscopic  exam reveals sharp disc margins. Pupils equal, briskly reactive to light. Extraocular movements full without nystagmus with right gaze preference but able to cross midline without difficulty.  Visual fields full to confrontation. Hearing intact. Facial sensation intact.  Left lower facial weakness. Motor: Normal bulk and tone. Normal strength in all tested extremity muscles except slightly decreased left hand dexterity. Sensory.: intact to touch , pinprick , position and vibratory sensation.  Coordination: Rapid alternating movements normal in all extremities. Finger-to-nose and heel-to-shin performed accurately bilaterally. Gait and Station: Arises from chair without difficulty. Stance is normal. Gait demonstrates normal stride length and balance Reflexes: 1+ and symmetric. Toes downgoing.     NIHSS 3 Modified Rankin  2-3     ASSESSMENT: Ethan Fitzgerald is a 64 y.o. year old male presented with left-sided weakness on 03/27/2020 with stroke work-up revealing right MCA and multiple right and left anterior circulation infarcts s/p tPA and IR with partial revascularization and s/p angioplasty for acute right ICA occlusion with reocclusion, infarct most likely secondary to large vessel disease with questionable dissection vs hypercoagulable state from parotid neoplasm. Vascular risk factors include parotid mass, carotid stenosis, HLD, former tobacco use and EtOH use.      PLAN:  1. R MCA stroke, B anterior circulation infarcts:  a. Residual deficit: Decreased left hand dexterity with subjective numbness, left facial droop with dysarthria, right gaze preference and  cognitive impairment.  i. Continue to work with outpatient therapy for hopeful ongoing improvement ii. Would not recommend returning to work or driving at this time due to residual deficits iii. Disability currently being completed by PCP or PMR b. Continue aspirin 325 mg daily and clopidogrel 75 mg daily  and Crestor 20 mg daily for secondary stroke prevention.  c. Continue DAPT for additional 2 months and then aspirin alone d. Close PCP follow up for aggressive stroke risk factor management  2. HLD:  a. LDL goal <70. Recent LDL 82.  b. Continue Crestor 20 mg daily c. Managed and prescribed by PCP 3. Hypotension:  a. Currently on Florinef 0.1 mg daily managed by cardiology.   b. Slight improvement of blood pressures but still not at goal of 130-150.  c.  Advised to continue to follow with cardiology for monitoring and management as well as routine monitoring blood pressures at home 4. Carotid stenosis:  a. Needs follow-up visit with Dr. Estanislado Pandy - will attempt to reach out to IR for further assistance 5. Parotid mass:  a. Referral placed to ENT for further evaluation with suspicion for primary neoplasm    Follow up in 3 months or call earlier if needed   I spent 50 minutes of face-to-face and non-face-to-face time with patient and wife.  This included previsit chart review, lab review, study review, order entry, electronic health record documentation, patient education regarding recent stroke and etiology, residual deficits, importance of managing stroke risk factors, parotid mass and further evaluation with ENT and answered all questions to patient and wifes satisfaction     [name], AGNP-BC  [facility] [address] [place], [place] [address]  Phone [phone] Fax [phone] Note: This document was prepared with digital dictation and possible smart phrase technology. Any transcriptional errors that result from this process are  unintentional.

## 2020-05-08 ENCOUNTER — Other Ambulatory Visit: Payer: Self-pay

## 2020-05-08 ENCOUNTER — Ambulatory Visit: Payer: Medicare HMO | Attending: Physical Medicine & Rehabilitation

## 2020-05-08 ENCOUNTER — Telehealth: Payer: Self-pay | Admitting: Family Medicine

## 2020-05-08 DIAGNOSIS — R41841 Cognitive communication deficit: Secondary | ICD-10-CM | POA: Insufficient documentation

## 2020-05-08 DIAGNOSIS — R278 Other lack of coordination: Secondary | ICD-10-CM | POA: Insufficient documentation

## 2020-05-08 DIAGNOSIS — M6281 Muscle weakness (generalized): Secondary | ICD-10-CM

## 2020-05-08 NOTE — Telephone Encounter (Signed)
Patient is requesting medications that have already been pended for refill/review

## 2020-05-08 NOTE — Telephone Encounter (Signed)
clopidogrel (PLAVIX) 75 MG tablet Medication Date: 04/09/2020 Department: [place] A Ordering/Authorizing: Flora Lipps   pantoprazole (PROTONIX) 40 MG tablet Medication Date: 04/09/2020 Department: [place] [facility] Ordering/Authorizing: Bary Leriche, [place] #[id number] Phillip Heal, [place] AT [facility] OF [address] (Ph: [phone])  Order Details  [facility] told him since he has been transferred to the Out PT rehab that his provider will need to reorder these refills. Pt is still doing active rehab and needs this but wants Dr. Raliegh Ip to call in the above as it was explained to him. CB for pt if needed is [phone]

## 2020-05-08 NOTE — Patient Instructions (Signed)
Access Code: [email or web address] URL: [email or web address] Date: 05/08/2020 Prepared by: Janna Arch  Exercises Seated Ankle Dorsiflexion with Resistance - 1 x daily - 7 x weekly - 2 sets - 10 reps - 5 hold Heel Toe Raises with Counter Support - 1 x daily - 7 x weekly - 2 sets - 10 reps - 5 hold Single Leg Stance with Support - 1 x daily - 7 x weekly - 2 sets - 10 reps - 5 hold

## 2020-05-08 NOTE — Therapy (Signed)
[place] MAIN [facility] SERVICES [place], [place], [address] Phone: [phone]   Fax:  [phone]  Physical Therapy Evaluation  Patient Details  Name: Ethan Fitzgerald MRN: [id number] Date of Birth: [birth year] Referring Provider (PT): Charlett Blake MD   Encounter Date: 05/08/2020   PT End of Session - 05/08/20 1238    Visit Number 1    Number of Visits 1    PT Start Time 1100    PT Stop Time 7169    PT Time Calculation (min) 31 min    Equipment Utilized During Treatment Gait belt    Activity Tolerance Patient tolerated treatment well    Behavior During Therapy [facility] for tasks assessed/performed           Past Medical History:  Diagnosis Date   COPD (chronic obstructive pulmonary disease) ([place])    mild   COVID-19 09/2019   Erectile dysfunction    Headache    daily, stress headaches   Knee pain, left    Seasonal allergies    Tobacco dependence     Past Surgical History:  Procedure Laterality Date   BACK SURGERY     metal plate in back   COLONOSCOPY     COLONOSCOPY WITH PROPOFOL N/A 04/26/2019   Procedure: COLONOSCOPY WITH PROPOFOL;  Surgeon: Jonathon Bellows, MD;  Location: [facility] ENDOSCOPY;  Service: Gastroenterology;  Laterality: N/A;   HERNIA REPAIR Right    IR ANGIO INTRA EXTRACRAN SEL COM CAROTID INNOMINATE UNI L MOD SED  03/27/2020   IR ANGIO VERTEBRAL SEL SUBCLAVIAN INNOMINATE UNI L MOD SED  03/27/2020   IR ANGIO VERTEBRAL SEL VERTEBRAL UNI R MOD SED  03/27/2020   IR CT HEAD LTD  03/27/2020   IR PERCUTANEOUS ART THROMBECTOMY/INFUSION INTRACRANIAL INC DIAG ANGIO  03/27/2020   KNEE ARTHROSCOPY Left 03/28/2015   Procedure: Arthroscopic partial medial meniscectomy plus chondral debridement;  Surgeon: Leanor Kail, MD;  Location: [place];  Service: Orthopedics;  Laterality: Left;   RADIOLOGY WITH ANESTHESIA N/A 03/27/2020   Procedure: IR WITH ANESTHESIA;  Surgeon: Luanne Bras, MD;   Location: [place];  Service: Radiology;  Laterality: N/A;    There were no vitals filed for this visit.    Subjective Assessment - 05/08/20 1227    Subjective Patient is a 64 year old male who presents to physical therapy for middle cerebral artery embolism (right).    Pertinent History Patient is a 64 year old male who presents to physical therapy for middle cerebral artery embolism (right). Patient admitted to the hospital on 03/27/20 with L sided weakness, difficulty walking and speaking. CT showed dense M1 segment with occlusive thrombus, occlusion of L ICA distal reconstitution of cavernous segment and good perfusion of L MCA and bilateral ACAs. He received TPA as well as cerebral angio with partial revascularization of R MCA division and attempt of revascularization of proximal R ICA with reocclusion. Follow-up MRI brain showed acute cortical/subcortical right MCA infarct with additional infarcts in right caudate nucleus, right pre and postcentral gyri and mid to anterior frontal lobe with SAH and incidental right parotid neoplasm He was extubated on 03/28/20 and c/o of R shoulder pain (imaging negative). Patient has Left inattention with impulsivity, L sided weakness and balance deficits.  PMH includes COPD, daily  headache, dyslipidemia, bronchitis, COVID 19 (09/2019), Went to CIR and was discharged home CIR 04/09/20. 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 Patient is currently seeing OT and speech.    Limitations --   patient reports only deficits are in L arm and face   How long can you sit comfortably? n/a    How long can you stand comfortably? n/a    How long can you walk comfortably? n/a    Patient Stated Goals don't think I need PT    Currently in Pain? No/denies           Middle cerebral artery embolism, right;   Blood pressure goals of 130-150       [facility] PT Assessment - 05/08/20 0001      Assessment    Medical Diagnosis CVA    Referring Provider (PT) Charlett Blake MD    Onset Date/Surgical Date 03/27/20    Hand Dominance Left    Prior Therapy inpatient rehab      Precautions   Precautions None      Restrictions   Weight Bearing Restrictions No      Balance Screen   Has the patient fallen in the past 6 months No    Has the patient had a decrease in activity level because of a fear of falling?  No    Is the patient reluctant to leave their home because of a fear of falling?  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, loves the [employer]   Overall Cognitive Status Impaired/Different from baseline    Area of Impairment Attention;Safety/judgement;Awareness;Problem solving;Memory      Standardized Balance Assessment   Standardized Balance Assessment Berg Balance Test;Dynamic Gait Index      Berg Balance Test   Sit to Stand Able to stand without using hands and stabilize independently    Standing Unsupported Able to stand safely 2 minutes    Sitting with Back Unsupported but Feet Supported on Floor or Stool Able to sit safely and securely 2 minutes    Stand to Sit Sits safely with minimal use of hands    Transfers Able to transfer safely, minor use of hands    Standing Unsupported with Eyes Closed Able to stand 10 seconds safely    Standing Unsupported with Feet Together Able to place feet together independently and stand 1 minute safely    From Standing, Reach Forward with Outstretched Arm Can reach confidently >25 cm (10")    From Standing Position, Pick up Object from Floor Able to pick up shoe safely and  easily    From Standing Position, Turn to Look Behind Over each Shoulder Looks behind from both sides and weight shifts well    Turn 360 Degrees Able to turn 360 degrees safely in 4 seconds or less    Standing Unsupported, Alternately Place Feet on Step/Stool Able to stand independently and safely and complete 8 steps in 20 seconds    Standing Unsupported, One Foot in Front Able to place foot tandem independently and hold 30 seconds    Standing on One Leg Able to lift leg independently and hold equal to or more than 3 seconds  Total Score 54      Dynamic Gait Index   Level Surface Normal    Change in Gait Speed Mild Impairment    Gait with Horizontal Head Turns Normal    Gait with Vertical Head Turns Normal    Gait and Pivot Turn Normal    Step Over Obstacle Normal    Step Around Obstacles Normal    Steps Normal    Total Score 23            PAIN: Patient reports no pain   POSTURE: Slight trunk lean to L side in seated Upright functional posture in standing  PROM/AROM:  LE's WFL  STRENGTH:  Graded on a 0-5 scale Muscle Group Left Right  Hip Flex 5/5 5/5  Hip Abd 5/5 5/5  Hip Add 5/5 5/5  Hip Ext 5/5 5/5  Hip IR/ER 4/5 5/5  Knee Flex 4/5 5/5  Knee Ext 4+/5 5/5  Ankle DF 4-/5 5/5  Ankle PF 4-/5 5/5   SENSATION:  BUE :  BLE :   NEUROLOGICAL SCREEN: (2+ unless otherwise noted.) N=normal  Ab=abnormal   L2 Medial thigh/groin N N  L3 Lower thigh/med.knee N N  L4 Medial leg/lat thigh N N  L5 Lat. leg & dorsal foot N N  S1 post/lat foot/thigh/leg N N  S2 Post./med. thigh & leg N N    SOMATOSENSORY:  Any N & T in extremities or weakness: reports :         Sensation           Intact      Diminished         Absent  Light touch LEs                              COORDINATION: Finger to Nose: slight deficit of LUE Heel shin test: WFL bilaterally    FUNCTIONAL MOBILITY: STS performed without UE support   BALANCE: Dynamic Sitting Balance  Normal Able to sit  unsupported and weight shift across midline maximally x  Good Able to sit unsupported and weight shift across midline moderately   Good-/Fair+ Able to sit unsupported and weight shift across midline minimally   Fair Minimal weight shifting ipsilateral/front, difficulty crossing midline   Fair- Reach to ipsilateral side and unable to weight shift   Poor + Able to sit unsupported with min A and reach to ipsilateral side, unable to weight shift   Poor Able to sit unsupported with mod A and reach ipsilateral/front-cant cross midline     Standing Dynamic Balance  Normal Stand independently unsupported, able to weight shift and cross midline maximally x  Good Stand independently unsupported, able to weight shift and cross midline moderately   Good-/Fair+ Stand independently unsupported, able to weight shift across midline minimally   Fair Stand independently unsupported, weight shift, and reach ipsilaterally, loss of balance when crossing midline   Poor+ Able to stand with Min A and reach ipsilaterally, unable to weight shift   Poor Able to stand with Mod A and minimally reach ipsilaterally, unable to cross midline.     Static Sitting Balance  Normal Able to maintain balance against maximal resistance x  Good Able to maintain balance against moderate resistance   Good-/Fair+ Accepts minimal resistance   Fair Able to sit unsupported without balance loss and without UE support   Poor+ Able to maintain with Minimal assistance from individual or chair   Poor  Unable to maintain balance-requires mod/max support from individual or chair     Static Standing Balance  Normal Able to maintain standing balance against maximal resistance   Good Able to maintain standing balance against moderate resistance x  Good-/Fair+ Able to maintain standing balance against minimal resistance   Fair Able to stand unsupported without UE support and without LOB for 1-2 min   Fair- Requires Min A and UE support to  maintain standing without loss of balance   Poor+ Requires mod A and UE support to maintain standing without loss of balance   Poor Requires max A and UE support to maintain standing balance without loss       GAIT: Patient ambulates with slight deficit of L foot dorsiflexion that is minimal and only apparent when distracted. Slight limitation of L arm swing. Gait speed and mechanics WFL.   OUTCOME MEASURES: TEST Outcome Interpretation  5 times sit<>stand 13. 9 sec >60 yo, >15 sec indicates increased risk for falls  DGI 23/24           Berg Balance Assessment 54/56 <36/56 (100% risk for falls), 37-45 (80% risk for falls); 46-51 (>50% risk for falls); 52-55 (lower risk <25% of falls)                   Objective measurements completed on examination: See above findings.               PT Education - 05/08/20 1237    Education Details one time visit; HEP    Person(s) Educated Patient    Methods Explanation;Handout    Comprehension Verbalized understanding            PT Short Term Goals - 05/08/20 1243      PT SHORT TERM GOAL #1   Title Patient will be independent and safe with HEP for return to PLOF.    Baseline 9/2: patient given and demonstrates understanding of HEP    Time 1    Period Days    Status Achieved                     Plan - 05/08/20 1241    Clinical Impression Statement Patient is a pleasant 64 year old male who presents to physical therapy s/p CVA. Patient is unsure of reason for PT referral as he is at baseline ambulatory and balance level with primary deficits in L arm and speech which he is already receiving OT and PT for. Patient's balance is above average is DGI 23/24 and BERG 54/56. Patient is given exercises to assist with dorsiflexion of L foot as this is the only deficit found. Patient agreeable with this being a one time PT session as he is busy with his other therapies and does not desire continuation of PT. I will be  happy to see this patient again in the future as needed.    Personal Factors and Comorbidities Age;Comorbidity 3+;Transportation    Comorbidities COPD, SAH, COVID 19, HLD, dysphagia, CVA    Examination-Participation Restrictions Cleaning;Meal Prep;Occupation    Stability/Clinical Decision Making Evolving/Moderate complexity    Clinical Decision Making Moderate    PT Frequency One time visit    Consulted and Agree with Plan of Care Patient;Family member/caregiver    Family Member Consulted wife           Patient will benefit from skilled therapeutic intervention in order to improve the following deficits and impairments:     Visit Diagnosis: Muscle  weakness (generalized)     Problem List Patient Active Problem List   Diagnosis Date Noted   History of cerebrovascular accident (CVA) with residual deficit 04/16/2020   ICAO (internal carotid artery occlusion), left 04/16/2020   Hemiplegia of dominant side following cerebrovascular accident (CVA) ([place]) 04/16/2020   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) 04/16/2020   Parotid mass 03/31/2020   Carotid stenosis 03/31/2020   Arterial hypotension 03/31/2020   Dysphagia due to recent stroke 03/31/2020   COPD without exacerbation ([place]) 03/31/2020   Hypokalemia 03/31/2020   Right shoulder pain 03/31/2020   SAH (subarachnoid hemorrhage) ([place]) secondary to IR 03/31/2020   Stroke (cerebrum) ([place]) - mult B anterior infarcts s/p tPA + R MCA revascularizion. 03/27/2020   Middle cerebral artery embolism, right 03/27/2020   History of COVID-19 12/04/2019   Centrilobular emphysema ([place]) 04/03/2019   Seasonal allergies 02/18/2017   Elevated hemoglobin A1c 02/18/2017   Erectile dysfunction 02/18/2017   Hyperlipidemia 02/18/2017   History of hemorrhoids 02/18/2017   Former smoker 02/18/2017   Chronic low back pain 02/18/2017   Chronic pain of left knee 02/18/2017   Janna Arch, PT,  DPT   05/08/2020, 12:44 PM  [place] MAIN [facility] SERVICES [address] [place], [place], [address] Phone: [phone]   Fax:  [phone]  Name: SCORPIO FORTIN MRN: [id number] Date of Birth: June 26, 1956

## 2020-05-09 ENCOUNTER — Other Ambulatory Visit: Payer: Self-pay | Admitting: Family Medicine

## 2020-05-09 DIAGNOSIS — K219 Gastro-esophageal reflux disease without esophagitis: Secondary | ICD-10-CM

## 2020-05-09 DIAGNOSIS — E782 Mixed hyperlipidemia: Secondary | ICD-10-CM

## 2020-05-09 DIAGNOSIS — I6930 Unspecified sequelae of cerebral infarction: Secondary | ICD-10-CM

## 2020-05-09 NOTE — Telephone Encounter (Signed)
Requested medication (s) are due for refill today: yes  Requested medication (s) are on the active medication list: yes  Last refill: Refill previously filled by another provider  Future visit scheduled: no  Notes to clinic:  Please see telephone encounter from 05/09/20. Refills previously provided by another provider    Requested Prescriptions  Pending Prescriptions Disp Refills   rosuvastatin (CRESTOR) 20 MG tablet [Pharmacy Med Name: ROSUVASTATIN 20MG  TABLETS] 30 tablet 0    Sig: TAKE 1 TABLET(20 MG) BY MOUTH DAILY      Cardiovascular:  Antilipid - Statins Passed - 05/09/2020  3:04 PM      Passed - Total Cholesterol in normal range and within 360 days    Cholesterol  Date Value Ref Range Status  03/27/2020 153 0 - 200 mg/dL Final          Passed - LDL in normal range and within 360 days    LDL Cholesterol (Calc)  Date Value Ref Range Status  10/29/2019  mg/dL (calc) Final    Comment:    . LDL cholesterol not calculated. Triglyceride levels greater than 400 mg/dL invalidate calculated LDL results. . Reference range: <100 . Desirable range <100 mg/dL for primary prevention;   <70 mg/dL for patients with CHD or diabetic patients  with > or = 2 CHD risk factors. Marland Kitchen LDL-C is now calculated using the Martin-Hopkins  calculation, which is a validated novel method providing  better accuracy than the Friedewald equation in the  estimation of LDL-C.  Cresenciano Genre et al. Annamaria Helling. 9233;007(62): 2061-2068  (http://education.[employer].com/faq/FAQ164)    LDL Cholesterol  Date Value Ref Range Status  03/27/2020 82 0 - 99 mg/dL Final    Comment:           Total Cholesterol/HDL:CHD Risk Coronary Heart Disease Risk Table                     Men   Women  1/2 Average Risk   3.4   3.3  Average Risk       5.0   4.4  2 X Average Risk   9.6   7.1  3 X Average Risk  23.4   11.0        Use the calculated Patient Ratio above and the CHD Risk Table to determine the patient's CHD  Risk.        ATP III CLASSIFICATION (LDL):  <100     mg/dL   Optimal  100-129  mg/dL   Near or Above                    Optimal  130-159  mg/dL   Borderline  160-189  mg/dL   High  >190     mg/dL   Very High Performed at [facility] [address] [place], [place] [address]           Passed - HDL in normal range and within 360 days    HDL  Date Value Ref Range Status  03/27/2020 48 >40 mg/dL Final          Passed - Triglycerides in normal range and within 360 days    Triglycerides  Date Value Ref Range Status  03/27/2020 115 <150 mg/dL Final          Passed - Patient is not pregnant      Passed - Valid encounter within last 12 months    Recent Outpatient Visits  3 weeks ago History of cerebrovascular accident (CVA) with residual deficit   [place], DO   1 month ago Acute bronchitis with COPD [facility])   [facility] [place], Devonne Doughty, DO   4 months ago Elevated hemoglobin A1c   [facility] Olin Hauser, DO   6 months ago Mixed hyperlipidemia   [place], DO   1 year ago Acute right-sided low back pain with right-sided sciatica   [place], DO                pantoprazole ([place]) 40 MG tablet [Pharmacy Med Name: PANTOPRAZOLE 40MG  TABLETS] 30 tablet 0    Sig: TAKE 1 TABLET(40 MG) BY MOUTH AT BEDTIME      Gastroenterology: Proton Pump Inhibitors Passed - 05/09/2020  3:04 PM      Passed - Valid encounter within last 12 months    Recent Outpatient Visits           3 weeks ago History of cerebrovascular accident (CVA) with residual deficit   [place], DO   1 month ago Acute bronchitis with COPD [facility])   [facility], Devonne Doughty, DO   4 months ago Elevated hemoglobin A1c   [facility] Olin Hauser, DO   6 months ago Mixed hyperlipidemia   [place], DO   1 year ago Acute right-sided low back pain with right-sided sciatica   [place], DO                clopidogrel (PLAVIX) 75 MG tablet [Pharmacy Med Name: CLOPIDOGREL 75MG  TABLETS] 30 tablet 0    Sig: TAKE 1 TABLET(75 MG) BY MOUTH DAILY      Hematology: Antiplatelets - clopidogrel Failed - 05/09/2020  3:04 PM      Failed - Evaluate AST, ALT within 2 months of therapy initiation.      Passed - ALT in normal range and within 360 days    ALT  Date Value Ref Range Status  04/02/2020 26 0 - 44 U/L Final          Passed - AST in normal range and within 360 days    AST  Date Value Ref Range Status  04/02/2020 24 15 - 41 U/L Final          Passed - HCT in normal range and within 180 days    HCT  Date Value Ref Range Status  04/16/2020 46.6 39 - 52 % Final          Passed - HGB in normal range and within 180 days    Hemoglobin  Date Value Ref Range Status  04/16/2020 15.7 13.0 - 17.0 g/dL Final          Passed - PLT in normal range and within 180 days    Platelets  Date Value Ref Range Status  04/16/2020 337 150 - 400 K/uL Final          Passed - Valid encounter within last 6 months    Recent Outpatient Visits           3 weeks ago History of cerebrovascular accident (CVA) with residual deficit   [place], DO   1 month ago Acute bronchitis with COPD ([place])  [place], DO   4 months ago Elevated hemoglobin A1c   [place], DO   6 months ago Mixed hyperlipidemia   [place], DO   1 year ago Acute right-sided low back pain with right-sided sciatica   [place], [place]

## 2020-05-09 NOTE — Telephone Encounter (Signed)
Signed the pended orders.  [name], [facility] 05/09/2020, 6:06 PM

## 2020-05-13 ENCOUNTER — Ambulatory Visit: Payer: Medicare HMO | Admitting: Occupational Therapy

## 2020-05-13 ENCOUNTER — Ambulatory Visit: Payer: Medicare HMO | Admitting: Speech Pathology

## 2020-05-13 ENCOUNTER — Encounter: Payer: Self-pay | Admitting: Occupational Therapy

## 2020-05-13 ENCOUNTER — Other Ambulatory Visit: Payer: Self-pay

## 2020-05-13 DIAGNOSIS — M6281 Muscle weakness (generalized): Secondary | ICD-10-CM | POA: Diagnosis not present

## 2020-05-13 DIAGNOSIS — R41841 Cognitive communication deficit: Secondary | ICD-10-CM

## 2020-05-13 DIAGNOSIS — R278 Other lack of coordination: Secondary | ICD-10-CM

## 2020-05-13 NOTE — Therapy (Signed)
[place] MAIN [facility] SERVICES [address] [place], [place], [address] Phone: [phone]   Fax:  [phone]  Occupational Therapy Treatment  Patient Details  Name: Ethan Fitzgerald MRN: [id number] Date of Birth: 02-06-1956 Referring Provider (OT): Kirsteins   Encounter Date: 05/13/2020   OT End of Session - 05/13/20 1506    Visit Number 6    Number of Visits 24    Date for OT Re-Evaluation 07/08/20    OT Start Time 1408    OT Stop Time 1450    OT Time Calculation (min) 42 min    Activity Tolerance Patient tolerated treatment well    Behavior During Therapy [facility] for tasks assessed/performed           Past Medical History:  Diagnosis Date  . COPD (chronic obstructive pulmonary disease) (HCC)    mild  . COVID-19 09/2019  . Erectile dysfunction   . Headache    daily, stress headaches  . Knee pain, left   . Seasonal allergies   . Tobacco dependence     Past Surgical History:  Procedure Laterality Date  . BACK SURGERY     metal plate in back  . COLONOSCOPY    . COLONOSCOPY WITH PROPOFOL N/A 04/26/2019   Procedure: COLONOSCOPY WITH PROPOFOL;  Surgeon: Jonathon Bellows, MD;  Location: [facility] ENDOSCOPY;  Service: Gastroenterology;  Laterality: N/A;  . HERNIA REPAIR Right   . IR ANGIO INTRA EXTRACRAN SEL COM CAROTID INNOMINATE UNI L MOD SED  03/27/2020  . IR ANGIO VERTEBRAL SEL SUBCLAVIAN INNOMINATE UNI L MOD SED  03/27/2020  . IR ANGIO VERTEBRAL SEL VERTEBRAL UNI R MOD SED  03/27/2020  . IR CT HEAD LTD  03/27/2020  . IR PERCUTANEOUS ART THROMBECTOMY/INFUSION INTRACRANIAL INC DIAG ANGIO  03/27/2020  . KNEE ARTHROSCOPY Left 03/28/2015   Procedure: Arthroscopic partial medial meniscectomy plus chondral debridement;  Surgeon: Leanor Kail, MD;  Location: [place];  Service: Orthopedics;  Laterality: Left;  . RADIOLOGY WITH ANESTHESIA N/A 03/27/2020   Procedure: IR WITH ANESTHESIA;  Surgeon: Luanne Bras, MD;  Location: [place];   Service: Radiology;  Laterality: N/A;    There were no vitals filed for this visit.   Subjective Assessment - 05/13/20 1505    Subjective  Pt. reports that he is in the middle of renovating his bathroom.    Patient is accompanied by: Family member    Pertinent History Ethan Fitzgerald is a 64 y.o. left-handed male with history of COPD, daily headaches, dyslipidemia increasing coughing episodes was admitted 03/27/2020 with left-sided weakness, difficulty walking and difficulty speaking.  CT head showed dense M1 segment with occlusive thrombus, occlusion of left ICA age-indeterminate with distal reconstitution of cavernous segment and good perfusion of left MCA and bilateral ACAs.  He received TPA and underwent cerebral angio with partial revascularization of right-MCA dominant division with unsuccessful attempts at revascularization of proximal right ICA.  Follow-up MRI brain showed acute cortical/subcortical right MCA infarct with additional infarcts in right caudate nucleus, right pre and postcentral gyri and mid to anterior frontal lobe with SAH and incidental right parotid neoplasm.    Patient Stated Goals Patient would like to be able to work again, needs to be able to drive to return.    Currently in Pain? No/denies          OT TREATMENT    Neuro muscular re-education:  Pt. worked on [facility] skills manipulating nuts, and bolts on a bolt board. Pt. worked  on screwing, and unscrewing nuts, and bolts of varying sizes, and challenging progressively smaller items while working with his dominant left hand with his vision occluded.  Pt. worked on placing, and removing the nuts from a magnetic dish placed at various heights, and angles using the left hand with 100% accuracy. Pt. was able to dual task which included attending to the task while talking, and carrying on a conversation.   Pt. was able to remove the nuts from the bolts with his left hand, however pt. had difficulty applying the nuts to the  bolts with his left hand with his vision occluded.  Pt. had progressively more difficulty with the smaller sized nuts. Pt. was able to locate the targets accurately on the left to place with retrieve the appropriate sized connector nuts. Pt. continues to work on improving LUE strength, [facility] skills, and left sided visual awareness in order to improve ADL functioning, IADL functioning, and maximize overall independence.                        OT Education - 05/13/20 1506    Education Details finger strengthening, fmc    Person(s) Educated Patient    Methods Explanation;Demonstration    Comprehension Verbalized understanding;Returned demonstration               OT Long Term Goals - 04/18/20 1400      OT LONG TERM GOAL #1   Title Patient will be independent with home exercise program for strengthening and coordination    Baseline will need modifications to current HEP as he progresses    Time 12    Period Weeks    Status New    Target Date 07/08/20      OT LONG TERM GOAL #2   Title Patient will complete homemaking tasks of vacuuming and sweeping with modified independence and good balance    Baseline difficulty at eval    Time 6    Period Weeks    Status New    Target Date 05/29/20      OT LONG TERM GOAL #3   Title Patient will improve left shoulder flexion by 20 degrees to assist with placing items in and out of the closet.    Baseline left shoulder flex 118    Time 6    Period Weeks    Status New    Target Date 05/29/20      OT LONG TERM GOAL #4   Title Pt will improve left UE strength by 1 mm grade to assist with performing household maintenance tasks.    Baseline 4/5 left UE    Time 6    Period Weeks    Status New    Target Date 05/29/20      OT LONG TERM GOAL #5   Title Patient will complete tub/shower transfer with modified independence and good safety    Baseline supervision    Time 6    Period Weeks    Status New    Target Date 05/29/20       Long Term Additional Goals   Additional Long Term Goals Yes      OT LONG TERM GOAL #6   Title Patient will perform meal preparation with modified independence.    Baseline unable at eval    Time 12    Period Weeks    Status New    Target Date 07/08/20      OT LONG TERM GOAL #7  Title Pt will demonstrate ability to use non power tools for home maintenance tasks with good safety awareness and coordination.    Baseline difficulty at eval    Time 12    Period Weeks    Status New    Target Date 07/08/20      OT LONG TERM GOAL #8   Title Patient will increase FOTO score to equal to or greater than 76  to demonstrate statistically significant improvement in ADL/IADLs and quality of life.    Baseline FOTO at eval 65    Time 12    Period Weeks    Status New    Target Date 07/08/20                 Plan - 05/13/20 1507    Clinical Impression Statement Pt. was able to remove the nuts from the bolts with his left hand, however pt. had difficulty applying the nuts to the bolts with his left hand with his vision occluded.  Pt. had progressively more difficulty with the smaller sized nuts. Pt. was able to locate the targets accurately on the left to place with retrieve the appropriate sized connector nuts. Pt. continues to work on improving LUE strength, [facility] skills, and left sided visual awareness in order to improve ADL functioning, IADL functioning, and maximize overall independence.   OT Occupational Profile and History Detailed Assessment- Review of Records and additional review of physical, cognitive, psychosocial history related to current functional performance    Occupational performance deficits (Please refer to evaluation for details): ADL's;IADL's;Work;Leisure    Body Structure / Function / Physical Skills ADL;Dexterity;ROM;Strength;Vision;Balance;Coordination;FMC;IADL;Pain;UE functional use;GMC    Psychosocial Skills Environmental  Adaptations;Routines and Behaviors;Habits     Rehab Potential Good    Clinical Decision Making Limited treatment options, no task modification necessary    Comorbidities Affecting Occupational Performance: May have comorbidities impacting occupational performance    Modification or Assistance to Complete Evaluation  No modification of tasks or assist necessary to complete eval    OT Frequency 2x / week    OT Duration 12 weeks    OT Treatment/Interventions Self-care/ADL training;Cryotherapy;Therapeutic exercise;DME and/or AE instruction;Functional Mobility Training;Balance training;Neuromuscular education;Manual Therapy;Visual/perceptual remediation/compensation;Moist Heat;Contrast Bath;Therapeutic activities;Patient/family education    OT Home Exercise Plan L UE ROM of digits/wrist    Consulted and Agree with Plan of Care Patient           Patient will benefit from skilled therapeutic intervention in order to improve the following deficits and impairments:   Body Structure / Function / Physical Skills: ADL, Dexterity, ROM, Strength, Vision, Balance, Coordination, FMC, IADL, Pain, UE functional use, GMC   Psychosocial Skills: Environmental  Adaptations, Routines and Behaviors, Habits   Visit Diagnosis: Muscle weakness (generalized)    Problem List Patient Active Problem List   Diagnosis Date Noted  . History of cerebrovascular accident (CVA) with residual deficit 04/16/2020  . ICAO (internal carotid artery occlusion), left 04/16/2020  . Hemiplegia of dominant side following cerebrovascular accident (CVA) ([place]) 04/16/2020  . 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 Parotid mass 03/31/2020  . Carotid stenosis 03/31/2020  . Arterial hypotension 03/31/2020  . Dysphagia due to recent stroke 03/31/2020  . COPD without exacerbation ([place]) 03/31/2020  . Hypokalemia 03/31/2020  . Right shoulder pain 03/31/2020  . SAH (subarachnoid hemorrhage) ([place]) secondary to IR 03/31/2020  . Stroke  (cerebrum) (HCC) - mult B anterior infarcts s/p tPA + R MCA revascularizion. 03/27/2020  . Middle cerebral  artery embolism, right 03/27/2020  . History of COVID-19 12/04/2019  . Centrilobular emphysema ([place]) 04/03/2019  . Seasonal allergies 02/18/2017  . Elevated hemoglobin A1c 02/18/2017  . Erectile dysfunction 02/18/2017  . Hyperlipidemia 02/18/2017  . History of hemorrhoids 02/18/2017  . Former smoker 02/18/2017  . Chronic low back pain 02/18/2017  . Chronic pain of left knee 02/18/2017    Harrel Carina, MS, OTR/L 05/13/2020, 3:09 PM  [place] MAIN [facility] SERVICES [address] [place], [place], [address] Phone: [phone]   Fax:  [phone]  Name: Ethan Fitzgerald MRN: [id number] Date of Birth: March 27, 1956

## 2020-05-14 ENCOUNTER — Encounter: Payer: Self-pay | Admitting: Speech Pathology

## 2020-05-14 NOTE — Therapy (Signed)
[place] MAIN [facility] SERVICES [address] [place], [place], [address] Phone: [phone]   Fax:  [phone]  Speech Language Pathology Treatment  Patient Details  Name: Ethan Fitzgerald MRN: [id number] Date of Birth: 1956/04/04 Referring Provider (SLP): Alysia Penna   Encounter Date: 05/13/2020   End of Session - 05/14/20 1753    Visit Number 4    Authorization Type [employer]    Authorization Time Period 04/22/2020 thru 07/16/2020    Authorization - Visit Number 3    SLP Start Time 1540    SLP Stop Time  1400    SLP Time Calculation (min) 55 min    Activity Tolerance Patient tolerated treatment well           Past Medical History:  Diagnosis Date  . COPD (chronic obstructive pulmonary disease) (HCC)    mild  . COVID-19 09/2019  . Erectile dysfunction   . Headache    daily, stress headaches  . Knee pain, left   . Seasonal allergies   . Tobacco dependence     Past Surgical History:  Procedure Laterality Date  . BACK SURGERY     metal plate in back  . COLONOSCOPY    . COLONOSCOPY WITH PROPOFOL N/A 04/26/2019   Procedure: COLONOSCOPY WITH PROPOFOL;  Surgeon: Jonathon Bellows, MD;  Location: [facility] ENDOSCOPY;  Service: Gastroenterology;  Laterality: N/A;  . HERNIA REPAIR Right   . IR ANGIO INTRA EXTRACRAN SEL COM CAROTID INNOMINATE UNI L MOD SED  03/27/2020  . IR ANGIO VERTEBRAL SEL SUBCLAVIAN INNOMINATE UNI L MOD SED  03/27/2020  . IR ANGIO VERTEBRAL SEL VERTEBRAL UNI R MOD SED  03/27/2020  . IR CT HEAD LTD  03/27/2020  . IR PERCUTANEOUS ART THROMBECTOMY/INFUSION INTRACRANIAL INC DIAG ANGIO  03/27/2020  . KNEE ARTHROSCOPY Left 03/28/2015   Procedure: Arthroscopic partial medial meniscectomy plus chondral debridement;  Surgeon: Leanor Kail, MD;  Location: [place];  Service: Orthopedics;  Laterality: Left;  . RADIOLOGY WITH ANESTHESIA N/A 03/27/2020   Procedure: IR WITH ANESTHESIA;  Surgeon: Luanne Bras, MD;  Location:  [place];  Service: Radiology;  Laterality: N/A;    There were no vitals filed for this visit.   Subjective Assessment - 05/14/20 1750    Subjective Pt pleasant, eager, increased insight into physical deficits    Currently in Pain? No/denies                 ADULT SLP TREATMENT - 05/14/20 0001      General Information   Behavior/Cognition Alert;Cooperative;Pleasant mood    HPI Ethan Fitzgerald is a 64 y.o. left-handed male with history of COPD, daily headaches, dyslipidemia increasing coughing episodes was admitted 03/27/2020 with left-sided weakness, difficulty walking and difficulty speaking.  CT head showed dense M1 segment with occlusive thrombus, occlusion of left ICA age-indeterminate with distal reconstitution of cavernous segment and good perfusion of left MCA and bilateral ACAs.  He received TPA and underwent cerebral angio with partial revascularization of right-MCA dominant division with unsuccessful attempts at revascularization of proximal right ICA.  Follow-up MRI brain showed acute cortical/subcortical right MCA infarct with additional infarcts in right caudate nucleus, right pre and postcentral gyri and mid to anterior frontal lobe with SAH and incidental right parotid neoplasm.       Treatment Provided   Treatment provided Cognitive-Linquistic      Pain Assessment   Pain Assessment No/denies pain      Cognitive-Linquistic Treatment   Treatment focused  on Cognition    Skilled Treatment Moderately independent with all executive functions, independent with speech intelligibility strategies at the conversation level, increased insight into left inattention, physical deficits and improved safety awareness      Assessment / Recommendations / [place] with current plan of care      Progression Toward Goals   Progression toward goals Progressing toward goals            SLP Education - 05/14/20 1752    Education Details provided on left neglect and impact on  driving    Person(s) Educated Patient    Methods Explanation;Demonstration    Comprehension Verbalized understanding;Returned demonstration              SLP Long Term Goals - 05/14/20 1756      SLP LONG TERM GOAL #1   Title Patient will identify cognitive-communication barriers and participate in developing functional compensatory strategies.    Baseline new goal    Status Achieved      SLP LONG TERM GOAL #2   Title Patient will demonstrate functional cognitive-communication skills for successful return to work.    Status Achieved      SLP LONG TERM GOAL #3   Title Pt will improve cognitive linguistic function in all communicative contexts.    Status Achieved            Plan - 05/14/20 1753    Clinical Impression Statement Pt has demonstrated great progress over the course of Outpatient Speech Therapy. As such he has met all of his LTGs and is currently moderately independent to independent with all higher level cognitive functions. He has also demonstrated improved safety awareness and improved insight into left inattention. Additionally, pt is independent with speech intelligibility strategies and his complex conversational speech is > 95% intelligible. At this time, skilled ST intervention is no longer indicated.    SLP Home Exercise Plan provided    Consulted and Agree with Plan of Care Patient          Problem List Patient Active Problem List   Diagnosis Date Noted  . History of cerebrovascular accident (CVA) with residual deficit 04/16/2020  . ICAO (internal carotid artery occlusion), left 04/16/2020  . Hemiplegia of dominant side following cerebrovascular accident (CVA) ([place]) 04/16/2020  . 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 Parotid mass 03/31/2020  . Carotid stenosis 03/31/2020  . Arterial hypotension 03/31/2020  . Dysphagia due to recent stroke 03/31/2020  . COPD without exacerbation ([place]) 03/31/2020  .  Hypokalemia 03/31/2020  . Right shoulder pain 03/31/2020  . SAH (subarachnoid hemorrhage) ([place]) secondary to IR 03/31/2020  . Stroke (cerebrum) (HCC) - mult B anterior infarcts s/p tPA + R MCA revascularizion. 03/27/2020  . Middle cerebral artery embolism, right 03/27/2020  . History of COVID-19 12/04/2019  . Centrilobular emphysema ([place]) 04/03/2019  . Seasonal allergies 02/18/2017  . Elevated hemoglobin A1c 02/18/2017  . Erectile dysfunction 02/18/2017  . Hyperlipidemia 02/18/2017  . History of hemorrhoids 02/18/2017  . Former smoker 02/18/2017  . Chronic low back pain 02/18/2017  . Chronic pain of left knee 02/18/2017   Foy Vanduyne B. Rutherford Nail M.S., CCC-SLP, [place] Pathologist Rehabilitation Services Office [phone]   Stormy Fabian 05/14/2020, 5:56 PM  [place] MAIN [facility] SERVICES [address] [place], [place], [address] Phone: [phone]   Fax:  [phone]   Name: KARANDEEP RESENDE MRN: [id number] Date of Birth: 1956-02-07

## 2020-05-15 ENCOUNTER — Other Ambulatory Visit: Payer: Self-pay | Admitting: Family Medicine

## 2020-05-15 ENCOUNTER — Ambulatory Visit: Payer: Medicare HMO | Admitting: Speech Pathology

## 2020-05-15 ENCOUNTER — Ambulatory Visit: Payer: Medicare HMO | Admitting: Physical Therapy

## 2020-05-15 DIAGNOSIS — E782 Mixed hyperlipidemia: Secondary | ICD-10-CM

## 2020-05-16 ENCOUNTER — Other Ambulatory Visit: Payer: Self-pay | Admitting: Family Medicine

## 2020-05-16 ENCOUNTER — Ambulatory Visit (INDEPENDENT_AMBULATORY_CARE_PROVIDER_SITE_OTHER): Payer: Medicare HMO | Admitting: Pharmacist

## 2020-05-16 DIAGNOSIS — I6930 Unspecified sequelae of cerebral infarction: Secondary | ICD-10-CM

## 2020-05-16 DIAGNOSIS — K219 Gastro-esophageal reflux disease without esophagitis: Secondary | ICD-10-CM

## 2020-05-16 DIAGNOSIS — E782 Mixed hyperlipidemia: Secondary | ICD-10-CM

## 2020-05-16 DIAGNOSIS — J432 Centrilobular emphysema: Secondary | ICD-10-CM

## 2020-05-16 DIAGNOSIS — I9589 Other hypotension: Secondary | ICD-10-CM

## 2020-05-16 MED ORDER — CLOPIDOGREL BISULFATE 75 MG PO TABS: 75.0000 mg | ORAL_TABLET | Freq: Every day | ORAL | 3 refills | Status: DC

## 2020-05-16 MED ORDER — PANTOPRAZOLE SODIUM 40 MG PO TBEC: 40.0000 mg | DELAYED_RELEASE_TABLET | Freq: Every day | ORAL | 3 refills | Status: DC

## 2020-05-16 MED ORDER — ROSUVASTATIN CALCIUM 40 MG PO TABS: 40.0000 mg | ORAL_TABLET | Freq: Every day | ORAL | 3 refills | Status: DC

## 2020-05-16 NOTE — Chronic Care Management (AMB) (Signed)
Chronic Care Management   Follow Up Note   05/16/2020 Name: Ethan Fitzgerald MRN: [id number] DOB: [birth year]  Referred by: Olin Hauser, DO Reason for referral : Chronic Care Management (Patient Phone Call)   Ethan Fitzgerald is a 64 y.o. year old male who is a primary care patient of Olin Hauser, DO. The CCM team was consulted for assistance with chronic disease management and care coordination needs.    I reached out to Ethan Fitzgerald and his wife by phone today.   Review of patient status, including review of consultants reports, relevant laboratory and other test results, and collaboration with appropriate care team members and the patient's provider was performed as part of comprehensive patient evaluation and provision of chronic care management services.    SDOH (Social Determinants of Health) assessments performed: No See Care Plan activities for detailed interventions related to SDOH)    Objective  BP Readings from Last 3 Encounters:  05/07/20 112/88  04/22/20 91/64  04/21/20 90/60    Lab Results  Component Value Date   CHOL 153 03/27/2020   HDL 48 03/27/2020   LDLCALC 82 03/27/2020   TRIG 115 03/27/2020   CHOLHDL 3.2 03/27/2020    Lab Results  Component Value Date   CREATININE 1.03 04/16/2020   BUN 23 04/16/2020   NA 140 04/16/2020   K 4.3 04/16/2020   CL 106 04/16/2020   CO2 24 04/16/2020     Outpatient Encounter Medications as of 05/16/2020  Medication Sig  . albuterol (VENTOLIN HFA) 108 (90 Base) MCG/ACT inhaler Inhale 1-2 puffs into the lungs every 4 (four) hours as needed for wheezing or shortness of breath.  Marland Kitchen aspirin EC 325 MG EC tablet Take 1 tablet (325 mg total) by mouth daily.  . clopidogrel (PLAVIX) 75 MG tablet Take 1 tablet (75 mg total) by mouth daily.  . diclofenac Sodium (VOLTAREN) 1 % GEL Apply 2 g topically 4 (four) times daily.  . fludrocortisone (FLORINEF) 0.1 MG tablet Take 1 tablet (0.1 mg total) by mouth  daily.  . pantoprazole (PROTONIX) 40 MG tablet Take 1 tablet (40 mg total) by mouth daily before supper.  . rosuvastatin (CRESTOR) 20 MG tablet Take 1 tablet (20 mg total) by mouth at bedtime.  . Tiotropium Bromide-Olodaterol (STIOLTO RESPIMAT) 2.5-2.5 MCG/ACT AERS Inhale 2 puffs into the lungs daily.  . vitamin C (ASCORBIC ACID) 500 MG tablet Take 500 mg by mouth daily. Am   No facility-administered encounter medications on file as of 05/16/2020.    Goals Addressed              This Visit's Progress   .  PharmD - "I can't afford my inhaler" (pt-stated)        CARE PLAN ENTRY (see longtitudinal plan of care for additional care plan information)  Current Barriers:  . Financial Barriers in complicated patient with multiple medical conditions including COPD, HLD, s/p CVA in 03/2020; patient has [employer] Medicare Part D insurance and reports copay for Anoro inhaler is cost prohibitive at this time  Pharmacist Clinical Goal(s):  Marland Kitchen Over the next 30 days, patient will work with PharmD and providers to relieve medication access concerns  Interventions: . Perform chart review o Seen by Cardiologist on 8/16 for hypotension - Patient advised to: Start Florinef 0.1 mg every other day with slow titration up to daily if needed. Goal pressure >110 o Patient seen by Neurologist on 9/1 for follow up s/p stroke in 03/2020. Patient advised  to: - Continue aspirin 325 mg daily and clopidogrel 75 mg daily and Crestor 20 mg daily for secondary stroke prevention (LDL goal <70).  - Continue DAPT for additional 2 months and then aspirin alone . Comprehensive medication review performed; medication list updated in electronic medical record o Note patient currently taking rosuvastatin 20 mg daily o Latest LDL above goal of <70 mg/dL o Lab Results o  Component o Value o Date o   o LDLCALC o 82 o 03/27/2020   o Will recommend dose increase of rosuvastatin to 40 mg daily . Discuss importance of blood pressure  monitoring o Reports patient taking: fludrocortisone 0.1 mg daily as directed by Cardiologist o Reports also staying hydrated and not limiting salt as directed o Reports checking home BP and keeping log of results: - Today: 108/70, HR 74 - Yesterday 122/82, HR 72 o Encourage patient/wife to continue to monitor at home, keep log of results and follow up with provider for readings outside of established parameters . Discuss medication assistance options o Report have recently switched to [facility] mail order for cost savings - Note Rxs from Pulmonologist and Cardiologist have been sent to mail order - Will request PCP send patient's other Rxs to [facility] mail order o Based on reported current income, patient does not meet income limit for [employer] patient assistance program from [place] has been able to afford copayment for inhaler at this time. Has recently received 90 day supply - Plan to follow up at end of calendar year to reassess medication assistance options . Counsel on importance of medication adherence o Report using weekly pillbox tool as filled by wife to aid with adherence. o Counsel on importance of using maintenance inhaler (Stiolto) daily as directed and rescue inhaler (albuterol) as needed.  o Confirm albuterol inhaler in date. Review administration information for inhaler regarding priming as needed  . Coordination of care o Encourage patient/caregiver to follow up with Neurologist for questions from latest visit AVS o Reports has scheduled ENT appointment as recommended for evaluation of parotid lesion o Reports will call PCP office to reschedule missed appointment . Will collaborate with PCP o Request Rxs sent to [facility] mail order for cost savings o Discuss lipid management; recommend dose increase of rosuvastatin to 40 mg daily to target LDL goal <70 mg/dL  Patient Self Care Activities:  . Patient to attend scheduled medical appointments o Appointment  with ENT on 9/15 o Next appointment with Neurology on 09/02/20  Please see past updates related to this goal by clicking on the "Past Updates" button in the selected goal         Plan  Telephone follow up appointment with care management team member scheduled for: 10/15 at 9 am  Harlow Asa, PharmD, [place] [phone]

## 2020-05-16 NOTE — Patient Instructions (Signed)
Thank you allowing the Chronic Care Management Team to be a part of your care! It was a pleasure speaking with you today!     CCM (Chronic Care Management) Team    Noreene Larsson RN, MSN, CCM Nurse Care Coordinator  [phone]   Harlow Asa PharmD  Clinical Pharmacist  [phone]   Eula Fried LCSW Clinical Social Worker [phone]  Visit Information  Goals Addressed              This Visit's Progress   .  PharmD - "I can't afford my inhaler" (pt-stated)        CARE PLAN ENTRY (see longtitudinal plan of care for additional care plan information)  Current Barriers:  . Financial Barriers in complicated patient with multiple medical conditions including COPD, HLD, s/p CVA in 03/2020; patient has [employer] Medicare Part D insurance and reports copay for Anoro inhaler is cost prohibitive at this time  Pharmacist Clinical Goal(s):  Marland Kitchen Over the next 30 days, patient will work with PharmD and providers to relieve medication access concerns  Interventions: . Perform chart review . Comprehensive medication review performed; medication list updated in electronic medical record o Note patient currently taking rosuvastatin 20 mg daily o Latest LDL above goal of <70 mg/dL o Lab Results o  Component o Value o Date o   o LDLCALC o 82 o 03/27/2020   o Will recommend dose increase of rosuvastatin to 40 mg daily . Discuss importance of blood pressure monitoring o Reports patient taking: fludrocortisone 0.1 mg daily as directed by Cardiologist o Reports also staying hydrated and not limiting salt as directed o Reports checking home BP and keeping log of results: - Today: 108/70, HR 74 - Yesterday 122/82, HR 72 o Encourage patient/wife to continue to monitor at home, keep log of results and follow up with provider for readings outside of established parameters . Discuss medication assistance options o Report have recently switched to [facility] mail order for cost savings - Note  Rxs from Pulmonologist and Cardiologist have been sent to mail order - Will request PCP send patient's other Rxs to [facility] mail order o Based on reported current income, patient does not meet income limit for [employer] patient assistance program from [place] has been able to afford copayment for inhaler at this time. Has recently received 90 day supply - Plan to follow up at end of calendar year to reassess medication assistance options . Counsel on importance of medication adherence o Report using weekly pillbox tool as filled by wife to aid with adherence. o Counsel on importance of using maintenance inhaler (Stiolto) daily as directed and rescue inhaler (albuterol) as needed.  o Confirm albuterol inhaler in date. Review administration information for inhaler regarding priming as needed  . Coordination of care o Encourage patient/caregiver to follow up with Neurologist for questions from latest visit AVS o Reports has scheduled ENT appointment as recommended for evaluation of parotid lesion o Reports will call PCP office to reschedule missed appointment . Will collaborate with PCP o Request Rxs sent to [facility] mail order for cost savings o Discuss lipid management; recommend dose increase of rosuvastatin to 40 mg daily to target LDL goal <70 mg/dL  Patient Self Care Activities:  . Patient to attend scheduled medical appointments o Appointment with ENT on 9/15 o Next appointment with Neurology on 09/02/20  Please see past updates related to this goal by clicking on the "Past Updates" button in the selected goal  Patient verbalizes understanding of instructions provided today.   Telephone follow up appointment with care management team member scheduled for: 10/15 at 58 am  Harlow Asa, PharmD, [place] [phone]

## 2020-05-19 ENCOUNTER — Telehealth: Payer: Self-pay | Admitting: Adult Health

## 2020-05-19 ENCOUNTER — Ambulatory Visit: Payer: Self-pay | Admitting: Pharmacist

## 2020-05-19 DIAGNOSIS — E782 Mixed hyperlipidemia: Secondary | ICD-10-CM

## 2020-05-19 DIAGNOSIS — J432 Centrilobular emphysema: Secondary | ICD-10-CM | POA: Diagnosis not present

## 2020-05-19 DIAGNOSIS — I6930 Unspecified sequelae of cerebral infarction: Secondary | ICD-10-CM

## 2020-05-19 NOTE — Chronic Care Management (AMB) (Signed)
Chronic Care Management   Follow Up Note   05/19/2020 Name: Ethan Fitzgerald MRN: [id number] DOB: 28-Oct-1955  Referred by: Olin Hauser, DO Reason for referral : Chronic Care Management (Patient Phone Call)   Ethan Fitzgerald is a 64 y.o. year old male who is a primary care patient of Olin Hauser, DO. The CCM team was consulted for assistance with chronic disease management and care coordination needs.    I reached out to Janene Harvey and his wife by phone today.   Review of patient status, including review of consultants reports, relevant laboratory and other test results, and collaboration with appropriate care team members and the patient's provider was performed as part of comprehensive patient evaluation and provision of chronic care management services.    SDOH (Social Determinants of Health) assessments performed: No See Care Plan activities for detailed interventions related to [facility])     Outpatient Encounter Medications as of 05/19/2020  Medication Sig  . albuterol (VENTOLIN HFA) 108 (90 Base) MCG/ACT inhaler Inhale 1-2 puffs into the lungs every 4 (four) hours as needed for wheezing or shortness of breath.  Marland Kitchen aspirin EC 325 MG EC tablet Take 1 tablet (325 mg total) by mouth daily.  . clopidogrel (PLAVIX) 75 MG tablet Take 1 tablet (75 mg total) by mouth daily.  . diclofenac Sodium (VOLTAREN) 1 % GEL Apply 2 g topically 4 (four) times daily.  . fludrocortisone (FLORINEF) 0.1 MG tablet Take 1 tablet (0.1 mg total) by mouth daily.  . pantoprazole (PROTONIX) 40 MG tablet Take 1 tablet (40 mg total) by mouth daily before supper.  . rosuvastatin (CRESTOR) 40 MG tablet Take 1 tablet (40 mg total) by mouth at bedtime.  . Tiotropium Bromide-Olodaterol (STIOLTO RESPIMAT) 2.5-2.5 MCG/ACT AERS Inhale 2 puffs into the lungs daily.  . vitamin C (ASCORBIC ACID) 500 MG tablet Take 500 mg by mouth daily. Am   No facility-administered encounter medications on file as of  05/19/2020.    Goals Addressed              This Visit's Progress   .  PharmD - "I can't afford my inhaler" (pt-stated)        CARE PLAN ENTRY (see longtitudinal plan of care for additional care plan information)  Current Barriers:  . Financial Barriers in complicated patient with multiple medical conditions including COPD, HLD, s/p CVA in 03/2020; patient has [employer] Medicare Part D insurance and reports copay for Anoro inhaler is cost prohibitive at this time  Pharmacist Clinical Goal(s):  Marland Kitchen Over the next 30 days, patient will work with PharmD and providers to relieve medication access concerns  Interventions: . Collaborated with PCP o Recommend dose increase of rosuvastatin to 40 mg daily. Provider agreed & Rx sent to mail order pharmacy o Provider also sent refills of additional maintenance medications to [facility] mail order as requested . Follow up with patient/wife today regarding plan to increase of rosuvastatin from 20 mg to 40 mg for improved LDL lowering for secondary stroke prevention o Patient's wife verbalizes understanding and states will update patient's pillbox accordingly . Counsel on importance of medication adherence o Note using weekly pillbox tool as filled by wife to aid with adherence. . Coordination of care o Again encourage patient/caregiver to follow up with Neurologist for questions from latest visit AVS o Reported will call PCP office to reschedule missed appointment  Patient Self Care Activities:  . Patient to attend scheduled medical appointments o Appointment with ENT  on 9/15 o Next appointment with Neurology on 09/02/20  Please see past updates related to this goal by clicking on the "Past Updates" button in the selected goal         Plan  Telephone follow up appointment with care management team member scheduled for: 10/15 at 9 am  Harlow Asa, PharmD, [place] [phone]

## 2020-05-19 NOTE — Telephone Encounter (Signed)
Pt called to follow up on appt with Dr. Estanislado Pandy office to schedule follow up visit regarding carotid stenosis and narrowing. Would like a call from the nurse.

## 2020-05-19 NOTE — Telephone Encounter (Signed)
Spoke to wife, she will try to call office and and schedule appt

## 2020-05-19 NOTE — Telephone Encounter (Signed)
If he has not received a call yet to schedule follow-up, please have patient contact Dr. Arlean Hopping office to schedule follow-up visit

## 2020-05-19 NOTE — Patient Instructions (Signed)
Thank you allowing the Chronic Care Management Team to be a part of your care! It was a pleasure speaking with you today!     CCM (Chronic Care Management) Team    Noreene Larsson RN, MSN, CCM Nurse Care Coordinator  [phone]   Harlow Asa PharmD  Clinical Pharmacist  [phone]   Eula Fried LCSW Clinical Social Worker [phone]  Visit Information  Goals Addressed              This Visit's Progress   .  PharmD - "I can't afford my inhaler" (pt-stated)        CARE PLAN ENTRY (see longtitudinal plan of care for additional care plan information)  Current Barriers:  . Financial Barriers in complicated patient with multiple medical conditions including COPD, HLD, s/p CVA in 03/2020; patient has [employer] Medicare Part D insurance and reports copay for Anoro inhaler is cost prohibitive at this time  Pharmacist Clinical Goal(s):  Marland Kitchen Over the next 30 days, patient will work with PharmD and providers to relieve medication access concerns  Interventions: . Collaborated with PCP o Recommend dose increase of rosuvastatin to 40 mg daily. Provider agreed & Rx sent to mail order pharmacy o Provider also sent refills of additional maintenance medications to [facility] mail order as requested . Follow up with patient/wife today regarding plan to increase of rosuvastatin from 20 mg to 40 mg for improved LDL lowering for secondary stroke prevention o Patient's wife verbalizes understanding and states will update patient's pillbox accordingly . Counsel on importance of medication adherence o Note using weekly pillbox tool as filled by wife to aid with adherence. . Coordination of care o Again encourage patient/caregiver to follow up with Neurologist for questions from latest visit AVS o Reported will call PCP office to reschedule missed appointment  Patient Self Care Activities:  . Patient to attend scheduled medical appointments o Appointment with ENT on 9/15 o Next appointment  with Neurology on 09/02/20  Please see past updates related to this goal by clicking on the "Past Updates" button in the selected goal         Patient verbalizes understanding of instructions provided today.   Telephone follow up appointment with care management team member scheduled for: 10/15 at 43 am  Harlow Asa, PharmD, [place] [phone]

## 2020-05-20 ENCOUNTER — Ambulatory Visit: Payer: Medicare HMO

## 2020-05-20 ENCOUNTER — Encounter: Payer: Medicare HMO | Admitting: Speech Pathology

## 2020-05-20 ENCOUNTER — Encounter: Payer: Medicare HMO | Admitting: Occupational Therapy

## 2020-05-21 DIAGNOSIS — D37030 Neoplasm of uncertain behavior of the parotid salivary glands: Secondary | ICD-10-CM | POA: Diagnosis not present

## 2020-05-22 ENCOUNTER — Ambulatory Visit: Payer: Medicare HMO

## 2020-05-22 ENCOUNTER — Telehealth (HOSPITAL_COMMUNITY): Payer: Self-pay

## 2020-05-22 ENCOUNTER — Other Ambulatory Visit: Payer: Self-pay

## 2020-05-22 ENCOUNTER — Ambulatory Visit: Payer: Medicare HMO | Admitting: Occupational Therapy

## 2020-05-22 ENCOUNTER — Encounter: Payer: Medicare HMO | Admitting: Speech Pathology

## 2020-05-22 ENCOUNTER — Encounter: Payer: Self-pay | Admitting: Occupational Therapy

## 2020-05-22 ENCOUNTER — Encounter: Payer: Self-pay | Admitting: Otolaryngology

## 2020-05-22 DIAGNOSIS — M6281 Muscle weakness (generalized): Secondary | ICD-10-CM

## 2020-05-22 DIAGNOSIS — R278 Other lack of coordination: Secondary | ICD-10-CM | POA: Diagnosis not present

## 2020-05-22 DIAGNOSIS — R41841 Cognitive communication deficit: Secondary | ICD-10-CM | POA: Diagnosis not present

## 2020-05-22 NOTE — Telephone Encounter (Signed)
Called to schedule f/u, no answer, left vm. AW 

## 2020-05-23 ENCOUNTER — Encounter: Payer: Medicare HMO | Attending: Physical Medicine & Rehabilitation | Admitting: Physical Medicine & Rehabilitation

## 2020-05-23 ENCOUNTER — Other Ambulatory Visit: Payer: Self-pay

## 2020-05-23 ENCOUNTER — Encounter: Payer: Self-pay | Admitting: Physical Medicine & Rehabilitation

## 2020-05-23 VITALS — BP 119/83 | HR 74 | Temp 97.9°F | Ht 72.0 in | Wt 167.6 lb

## 2020-05-23 DIAGNOSIS — R29898 Other symptoms and signs involving the musculoskeletal system: Secondary | ICD-10-CM | POA: Diagnosis not present

## 2020-05-23 DIAGNOSIS — I609 Nontraumatic subarachnoid hemorrhage, unspecified: Secondary | ICD-10-CM | POA: Diagnosis not present

## 2020-05-23 DIAGNOSIS — I6930 Unspecified sequelae of cerebral infarction: Secondary | ICD-10-CM

## 2020-05-23 DIAGNOSIS — R29818 Other symptoms and signs involving the nervous system: Secondary | ICD-10-CM | POA: Diagnosis not present

## 2020-05-23 DIAGNOSIS — I6601 Occlusion and stenosis of right middle cerebral artery: Secondary | ICD-10-CM | POA: Insufficient documentation

## 2020-05-23 NOTE — Progress Notes (Signed)
Subjective:    [name], male    DOB: 30-Jun-1956, 64 y.o.   MRN: [id number] 64 y.o. left-handed male with history of COPD, daily headaches, dyslipidemia increasing coughing episodes was admitted 03/27/2020 with left-sided weakness, difficulty walking and difficulty speaking.  CT head showed dense M1 segment with occlusive thrombus, occlusion of left ICA age-indeterminate with distal reconstitution of cavernous segment and good perfusion of left MCA and bilateral ACAs.  He received TPA and underwent cerebral angio with partial revascularization of right-MCA dominant division with unsuccessful attempts at revascularization of proximal right ICA.  Follow-up MRI brain showed acute cortical/subcortical right MCA infarct with additional infarcts in right caudate nucleus, right pre and postcentral gyri and mid to anterior frontal lobe with SAH and incidental right parotid neoplasm.     Dr. Leonie Man question stroke due to dissection of large vessel from coughing episodes versus hypercoagulopathy state from parotid neoplasm.  Patient was maintained on aspirin alone with recommendations to repeat CT head on 07/30 and then consider DAPT x3 months if [facility] had resolved.  Blood pressure goals recommended 130- 150 due to intracranial stenosis.  He was placed on dysphagia 3 diet, honey liquids due to oropharyngeal dysphagia.  Patient with resultant left and attention with impulsivity, left-sided weakness and balance deficits affecting mobility and ADLs.  CIR was recommended due to functional decline. Admit date: 04/01/2020 Discharge date: 04/09/2020  HPI Patient returns today, seen by nurse practitioner last visit in August.  Patient's swallowing problems have resolved.  No significant cognitive issues, outpatient speech therapy has stopped.  Balance and walking have improved no longer receiving outpatient PT. Continues with outpatient OT.  Still having some issues with left hand function.  Patient is  left-handed.  Despite this has been doing some tiling in the bathroom.  Has been on a stepladder but not anything higher than that. Independent with all self-care and mobility.  His wife does not let him cook.  He is able to make a sandwich hotdog or hamburger.  He has not resumed driving yet.  No history of seizures. The patient is followed up with neurology as well as his primary care physician.  He has been in contact with ENT and further imaging of the parotid mass is planned.  Pain Inventory Average Pain 0 Pain Right Now 0 My pain is no pain  In the last 24 hours, has pain interfered with the following? General activity 0 Relation with others 0 Enjoyment of life 0 What TIME of day is your pain at its worst? na Sleep (in general) Fair  Pain is worse with: no pain Pain improves with: no pain Relief from Meds: no pain  Family History  Problem Relation Age of Onset  . Diabetes Mother   . Heart attack Mother 65  . Cancer Father        liver   . COPD Brother   . HIV/AIDS Brother   . Prostate cancer Neg Hx   . Colon cancer Neg Hx    Social History   Socioeconomic History  . Marital status: Married    Spouse name: Not on file  . Number of children: Not on file  . Years of education: Not on file  . Highest education level: Not on file  Occupational History  . Not on file  Tobacco Use  . Smoking status: Former Smoker    Packs/day: 1.50    Years: 49.00    Pack years: 73.50    Types: Cigarettes  Start date: 72    Quit date: 09/14/2017    Years since quitting: 2.6  . Smokeless tobacco: Former Network engineer  . Vaping Use: Never used  Substance and Sexual Activity  . Alcohol use: Yes    Comment: 6 pack on weekend  . Drug use: No  . Sexual activity: Not on file  Other Topics Concern  . Not on file  Social History Narrative  . Not on file   Social Determinants of Health   Financial Resource Strain:   . Difficulty of Paying Living Expenses: Not on file  Food  Insecurity:   . Worried About Charity fundraiser in the Last Year: Not on file  . Ran Out of Food in the Last Year: Not on file  Transportation Needs:   . Lack of Transportation (Medical): Not on file  . Lack of Transportation (Non-Medical): Not on file  Physical Activity:   . Days of Exercise per Week: Not on file  . Minutes of Exercise per Session: Not on file  Stress:   . Feeling of Stress : Not on file  Social Connections:   . Frequency of Communication with Friends and Family: Not on file  . Frequency of Social Gatherings with Friends and Family: Not on file  . Attends Religious Services: Not on file  . Active Member of Clubs or Organizations: Not on file  . Attends Archivist Meetings: Not on file  . Marital Status: Not on file   Past Surgical History:  Procedure Laterality Date  . BACK SURGERY     metal plate in back  . COLONOSCOPY    . COLONOSCOPY WITH PROPOFOL N/A 04/26/2019   Procedure: COLONOSCOPY WITH PROPOFOL;  Surgeon: Jonathon Bellows, MD;  Location: [facility] ENDOSCOPY;  Service: Gastroenterology;  Laterality: N/A;  . HERNIA REPAIR Right   . IR ANGIO INTRA EXTRACRAN SEL COM CAROTID INNOMINATE UNI L MOD SED  03/27/2020  . IR ANGIO VERTEBRAL SEL SUBCLAVIAN INNOMINATE UNI L MOD SED  03/27/2020  . IR ANGIO VERTEBRAL SEL VERTEBRAL UNI R MOD SED  03/27/2020  . IR CT HEAD LTD  03/27/2020  . IR PERCUTANEOUS ART THROMBECTOMY/INFUSION INTRACRANIAL INC DIAG ANGIO  03/27/2020  . KNEE ARTHROSCOPY Left 03/28/2015   Procedure: Arthroscopic partial medial meniscectomy plus chondral debridement;  Surgeon: Leanor Kail, MD;  Location: [place];  Service: Orthopedics;  Laterality: Left;  . RADIOLOGY WITH ANESTHESIA N/A 03/27/2020   Procedure: IR WITH ANESTHESIA;  Surgeon: Luanne Bras, MD;  Location: [place];  Service: Radiology;  Laterality: N/A;   Past Surgical History:  Procedure Laterality Date  . BACK SURGERY     metal plate in back  . COLONOSCOPY    .  COLONOSCOPY WITH PROPOFOL N/A 04/26/2019   Procedure: COLONOSCOPY WITH PROPOFOL;  Surgeon: Jonathon Bellows, MD;  Location: [facility] ENDOSCOPY;  Service: Gastroenterology;  Laterality: N/A;  . HERNIA REPAIR Right   . IR ANGIO INTRA EXTRACRAN SEL COM CAROTID INNOMINATE UNI L MOD SED  03/27/2020  . IR ANGIO VERTEBRAL SEL SUBCLAVIAN INNOMINATE UNI L MOD SED  03/27/2020  . IR ANGIO VERTEBRAL SEL VERTEBRAL UNI R MOD SED  03/27/2020  . IR CT HEAD LTD  03/27/2020  . IR PERCUTANEOUS ART THROMBECTOMY/INFUSION INTRACRANIAL INC DIAG ANGIO  03/27/2020  . KNEE ARTHROSCOPY Left 03/28/2015   Procedure: Arthroscopic partial medial meniscectomy plus chondral debridement;  Surgeon: Leanor Kail, MD;  Location: [place];  Service: Orthopedics;  Laterality: Left;  . RADIOLOGY WITH  ANESTHESIA N/A 03/27/2020   Procedure: IR WITH ANESTHESIA;  Surgeon: Luanne Bras, MD;  Location: [place];  Service: Radiology;  Laterality: N/A;   Past Medical History:  Diagnosis Date  . COPD (chronic obstructive pulmonary disease) (HCC)    mild  . COVID-19 09/2019  . Erectile dysfunction   . Headache    daily, stress headaches  . Knee pain, left   . Seasonal allergies   . Tobacco dependence    BP 119/83   Pulse 74   Temp 97.9 F (36.6 C)   Ht 6' (1.829 m)   Wt 167 lb 9.6 oz (76 kg)   SpO2 95%   BMI 22.73 kg/m   Opioid Risk Score:   Fall Risk Score:  `1  Depression screen PHQ 2/9  Depression screen [facility] 2/9 05/23/2020 04/18/2020 12/27/2019 11/05/2019 05/08/2019 04/03/2019 10/02/2018  Decreased Interest 0 3 0 0 0 0 0  Down, Depressed, Hopeless 0 3 0 0 0 0 0  PHQ - 2 Score 0 6 0 0 0 0 0  Altered sleeping - 3 - - - - -  Tired, decreased energy - 0 - - - - -  Change in appetite - 0 - - - - -  Feeling bad or failure about yourself  - 1 - - - - -  Trouble concentrating - 0 - - - - -  Moving slowly or fidgety/restless - 0 - - - - -  Suicidal thoughts - 0 - - - - -  PHQ-9 Score - 10 - - - - -  Difficult doing work/chores -  Extremely dIfficult - - - - -   Review of Systems  Constitutional: Positive for unexpected weight change.  All other systems reviewed and are negative.      Objective:   Physical Exam Vitals and nursing note reviewed.  Constitutional:      General: He is not in acute distress.    Appearance: He is normal weight.  HENT:     Head: Normocephalic and atraumatic.  Eyes:     Extraocular Movements: Extraocular movements intact.     Conjunctiva/sclera: Conjunctivae normal.     Pupils: Pupils are equal, round, and reactive to light.     Comments: Visual fields are intact confrontation testing.  No diplopia on lateral gaze  Cardiovascular:     Rate and Rhythm: Normal rate and regular rhythm.     Heart sounds: Normal heart sounds. No murmur heard.   Pulmonary:     Effort: No respiratory distress.     Breath sounds: Normal breath sounds. No stridor. No wheezing.  Abdominal:     General: Abdomen is flat. Bowel sounds are normal. There is no distension.     Palpations: Abdomen is soft. There is no mass.     Tenderness: There is no abdominal tenderness.  Neurological:     Mental Status: He is alert and oriented to person, place, and time.     Comments: Motor strength is 5/5 bilateral deltoid, bicep, tricep, grip, hip flexor, knee extensor ankle dorsiflexor plantar flexor Sensation intact to light touch There is no evidence of extinction on tactile testing bilateral upper extremities. Visual field testing with confrontation does not demonstrate any signs of neglect. Ambulates without assist device no evidence of toe drag or knee instability Able to toe walk as well as heel walk. Tandem gait is intact Romberg is negative  Psychiatric:        Mood and Affect: Mood normal.  Behavior: Behavior normal.   Reduced finger thumb opposition left upper extremity Mild dysdiadochokinesis left upper extremity.       Assessment & Plan:  #1.  History of right MCA distribution infarct with  residual left upper extremity fine motor deficits.  Overall an excellent functional recovery. Would recommend continue outpatient OT through the end of the month Gradually resume driving as below  Graduated return to driving instructions were provided. It is recommended that the patient first drives with another licensed driver in an empty parking lot. If the patient does well with this, and they can drive on a quiet street with the licensed driver. If the patient does well with this they can drive on a busy street with a licensed driver. If the patient does well with this, the next time out they can go by himself. For the first month after resuming driving, I recommend no nighttime or Interstate driving.   Should be able to return to work with restrictions on climbing ladders and working at [employer].  Also would avoid lifting and carrying more than 25 pounds.  Should be ready in mid October.

## 2020-05-23 NOTE — Patient Instructions (Signed)

## 2020-05-24 NOTE — Therapy (Signed)
[place] MAIN [facility] SERVICES [address] [place], [place], [address] Phone: [phone]   Fax:  [phone]  Occupational Therapy Treatment  Patient Details  Name: Ethan Fitzgerald MRN: [id number] Date of Birth: 17-Dec-1955 Referring Provider (OT): Kirsteins   Encounter Date: 05/22/2020   OT End of Session - 05/25/20 0859    Visit Number 7    Number of Visits 24    Date for OT Re-Evaluation 07/08/20    OT Start Time 1015    OT Stop Time 1100    OT Time Calculation (min) 45 min    Activity Tolerance Patient tolerated treatment well    Behavior During Therapy [facility] for tasks assessed/performed           Past Medical History:  Diagnosis Date   COPD (chronic obstructive pulmonary disease) ([place])    mild   COVID-19 09/2019   Erectile dysfunction    Headache    daily, stress headaches   Knee pain, left    Seasonal allergies    Tobacco dependence     Past Surgical History:  Procedure Laterality Date   BACK SURGERY     metal plate in back   COLONOSCOPY     COLONOSCOPY WITH PROPOFOL N/A 04/26/2019   Procedure: COLONOSCOPY WITH PROPOFOL;  Surgeon: Jonathon Bellows, MD;  Location: [facility] ENDOSCOPY;  Service: Gastroenterology;  Laterality: N/A;   HERNIA REPAIR Right    IR ANGIO INTRA EXTRACRAN SEL COM CAROTID INNOMINATE UNI L MOD SED  03/27/2020   IR ANGIO VERTEBRAL SEL SUBCLAVIAN INNOMINATE UNI L MOD SED  03/27/2020   IR ANGIO VERTEBRAL SEL VERTEBRAL UNI R MOD SED  03/27/2020   IR CT HEAD LTD  03/27/2020   IR PERCUTANEOUS ART THROMBECTOMY/INFUSION INTRACRANIAL INC DIAG ANGIO  03/27/2020   KNEE ARTHROSCOPY Left 03/28/2015   Procedure: Arthroscopic partial medial meniscectomy plus chondral debridement;  Surgeon: Leanor Kail, MD;  Location: [place];  Service: Orthopedics;  Laterality: Left;   RADIOLOGY WITH ANESTHESIA N/A 03/27/2020   Procedure: IR WITH ANESTHESIA;  Surgeon: Luanne Bras, MD;  Location: [place];   Service: Radiology;  Laterality: N/A;    There were no vitals filed for this visit.   Subjective Assessment - 05/25/20 0859    Subjective  Patient reports he had an appt yesterday to discuss a mass on his right side, deep in neck    Pertinent History Ethan Fitzgerald is a 64 y.o. left-handed male with history of COPD, daily headaches, dyslipidemia increasing coughing episodes was admitted 03/27/2020 with left-sided weakness, difficulty walking and difficulty speaking.  CT head showed dense M1 segment with occlusive thrombus, occlusion of left ICA age-indeterminate with distal reconstitution of cavernous segment and good perfusion of left MCA and bilateral ACAs.  He received TPA and underwent cerebral angio with partial revascularization of right-MCA dominant division with unsuccessful attempts at revascularization of proximal right ICA.  Follow-up MRI brain showed acute cortical/subcortical right MCA infarct with additional infarcts in right caudate nucleus, right pre and postcentral gyri and mid to anterior frontal lobe with SAH and incidental right parotid neoplasm.    Patient Stated Goals Patient would like to be able to work again, needs to be able to drive to return.    Currently in Pain? No/denies    Pain Score 0-No pain          Neuromuscular reeducation:   Patient seen for manipulation of pieces with left hand for Tweezer dexterity task, picking up and  placing small pieces into grid with  Left hand , cues to remove small pieces with tweezers and focusing on the tip end of the tweezer.  Bethena Roys board with placing pegs into large jumbo board, white dots facing up and then working towards manipulation to pick up and flip dots to opposite side with emphasis on speed and dexterity.  Cues required for select prehension patterns.   Response to tx: Patient continues to progress and is active at home using hand and helping with bathroom remodel.  Patient manipulation skills improving, needs to  continue to focus on speed and dexterity of movements as well as higher level manipulation skills with emphasis on not dropping items.  Continue to work towards goals in plan of care to improve left hand function for daily tasks.                   OT Education - 05/25/20 0859    Education Details fine motor coordination, manipulation skills    Person(s) Educated Patient    Methods Explanation;Demonstration    Comprehension Verbalized understanding;Returned demonstration               OT Long Term Goals - 05/22/20 1100      OT LONG TERM GOAL #1   Title Patient will be independent with home exercise program for strengthening and coordination    Baseline will need modifications to current HEP as he progresses    Time 12    Period Weeks    Status On-going      OT LONG TERM GOAL #2   Title Patient will complete homemaking tasks of vacuuming and sweeping with modified independence and good balance    Baseline difficulty at eval    Time 6    Period Weeks    Status On-going      OT LONG TERM GOAL #3   Title Patient will improve left shoulder flexion by 20 degrees to assist with placing items in and out of the closet.    Baseline left shoulder flex 118    Time 6    Period Weeks    Status On-going      OT LONG TERM GOAL #4   Title Pt will improve left UE strength by 1 mm grade to assist with performing household maintenance tasks.    Baseline 4/5 left UE    Time 6    Period Weeks    Status On-going      OT LONG TERM GOAL #5   Title Patient will complete tub/shower transfer with modified independence and good safety    Baseline supervision    Time 6    Period Weeks    Status Achieved      OT LONG TERM GOAL #6   Title Patient will perform meal preparation with modified independence.    Baseline unable at eval    Time 12    Period Weeks    Status On-going      OT LONG TERM GOAL #7   Title Pt will demonstrate ability to use non power tools for home  maintenance tasks with good safety awareness and coordination.    Baseline difficulty at eval    Time 12    Period Weeks    Status On-going      OT LONG TERM GOAL #8   Title Patient will increase FOTO score to equal to or greater than 76  to demonstrate statistically significant improvement in ADL/IADLs and quality of life.  Baseline FOTO at eval 65    Time 12    Period Weeks    Status On-going                 Plan - 05/25/20 0900    Clinical Impression Statement Patient continues to progress and is active at home using hand and helping with bathroom remodel.  Patient manipulation skills improving, needs to continue to focus on speed and dexterity of movements as well as higher level manipulation skills with emphasis on not dropping items.  Continue to work towards goals in plan of care to improve left hand function for daily tasks.    OT Occupational Profile and History Detailed Assessment- Review of Records and additional review of physical, cognitive, psychosocial history related to current functional performance    Occupational performance deficits (Please refer to evaluation for details): ADL's;IADL's;Work;Leisure    Body Structure / Function / Physical Skills ADL;Dexterity;ROM;Strength;Vision;Balance;Coordination;FMC;IADL;Pain;UE functional use;GMC    Psychosocial Skills Environmental  Adaptations;Routines and Behaviors;Habits    Rehab Potential Good    Clinical Decision Making Limited treatment options, no task modification necessary    Comorbidities Affecting Occupational Performance: May have comorbidities impacting occupational performance    Modification or Assistance to Complete Evaluation  No modification of tasks or assist necessary to complete eval    OT Frequency 2x / week    OT Duration 12 weeks    OT Treatment/Interventions Self-care/ADL training;Cryotherapy;Therapeutic exercise;DME and/or AE instruction;Functional Mobility Training;Balance training;Neuromuscular  education;Manual Therapy;Visual/perceptual remediation/compensation;Moist Heat;Contrast Bath;Therapeutic activities;Patient/family education    OT Home Exercise Plan L UE ROM of digits/wrist    Consulted and Agree with Plan of Care Patient           Patient will benefit from skilled therapeutic intervention in order to improve the following deficits and impairments:   Body Structure / Function / Physical Skills: ADL, Dexterity, ROM, Strength, Vision, Balance, Coordination, FMC, IADL, Pain, UE functional use, GMC   Psychosocial Skills: Environmental  Adaptations, Routines and Behaviors, Habits   Visit Diagnosis: Muscle weakness (generalized)  Other lack of coordination  Cognitive communication deficit    Problem List Patient Active Problem List   Diagnosis Date Noted   History of cerebrovascular accident (CVA) with residual deficit 04/16/2020   ICAO (internal carotid artery occlusion), left 04/16/2020   Hemiplegia of dominant side following cerebrovascular accident (CVA) ([place]) 04/16/2020   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) 04/16/2020   Parotid mass 03/31/2020   Carotid stenosis 03/31/2020   Arterial hypotension 03/31/2020   Dysphagia due to recent stroke 03/31/2020   COPD without exacerbation ([place]) 03/31/2020   Hypokalemia 03/31/2020   Right shoulder pain 03/31/2020   SAH (subarachnoid hemorrhage) ([place]) secondary to IR 03/31/2020   Stroke (cerebrum) ([facility]) - mult B anterior infarcts s/p tPA + R MCA revascularizion. 03/27/2020   Middle cerebral artery embolism, right 03/27/2020   History of COVID-19 12/04/2019   Centrilobular emphysema ([place]) 04/03/2019   Seasonal allergies 02/18/2017   Elevated hemoglobin A1c 02/18/2017   Erectile dysfunction 02/18/2017   Hyperlipidemia 02/18/2017   History of hemorrhoids 02/18/2017   Former smoker 02/18/2017   Chronic low back pain 02/18/2017   Chronic pain of left knee  02/18/2017   Ethan Fitzgerald T Ethan Fitzgerald, OTR/L, CLT  Ethan Fitzgerald 05/26/2020, 9:03 AM  [place] MAIN [facility] SERVICES [place], [place], [address] Phone: [phone]   Fax:  [phone]  Name: Ethan Fitzgerald MRN: [id number] Date of Birth: 05/27/1956

## 2020-05-26 ENCOUNTER — Ambulatory Visit: Payer: Medicare HMO | Admitting: Occupational Therapy

## 2020-05-26 ENCOUNTER — Ambulatory Visit: Payer: Medicare HMO | Admitting: Physical Therapy

## 2020-05-26 ENCOUNTER — Other Ambulatory Visit: Payer: Self-pay

## 2020-05-26 ENCOUNTER — Encounter: Payer: Self-pay | Admitting: Occupational Therapy

## 2020-05-26 ENCOUNTER — Encounter: Payer: Medicare HMO | Admitting: Speech Pathology

## 2020-05-26 DIAGNOSIS — M6281 Muscle weakness (generalized): Secondary | ICD-10-CM | POA: Diagnosis not present

## 2020-05-26 DIAGNOSIS — R278 Other lack of coordination: Secondary | ICD-10-CM

## 2020-05-26 DIAGNOSIS — R41841 Cognitive communication deficit: Secondary | ICD-10-CM

## 2020-05-26 NOTE — Therapy (Signed)
[place] MAIN [facility] SERVICES [address] [place], [place], [address] Phone: [phone]   Fax:  [phone]  Occupational Therapy Treatment  Patient Details  Name: Ethan Fitzgerald MRN: [id number] Date of Birth: 02-24-1956 Referring Provider (OT): Kirsteins   Encounter Date: 05/26/2020   OT End of Session - 05/28/20 1540    Visit Number 8    Number of Visits 24    Date for OT Re-Evaluation 07/08/20    OT Start Time 1300    OT Stop Time 1345    OT Time Calculation (min) 45 min    Activity Tolerance Patient tolerated treatment well    Behavior During Therapy [facility] for tasks assessed/performed           Past Medical History:  Diagnosis Date  . COPD (chronic obstructive pulmonary disease) ([facility])    mild  . COVID-19 09/2019  . Erectile dysfunction   . Headache    daily, stress headaches  . Knee pain, left   . Seasonal allergies   . Tobacco dependence     Past Surgical History:  Procedure Laterality Date  . BACK SURGERY     metal plate in back  . COLONOSCOPY    . COLONOSCOPY WITH PROPOFOL N/A 04/26/2019   Procedure: COLONOSCOPY WITH PROPOFOL;  Surgeon: Jonathon Bellows, MD;  Location: [facility] ENDOSCOPY;  Service: Gastroenterology;  Laterality: N/A;  . HERNIA REPAIR Right   . IR ANGIO INTRA EXTRACRAN SEL COM CAROTID INNOMINATE UNI L MOD SED  03/27/2020  . IR ANGIO VERTEBRAL SEL SUBCLAVIAN INNOMINATE UNI L MOD SED  03/27/2020  . IR ANGIO VERTEBRAL SEL VERTEBRAL UNI R MOD SED  03/27/2020  . IR CT HEAD LTD  03/27/2020  . IR PERCUTANEOUS ART THROMBECTOMY/INFUSION INTRACRANIAL INC DIAG ANGIO  03/27/2020  . KNEE ARTHROSCOPY Left 03/28/2015   Procedure: Arthroscopic partial medial meniscectomy plus chondral debridement;  Surgeon: Leanor Kail, MD;  Location: [place];  Service: Orthopedics;  Laterality: Left;  . RADIOLOGY WITH ANESTHESIA N/A 03/27/2020   Procedure: IR WITH ANESTHESIA;  Surgeon: Luanne Bras, MD;  Location: [place];   Service: Radiology;  Laterality: N/A;    There were no vitals filed for this visit.   Subjective Assessment - 05/28/20 1540    Subjective  Patient reports his doctor appt went well on Friday, MD cleared him to drive again.  Patient reports he drove to therapy today.  Pt reports his neck is hurting after going under the house for some plumbing issues.    Currently in Pain? No/denies    Pain Score 0-No pain           MD appt went well, now able to drive again and feels more independent with being able to go places without relying on others.    Neuromuscular reeducation:   Patient seen for use of resistive pinch pins, placing onto yardstick with targets in elevated plane of motion with overhead reaching patterns to increase ROM and strength.  Manipulation of Small snap beads to place together and remove to target finger strength as well as coordination/manipulation of objects less than 1/2 inch in size.  Manipulation of Toothpicks from tabletop flat surface and placing into small holed container to work on precision of fine motor control movement patterns.  Cues for turning objects in hand.Manipulation of Coins from flat surface, moving items to palm to use the hand for storage and then placing into resistive bank with use of tip to tip prehension patterns.  Manipulation of chinese balls, moving each ball from one side of the hand to the other, does better with counterclockwise versus clockwise direction.    Response to tx: Patient continues to make excellent progress in all areas.  Occasional neck pain at times from doing work on bathroom renovation but better today.  Patient's fine motor coordination improving to be able to manipulate objects smaller than 1/2 inch in size as well as speed and dexterity of task.  Continue to work towards goals, will discuss progress next session and work towards discharge planning.                       OT Education - 05/29/20 1540     Education Details fine motor coordination, manipulation skills, pinch skills    Person(s) Educated Patient    Methods Explanation;Demonstration    Comprehension Verbalized understanding;Returned demonstration               OT Long Term Goals - 05/22/20 1100      OT LONG TERM GOAL #1   Title Patient will be independent with home exercise program for strengthening and coordination    Baseline will need modifications to current HEP as he progresses    Time 12    Period Weeks    Status On-going      OT LONG TERM GOAL #2   Title Patient will complete homemaking tasks of vacuuming and sweeping with modified independence and good balance    Baseline difficulty at eval    Time 6    Period Weeks    Status On-going      OT LONG TERM GOAL #3   Title Patient will improve left shoulder flexion by 20 degrees to assist with placing items in and out of the closet.    Baseline left shoulder flex 118    Time 6    Period Weeks    Status On-going      OT LONG TERM GOAL #4   Title Pt will improve left UE strength by 1 mm grade to assist with performing household maintenance tasks.    Baseline 4/5 left UE    Time 6    Period Weeks    Status On-going      OT LONG TERM GOAL #5   Title Patient will complete tub/shower transfer with modified independence and good safety    Baseline supervision    Time 6    Period Weeks    Status Achieved      OT LONG TERM GOAL #6   Title Patient will perform meal preparation with modified independence.    Baseline unable at eval    Time 12    Period Weeks    Status On-going      OT LONG TERM GOAL #7   Title Pt will demonstrate ability to use non power tools for home maintenance tasks with good safety awareness and coordination.    Baseline difficulty at eval    Time 12    Period Weeks    Status On-going      OT LONG TERM GOAL #8   Title Patient will increase FOTO score to equal to or greater than 76  to demonstrate statistically significant  improvement in ADL/IADLs and quality of life.    Baseline FOTO at eval 65    Time 12    Period Weeks    Status On-going  Plan - 05/28/20 1541    Clinical Impression Statement Patient continues to make excellent progress in all areas.  Occasional neck pain at times from doing work on bathroom renovation but better today.  Patient's fine motor coordination improving to be able to manipulate objects smaller than 1/2 inch in size as well as speed and dexterity of task.  Continue to work towards goals, will discuss progress next session and work towards discharge planning.    OT Occupational Profile and History Detailed Assessment- Review of Records and additional review of physical, cognitive, psychosocial history related to current functional performance    Occupational performance deficits (Please refer to evaluation for details): ADL's;IADL's;Work;Leisure    Body Structure / Function / Physical Skills ADL;Dexterity;ROM;Strength;Vision;Balance;Coordination;FMC;IADL;Pain;UE functional use;GMC    Psychosocial Skills Environmental  Adaptations;Routines and Behaviors;Habits    Rehab Potential Good    Clinical Decision Making Limited treatment options, no task modification necessary    Comorbidities Affecting Occupational Performance: May have comorbidities impacting occupational performance    Modification or Assistance to Complete Evaluation  No modification of tasks or assist necessary to complete eval    OT Frequency 2x / week    OT Duration 12 weeks    OT Treatment/Interventions Self-care/ADL training;Cryotherapy;Therapeutic exercise;DME and/or AE instruction;Functional Mobility Training;Balance training;Neuromuscular education;Manual Therapy;Visual/perceptual remediation/compensation;Moist Heat;Contrast Bath;Therapeutic activities;Patient/family education    OT Home Exercise Plan L UE ROM of digits/wrist    Consulted and Agree with Plan of Care Patient            Patient will benefit from skilled therapeutic intervention in order to improve the following deficits and impairments:   Body Structure / Function / Physical Skills: ADL, Dexterity, ROM, Strength, Vision, Balance, Coordination, FMC, IADL, Pain, UE functional use, GMC   Psychosocial Skills: Environmental  Adaptations, Routines and Behaviors, Habits   Visit Diagnosis: Muscle weakness (generalized)  Other lack of coordination  Cognitive communication deficit    Problem List Patient Active Problem List   Diagnosis Date Noted  . History of cerebrovascular accident (CVA) with residual deficit 04/16/2020  . ICAO (internal carotid artery occlusion), left 04/16/2020  . Hemiplegia of dominant side following cerebrovascular accident (CVA) ([place]) 04/16/2020  . 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 Parotid mass 03/31/2020  . Carotid stenosis 03/31/2020  . Arterial hypotension 03/31/2020  . Dysphagia due to recent stroke 03/31/2020  . COPD without exacerbation ([place]) 03/31/2020  . Hypokalemia 03/31/2020  . Right shoulder pain 03/31/2020  . SAH (subarachnoid hemorrhage) ([place]) secondary to IR 03/31/2020  . Stroke (cerebrum) ([facility]) - mult B anterior infarcts s/p tPA + R MCA revascularizion. 03/27/2020  . Middle cerebral artery embolism, right 03/27/2020  . History of COVID-19 12/04/2019  . Centrilobular emphysema ([place]) 04/03/2019  . Seasonal allergies 02/18/2017  . Elevated hemoglobin A1c 02/18/2017  . Erectile dysfunction 02/18/2017  . Hyperlipidemia 02/18/2017  . History of hemorrhoids 02/18/2017  . Former smoker 02/18/2017  . Chronic low back pain 02/18/2017  . Chronic pain of left knee 02/18/2017   Jermall Isaacson T Karolee Meloni, OTR/L, CLT  Raydel Hosick 05/29/2020, 3:50 PM  [place] MAIN [facility] SERVICES [address] [place], [place], [address] Phone: [phone]   Fax:  [phone]  Name: ARAD BURSTON MRN: [id number] Date of Birth: 09/24/1955

## 2020-05-27 ENCOUNTER — Telehealth: Payer: Self-pay

## 2020-05-27 NOTE — Telephone Encounter (Signed)
As per wife he was given diclofenac gel which was not helping she wants to go with Tylenol extra strength option.

## 2020-05-27 NOTE — Telephone Encounter (Signed)
Thanks. I would avoid Naproxen, while he is on Aspirin and Plavix.  Naproxen can interfere with those meds and increase bleeding risk it is not ideal for patients who have had stroke or heart attack.  Safest option is Tylenol and topical muscle rub.  Tylenol Extra Strength 500mg  tab - take two tabs every 6-8 hours or 3 times a day. (max 8 pills in 24 hours)  If he wants to add a different medicine, I would offer a muscle relaxant, such as Baclofen. He has been on Flexeril before, and that is fine too. Baclofen can be less sedating compared to Flexeril.  [name], [facility] 05/27/2020, 4:38 PM

## 2020-05-27 NOTE — Telephone Encounter (Signed)
As per wife he was doing some plumbing work in the bathroom and think pulled his muscle from past 3 days neck pain he was Rx naproxen in past for back pain but due to recent Hx of Stroke two month ago wanted to find out from PCP is it safe to take this medication.

## 2020-05-27 NOTE — Telephone Encounter (Signed)
Copied from [place] [id number]. Topic: General - Other >> May 27, 2020  1:28 PM Keene Breath wrote: Reason for CRM: Patient's wife called to ask about medication for his pulled muscle in his neck.  Patient wants to know which medication he can take because he is unsure since he had a stroke a few months ago.  Please advise and call wife [name]) to discuss at [phone]

## 2020-05-28 ENCOUNTER — Ambulatory Visit: Payer: Medicare HMO | Admitting: Occupational Therapy

## 2020-05-28 ENCOUNTER — Other Ambulatory Visit: Payer: Self-pay

## 2020-05-28 ENCOUNTER — Encounter: Payer: Medicare HMO | Admitting: Speech Pathology

## 2020-05-28 DIAGNOSIS — R278 Other lack of coordination: Secondary | ICD-10-CM

## 2020-05-28 DIAGNOSIS — R41841 Cognitive communication deficit: Secondary | ICD-10-CM | POA: Diagnosis not present

## 2020-05-28 DIAGNOSIS — M6281 Muscle weakness (generalized): Secondary | ICD-10-CM | POA: Diagnosis not present

## 2020-05-29 ENCOUNTER — Encounter: Payer: Self-pay | Admitting: Occupational Therapy

## 2020-05-29 NOTE — Therapy (Signed)
[place] MAIN [facility] SERVICES [address] [place], [place], [address] Phone: [phone]   Fax:  [phone]  Occupational Therapy Treatment  Patient Details  Name: Ethan Fitzgerald MRN: [id number] Date of Birth: [birth year] Referring Provider (OT): Kirsteins   Encounter Date: 05/28/2020   OT End of Session - 05/29/20 1640    Visit Number 9    Number of Visits 24    Date for OT Re-Evaluation 07/08/20    OT Start Time 1259    OT Stop Time 1345    OT Time Calculation (min) 46 min    Activity Tolerance Patient tolerated treatment well    Behavior During Therapy [facility] for tasks assessed/performed           Past Medical History:  Diagnosis Date  . COPD (chronic obstructive pulmonary disease) (HCC)    mild  . COVID-19 09/2019  . Erectile dysfunction   . Headache    daily, stress headaches  . Knee pain, left   . Seasonal allergies   . Tobacco dependence     Past Surgical History:  Procedure Laterality Date  . BACK SURGERY     metal plate in back  . COLONOSCOPY    . COLONOSCOPY WITH PROPOFOL N/A 04/26/2019   Procedure: COLONOSCOPY WITH PROPOFOL;  Surgeon: Jonathon Bellows, MD;  Location: [facility] ENDOSCOPY;  Service: Gastroenterology;  Laterality: N/A;  . HERNIA REPAIR Right   . IR ANGIO INTRA EXTRACRAN SEL COM CAROTID INNOMINATE UNI L MOD SED  03/27/2020  . IR ANGIO VERTEBRAL SEL SUBCLAVIAN INNOMINATE UNI L MOD SED  03/27/2020  . IR ANGIO VERTEBRAL SEL VERTEBRAL UNI R MOD SED  03/27/2020  . IR CT HEAD LTD  03/27/2020  . IR PERCUTANEOUS ART THROMBECTOMY/INFUSION INTRACRANIAL INC DIAG ANGIO  03/27/2020  . KNEE ARTHROSCOPY Left 03/28/2015   Procedure: Arthroscopic partial medial meniscectomy plus chondral debridement;  Surgeon: Leanor Kail, MD;  Location: [place];  Service: Orthopedics;  Laterality: Left;  . RADIOLOGY WITH ANESTHESIA N/A 03/27/2020   Procedure: IR WITH ANESTHESIA;  Surgeon: Luanne Bras, MD;  Location: [place];   Service: Radiology;  Laterality: N/A;    There were no vitals filed for this visit.   Subjective Assessment - 05/29/20 1639    Pertinent History Ethan Fitzgerald is a 64 y.o. left-handed male with history of COPD, daily headaches, dyslipidemia increasing coughing episodes was admitted 03/27/2020 with left-sided weakness, difficulty walking and difficulty speaking.  CT head showed dense M1 segment with occlusive thrombus, occlusion of left ICA age-indeterminate with distal reconstitution of cavernous segment and good perfusion of left MCA and bilateral ACAs.  He received TPA and underwent cerebral angio with partial revascularization of right-MCA dominant division with unsuccessful attempts at revascularization of proximal right ICA.  Follow-up MRI brain showed acute cortical/subcortical right MCA infarct with additional infarcts in right caudate nucleus, right pre and postcentral gyri and mid to anterior frontal lobe with SAH and incidental right parotid neoplasm.    Patient Stated Goals Patient would like to be able to work again, needs to be able to drive to return.    Currently in Pain? No/denies    Pain Score 0-No pain           Patient reports his potential date for Return to work is 06-23-2020, discussed progress and will potentially plan for discharge from OT in one week.   Patient reports he does not have any concerns for work at this time, will not be  using an extension ladder like he did in the past.  He will think more about the tasks required for his job and we will readdress next session.    Neuromuscular reeducation:  Tetris style puzzle for visual scanning, attention and fine motor coordination skills with use of left UE.   Difficulty with determining red from orange colors  Tennis ball drills with focus on hand eye coordination skills, reaction time and incorporation of movement into tasks.    Manipulation of Nuts/bolts, screws with screw driver, using left hand with screw  driver, right hand to stabilize nut at the bottom to put on, remove with right hand stabilizing screw driver and using left hand to screw off the nut.    Response to tx: Patient has continued to make great progress, planning for discharge potentially in another week, will finalize home program and address any return to work issues/questions.  Patient reports he will not be using an extension ladder in his job with return to work.  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                    OT Education - 05/29/20 1640    Education Details fine motor coordination, manipulation skills, pinch skills    Person(s) Educated Patient    Methods Explanation;Demonstration    Comprehension Verbalized understanding;Returned demonstration               OT Long Term Goals - 05/22/20 1100      OT LONG TERM GOAL #1   Title Patient will be independent with home exercise program for strengthening and coordination    Baseline will need modifications to current HEP as he progresses    Time 12    Period Weeks    Status On-going      OT LONG TERM GOAL #2   Title Patient will complete homemaking tasks of vacuuming and sweeping with modified independence and good balance    Baseline difficulty at eval    Time 6    Period Weeks    Status On-going      OT LONG TERM GOAL #3   Title Patient will improve left shoulder flexion by 20 degrees to assist with placing items in and out of the closet.    Baseline left shoulder flex 118    Time 6    Period Weeks    Status On-going      OT LONG TERM GOAL #4   Title Pt will improve left UE strength by 1 mm grade to assist with performing household maintenance tasks.    Baseline 4/5 left UE    Time 6    Period Weeks    Status On-going      OT LONG TERM GOAL #5   Title Patient will complete tub/shower transfer with modified  independence and good safety    Baseline supervision    Time 6    Period Weeks    Status Achieved      OT LONG TERM GOAL #6   Title Patient will perform meal preparation with modified independence.    Baseline unable at eval    Time 12    Period Weeks    Status On-going      OT LONG TERM GOAL #7   Title Pt will demonstrate ability to use non power tools for home  maintenance tasks with good safety awareness and coordination.    Baseline difficulty at eval    Time 12    Period Weeks    Status On-going      OT LONG TERM GOAL #8   Title Patient will increase FOTO score to equal to or greater than 76  to demonstrate statistically significant improvement in ADL/IADLs and quality of life.    Baseline FOTO at eval 65    Time 12    Period Weeks    Status On-going                 Plan - 05/29/20 1640    Clinical Impression Statement Patient has continued to make great progress, planning for discharge potentially in another week, will finalize home program and address any return to work issues/questions.  Patient reports he will not be using an extension ladder in his job with return to work.  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    OT Occupational Profile and History Detailed Assessment- Review of Records and additional review of physical, cognitive, psychosocial history related to current functional performance    Occupational performance deficits (Please refer to evaluation for details): ADL's;IADL's;Work;Leisure    Body Structure / Function / Physical Skills ADL;Dexterity;ROM;Strength;Vision;Balance;Coordination;FMC;IADL;Pain;UE functional use;GMC    Psychosocial Skills Environmental  Adaptations;Routines and Behaviors;Habits    Rehab Potential Good    Clinical Decision Making Limited treatment options, no task modification necessary    Comorbidities Affecting  Occupational Performance: May have comorbidities impacting occupational performance    Modification or Assistance to Complete Evaluation  No modification of tasks or assist necessary to complete eval    OT Frequency 2x / week    OT Duration 12 weeks    OT Treatment/Interventions Self-care/ADL training;Cryotherapy;Therapeutic exercise;DME and/or AE instruction;Functional Mobility Training;Balance training;Neuromuscular education;Manual Therapy;Visual/perceptual remediation/compensation;Moist Heat;Contrast Bath;Therapeutic activities;Patient/family education    OT Home Exercise Plan L UE ROM of digits/wrist    Consulted and Agree with Plan of Care Patient           Patient will benefit from skilled therapeutic intervention in order to improve the following deficits and impairments:   Body Structure / Function / Physical Skills: ADL, Dexterity, ROM, Strength, Vision, Balance, Coordination, FMC, IADL, Pain, UE functional use, GMC   Psychosocial Skills: Environmental  Adaptations, Routines and Behaviors, Habits   Visit Diagnosis: Muscle weakness (generalized)  Other lack of coordination  Cognitive communication deficit    Problem List Patient Active Problem List   Diagnosis Date Noted  . History of cerebrovascular accident (CVA) with residual deficit 04/16/2020  . ICAO (internal carotid artery occlusion), left 04/16/2020  . Hemiplegia of dominant side following cerebrovascular accident (CVA) ([place]) 04/16/2020  . 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 Parotid mass 03/31/2020  . Carotid stenosis 03/31/2020  . Arterial hypotension 03/31/2020  . Dysphagia due to recent stroke 03/31/2020  . COPD without exacerbation ([place]) 03/31/2020  . Hypokalemia 03/31/2020  . Right shoulder pain 03/31/2020  . SAH (subarachnoid hemorrhage) ([place]) secondary to IR 03/31/2020  . Stroke (cerebrum) (HCC) - mult B anterior infarcts s/p tPA + R MCA  revascularizion. 03/27/2020  . Middle cerebral artery embolism, right 03/27/2020  . History of COVID-19 12/04/2019  . Centrilobular emphysema ([place]) 04/03/2019  . Seasonal allergies 02/18/2017  . Elevated hemoglobin A1c 02/18/2017  . Erectile dysfunction  02/18/2017  . Hyperlipidemia 02/18/2017  . History of hemorrhoids 02/18/2017  . Former smoker 02/18/2017  . Chronic low back pain 02/18/2017  . Chronic pain of left knee 02/18/2017   Fredrick Dray T Kynadee [place], OTR/L, CLT  Dudley Cooley 05/29/2020, 4:53 PM  [place] MAIN [facility] SERVICES [address] [place], [place], [address] Phone: [phone]   Fax:  [phone]  Name: Ethan Fitzgerald MRN: [id number] Date of Birth: 06-27-1956

## 2020-05-31 ENCOUNTER — Other Ambulatory Visit: Payer: Self-pay | Admitting: Family Medicine

## 2020-05-31 DIAGNOSIS — E782 Mixed hyperlipidemia: Secondary | ICD-10-CM

## 2020-06-03 ENCOUNTER — Telehealth: Payer: Self-pay | Admitting: *Deleted

## 2020-06-03 ENCOUNTER — Encounter: Payer: Medicare HMO | Admitting: Speech Pathology

## 2020-06-03 ENCOUNTER — Other Ambulatory Visit: Payer: Self-pay

## 2020-06-03 ENCOUNTER — Ambulatory Visit: Payer: Medicare HMO | Admitting: Occupational Therapy

## 2020-06-03 ENCOUNTER — Ambulatory Visit: Payer: Medicare HMO

## 2020-06-03 ENCOUNTER — Encounter: Payer: Self-pay | Admitting: Occupational Therapy

## 2020-06-03 DIAGNOSIS — R41841 Cognitive communication deficit: Secondary | ICD-10-CM | POA: Diagnosis not present

## 2020-06-03 DIAGNOSIS — M6281 Muscle weakness (generalized): Secondary | ICD-10-CM | POA: Diagnosis not present

## 2020-06-03 DIAGNOSIS — R278 Other lack of coordination: Secondary | ICD-10-CM | POA: Diagnosis not present

## 2020-06-03 NOTE — Therapy (Signed)
[place] MAIN [facility] SERVICES [address] [place], [place], [address] Phone: [phone]   Fax:  [phone]  Occupational Therapy Progress/Discharge Note  Dates of reporting period  04/15/2020   to   06/03/2020  Patient Details  Name: Ethan Fitzgerald MRN: [id number] Date of Birth: [birth year] Referring Provider (OT): Kirsteins   Encounter Date: 06/03/2020   OT End of Session - 06/03/20 1406    Visit Number 10    Number of Visits 24    Date for OT Re-Evaluation 07/08/20    OT Start Time 1400    OT Stop Time 1445    OT Time Calculation (min) 45 min    Activity Tolerance Patient tolerated treatment well    Behavior During Therapy [facility] for tasks assessed/performed           Past Medical History:  Diagnosis Date  . COPD (chronic obstructive pulmonary disease) (HCC)    mild  . COVID-19 09/2019  . Erectile dysfunction   . Headache    daily, stress headaches  . Knee pain, left   . Seasonal allergies   . Tobacco dependence     Past Surgical History:  Procedure Laterality Date  . BACK SURGERY     metal plate in back  . COLONOSCOPY    . COLONOSCOPY WITH PROPOFOL N/A 04/26/2019   Procedure: COLONOSCOPY WITH PROPOFOL;  Surgeon: Jonathon Bellows, MD;  Location: [facility] ENDOSCOPY;  Service: Gastroenterology;  Laterality: N/A;  . HERNIA REPAIR Right   . IR ANGIO INTRA EXTRACRAN SEL COM CAROTID INNOMINATE UNI L MOD SED  03/27/2020  . IR ANGIO VERTEBRAL SEL SUBCLAVIAN INNOMINATE UNI L MOD SED  03/27/2020  . IR ANGIO VERTEBRAL SEL VERTEBRAL UNI R MOD SED  03/27/2020  . IR CT HEAD LTD  03/27/2020  . IR PERCUTANEOUS ART THROMBECTOMY/INFUSION INTRACRANIAL INC DIAG ANGIO  03/27/2020  . KNEE ARTHROSCOPY Left 03/28/2015   Procedure: Arthroscopic partial medial meniscectomy plus chondral debridement;  Surgeon: Leanor Kail, MD;  Location: [place];  Service: Orthopedics;  Laterality: Left;  . RADIOLOGY WITH ANESTHESIA N/A 03/27/2020   Procedure: IR  WITH ANESTHESIA;  Surgeon: Luanne Bras, MD;  Location: [place];  Service: Radiology;  Laterality: N/A;    There were no vitals filed for this visit.   Subjective Assessment - 06/03/20 1405    Patient is accompanied by: Family member    Pertinent History TYMEL CONELY is a 64 y.o. left-handed male with history of COPD, daily headaches, dyslipidemia increasing coughing episodes was admitted 03/27/2020 with left-sided weakness, difficulty walking and difficulty speaking.  CT head showed dense M1 segment with occlusive thrombus, occlusion of left ICA age-indeterminate with distal reconstitution of cavernous segment and good perfusion of left MCA and bilateral ACAs.  He received TPA and underwent cerebral angio with partial revascularization of right-MCA dominant division with unsuccessful attempts at revascularization of proximal right ICA.  Follow-up MRI brain showed acute cortical/subcortical right MCA infarct with additional infarcts in right caudate nucleus, right pre and postcentral gyri and mid to anterior frontal lobe with SAH and incidental right parotid neoplasm.    Patient Stated Goals Patient would like to be able to work again, needs to be able to drive to return.    Currently in Pain? No/denies              [facility] OT Assessment - 06/03/20 0001      Coordination   Right 9 Hole Peg Test 27  Left 9 Hole Peg Test 24      Hand Function   Right Hand Grip (lbs) 80    Right Hand Lateral Pinch 25 lbs    Right Hand 3 Point Pinch 21 lbs    Left Hand Grip (lbs) 80    Left Hand Lateral Pinch 25 lbs    Left 3 point pinch 23 lbs           Pt. has made excellent progress with LUE ROM, strength, motor control, and [facility] skills. Pt.'s FOTO score improved to 97 (up from 65 at the intake) Pt. has met his goals. Pt. is now appropriate for discharge from OT services with plans to return to work on Oct. 18th. Pt. was seen for 10 OT treatment vists.No further OT services are warranted at this  time.                 OT Education - 06/03/20 1405    Education Details fine motor coordination, manipulation skills, pinch skills    Person(s) Educated Patient    Methods Explanation;Demonstration    Comprehension Verbalized understanding;Returned demonstration               OT Long Term Goals - 06/03/20 1408      OT LONG TERM GOAL #1   Title Patient will be independent with home exercise program for strengthening and coordination    Baseline Independet with HEP    Time 12    Period Weeks    Status Achieved    Target Date 07/08/20      OT LONG TERM GOAL #2   Title Patient will complete homemaking tasks of vacuuming and sweeping with modified independence and good balance    Baseline Independently    Time 6    Status Achieved      OT LONG TERM GOAL #3   Title Patient will improve left shoulder flexion by 20 degrees to assist with placing items in and out of the closet.    Baseline left shoulder flexion 122    Time 6    Period Weeks    Status Partially Met      OT LONG TERM GOAL #4   Title Pt will improve left UE strength by 1 mm grade to assist with performing household maintenance tasks.    Baseline 5/5 left UE    Time 6    Period Weeks    Status Achieved      OT LONG TERM GOAL #6   Title Patient will perform meal preparation with modified independence.    Baseline Pt. is able to independently perfrom light meal tasks    Time 12    Period Weeks    Status On-going    Target Date 07/08/20      OT LONG TERM GOAL #7   Title Pt will demonstrate ability to use non power tools for home maintenance tasks with good safety awareness and coordination.    Baseline Pt. is now using power, and nonpower tools    Time 12    Period Weeks    Status Achieved      OT LONG TERM GOAL #8   Title Patient will increase FOTO score to equal to or greater than 76  to demonstrate statistically significant improvement in ADL/IADLs and quality of life.    Baseline FOTO   97    Time 12    Period Weeks    Status Achieved  Plan - 06/03/20 1406    Clinical Impression Statement Pt. has made excellent progress with LUE ROM, strength, motor control, and [place] skills. Pt.'s FOTO score improved to 97 (up from 65 at the intake) Pt. has met his goals. Pt. is now appropriate for discharge from OT services with plans to return to work on Oct. 18th. Pt. was seen for 10 OT treatment vists.No further OT services are warranted at this time.   OT Occupational Profile and History Detailed Assessment- Review of Records and additional review of physical, cognitive, psychosocial history related to current functional performance    Occupational performance deficits (Please refer to evaluation for details): ADL's;IADL's;Work;Leisure    Body Structure / Function / Physical Skills ADL;Dexterity;ROM;Strength;Vision;Balance;Coordination;FMC;IADL;Pain;UE functional use;GMC    Psychosocial Skills Environmental  Adaptations;Routines and Behaviors;Habits    Rehab Potential Good    Clinical Decision Making Limited treatment options, no task modification necessary    Comorbidities Affecting Occupational Performance: May have comorbidities impacting occupational performance    Modification or Assistance to Complete Evaluation  No modification of tasks or assist necessary to complete eval    OT Frequency 2x / week    OT Duration 12 weeks    OT Treatment/Interventions Self-care/ADL training;Cryotherapy;Therapeutic exercise;DME and/or AE instruction;Functional Mobility Training;Balance training;Neuromuscular education;Manual Therapy;Visual/perceptual remediation/compensation;Moist Heat;Contrast Bath;Therapeutic activities;Patient/family education    Consulted and Agree with Plan of Care Patient           Patient will benefit from skilled therapeutic intervention in order to improve the following deficits and impairments:   Body Structure / Function / Physical Skills: ADL,  Dexterity, ROM, Strength, Vision, Balance, Coordination, FMC, IADL, Pain, UE functional use, GMC   Psychosocial Skills: Environmental  Adaptations, Routines and Behaviors, Habits   Visit Diagnosis: Muscle weakness (generalized)  Other lack of coordination    Problem List Patient Active Problem List   Diagnosis Date Noted  . History of cerebrovascular accident (CVA) with residual deficit 04/16/2020  . ICAO (internal carotid artery occlusion), left 04/16/2020  . Hemiplegia of dominant side following cerebrovascular accident (CVA) ([place]) 04/16/2020  . 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 Parotid mass 03/31/2020  . Carotid stenosis 03/31/2020  . Arterial hypotension 03/31/2020  . Dysphagia due to recent stroke 03/31/2020  . COPD without exacerbation ([place]) 03/31/2020  . Hypokalemia 03/31/2020  . Right shoulder pain 03/31/2020  . SAH (subarachnoid hemorrhage) ([place]) secondary to IR 03/31/2020  . Stroke (cerebrum) (HCC) - mult B anterior infarcts s/p tPA + R MCA revascularizion. 03/27/2020  . Middle cerebral artery embolism, right 03/27/2020  . History of COVID-19 12/04/2019  . Centrilobular emphysema ([place]) 04/03/2019  . Seasonal allergies 02/18/2017  . Elevated hemoglobin A1c 02/18/2017  . Erectile dysfunction 02/18/2017  . Hyperlipidemia 02/18/2017  . History of hemorrhoids 02/18/2017  . Former smoker 02/18/2017  . Chronic low back pain 02/18/2017  . Chronic pain of left knee 02/18/2017    Harrel Carina, MS, OTR/L 06/03/2020, 2:42 PM  [place] MAIN [facility] SERVICES [address] [place], [place], [address] Phone: [phone]   Fax:  [phone]  Name: Ethan Fitzgerald MRN: [id number] Date of Birth: December 30, 1955

## 2020-06-03 NOTE — Telephone Encounter (Signed)
Mrs Kilker called and is requesting a note for Ethan Fitzgerald to return to work and about his condition.

## 2020-06-03 NOTE — Telephone Encounter (Signed)
Not clear on this does she want a note so he could return to work or a work excuse?  I recall that he had a physical job and do not think he can return to it.  Where there are any forms from the HR department or any insurance forms that she needed or simply just a letter.

## 2020-06-04 NOTE — Telephone Encounter (Signed)
Spoke to patient and [name]. She states that she recalls in last visit that it was mention that patient can return to work with restrictions with no high ladders. Patient states he can return to work Oct. 18 and that he has been driving some and has been doing well. [name] states HR has no forms need return to work note with diagnosis.

## 2020-06-04 NOTE — Telephone Encounter (Signed)
Notified by VM with information and to call us back with details,

## 2020-06-05 ENCOUNTER — Encounter: Payer: Medicare HMO | Admitting: Speech Pathology

## 2020-06-05 ENCOUNTER — Ambulatory Visit: Payer: Medicare HMO

## 2020-06-05 ENCOUNTER — Ambulatory Visit: Payer: Medicare HMO | Admitting: Occupational Therapy

## 2020-06-06 ENCOUNTER — Ambulatory Visit (HOSPITAL_COMMUNITY): Admission: RE | Admit: 2020-06-06 | Payer: Medicare HMO | Source: Ambulatory Visit

## 2020-06-06 ENCOUNTER — Encounter: Payer: Self-pay | Admitting: Physical Medicine & Rehabilitation

## 2020-06-09 ENCOUNTER — Telehealth: Payer: Self-pay

## 2020-06-09 NOTE — Telephone Encounter (Signed)
Per wife:  Ethan Fitzgerald return to work note need include he had a stroke, along with limitations. No details needed.   Please call [phone] when completed.   (She is aware you are out of the office for a few days).

## 2020-06-11 NOTE — Telephone Encounter (Signed)
I think I printed this on Friday , please check printer in my office

## 2020-06-17 NOTE — Telephone Encounter (Signed)
Can you please revise patients return to work letter, with a diagnosis of stroke?

## 2020-06-18 ENCOUNTER — Other Ambulatory Visit (HOSPITAL_COMMUNITY): Payer: Self-pay | Admitting: Interventional Radiology

## 2020-06-18 ENCOUNTER — Encounter: Payer: Self-pay | Admitting: Physical Medicine & Rehabilitation

## 2020-06-18 ENCOUNTER — Telehealth (HOSPITAL_COMMUNITY): Payer: Self-pay | Admitting: Radiology

## 2020-06-18 DIAGNOSIS — I639 Cerebral infarction, unspecified: Secondary | ICD-10-CM

## 2020-06-18 NOTE — Telephone Encounter (Signed)
Called pt, left VM for him to call to schedule his f/u CTA head/neck per Deveshwar. Pt's authorization expires on 07/06/20. I left that on his VM too. JM

## 2020-06-19 ENCOUNTER — Telehealth: Payer: Self-pay

## 2020-06-19 NOTE — Telephone Encounter (Signed)
Letter has been changed & faxed. Mrs. Mccarthy notified.

## 2020-06-19 NOTE — Telephone Encounter (Signed)
RTW note faxed on 06/19/2020 to Veryl Speak with [employer], fax # [phone]. Fax number confirmed by Mrs. Kruzel.

## 2020-06-20 ENCOUNTER — Ambulatory Visit (INDEPENDENT_AMBULATORY_CARE_PROVIDER_SITE_OTHER): Payer: Medicare HMO | Admitting: Pharmacist

## 2020-06-20 DIAGNOSIS — E782 Mixed hyperlipidemia: Secondary | ICD-10-CM

## 2020-06-20 DIAGNOSIS — I6930 Unspecified sequelae of cerebral infarction: Secondary | ICD-10-CM

## 2020-06-20 DIAGNOSIS — J432 Centrilobular emphysema: Secondary | ICD-10-CM

## 2020-06-20 NOTE — Chronic Care Management (AMB) (Signed)
Chronic Care Management   Follow Up Note   06/20/2020 Name: Ethan Fitzgerald MRN: [id number] DOB: 03/15/1956  Referred by: Ethan Hauser, DO Reason for referral : Chronic Care Management (Patient Phone Call)   Ethan Fitzgerald is a 64 y.o. year old male who is a primary care patient of Ethan Hauser, DO. The CCM team was consulted for assistance with chronic disease management and care coordination needs.    I reached out to Ethan Fitzgerald by phone today.   Review of patient status, including review of consultants reports, relevant laboratory and other test results, and collaboration with appropriate care team members and the patient's provider was performed as part of comprehensive patient evaluation and provision of chronic care management services.    SDOH (Social Determinants of Health) assessments performed: No See Care Plan activities for detailed interventions related to [facility])     Outpatient Encounter Medications as of 06/20/2020  Medication Sig  . albuterol (VENTOLIN HFA) 108 (90 Base) MCG/ACT inhaler Inhale 1-2 puffs into the lungs every 4 (four) hours as needed for wheezing or shortness of breath.  Marland Kitchen aspirin EC 325 MG EC tablet Take 1 tablet (325 mg total) by mouth daily.  . clopidogrel (PLAVIX) 75 MG tablet Take 1 tablet (75 mg total) by mouth daily.  . fludrocortisone (FLORINEF) 0.1 MG tablet Take 1 tablet (0.1 mg total) by mouth daily.  . pantoprazole (PROTONIX) 40 MG tablet Take 1 tablet (40 mg total) by mouth daily before supper.  . rosuvastatin (CRESTOR) 40 MG tablet Take 1 tablet (40 mg total) by mouth at bedtime.  . Tiotropium Bromide-Olodaterol (STIOLTO RESPIMAT) 2.5-2.5 MCG/ACT AERS Inhale 2 puffs into the lungs daily.  . diclofenac Sodium (VOLTAREN) 1 % GEL Apply 2 g topically 4 (four) times daily.  . vitamin C (ASCORBIC ACID) 500 MG tablet Take 500 mg by mouth daily. Am   No facility-administered encounter medications on file as of 06/20/2020.     Goals Addressed              This Visit's Progress   .  PharmD - "I can't afford my inhaler" (pt-stated)        CARE PLAN ENTRY (see longtitudinal plan of care for additional care plan information)  Current Barriers:  . Financial Barriers in complicated patient with multiple medical conditions including COPD, HLD, s/p CVA in 03/2020; patient has [employer] Medicare Part D insurance and reports copay for Anoro inhaler is cost prohibitive at this time  Pharmacist Clinical Goal(s):  Marland Kitchen Over the next 90 days, patient will work with PharmD and providers to relieve medication access concerns . Over the next 90 days, patient will work with PharmD and providers to maintain LDL goal < 70 . Over the next 90 days, patient will work with PharmD and providers to maintain BP goal <130/90; SBP >110 (per Cardiology)  Interventions: - Reports patient is planning to return to work on 10/18 - Counsel on importance of medication adherence  Report using weekly pillbox tool as filled by wife to aid with adherence.  Denies missed doses  Confirms using maintenance inhaler (Stiolto) daily as directed and rescue inhaler (albuterol) as needed.   Reports needing albuterol only occasionally  Confirms patient taking new dose of rosuvastatin, 40 mg, daily as directed  Again review AVS from last Neurology visit on 9/1 with wife. Confirms understanding of plan from provider at that time to: "Continue DAPT for additional 2 months and then aspirin alone", noting this  change would occur ~11/1. States that she will call Neurology to confirm this plan and follow accordingly - Discuss importance of blood pressure monitoring  Reports patient taking: fludrocortisone 0.1 mg daily as directed by Cardiologist  Reports has been checking at home using upper arm monitor, but not keeping log  Recall recent readings ranging: 120s-130s/~70s-80s  Encourage patient/wife to continue to monitor at home, resume keeping log of  results and follow up with provider for readings outside of established parameters - Discuss medication assistance options  Has switched to [facility] mail order for cost savings  Based on reported current income, patient does not meet income limit for [employer] patient assistance program from [employer]  Reported able to afford copayment for inhaler at this time.  Plan to follow up at end of calendar year to reassess medication assistance options - Coordination of care  Reports has scheduled CT appointment on 10/20  Reports will call PCP office to schedule appointment  Patient Self Care Activities:  . Patient to attend scheduled medical appointments o Next appointment with Neurology on 09/09/20  Please see past updates related to this goal by clicking on the "Past Updates" button in the selected goal         Plan  Telephone follow up appointment with care management team member scheduled for: 11/8 at 12:30 pm  Ethan Fitzgerald, PharmD, [place] [phone]

## 2020-06-20 NOTE — Patient Instructions (Signed)
Thank you allowing the Chronic Care Management Team to be a part of your care! It was a pleasure speaking with you today!     CCM (Chronic Care Management) Team    Noreene Larsson RN, MSN, CCM Nurse Care Coordinator  [phone]   Harlow Asa PharmD  Clinical Pharmacist  [phone]   Eula Fried LCSW Clinical Social Worker [phone]  Visit Information  Goals Addressed              This Visit's Progress     PharmD - "I can't afford my inhaler" (pt-stated)        CARE PLAN ENTRY (see longtitudinal plan of care for additional care plan information)  Current Barriers:   Financial Barriers in complicated patient with multiple medical conditions including COPD, HLD, s/p CVA in 03/2020; patient has [employer] Medicare Part D insurance and reports copay for Anoro inhaler is cost prohibitive at this time  Pharmacist Clinical Goal(s):   Over the next 90 days, patient will work with PharmD and providers to relieve medication access concerns  Over the next 90 days, patient will work with PharmD and providers to maintain LDL goal < 70  Over the next 90 days, patient will work with PharmD and providers to maintain BP goal <130/90; SBP >110 (per Cardiology)  Interventions: - Reports patient is planning to return to work on 10/18 - Counsel on importance of medication adherence  Report using weekly pillbox tool as filled by wife to aid with adherence.  Denies missed doses  Confirms using maintenance inhaler (Stiolto) daily as directed and rescue inhaler (albuterol) as needed.   Reports needing albuterol only occasionally  Confirms patient taking new dose of rosuvastatin, 40 mg, daily as directed  Again review AVS from last Neurology visit on 9/1 with wife. Confirms understanding of plan from provider at that time to: Continue DAPT for additional 2 months and then aspirin alone, noting this change would occur ~11/1. States that she will call Neurology to confirm this  plan and follow accordingly - Discuss importance of blood pressure monitoring  Reports patient taking: fludrocortisone 0.1 mg daily as directed by Cardiologist  Reports has been checking at home using upper arm monitor, but not keeping log  Recall recent readings ranging: 120s-130s/~70s-80s  Encourage patient/wife to continue to monitor at home, resume keeping log of results and follow up with provider for readings outside of established parameters - Discuss medication assistance options  Has switched to [facility] mail order for cost savings  Based on reported current income, patient does not meet income limit for [employer] patient assistance program from [employer]  Reported able to afford copayment for inhaler at this time.  Plan to follow up at end of calendar year to reassess medication assistance options - Coordination of care  Reports has scheduled CT appointment on 10/20  Reports will call PCP office to schedule appointment  Patient Self Care Activities:   Patient to attend scheduled medical appointments o Next appointment with Neurology on 09/09/20  Please see past updates related to this goal by clicking on the "Past Updates" button in the selected goal         Patient verbalizes understanding of instructions provided today.   Telephone follow up appointment with care management team member scheduled for: 11/8 at 12:30 pm  Harlow Asa, PharmD, [place] [phone]

## 2020-06-24 ENCOUNTER — Other Ambulatory Visit: Payer: Self-pay

## 2020-06-24 NOTE — Patient Outreach (Signed)
[name]  06/24/2020  Ethan Fitzgerald 06-Feb-1956 [id number]  First telephone outreach attempt to obtain mRS. No answer. Left message for returned call.  [facility] Management Assistant [phone]

## 2020-06-25 ENCOUNTER — Other Ambulatory Visit: Payer: Self-pay

## 2020-06-25 ENCOUNTER — Ambulatory Visit: Payer: Self-pay | Admitting: *Deleted

## 2020-06-25 ENCOUNTER — Ambulatory Visit (HOSPITAL_COMMUNITY)
Admission: RE | Admit: 2020-06-25 | Discharge: 2020-06-25 | Disposition: A | Discharge status: Home / Self Care | Payer: Medicare HMO | Source: Ambulatory Visit | Attending: Interventional Radiology | Admitting: Interventional Radiology

## 2020-06-25 DIAGNOSIS — I639 Cerebral infarction, unspecified: Secondary | ICD-10-CM | POA: Diagnosis not present

## 2020-06-25 DIAGNOSIS — I672 Cerebral atherosclerosis: Secondary | ICD-10-CM | POA: Diagnosis not present

## 2020-06-25 DIAGNOSIS — I63233 Cerebral infarction due to unspecified occlusion or stenosis of bilateral carotid arteries: Secondary | ICD-10-CM | POA: Diagnosis not present

## 2020-06-25 DIAGNOSIS — I6389 Other cerebral infarction: Secondary | ICD-10-CM | POA: Diagnosis not present

## 2020-06-25 LAB — POCT I-STAT CREATININE: Creatinine, Ser: 1 mg/dL (ref 0.61–1.24)

## 2020-06-25 MED ORDER — IOHEXOL 350 MG/ML SOLN
80.0000 mL | Freq: Once | INTRAVENOUS | Status: AC | PRN
  Administered 2020-06-25: 80 mL via INTRAVENOUS

## 2020-06-25 NOTE — Telephone Encounter (Signed)
  Answer Assessment - Initial Assessment Questions 1. NAME of MEDICATION: "What medicine are you calling about?"     Asa and plavix 2. QUESTION: "What is your question?" (e.g., medication refill, side effect)     What should be continued at the end of this month.  3. PRESCRIBING HCP: "Who prescribed it?" Reason: if prescribed by specialist, call should be referred to that group.     Neurology 4. SYMPTOMS: "Do you have any symptoms?"     na5. SEVERITY: If symptoms are present, ask "Are they mild, moderate or severe?"     na 6. PREGNANCY:  "Is there any chance that you are pregnant?" "When was your last menstrual period?"     na  Protocols used: MEDICATION QUESTION CALL-A-AH

## 2020-06-25 NOTE — Telephone Encounter (Signed)
Patient's wife with questions regarding continuing the aspirin and or the plavix after this month. CT scan of the head was performed today. She has a letter from Neurology that list both the medications in question. Advised she call that office in the morning for clarification of the medications and for the CT results from today. Stated she understood and would reach out to Neurology tomorrow.

## 2020-06-30 ENCOUNTER — Telehealth: Payer: Self-pay | Admitting: Adult Health

## 2020-06-30 NOTE — Telephone Encounter (Signed)
[place],Joan(wife on DPR) left voicemail asking for a call for clarity on how pt is to continue taking his clopidogrel (PLAVIX) 75 MG tablet & aspirin EC 325 MG EC tablet.  Please call.

## 2020-06-30 NOTE — Telephone Encounter (Signed)
Called wife and clarified that patient will stop Plavix on 07/07/20 and continue with Aspirin 325 mg alone. Wife verbalized understanding, appreciation.

## 2020-07-04 ENCOUNTER — Other Ambulatory Visit: Payer: Self-pay

## 2020-07-04 NOTE — Patient Outreach (Signed)
[facility]) Care Management  07/04/2020  Ethan Fitzgerald [birth year] [id number]  Second telephone outreach attempt to obtain mRS. No answer. Left message for returned call on both numbers in the chart.  [facility] Management Assistant [phone]

## 2020-07-14 ENCOUNTER — Other Ambulatory Visit: Payer: Self-pay

## 2020-07-14 ENCOUNTER — Telehealth: Payer: Self-pay

## 2020-07-14 ENCOUNTER — Telehealth: Payer: Self-pay | Admitting: Pharmacist

## 2020-07-14 NOTE — Patient Outreach (Signed)
[facility]) Care Management  07/14/2020  Ethan Fitzgerald 06/23/1956 [id number]   Telephone outreach to patient-spoke with patient's wife-ok per DPR on file and per wife due to patient's work hours will not be able to speak with him, to obtain mRS was successfully completed. MRS= 1.  [place] Management Assistant [phone]

## 2020-07-14 NOTE — Chronic Care Management (AMB) (Signed)
°  Chronic Care Management   Outreach Note  07/14/2020 Name: Ethan Fitzgerald MRN: [id number] DOB: February 18, 1956  Referred by: Olin Hauser, DO Reason for referral : No chief complaint on file.  Was unable to reach patient via telephone today and have left HIPAA compliant voicemail asking patient to return my call.    Follow Up Plan: Will collaborate with Care Guide to outreach to schedule follow up with me  Harlow Asa, PharmD, [place] Management [phone]

## 2020-07-18 NOTE — Telephone Encounter (Signed)
Pt has been r/s  

## 2020-08-13 ENCOUNTER — Telehealth: Payer: Self-pay

## 2020-08-13 NOTE — Chronic Care Management (AMB) (Signed)
  Care Management   Note  08/13/2020 Name: Ethan Fitzgerald MRN: [id number] DOB: [birth year]  Ethan Fitzgerald is a 64 y.o. year old male who is a primary care patient of Olin Hauser, DO and is actively engaged with the care management team. I reached out to Janene Harvey by phone today to assist with re-scheduling a follow up visit with the Pharmacist  Follow up plan: Unsuccessful telephone outreach attempt made. A HIPAA compliant phone message was left for the patient providing contact information and requesting a return call.  The care management team will reach out to the patient again over the next 4 days.  If patient returns call to provider office, please advise to call [place]  at [place], [place], [place], [place] [address] Direct Dial: [phone] [email or web address]

## 2020-08-20 ENCOUNTER — Telehealth: Payer: Medicare HMO

## 2020-08-20 NOTE — Chronic Care Management (AMB) (Signed)
  Care Management   Note  08/20/2020 Name: Ethan Fitzgerald MRN: [id number] DOB: August 02, 1956  Ethan Fitzgerald is a 64 y.o. year old male who is a primary care patient of Olin Hauser, DO and is actively engaged with the care management team. I reached out to Janene Harvey by phone today to assist with re-scheduling a follow up visit with the Pharmacist  Follow up plan: Telephone appointment with care management team member scheduled for:08/27/2020  Noreene Larsson, [place], [place] Management  [place], [place] [address] Direct Dial: [phone] [email or web address]

## 2020-08-22 ENCOUNTER — Other Ambulatory Visit: Payer: Self-pay | Admitting: Cardiovascular Disease

## 2020-08-27 ENCOUNTER — Ambulatory Visit: Payer: Medicare HMO | Admitting: Pharmacist

## 2020-08-27 DIAGNOSIS — J432 Centrilobular emphysema: Secondary | ICD-10-CM

## 2020-08-27 DIAGNOSIS — I6930 Unspecified sequelae of cerebral infarction: Secondary | ICD-10-CM

## 2020-08-27 NOTE — Chronic Care Management (AMB) (Signed)
Chronic Care Management   Follow Up Note   08/27/2020 Name: Ethan Fitzgerald MRN: [id number] DOB: 01-23-1956  Referred by: Ethan Hauser, DO Reason for referral : No chief complaint on file.   [name] is a 64 y.o. year old male who is a primary care patient of Ethan Hauser, DO. The CCM team was consulted for assistance with chronic disease management and care coordination needs.    Was unable to reach patient via telephone today and have left HIPAA compliant voicemail asking patient to return my call.  Receive call back from patient's wife, Ethan Fitzgerald (listed on DPR in chart).   Review of patient status, including review of consultants reports, relevant laboratory and other test results, and collaboration with appropriate care team members and the patient's provider was performed as part of comprehensive patient evaluation and provision of chronic care management services.    SDOH (Social Determinants of Health) assessments performed: No See Care Plan activities for detailed interventions related to [facility])     Outpatient Encounter Medications as of 08/27/2020  Medication Sig  . albuterol (VENTOLIN HFA) 108 (90 Base) MCG/ACT inhaler Inhale 1-2 puffs into the lungs every 4 (four) hours as needed for wheezing or shortness of breath.  Marland Kitchen aspirin EC 325 MG EC tablet Take 1 tablet (325 mg total) by mouth daily.  . fludrocortisone (FLORINEF) 0.1 MG tablet TAKE 1 TABLET (0.1 MG TOTAL) BY MOUTH DAILY.  . rosuvastatin (CRESTOR) 40 MG tablet Take 1 tablet (40 mg total) by mouth at bedtime.  . Tiotropium Bromide-Olodaterol (STIOLTO RESPIMAT) 2.5-2.5 MCG/ACT AERS Inhale 2 puffs into the lungs daily.  . diclofenac Sodium (VOLTAREN) 1 % GEL Apply 2 g topically 4 (four) times daily.  . vitamin C (ASCORBIC ACID) 500 MG tablet Take 500 mg by mouth daily. Am  . [DISCONTINUED] clopidogrel (PLAVIX) 75 MG tablet Take 1 tablet (75 mg total) by mouth daily.  . [DISCONTINUED]  pantoprazole (PROTONIX) 40 MG tablet Take 1 tablet (40 mg total) by mouth daily before supper. (Patient not taking: Reported on 08/27/2020)   No facility-administered encounter medications on file as of 08/27/2020.    Goals Addressed              This Visit's Progress   .  COMPLETED: PharmD - "I can't afford my inhaler" (pt-stated)        CARE PLAN ENTRY (see longtitudinal plan of care for additional care plan information)  Current Barriers:  . Financial Barriers in complicated patient with multiple medical conditions including COPD, HLD, s/p CVA in 03/2020; patient has [employer] Medicare Part D insurance and reports copay for Anoro inhaler is cost prohibitive at this time  Pharmacist Clinical Goal(s):  Marland Kitchen Over the next 90 days, patient will work with PharmD and providers to relieve medication access concerns . Over the next 90 days, patient will work with PharmD and providers to maintain LDL goal < 70 . Over the next 90 days, patient will work with PharmD and providers to maintain BP goal <130/90; SBP >110 (per Cardiology)  Interventions: Medication Adherence/Medication Management - Patient using weekly pillbox tool as filled by wife to aid with adherence. - Confirms using maintenance inhaler (Stiolto) daily as directed and rescue inhaler (albuterol) as needed.   Reports needing albuterol only occasionally - Reports discontinued clopidogrel on 07/07/2020 as directed by Neurology and patient continuing to take aspirin 325 mg daily   Per note 10/25 telephone note from [facility], wife confirmed plan with  Neurology office: patient will stop Plavix on 07/07/20 and continue with Aspirin 325 mg alone  - Reports patient stopped taking pantoprazole as felt no longer needed/lack of GI symptoms. Denies patient having any new symptoms since stopped.  Encourage to follow up with office for any new or worsening GI symptoms.  Blood Pressure Management  Reports patient taking:  fludrocortisone 0.1 mg daily following directions from Cardiologist  Encourage patient/wife to continue to monitor at home, keep log of results and follow up with provider for readings outside of established parameters  Denies further need for follow up with CM Pharmacist at this time on behalf of patient. Confirms has phone number and will call as needed for future medication questions/concerns.   Patient Self Care Activities:  . Patient to attend scheduled medical appointments o Next appointment with Neurology on 09/09/20  Please see past updates related to this goal by clicking on the "Past Updates" button in the selected goal         Plan 1) Patient's wife denies further need for follow up with CM Pharmacist at this time on behalf of patient.   2) Patient's wife has been provided with contact information for CM Pharmacist and has been advised to call with any medication related questions or concerns.   Ethan Fitzgerald, PharmD, [place] [employer] [phone]

## 2020-08-27 NOTE — Patient Instructions (Signed)
Thank you allowing the Chronic Care Management Team to be a part of your care! It was a pleasure speaking with you today!     CCM (Chronic Care Management) Team    Noreene Larsson RN, MSN, CCM Nurse Care Coordinator  [phone]   Harlow Asa PharmD  Clinical Pharmacist  [phone]   Eula Fried LCSW Clinical Social Worker [phone]  Visit Information  Goals Addressed              This Visit's Progress   .  COMPLETED: PharmD - "I can't afford my inhaler" (pt-stated)        CARE PLAN ENTRY (see longtitudinal plan of care for additional care plan information)  Current Barriers:  . Financial Barriers in complicated patient with multiple medical conditions including COPD, HLD, s/p CVA in 03/2020; patient has [employer] Medicare Part D insurance and reports copay for Anoro inhaler is cost prohibitive at this time  Pharmacist Clinical Goal(s):  Marland Kitchen Over the next 90 days, patient will work with PharmD and providers to relieve medication access concerns . Over the next 90 days, patient will work with PharmD and providers to maintain LDL goal < 70 . Over the next 90 days, patient will work with PharmD and providers to maintain BP goal <130/90; SBP >110 (per Cardiology)  Interventions: Medication Adherence/Medication Management - Patient using weekly pillbox tool as filled by wife to aid with adherence. - Confirms using maintenance inhaler (Stiolto) daily as directed and rescue inhaler (albuterol) as needed.   Reports needing albuterol only occasionally - Reports discontinued clopidogrel on 07/07/2020 as directed by Neurology and patient continuing to take aspirin 325 mg daily   Per note 10/25 telephone note from [facility], wife confirmed plan with Neurology office: patient will stop Plavix on 07/07/20 and continue with Aspirin 325 mg alone  - Reports patient stopped taking pantoprazole as felt no longer needed/lack of GI symptoms. Denies patient having any  new symptoms since stopped.  Encourage to follow up with office for any new or worsening GI symptoms.  Blood Pressure Management  Reports patient taking: fludrocortisone 0.1 mg daily following directions from Cardiologist  Encourage patient/wife to continue to monitor at home, keep log of results and follow up with provider for readings outside of established parameters  Denies further need for follow up with CM Pharmacist at this time on behalf of patient. Confirms has phone number and will call as needed for future medication questions/concerns.   Patient Self Care Activities:  . Patient to attend scheduled medical appointments o Next appointment with Neurology on 09/09/20  Please see past updates related to this goal by clicking on the "Past Updates" button in the selected goal         The patient verbalized understanding of instructions, educational materials, and care plan provided today and declined offer to receive copy of patient instructions, educational materials, and care plan.   1) Patient's wife denies further need for follow up with CM Pharmacist at this time on behalf of patient.   2) Patient's wife has been provided with contact information for CM Pharmacist and has been advised to call with any medication related questions or concerns.   Harlow Asa, PharmD, [place] [employer] [phone]

## 2020-09-02 ENCOUNTER — Ambulatory Visit: Payer: Medicare HMO | Admitting: Adult Health

## 2020-09-08 NOTE — Progress Notes (Signed)
[facility] [address] [place]. [place] [address] [phone]       STROKE FOLLOW UP NOTE  [name] Date of Birth:  [birth year] Medical Record Number:  [id number]   Reason for Referral:  stroke follow up    SUBJECTIVE:   CHIEF COMPLAINT:  Chief Complaint  Patient presents with   Follow-up    He has continued his medications, as prescribed. He would like to discuss the results of his CT angio neck. He has concerns about the level of fatigue he is experiencing at the end of the day.     HPI:   Today, 09/09/2020, [name] returns for stroke follow-up.  Reports residual left hand fingertip numbness and left facial weakness.  Denies residual speech impairment or cognitive deficit.  He has since returned back to driving and working without difficulty although does experience excessive fatigue by the end of the day - he will typically fall asleep in chair watching TV typically worse after increased exertion. He does not always get adequate sleep at night and at times will have to take a " generic OTC sleeping pill".  Denies new or worsening stroke/TIA symptoms.  Remains on aspirin and Crestor for secondary stroke prevention without side effects.  Blood pressure today 142/96. Monitors at home and typically 120-125/80s.  Remains on Florinef 0.1 mg daily as needed typically is SBP less than 120.  He does have multiple questions regarding CTA head/neck that was completed by Dr. Estanislado Pandy 06/2020 which showed continued bilateral ICA occlusions.  No further concerns at this time.    History provided for reference purposes only Initial visit 05/07/2020 JM: Ethan Fitzgerald is being seen for hospital follow-up accompanied by his wife He was discharged home from [facility] on 04/09/2020 Residual deficits left hand numbness with decreased dexterity, left facial weakness, cognitive impairment, right gaze preference and dysarthria  does occasionally have difficulty with swallowing but is  currently maintaining a regular diet Continues to work with outpatient therapy at [facility] with ongoing improvement SLP reported deficits in attention, safety/judgment, awareness, problem solving and memory 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 He does report dizziness and lightheadedness upon standing. Monitors blood pressures at home which has been ranging 110-120/70s over the past week. No further concerns  Stroke admission 03/27/2020 EthanEthan S Riceis a 65 y.o.malewith history of COPDwho presented on 03/27/2020 with L sided weakness. Stroke work-up revealed right MCA and multiple right and left anterior circulation infarcts s/p tPA and IR with partial revascularization of R MCA and s/p angioplasty for acute R ICA occlusion with reocclusion with resultant small SAH, most likely secondary to large vessel disease with questionable dissection due to coughing episode vs hypothyroid state from parotid neoplasm.  Recommended aspirin alone until [facility] resolves and then consider DAPT for 3 months then aspirin alone.  Incidental finding of parotid mass on MRI suspicious for primary neoplasm and recommended ENT for further evaluation.  Carotid stenosis with chronic left ICA occlusion and acute right ICA occlusion s/p R ICA angioplasty with reocclusion and questionable dissection from  cough with long-term BP goal 130-150.  LDL 82 and recommended continuation of Crestor 20 mg daily.  Other stroke risk factors  include 65 advanced age, former tobacco use, former smokeless tobacco use and EtOH use.  No prior stroke history.  Residual deficits of mild dysarthria, left upper and lower facial weakness, dysphagia, and mild left hemiparesis and discharged to CIR for ongoing therapy needs.  Stroke: R MCA and multiple R and L anterior circulation infarcts s/p tPA and IR w/ partial revascularization with resultant small SAH, most likely secondary to large vessel disease with ? dissection due to coughing episodes, vs. Hypercoagulable state from parotid neoplasm   CT head dense R M1. Subtle asymmetric hypodensity R caudate and lentiform nucleus. ASPECTS 8   CTA head &neck R ICA LVO at bifurcation origin w/ cavernous reconstitution and reocclusion proximal R M1. Good MCA collaterals. L ICA occlusion at bifurcation origin. Short severe L ICA cavernous stenosis.  Cerebral angio partial revascularization R MCA dominant division w/ TICI2a/TICI2b. S/p angioplasty acute R ICA occlusion w/ reocclusion   Post IR CT mild to mod RT peri-insular and post frontal convexity contrast stain vs SAH   MRI R MCA frontal lobe infarct w/ possible mild petechial hemorrhage. B ICA siphon high-grade stenosis vs occlusion. R MCA SAH. 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 No source of embolus   LDL82   HgbA1c5.6  VTE prophylaxis - SCDs   No antithromboticprior to admission, now onASA alone due to small SAH. Plan to repeat CT on 7/30, if SAH resolves or nearly resoved, will consider DAPT for 3 months.  Therapy recommendations: CIR- consult placed  Disposition: pending- await for CIR input, if not a candidate will d/c home     ROS:   14 system review of systems performed and negative with exception of see HPI  PMH:  Past Medical History:  Diagnosis Date   COPD (chronic obstructive pulmonary disease) ([place])     mild   COVID-19 09/2019   Erectile dysfunction    Headache    daily, stress headaches   Knee pain, left    Seasonal allergies    Tobacco dependence     PSH:  Past Surgical History:  Procedure Laterality Date   BACK SURGERY     metal plate in back   COLONOSCOPY     COLONOSCOPY WITH PROPOFOL N/A 04/26/2019   Procedure: COLONOSCOPY WITH PROPOFOL;  Surgeon: Jonathon Bellows, MD;  Location: [facility] ENDOSCOPY;  Service: Gastroenterology;  Laterality: N/A;   HERNIA REPAIR Right    IR ANGIO INTRA EXTRACRAN SEL COM CAROTID INNOMINATE UNI L MOD SED  03/27/2020   IR ANGIO VERTEBRAL SEL SUBCLAVIAN INNOMINATE UNI L MOD SED  03/27/2020   IR ANGIO VERTEBRAL SEL VERTEBRAL UNI R MOD SED  03/27/2020   IR CT HEAD LTD  03/27/2020   IR PERCUTANEOUS ART THROMBECTOMY/INFUSION INTRACRANIAL INC DIAG ANGIO  03/27/2020   KNEE ARTHROSCOPY Left 03/28/2015   Procedure: Arthroscopic partial medial meniscectomy plus chondral debridement;  Surgeon: Leanor Kail, MD;  Location: [place];  Service: Orthopedics;  Laterality: Left;   RADIOLOGY WITH ANESTHESIA N/A 03/27/2020   Procedure: IR WITH ANESTHESIA;  Surgeon: Luanne Bras, MD;  Location: [facility];  Service: Radiology;  Laterality: N/A;    Social History:  Social History   Socioeconomic History   Marital status: Married    Spouse name: Not on file   Number  of children: Not on file   Years of education: Not on file   Highest education level: Not on file  Occupational History   Not on file  Tobacco Use   Smoking status: Former Smoker    Packs/day: 1.50    Years: 49.00    Pack years: 73.50    Types: Cigarettes    Start date: 10    Quit date: 09/14/2017    Years since quitting: 2.9   Smokeless tobacco: Former Forensic psychologist Use: Never used  Substance and Sexual Activity   Alcohol use: Yes    Comment: 6 pack on weekend   Drug use: No   Sexual activity: Not on file  Other Topics Concern   Not on file   Social History Narrative   Not on file   Social Determinants of Health   Financial Resource Strain: Not on file  Food Insecurity: Not on file  Transportation Needs: Not on file  Physical Activity: Not on file  Stress: Not on file  Social Connections: Not on file  Intimate Partner Violence: Not on file    Family History:  Family History  Problem Relation Age of Onset   Diabetes Mother    Heart attack Mother 43   Cancer Father        liver    COPD Brother    HIV/AIDS Brother    Prostate cancer Neg Hx    Colon cancer Neg Hx     Medications:   Current Outpatient Medications on File Prior to Visit  Medication Sig Dispense Refill   albuterol (VENTOLIN HFA) 108 (90 Base) MCG/ACT inhaler Inhale 1-2 puffs into the lungs every 4 (four) hours as needed for wheezing or shortness of breath. 18 g 2   aspirin EC 325 MG EC tablet Take 1 tablet (325 mg total) by mouth daily. 90 tablet 0   diclofenac Sodium (VOLTAREN) 1 % GEL Apply 2 g topically 4 (four) times daily. 150 g 1   fludrocortisone (FLORINEF) 0.1 MG tablet TAKE 1 TABLET (0.1 MG TOTAL) BY MOUTH DAILY. 90 tablet 2   rosuvastatin (CRESTOR) 40 MG tablet Take 1 tablet (40 mg total) by mouth at bedtime. 90 tablet 3   Tiotropium Bromide-Olodaterol (STIOLTO RESPIMAT) 2.5-2.5 MCG/ACT AERS Inhale 2 puffs into the lungs daily. 12 g 1   vitamin C (ASCORBIC ACID) 500 MG tablet Take 500 mg by mouth daily. Am     No current facility-administered medications on file prior to visit.    Allergies:  No Known Allergies    OBJECTIVE:  Physical Exam  Vitals:   09/09/20 0921  BP: (!) 142/96  Pulse: 73  Weight: 174 lb (78.9 kg)  Height: 6' (1.829 m)   Body mass index is 23.6 kg/m. No exam data present  General: well developed, well nourished, pleasant middle-age Caucasian male, seated, in no evident distress Head: head normocephalic and atraumatic.   Neck: supple with no carotid or supraclavicular  bruits Cardiovascular: regular rate and rhythm, no murmurs Musculoskeletal: no deformity Skin:  no rash/petichiae Vascular:  Normal pulses all extremities   Neurologic Exam Mental Status: Awake and fully alert.   Mild dysarthria but overall greatly improving.  Oriented to place and time. Recent and remote memory intact. Attention span, concentration and fund of knowledge appropriate during visit.  Mood and affect appropriate.  Cranial Nerves: Pupils equal, briskly reactive to light. Extraocular movements full without nystagmus with right gaze preference but able to cross midline without  difficulty.  Visual fields full to confrontation. Hearing intact. Facial sensation intact.  Left lower facial weakness. Motor: Normal bulk and tone. Normal strength in all tested extremity muscles  Sensory.: intact to touch , pinprick , position and vibratory sensation.  Coordination: Rapid alternating movements normal in all extremities. Finger-to-nose and heel-to-shin performed accurately bilaterally. Gait and Station: Arises from chair without difficulty. Stance is normal. Gait demonstrates normal stride length and balance without use of assistive device Reflexes: 1+ and symmetric. Toes downgoing.       ASSESSMENT: Ethan Fitzgerald is a 65 y.o. year old male presented with left-sided weakness on 03/27/2020 with stroke work-up revealing right MCA and multiple right and left anterior circulation infarcts s/p tPA and IR with partial revascularization and s/p angioplasty for acute right ICA occlusion with reocclusion, infarct most likely secondary to large vessel disease with questionable dissection vs hypercoagulable state from parotid neoplasm. Vascular risk factors include parotid mass, carotid stenosis, HLD, former tobacco use and EtOH use.      PLAN:  1. R MCA stroke, B anterior circulation infarcts:  a. Residual deficit: Left hand fingertip numbness, dysarthria and facial weakness.  Encouraged continued  exercises as advised during therapy sessions b. Continue aspirin 325 mg daily and Crestor 20 mg daily for secondary stroke prevention.  c. Discussed secondary stroke prevention measures and importance of close PCP follow up for aggressive stroke risk factor management  2. Excessive daytime fatigue: Greatest concern from stroke standpoint is possible underlying sleep apnea.  Discussed sleep apnea at length as well as potential treatment options.  He would like to further discuss with his wife and call office if he wishes to proceed with sleep evaluation.  Also encouraged him to follow-up with PCP to evaluate for other underlying causes such as thyroid or vitamin deficiency 3. HLD:  a. LDL goal <70. Recent LDL 82.  On Crestor 20 mg daily per PCP 4. Hypotension:  a. Currently on Florinef 0.1 mg daily managed by cardiology.   b. BP goal 130-150 to ensure adequate perfusion c.  Advised to continue to follow with cardiology for monitoring and management as well as routine monitoring blood pressures at home 5. Carotid stenosis:  a. CTA head/neck 06/25/2020 occlusion of both ICAs at origin with flow present in the right MCA branches b. Attempted to answer questions to the best my ability but did encourage him to reach out to IR in regards to recommended timeframe for follow-up and any other questions    Follow up in 6 months or call earlier if needed   CC:  [facility] provider: Dr. Claudette Laws, Devonne Doughty, DO   Dr. Estanislado Pandy and Earley Abide, PA  I spent 40 minutes of face-to-face and non-face-to-face time with patient.  This included previsit chart review, lab review, study review, order entry, electronic health record documentation, patient education regarding recent stroke and etiology, residual deficits, excessive daytime sleepiness and possible underlying contributing factors with prolonged discussion regarding sleep apnea, multiple questions answered regarding CTA head/neck and bilateral  carotid occlusion, importance of managing stroke risk factors, and answered all other questions to patients satisfaction   [name], AGNP-BC  [facility] [address] [place], [place] [address]  Phone [phone] Fax [phone] Note: This document was prepared with digital dictation and possible smart phrase technology. Any transcriptional errors that result from this process are unintentional.

## 2020-09-09 ENCOUNTER — Ambulatory Visit: Payer: Medicare HMO | Admitting: Adult Health

## 2020-09-09 ENCOUNTER — Encounter: Payer: Self-pay | Admitting: Adult Health

## 2020-09-09 VITALS — BP 142/96 | HR 73 | Ht 72.0 in | Wt 174.0 lb

## 2020-09-09 DIAGNOSIS — G903 Multi-system degeneration of the autonomic nervous system: Secondary | ICD-10-CM

## 2020-09-09 DIAGNOSIS — I639 Cerebral infarction, unspecified: Secondary | ICD-10-CM | POA: Diagnosis not present

## 2020-09-09 DIAGNOSIS — G4719 Other hypersomnia: Secondary | ICD-10-CM | POA: Diagnosis not present

## 2020-09-09 DIAGNOSIS — E785 Hyperlipidemia, unspecified: Secondary | ICD-10-CM

## 2020-09-09 DIAGNOSIS — I6523 Occlusion and stenosis of bilateral carotid arteries: Secondary | ICD-10-CM

## 2020-09-09 NOTE — Progress Notes (Signed)
I agree with the above plan 

## 2020-09-09 NOTE — Patient Instructions (Addendum)
Continue aspirin 325 mg daily  and Crestor  for secondary stroke prevention  Please follow up with your PCP regarding fatigue and checking lab work including thyroid levels and B12 levels  Would recommend undergoing sleep study to assess for sleep apnea potentially contributing to your fatigue  Continue to follow up with PCP regarding cholesterol and blood pressure management  Maintain strict control of hypertension with blood pressure goal below 130/90 and cholesterol with LDL cholesterol (bad cholesterol) goal below 70 mg/dL.       Followup in the future with me in 6 months or call earlier if needed       Thank you for coming to see [place] at [facility]. I hope we have been able to provide you high quality care today.  You may receive a patient satisfaction survey over the next few weeks. We would appreciate your feedback and comments so that we may continue to improve ourselves and the health of our patients.    Sleep Apnea Sleep apnea is a condition in which breathing pauses or becomes shallow during sleep. Episodes of sleep apnea usually last 10 seconds or longer, and they may occur as many as 20 times an hour. Sleep apnea disrupts your sleep and keeps your body from getting the rest that it needs. This condition can increase your risk of certain health problems, including:  Heart attack.  Stroke.  Obesity.  Diabetes.  Heart failure.  Irregular heartbeat. What are the causes? There are three kinds of sleep apnea:  Obstructive sleep apnea. This kind is caused by a blocked or collapsed airway.  Central sleep apnea. This kind happens when the part of the brain that controls breathing does not send the correct signals to the muscles that control breathing.  Mixed sleep apnea. This is a combination of obstructive and central sleep apnea. The most common cause of this condition is a collapsed or blocked airway. An airway can collapse or become blocked  if:  Your throat muscles are abnormally relaxed.  Your tongue and tonsils are larger than normal.  You are overweight.  Your airway is smaller than normal. What increases the risk? You are more likely to develop this condition if you:  Are overweight.  Smoke.  Have a smaller than normal airway.  Are elderly.  Are male.  Drink alcohol.  Take sedatives or tranquilizers.  Have a family history of sleep apnea. What are the signs or symptoms? Symptoms of this condition include:  Trouble staying asleep.  Daytime sleepiness and tiredness.  Irritability.  Loud snoring.  Morning headaches.  Trouble concentrating.  Forgetfulness.  Decreased interest in sex.  Unexplained sleepiness.  Mood swings.  Personality changes.  Feelings of depression.  Waking up often during the night to urinate.  Dry mouth.  Sore throat. How is this diagnosed? This condition may be diagnosed with:  A medical history.  A physical exam.  A series of tests that are done while you are sleeping (sleep study). These tests are usually done in a sleep lab, but they may also be done at home. How is this treated? Treatment for this condition aims to restore normal breathing and to ease symptoms during sleep. It may involve managing health issues that can affect breathing, such as high blood pressure or obesity. Treatment may include:  Sleeping on your side.  Using a decongestant if you have nasal congestion.  Avoiding the use of depressants, including alcohol, sedatives, and narcotics.  Losing weight if you are overweight.  Making changes to your diet.  Quitting smoking.  Using a device to open your airway while you sleep, such as: ? An oral appliance. This is a custom-made mouthpiece that shifts your lower jaw forward. ? A continuous positive airway pressure (CPAP) device. This device blows air through a mask when you breathe out (exhale). ? A nasal expiratory positive airway  pressure (EPAP) device. This device has valves that you put into each nostril. ? A bi-level positive airway pressure (BPAP) device. This device blows air through a mask when you breathe in (inhale) and breathe out (exhale).  Having surgery if other treatments do not work. During surgery, excess tissue is removed to create a wider airway. It is important to get treatment for sleep apnea. Without treatment, this condition can lead to:  High blood pressure.  Coronary artery disease.  In men, an inability to achieve or maintain an erection (impotence).  Reduced thinking abilities. Follow these instructions at home: Lifestyle  Make any lifestyle changes that your health care provider recommends.  Eat a healthy, well-balanced diet.  Take steps to lose weight if you are overweight.  Avoid using depressants, including alcohol, sedatives, and narcotics.  Do not use any products that contain nicotine or tobacco, such as cigarettes, e-cigarettes, and chewing tobacco. If you need help quitting, ask your health care provider. General instructions  Take over-the-counter and prescription medicines only as told by your health care provider.  If you were given a device to open your airway while you sleep, use it only as told by your health care provider.  If you are having surgery, make sure to tell your health care provider you have sleep apnea. You may need to bring your device with you.  Keep all follow-up visits as told by your health care provider. This is important. Contact a health care provider if:  The device that you received to open your airway during sleep is uncomfortable or does not seem to be working.  Your symptoms do not improve.  Your symptoms get worse. Get help right away if:  You develop: ? Chest pain. ? Shortness of breath. ? Discomfort in your back, arms, or stomach.  You have: ? Trouble speaking. ? Weakness on one side of your body. ? Drooping in your  face. These symptoms may represent a serious problem that is an emergency. Do not wait to see if the symptoms will go away. Get medical help right away. Call your local emergency services (911 in the [place] Do not drive yourself to the hospital. Summary  Sleep apnea is a condition in which breathing pauses or becomes shallow during sleep.  The most common cause is a collapsed or blocked airway.  The goal of treatment is to restore normal breathing and to ease symptoms during sleep. This information is not intended to replace advice given to you by your health care provider. Make sure you discuss any questions you have with your health care provider. Document Revised: 02/07/2019 Document Reviewed: 04/18/2018 Elsevier Patient Education  2020 [employer].

## 2020-10-01 ENCOUNTER — Telehealth: Payer: Self-pay

## 2020-10-01 DIAGNOSIS — R5383 Other fatigue: Secondary | ICD-10-CM

## 2020-10-01 DIAGNOSIS — E538 Deficiency of other specified B group vitamins: Secondary | ICD-10-CM

## 2020-10-01 DIAGNOSIS — E559 Vitamin D deficiency, unspecified: Secondary | ICD-10-CM

## 2020-10-01 DIAGNOSIS — E782 Mixed hyperlipidemia: Secondary | ICD-10-CM

## 2020-10-01 DIAGNOSIS — I6930 Unspecified sequelae of cerebral infarction: Secondary | ICD-10-CM

## 2020-10-01 DIAGNOSIS — R531 Weakness: Secondary | ICD-10-CM

## 2020-10-01 NOTE — Telephone Encounter (Signed)
Looks like last visit with me 04/2020. He has done Yearly Physical in Summer, usually July 2021 was last.  I do not have any anticipated or scheduled labs for him.  It looks like his Neurologist on 09/09/20 asked him to follow-up with me for "Fatigue"  They requested:  Thyroid panel (TSH, +Free T4) CBC Vitamin D Vitamin B12  I can place orders and he can schedule apt to do blood only 1 week before apt, schedule anytime in next few weeks.  Then next plan would be July 2022 for annual physical and rest of blood.  [name], [facility] 10/01/2020, 1:26 PM

## 2020-10-01 NOTE — Telephone Encounter (Signed)
Lab orders in.  Sorry for confusion - he does need an office visit either in person or virtual with me 1 week AFTER his blood draw, so we can review his results and give medical advice.  If you can call them back to schedule him for office visit with me after labs result  Thanks  [name], [facility] 10/01/2020, 3:21 PM

## 2020-10-01 NOTE — Telephone Encounter (Signed)
Copied from [place] [id number]. Topic: General - Other >> Sep 29, 2020  2:28 PM Hinda Lenis D wrote: Pt need a lab appt / please advise    Would you mind reviewing what labs the patient may need and I will go ahead and call him to set up a lab appt if you would like?

## 2020-10-01 NOTE — Telephone Encounter (Signed)
I spoke with the patient's wife and made an appt for him to come in for labs next Wed 10/08/2020 per her request.

## 2020-10-01 NOTE — Telephone Encounter (Signed)
We agreed on an in office appt on 2/23

## 2020-10-07 ENCOUNTER — Ambulatory Visit (INDEPENDENT_AMBULATORY_CARE_PROVIDER_SITE_OTHER): Payer: Medicare HMO

## 2020-10-07 ENCOUNTER — Other Ambulatory Visit: Payer: Self-pay | Admitting: *Deleted

## 2020-10-07 VITALS — Ht 72.0 in | Wt 170.0 lb

## 2020-10-07 DIAGNOSIS — Z0000 Encounter for general adult medical examination without abnormal findings: Secondary | ICD-10-CM

## 2020-10-07 DIAGNOSIS — R5383 Other fatigue: Secondary | ICD-10-CM

## 2020-10-07 DIAGNOSIS — E782 Mixed hyperlipidemia: Secondary | ICD-10-CM

## 2020-10-07 DIAGNOSIS — I6930 Unspecified sequelae of cerebral infarction: Secondary | ICD-10-CM

## 2020-10-07 DIAGNOSIS — R531 Weakness: Secondary | ICD-10-CM

## 2020-10-07 DIAGNOSIS — E559 Vitamin D deficiency, unspecified: Secondary | ICD-10-CM

## 2020-10-07 DIAGNOSIS — E538 Deficiency of other specified B group vitamins: Secondary | ICD-10-CM

## 2020-10-07 NOTE — Patient Instructions (Signed)
[name] , Thank you for taking time to come for your Medicare Wellness Visit. I appreciate your ongoing commitment to your health goals. Please review the following plan we discussed and let me know if I can assist you in the future.   Screening recommendations/referrals: Colonoscopy: patient to schedule Recommended yearly ophthalmology/optometry visit for glaucoma screening and checkup Recommended yearly dental visit for hygiene and checkup  Vaccinations: Influenza vaccine: decline Pneumococcal vaccine: decline Tdap vaccine:  Completed 03/18/2017, due 03/19/2027 Shingles vaccine: decline   Covid-19:  01/19/2020, 12/29/2019  Advanced directives: Please bring a copy of your POA (Power of Attorney) and/or Living Will to your next appointment.   Conditions/risks identified: none  Next appointment: Follow up in one year for your annual wellness visit.   Preventive Care 30 Years and Older, Male Preventive care refers to lifestyle choices and visits with your health care provider that can promote health and wellness. What does preventive care include?  A yearly physical exam. This is also called an annual well check.  Dental exams once or twice a year.  Routine eye exams. Ask your health care provider how often you should have your eyes checked.  Personal lifestyle choices, including:  Daily care of your teeth and gums.  Regular physical activity.  Eating a healthy diet.  Avoiding tobacco and drug use.  Limiting alcohol use.  Practicing safe sex.  Taking low doses of aspirin every day.  Taking vitamin and mineral supplements as recommended by your health care provider. What happens during an annual well check? The services and screenings done by your health care provider during your annual well check will depend on your age, overall health, lifestyle risk factors, and family history of disease. Counseling  Your health care provider may ask you questions about  your:  Alcohol use.  Tobacco use.  Drug use.  Emotional well-being.  Home and relationship well-being.  Sexual activity.  Eating habits.  History of falls.  Memory and ability to understand (cognition).  Work and work Statistician. Screening  You may have the following tests or measurements:  Height, weight, and BMI.  Blood pressure.  Lipid and cholesterol levels. These may be checked every 5 years, or more frequently if you are over 54 years old.  Skin check.  Lung cancer screening. You may have this screening every year starting at age 28 if you have a 30-pack-year history of smoking and currently smoke or have quit within the past 15 years.  Fecal occult blood test (FOBT) of the stool. You may have this test every year starting at age 47.  Flexible sigmoidoscopy or colonoscopy. You may have a sigmoidoscopy every 5 years or a colonoscopy every 10 years starting at age 71.  Prostate cancer screening. Recommendations will vary depending on your family history and other risks.  Hepatitis C blood test.  Hepatitis B blood test.  Sexually transmitted disease (STD) testing.  Diabetes screening. This is done by checking your blood sugar (glucose) after you have not eaten for a while (fasting). You may have this done every 1-3 years.  Abdominal aortic aneurysm (AAA) screening. You may need this if you are a current or former smoker.  Osteoporosis. You may be screened starting at age 57 if you are at high risk. Talk with your health care provider about your test results, treatment options, and if necessary, the need for more tests. Vaccines  Your health care provider may recommend certain vaccines, such as:  Influenza vaccine. This is recommended every  year.  Tetanus, diphtheria, and acellular pertussis (Tdap, Td) vaccine. You may need a Td booster every 10 years.  Zoster vaccine. You may need this after age 5.  Pneumococcal 13-valent conjugate (PCV13) vaccine.  One dose is recommended after age 79.  Pneumococcal polysaccharide (PPSV23) vaccine. One dose is recommended after age 78. Talk to your health care provider about which screenings and vaccines you need and how often you need them. This information is not intended to replace advice given to you by your health care provider. Make sure you discuss any questions you have with your health care provider. Document Released: 09/19/2015 Document Revised: 05/12/2016 Document Reviewed: 06/24/2015 Elsevier Interactive Patient Education  2017 [place] Prevention in the Home Falls can cause injuries. They can happen to people of all ages. There are many things you can do to make your home safe and to help prevent falls. What can I do on the outside of my home?  Regularly fix the edges of walkways and driveways and fix any cracks.  Remove anything that might make you trip as you walk through a door, such as a raised step or threshold.  Trim any bushes or trees on the path to your home.  Use bright outdoor lighting.  Clear any walking paths of anything that might make someone trip, such as rocks or tools.  Regularly check to see if handrails are loose or broken. Make sure that both sides of any steps have handrails.  Any raised decks and porches should have guardrails on the edges.  Have any leaves, snow, or ice cleared regularly.  Use sand or salt on walking paths during winter.  Clean up any spills in your garage right away. This includes oil or grease spills. What can I do in the bathroom?  Use night lights.  Install grab bars by the toilet and in the tub and shower. Do not use towel bars as grab bars.  Use non-skid mats or decals in the tub or shower.  If you need to sit down in the shower, use a plastic, non-slip stool.  Keep the floor dry. Clean up any water that spills on the floor as soon as it happens.  Remove soap buildup in the tub or shower regularly.  Attach bath  mats securely with double-sided non-slip rug tape.  Do not have throw rugs and other things on the floor that can make you trip. What can I do in the bedroom?  Use night lights.  Make sure that you have a light by your bed that is easy to reach.  Do not use any sheets or blankets that are too big for your bed. They should not hang down onto the floor.  Have a firm chair that has side arms. You can use this for support while you get dressed.  Do not have throw rugs and other things on the floor that can make you trip. What can I do in the kitchen?  Clean up any spills right away.  Avoid walking on wet floors.  Keep items that you use a lot in easy-to-reach places.  If you need to reach something above you, use a strong step stool that has a grab bar.  Keep electrical cords out of the way.  Do not use floor polish or wax that makes floors slippery. If you must use wax, use non-skid floor wax.  Do not have throw rugs and other things on the floor that can make you trip. What can  I do with my stairs?  Do not leave any items on the stairs.  Make sure that there are handrails on both sides of the stairs and use them. Fix handrails that are broken or loose. Make sure that handrails are as long as the stairways.  Check any carpeting to make sure that it is firmly attached to the stairs. Fix any carpet that is loose or worn.  Avoid having throw rugs at the top or bottom of the stairs. If you do have throw rugs, attach them to the floor with carpet tape.  Make sure that you have a light switch at the top of the stairs and the bottom of the stairs. If you do not have them, ask someone to add them for you. What else can I do to help prevent falls?  Wear shoes that:  Do not have high heels.  Have rubber bottoms.  Are comfortable and fit you well.  Are closed at the toe. Do not wear sandals.  If you use a stepladder:  Make sure that it is fully opened. Do not climb a closed  stepladder.  Make sure that both sides of the stepladder are locked into place.  Ask someone to hold it for you, if possible.  Clearly mark and make sure that you can see:  Any grab bars or handrails.  First and last steps.  Where the edge of each step is.  Use tools that help you move around (mobility aids) if they are needed. These include:  Canes.  Walkers.  Scooters.  Crutches.  Turn on the lights when you go into a dark area. Replace any light bulbs as soon as they burn out.  Set up your furniture so you have a clear path. Avoid moving your furniture around.  If any of your floors are uneven, fix them.  If there are any pets around you, be aware of where they are.  Review your medicines with your doctor. Some medicines can make you feel dizzy. This can increase your chance of falling. Ask your doctor what other things that you can do to help prevent falls. This information is not intended to replace advice given to you by your health care provider. Make sure you discuss any questions you have with your health care provider. Document Released: 06/19/2009 Document Revised: 01/29/2016 Document Reviewed: 09/27/2014 Elsevier Interactive Patient Education  2017 [employer].

## 2020-10-07 NOTE — Progress Notes (Signed)
I connected with Ethan Fitzgerald today by telephone and verified that I am speaking with the correct person using two identifiers. Location patient: home Location provider: work Persons participating in the virtual visit: Ethan Fitzgerald, [employer] LPN.   I discussed the limitations, risks, security and privacy concerns of performing an evaluation and management service by telephone and the availability of in person appointments. I also discussed with the patient that there may be a patient responsible charge related to this service. The patient expressed understanding and verbally consented to this telephonic visit.    Interactive audio and video telecommunications were attempted between this provider and patient, however failed, due to patient having technical difficulties OR patient did not have access to video capability.  We continued and completed visit with audio only.     Vital signs may be patient reported or missing.  Subjective:   Ethan Fitzgerald is a 65 y.o. male who presents for an Initial Medicare Annual Wellness Visit.  Review of Systems     Cardiac Risk Factors include: advanced age (>57men, >15 women);dyslipidemia;male gender     Objective:    Today's Vitals   10/07/20 1516  Weight: 170 lb (77.1 kg)  Height: 6' (1.829 m)   Body mass index is 23.06 kg/m.  Advanced Directives 10/07/2020 05/08/2020 04/07/2020 04/02/2020 04/02/2020 04/26/2019 03/28/2015  Does Patient Have a Medical Advance Directive? Yes Yes - - Yes Yes Yes  Type of Advance Directive [place];Living will - - - - - -  Does patient want to make changes to medical advance directive? - - No - Patient declined No - Patient declined No - Patient declined - -  Copy of [place] in Chart? No - copy requested - - - - - -    Current Medications (verified) Outpatient Encounter Medications as of 10/07/2020  Medication Sig  . albuterol (VENTOLIN HFA) 108 (90 Base) MCG/ACT inhaler  Inhale 1-2 puffs into the lungs every 4 (four) hours as needed for wheezing or shortness of breath.  Marland Kitchen aspirin EC 325 MG EC tablet Take 1 tablet (325 mg total) by mouth daily.  . fludrocortisone (FLORINEF) 0.1 MG tablet TAKE 1 TABLET (0.1 MG TOTAL) BY MOUTH DAILY.  . rosuvastatin (CRESTOR) 40 MG tablet Take 1 tablet (40 mg total) by mouth at bedtime.  . Tiotropium Bromide-Olodaterol (STIOLTO RESPIMAT) 2.5-2.5 MCG/ACT AERS Inhale 2 puffs into the lungs daily.  . vitamin C (ASCORBIC ACID) 500 MG tablet Take 500 mg by mouth daily. Am  . diclofenac Sodium (VOLTAREN) 1 % GEL Apply 2 g topically 4 (four) times daily. (Patient not taking: Reported on 10/07/2020)   No facility-administered encounter medications on file as of 10/07/2020.    Allergies (verified) Patient has no known allergies.   History: Past Medical History:  Diagnosis Date  . COPD (chronic obstructive pulmonary disease) (HCC)    mild  . COVID-19 09/2019  . Erectile dysfunction   . Headache    daily, stress headaches  . Knee pain, left   . Seasonal allergies   . Tobacco dependence    Past Surgical History:  Procedure Laterality Date  . BACK SURGERY     metal plate in back  . COLONOSCOPY    . COLONOSCOPY WITH PROPOFOL N/A 04/26/2019   Procedure: COLONOSCOPY WITH PROPOFOL;  Surgeon: Jonathon Bellows, MD;  [facility] ENDOSCOPY;  Service: Gastroenterology;  Laterality: N/A;  . HERNIA REPAIR Right   . IR ANGIO INTRA EXTRACRAN SEL COM CAROTID INNOMINATE  UNI L MOD SED  03/27/2020  . IR ANGIO VERTEBRAL SEL SUBCLAVIAN INNOMINATE UNI L MOD SED  03/27/2020  . IR ANGIO VERTEBRAL SEL VERTEBRAL UNI R MOD SED  03/27/2020  . IR CT HEAD LTD  03/27/2020  . IR PERCUTANEOUS ART THROMBECTOMY/INFUSION INTRACRANIAL INC DIAG ANGIO  03/27/2020  . KNEE ARTHROSCOPY Left 03/28/2015   Procedure: Arthroscopic partial medial meniscectomy plus chondral debridement;  Surgeon: Leanor Kail, MD;  Location: [facility];  Service: Orthopedics;   Laterality: Left;  . RADIOLOGY WITH ANESTHESIA N/A 03/27/2020   Procedure: IR WITH ANESTHESIA;  Surgeon: Luanne Bras, MD;  Location: [place];  Service: Radiology;  Laterality: N/A;   Family History  Problem Relation Age of Onset  . Diabetes Mother   . Heart attack Mother 41  . Cancer Father        liver   . COPD Brother   . HIV/AIDS Brother   . Prostate cancer Neg Hx   . Colon cancer Neg Hx    Social History   Socioeconomic History  . Marital status: Married    Spouse name: Not on file  . Number of children: Not on file  . Years of education: Not on file  . Highest education level: Not on file  Occupational History  . Not on file  Tobacco Use  . Smoking status: Former Smoker    Packs/day: 1.50    Years: 49.00    Pack years: 73.50    Types: Cigarettes    Start date: 1970    Quit date: 09/14/2017    Years since quitting: 3.0  . Smokeless tobacco: Former Network engineer  . Vaping Use: Never used  Substance and Sexual Activity  . Alcohol use: Yes    Comment: 6 pack on weekend  . Drug use: No  . Sexual activity: Not on file  Other Topics Concern  . Not on file  Social History Narrative  . Not on file   Social Determinants of Health   Financial Resource Strain: Low Risk   . Difficulty of Paying Living Expenses: Not hard at all  Food Insecurity: No Food Insecurity  . Worried About Charity fundraiser in the Last Year: Never true  . Ran Out of Food in the Last Year: Never true  Transportation Needs: No Transportation Needs  . Lack of Transportation (Medical): No  . Lack of Transportation (Non-Medical): No  Physical Activity: Inactive  . Days of Exercise per Week: 0 days  . Minutes of Exercise per Session: 0 min  Stress: No Stress Concern Present  . Feeling of Stress : Not at all  Social Connections: Not on file    Tobacco Counseling Counseling given: Not Answered   Clinical Intake:  Pre-visit preparation completed: Yes  Pain : No/denies pain      Nutritional Status: BMI of 19-24  Normal Nutritional Risks: None Diabetes: No  How often do you need to have someone help you when you read instructions, pamphlets, or other written materials from your doctor or pharmacy?: 1 - Never What is the last grade level you completed in school?: 12th grade  Diabetic? no  Interpreter Needed?: No  Information entered by :: NAllen LPN   Activities of Daily Living In your present state of health, do you have any difficulty performing the following activities: 10/07/2020 04/02/2020  Hearing? N N  Vision? N N  Difficulty concentrating or making decisions? N N  Walking or climbing stairs? N Y  Dressing or  bathing? N Y  Doing errands, shopping? N Y  Quarry manager and eating ? N -  Using the Toilet? N -  In the past six months, have you accidently leaked urine? N -  Do you have problems with loss of bowel control? N -  Managing your Medications? N -  Managing your Finances? N -  Housekeeping or managing your Housekeeping? N -  Some recent data might be hidden    Patient Care Team: Smitty Cords, DO as PCP - General (Family Medicine) Marlowe Sax, RN as Case Manager (General Practice) [place], Jackelyn Poling, [facility] as Pharmacist  Indicate any recent Medical Services you may have received from other than [facility] providers in the past year (date may be approximate).     Assessment:   This is a routine wellness examination for Ethan Fitzgerald.  Hearing/Vision screen No exam data present  Dietary issues and exercise activities discussed: Current Exercise Habits: The patient has a physically strenuous job, but has no regular exercise apart from work.  Goals    . Patient Stated     10/07/2020, no goals    . Quit Smoking    . Quit smoking / using tobacco      Depression Screen PHQ 2/9 Scores 10/07/2020 05/23/2020 04/18/2020 12/27/2019 11/05/2019 05/08/2019 04/03/2019  PHQ - 2 Score 0 0 6 0 0 0 0  PHQ- 9 Score - - 10 - - - -    Fall Risk Fall  Risk  10/07/2020 05/23/2020 04/18/2020 12/27/2019 11/05/2019  Falls in the past year? 1 0 0 0 0  Comment stroke - - - -  Number falls in past yr: 0 - - 0 0  Injury with Fall? 1 - - 0 0  Risk for fall due to : Medication side effect - - - -  Follow up Falls evaluation completed;Education provided;Falls prevention discussed - - Falls evaluation completed Falls evaluation completed    FALL RISK PREVENTION PERTAINING TO THE HOME:  Any stairs in or around the home? Yes  If so, are there any without handrails? No  Home free of loose throw rugs in walkways, pet beds, electrical cords, etc? Yes  Adequate lighting in your home to reduce risk of falls? Yes   ASSISTIVE DEVICES UTILIZED TO PREVENT FALLS:  Life alert? No  Use of a cane, walker or w/c? No  Grab bars in the bathroom? No  Shower chair or bench in shower? Yes  Elevated toilet seat or a handicapped toilet? Yes   TIMED UP AND GO:  Was the test performed? No .    Cognitive Function:     6CIT Screen 10/07/2020  What Year? 0 points  What month? 0 points  What time? 0 points  Count back from 20 0 points  Months in reverse 0 points  Repeat phrase 2 points  Total Score 2    Immunizations Immunization History  Administered Date(s) Administered  . Influenza,inj,Quad PF,6+ Mos 06/22/2018, 06/30/2019  . PFIZER(Purple Top)SARS-COV-2 Vaccination 12/29/2019, 01/19/2020  . Pneumococcal Conjugate-13 04/03/2019  . Tdap 03/18/2017    TDAP status: Up to date  Flu Vaccine status: Declined, Education has been provided regarding the importance of this vaccine but patient still declined. Advised may receive this vaccine at local pharmacy or Health Dept. Aware to provide a copy of the vaccination record if obtained from local pharmacy or Health Dept. Verbalized acceptance and understanding.  Pneumococcal vaccine status: Declined,  Education has been provided regarding the importance of this vaccine but  patient still declined. Advised may receive  this vaccine at local pharmacy or Health Dept. Aware to provide a copy of the vaccination record if obtained from local pharmacy or Health Dept. Verbalized acceptance and understanding.   Covid-19 vaccine status: Completed vaccines  Qualifies for Shingles Vaccine? Yes   Zostavax completed No   Shingrix Completed?: No.    Education has been provided regarding the importance of this vaccine. Patient has been advised to call insurance company to determine out of pocket expense if they have not yet received this vaccine. Advised may also receive vaccine at local pharmacy or Health Dept. Verbalized acceptance and understanding.  Screening Tests Health Maintenance  Topic Date Due  . COVID-19 Vaccine (3 - Pfizer risk 4-dose series) 02/16/2020  . PNA vac Low Risk Adult (2 of 2 - PPSV23) 04/02/2020  . INFLUENZA VACCINE  04/06/2020  . COLONOSCOPY (Pts 45-70yrs Insurance coverage will need to be confirmed)  04/25/2020  . TETANUS/TDAP  03/19/2027  . Hepatitis C Screening  Completed    Health Maintenance  Health Maintenance Due  Topic Date Due  . COVID-19 Vaccine (3 - Pfizer risk 4-dose series) 02/16/2020  . PNA vac Low Risk Adult (2 of 2 - PPSV23) 04/02/2020  . INFLUENZA VACCINE  04/06/2020  . COLONOSCOPY (Pts 45-52yrs Insurance coverage will need to be confirmed)  04/25/2020    Colorectal cancer screening: patient to schedule  Lung Cancer Screening: (Low Dose CT Chest recommended if Age 80-80 years, 30 pack-year currently smoking OR have quit w/in 15years.) does qualify.   Lung Cancer Screening Referral: completed 12/29/2019  Additional Screening:  Hepatitis C Screening: does qualify; Completed 03/27/2019  Vision Screening: Recommended annual ophthalmology exams for early detection of glaucoma and other disorders of the eye. Is the patient up to date with their annual eye exam?  Yes  Who is the provider or what is the name of the office in which the patient attends annual eye exams?  [facility] If pt is not established with a provider, would they like to be referred to a provider to establish care? No .   Dental Screening: Recommended annual dental exams for proper oral hygiene  Community Resource Referral / Chronic Care Management: CRR required this visit?  No   CCM required this visit?  No      Plan:     I have personally reviewed and noted the following in the patient's chart:   . Medical and social history . Use of alcohol, tobacco or illicit drugs  . Current medications and supplements . Functional ability and status . Nutritional status . Physical activity . Advanced directives . List of other physicians . Hospitalizations, surgeries, and ER visits in previous 12 months . Vitals . Screenings to include cognitive, depression, and falls . Referrals and appointments  In addition, I have reviewed and discussed with patient certain preventive protocols, quality metrics, and best practice recommendations. A written personalized care plan for preventive services as well as general preventive health recommendations were provided to patient.     Barb Merino, LPN   09/13/15   Nurse Notes:

## 2020-10-08 ENCOUNTER — Other Ambulatory Visit: Payer: Medicare HMO

## 2020-10-08 ENCOUNTER — Other Ambulatory Visit: Payer: Self-pay

## 2020-10-08 DIAGNOSIS — R5383 Other fatigue: Secondary | ICD-10-CM | POA: Diagnosis not present

## 2020-10-08 DIAGNOSIS — E782 Mixed hyperlipidemia: Secondary | ICD-10-CM | POA: Diagnosis not present

## 2020-10-08 DIAGNOSIS — E538 Deficiency of other specified B group vitamins: Secondary | ICD-10-CM | POA: Diagnosis not present

## 2020-10-08 DIAGNOSIS — R531 Weakness: Secondary | ICD-10-CM | POA: Diagnosis not present

## 2020-10-08 DIAGNOSIS — E559 Vitamin D deficiency, unspecified: Secondary | ICD-10-CM | POA: Diagnosis not present

## 2020-10-08 DIAGNOSIS — I6930 Unspecified sequelae of cerebral infarction: Secondary | ICD-10-CM | POA: Diagnosis not present

## 2020-10-09 LAB — CBC WITH DIFFERENTIAL/PLATELET
Absolute Monocytes: 462 cells/uL (ref 200–950)
Basophils Absolute: 46 cells/uL (ref 0–200)
Basophils Relative: 0.6 %
Eosinophils Absolute: 377 cells/uL (ref 15–500)
Eosinophils Relative: 4.9 %
HCT: 47.3 % (ref 38.5–50.0)
Hemoglobin: 16.4 g/dL (ref 13.2–17.1)
Lymphs Abs: 2187 cells/uL (ref 850–3900)
MCH: 31.9 pg (ref 27.0–33.0)
MCHC: 34.7 g/dL (ref 32.0–36.0)
MCV: 92 fL (ref 80.0–100.0)
MPV: 12.7 fL — ABNORMAL HIGH (ref 7.5–12.5)
Monocytes Relative: 6 %
Neutro Abs: 4628 cells/uL (ref 1500–7800)
Neutrophils Relative %: 60.1 %
Platelets: 225 10*3/uL (ref 140–400)
RBC: 5.14 10*6/uL (ref 4.20–5.80)
RDW: 13.7 % (ref 11.0–15.0)
Total Lymphocyte: 28.4 %
WBC: 7.7 10*3/uL (ref 3.8–10.8)

## 2020-10-09 LAB — COMPLETE METABOLIC PANEL WITH GFR
AG Ratio: 1.9 (calc) (ref 1.0–2.5)
ALT: 18 U/L (ref 9–46)
AST: 18 U/L (ref 10–35)
Albumin: 4.1 g/dL (ref 3.6–5.1)
Alkaline phosphatase (APISO): 63 U/L (ref 35–144)
BUN: 15 mg/dL (ref 7–25)
CO2: 27 mmol/L (ref 20–32)
Calcium: 9.7 mg/dL (ref 8.6–10.3)
Chloride: 110 mmol/L (ref 98–110)
Creat: 1.06 mg/dL (ref 0.70–1.25)
GFR, Est African American: 84 mL/min/{1.73_m2} (ref >=60)
GFR, Est Non African American: 73 mL/min/{1.73_m2} (ref >=60)
Globulin: 2.2 g/dL (calc) (ref 1.9–3.7)
Glucose, Bld: 110 mg/dL — ABNORMAL HIGH (ref 65–99)
Potassium: 4.1 mmol/L (ref 3.5–5.3)
Sodium: 143 mmol/L (ref 135–146)
Total Bilirubin: 1 mg/dL (ref 0.2–1.2)
Total Protein: 6.3 g/dL (ref 6.1–8.1)

## 2020-10-09 LAB — VITAMIN B12: Vitamin B-12: 233 pg/mL (ref 200–1100)

## 2020-10-09 LAB — THYROID PANEL WITH TSH
Free Thyroxine Index: 2 (ref 1.4–3.8)
T3 Uptake: 33 % (ref 22–35)
T4, Total: 6.1 ug/dL (ref 4.9–10.5)
TSH: 2.2 mIU/L (ref 0.40–4.50)

## 2020-10-09 LAB — VITAMIN D 25 HYDROXY (VIT D DEFICIENCY, FRACTURES): Vit D, 25-Hydroxy: 27 ng/mL — ABNORMAL LOW (ref 30–100)

## 2020-10-24 ENCOUNTER — Other Ambulatory Visit: Payer: Self-pay | Admitting: Family Medicine

## 2020-10-29 ENCOUNTER — Ambulatory Visit (INDEPENDENT_AMBULATORY_CARE_PROVIDER_SITE_OTHER): Payer: Medicare HMO | Admitting: Family Medicine

## 2020-10-29 ENCOUNTER — Encounter: Payer: Self-pay | Admitting: Family Medicine

## 2020-10-29 ENCOUNTER — Other Ambulatory Visit: Payer: Self-pay

## 2020-10-29 VITALS — BP 110/80 | HR 83 | Ht 72.0 in | Wt 174.2 lb

## 2020-10-29 DIAGNOSIS — I69359 Hemiplegia and hemiparesis following cerebral infarction affecting unspecified side: Secondary | ICD-10-CM

## 2020-10-29 DIAGNOSIS — I739 Peripheral vascular disease, unspecified: Secondary | ICD-10-CM

## 2020-10-29 DIAGNOSIS — E559 Vitamin D deficiency, unspecified: Secondary | ICD-10-CM | POA: Diagnosis not present

## 2020-10-29 DIAGNOSIS — E538 Deficiency of other specified B group vitamins: Secondary | ICD-10-CM

## 2020-10-29 DIAGNOSIS — J432 Centrilobular emphysema: Secondary | ICD-10-CM

## 2020-10-29 MED ORDER — VITAMIN B-12 1000 MCG PO TABS: 1000.0000 ug | ORAL_TABLET | Freq: Every day | ORAL | Status: DC

## 2020-10-29 NOTE — Progress Notes (Unsigned)
Subjective:    [name], male    DOB: Jan 17, 1956, 65 y.o.   MRN: [id number]  Ethan Fitzgerald is a 65 y.o. male presenting on 10/29/2020 for Hyperlipidemia   HPI  Fatigue Vitamin D Deficiency Vitamin B12 Deficiency Concerns from Neurology recently with worsening fatigue worse at end of day after work. Due for labs today. Not on supplement.  Elevated A1c / Hyperlipidemia (Hypertriglyceridemia)  Prior history A1c 5.3 up to 5.8. Last reading 5.7 (03/2020). He also has had elevated lipids, with some familial component most likely HyperTG  Regarding BP. Most recently followed by Neurology and Cardiology, last 09/2020, he was advised to maintain BP 130-150 to ensure adequate perfusion. He takes Florinef 0.1mg  daily PRN SBP < 120, he ends up actually taking this once every 1-2 weeks approximately. Not regularly.    Depression screen [facility] 2/9 10/07/2020 05/23/2020 04/18/2020  Decreased Interest 0 0 3  Down, Depressed, Hopeless 0 0 3  PHQ - 2 Score 0 0 6  Altered sleeping - - 3  Tired, decreased energy - - 0  Change in appetite - - 0  Feeling bad or failure about yourself  - - 1  Trouble concentrating - - 0  Moving slowly or fidgety/restless - - 0  Suicidal thoughts - - 0  PHQ-9 Score - - 10  Difficult doing work/chores - - Extremely dIfficult    Social History   Tobacco Use  . Smoking status: Former Smoker    Packs/day: 1.50    Years: 49.00    Pack years: 73.50    Types: Cigarettes    Start date: 1970    Quit date: 09/14/2017    Years since quitting: 3.1  . Smokeless tobacco: Former Network engineer  . Vaping Use: Never used  Substance Use Topics  . Alcohol use: Yes    Comment: 6 pack on weekend  . Drug use: No    Review of Systems Per HPI unless specifically indicated above     Objective:    BP 110/80 (BP Location: Left Arm, Cuff Size: Normal)   Pulse 83   Ht 6' (1.829 m)   Wt 174 lb 3.2 oz (79 kg)   SpO2 100%   BMI 23.63 kg/m   Wt Readings  from Last 3 Encounters:  10/29/20 174 lb 3.2 oz (79 kg)  10/07/20 170 lb (77.1 kg)  09/09/20 174 lb (78.9 kg)    Physical Exam Vitals and nursing note reviewed.  Constitutional:      General: He is not in acute distress.    Appearance: He is well-developed and well-nourished. He is not diaphoretic.     Comments: Well-appearing, comfortable, cooperative  HENT:     Head: Normocephalic and atraumatic.     Mouth/Throat:     Mouth: Oropharynx is clear and moist.  Eyes:     General:        Right eye: No discharge.        Left eye: No discharge.     Conjunctiva/sclera: Conjunctivae normal.  Cardiovascular:     Rate and Rhythm: Normal rate.  Pulmonary:     Effort: Pulmonary effort is normal.  Musculoskeletal:        General: No edema.  Skin:    General: Skin is warm and dry.     Findings: No erythema or rash.  Neurological:     Mental Status: He is alert and oriented to person, place, and time.  Psychiatric:  Mood and Affect: Mood and affect normal.        Behavior: Behavior normal.     Comments: Well groomed, good eye contact, normal speech and thoughts    Results for orders placed or performed in visit on 10/07/20  Vitamin B12  Result Value Ref Range   Vitamin B-12 233 200 - 1,100 pg/mL  VITAMIN D 25 Hydroxy (Vit-D Deficiency, Fractures)  Result Value Ref Range   Vit D, 25-Hydroxy 27 (L) 30 - 100 ng/mL  COMPLETE METABOLIC PANEL WITH GFR  Result Value Ref Range   Glucose, Bld 110 (H) 65 - 99 mg/dL   BUN 15 7 - 25 mg/dL   Creat 1.06 0.70 - 1.25 mg/dL   GFR, Est Non African American 73 > OR = 60 mL/min/1.49m2   GFR, Est African American 84 > OR = 60 mL/min/1.16m2   BUN/Creatinine Ratio NOT APPLICABLE 6 - 22 (calc)   Sodium 143 135 - 146 mmol/L   Potassium 4.1 3.5 - 5.3 mmol/L   Chloride 110 98 - 110 mmol/L   CO2 27 20 - 32 mmol/L   Calcium 9.7 8.6 - 10.3 mg/dL   Total Protein 6.3 6.1 - 8.1 g/dL   Albumin 4.1 3.6 - 5.1 g/dL   Globulin 2.2 1.9 - 3.7 g/dL (calc)    AG Ratio 1.9 1.0 - 2.5 (calc)   Total Bilirubin 1.0 0.2 - 1.2 mg/dL   Alkaline phosphatase (APISO) 63 35 - 144 U/L   AST 18 10 - 35 U/L   ALT 18 9 - 46 U/L  CBC with Differential/Platelet  Result Value Ref Range   WBC 7.7 3.8 - 10.8 Thousand/uL   RBC 5.14 4.20 - 5.80 Million/uL   Hemoglobin 16.4 13.2 - 17.1 g/dL   HCT 47.3 38.5 - 50.0 %   MCV 92.0 80.0 - 100.0 fL   MCH 31.9 27.0 - 33.0 pg   MCHC 34.7 32.0 - 36.0 g/dL   RDW 13.7 11.0 - 15.0 %   Platelets 225 140 - 400 Thousand/uL   MPV 12.7 (H) 7.5 - 12.5 fL   Neutro Abs 4,628 1,500 - 7,800 cells/uL   Lymphs Abs 2,187 850 - 3,900 cells/uL   Absolute Monocytes 462 200 - 950 cells/uL   Eosinophils Absolute 377 15 - 500 cells/uL   Basophils Absolute 46 0 - 200 cells/uL   Neutrophils Relative % 60.1 %   Total Lymphocyte 28.4 %   Monocytes Relative 6.0 %   Eosinophils Relative 4.9 %   Basophils Relative 0.6 %  Thyroid Panel With TSH  Result Value Ref Range   T3 Uptake 33 22 - 35 %   T4, Total 6.1 4.9 - 10.5 mcg/dL   Free Thyroxine Index 2.0 1.4 - 3.8   TSH 2.20 0.40 - 4.50 mIU/L      Assessment & Plan:   Problem List Items Addressed This Visit    PAD (peripheral artery disease) (HCC)   Hemiplegia of dominant side following cerebrovascular accident (CVA) ([place])   Centrilobular emphysema ([place])    Other Visit Diagnoses    B12 deficiency    -  Primary   Relevant Medications   vitamin B-12 (CYANOCOBALAMIN) 1000 MCG tablet   Vitamin D deficiency          History of CVA with residual deficits Fatigue - may be due to CVA persistent weakness, easy fatigued and or possible nutritional vitamin Deficiencies - now lab reviewed low B12 and D, will start supplement for each, B12 1,000  mcg daily he declines injection therapy, and D can take 2,000 unit daily  Based on BP reading 110/80 today, we discussed that need to review it again with Dr Rockey Situ to make sure we are aiming for the correct goals. Regarding top number of BP.  For now  keep as you are, they recommended taking the Florinef medication once daily as needed if BP is lower than 120., goal is 130-150 based on their last report.  I will ask Dr Rockey Situ and they can follow up with you, now that has been 6 months  Meds ordered this encounter  Medications  . vitamin B-12 (CYANOCOBALAMIN) 1000 MCG tablet    Sig: Take 1 tablet (1,000 mcg total) by mouth daily.      Follow up plan: Return in about 6 weeks (around 12/10/2020) for 6 weeks fasting lab only then 1 week later Annual Physical.  Future labs ordered for 12/22/20  Add B12, Vitamin D - on supplements, check fatigue. Add PSA. No thyroid needed. Cholesterol added   [name], DO [facility] 10/29/2020, 9:46 AM

## 2020-10-29 NOTE — Patient Instructions (Addendum)
Thank you for coming to the office today.  Recommend taking oral supplement of Vitamin D3 2,000 unit daily.  Recommend taking oral supplement Vitamin B12 1,000 mcg pill daily - your result was 233 which is technically normal but still very low, could cause fatigue. In future, if the pill is not strong enough, we could do B12 injections.  Based on BP reading 110/80 today, we discussed that need to review it again with Dr Rockey Situ to make sure we are aiming for the correct goals. Regarding top number of BP.  For now keep as you are, they recommended taking the Florinef medication once daily as needed if BP is lower than 120., goal is 130-150 based on their last report.  I will ask Dr Rockey Situ and they can follow up with you, now that has been 6 months   DUE for FASTING BLOOD WORK (no food or drink after midnight before the lab appointment, only water or coffee without cream/sugar on the morning of)  SCHEDULE "Lab Only" visit in the morning at the clinic for lab draw in [place]   - Make sure Lab Only appointment is at about 1 week before your next appointment, so that results will be available  For Lab Results, once available within 2-3 days of blood draw, you can can log in to MyChart online to view your results and a brief explanation. Also, we can discuss results at next follow-up visit.   Please schedule a Follow-up Appointment to: Return in about 6 weeks (around 12/10/2020) for 6 weeks fasting lab only then 1 week later Annual Physical.  If you have any other questions or concerns, please feel free to call the office or send a message through [place]. You may also schedule an earlier appointment if necessary.  Additionally, you may be receiving a survey about your experience at our office within a few days to 1 week by e-mail or mail. We value your feedback.  [name], DO [facility]

## 2020-10-30 ENCOUNTER — Other Ambulatory Visit: Payer: Self-pay | Admitting: Family Medicine

## 2020-10-30 DIAGNOSIS — R351 Nocturia: Secondary | ICD-10-CM

## 2020-10-30 DIAGNOSIS — E782 Mixed hyperlipidemia: Secondary | ICD-10-CM

## 2020-10-30 DIAGNOSIS — I69359 Hemiplegia and hemiparesis following cerebral infarction affecting unspecified side: Secondary | ICD-10-CM

## 2020-10-30 DIAGNOSIS — R7309 Other abnormal glucose: Secondary | ICD-10-CM

## 2020-10-30 DIAGNOSIS — E559 Vitamin D deficiency, unspecified: Secondary | ICD-10-CM

## 2020-10-30 DIAGNOSIS — E538 Deficiency of other specified B group vitamins: Secondary | ICD-10-CM

## 2020-10-30 DIAGNOSIS — J432 Centrilobular emphysema: Secondary | ICD-10-CM

## 2020-10-30 DIAGNOSIS — Z0000 Encounter for general adult medical examination without abnormal findings: Secondary | ICD-10-CM

## 2020-10-30 DIAGNOSIS — I739 Peripheral vascular disease, unspecified: Secondary | ICD-10-CM

## 2020-11-03 ENCOUNTER — Telehealth: Payer: Self-pay | Admitting: Family Medicine

## 2020-11-03 NOTE — Telephone Encounter (Signed)
Last visit 10/29/20 see note and discussion, spoke with patient's wife [name], and shared the guidelines given to me after recent question sent to Dr Rockey Situ on chart review.  I made an inquiry to Cardiology regarding his BP goals if still needed 130-150 for perfusion, now 6 months later, or if could reduce that goal, patient only taking Florinef a few times in 2 weeks for example.  Reviewed Dr Donivan Scull response that patient may be able to stop Florinef, and believes not helping much if taking that infrequent. Goal to stay hydrated inc fluid volume and monitor BP, okay to be in lower range. I advised range of 110-130 is acceptable.  May still take Florinef if BP < 100 occasionally.  F/u w/ me in April 2022 and Dr Rockey Situ when ready  [name], [facility] 11/03/2020, 12:57 PM

## 2020-11-21 ENCOUNTER — Telehealth: Payer: Self-pay

## 2020-12-02 ENCOUNTER — Telehealth: Payer: Self-pay | Admitting: *Deleted

## 2020-12-02 NOTE — Telephone Encounter (Signed)
Attempted to contact patient to schedule lung screening. Left message to call Shawn at [phone]. 

## 2020-12-05 ENCOUNTER — Telehealth: Payer: Self-pay | Admitting: *Deleted

## 2020-12-05 NOTE — Telephone Encounter (Signed)
Attempted to call patient to schedule annual lung CT screening.  Patients wife answered.   She took down the phone number [phone] to call and will have her husband call back to schedule.   I told her if he gets a voicemail to leave day of week and time of day that works best for him, as patient works in [place] and has a very busy schedule.

## 2020-12-22 ENCOUNTER — Other Ambulatory Visit: Payer: Self-pay

## 2020-12-22 ENCOUNTER — Other Ambulatory Visit: Payer: Medicare HMO

## 2020-12-22 DIAGNOSIS — Z0000 Encounter for general adult medical examination without abnormal findings: Secondary | ICD-10-CM

## 2020-12-22 DIAGNOSIS — R351 Nocturia: Secondary | ICD-10-CM

## 2020-12-22 DIAGNOSIS — R7309 Other abnormal glucose: Secondary | ICD-10-CM

## 2020-12-22 DIAGNOSIS — E559 Vitamin D deficiency, unspecified: Secondary | ICD-10-CM | POA: Diagnosis not present

## 2020-12-22 DIAGNOSIS — E538 Deficiency of other specified B group vitamins: Secondary | ICD-10-CM | POA: Diagnosis not present

## 2020-12-22 DIAGNOSIS — I739 Peripheral vascular disease, unspecified: Secondary | ICD-10-CM | POA: Diagnosis not present

## 2020-12-22 DIAGNOSIS — I69359 Hemiplegia and hemiparesis following cerebral infarction affecting unspecified side: Secondary | ICD-10-CM

## 2020-12-22 DIAGNOSIS — E782 Mixed hyperlipidemia: Secondary | ICD-10-CM | POA: Diagnosis not present

## 2020-12-22 DIAGNOSIS — J432 Centrilobular emphysema: Secondary | ICD-10-CM

## 2020-12-23 LAB — CBC WITH DIFFERENTIAL/PLATELET
Absolute Monocytes: 563 cells/uL (ref 200–950)
Basophils Absolute: 70 cells/uL (ref 0–200)
Basophils Relative: 0.8 %
Eosinophils Absolute: 422 cells/uL (ref 15–500)
Eosinophils Relative: 4.8 %
HCT: 48.2 % (ref 38.5–50.0)
Hemoglobin: 15.9 g/dL (ref 13.2–17.1)
Lymphs Abs: 2763 cells/uL (ref 850–3900)
MCH: 31.5 pg (ref 27.0–33.0)
MCHC: 33 g/dL (ref 32.0–36.0)
MCV: 95.4 fL (ref 80.0–100.0)
MPV: 13.1 fL — ABNORMAL HIGH (ref 7.5–12.5)
Monocytes Relative: 6.4 %
Neutro Abs: 4981 cells/uL (ref 1500–7800)
Neutrophils Relative %: 56.6 %
Platelets: 213 10*3/uL (ref 140–400)
RBC: 5.05 10*6/uL (ref 4.20–5.80)
RDW: 13.1 % (ref 11.0–15.0)
Total Lymphocyte: 31.4 %
WBC: 8.8 10*3/uL (ref 3.8–10.8)

## 2020-12-23 LAB — COMPLETE METABOLIC PANEL WITH GFR
AG Ratio: 1.8 (calc) (ref 1.0–2.5)
ALT: 16 U/L (ref 9–46)
AST: 20 U/L (ref 10–35)
Albumin: 4.1 g/dL (ref 3.6–5.1)
Alkaline phosphatase (APISO): 60 U/L (ref 35–144)
BUN: 18 mg/dL (ref 7–25)
CO2: 25 mmol/L (ref 20–32)
Calcium: 9.6 mg/dL (ref 8.6–10.3)
Chloride: 108 mmol/L (ref 98–110)
Creat: 1.05 mg/dL (ref 0.70–1.25)
GFR, Est African American: 85 mL/min/{1.73_m2} (ref >=60)
GFR, Est Non African American: 74 mL/min/{1.73_m2} (ref >=60)
Globulin: 2.3 g/dL (calc) (ref 1.9–3.7)
Glucose, Bld: 108 mg/dL — ABNORMAL HIGH (ref 65–99)
Potassium: 3.8 mmol/L (ref 3.5–5.3)
Sodium: 141 mmol/L (ref 135–146)
Total Bilirubin: 1.2 mg/dL (ref 0.2–1.2)
Total Protein: 6.4 g/dL (ref 6.1–8.1)

## 2020-12-23 LAB — VITAMIN B12: Vitamin B-12: 363 pg/mL (ref 200–1100)

## 2020-12-23 LAB — LIPID PANEL
Cholesterol: 121 mg/dL (ref <200)
HDL: 36 mg/dL — ABNORMAL LOW (ref >=40)
LDL Cholesterol (Calc): 56 mg/dL (calc)
Non-HDL Cholesterol (Calc): 85 mg/dL (calc) (ref <130)
Total CHOL/HDL Ratio: 3.4 (calc) (ref <5.0)
Triglycerides: 218 mg/dL — ABNORMAL HIGH (ref <150)

## 2020-12-23 LAB — PSA: PSA: 0.58 ng/mL (ref <=4.0)

## 2020-12-23 LAB — VITAMIN D 25 HYDROXY (VIT D DEFICIENCY, FRACTURES): Vit D, 25-Hydroxy: 39 ng/mL (ref 30–100)

## 2020-12-23 LAB — HEMOGLOBIN A1C
Hgb A1c MFr Bld: 5.6 % of total Hgb (ref <5.7)
Mean Plasma Glucose: 114 mg/dL
eAG (mmol/L): 6.3 mmol/L

## 2020-12-27 ENCOUNTER — Other Ambulatory Visit: Payer: Self-pay | Admitting: Family Medicine

## 2020-12-27 DIAGNOSIS — N529 Male erectile dysfunction, unspecified: Secondary | ICD-10-CM

## 2020-12-27 NOTE — Telephone Encounter (Signed)
Requested medication (s) are due for refill today: -  Requested medication (s) are on the active medication list: no  Last refill:  10/24/20  Future visit scheduled: yes  Notes to clinic:  not on active med list   Requested Prescriptions  Pending Prescriptions Disp Refills   sildenafil (REVATIO) 20 MG tablet [Pharmacy Med Name: SILDENAFIL CITRATE 20 MG TAB] 30 tablet     Sig: TAKE 1 TO 5 TABLETS BY MOUTH ABOUT 30 MINUTES PRIOR TO SEX.START WITH 1 THEN INCREASE      Urology: Erectile Dysfunction Agents Passed - 12/27/2020  3:14 PM      Passed - Last BP in normal range    BP Readings from Last 1 Encounters:  10/29/20 110/80          Passed - Valid encounter within last 12 months    Recent Outpatient Visits           1 month ago B12 deficiency   [place], DO   8 months ago History of cerebrovascular accident (CVA) with residual deficit   [place], DO   9 months ago Acute bronchitis with COPD [facility])   [place], DO   1 year ago Elevated hemoglobin A1c   [place], DO   1 year ago Mixed hyperlipidemia   [place], DO       Future Appointments             In 2 days [facility], Devonne Doughty, [facility], [place]   In 9 months  [facility] [place], [place]

## 2020-12-29 ENCOUNTER — Other Ambulatory Visit: Payer: Self-pay

## 2020-12-29 ENCOUNTER — Encounter: Payer: Self-pay | Admitting: Family Medicine

## 2020-12-29 ENCOUNTER — Ambulatory Visit (INDEPENDENT_AMBULATORY_CARE_PROVIDER_SITE_OTHER): Payer: Medicare HMO | Admitting: Family Medicine

## 2020-12-29 VITALS — BP 132/68 | HR 76 | Temp 98.1°F | Ht 72.0 in | Wt 178.0 lb

## 2020-12-29 DIAGNOSIS — E782 Mixed hyperlipidemia: Secondary | ICD-10-CM

## 2020-12-29 DIAGNOSIS — I6930 Unspecified sequelae of cerebral infarction: Secondary | ICD-10-CM | POA: Diagnosis not present

## 2020-12-29 DIAGNOSIS — Z0000 Encounter for general adult medical examination without abnormal findings: Secondary | ICD-10-CM

## 2020-12-29 DIAGNOSIS — J432 Centrilobular emphysema: Secondary | ICD-10-CM

## 2020-12-29 DIAGNOSIS — R7309 Other abnormal glucose: Secondary | ICD-10-CM | POA: Diagnosis not present

## 2020-12-29 DIAGNOSIS — I69359 Hemiplegia and hemiparesis following cerebral infarction affecting unspecified side: Secondary | ICD-10-CM | POA: Diagnosis not present

## 2020-12-29 DIAGNOSIS — I739 Peripheral vascular disease, unspecified: Secondary | ICD-10-CM | POA: Diagnosis not present

## 2020-12-29 NOTE — Progress Notes (Signed)
Subjective:    [name], male    DOB: May 06, 1956, 65 y.o.   MRN: [id number]  Ethan Fitzgerald is a 65 y.o. male presenting on 12/29/2020 for Annual Exam   HPI   Here for Annual Physical and Lab Review.  History of CVA, with residual deficits, hemiplegia non dominant side Followed by Neurology He is doing well. No new neuro deficits or events. Still has persistent loss of sensation in fingertips of Left/Right hand. Taking ASA 325mg , admits some bruising occasionally  BP has maintained stable. Rarely lower reading < 120. He took one within past few weeks but not taking often.  HYPERLIPIDEMIA: - Reports no concerns. Last lipid panel 12/2020, controlled LDL 57. Still elevated TG >200. Likely familial. - Currently taking Rosuvastatin 40mg , tolerating well without side effects or myalgias  Elevated A1c Prior results 5.7+, now result down to 5.6. He admits lifestyle drinks 1 beer on sundays, reduced amount. Takes in chocolate still fairly regularly.  Fatigue Vitamin D Deficiency Vitamin B12 Deficiency Improving significantly with Vitamin D up to 39 up from 27, and B12 363, up from 233. Improved on OTC medications. Taking oral replacement therapy. He feels fatigue has improved some but not resolved. Still fatigued and drained after work day.    Additional concern:  Wife not feeling well for past 3 weeks, worried about respiratory illness.  Health Maintenance:  PSA 0.58 reduced. Normal range. No sign prostate cancer screening.  Colonoscopy 04/2019 Dr Vicente Males AGI, had abnormal polyps, repeat in 8 months, overdue now.  Depression screen [facility] 2/9 12/29/2020 10/07/2020 05/23/2020  Decreased Interest 0 0 0  Down, Depressed, Hopeless 0 0 0  PHQ - 2 Score 0 0 0  Altered sleeping 0 - -  Tired, decreased energy 1 - -  Change in appetite 0 - -  Feeling bad or failure about yourself  0 - -  Trouble concentrating 0 - -  Moving slowly or fidgety/restless 0 - -  Suicidal  thoughts 0 - -  PHQ-9 Score 1 - -  Difficult doing work/chores - - -    Past Medical History:  Diagnosis Date  . COPD (chronic obstructive pulmonary disease) (HCC)    mild  . COVID-19 09/2019  . Erectile dysfunction   . Headache    daily, stress headaches  . Knee pain, left   . Seasonal allergies   . Tobacco dependence    Past Surgical History:  Procedure Laterality Date  . BACK SURGERY     metal plate in back  . COLONOSCOPY    . COLONOSCOPY WITH PROPOFOL N/A 04/26/2019   Procedure: COLONOSCOPY WITH PROPOFOL;  Surgeon: Jonathon Bellows, MD;  Location: [facility] ENDOSCOPY;  Service: Gastroenterology;  Laterality: N/A;  . HERNIA REPAIR Right   . IR ANGIO INTRA EXTRACRAN SEL COM CAROTID INNOMINATE UNI L MOD SED  03/27/2020  . IR ANGIO VERTEBRAL SEL SUBCLAVIAN INNOMINATE UNI L MOD SED  03/27/2020  . IR ANGIO VERTEBRAL SEL VERTEBRAL UNI R MOD SED  03/27/2020  . IR CT HEAD LTD  03/27/2020  . IR PERCUTANEOUS ART THROMBECTOMY/INFUSION INTRACRANIAL INC DIAG ANGIO  03/27/2020  . KNEE ARTHROSCOPY Left 03/28/2015   Procedure: Arthroscopic partial medial meniscectomy plus chondral debridement;  Surgeon: Leanor Kail, MD;  Location: [place];  Service: Orthopedics;  Laterality: Left;  . RADIOLOGY WITH ANESTHESIA N/A 03/27/2020   Procedure: IR WITH ANESTHESIA;  Surgeon: Luanne Bras, MD;  Location: [place];  Service: Radiology;  Laterality: N/A;  Social History   Socioeconomic History  . Marital status: Married    Spouse name: Not on file  . Number of children: Not on file  . Years of education: Not on file  . Highest education level: Not on file  Occupational History  . Not on file  Tobacco Use  . Smoking status: Former Smoker    Packs/day: 1.50    Years: 49.00    Pack years: 73.50    Types: Cigarettes    Start date: 1970    Quit date: 09/14/2017    Years since quitting: 3.2  . Smokeless tobacco: Former Network engineer  . Vaping Use: Never used  Substance and Sexual  Activity  . Alcohol use: Yes    Comment: 6 pack on weekend  . Drug use: No  . Sexual activity: Not on file  Other Topics Concern  . Not on file  Social History Narrative  . Not on file   Social Determinants of Health   Financial Resource Strain: Low Risk   . Difficulty of Paying Living Expenses: Not hard at all  Food Insecurity: No Food Insecurity  . Worried About Charity fundraiser in the Last Year: Never true  . Ran Out of Food in the Last Year: Never true  Transportation Needs: No Transportation Needs  . Lack of Transportation (Medical): No  . Lack of Transportation (Non-Medical): No  Physical Activity: Inactive  . Days of Exercise per Week: 0 days  . Minutes of Exercise per Session: 0 min  Stress: No Stress Concern Present  . Feeling of Stress : Not at all  Social Connections: Not on file  Intimate Partner Violence: Not on file   Family History  Problem Relation Age of Onset  . Diabetes Mother   . Heart attack Mother 62  . Cancer Father        liver   . COPD Brother   . HIV/AIDS Brother   . Prostate cancer Neg Hx   . Colon cancer Neg Hx    Current Outpatient Medications on File Prior to Visit  Medication Sig  . albuterol (VENTOLIN HFA) 108 (90 Base) MCG/ACT inhaler Inhale 1-2 puffs into the lungs every 4 (four) hours as needed for wheezing or shortness of breath.  Marland Kitchen aspirin EC 325 MG EC tablet Take 1 tablet (325 mg total) by mouth daily.  . cholecalciferol (VITAMIN D3) 25 MCG (1000 UNIT) tablet Take 1,000 Units by mouth daily.  . fludrocortisone (FLORINEF) 0.1 MG tablet TAKE 1 TABLET (0.1 MG TOTAL) BY MOUTH DAILY. (Patient taking differently: Take 0.1 mg by mouth as needed.)  . rosuvastatin (CRESTOR) 40 MG tablet Take 1 tablet (40 mg total) by mouth at bedtime.  . sildenafil (REVATIO) 20 MG tablet TAKE 1 TO 5 TABLETS BY MOUTH ABOUT 30 MINUTES PRIOR TO SEX.START WITH 1 THEN INCREASE  . Tiotropium Bromide-Olodaterol (STIOLTO RESPIMAT) 2.5-2.5 MCG/ACT AERS Inhale 2  puffs into the lungs daily.  . vitamin B-12 (CYANOCOBALAMIN) 1000 MCG tablet Take 1 tablet (1,000 mcg total) by mouth daily.  . vitamin C (ASCORBIC ACID) 500 MG tablet Take 500 mg by mouth daily. Am   No current facility-administered medications on file prior to visit.    Review of Systems  Constitutional: Negative for activity change, appetite change, chills, diaphoresis, fatigue and fever.  HENT: Negative for congestion and hearing loss.   Eyes: Negative for visual disturbance.  Respiratory: Negative for apnea, cough, chest tightness, shortness of breath and wheezing.  Cardiovascular: Negative for chest pain, palpitations and leg swelling.  Gastrointestinal: Negative for abdominal pain, anal bleeding, blood in stool, constipation, diarrhea, nausea and vomiting.  Endocrine: Negative for cold intolerance.  Genitourinary: Negative for difficulty urinating, dysuria, frequency and hematuria.  Musculoskeletal: Negative for arthralgias, back pain and neck pain.  Skin: Negative for rash.  Allergic/Immunologic: Negative for environmental allergies.  Neurological: Negative for dizziness, weakness, light-headedness, numbness and headaches.  Hematological: Negative for adenopathy.  Psychiatric/Behavioral: Negative for behavioral problems, dysphoric mood and sleep disturbance. The patient is not nervous/anxious.    Per HPI unless specifically indicated above     Objective:    BP 132/68 (BP Location: Left Arm, Cuff Size: Normal)   Pulse 76   Temp 98.1 F (36.7 C) (Oral)   Ht 6' (1.829 m)   Wt 178 lb (80.7 kg)   SpO2 96%   BMI 24.14 kg/m   Wt Readings from Last 3 Encounters:  12/29/20 178 lb (80.7 kg)  10/29/20 174 lb 3.2 oz (79 kg)  10/07/20 170 lb (77.1 kg)    Physical Exam Vitals and nursing note reviewed.  Constitutional:      General: He is not in acute distress.    Appearance: He is well-developed. He is not diaphoretic.     Comments: Well-appearing, comfortable, cooperative   HENT:     Head: Normocephalic and atraumatic.  Eyes:     General:        Right eye: No discharge.        Left eye: No discharge.     Conjunctiva/sclera: Conjunctivae normal.     Pupils: Pupils are equal, round, and reactive to light.  Neck:     Thyroid: No thyromegaly.     Vascular: No carotid bruit.  Cardiovascular:     Rate and Rhythm: Normal rate and regular rhythm.     Heart sounds: Normal heart sounds. No murmur heard.   Pulmonary:     Effort: Pulmonary effort is normal. No respiratory distress.     Breath sounds: Normal breath sounds. No wheezing or rales.  Abdominal:     General: Bowel sounds are normal. There is no distension.     Palpations: Abdomen is soft. There is no mass.     Tenderness: There is no abdominal tenderness.  Musculoskeletal:        General: No tenderness. Normal range of motion.     Cervical back: Normal range of motion and neck supple.     Right lower leg: No edema.     Left lower leg: No edema.     Comments: Upper / Lower Extremities: - Normal muscle tone, strength bilateral upper extremities 5/5, lower extremities 5/5  Lymphadenopathy:     Cervical: No cervical adenopathy.  Skin:    General: Skin is warm and dry.     Findings: No erythema or rash.  Neurological:     Mental Status: He is alert and oriented to person, place, and time.     Comments: Distal sensation intact to light touch all extremities  Psychiatric:        Behavior: Behavior normal.     Comments: Well groomed, good eye contact, normal speech and thoughts       Results for orders placed or performed in visit on 12/22/20  VITAMIN D 25 Hydroxy (Vit-D Deficiency, Fractures)  Result Value Ref Range   Vit D, 25-Hydroxy 39 30 - 100 ng/mL  Vitamin B12  Result Value Ref Range   Vitamin B-12 363 200 -  1,100 pg/mL  PSA  Result Value Ref Range   PSA 0.58 < OR = 4.0 ng/mL  Lipid panel  Result Value Ref Range   Cholesterol 121 <200 mg/dL   HDL 36 (L) > OR = 40 mg/dL    Triglycerides 218 (H) <150 mg/dL   LDL Cholesterol (Calc) 56 mg/dL (calc)   Total CHOL/HDL Ratio 3.4 <5.0 (calc)   Non-HDL Cholesterol (Calc) 85 <130 mg/dL (calc)  COMPLETE METABOLIC PANEL WITH GFR  Result Value Ref Range   Glucose, Bld 108 (H) 65 - 99 mg/dL   BUN 18 7 - 25 mg/dL   Creat 1.05 0.70 - 1.25 mg/dL   GFR, Est Non African American 74 > OR = 60 mL/min/1.25m2   GFR, Est African American 85 > OR = 60 mL/min/1.62m2   BUN/Creatinine Ratio NOT APPLICABLE 6 - 22 (calc)   Sodium 141 135 - 146 mmol/L   Potassium 3.8 3.5 - 5.3 mmol/L   Chloride 108 98 - 110 mmol/L   CO2 25 20 - 32 mmol/L   Calcium 9.6 8.6 - 10.3 mg/dL   Total Protein 6.4 6.1 - 8.1 g/dL   Albumin 4.1 3.6 - 5.1 g/dL   Globulin 2.3 1.9 - 3.7 g/dL (calc)   AG Ratio 1.8 1.0 - 2.5 (calc)   Total Bilirubin 1.2 0.2 - 1.2 mg/dL   Alkaline phosphatase (APISO) 60 35 - 144 U/L   AST 20 10 - 35 U/L   ALT 16 9 - 46 U/L  CBC with Differential/Platelet  Result Value Ref Range   WBC 8.8 3.8 - 10.8 Thousand/uL   RBC 5.05 4.20 - 5.80 Million/uL   Hemoglobin 15.9 13.2 - 17.1 g/dL   HCT 48.2 38.5 - 50.0 %   MCV 95.4 80.0 - 100.0 fL   MCH 31.5 27.0 - 33.0 pg   MCHC 33.0 32.0 - 36.0 g/dL   RDW 13.1 11.0 - 15.0 %   Platelets 213 140 - 400 Thousand/uL   MPV 13.1 (H) 7.5 - 12.5 fL   Neutro Abs 4,981 1,500 - 7,800 cells/uL   Lymphs Abs 2,763 850 - 3,900 cells/uL   Absolute Monocytes 563 200 - 950 cells/uL   Eosinophils Absolute 422 15 - 500 cells/uL   Basophils Absolute 70 0 - 200 cells/uL   Neutrophils Relative % 56.6 %   Total Lymphocyte 31.4 %   Monocytes Relative 6.4 %   Eosinophils Relative 4.8 %   Basophils Relative 0.8 %  Hemoglobin A1c  Result Value Ref Range   Hgb A1c MFr Bld 5.6 <5.7 % of total Hgb   Mean Plasma Glucose 114 mg/dL   eAG (mmol/L) 6.3 mmol/L      Assessment & Plan:   Problem List Items Addressed This Visit    PAD (peripheral artery disease) (HCC)    PAD Prior CVA w/ residual deficit On  Statin, ASA      Hyperlipidemia    Results with TG >200, has likely suggestive of familial HLD component Last lipid 12/2020 Calculated ASCVD 10 yr risk score down to 13-15% now former smoker  Plan: 1. Continue Rosuvastatin 40mg  daily 2. Encourage improved lifestyle - low carb/cholesterol emphasized DASH / Mediterranean diet options, reduce portion size, improve regular exercise      History of cerebrovascular accident (CVA) with residual deficit   Hemiplegia of dominant side following cerebrovascular accident (CVA) (HCC)    Stable, chronic problem now Gradual improved On ASA 325mg  daily Improved A1c, HTN in range, LDL < 70 on  Statin      Elevated hemoglobin A1c    A1c to 5.6 appropriate range. At risk of PreDM in future Concern HLD HyperTG, likely familial Concern now with some possible background diabetic retinopathy  Plan:  1. Not on any therapy currently  2. Encourage improved lifestyle - low carb, low sugar diet, reduce portion size, continue improving regular exercise - Emphasis on reducing chocolate and beer also REDUCE fried foods      Centrilobular emphysema (HCC)    Stable Without flare up On Stiolto       Other Visit Diagnoses    Annual physical exam    -  Primary      Updated Health Maintenance information PSA negative Reviewed recent lab results with patient Encouraged improvement to lifestyle with diet and exercise Goal of weight loss   No orders of the defined types were placed in this encounter.   Follow up plan: Return in about 6 months (around 06/30/2021) for 6 month follow-up PreDM A1c, HTN, History CVA / Neuro updates.  [name], [facility] 12/29/2020, 8:15 AM

## 2020-12-29 NOTE — Assessment & Plan Note (Signed)
Results with TG >200, has likely suggestive of familial HLD component Last lipid 12/2020 Calculated ASCVD 10 yr risk score down to 13-15% now former smoker  Plan: 1. Continue Rosuvastatin 40mg  daily 2. Encourage improved lifestyle - low carb/cholesterol emphasized DASH / Mediterranean diet options, reduce portion size, improve regular exercise

## 2020-12-29 NOTE — Assessment & Plan Note (Signed)
A1c to 5.6 appropriate range. At risk of PreDM in future Concern HLD HyperTG, likely familial Concern now with some possible background diabetic retinopathy  Plan:  1. Not on any therapy currently  2. Encourage improved lifestyle - low carb, low sugar diet, reduce portion size, continue improving regular exercise - Emphasis on reducing chocolate and beer also REDUCE fried foods

## 2020-12-29 NOTE — Assessment & Plan Note (Signed)
Stable, chronic problem now Gradual improved On ASA 325mg  daily Improved A1c, HTN in range, LDL < 70 on Statin

## 2020-12-29 NOTE — Patient Instructions (Addendum)
Thank you for coming to the office today.  Call GI  [facility]) [address], [place] [address] Phone: [phone]  To schedule Colonoscopy due anytime, last 04/2019, repeat 8 months.  Lab results look good overall.  Vitamin B12 363, D is at 66.  Recent Labs    03/27/20 0823 12/22/20 0801  HGBA1C 5.7* 5.6    PSA 0.58 negative normal.  Lipid Panel     Component Value Date/Time   CHOL 121 12/22/2020 0801   TRIG 218 (H) 12/22/2020 0801   HDL 36 (L) 12/22/2020 0801   CHOLHDL 3.4 12/22/2020 0801   VLDL 23 03/27/2020 0823   LDLCALC 56 12/22/2020 0801   Great cholesterol, continue med.  Lungs sound clear  Refilled Sildenafil  Please schedule a Follow-up Appointment to: Return in about 6 months (around 06/30/2021) for 6 month follow-up PreDM A1c, HTN, History CVA / Neuro updates.  If you have any other questions or concerns, please feel free to call the office or send a message through [facility]. You may also schedule an earlier appointment if necessary.  Additionally, you may be receiving a survey about your experience at our office within a few days to 1 week by e-mail or mail. We value your feedback.  [name], DO [place]

## 2020-12-29 NOTE — Assessment & Plan Note (Signed)
Stable Without flare up On Stiolto

## 2020-12-29 NOTE — Assessment & Plan Note (Signed)
PAD Prior CVA w/ residual deficit On Statin, ASA

## 2021-03-12 ENCOUNTER — Other Ambulatory Visit: Payer: Self-pay

## 2021-03-12 ENCOUNTER — Ambulatory Visit: Payer: Medicare HMO | Admitting: Adult Health

## 2021-03-12 ENCOUNTER — Encounter: Payer: Self-pay | Admitting: Adult Health

## 2021-03-12 VITALS — BP 135/93 | HR 80 | Ht 68.0 in | Wt 174.0 lb

## 2021-03-12 DIAGNOSIS — G4719 Other hypersomnia: Secondary | ICD-10-CM

## 2021-03-12 DIAGNOSIS — I951 Orthostatic hypotension: Secondary | ICD-10-CM | POA: Diagnosis not present

## 2021-03-12 DIAGNOSIS — I639 Cerebral infarction, unspecified: Secondary | ICD-10-CM | POA: Diagnosis not present

## 2021-03-12 DIAGNOSIS — I6523 Occlusion and stenosis of bilateral carotid arteries: Secondary | ICD-10-CM

## 2021-03-12 DIAGNOSIS — E785 Hyperlipidemia, unspecified: Secondary | ICD-10-CM

## 2021-03-12 NOTE — Progress Notes (Signed)
[facility] [address] [place]. [place] [address] [phone]       STROKE FOLLOW UP NOTE  Ethan Fitzgerald Date of Birth:  [birth year] Medical Record Number:  [id number]   Reason for Referral:  stroke follow up    SUBJECTIVE:   CHIEF COMPLAINT:  Chief Complaint  Patient presents with   Follow-up    RM 3 alone  Since about June, pt gets lightheaded and dizzy that causes him to stop what he is doing to catch his balance      HPI:   Today, 03/12/2021, Ethan Fitzgerald returns for 72-month stroke follow-up.  Stable from stroke standpoint without new stroke/TIA symptoms.  Reports residual left finger tip numbness.  He continues to work and will have some difficulty with fine motor skills of left hand as he is left-hand dominant but at times able to compensate with his right hand.  Compliant aspirin and Crestor associated side effects.  Blood pressure today 135/93.  Daytime fatigue continues especially towards the end of the day after work.  Completed lab work by PCP which did show vitamin B12 and D deficiency and currently on supplement.  He plans on repeating lab work at the end of next month.  He has not yet had follow-up with Dr. Estanislado Pandy.  He also complains of 1 month onset of lightheadedness and dizziness - usually lasts approx 30 seconds.  Specific triggers include looking up, standing up too quickly from a seated position or when bending over.  He feels like these are the similar symptoms he was previously having with low blood pressure (previously discussed in Septembers appt).  He does endorse drinking 6-8 bottles of water per day and does eat throughout the day.  These typically occur when he is at work so he has not been able to check his blood pressure.  He does have Florinef which he will take as needed for low blood pressure which he will have to use on occasion but typically blood pressure within goal.  Orthostatics: Laying: 132/89, HR 78 Sitting: 129/86, HR  73 Standing: 105/78, HR 83    History provided for reference purposes only Update 09/09/2020 JM: Mr. Delair returns for stroke follow-up.  Reports residual left hand fingertip numbness and left facial weakness.  Denies residual speech impairment or cognitive deficit.  He has since returned back to driving and working without difficulty although does experience excessive fatigue by the end of the day - he will typically fall asleep in chair watching TV typically worse after increased exertion. He does not always get adequate sleep at night and at times will have to take a " generic OTC sleeping pill".  Denies new or worsening stroke/TIA symptoms.  Remains on aspirin and Crestor for secondary stroke prevention without side effects.  Blood pressure today 142/96. Monitors at home and typically 120-125/80s.  Remains on Florinef 0.1 mg daily as needed typically is SBP less than 120.  He does have multiple questions regarding CTA head/neck that was completed by Dr. Estanislado Pandy 06/2020 which showed continued bilateral ICA occlusions.  No further concerns at this time.  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 He does report dizziness and lightheadedness upon standing. Monitors blood pressures at home which has been ranging 110-120/70s over the past week. No further concerns  Stroke admission 03/27/2020 Ethan Fitzgerald is a 65 y.o. male with history of COPD who presented on 03/27/2020 with L sided weakness.  Stroke work-up revealed right MCA and multiple right and left anterior circulation infarcts s/p tPA and IR with partial revascularization of R MCA and s/p angioplasty for acute R ICA occlusion with reocclusion with resultant small SAH, most likely secondary to large vessel disease with questionable dissection due to coughing episode vs hypothyroid state from parotid neoplasm.  Recommended aspirin alone until [place] resolves and then consider DAPT for 3 months then aspirin alone.  Incidental finding of parotid mass on MRI suspicious for primary neoplasm and recommended ENT for further evaluation.  Carotid stenosis with chronic left ICA occlusion and acute right ICA occlusion s/p R ICA angioplasty with reocclusion and questionable dissection from cough with long-term BP goal 130-150.  LDL 82 and recommended continuation of Crestor 20 mg daily.  Other stroke risk factors include advanced age, former tobacco use, former smokeless tobacco use and EtOH use.  No prior stroke history.  Residual deficits of mild dysarthria, left upper and lower facial weakness, dysphagia, and mild left hemiparesis and discharged to CIR for ongoing therapy needs.  Stroke:   R MCA and multiple R and L anterior circulation infarcts s/p tPA and IR w/ partial revascularization with resultant small SAH, most likely secondary to large vessel disease with ? dissection due to coughing episodes, vs.  Hypercoagulable state from parotid neoplasm  CT head dense R M1. Subtle asymmetric hypodensity R caudate and lentiform nucleus. ASPECTS 8   CTA head & neck R ICA LVO at bifurcation origin w/ cavernous reconstitution and reocclusion proximal R M1. Good MCA collaterals. L ICA occlusion at bifurcation origin. Short severe L ICA cavernous stenosis.   Cerebral angio partial revascularization R MCA dominant division w/ TICI2a/TICI2b. S/p angioplasty acute R ICA occlusion w/ reocclusion    Post IR CT mild to mod RT peri-insular and post frontal convexity contrast stain vs SAH  MRI  R MCA frontal lobe infarct w/ possible mild petechial hemorrhage. B ICA siphon high-grade stenosis vs occlusion. R MCA SAH.  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 No source of embolus  LDL 82  HgbA1c 5.6 VTE prophylaxis - SCDs  No antithrombotic prior to admission, now on ASA alone due to small SAH. Plan to repeat CT on 7/30, if SAH resolves or nearly resoved, will consider DAPT for 3 months.  Therapy recommendations:  CIR - consult placed Disposition:  pending - await for CIR input, if not a candidate will d/c home     ROS:   14 system review of systems performed and negative with exception of see HPI  PMH:  Past Medical History:  Diagnosis Date   COPD (chronic obstructive pulmonary disease) ([place])    mild   COVID-19 09/2019   Erectile dysfunction    Headache    daily, stress headaches   Knee pain, left    Seasonal allergies    Tobacco dependence     PSH:  Past Surgical History:  Procedure Laterality Date   BACK SURGERY     metal plate in back   COLONOSCOPY     COLONOSCOPY WITH PROPOFOL N/A 04/26/2019   Procedure: COLONOSCOPY WITH PROPOFOL;  Surgeon: Jonathon Bellows, MD;  Location: [facility];  Service: Gastroenterology;  Laterality: N/A;   HERNIA REPAIR Right    IR ANGIO INTRA EXTRACRAN SEL COM CAROTID  INNOMINATE UNI L MOD SED  03/27/2020   IR ANGIO VERTEBRAL SEL SUBCLAVIAN INNOMINATE UNI L MOD SED  03/27/2020   IR ANGIO VERTEBRAL SEL VERTEBRAL UNI R MOD SED  03/27/2020   IR CT HEAD LTD  03/27/2020   IR PERCUTANEOUS ART THROMBECTOMY/INFUSION INTRACRANIAL INC DIAG ANGIO  03/27/2020   KNEE ARTHROSCOPY Left 03/28/2015   Procedure: Arthroscopic partial medial meniscectomy plus chondral debridement;  Surgeon: Leanor Kail, MD;  Location: [facility];  Service: Orthopedics;  Laterality: Left;   RADIOLOGY WITH ANESTHESIA N/A 03/27/2020   Procedure: IR WITH ANESTHESIA;  Surgeon: Luanne Bras, MD;  Location: [facility];  Service: Radiology;  Laterality: N/A;    Social History:  Social History   Socioeconomic History   Marital status: Married    Spouse name: Not on file   Number of children: Not on file   Years of education: Not on file   Highest education level: Not on file  Occupational History   Not on file  Tobacco Use   Smoking status: Former    Packs/day: 1.50    Years: 49.00    Pack years: 73.50    Types: Cigarettes    Start date: 57    Quit date: 09/14/2017    Years since quitting: 3.4   Smokeless tobacco: Former  Scientific laboratory technician Use: Never used  Substance and Sexual Activity   Alcohol use: Yes    Comment: 6 pack on weekend   Drug use: No   Sexual activity: Not on file  Other Topics Concern   Not on file  Social History Narrative   Not on file   Social Determinants of Health   Financial Resource Strain: Low Risk    Difficulty of Paying Living Expenses: Not hard at all  Food Insecurity: No Food Insecurity   Worried About Charity fundraiser in the Last Year: Never true   [place] in the Last Year: Never true  Transportation Needs: No Transportation Needs   Lack of Transportation (Medical): No   Lack of Transportation (Non-Medical): No  Physical Activity: Inactive   Days of Exercise per Week: 0 days   Minutes of Exercise per Session: 0 min   Stress: No Stress Concern Present   Feeling of Stress : Not at all  Social Connections: Not on file  Intimate Partner Violence: Not on file    Family History:  Family History  Problem Relation Age of Onset   Diabetes Mother    Heart attack Mother 69   Cancer Father        liver    COPD Brother    HIV/AIDS Brother    Prostate cancer Neg Hx    Colon cancer Neg Hx     Medications:  Current Outpatient Medications on File Prior to Visit  Medication Sig Dispense Refill   albuterol (VENTOLIN HFA) 108 (90 Base) MCG/ACT inhaler Inhale 1-2 puffs into the lungs every 4 (four) hours as needed for wheezing or shortness of breath. 18 g 2   aspirin EC 325 MG EC tablet Take 1 tablet (325 mg total) by mouth daily. 90 tablet 0   cholecalciferol (VITAMIN D3) 25 MCG (1000 UNIT) tablet Take 1,000 Units by mouth daily.     fludrocortisone (FLORINEF) 0.1 MG tablet TAKE 1 TABLET (0.1 MG TOTAL) BY MOUTH DAILY. (Patient taking differently: Take 0.1 mg by mouth as needed.) 90 tablet 2   rosuvastatin (CRESTOR) 40 MG tablet Take 1 tablet (40 mg total) by mouth at bedtime. 90 tablet 3   sildenafil (REVATIO) 20 MG tablet TAKE 1 TO 5 TABLETS BY MOUTH ABOUT 30 MINUTES PRIOR TO SEX.START WITH 1 THEN INCREASE 30 tablet 5   Tiotropium Bromide-Olodaterol (STIOLTO RESPIMAT) 2.5-2.5 MCG/ACT AERS Inhale 2 puffs into the lungs daily. 12 g 1   vitamin B-12 (CYANOCOBALAMIN) 1000 MCG tablet Take 1 tablet (1,000 mcg total) by mouth daily.     vitamin C (ASCORBIC ACID) 500 MG tablet Take 500 mg by mouth daily. Am     No current facility-administered medications on file prior to visit.    Allergies:  No Known Allergies    OBJECTIVE:  Physical Exam  Vitals:   03/12/21 0718  BP: (!) 135/93  Pulse: 80  Weight: 174 lb (78.9 kg)  Height: 5\' 8"  (1.727 m)    Body mass index is 26.46 kg/m. No results found.  General: well developed, well nourished, pleasant middle-age Caucasian male, seated, in no evident  distress Head: head normocephalic and atraumatic.   Neck: supple with no carotid or supraclavicular bruits Cardiovascular: regular rate and rhythm, no murmurs Musculoskeletal: no deformity Skin:  no rash/petichiae Vascular:  Normal pulses all extremities   Neurologic Exam Mental Status: Awake and fully alert.  Fluent speech and language.  Oriented to place and time. Recent and remote memory intact. Attention span, concentration and fund of knowledge appropriate during visit.  Mood and affect appropriate.  Cranial Nerves: Pupils equal, briskly reactive to light. Extraocular movements full without nystagmus.  Visual fields full to confrontation. Hearing intact. Facial sensation intact.  Mild left lower facial weakness. Motor: Normal bulk and tone. Normal strength in all tested extremity muscles  Sensory.: intact to touch , pinprick , position and vibratory sensation.  Coordination: Rapid alternating movements normal in all extremities except slight decrease left hand dexterity. Finger-to-nose and heel-to-shin performed accurately bilaterally.  Mildly orbits right arm over left arm Gait and Station: Arises from chair without difficulty. Stance is normal. Gait demonstrates normal stride length and balance without use of assistive device Reflexes: 1+ and symmetric. Toes downgoing.       ASSESSMENT: NNAMDI DACUS is a 66 y.o. year old male presented with left-sided weakness on 03/27/2020 with stroke work-up revealing right MCA and multiple right and left anterior circulation infarcts s/p tPA and IR with partial revascularization and s/p angioplasty for acute right ICA occlusion with reocclusion, infarct most likely secondary to large vessel disease with questionable dissection vs hypercoagulable state from parotid neoplasm. Vascular risk factors include parotid mass, carotid stenosis, HLD, former tobacco use and EtOH use.      PLAN:  R MCA stroke, B anterior circulation infarcts:  Residual  deficit: Left hand fingertip numbness with slight decreased dexterity and facial weakness.   Continue  aspirin 325 mg daily and Crestor 20 mg daily for secondary stroke prevention.  Discussed secondary stroke prevention measures and importance of close PCP follow up for aggressive stroke risk factor management  Excessive daytime fatigue: Greatest continued concern from stroke standpoint is possible underlying sleep apnea.  Previously discussed possible sleep apnea but he declines further evaluation despite increased risks she verbalized understanding.  He was found to have vitamin B12 and D deficiency which he is currently being treated for.  He was advised to contact office if he wishes to pursue sleep evaluation in the future HLD:  LDL goal <70.  Most recent LDL 56 (12/22/2020) on Crestor 20 mg daily per PCP Hypotension:  Orthostatic hypotension: Evidence of SBP drop 20 points when going from sitting to standing -high suspicion this is likely the cause of his dizziness and lightheadedness (see HPI).  Educated on orthostatic hypotension and further interventions as well as possible need of follow-up with cardiology for further discussion. Currently on Florinef 0.1 mg PRN managed by PCP BP goal 130-150 to ensure adequate perfusion Carotid stenosis:  CTA head/neck 06/25/2020 occlusion of both ICAs at origin with flow present in the right MCA branches Will reach out to IR to request assistance with scheduling follow-up visit - he requests calling his wife, [name], to schedule.   Follow up in 6 months or call earlier if needed   CC:  [facility] provider: Dr. Claudette Laws, Devonne Doughty, DO     I spent 46 minutes of face-to-face and non-face-to-face time with patient.  This included previsit chart review, lab review, study review, order entry, electronic health record documentation, patient education regarding recent stroke and etiology as well as secondary stroke prevention measures importance of  aggressive stroke risk factor management, residual deficits, continued daytime fatigue and concern for possible sleep apnea, indication for follow-up with IR, dizziness and lightheadedness with further review of orthostatic hypotension and answered all other questions to patients satisfaction   [name], AGNP-BC  [facility] [address] [place], [place] [address]  Phone [phone] Fax [phone] Note: This document was prepared with digital dictation and possible smart phrase technology. Any transcriptional errors that result from this process are unintentional.

## 2021-03-12 NOTE — Patient Instructions (Addendum)
Your dizziness and lightheadedness is likely from your blood pressure dropping which is called orthostatic hypotension -try to avoid movements that seem to trigger your dizziness as well as check blood pressure during these times.  Continue with good hydration and during hot summer days when you may be sweating, you should try to drink more water or use of Gatorade or Powerade (the sodium content can help prevent your blood pressure from dropping).  Also use of compression stockings can be beneficial.  If your symptoms continue, I would encourage you to follow-up with your cardiologist in [facility] for further discussion  I will reach out to Dr. Arlean Hopping office to schedule follow-up imaging -I will request that they contact your wife, [name], to schedule  I still highly recommend that you undergo a sleep study due to continued fatigue - please let me know if you would like to pursue  Continue aspirin 325 mg daily  and Crestor  for secondary stroke prevention  Continue to follow up with PCP regarding cholesterol and blood pressure management  Maintain strict control of hypertension with blood pressure goal below 130/90 and cholesterol with LDL cholesterol (bad cholesterol) goal below 70 mg/dL.       Followup in the future with me in 6 months or call earlier if needed       Thank you for coming to see [place] at [facility]. I hope we have been able to provide you high quality care today.  You may receive a patient satisfaction survey over the next few weeks. We would appreciate your feedback and comments so that we may continue to improve ourselves and the health of our patients.  Orthostatic Hypotension Blood pressure is a measurement of how strongly, or weakly, your blood is pressing against the walls of your arteries. Orthostatic hypotension is a sudden drop in blood pressure that happens when you quickly change positions,such as when you get up from sitting or lying  down. Arteries are blood vessels that carry blood from your heart throughout your body. When blood pressure is too low, you may not get enough blood to your brain or to the rest of your organs. This can cause weakness, light-headedness, rapid heartbeat, and fainting. This can last for just a few seconds or for up to a few minutes. Orthostatic hypotension is usually not a serious problem. However, if it happens frequently or gets worse, it may be a sign of somethingmore serious. What are the causes? This condition may be caused by: Sudden changes in posture, such as standing up quickly after you have been sitting or lying down. Blood loss. Loss of body fluids (dehydration). Heart problems. Hormone (endocrine) problems. Pregnancy. Severe infection. Lack of certain nutrients. Severe allergic reactions (anaphylaxis). Certain medicines, such as blood pressure medicine or medicines that make the body lose excess fluids (diuretics). Sometimes, this condition can be caused by not taking medicine as directed, such as taking too much of a certain medicine. What increases the risk? The following factors may make you more likely to develop this condition: Age. Risk increases as you get older. Conditions that affect the heart or the central nervous system. Taking certain medicines, such as blood pressure medicine or diuretics. Being pregnant. What are the signs or symptoms? Symptoms of this condition may include: Weakness. Light-headedness. Dizziness. Blurred vision. Fatigue. Rapid heartbeat. Fainting, in severe cases. How is this diagnosed? This condition is diagnosed based on: Your medical history. Your symptoms. Your blood pressure measurement. Your health care provider will  check your blood pressure when you are: Lying down. Sitting. Standing. A blood pressure reading is recorded as two numbers, such as "120 over 80" (or 120/80). The first ("top") number is called the systolic pressure.  It is a measure of the pressure in your arteries as your heart beats. The second ("bottom") number is called the diastolic pressure. It is a measure of the pressure in your arteries when your heart relaxes between beats. Blood pressure is measured in a unit called mm Hg. Healthy blood pressure for most adults is 120/80. If your blood pressure is below 90/60, you may be diagnosed withhypotension. Other information or tests that may be used to diagnose orthostatic hypotension include: Your other vital signs, such as your heart rate and temperature. Blood tests. Tilt table test. For this test, you will be safely secured to a table that moves you from a lying position to an upright position. Your heart rhythm and blood pressure will be monitored during the test. How is this treated? This condition may be treated by: Changing your diet. This may involve eating more salt (sodium) or drinking more water. Taking medicines to raise your blood pressure. Changing the dosage of certain medicines you are taking that might be lowering your blood pressure. Wearing compression stockings. These stockings help to prevent blood clots and reduce swelling in your legs. In some cases, you may need to go to the hospital for: Fluid replacement. This means you will receive fluids through an IV. Blood replacement. This means you will receive donated blood through an IV (transfusion). Treating an infection or heart problems, if this applies. Monitoring. You may need to be monitored while medicines that you are taking wear off. Follow these instructions at home: Eating and drinking  Drink enough fluid to keep your urine pale yellow. Eat a healthy diet, and follow instructions from your health care provider about eating or drinking restrictions. A healthy diet includes: Fresh fruits and vegetables. Whole grains. Lean meats. Low-fat dairy products. Eat extra salt only as directed. Do not add extra salt to your diet  unless your health care provider told you to do that. Eat frequent, small meals. Avoid standing up suddenly after eating.  Medicines Take over-the-counter and prescription medicines only as told by your health care provider. Follow instructions from your health care provider about changing the dosage of your current medicines, if this applies. Do not stop or adjust any of your medicines on your own. General instructions  Wear compression stockings as told by your health care provider. Get up slowly from lying down or sitting positions. This gives your blood pressure a chance to adjust. Avoid hot showers and excessive heat as directed by your health care provider. Return to your normal activities as told by your health care provider. Ask your health care provider what activities are safe for you. Do not use any products that contain nicotine or tobacco, such as cigarettes, e-cigarettes, and chewing tobacco. If you need help quitting, ask your health care provider. Keep all follow-up visits as told by your health care provider. This is important.  Contact a health care provider if you: Vomit. Have diarrhea. Have a fever for more than 2-3 days. Feel more thirsty than usual. Feel weak and tired. Get help right away if you: Have chest pain. Have a fast or irregular heartbeat. Develop numbness in any part of your body. Cannot move your arms or your legs. Have trouble speaking. Become sweaty or feel light-headed. Faint. Feel short of  breath. Have trouble staying awake. Feel confused. Summary Orthostatic hypotension is a sudden drop in blood pressure that happens when you quickly change positions. Orthostatic hypotension is usually not a serious problem. It is diagnosed by having your blood pressure taken lying down, sitting, and then standing. It may be treated by changing your diet or adjusting your medicines. This information is not intended to replace advice given to you by your  health care provider. Make sure you discuss any questions you have with your healthcare provider. Document Revised: 02/16/2018 Document Reviewed: 02/16/2018 Elsevier Patient Education  [place].

## 2021-03-17 NOTE — Progress Notes (Signed)
I agree with the above plan 

## 2021-04-09 ENCOUNTER — Other Ambulatory Visit: Payer: Self-pay | Admitting: Family Medicine

## 2021-04-09 DIAGNOSIS — I6930 Unspecified sequelae of cerebral infarction: Secondary | ICD-10-CM

## 2021-04-09 DIAGNOSIS — E782 Mixed hyperlipidemia: Secondary | ICD-10-CM

## 2021-04-22 ENCOUNTER — Other Ambulatory Visit: Payer: Self-pay

## 2021-04-22 ENCOUNTER — Ambulatory Visit
Admission: EM | Admit: 2021-04-22 | Discharge: 2021-04-22 | Disposition: A | Discharge status: Home / Self Care | Payer: Medicare HMO | Attending: Sports Medicine | Admitting: Sports Medicine

## 2021-04-22 DIAGNOSIS — W57XXXA Bitten or stung by nonvenomous insect and other nonvenomous arthropods, initial encounter: Secondary | ICD-10-CM | POA: Diagnosis not present

## 2021-04-22 DIAGNOSIS — S80862A Insect bite (nonvenomous), left lower leg, initial encounter: Secondary | ICD-10-CM

## 2021-04-22 DIAGNOSIS — S80861A Insect bite (nonvenomous), right lower leg, initial encounter: Secondary | ICD-10-CM | POA: Diagnosis not present

## 2021-04-22 DIAGNOSIS — R21 Rash and other nonspecific skin eruption: Secondary | ICD-10-CM

## 2021-04-22 HISTORY — DX: Cerebral infarction, unspecified: I63.9

## 2021-04-22 MED ORDER — TRIAMCINOLONE ACETONIDE 0.1 % EX CREA: 1.0000 "application " | TOPICAL_CREAM | Freq: Two times a day (BID) | CUTANEOUS | 0 refills | Status: DC

## 2021-04-22 NOTE — ED Provider Notes (Signed)
[facility]    CSN: [id number] Arrival date & time: 04/22/21  [id number]      History   Chief Complaint Chief Complaint  Patient presents with   Rash    HPI Ethan Fitzgerald is a 65 y.o. male.   65 year old male who presents for evaluation of a rash for 4 days.  The rash is on the lower extremities around the ankles and up to the mid tibia.  He noticed it 4 days ago.  It was pruritic initially but that has resolved.  He is also noted a similar rash around the groin area.  He reports that he was helping a friend move the day before he noticed the rash.  He said he was moving a chair and his friend had a cat that lived in the chair.  He is concerned that maybe he was exposed to some insect or fleas.  He denies any new exposures.  No new detergents, deodorants, soaps, or colognes.  No new foods.  He has not noticed any tick bites.  It is possible that he may have been exposed to insects including mosquitoes.  He denies any fevers shakes or chills.  No nausea vomiting or diarrhea.  No chest pain or shortness of breath.  No red flag signs or symptoms elicited on history.   Past Medical History:  Diagnosis Date   COPD (chronic obstructive pulmonary disease) ([place])    mild   COVID-19 09/2019   Erectile dysfunction    Headache    daily, stress headaches   Knee pain, left    Seasonal allergies    Stroke [facility])    Tobacco dependence     Patient Active Problem List   Diagnosis Date Noted   PAD (peripheral artery disease) ([place]) 10/29/2020   History of cerebrovascular accident (CVA) with residual deficit 04/16/2020   ICAO (internal carotid artery occlusion), left 04/16/2020   Hemiplegia of dominant side following cerebrovascular accident (CVA) ([place]) 04/16/2020   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) 04/16/2020   Parotid mass 03/31/2020   Carotid stenosis 03/31/2020   Arterial hypotension 03/31/2020   Dysphagia due to recent stroke 03/31/2020    Hypokalemia 03/31/2020   Right shoulder pain 03/31/2020   SAH (subarachnoid hemorrhage) ([place]) secondary to IR 03/31/2020   Middle cerebral artery embolism, right 03/27/2020   History of COVID-19 12/04/2019   Centrilobular emphysema ([place]) 04/03/2019   Seasonal allergies 02/18/2017   Elevated hemoglobin A1c 02/18/2017   Erectile dysfunction 02/18/2017   Hyperlipidemia 02/18/2017   History of hemorrhoids 02/18/2017   Former smoker 02/18/2017   Chronic low back pain 02/18/2017   Chronic pain of left knee 02/18/2017    Past Surgical History:  Procedure Laterality Date   BACK SURGERY     metal plate in back   COLONOSCOPY     COLONOSCOPY WITH PROPOFOL N/A 04/26/2019   Procedure: COLONOSCOPY WITH PROPOFOL;  Surgeon: Jonathon Bellows, MD;  Location: [facility];  Service: Gastroenterology;  Laterality: N/A;   HERNIA REPAIR Right    IR ANGIO INTRA EXTRACRAN SEL COM CAROTID INNOMINATE UNI L MOD SED  03/27/2020   IR ANGIO VERTEBRAL SEL SUBCLAVIAN INNOMINATE UNI L MOD SED  03/27/2020   IR ANGIO VERTEBRAL SEL VERTEBRAL UNI R MOD SED  03/27/2020   IR CT HEAD LTD  03/27/2020   IR PERCUTANEOUS ART THROMBECTOMY/INFUSION INTRACRANIAL INC DIAG ANGIO  03/27/2020   KNEE ARTHROSCOPY Left 03/28/2015   Procedure: Arthroscopic partial medial meniscectomy plus chondral debridement;  Surgeon: Leanor Kail, MD;  Location: [place];  Service: Orthopedics;  Laterality: Left;   RADIOLOGY WITH ANESTHESIA N/A 03/27/2020   Procedure: IR WITH ANESTHESIA;  Surgeon: Luanne Bras, MD;  Location: [place];  Service: Radiology;  Laterality: N/A;       Home Medications    Prior to Admission medications   Medication Sig Start Date End Date Taking? Authorizing Provider  albuterol (VENTOLIN HFA) 108 (90 Base) MCG/ACT inhaler Inhale 1-2 puffs into the lungs every 4 (four) hours as needed for wheezing or shortness of breath. 11/05/19  Yes Karamalegos, Devonne Doughty, DO  aspirin EC 325 MG EC tablet Take 1 tablet (325  mg total) by mouth daily. 04/09/20  Yes Love, Ivan Anchors, PA-C  cholecalciferol (VITAMIN D3) 25 MCG (1000 UNIT) tablet Take 1,000 Units by mouth daily.   Yes [provider]  fludrocortisone (FLORINEF) 0.1 MG tablet TAKE 1 TABLET (0.1 MG TOTAL) BY MOUTH DAILY. Patient taking differently: Take 0.1 mg by mouth as needed. 08/22/20  Yes Minna Merritts, MD  rosuvastatin (CRESTOR) 40 MG tablet TAKE 1 TABLET AT BEDTIME 04/09/21  Yes Karamalegos, Alexander J, DO  sildenafil (REVATIO) 20 MG tablet TAKE 1 TO 5 TABLETS BY MOUTH ABOUT 30 MINUTES PRIOR TO SEX.START WITH 1 THEN INCREASE 12/29/20  Yes Karamalegos, Devonne Doughty, DO  Tiotropium Bromide-Olodaterol (STIOLTO RESPIMAT) 2.5-2.5 MCG/ACT AERS Inhale 2 puffs into the lungs daily. 05/06/20  Yes Lauraine Rinne, NP  triamcinolone cream (KENALOG) 0.1 % Apply 1 application topically 2 (two) times daily. 04/22/21  Yes Verda Cumins, MD  vitamin C (ASCORBIC ACID) 500 MG tablet Take 500 mg by mouth daily. Am   Yes [provider]  vitamin B-12 (CYANOCOBALAMIN) 1000 MCG tablet Take 1 tablet (1,000 mcg total) by mouth daily. 10/29/20   Olin Hauser, DO    Family History Family History  Problem Relation Age of Onset   Diabetes Mother    Heart attack Mother 4   Cancer Father        liver    COPD Brother    HIV/AIDS Brother    Prostate cancer Neg Hx    Colon cancer Neg Hx     Social History Social History   Tobacco Use   Smoking status: Former    Packs/day: 1.50    Years: 49.00    Pack years: 73.50    Types: Cigarettes    Start date: 1970    Quit date: 09/14/2017    Years since quitting: 3.6   Smokeless tobacco: Former  Scientific laboratory technician Use: Never used  Substance Use Topics   Alcohol use: Yes    Comment: 6 pack on weekend   Drug use: No     Allergies   Patient has no known allergies.   Review of Systems Review of Systems  Constitutional:  Negative for activity change, appetite change, chills, diaphoresis,  fatigue and fever.  HENT:  Negative for congestion, ear pain, postnasal drip, rhinorrhea, sinus pressure, sinus pain, sneezing and sore throat.   Eyes:  Negative for pain.  Respiratory:  Negative for cough, chest tightness and shortness of breath.   Cardiovascular:  Negative for chest pain and palpitations.  Gastrointestinal:  Negative for abdominal pain, diarrhea, nausea and vomiting.  Genitourinary:  Negative for dysuria.  Musculoskeletal:  Negative for back pain, myalgias and neck pain.  Skin:  Positive for color change and rash. Negative for pallor and wound.  Allergic/Immunologic: Negative for environmental allergies and food  allergies.  Neurological:  Negative for dizziness, seizures, syncope, light-headedness and headaches.  All other systems reviewed and are negative.   Physical Exam Triage Vital Signs ED Triage Vitals  Enc Vitals Group     BP 04/22/21 0825 (!) 132/96     Pulse Rate 04/22/21 0825 82     Resp 04/22/21 0825 18     Temp 04/22/21 0825 97.6 F (36.4 C)     Temp Source 04/22/21 0825 Oral     SpO2 04/22/21 0825 98 %     Weight 04/22/21 0822 173 lb (78.5 kg)     Height 04/22/21 0822 6' (1.829 m)     Head Circumference --      Peak Flow --      Pain Score 04/22/21 0822 0     Pain Loc --      Pain Edu? --      Excl. in [place]? --    No data found.  Updated Vital Signs BP (!) 132/96 (BP Location: Left Arm)   Pulse 82   Temp 97.6 F (36.4 C) (Oral)   Resp 18   Ht 6' (1.829 m)   Wt 78.5 kg   SpO2 98%   BMI 23.46 kg/m   Visual Acuity Right Eye Distance:   Left Eye Distance:   Bilateral Distance:    Right Eye Near:   Left Eye Near:    Bilateral Near:     Physical Exam Vitals and nursing note reviewed.  Constitutional:      General: He is not in acute distress.    Appearance: Normal appearance. He is not ill-appearing, toxic-appearing or diaphoretic.  HENT:     Head: Normocephalic and atraumatic.     Nose: Nose normal.     Mouth/Throat:      Mouth: Mucous membranes are moist.  Eyes:     General: No scleral icterus.    Conjunctiva/sclera: Conjunctivae normal.     Pupils: Pupils are equal, round, and reactive to light.  Cardiovascular:     Rate and Rhythm: Normal rate and regular rhythm.     Pulses: Normal pulses.     Heart sounds: Normal heart sounds. No murmur heard.   No friction rub. No gallop.  Pulmonary:     Effort: Pulmonary effort is normal.     Breath sounds: Normal breath sounds. No stridor. No wheezing, rhonchi or rales.  Musculoskeletal:     Cervical back: Normal range of motion and neck supple.  Skin:    General: Skin is warm and dry.     Capillary Refill: Capillary refill takes less than 2 seconds.     Findings: Erythema and rash present. No abscess, signs of injury or wound. Rash is macular and papular.     Comments: Multiple red mildly raised lesions that are macular and papular.  No evidence of any infection, abscess.  There is no drainage.  The lesions do not blanch.  Neurological:     General: No focal deficit present.     Mental Status: He is alert and oriented to person, place, and time.     UC Treatments / Results  Labs (all labs ordered are listed, but only abnormal results are displayed) Labs Reviewed - No data to display  EKG   Radiology No results found.  Procedures Procedures (including critical care time)  Medications Ordered in UC Medications - No data to display  Initial Impression / Assessment and Plan / UC Course  I have reviewed the triage  vital signs and the nursing notes.  Pertinent labs & imaging results that were available during my care of the patient were reviewed by me and considered in my medical decision making (see chart for details).  Clinical impression: Rash for 4 days bilateral lower extremities and a little bit in the groin.  Seems consistent with insect bites.  Treatment plan: 1.  The findings and treatment plan were discussed in detail with the patient.   Patient was in agreement. 2.  We will prescribe triamcinolone steroid cream to be used on the area as directed. 3.  Educational handouts provided. 4.  We did have a long discussion about the possibilities of the rash.  It appears as though what ever he was exposed to is no longer present after a detailed skin check.  I have asked him just to monitor symptoms and make sure that they are improving and that he is not getting any new lesions.  He voiced verbal understanding and was in agreement with the treatment plan. 5.  If he does get any pruritus he can use over-the-counter Benadryl cream, or oral Benadryl at nighttime given the possibility of sedation. 6.  I went over the red flag signs and symptoms and when to seek out immediate medical attention either in the ER, with his PCP, or back here in the urgent care.  He voiced verbal understanding. 7.  Patient was stable upon discharge and he will follow-up here as needed.    Final Clinical Impressions(s) / UC Diagnoses   Final diagnoses:  Rash and nonspecific skin eruption  Insect bite of right lower leg, initial encounter  Insect bite of left lower leg, initial encounter     Discharge Instructions      As we discussed, I believe your rash is from an insect.  It could also be contact dermatitis.  On the positive side, it is not itchy.  If it becomes itchy please purchase some over-the-counter Benadryl cream.  You can also take Benadryl at night which will help the itch.  You only wanted take it at night because it can make you sleepy. Please see the educational handouts. Please contact your primary care provider if things do not improve. If you develop fever, sweats, significant fatigue, please contact your primary care provider or come back here. I did send in a steroid cream please use that as directed.     ED Prescriptions     Medication Sig Dispense Auth. Provider   triamcinolone cream (KENALOG) 0.1 % Apply 1 application topically 2  (two) times daily. 30 g Verda Cumins, MD      PDMP not reviewed this encounter.   Verda Cumins, MD 04/22/21 [phone]

## 2021-04-22 NOTE — ED Triage Notes (Signed)
Pt c/o rash to both ankles, his lower abdomen and his groin. Pt reports there has been some itching but it is not significant. Pt has used Calamine lotion with improvement. Pt states he has not spent a lot of time outdoors, is unsure of source for rash. Pt denies any pain or burning to the areas.

## 2021-04-22 NOTE — Discharge Instructions (Addendum)
As we discussed, I believe your rash is from an insect.  It could also be contact dermatitis.  On the positive side, it is not itchy.  If it becomes itchy please purchase some over-the-counter Benadryl cream.  You can also take Benadryl at night which will help the itch.  You only wanted take it at night because it can make you sleepy. Please see the educational handouts. Please contact your primary care provider if things do not improve. If you develop fever, sweats, significant fatigue, please contact your primary care provider or come back here. I did send in a steroid cream please use that as directed.

## 2021-05-04 ENCOUNTER — Telehealth: Payer: Self-pay | Admitting: Adult Health

## 2021-05-04 NOTE — Telephone Encounter (Signed)
Would you be able to reach out to wife, [name], to schedule f/u imaging with Dr. Estanislado Pandy? Thank you!

## 2021-05-04 NOTE — Telephone Encounter (Signed)
FYI from [place], please inform patient's wife   Ethan Fitzgerald,  So, the patient will be due for repeat CTA head/neck in October. He is on our follow-up list for October and we will contact him to schedule when it is time.  Thanks,  [employer]

## 2021-05-04 NOTE — Telephone Encounter (Signed)
Called wife and advised her of Dr Arlean Hopping office note. She stated they need to call her to schedule; she will call them to give them her #,  verbalized understanding, appreciation.

## 2021-05-04 NOTE — Telephone Encounter (Signed)
Pt's wife, Charis Cuozzo ( on [place]) called, have not received a call from anyone about scheduling his appt for imaging. Would like a call from the nurse.

## 2021-05-04 NOTE — Telephone Encounter (Signed)
I reached out to [place] to check the status.

## 2021-05-04 NOTE — Telephone Encounter (Signed)
There is no order in there for any kind of imaging.

## 2021-05-04 NOTE — Telephone Encounter (Signed)
Called wife and she stated he told her he was supposed to have some scans. She is asking what scans, how to schedule. Per NP's note Dr Arlean Hopping office was to call wife. No orders seen, will get clarification from NP and call wife back. Wife  verbalized understanding, appreciation.

## 2021-05-07 ENCOUNTER — Ambulatory Visit: Payer: Medicare HMO | Admitting: Family Medicine

## 2021-06-01 ENCOUNTER — Other Ambulatory Visit: Payer: Self-pay | Admitting: Pulmonary Disease

## 2021-06-01 NOTE — Telephone Encounter (Signed)
Patient has already been advised that this will not be scheduled until October. He will not be due for repeat scan until then.

## 2021-06-01 NOTE — Telephone Encounter (Signed)
Pt's wife, Nikolaj Geraghty (on [place]) he is starting to forget things. Check when his CTA will be scheduled. Would like a call from the back.

## 2021-06-02 ENCOUNTER — Telehealth: Payer: Self-pay | Admitting: Cardiovascular Disease

## 2021-06-02 NOTE — Telephone Encounter (Signed)
-----   Message from Horton Finer sent at 06/02/2021 12:24 PM EDT ----- Regarding: needs appointment Please schedule F/U appointment for refills. Thank you!

## 2021-06-02 NOTE — Telephone Encounter (Signed)
LVM to schedule appt

## 2021-06-11 NOTE — Telephone Encounter (Signed)
Pt's wife [name] called once again regarding the scan. I informed her that she has already been advised that this will be scheduled in Oct when it is due. I informed her that someone will be calling her this month to schedule.

## 2021-07-06 ENCOUNTER — Other Ambulatory Visit: Payer: Self-pay | Admitting: Family Medicine

## 2021-07-06 DIAGNOSIS — J432 Centrilobular emphysema: Secondary | ICD-10-CM

## 2021-07-06 MED ORDER — ALBUTEROL SULFATE HFA 108 (90 BASE) MCG/ACT IN AERS: 1.0000 | INHALATION_SPRAY | RESPIRATORY_TRACT | 0 refills | Status: DC | PRN

## 2021-07-06 NOTE — Telephone Encounter (Signed)
Requested Prescriptions  Pending Prescriptions Disp Refills  . albuterol (VENTOLIN HFA) 108 (90 Base) MCG/ACT inhaler 18 g 0    Sig: Inhale 1-2 puffs into the lungs every 4 (four) hours as needed for wheezing or shortness of breath.     Pulmonology:  Beta Agonists Failed - 07/06/2021  5:02 PM      Failed - One inhaler should last at least one month. If the patient is requesting refills earlier, contact the patient to check for uncontrolled symptoms.      Passed - Valid encounter within last 12 months    Recent Outpatient Visits          6 months ago Annual physical exam   [place], DO   8 months ago B12 deficiency   [facility] [place], Devonne Doughty, DO   1 year ago History of cerebrovascular accident (CVA) with residual deficit   [place], DO   1 year ago Acute bronchitis with COPD [facility])   [place], DO   1 year ago Elevated hemoglobin A1c   [facility] [place], Devonne Doughty, DO      Future Appointments            In 3 months [facility], [facility]

## 2021-07-06 NOTE — Telephone Encounter (Signed)
Medication Refill - Medication: albuterol (VENTOLIN HFA) 108 (90 Base) MCG/ACT inhaler  Has the patient contacted their pharmacy? Yes.   (Agent: If no, request that the patient contact the pharmacy for the refill. If patient does not wish to contact the pharmacy document the reason why and proceed with request.) (Agent: If yes, when and what did the pharmacy advise?)Pt called pharmacy and stated pharmacy advised him to call the office   Preferred Pharmacy (with phone number or street name):  [place], [place] Phone:  [phone]  Fax:  [phone]     Has the patient been seen for an appointment in the last year OR does the patient have an upcoming appointment? Yes.    Agent: Please be advised that RX refills may take up to 3 business days. We ask that you follow-up with your pharmacy.

## 2021-07-10 ENCOUNTER — Other Ambulatory Visit: Payer: Self-pay | Admitting: Family Medicine

## 2021-07-10 ENCOUNTER — Encounter: Payer: Self-pay | Admitting: Family Medicine

## 2021-07-10 ENCOUNTER — Ambulatory Visit (INDEPENDENT_AMBULATORY_CARE_PROVIDER_SITE_OTHER): Payer: Medicare HMO | Admitting: Family Medicine

## 2021-07-10 ENCOUNTER — Other Ambulatory Visit: Payer: Self-pay

## 2021-07-10 VITALS — BP 113/85 | HR 73 | Ht 72.0 in | Wt 170.6 lb

## 2021-07-10 DIAGNOSIS — S46811A Strain of other muscles, fascia and tendons at shoulder and upper arm level, right arm, initial encounter: Secondary | ICD-10-CM

## 2021-07-10 DIAGNOSIS — J432 Centrilobular emphysema: Secondary | ICD-10-CM | POA: Diagnosis not present

## 2021-07-10 DIAGNOSIS — M25511 Pain in right shoulder: Secondary | ICD-10-CM | POA: Diagnosis not present

## 2021-07-10 MED ORDER — STIOLTO RESPIMAT 2.5-2.5 MCG/ACT IN AERS: 2.0000 | INHALATION_SPRAY | Freq: Every day | RESPIRATORY_TRACT | 3 refills | Status: DC

## 2021-07-10 NOTE — Patient Instructions (Addendum)
Thank you for coming to the office today.  START anti inflammatory topical - OTC Voltaren (generic Diclofenac) topical 2-4 times a day as needed for pain swelling of affected joint for 1-2 weeks or longer.  Likely have a trigger point / pain spasm of the RIGHT Levator Scapulae muscle. This is a very common tension point.    Please schedule a Follow-up Appointment to: Return if symptoms worsen or fail to improve, for R levator scap trigger point inj if not improved.  If you have any other questions or concerns, please feel free to call the office or send a message through [place]. You may also schedule an earlier appointment if necessary.  Additionally, you may be receiving a survey about your experience at our office within a few days to 1 week by e-mail or mail. We value your feedback.  [name], DO [facility], [facility]  Trigger Point Injection Trigger points are areas where you have pain. A trigger point injection is a shot given in the trigger point to help relieve pain for a few days to a few months. Common places for trigger points include the neck, shoulders, upper back, or lower back. A trigger point injection will not cure long-term (chronic) pain permanently. These injections do not always work for every person. For some people, they can help to relieve pain for a few days to a few months. Tell a health care provider about: Any allergies you have. All medicines you are taking, including vitamins, herbs, eye drops, creams, and over-the-counter medicines. Any problems you or family members have had with anesthetic medicines. Any bleeding problems you have. Any surgeries you have had. Any medical conditions you have. Whether you are pregnant or may be pregnant. What are the risks? Generally, this is a safe procedure. However, problems may occur, including: Infection. Bleeding or bruising. Allergic reaction to the injected medicine. Irritation of the  skin around the injection site. What happens before the procedure? Ask your health care provider about: Changing or stopping your regular medicines. This is especially important if you are taking diabetes medicines or blood thinners. Taking medicines such as aspirin and ibuprofen. These medicines can thin your blood. Do not take these medicines unless your health care provider tells you to take them. Taking over-the-counter medicines, vitamins, herbs, and supplements. What happens during the procedure?  Your health care provider will feel for trigger points. A marker may be used to circle the area for the injection. The skin over the trigger point will be washed with a germ-killing soap. You may be given a medicine to help you relax (sedative). A thin needle is used for the injection. You may feel pain or a twitching feeling when the needle enters your skin. A numbing solution may be injected into the trigger point. Sometimes a medicine to keep down inflammation is also injected. Your health care provider may move the needle around the area where the trigger point is located until the tightness and twitching goes away. After the injection, your health care provider may put gentle pressure over the injection site. The injection site will be covered with a bandage (dressing). The procedure may vary among health care providers and hospitals. What can I expect after treatment? After treatment, you may have soreness and stiffness for 1-2 days. Follow these instructions at home: Injection site care Remove your dressing in a few hours, or as told by your health care provider. Check your injection site every day for signs of infection.  Check for: Redness, swelling, or pain. Fluid or blood. Warmth. Pus or a bad smell. Managing pain, stiffness, and swelling If directed, put ice on the affected area. To do this: Put ice in a plastic bag. Place a towel between your skin and the bag. Leave the ice  on for 20 minutes, 2-3 times a day. Remove the ice if your skin turns bright red. This is very important. If you cannot feel pain, heat, or cold, you have a greater risk of damage to the area. Activity If you were given a sedative during the procedure, it can affect you for several hours. Do not drive or operate machinery until your health care provider says that it is safe. Do not take baths, swim, or use a hot tub until your health care provider approves. Return to your normal activities as told by your health care provider. Ask your health care provider what activities are safe for you. General instructions If you were asked to stop your regular medicines, ask your health care provider when you may start taking them again. You may be asked to see an occupational or physical therapist for exercises that reduce muscle strain and stretch the area of the trigger point. Keep all follow-up visits. This is important. Contact a health care provider if: Your pain comes back, and it is worse than before the injection. You may need more injections. You have chills or a fever. The injection site becomes more painful, red, swollen, or warm to the touch. Summary A trigger point injection is a shot given in the trigger point to help relieve pain. Common places for trigger point injections are the neck, shoulders, upper back, and lower back. These injections do not always work for every person, but for some people, the injections can help to relieve pain for a few days to a few months. Contact a health care provider if symptoms come back or if they are worse than before treatment. Also, get help if the injection site becomes more painful, red, swollen, or warm to the touch. This information is not intended to replace advice given to you by your health care provider. Make sure you discuss any questions you have with your health care provider. Document Revised: 12/02/2020 Document Reviewed: 12/02/2020 Elsevier  Patient Education  [place].   Trigger Point Dry Needling  What is Trigger Point Dry Needling (DN)?   1. DN is a physical therapy technique used to treat muscle pain and     Dysfunction.  Specifically, DN helps deactivate muscle trigger    points (Muscle Knots).   2. A thin filiform needle is used to penetrate the skin and stimulate    the underlying trigger point.  The goal is for a local twitch     response (LTR) to occur and for the trigger point to relax.  No    medication of any kind is injected during the procedure.  What Does Trigger Point Dry Needling Feel Like?   1. The procedures feels different for each individual patient.   Some    patients report that they do not actually feel the      needle enter the skin and overall the process is not  painful.  Very    mild bleeding may occur.  However, many patients feel a deep    cramping in the muscle in which the needle was inserted. This is t   he local twitch response.   How Will I Feel After The Treatment?  1. Soreness is normal, and the onset of soreness may not occur for    a few hours.  Typically this soreness does not last longer than two    days.   2. Bruising is uncommon, however; ice can be used to decrease any    possible bruising.   3. In rare cases feeling tired or nauseous after the treatment is    normal.  In addition, your symptoms may get worse before they    get better, this period will typically not last longer than 24 hours.  What Can I do After My Treatment?   1.  Increase your hydration by drinking more water for the next 24    hours.   2.  You may place ice or heat on the areas treated that have become    sore, however don not use heat on inflamed or bruised areas.     Heat often brings more relief post needling.   3. You can continue your regular activities, but vigorous activity is    not recommended initially after the treatment for 24 hours.   4. DN is best combined with other physical therapy such as      strengthening, stretching, and other therapies.

## 2021-07-10 NOTE — Progress Notes (Signed)
Subjective:    Patient ID: CALEY VOLKERT, male    DOB: 1956-08-03, 65 y.o.   MRN: [id number]  WENDAL WILKIE is a 65 y.o. male presenting on 07/10/2021 for Neck Pain   HPI  Right Levator Scapular Trigger Point / SPasm Acute muscle strain spasm flare R side at upper back/shoulder area, radiating into his neck, worse with certain movements and positions, attributed to sleeping has a knot at the muscle. He has tried heat / cold but no other treatments. Not used topical. Has not had PT or other therapy. Denies any numbness weakness tingling other pain or headache   Depression screen [facility] 2/9 07/10/2021 12/29/2020 10/07/2020  Decreased Interest 0 0 0  Down, Depressed, Hopeless 0 0 0  PHQ - 2 Score 0 0 0  Altered sleeping 0 0 -  Tired, decreased energy 0 1 -  Change in appetite 0 0 -  Feeling bad or failure about yourself  0 0 -  Trouble concentrating 0 0 -  Moving slowly or fidgety/restless 0 0 -  Suicidal thoughts 0 0 -  PHQ-9 Score 0 1 -  Difficult doing work/chores Not difficult at all - -    Social History   Tobacco Use   Smoking status: Former    Packs/day: 1.50    Years: 49.00    Pack years: 73.50    Types: Cigarettes    Start date: 1970    Quit date: 09/14/2017    Years since quitting: 3.8   Smokeless tobacco: Former  Scientific laboratory technician Use: Never used  Substance Use Topics   Alcohol use: Yes    Comment: 6 pack on weekend   Drug use: No    Review of Systems Per HPI unless specifically indicated above     Objective:    BP 113/85   Pulse 73   Ht 6' (1.829 m)   Wt 170 lb 9.6 oz (77.4 kg)   SpO2 96%   BMI 23.14 kg/m   Wt Readings from Last 3 Encounters:  07/10/21 170 lb 9.6 oz (77.4 kg)  04/22/21 173 lb (78.5 kg)  03/12/21 174 lb (78.9 kg)    Physical Exam Vitals and nursing note reviewed.  Constitutional:      General: He is not in acute distress.    Appearance: Normal appearance. He is well-developed. He is not diaphoretic.     Comments:  Well-appearing, comfortable, cooperative  HENT:     Head: Normocephalic and atraumatic.  Eyes:     General:        Right eye: No discharge.        Left eye: No discharge.     Conjunctiva/sclera: Conjunctivae normal.  Cardiovascular:     Rate and Rhythm: Normal rate.  Pulmonary:     Effort: Pulmonary effort is normal.  Musculoskeletal:     Comments: Right levator scap muscle with spasm and hypertonicity trigger point on exam, reproduced symptoms localized. Has ROM shoulder and neck.  Skin:    General: Skin is warm and dry.     Findings: No erythema or rash.  Neurological:     Mental Status: He is alert and oriented to person, place, and time.  Psychiatric:        Mood and Affect: Mood normal.        Behavior: Behavior normal.        Thought Content: Thought content normal.     Comments: Well groomed, good eye contact, normal speech and  thoughts   Results for orders placed or performed in visit on 12/22/20  VITAMIN D 25 Hydroxy (Vit-D Deficiency, Fractures)  Result Value Ref Range   Vit D, 25-Hydroxy 39 30 - 100 ng/mL  Vitamin B12  Result Value Ref Range   Vitamin B-12 363 200 - 1,100 pg/mL  PSA  Result Value Ref Range   PSA 0.58 < OR = 4.0 ng/mL  Lipid panel  Result Value Ref Range   Cholesterol 121 <200 mg/dL   HDL 36 (L) > OR = 40 mg/dL   Triglycerides 218 (H) <150 mg/dL   LDL Cholesterol (Calc) 56 mg/dL (calc)   Total CHOL/HDL Ratio 3.4 <5.0 (calc)   Non-HDL Cholesterol (Calc) 85 <130 mg/dL (calc)  COMPLETE METABOLIC PANEL WITH GFR  Result Value Ref Range   Glucose, Bld 108 (H) 65 - 99 mg/dL   BUN 18 7 - 25 mg/dL   Creat 1.05 0.70 - 1.25 mg/dL   GFR, Est Non African American 74 > OR = 60 mL/min/1.37m2   GFR, Est African American 85 > OR = 60 mL/min/1.76m2   BUN/Creatinine Ratio NOT APPLICABLE 6 - 22 (calc)   Sodium 141 135 - 146 mmol/L   Potassium 3.8 3.5 - 5.3 mmol/L   Chloride 108 98 - 110 mmol/L   CO2 25 20 - 32 mmol/L   Calcium 9.6 8.6 - 10.3 mg/dL    Total Protein 6.4 6.1 - 8.1 g/dL   Albumin 4.1 3.6 - 5.1 g/dL   Globulin 2.3 1.9 - 3.7 g/dL (calc)   AG Ratio 1.8 1.0 - 2.5 (calc)   Total Bilirubin 1.2 0.2 - 1.2 mg/dL   Alkaline phosphatase (APISO) 60 35 - 144 U/L   AST 20 10 - 35 U/L   ALT 16 9 - 46 U/L  CBC with Differential/Platelet  Result Value Ref Range   WBC 8.8 3.8 - 10.8 Thousand/uL   RBC 5.05 4.20 - 5.80 Million/uL   Hemoglobin 15.9 13.2 - 17.1 g/dL   HCT 48.2 38.5 - 50.0 %   MCV 95.4 80.0 - 100.0 fL   MCH 31.5 27.0 - 33.0 pg   MCHC 33.0 32.0 - 36.0 g/dL   RDW 13.1 11.0 - 15.0 %   Platelets 213 140 - 400 Thousand/uL   MPV 13.1 (H) 7.5 - 12.5 fL   Neutro Abs 4,981 1,500 - 7,800 cells/uL   Lymphs Abs 2,763 850 - 3,900 cells/uL   Absolute Monocytes 563 200 - 950 cells/uL   Eosinophils Absolute 422 15 - 500 cells/uL   Basophils Absolute 70 0 - 200 cells/uL   Neutrophils Relative % 56.6 %   Total Lymphocyte 31.4 %   Monocytes Relative 6.4 %   Eosinophils Relative 4.8 %   Basophils Relative 0.8 %  Hemoglobin A1c  Result Value Ref Range   Hgb A1c MFr Bld 5.6 <5.7 % of total Hgb   Mean Plasma Glucose 114 mg/dL   eAG (mmol/L) 6.3 mmol/L      Assessment & Plan:   Problem List Items Addressed This Visit     Centrilobular emphysema ([place]) - Primary   Other Visit Diagnoses     Strain of right levator scapulae muscle, initial encounter       Trigger point of right shoulder region           Consistent with Right levator scapulae / scapular muscle spasm and trigger point, occasional radiating pain into neck no other focal deficit or concern.  Plan: 1. START anti inflammatory  topical - OTC Voltaren (generic Diclofenac) topical 2-4 times a day as needed for pain swelling of affected joint for 1-2 weeks or longer. 2. Stop Methocarbamol / NSAID since not working 3. Heat topical routine, and may use icing PRN 4. Utilize local acupuncture massage/therapy service he has access to  If need I can refer to PT for dry  needling and other therapy.  Follow-up within few weeks, may consider trigger point injection in future   No orders of the defined types were placed in this encounter.     Follow up plan: Return if symptoms worsen or fail to improve, for R levator scap trigger point inj if not improved.   [name], [facility] 07/10/2021, 11:26 AM

## 2021-07-20 ENCOUNTER — Other Ambulatory Visit: Payer: Self-pay | Admitting: Family Medicine

## 2021-07-20 DIAGNOSIS — J432 Centrilobular emphysema: Secondary | ICD-10-CM

## 2021-07-21 NOTE — Telephone Encounter (Signed)
Requested Prescriptions  Pending Prescriptions Disp Refills  . albuterol (VENTOLIN HFA) 108 (90 Base) MCG/ACT inhaler [Pharmacy Med Name: ALBUTEROL SULFATE HFA 108 (90 Base) MCG/ACT Aerosol Solution] 1 each 2    Sig: INHALE 1 TO 2 PUFFS EVERY 4 HOURS AS NEEDED FOR WHEEZING OR SHORTNESS OF BREATH     Pulmonology:  Beta Agonists Failed - 07/20/2021 12:09 PM      Failed - One inhaler should last at least one month. If the patient is requesting refills earlier, contact the patient to check for uncontrolled symptoms.      Passed - Valid encounter within last 12 months    Recent Outpatient Visits          1 week ago Centrilobular emphysema ([place])   [place], DO   6 months ago Annual physical exam   [place], DO   8 months ago B12 deficiency   [facility], Devonne Doughty, DO   1 year ago History of cerebrovascular accident (CVA) with residual deficit   [place], DO   1 year ago Acute bronchitis with COPD [facility])   [facility] Olin Hauser, DO      Future Appointments            In 2 months [facility], [facility]

## 2021-07-26 ENCOUNTER — Ambulatory Visit
Admission: EM | Admit: 2021-07-26 | Discharge: 2021-07-26 | Disposition: A | Discharge status: Home / Self Care | Payer: Medicare HMO | Attending: Emergency Medicine | Admitting: Emergency Medicine

## 2021-07-26 ENCOUNTER — Other Ambulatory Visit: Payer: Self-pay

## 2021-07-26 DIAGNOSIS — S161XXA Strain of muscle, fascia and tendon at neck level, initial encounter: Secondary | ICD-10-CM | POA: Diagnosis not present

## 2021-07-26 MED ORDER — BACLOFEN 10 MG PO TABS: 10.0000 mg | ORAL_TABLET | Freq: Three times a day (TID) | ORAL | 0 refills | Status: DC

## 2021-07-26 NOTE — ED Provider Notes (Signed)
[facility]    CSN: [id number] Arrival date & time: 07/26/21  1139      History   Chief Complaint Chief Complaint  Patient presents with   Neck Pain    HPI Ethan Fitzgerald is a 65 y.o. male.   HPI  65 year old male here for evaluation of neck pain.  Patient reports that he has been experiencing pain on the right side of his neck for the past month.  This pain does not cause any numbness or tingling in his fingers but it is affecting his ability to function.  He saw his PCP on 07/10/2021 and was instructed to start topical Voltaren gel, use heat and ice as needed, use acupuncture massage therapy, and to contact his PCP back for referral to physical therapy if his symptoms were not working.  There is also consideration for possible trigger point injection in the future.  Patient states he has done none of that except use the Voltaren gel.  He denies using the dosing card for proper dosing of his medication.  Past Medical History:  Diagnosis Date   COPD (chronic obstructive pulmonary disease) ([place])    mild   COVID-19 09/2019   Erectile dysfunction    Headache    daily, stress headaches   Knee pain, left    Seasonal allergies    Stroke [facility])    Tobacco dependence     Patient Active Problem List   Diagnosis Date Noted   PAD (peripheral artery disease) ([place]) 10/29/2020   History of cerebrovascular accident (CVA) with residual deficit 04/16/2020   ICAO (internal carotid artery occlusion), left 04/16/2020   Hemiplegia of dominant side following cerebrovascular accident (CVA) ([place]) 04/16/2020   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) 04/16/2020   Parotid mass 03/31/2020   Carotid stenosis 03/31/2020   Arterial hypotension 03/31/2020   Dysphagia due to recent stroke 03/31/2020   Hypokalemia 03/31/2020   Right shoulder pain 03/31/2020   SAH (subarachnoid hemorrhage) ([place]) secondary to IR 03/31/2020   Middle cerebral artery embolism,  right 03/27/2020   History of COVID-19 12/04/2019   Centrilobular emphysema ([place]) 04/03/2019   Seasonal allergies 02/18/2017   Elevated hemoglobin A1c 02/18/2017   Erectile dysfunction 02/18/2017   Hyperlipidemia 02/18/2017   History of hemorrhoids 02/18/2017   Former smoker 02/18/2017   Chronic low back pain 02/18/2017   Chronic pain of left knee 02/18/2017    Past Surgical History:  Procedure Laterality Date   BACK SURGERY     metal plate in back   COLONOSCOPY     COLONOSCOPY WITH PROPOFOL N/A 04/26/2019   Procedure: COLONOSCOPY WITH PROPOFOL;  Surgeon: Jonathon Bellows, MD;  Location: [facility] ENDOSCOPY;  Service: Gastroenterology;  Laterality: N/A;   HERNIA REPAIR Right    IR ANGIO INTRA EXTRACRAN SEL COM CAROTID INNOMINATE UNI L MOD SED  03/27/2020   IR ANGIO VERTEBRAL SEL SUBCLAVIAN INNOMINATE UNI L MOD SED  03/27/2020   IR ANGIO VERTEBRAL SEL VERTEBRAL UNI R MOD SED  03/27/2020   IR CT HEAD LTD  03/27/2020   IR PERCUTANEOUS ART THROMBECTOMY/INFUSION INTRACRANIAL INC DIAG ANGIO  03/27/2020   KNEE ARTHROSCOPY Left 03/28/2015   Procedure: Arthroscopic partial medial meniscectomy plus chondral debridement;  Surgeon: Leanor Kail, MD;  Location: [place];  Service: Orthopedics;  Laterality: Left;   RADIOLOGY WITH ANESTHESIA N/A 03/27/2020   Procedure: IR WITH ANESTHESIA;  Surgeon: Luanne Bras, MD;  Location: [place];  Service: Radiology;  Laterality: N/A;  Home Medications    Prior to Admission medications   Medication Sig Start Date End Date Taking? Authorizing Provider  albuterol (VENTOLIN HFA) 108 (90 Base) MCG/ACT inhaler INHALE 1 TO 2 PUFFS EVERY 4 HOURS AS NEEDED FOR WHEEZING OR SHORTNESS OF BREATH 07/21/21  Yes Karamalegos, Devonne Doughty, DO  aspirin EC 325 MG EC tablet Take 1 tablet (325 mg total) by mouth daily. 04/09/20  Yes Love, Ivan Anchors, PA-C  baclofen (LIORESAL) 10 MG tablet Take 1 tablet (10 mg total) by mouth 3 (three) times daily. 07/26/21  Yes Margarette Canada, NP  cholecalciferol (VITAMIN D3) 25 MCG (1000 UNIT) tablet Take 1,000 Units by mouth daily.   Yes [provider]  fludrocortisone (FLORINEF) 0.1 MG tablet TAKE 1 TABLET (0.1 MG TOTAL) BY MOUTH DAILY. Patient taking differently: Take 0.1 mg by mouth as needed. 08/22/20  Yes Minna Merritts, MD  rosuvastatin (CRESTOR) 40 MG tablet TAKE 1 TABLET AT BEDTIME 04/09/21  Yes Karamalegos, Alexander J, DO  sildenafil (REVATIO) 20 MG tablet TAKE 1 TO 5 TABLETS BY MOUTH ABOUT 30 MINUTES PRIOR TO SEX.START WITH 1 THEN INCREASE 12/29/20  Yes Karamalegos, Devonne Doughty, DO  Tiotropium Bromide-Olodaterol (STIOLTO RESPIMAT) 2.5-2.5 MCG/ACT AERS Inhale 2 puffs into the lungs daily. 07/10/21  Yes Karamalegos, Devonne Doughty, DO  triamcinolone cream (KENALOG) 0.1 % Apply 1 application topically 2 (two) times daily. 04/22/21  Yes Verda Cumins, MD  vitamin B-12 (CYANOCOBALAMIN) 1000 MCG tablet Take 1 tablet (1,000 mcg total) by mouth daily. 10/29/20  Yes Karamalegos, Devonne Doughty, DO  vitamin C (ASCORBIC ACID) 500 MG tablet Take 500 mg by mouth daily. Am   Yes [provider]    Family History Family History  Problem Relation Age of Onset   Diabetes Mother    Heart attack Mother 62   Cancer Father        liver    COPD Brother    HIV/AIDS Brother    Prostate cancer Neg Hx    Colon cancer Neg Hx     Social History Social History   Tobacco Use   Smoking status: Former    Packs/day: 1.50    Years: 49.00    Pack years: 73.50    Types: Cigarettes    Start date: 1970    Quit date: 09/14/2017    Years since quitting: 3.8   Smokeless tobacco: Former  Scientific laboratory technician Use: Never used  Substance Use Topics   Alcohol use: Yes    Comment: 6 pack on weekend   Drug use: No     Allergies   Patient has no known allergies.   Review of Systems Review of Systems  Constitutional:  Negative for activity change, appetite change and fever.  Musculoskeletal:  Positive for neck pain.  Negative for neck stiffness.  Skin:  Negative for color change.  Neurological:  Negative for weakness and numbness.  Hematological: Negative.   Psychiatric/Behavioral: Negative.      Physical Exam Triage Vital Signs ED Triage Vitals  Enc Vitals Group     BP 07/26/21 1300 (!) 154/108     Pulse Rate 07/26/21 1300 79     Resp 07/26/21 1300 18     Temp 07/26/21 1300 97.7 F (36.5 C)     Temp Source 07/26/21 1300 Oral     SpO2 07/26/21 1300 99 %     Weight 07/26/21 1259 170 lb 9.6 oz (77.4 kg)     Height 07/26/21 1259 6' (1.829 m)  Head Circumference --      Peak Flow --      Pain Score 07/26/21 1258 10     Pain Loc --      Pain Edu? --      Excl. in [place]? --    No data found.  Updated Vital Signs BP (!) 154/108 (BP Location: Left Arm)   Pulse 79   Temp 97.7 F (36.5 C) (Oral)   Resp 18   Ht 6' (1.829 m)   Wt 170 lb 9.6 oz (77.4 kg)   SpO2 99%   BMI 23.14 kg/m   Visual Acuity Right Eye Distance:   Left Eye Distance:   Bilateral Distance:    Right Eye Near:   Left Eye Near:    Bilateral Near:     Physical Exam Vitals and nursing note reviewed.  Constitutional:      General: He is not in acute distress.    Appearance: Normal appearance. He is normal weight. He is not ill-appearing.  HENT:     Head: Normocephalic and atraumatic.  Cardiovascular:     Rate and Rhythm: Normal rate and regular rhythm.     Pulses: Normal pulses.     Heart sounds: Normal heart sounds. No murmur heard.   No gallop.  Pulmonary:     Effort: Pulmonary effort is normal.     Breath sounds: Normal breath sounds. No wheezing, rhonchi or rales.  Musculoskeletal:        General: Tenderness present. No swelling or deformity. Normal range of motion.  Skin:    General: Skin is warm and dry.     Capillary Refill: Capillary refill takes less than 2 seconds.     Findings: No bruising or erythema.  Neurological:     General: No focal deficit present.     Mental Status: He is alert and  oriented to person, place, and time.  Psychiatric:        Mood and Affect: Mood normal.        Behavior: Behavior normal.        Thought Content: Thought content normal.        Judgment: Judgment normal.     UC Treatments / Results  Labs (all labs ordered are listed, but only abnormal results are displayed) Labs Reviewed - No data to display  EKG   Radiology No results found.  Procedures Procedures (including critical care time)  Medications Ordered in UC Medications - No data to display  Initial Impression / Assessment and Plan / UC Course  I have reviewed the triage vital signs and the nursing notes.  Pertinent labs & imaging results that were available during my care of the patient were reviewed by me and considered in my medical decision making (see chart for details).  Patient is a nontoxic, though irritable, 65 year old male here for evaluation of right-sided neck pain that is been ongoing for a month.  Physical exam reveals tension in the right cervical paraspinous region along the path of the levator scapula.  There is no midline spinous tenderness.  Patient states that the pain increases with extension of his neck but not flexion, right lateral flexion but not left lateral flexion.  It also increases with right lateral rotation but not left lateral.  Patient's exam is consistent with inflammation of his levator scapula and possible cervical strain.  I have advised the patient that he needs to follow the dosing card for his Voltaren gel that is in the package so  he can make sure he is getting the proper amount.  I will provide a prescription for baclofen to help with muscle spasm that may be adding to his pain.  I have also encouraged the patient to perform home physical therapy, and I have given him a handout, and to make use of acupuncture and massage therapy services that he has access to.  Patient was advised to follow-up with his primary care provider for referral to  physical therapy if his symptoms do not improve.   Final Clinical Impressions(s) / UC Diagnoses   Final diagnoses:  Acute strain of neck muscle, initial encounter     Discharge Instructions      Continue to use the Voltaren gel and follow the dosing card in the package.  Start taking the Baclofen 3 times a day as needed for muscle spasm.  Follow the neck exercises given at discharge.  Massage therapy can also be helpful in managing musculoskeletal pain.   Acupuncture can be helpful as well.      ED Prescriptions     Medication Sig Dispense Auth. Provider   baclofen (LIORESAL) 10 MG tablet Take 1 tablet (10 mg total) by mouth 3 (three) times daily. 29 each Margarette Canada, NP      PDMP not reviewed this encounter.   Margarette Canada, NP 07/26/21 1415

## 2021-07-26 NOTE — Discharge Instructions (Signed)
Continue to use the Voltaren gel and follow the dosing card in the package.  Start taking the Baclofen 3 times a day as needed for muscle spasm.  Follow the neck exercises given at discharge.  Massage therapy can also be helpful in managing musculoskeletal pain.   Acupuncture can be helpful as well.

## 2021-07-26 NOTE — ED Triage Notes (Signed)
Pt here with C/O neck pain for over a month, went to see his PCP was told to put Volteren Gel, and pt states it hasn't helps, seems to be getting worst. No xrays.

## 2021-08-04 ENCOUNTER — Other Ambulatory Visit (HOSPITAL_COMMUNITY): Payer: Self-pay | Admitting: Interventional Radiology

## 2021-08-04 DIAGNOSIS — I771 Stricture of artery: Secondary | ICD-10-CM

## 2021-08-04 NOTE — Telephone Encounter (Signed)
Received voicemail from patient's wife regarding scheduling CT. She has not yet heard anything from interventional radiology. I reached out to [facility] in that department and the patient's CT authorization was just approved, this is why it has not been scheduled yet. [facility] states they will reach out to patient to schedule today.

## 2021-08-05 ENCOUNTER — Telehealth (HOSPITAL_COMMUNITY): Payer: Self-pay

## 2021-08-05 NOTE — Telephone Encounter (Signed)
Called to schedule cta, no answer, left vm. AW  

## 2021-08-18 ENCOUNTER — Other Ambulatory Visit: Payer: Self-pay

## 2021-08-18 ENCOUNTER — Ambulatory Visit (HOSPITAL_COMMUNITY)
Admission: RE | Admit: 2021-08-18 | Discharge: 2021-08-18 | Disposition: A | Discharge status: Home / Self Care | Payer: Medicare HMO | Source: Ambulatory Visit | Attending: Interventional Radiology | Admitting: Interventional Radiology

## 2021-08-18 DIAGNOSIS — M47812 Spondylosis without myelopathy or radiculopathy, cervical region: Secondary | ICD-10-CM | POA: Diagnosis not present

## 2021-08-18 DIAGNOSIS — I771 Stricture of artery: Secondary | ICD-10-CM | POA: Insufficient documentation

## 2021-08-18 DIAGNOSIS — I6523 Occlusion and stenosis of bilateral carotid arteries: Secondary | ICD-10-CM | POA: Diagnosis not present

## 2021-08-18 LAB — POCT I-STAT CREATININE: Creatinine, Ser: 1.2 mg/dL (ref 0.61–1.24)

## 2021-08-18 MED ORDER — SODIUM CHLORIDE (PF) 0.9 % IJ SOLN
INTRAMUSCULAR | Status: AC
  Filled 2021-08-18: qty 50

## 2021-08-18 MED ORDER — IOHEXOL 350 MG/ML SOLN
75.0000 mL | Freq: Once | INTRAVENOUS | Status: AC | PRN
  Administered 2021-08-18: 75 mL via INTRAVENOUS

## 2021-08-25 ENCOUNTER — Telehealth (HOSPITAL_COMMUNITY): Payer: Self-pay

## 2021-08-25 NOTE — Telephone Encounter (Signed)
Pt's wife agreed to f/u in 6 months with cta head/neck. She was concerned about his dizziness and fatigue, sent a message to our PA, Hayley. Per Hayley, nothing alarming on the scan, symptoms are not new and to f/u with PCP. Wife agreed to f/u with PCP and will see neurology on 09/17/21. AW

## 2021-09-17 ENCOUNTER — Ambulatory Visit: Payer: Medicare HMO | Admitting: Adult Health

## 2021-09-17 ENCOUNTER — Other Ambulatory Visit: Payer: Self-pay

## 2021-09-17 ENCOUNTER — Encounter: Payer: Self-pay | Admitting: Adult Health

## 2021-09-17 VITALS — BP 129/90 | HR 84 | Ht 72.0 in | Wt 173.0 lb

## 2021-09-17 DIAGNOSIS — R2689 Other abnormalities of gait and mobility: Secondary | ICD-10-CM

## 2021-09-17 DIAGNOSIS — I639 Cerebral infarction, unspecified: Secondary | ICD-10-CM

## 2021-09-17 DIAGNOSIS — G4719 Other hypersomnia: Secondary | ICD-10-CM

## 2021-09-17 DIAGNOSIS — I951 Orthostatic hypotension: Secondary | ICD-10-CM

## 2021-09-17 DIAGNOSIS — M542 Cervicalgia: Secondary | ICD-10-CM

## 2021-09-17 DIAGNOSIS — R29818 Other symptoms and signs involving the nervous system: Secondary | ICD-10-CM | POA: Diagnosis not present

## 2021-09-17 NOTE — Patient Instructions (Addendum)
You will be called to be evaluated for possible sleep apnea  Continue aspirin 325 mg daily  and Crestor  for secondary stroke prevention  Continue to follow with Dr. Estanislado Pandy as advised  Continue to follow up with PCP regarding cholesterol and blood pressure management  Maintain strict control of hypertension with blood pressure goal below 130/90 and cholesterol with LDL cholesterol (bad cholesterol) goal below 70 mg/dL.   Signs of a Stroke? Follow the BEFAST method:  Balance Watch for a sudden loss of balance, trouble with coordination or vertigo Eyes Is there a sudden loss of vision in one or both eyes? Or double vision?  Face: Ask the person to smile. Does one side of the face droop or is it numb?  Arms: Ask the person to raise both arms. Does one arm drift downward? Is there weakness or numbness of a leg? Speech: Ask the person to repeat a simple phrase. Does the speech sound slurred/strange? Is the person confused ? Time: If you observe any of these signs, call 911.       Thank you for coming to see [place] at [facility]. I hope we have been able to provide you high quality care today.  You may receive a patient satisfaction survey over the next few weeks. We would appreciate your feedback and comments so that we may continue to improve ourselves and the health of our patients.

## 2021-09-17 NOTE — Progress Notes (Signed)
[facility] [address] [place]. [place] [address] [phone]       STROKE FOLLOW UP NOTE  [name] Date of Birth:  1955/12/20 Medical Record Number:  [id number]   Reason for Referral:  stroke follow up    SUBJECTIVE:   CHIEF COMPLAINT:  Chief Complaint  Patient presents with   Follow-up    Rm 3 alone Pt is well and stable, no new stroke concerns Mentions he gets light headed if he stands up quick and goes down stairs.      HPI:   Update 09/17/2021 JM: Returns for 35-month stroke follow-up unaccompanied.  Overall stable without new stroke/TIA symptoms. Residual left hand deficits stable and occasional dragging left leg with fatigue (present since stroke).  Compliant on aspirin and Crestor without side effects.  Blood pressure today 129/90. Routinely monitors at home - if sleeps well, am SBP can be around 110-120 but if did not sleep well will be much higher. Continued c/o lightheadedness/dizziness upon standing which was discussed at prior visit. Remains on Florinef 0.1mg  PRN for orthostatic hypotension managed by PCP/cardiology. Does c/o worsening balance over the past 6 months - worse when going up and down stairs and looking up for prolonged period of time then quickly looking down. Denies vision changes, headache or any other neuro deficit associated. Was seen by PCP 11/4 for neck pain and then at urgent care on 11/20 for the same. Rx'd baclofen and did one visit of acupuncture - has improved some. No longer on baclofen. Has had to adjust sleeping position. Fatigue improved some but will still have difficulty sleeping at night at times and decreased endurance and greater fatigue towards end of day.  Remains on vitamin B12 and vitamin D managed by PCP.  He has previously declined pursuing sleep evaluation for possible sleep apnea but now willing to proceed. No further concerns at this time.     History provided for reference purposes only Update  03/12/2021 JM: [name] returns for 38-month stroke follow-up.  Stable from stroke standpoint without new stroke/TIA symptoms.  Reports residual left finger tip numbness.  He continues to work and will have some difficulty with fine motor skills of left hand as he is left-hand dominant but at times able to compensate with his right hand.  Compliant aspirin and Crestor associated side effects.  Blood pressure today 135/93.  Daytime fatigue continues especially towards the end of the day after work.  Completed lab work by PCP which did show vitamin B12 and D deficiency and currently on supplement.  He plans on repeating lab work at the end of next month.  He has not yet had follow-up with Dr. Estanislado Pandy.  He also complains of 1 month onset of lightheadedness and dizziness - usually lasts approx 30 seconds.  Specific triggers include looking up, standing up too quickly from a seated position or when bending over.  He feels like these are the similar symptoms he was previously having with low blood pressure (previously discussed in Septembers appt).  He does endorse drinking 6-8 bottles of water per day and does eat throughout the day.  These typically occur when he is at work so he has not been able to check his blood pressure.  He does have Florinef which he will take as needed for low blood pressure which he will have to use on occasion but typically blood pressure within goal.  Orthostatics: Laying: 132/89, HR 78 Sitting: 129/86, HR 73 Standing: 105/78, HR  83   Update 09/09/2020 JM: [name] returns for stroke follow-up.  Reports residual left hand fingertip numbness and left facial weakness.  Denies residual speech impairment or cognitive deficit.  He has since returned back to driving and working without difficulty although does experience excessive fatigue by the end of the day - he will typically fall asleep in chair watching TV typically worse after increased exertion. He does not always get adequate sleep  at night and at times will have to take a " generic OTC sleeping pill".  Denies new or worsening stroke/TIA symptoms.  Remains on aspirin and Crestor for secondary stroke prevention without side effects.  Blood pressure today 142/96. Monitors at home and typically 120-125/80s.  Remains on Florinef 0.1 mg daily as needed typically is SBP less than 120.  He does have multiple questions regarding CTA head/neck that was completed by Dr. Estanislado Pandy 06/2020 which showed continued bilateral ICA occlusions.  No further concerns at this time.  Initial visit 05/07/2020 JM: 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 He does report dizziness and lightheadedness upon standing. Monitors blood pressures at home which has been ranging 110-120/70s over the past week. No further concerns  Stroke admission 03/27/2020 [name] is a 66 y.o. male with history of COPD who presented on 03/27/2020 with L sided  weakness.  Stroke work-up revealed right MCA and multiple right and left anterior circulation infarcts s/p tPA and IR with partial revascularization of R MCA and s/p angioplasty for acute R ICA occlusion with reocclusion with resultant small SAH, most likely secondary to large vessel disease with questionable dissection due to coughing episode vs hypothyroid state from parotid neoplasm.  Recommended aspirin alone until [facility] resolves and then consider DAPT for 3 months then aspirin alone.  Incidental finding of parotid mass on MRI suspicious for primary neoplasm and recommended ENT for further evaluation.  Carotid stenosis with chronic left ICA occlusion and acute right ICA occlusion s/p R ICA angioplasty with reocclusion and questionable dissection from cough with long-term BP goal 130-150.  LDL 82 and recommended continuation of Crestor 20 mg daily.  Other stroke risk factors include advanced age, former tobacco use, former smokeless tobacco use and EtOH use.  No prior stroke history.  Residual deficits of mild dysarthria, left upper and lower facial weakness, dysphagia, and mild left hemiparesis and discharged to CIR for ongoing therapy needs.  Stroke:   R MCA and multiple R and L anterior circulation infarcts s/p tPA and IR w/ partial revascularization with resultant small SAH, most likely secondary to large vessel disease with ? dissection due to coughing episodes, vs. Hypercoagulable  state from parotid neoplasm  CT head dense R M1. Subtle asymmetric hypodensity R caudate and lentiform nucleus. ASPECTS 8   CTA head & neck R ICA LVO at bifurcation origin w/ cavernous reconstitution and reocclusion proximal R M1. Good MCA collaterals. L ICA occlusion at bifurcation origin. Short severe L ICA cavernous stenosis.   Cerebral angio partial revascularization R MCA dominant division w/ TICI2a/TICI2b. S/p angioplasty acute R ICA occlusion w/ reocclusion    Post IR CT mild to mod RT peri-insular and post frontal  convexity contrast stain vs SAH  MRI  R MCA frontal lobe infarct w/ possible mild petechial hemorrhage. B ICA siphon high-grade stenosis vs occlusion. R MCA SAH.  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 No source of embolus  LDL 82  HgbA1c 5.6 VTE prophylaxis - SCDs  No antithrombotic prior to admission, now on ASA alone due to small SAH. Plan to repeat CT on 7/30, if SAH resolves or nearly resoved, will consider DAPT for 3 months.  Therapy recommendations:  CIR - consult placed Disposition:  pending - await for CIR input, if not a candidate will d/c home     ROS:   14 system review of systems performed and negative with exception of see HPI  PMH:  Past Medical History:  Diagnosis Date   COPD (chronic obstructive pulmonary disease) ([place])    mild   COVID-19 09/2019   Erectile dysfunction    Headache    daily, stress headaches   Knee pain, left    Seasonal allergies    Stroke ([place])    Tobacco dependence     PSH:  Past Surgical History:  Procedure Laterality Date   BACK SURGERY     metal plate in back   COLONOSCOPY     COLONOSCOPY WITH PROPOFOL N/A 04/26/2019   Procedure: COLONOSCOPY WITH PROPOFOL;  Surgeon: Jonathon Bellows, MD;  Location: [facility] ENDOSCOPY;  Service: Gastroenterology;  Laterality: N/A;   HERNIA REPAIR Right    IR ANGIO INTRA EXTRACRAN SEL COM CAROTID INNOMINATE UNI L MOD SED  03/27/2020   IR ANGIO VERTEBRAL SEL SUBCLAVIAN INNOMINATE UNI L MOD SED  03/27/2020   IR ANGIO VERTEBRAL SEL VERTEBRAL UNI R MOD SED  03/27/2020   IR CT HEAD LTD  03/27/2020   IR PERCUTANEOUS ART THROMBECTOMY/INFUSION INTRACRANIAL INC DIAG ANGIO  03/27/2020   KNEE ARTHROSCOPY Left 03/28/2015   Procedure: Arthroscopic partial medial meniscectomy plus chondral debridement;  Surgeon: Leanor Kail, MD;  Location: [facility];  Service: Orthopedics;  Laterality: Left;   RADIOLOGY WITH ANESTHESIA N/A  03/27/2020   Procedure: IR WITH ANESTHESIA;  Surgeon: Luanne Bras, MD;  Location: [facility];  Service: Radiology;  Laterality: N/A;    Social History:  Social History   Socioeconomic History   Marital status: Married    Spouse name: Not on file   Number of children: Not on file   Years of education: Not on file   Highest education level: Not on file  Occupational History   Not on file  Tobacco Use   Smoking status: Former    Packs/day: 1.50    Years: 49.00    Pack years: 73.50    Types: Cigarettes    Start date: 44    Quit date: 09/14/2017    Years since quitting: 4.0   Smokeless tobacco: Former  Scientific laboratory technician Use: Never used  Substance and Sexual  Activity   Alcohol use: Yes    Comment: 6 pack on weekend   Drug use: No   Sexual activity: Not on file  Other Topics Concern   Not on file  Social History Narrative   Not on file   Social Determinants of Health   Financial Resource Strain: Low Risk    Difficulty of Paying Living Expenses: Not hard at all  Food Insecurity: No Food Insecurity   Worried About Charity fundraiser in the Last Year: Never true   [place] in the Last Year: Never true  Transportation Needs: No Transportation Needs   Lack of Transportation (Medical): No   Lack of Transportation (Non-Medical): No  Physical Activity: Inactive   Days of Exercise per Week: 0 days   Minutes of Exercise per Session: 0 min  Stress: No Stress Concern Present   Feeling of Stress : Not at all  Social Connections: Not on file  Intimate Partner Violence: Not on file    Family History:  Family History  Problem Relation Age of Onset   Diabetes Mother    Heart attack Mother 65   Cancer Father        liver    COPD Brother    HIV/AIDS Brother    Prostate cancer Neg Hx    Colon cancer Neg Hx     Medications:   Current Outpatient Medications on File Prior to Visit  Medication Sig Dispense Refill   albuterol (VENTOLIN HFA) 108 (90 Base) MCG/ACT  inhaler INHALE 1 TO 2 PUFFS EVERY 4 HOURS AS NEEDED FOR WHEEZING OR SHORTNESS OF BREATH 1 each 2   aspirin EC 325 MG EC tablet Take 1 tablet (325 mg total) by mouth daily. 90 tablet 0   baclofen (LIORESAL) 10 MG tablet Take 1 tablet (10 mg total) by mouth 3 (three) times daily. 30 each 0   cholecalciferol (VITAMIN D3) 25 MCG (1000 UNIT) tablet Take 1,000 Units by mouth daily.     fludrocortisone (FLORINEF) 0.1 MG tablet TAKE 1 TABLET (0.1 MG TOTAL) BY MOUTH DAILY. (Patient taking differently: Take 0.1 mg by mouth as needed.) 90 tablet 2   rosuvastatin (CRESTOR) 40 MG tablet TAKE 1 TABLET AT BEDTIME 90 tablet 2   sildenafil (REVATIO) 20 MG tablet TAKE 1 TO 5 TABLETS BY MOUTH ABOUT 30 MINUTES PRIOR TO SEX.START WITH 1 THEN INCREASE 30 tablet 5   Tiotropium Bromide-Olodaterol (STIOLTO RESPIMAT) 2.5-2.5 MCG/ACT AERS Inhale 2 puffs into the lungs daily. 12 g 3   vitamin B-12 (CYANOCOBALAMIN) 1000 MCG tablet Take 1 tablet (1,000 mcg total) by mouth daily.     vitamin C (ASCORBIC ACID) 500 MG tablet Take 500 mg by mouth daily. Am     No current facility-administered medications on file prior to visit.    Allergies:  No Known Allergies    OBJECTIVE:  Physical Exam  Vitals:   09/17/21 0729  BP: 129/90  Pulse: 84  Weight: 173 lb (78.5 kg)  Height: 6' (1.829 m)   Body mass index is 23.46 kg/m. No results found.  General: well developed, well nourished, pleasant middle-age Caucasian male, seated, in no evident distress Head: head normocephalic and atraumatic.   Neck: supple with no carotid or supraclavicular bruits Cardiovascular: regular rate and rhythm, no murmurs Musculoskeletal: no deformity Skin:  no rash/petichiae Vascular:  Normal pulses all extremities   Neurologic Exam Mental Status: Awake and fully alert. Fluent speech and language. Oriented to place and time. Recent memory  subjectively impaired and remote memory intact. Attention span, concentration and fund of knowledge  appropriate during visit. Unable to complete further cog eval due to time constraints.  Mood and affect appropriate.  Cranial Nerves: Pupils equal, briskly reactive to light. Extraocular movements full without nystagmus.  Visual fields full to confrontation. Hearing intact. Facial sensation intact.  Mild left lower facial weakness. Motor: Normal bulk and tone. Normal strength in all tested extremity muscles  Sensory.: intact to touch , pinprick , position and vibratory sensation.  Coordination: Rapid alternating movements normal in all extremities except slight decrease left hand dexterity. Finger-to-nose and heel-to-shin performed accurately bilaterally.  Mildly orbits right arm over left arm Gait and Station: Arises from chair without difficulty. Stance is normal. Gait demonstrates normal stride length and balance without use of assistive device. Moderate difficulty with tandem walk and heel toe.  Romberg negative but subjectively felt lightheaded after Reflexes: 1+ and symmetric. Toes downgoing.       ASSESSMENT: Ethan Fitzgerald is a 66 y.o. year old male with R MCA and multiple b/l anterior circulation strokes on 03/27/2020 s/p tPA and IR with partial revascularization and s/p angioplasty for acute right ICA occlusion with reocclusion, infarct most likely secondary to large vessel disease with questionable dissection vs hypercoagulable state from parotid neoplasm. Vascular risk factors include parotid mass, carotid stenosis, HLD, former tobacco use and EtOH use.      PLAN:  R MCA stroke, B anterior circulation infarcts:  Residual deficit: Left hand fingertip numbness with slight decreased dexterity and facial weakness and subjective occasional LLE weakness with fatigue - stable.   Continue aspirin 325 mg daily and Crestor 20 mg daily for secondary stroke prevention.  Discussed secondary stroke prevention measures and importance of close PCP follow up for aggressive stroke risk factor  management  Excessive daytime fatigue: although some improvement since B12 supplement, this persists. Now agreeable to proceed with sleep study. Referral placed to [facility]. Discussed untreated sleep apnea increased risk of stroke and cardiovascular disease as well as possibly contributing to short-term memory difficulties Imbalance/gait impairment: Present past 6 months Recent imaging (CTA head/neck) no evidence of new strokes or abnormality 3 mo onset of cervical pain - discussed possibly contributing factor - will proceed with MR cervical to assess for cervical stenosis HLD:  LDL goal <70.  Prior LDL 56 (12/22/2020) on Crestor 20 mg daily per PCP Hypotension:  Orthostatic hypotension: Intermittent dizziness/lightheadedness associated with position change Currently on Florinef 0.1 mg PRN managed by PCP BP goal 130-150 to ensure adequate perfusion Carotid stenosis:  Followed by Dr. Estanislado Pandy. Recent CTA 08/2021 R ICA occluded from cervical bifurcation through terminal ICA and reconstitution of left ICA at the level of ophthalmic artery, vertebral arteries unremarkable. Plans to repeat imaging around 02/2022   F/u after completion of sleep evaluation    CC:  Olin Hauser, DO    I spent 38 minutes of face-to-face and non-face-to-face time with patient.  This included previsit chart review, lab review, study review, order entry, electronic health record documentation, patient education regarding prior stroke and etiology as well as secondary stroke prevention measures importance of aggressive stroke risk factor management, residual deficits, continued daytime fatigue and concern for possible sleep apnea, indication for follow-up with IR, dizziness and lightheadedness in setting of orthostatic hypotension, worsening gait/balance and answered all other questions to patients satisfaction  [name], [facility]  [facility] [address] [place], [place] [address]  Phone [phone] Fax  [phone] Note: This document was prepared with digital dictation and possible smart phrase technology. Any transcriptional errors that result from this process are unintentional.

## 2021-09-21 ENCOUNTER — Telehealth: Payer: Self-pay | Admitting: Adult Health

## 2021-09-21 NOTE — Telephone Encounter (Signed)
Ethan Fitzgerald Ethan Fitzgerald: [phone] (exp. 09/21/21 to 10/21/21) order sent to GI, they will reach out to the patient to schedule.

## 2021-09-25 NOTE — Telephone Encounter (Signed)
Signing encounter, see previous note 11/21/20 °

## 2021-09-28 ENCOUNTER — Other Ambulatory Visit: Payer: Self-pay | Admitting: Adult Health

## 2021-09-28 ENCOUNTER — Ambulatory Visit
Admission: RE | Admit: 2021-09-28 | Discharge: 2021-09-28 | Disposition: A | Discharge status: Home / Self Care | Payer: Medicare HMO | Source: Ambulatory Visit | Attending: Adult Health | Admitting: Adult Health

## 2021-09-28 ENCOUNTER — Telehealth: Payer: Self-pay | Admitting: Adult Health

## 2021-09-28 DIAGNOSIS — M2578 Osteophyte, vertebrae: Secondary | ICD-10-CM | POA: Diagnosis not present

## 2021-09-28 DIAGNOSIS — M542 Cervicalgia: Secondary | ICD-10-CM

## 2021-09-28 DIAGNOSIS — M4312 Spondylolisthesis, cervical region: Secondary | ICD-10-CM | POA: Diagnosis not present

## 2021-09-28 DIAGNOSIS — R200 Anesthesia of skin: Secondary | ICD-10-CM | POA: Diagnosis not present

## 2021-09-28 DIAGNOSIS — R2689 Other abnormalities of gait and mobility: Secondary | ICD-10-CM

## 2021-09-28 NOTE — Telephone Encounter (Signed)
Neurosurgery referral has been sent to [facility]. Phone: [phone].

## 2021-10-13 ENCOUNTER — Ambulatory Visit: Payer: Medicare HMO

## 2021-10-13 DIAGNOSIS — M542 Cervicalgia: Secondary | ICD-10-CM | POA: Diagnosis not present

## 2021-10-29 ENCOUNTER — Telehealth: Payer: Self-pay

## 2021-10-29 DIAGNOSIS — F17200 Nicotine dependence, unspecified, uncomplicated: Secondary | ICD-10-CM | POA: Insufficient documentation

## 2021-10-29 DIAGNOSIS — E781 Pure hyperglyceridemia: Secondary | ICD-10-CM | POA: Insufficient documentation

## 2021-10-29 DIAGNOSIS — R7303 Prediabetes: Secondary | ICD-10-CM | POA: Insufficient documentation

## 2021-10-29 IMAGING — CT CT HEAD W/O CM
4 series · 16 of 47 positions shown, 18 images · non-contrast
Comparison: 03/28/2020

CLINICAL DATA: Follow-up stroke

EXAM:
CT HEAD WITHOUT CONTRAST
TECHNIQUE: Contiguous axial images were obtained from the base of the skull
through the vertex without intravenous contrast.

[Series 3: head wo · axial · 0.42mm/px · z∈[-274,-148]mm · 7 of 35 slices shown, 9 images]
[im 5/35  brain]
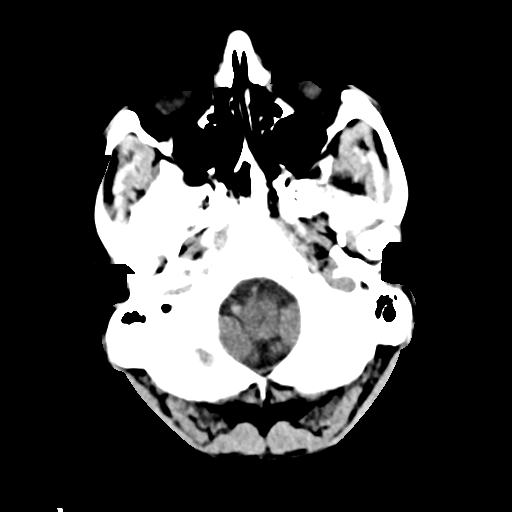
[im 5/35  bone]
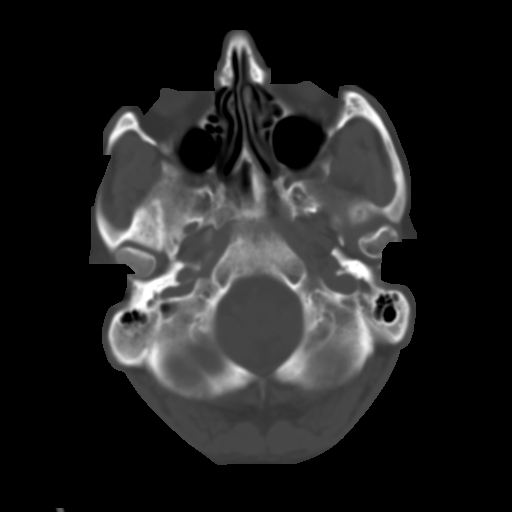
[im 9/35  brain]
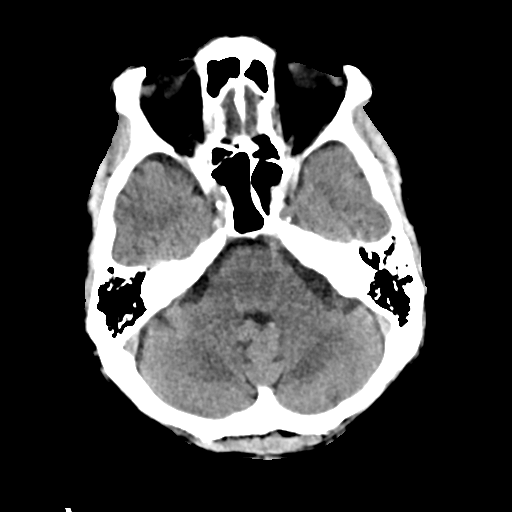
[im 13/35  brain]
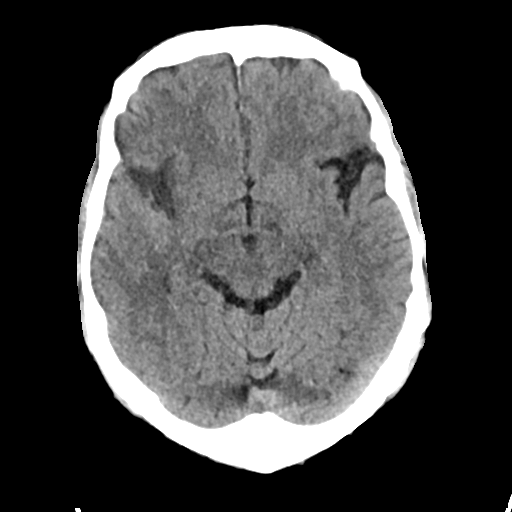
[im 18/35  brain]
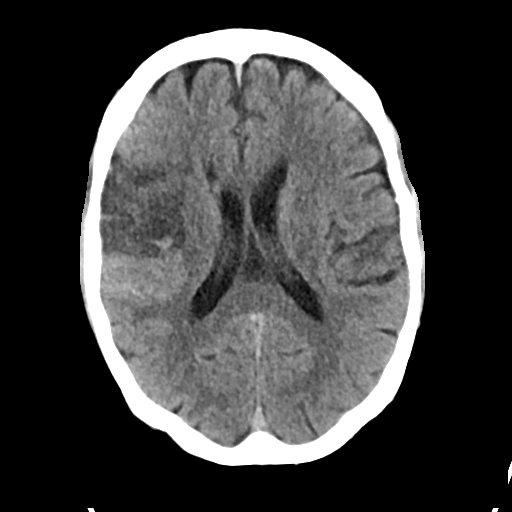
[im 22/35  brain]
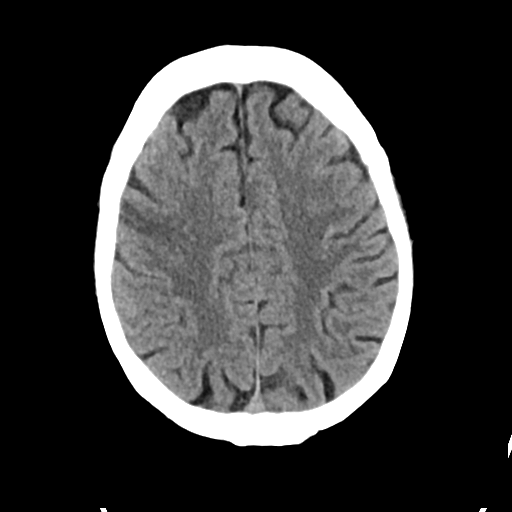
[im 22/35  bone]
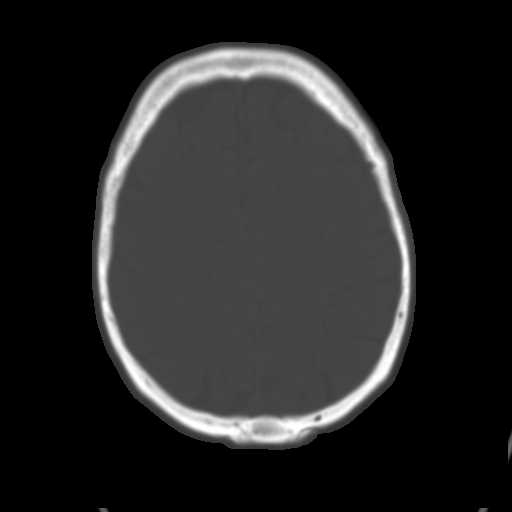
[im 26/35  brain]
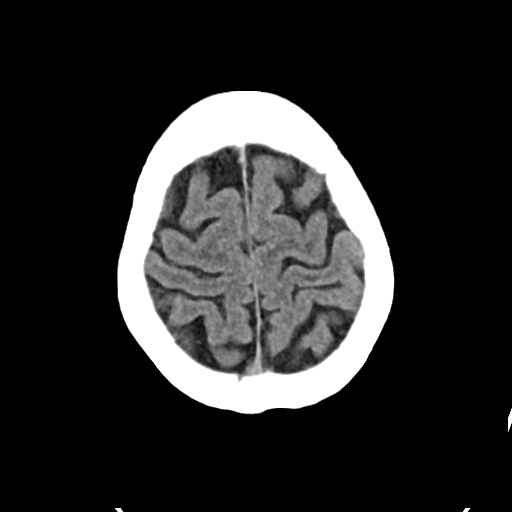
[im 30/35  brain]
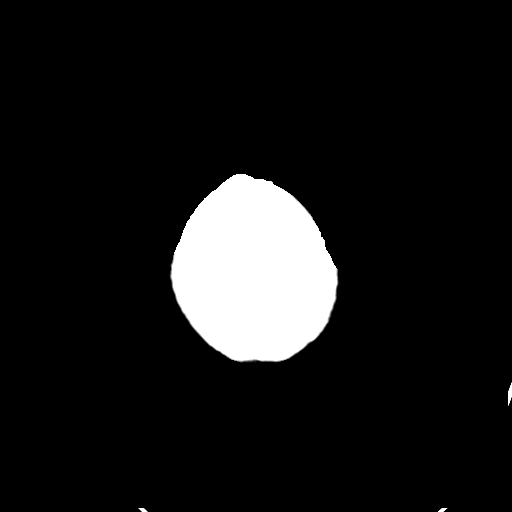

[Series 4: head bone · axial · 0.42mm/px · z∈[-278,-244]mm · 3 of 86 slices shown]
[im 9/86  bone]
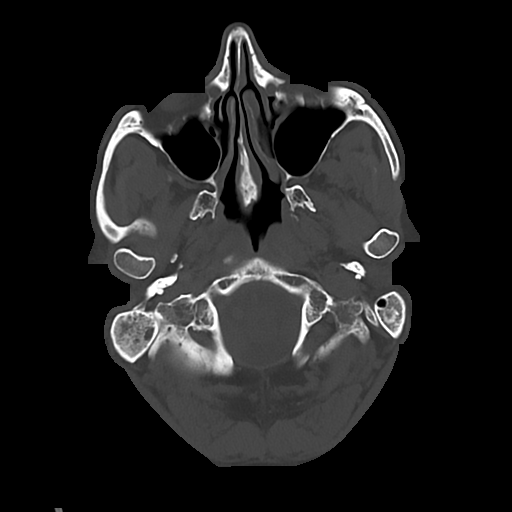
[im 18/86  bone]
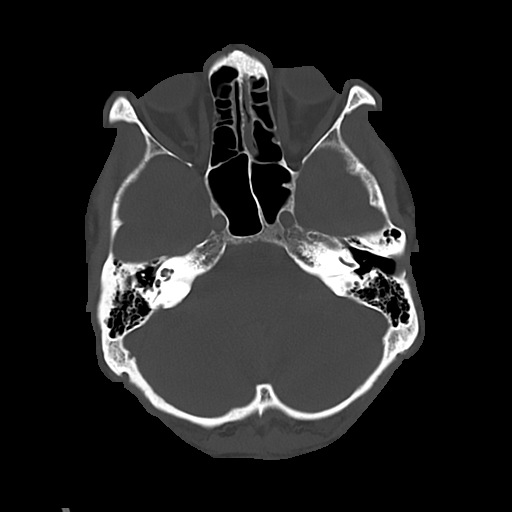
[im 26/86  bone]
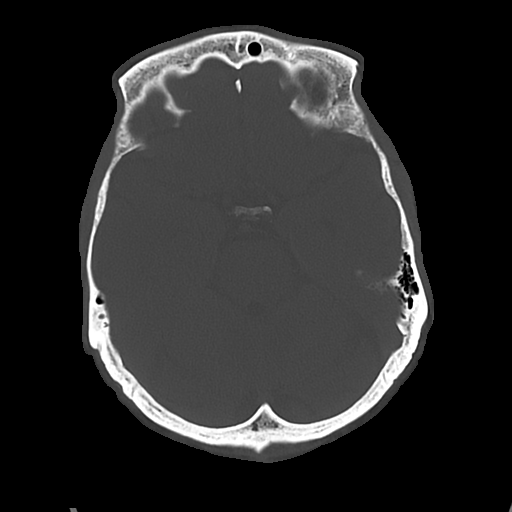

[Series 5: cor soft · coronal · 0.33mm/px · 3 of 71 slices shown]
[im 24/71  brain]
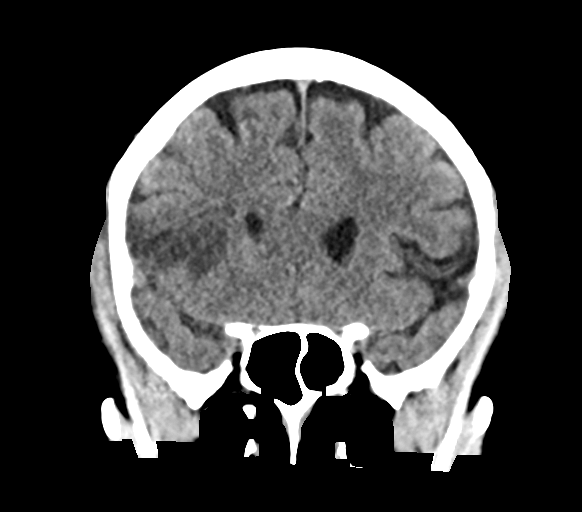
[im 32/71  brain]
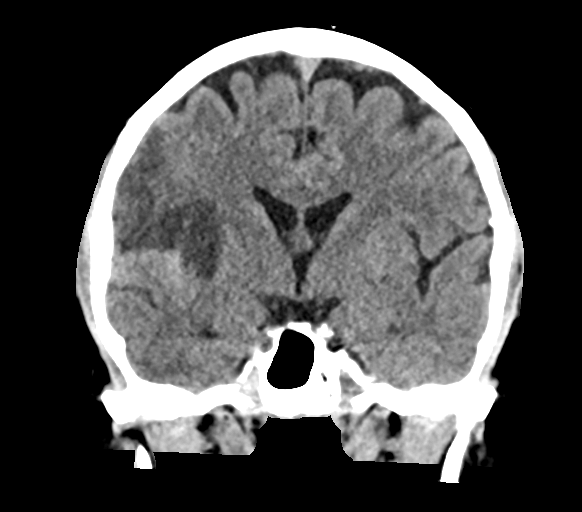
[im 39/71  brain]
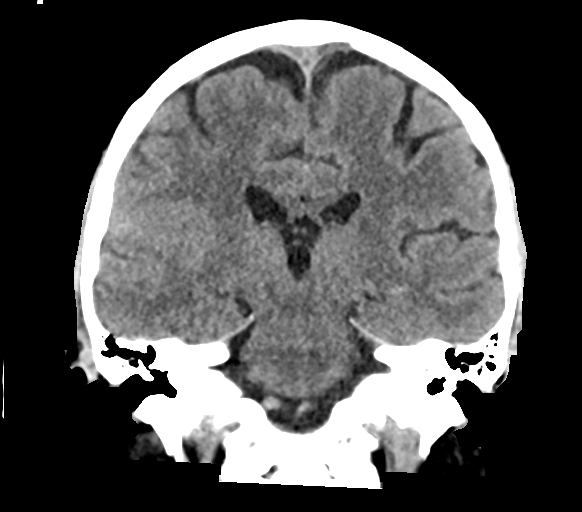

[Series 6: sag soft · sagittal · 0.33mm/px · 3 of 58 slices shown]
[im 20/58  brain]
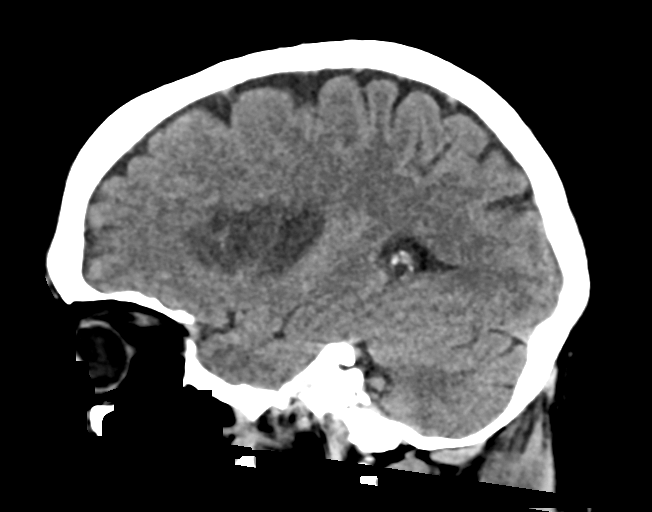
[im 29/58  brain]
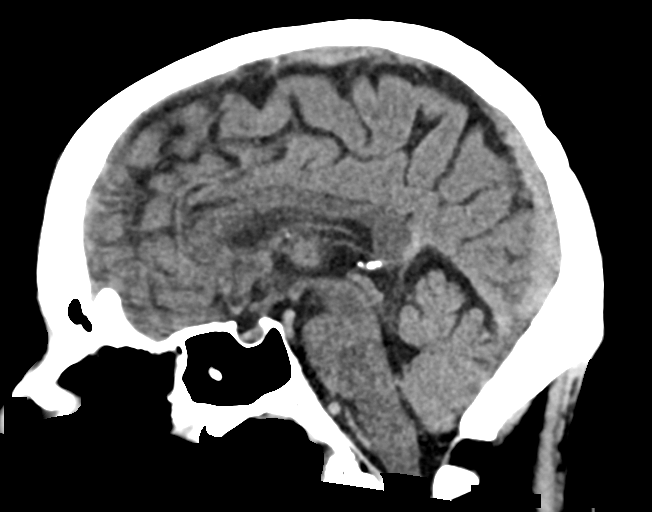
[im 39/58  brain]
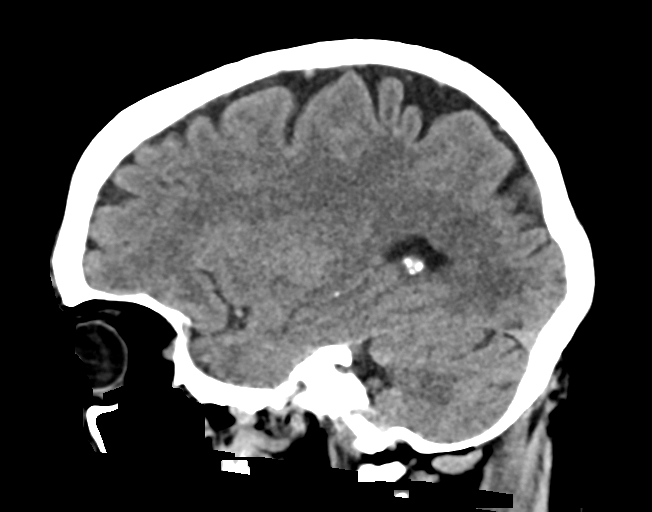

[16 of 47 positions shown; findings below may reference images not displayed]

FINDINGS: Brain: Again noted are changes consistent with the given clinical
history of right middle cerebral artery infarct degree of
subarachnoid hemorrhage has nearly completely resolved when compared
with the prior exam. No new focal hemorrhage or infarct is seen. No
extra-axial fluid collection is noted.

Vascular: No hyperdense vessel or unexpected calcification.

Skull: Normal. Negative for fracture or focal lesion.

Sinuses/Orbits: No acute finding.

Other: None.
IMPRESSION: Right middle cerebral artery infarct with subarachnoid hemorrhage
although the degree of hemorrhage has improved significantly in the
interval from the prior exam.

No new focal abnormality is seen.

## 2021-10-30 ENCOUNTER — Other Ambulatory Visit: Payer: Self-pay

## 2021-10-30 ENCOUNTER — Telehealth: Payer: Self-pay

## 2021-10-30 ENCOUNTER — Ambulatory Visit (INDEPENDENT_AMBULATORY_CARE_PROVIDER_SITE_OTHER): Payer: Medicare HMO

## 2021-10-30 VITALS — BP 100/56 | HR 81 | Temp 97.7°F | Ht 72.0 in | Wt 170.0 lb

## 2021-10-30 DIAGNOSIS — G8929 Other chronic pain: Secondary | ICD-10-CM

## 2021-10-30 DIAGNOSIS — J432 Centrilobular emphysema: Secondary | ICD-10-CM

## 2021-10-30 DIAGNOSIS — Z0000 Encounter for general adult medical examination without abnormal findings: Secondary | ICD-10-CM

## 2021-10-30 DIAGNOSIS — Z1211 Encounter for screening for malignant neoplasm of colon: Secondary | ICD-10-CM

## 2021-10-30 DIAGNOSIS — Z86010 Personal history of colonic polyps: Secondary | ICD-10-CM

## 2021-10-30 MED ORDER — NAPROXEN 500 MG PO TABS: 500.0000 mg | ORAL_TABLET | Freq: Two times a day (BID) | ORAL | 2 refills | Status: DC | PRN

## 2021-10-30 MED ORDER — BACLOFEN 10 MG PO TABS: 10.0000 mg | ORAL_TABLET | Freq: Three times a day (TID) | ORAL | 3 refills | Status: DC

## 2021-10-30 MED ORDER — PEG 3350-KCL-NA BICARB-NACL 420 G PO SOLR: 4000.0000 mL | Freq: Once | ORAL | 0 refills | Status: AC

## 2021-10-30 MED ORDER — STIOLTO RESPIMAT 2.5-2.5 MCG/ACT IN AERS: 2.0000 | INHALATION_SPRAY | Freq: Every day | RESPIRATORY_TRACT | 3 refills | Status: DC

## 2021-10-30 NOTE — Telephone Encounter (Signed)
Patient answered and I got him scheduled

## 2021-10-30 NOTE — Progress Notes (Signed)
Subjective:   Ethan Fitzgerald is a 66 y.o. male who presents for Medicare Annual/Subsequent preventive examination.  Review of Systems           Objective:    Today's Vitals   10/30/21 0814 10/30/21 0822  BP: (!) 100/56   Pulse: 81   Temp: 97.7 F (36.5 C)   TempSrc: Oral   SpO2: 98%   Weight: 170 lb (77.1 kg)   Height: 6' (1.829 m)   PainSc:  10-Worst pain ever   Body mass index is 23.06 kg/m.  Advanced Directives 07/26/2021 10/07/2020 05/08/2020 04/07/2020 04/02/2020 04/02/2020 04/26/2019  Does Patient Have a Medical Advance Directive? No Yes Yes - - Yes Yes  Type of Advance Directive - [place];Living will - - - - -  Does patient want to make changes to medical advance directive? - - - No - Patient declined No - Patient declined No - Patient declined -  Copy of [place] in Chart? - No - copy requested - - - - -    Current Medications (verified) Outpatient Encounter Medications as of 10/30/2021  Medication Sig   albuterol (VENTOLIN HFA) 108 (90 Base) MCG/ACT inhaler INHALE 1 TO 2 PUFFS EVERY 4 HOURS AS NEEDED FOR WHEEZING OR SHORTNESS OF BREATH   aspirin 81 MG chewable tablet    aspirin EC 325 MG EC tablet Take 1 tablet (325 mg total) by mouth daily.   baclofen (LIORESAL) 10 MG tablet Take 1 tablet (10 mg total) by mouth 3 (three) times daily.   cholecalciferol (VITAMIN D3) 25 MCG (1000 UNIT) tablet Take 1,000 Units by mouth daily.   fludrocortisone (FLORINEF) 0.1 MG tablet TAKE 1 TABLET (0.1 MG TOTAL) BY MOUTH DAILY. (Patient taking differently: Take 0.1 mg by mouth as needed.)   rosuvastatin (CRESTOR) 40 MG tablet TAKE 1 TABLET AT BEDTIME   sildenafil (REVATIO) 20 MG tablet TAKE 1 TO 5 TABLETS BY MOUTH ABOUT 30 MINUTES PRIOR TO SEX.START WITH 1 THEN INCREASE   Tiotropium Bromide-Olodaterol (STIOLTO RESPIMAT) 2.5-2.5 MCG/ACT AERS Inhale 2 puffs into the lungs daily.   vitamin B-12 (CYANOCOBALAMIN) 1000 MCG tablet Take 1 tablet (1,000 mcg  total) by mouth daily.   vitamin C (ASCORBIC ACID) 500 MG tablet Take 500 mg by mouth daily. Am   No facility-administered encounter medications on file as of 10/30/2021.    Allergies (verified) Patient has no known allergies.   History: Past Medical History:  Diagnosis Date   COPD (chronic obstructive pulmonary disease) ([place])    mild   COVID-19 09/2019   Erectile dysfunction    Headache    daily, stress headaches   Knee pain, left    Seasonal allergies    Stroke [facility])    Tobacco dependence    Past Surgical History:  Procedure Laterality Date   BACK SURGERY     metal plate in back   COLONOSCOPY     COLONOSCOPY WITH PROPOFOL N/A 04/26/2019   Procedure: COLONOSCOPY WITH PROPOFOL;  Surgeon: Jonathon Bellows, MD;  Location: [place];  Service: Gastroenterology;  Laterality: N/A;   HERNIA REPAIR Right    IR ANGIO INTRA EXTRACRAN SEL COM CAROTID INNOMINATE UNI L MOD SED  03/27/2020   IR ANGIO VERTEBRAL SEL SUBCLAVIAN INNOMINATE UNI L MOD SED  03/27/2020   IR ANGIO VERTEBRAL SEL VERTEBRAL UNI R MOD SED  03/27/2020   IR CT HEAD LTD  03/27/2020   IR PERCUTANEOUS ART THROMBECTOMY/INFUSION INTRACRANIAL INC DIAG ANGIO  03/27/2020  KNEE ARTHROSCOPY Left 03/28/2015   Procedure: Arthroscopic partial medial meniscectomy plus chondral debridement;  Surgeon: Leanor Kail, MD;  Location: [place];  Service: Orthopedics;  Laterality: Left;   RADIOLOGY WITH ANESTHESIA N/A 03/27/2020   Procedure: IR WITH ANESTHESIA;  Surgeon: Luanne Bras, MD;  Location: [place];  Service: Radiology;  Laterality: N/A;   Family History  Problem Relation Age of Onset   Diabetes Mother    Heart attack Mother 66   Cancer Father        liver    COPD Brother    HIV/AIDS Brother    Prostate cancer Neg Hx    Colon cancer Neg Hx    Social History   Socioeconomic History   Marital status: Married    Spouse name: Not on file   Number of children: Not on file   Years of education: Not on file    Highest education level: Not on file  Occupational History   Not on file  Tobacco Use   Smoking status: Former    Packs/day: 1.50    Years: 49.00    Pack years: 73.50    Types: Cigarettes    Start date: 62    Quit date: 09/14/2017    Years since quitting: 4.1   Smokeless tobacco: Former  Scientific laboratory technician Use: Never used  Substance and Sexual Activity   Alcohol use: Yes    Comment: 6 pack on weekend   Drug use: No   Sexual activity: Not on file  Other Topics Concern   Not on file  Social History Narrative   Not on file   Social Determinants of Health   Financial Resource Strain: Not on file  Food Insecurity: Not on file  Transportation Needs: Not on file  Physical Activity: Not on file  Stress: Not on file  Social Connections: Not on file    Tobacco Counseling Counseling given: Not Answered   Clinical Intake:  Pre-visit preparation completed: Yes  Pain : 0-10 Pain Score: 10-Worst pain ever Pain Type: Chronic pain Pain Location: Neck Pain Orientation: Right Pain Onset: More than a month ago Pain Frequency: Constant     Nutritional Risks: None Diabetes: No  How often do you need to have someone help you when you read instructions, pamphlets, or other written materials from your doctor or pharmacy?: 1 - Never  Diabetic?no  Interpreter Needed?: No  Information entered by :: Kirke Shaggy, LPN   Activities of Daily Living In your present state of health, do you have any difficulty performing the following activities: 12/29/2020  Hearing? N  Vision? N  Difficulty concentrating or making decisions? N  Walking or climbing stairs? Y  Dressing or bathing? N  Doing errands, shopping? N  Some recent data might be hidden    Patient Care Team: Olin Hauser, DO as PCP - General (Family Medicine) Vanita Ingles, RN as Case Manager ([place]) Curley Spice, Virl Diamond, RPH-CPP as Pharmacist  Indicate any recent Medical Services you may  have received from other than [facility] providers in the past year (date may be approximate).     Assessment:   This is a routine wellness examination for Ethan Fitzgerald.  Hearing/Vision screen No results found.  Dietary issues and exercise activities discussed:     Goals Addressed   None    Depression Screen PHQ 2/9 Scores 07/10/2021 12/29/2020 10/07/2020 05/23/2020 04/18/2020 12/27/2019 11/05/2019  PHQ - 2 Score 0 0 0 0 6 0 0  PHQ-  9 Score 0 1 - - 10 - -    Fall Risk Fall Risk  07/10/2021 12/29/2020 10/07/2020 05/23/2020 04/18/2020  Falls in the past year? 0 1 1 0 0  Comment - - stroke - -  Number falls in past yr: 0 0 0 - -  Injury with Fall? 0 0 1 - -  Risk for fall due to : - - Medication side effect - -  Follow up Falls evaluation completed Falls evaluation completed Falls evaluation completed;Education provided;Falls prevention discussed - -    FALL RISK PREVENTION PERTAINING TO THE HOME:  Any stairs in or around the home? Yes  If so, are there any without handrails? No  Home free of loose throw rugs in walkways, pet beds, electrical cords, etc? Yes  Adequate lighting in your home to reduce risk of falls? Yes   ASSISTIVE DEVICES UTILIZED TO PREVENT FALLS:  Life alert? No  Use of a cane, walker or w/c? No  Grab bars in the bathroom? No  Shower chair or bench in shower? Yes  Elevated toilet seat or a handicapped toilet? No   TIMED UP AND GO:  Was the test performed? Yes .  Length of time to ambulate 10 feet: 4 sec.   Gait steady and fast without use of assistive device  Cognitive Function:     6CIT Screen 10/07/2020  What Year? 0 points  What month? 0 points  What time? 0 points  Count back from 20 0 points  Months in reverse 0 points  Repeat phrase 2 points  Total Score 2    Immunizations Immunization History  Administered Date(s) Administered   Influenza,inj,Quad PF,6+ Mos 06/22/2018, 06/30/2019   PFIZER(Purple Top)SARS-COV-2 Vaccination 12/29/2019, 01/19/2020    Pneumococcal Conjugate-13 04/03/2019   Tdap 03/18/2017    TDAP status: Up to date  Flu Vaccine status: Declined, Education has been provided regarding the importance of this vaccine but patient still declined. Advised may receive this vaccine at local pharmacy or Health Dept. Aware to provide a copy of the vaccination record if obtained from local pharmacy or Health Dept. Verbalized acceptance and understanding.  Pneumococcal vaccine status: Up to date  Covid-19 vaccine status: Completed vaccines  Qualifies for Shingles Vaccine? Yes   Zostavax completed No   Shingrix Completed?: No.    Education has been provided regarding the importance of this vaccine. Patient has been advised to call insurance company to determine out of pocket expense if they have not yet received this vaccine. Advised may also receive vaccine at local pharmacy or Health Dept. Verbalized acceptance and understanding.  Screening Tests Health Maintenance  Topic Date Due   Zoster Vaccines- Shingrix (1 of 2) Never done   COVID-19 Vaccine (3 - Booster for Pfizer series) 03/15/2020   Pneumonia Vaccine 56+ Years old (2 - PPSV23 if available, else PCV20) 04/02/2020   COLONOSCOPY (Pts 45-29yrs Insurance coverage will need to be confirmed)  04/25/2020   INFLUENZA VACCINE  04/06/2021   TETANUS/TDAP  03/19/2027   Hepatitis C Screening  Completed   HPV VACCINES  Aged Out    Health Maintenance  Health Maintenance Due  Topic Date Due   Zoster Vaccines- Shingrix (1 of 2) Never done   COVID-19 Vaccine (3 - Booster for Pfizer series) 03/15/2020   Pneumonia Vaccine 63+ Years old (2 - PPSV23 if available, else PCV20) 04/02/2020   COLONOSCOPY (Pts 45-43yrs Insurance coverage will need to be confirmed)  04/25/2020   INFLUENZA VACCINE  04/06/2021  Colorectal cancer screening: Type of screening: Colonoscopy. Completed 04/26/19. Repeat every 1 years- referral sent  Lung Cancer Screening: (Low Dose CT Chest recommended if Age  78-80 years, 30 pack-year currently smoking OR have quit w/in 15years.) does qualify.   Lung Cancer Screening Referral: declined referral  Additional Screening:  Hepatitis C Screening: does qualify; Completed 03/27/19  Vision Screening: Recommended annual ophthalmology exams for early detection of glaucoma and other disorders of the eye. Is the patient up to date with their annual eye exam?  Yes  Who is the provider or what is the name of the office in which the patient attends annual eye exams? [facility] If pt is not established with a provider, would they like to be referred to a provider to establish care? No .   Dental Screening: Recommended annual dental exams for proper oral hygiene  Community Resource Referral / Chronic Care Management: CRR required this visit?  No   CCM required this visit?  No      Plan:     I have personally reviewed and noted the following in the patients chart:   Medical and social history Use of alcohol, tobacco or illicit drugs  Current medications and supplements including opioid prescriptions. Patient is not currently taking opioid prescriptions. Functional ability and status Nutritional status Physical activity Advanced directives List of other physicians Hospitalizations, surgeries, and ER visits in previous 12 months Vitals Screenings to include cognitive, depression, and falls Referrals and appointments  In addition, I have reviewed and discussed with patient certain preventive protocols, quality metrics, and best practice recommendations. A written personalized care plan for preventive services as well as general preventive health recommendations were provided to patient.     Dionisio David, LPN   [id number]   Nurse Notes: none

## 2021-10-30 NOTE — Patient Instructions (Signed)
Ethan Fitzgerald , Thank you for taking time to come for your Medicare Wellness Visit. I appreciate your ongoing commitment to your health goals. Please review the following plan we discussed and let me know if I can assist you in the future.   Screening recommendations/referrals: Colonoscopy: 820/20, referral sent Recommended yearly ophthalmology/optometry visit for glaucoma screening and checkup Recommended yearly dental visit for hygiene and checkup  Vaccinations: Influenza vaccine: n/d Pneumococcal vaccine: 04/03/19 Tdap vaccine: 03/18/17 Shingles vaccine: n/d   Covid-19: 12/29/19. 01/19/20  Advanced directives: no  Conditions/risks identified: none  Next appointment: Follow up in one year for your annual wellness visit. 11/05/22 @ 8:20am in person  Preventive Care 68 Years and Older, Male Preventive care refers to lifestyle choices and visits with your health care provider that can promote health and wellness. What does preventive care include? A yearly physical exam. This is also called an annual well check. Dental exams once or twice a year. Routine eye exams. Ask your health care provider how often you should have your eyes checked. Personal lifestyle choices, including: Daily care of your teeth and gums. Regular physical activity. Eating a healthy diet. Avoiding tobacco and drug use. Limiting alcohol use. Practicing safe sex. Taking low doses of aspirin every day. Taking vitamin and mineral supplements as recommended by your health care provider. What happens during an annual well check? The services and screenings done by your health care provider during your annual well check will depend on your age, overall health, lifestyle risk factors, and family history of disease. Counseling  Your health care provider may ask you questions about your: Alcohol use. Tobacco use. Drug use. Emotional well-being. Home and relationship well-being. Sexual activity. Eating habits. History of  falls. Memory and ability to understand (cognition). Work and work Statistician. Screening  You may have the following tests or measurements: Height, weight, and BMI. Blood pressure. Lipid and cholesterol levels. These may be checked every 5 years, or more frequently if you are over 51 years old. Skin check. Lung cancer screening. You may have this screening every year starting at age 75 if you have a 30-pack-year history of smoking and currently smoke or have quit within the past 15 years. Fecal occult blood test (FOBT) of the stool. You may have this test every year starting at age 31. Flexible sigmoidoscopy or colonoscopy. You may have a sigmoidoscopy every 5 years or a colonoscopy every 10 years starting at age 73. Prostate cancer screening. Recommendations will vary depending on your family history and other risks. Hepatitis C blood test. Hepatitis B blood test. Sexually transmitted disease (STD) testing. Diabetes screening. This is done by checking your blood sugar (glucose) after you have not eaten for a while (fasting). You may have this done every 1-3 years. Abdominal aortic aneurysm (AAA) screening. You may need this if you are a current or former smoker. Osteoporosis. You may be screened starting at age 45 if you are at high risk. Talk with your health care provider about your test results, treatment options, and if necessary, the need for more tests. Vaccines  Your health care provider may recommend certain vaccines, such as: Influenza vaccine. This is recommended every year. Tetanus, diphtheria, and acellular pertussis (Tdap, Td) vaccine. You may need a Td booster every 10 years. Zoster vaccine. You may need this after age 44. Pneumococcal 13-valent conjugate (PCV13) vaccine. One dose is recommended after age 58. Pneumococcal polysaccharide (PPSV23) vaccine. One dose is recommended after age 12. Talk to your health care  provider about which screenings and vaccines you need and  how often you need them. This information is not intended to replace advice given to you by your health care provider. Make sure you discuss any questions you have with your health care provider. Document Released: 09/19/2015 Document Revised: 05/12/2016 Document Reviewed: 06/24/2015 Elsevier Interactive Patient Education  2017 [place] Prevention in the Home Falls can cause injuries. They can happen to people of all ages. There are many things you can do to make your home safe and to help prevent falls. What can I do on the outside of my home? Regularly fix the edges of walkways and driveways and fix any cracks. Remove anything that might make you trip as you walk through a door, such as a raised step or threshold. Trim any bushes or trees on the path to your home. Use bright outdoor lighting. Clear any walking paths of anything that might make someone trip, such as rocks or tools. Regularly check to see if handrails are loose or broken. Make sure that both sides of any steps have handrails. Any raised decks and porches should have guardrails on the edges. Have any leaves, snow, or ice cleared regularly. Use sand or salt on walking paths during winter. Clean up any spills in your garage right away. This includes oil or grease spills. What can I do in the bathroom? Use night lights. Install grab bars by the toilet and in the tub and shower. Do not use towel bars as grab bars. Use non-skid mats or decals in the tub or shower. If you need to sit down in the shower, use a plastic, non-slip stool. Keep the floor dry. Clean up any water that spills on the floor as soon as it happens. Remove soap buildup in the tub or shower regularly. Attach bath mats securely with double-sided non-slip rug tape. Do not have throw rugs and other things on the floor that can make you trip. What can I do in the bedroom? Use night lights. Make sure that you have a light by your bed that is easy to  reach. Do not use any sheets or blankets that are too big for your bed. They should not hang down onto the floor. Have a firm chair that has side arms. You can use this for support while you get dressed. Do not have throw rugs and other things on the floor that can make you trip. What can I do in the kitchen? Clean up any spills right away. Avoid walking on wet floors. Keep items that you use a lot in easy-to-reach places. If you need to reach something above you, use a strong step stool that has a grab bar. Keep electrical cords out of the way. Do not use floor polish or wax that makes floors slippery. If you must use wax, use non-skid floor wax. Do not have throw rugs and other things on the floor that can make you trip. What can I do with my stairs? Do not leave any items on the stairs. Make sure that there are handrails on both sides of the stairs and use them. Fix handrails that are broken or loose. Make sure that handrails are as long as the stairways. Check any carpeting to make sure that it is firmly attached to the stairs. Fix any carpet that is loose or worn. Avoid having throw rugs at the top or bottom of the stairs. If you do have throw rugs, attach them to the  floor with carpet tape. Make sure that you have a light switch at the top of the stairs and the bottom of the stairs. If you do not have them, ask someone to add them for you. What else can I do to help prevent falls? Wear shoes that: Do not have high heels. Have rubber bottoms. Are comfortable and fit you well. Are closed at the toe. Do not wear sandals. If you use a stepladder: Make sure that it is fully opened. Do not climb a closed stepladder. Make sure that both sides of the stepladder are locked into place. Ask someone to hold it for you, if possible. Clearly mark and make sure that you can see: Any grab bars or handrails. First and last steps. Where the edge of each step is. Use tools that help you move  around (mobility aids) if they are needed. These include: Canes. Walkers. Scooters. Crutches. Turn on the lights when you go into a dark area. Replace any light bulbs as soon as they burn out. Set up your furniture so you have a clear path. Avoid moving your furniture around. If any of your floors are uneven, fix them. If there are any pets around you, be aware of where they are. Review your medicines with your doctor. Some medicines can make you feel dizzy. This can increase your chance of falling. Ask your doctor what other things that you can do to help prevent falls. This information is not intended to replace advice given to you by your health care provider. Make sure you discuss any questions you have with your health care provider. Document Released: 06/19/2009 Document Revised: 01/29/2016 Document Reviewed: 09/27/2014 Elsevier Interactive Patient Education  2017 [employer].

## 2021-10-30 NOTE — Progress Notes (Signed)
Gastroenterology Pre-Procedure Review  Request Date: 12/31/2021 Requesting Physician: Dr. Vicente Males  PATIENT REVIEW QUESTIONS: The patient responded to the following health history questions as indicated:    1. Are you having any GI issues? no 2. Do you have a personal history of Polyps? yes (last colonoscopy) 3. Do you have a family history of Colon Cancer or Polyps? no 4. Diabetes Mellitus? no 5. Joint replacements in the past 12 months?no 6. Major health problems in the past 3 months?no 7. Any artificial heart valves, MVP, or defibrillator?no    MEDICATIONS & ALLERGIES:    Patient reports the following regarding taking any anticoagulation/antiplatelet therapy:   Plavix, Coumadin, Eliquis, Xarelto, Lovenox, Pradaxa, Brilinta, or Effient? yes (asprin 325) Aspirin? yes (81 mg)  Patient confirms/reports the following medications:  Current Outpatient Medications  Medication Sig Dispense Refill   albuterol (VENTOLIN HFA) 108 (90 Base) MCG/ACT inhaler INHALE 1 TO 2 PUFFS EVERY 4 HOURS AS NEEDED FOR WHEEZING OR SHORTNESS OF BREATH 1 each 2   aspirin 81 MG chewable tablet      aspirin EC 325 MG EC tablet Take 1 tablet (325 mg total) by mouth daily. 90 tablet 0   baclofen (LIORESAL) 10 MG tablet Take 1 tablet (10 mg total) by mouth 3 (three) times daily. 30 each 0   cholecalciferol (VITAMIN D3) 25 MCG (1000 UNIT) tablet Take 1,000 Units by mouth daily.     fludrocortisone (FLORINEF) 0.1 MG tablet TAKE 1 TABLET (0.1 MG TOTAL) BY MOUTH DAILY. (Patient taking differently: Take 0.1 mg by mouth as needed.) 90 tablet 2   rosuvastatin (CRESTOR) 40 MG tablet TAKE 1 TABLET AT BEDTIME 90 tablet 2   sildenafil (REVATIO) 20 MG tablet TAKE 1 TO 5 TABLETS BY MOUTH ABOUT 30 MINUTES PRIOR TO SEX.START WITH 1 THEN INCREASE 30 tablet 5   Tiotropium Bromide-Olodaterol (STIOLTO RESPIMAT) 2.5-2.5 MCG/ACT AERS Inhale 2 puffs into the lungs daily. 12 g 3   vitamin B-12 (CYANOCOBALAMIN) 1000 MCG tablet Take 1 tablet  (1,000 mcg total) by mouth daily.     vitamin C (ASCORBIC ACID) 500 MG tablet Take 500 mg by mouth daily. Am     No current facility-administered medications for this visit.    Patient confirms/reports the following allergies:  No Known Allergies  No orders of the defined types were placed in this encounter.   AUTHORIZATION INFORMATION Primary Insurance: 1D#: Group #:  Secondary Insurance: 1D#: Group #:  SCHEDULE INFORMATION: Date: 12/31/2021 Time: Location[place]

## 2021-11-03 ENCOUNTER — Other Ambulatory Visit: Payer: Self-pay

## 2021-11-03 ENCOUNTER — Ambulatory Visit (INDEPENDENT_AMBULATORY_CARE_PROVIDER_SITE_OTHER): Payer: Medicare HMO | Admitting: Family Medicine

## 2021-11-03 ENCOUNTER — Other Ambulatory Visit: Payer: Self-pay | Admitting: Family Medicine

## 2021-11-03 VITALS — BP 131/87 | HR 93 | Ht 72.0 in | Wt 169.8 lb

## 2021-11-03 DIAGNOSIS — I6930 Unspecified sequelae of cerebral infarction: Secondary | ICD-10-CM | POA: Diagnosis not present

## 2021-11-03 DIAGNOSIS — M4802 Spinal stenosis, cervical region: Secondary | ICD-10-CM | POA: Diagnosis not present

## 2021-11-03 DIAGNOSIS — J432 Centrilobular emphysema: Secondary | ICD-10-CM

## 2021-11-03 DIAGNOSIS — E782 Mixed hyperlipidemia: Secondary | ICD-10-CM

## 2021-11-03 DIAGNOSIS — R351 Nocturia: Secondary | ICD-10-CM

## 2021-11-03 DIAGNOSIS — I69359 Hemiplegia and hemiparesis following cerebral infarction affecting unspecified side: Secondary | ICD-10-CM

## 2021-11-03 DIAGNOSIS — E559 Vitamin D deficiency, unspecified: Secondary | ICD-10-CM

## 2021-11-03 DIAGNOSIS — R053 Chronic cough: Secondary | ICD-10-CM

## 2021-11-03 DIAGNOSIS — R7309 Other abnormal glucose: Secondary | ICD-10-CM

## 2021-11-03 DIAGNOSIS — Z0000 Encounter for general adult medical examination without abnormal findings: Secondary | ICD-10-CM

## 2021-11-03 MED ORDER — PREDNISONE 20 MG PO TABS: ORAL_TABLET | ORAL | 0 refills | Status: DC

## 2021-11-03 NOTE — Progress Notes (Signed)
Subjective:    [name], male    DOB: 06/18/1956, 66 y.o.   MRN: [id number]  Ethan Fitzgerald is a 66 y.o. male presenting on 11/03/2021 for COPD   HPI  C-spine Stenosis Neurology follow-up January 2023, had C-Spine MRI completed, see below There was significant Spinal stenosis and DJD of C spine. He was referred back to Neurosurgery Also on Baclofen from Neurosurgery  Post-CVA, residual deficits He admits still difficulty with balance at times, but working on it. He has difficulty with Left sided residual weakness still gradually working on this.  Chronic Cough Centrilobular Emphysema Reports 4-5 weeks with productive cough, his wife got sick initial for 1 month then hers cleared up, and he got sick. Denies post nasal drainage from sinus  Depression screen [facility] 2/9 11/03/2021 10/30/2021 07/10/2021  Decreased Interest 0 0 0  Down, Depressed, Hopeless 0 0 0  PHQ - 2 Score 0 0 0  Altered sleeping 0 - 0  Tired, decreased energy 0 - 0  Change in appetite 0 - 0  Feeling bad or failure about yourself  0 - 0  Trouble concentrating 0 - 0  Moving slowly or fidgety/restless 0 - 0  Suicidal thoughts 0 - 0  PHQ-9 Score 0 - 0  Difficult doing work/chores Not difficult at all - Not difficult at all    Social History   Tobacco Use   Smoking status: Former    Packs/day: 1.50    Years: 49.00    Pack years: 73.50    Types: Cigarettes    Start date: 1970    Quit date: 09/14/2017    Years since quitting: 4.1   Smokeless tobacco: Former  Scientific laboratory technician Use: Never used  Substance Use Topics   Alcohol use: Yes    Comment: 6 pack on weekend   Drug use: No    Review of Systems Per HPI unless specifically indicated above     Objective:    BP 131/87    Pulse 93    Ht 6' (1.829 m)    Wt 169 lb 12.8 oz (77 kg)    SpO2 98%    BMI 23.03 kg/m   Wt Readings from Last 3 Encounters:  11/03/21 169 lb 12.8 oz (77 kg)  10/30/21 170 lb (77.1 kg)  09/17/21 173 lb (78.5  kg)    Physical Exam Vitals and nursing note reviewed.  Constitutional:      General: He is not in acute distress.    Appearance: He is well-developed. He is not diaphoretic.     Comments: Well-appearing, comfortable, cooperative  HENT:     Head: Normocephalic and atraumatic.  Eyes:     General:        Right eye: No discharge.        Left eye: No discharge.     Conjunctiva/sclera: Conjunctivae normal.  Neck:     Thyroid: No thyromegaly.  Cardiovascular:     Rate and Rhythm: Normal rate and regular rhythm.     Pulses: Normal pulses.     Heart sounds: Normal heart sounds. No murmur heard. Pulmonary:     Effort: Pulmonary effort is normal. No respiratory distress.     Breath sounds: Wheezing present. No rales.  Musculoskeletal:        General: Normal range of motion.     Cervical back: Normal range of motion and neck supple.  Lymphadenopathy:     Cervical: No cervical adenopathy.  Skin:    General: Skin is warm and dry.     Findings: No erythema or rash.  Neurological:     Mental Status: He is alert and oriented to person, place, and time. Mental status is at baseline.  Psychiatric:        Behavior: Behavior normal.     Comments: Well groomed, good eye contact, normal speech and thoughts    I have personally reviewed the radiology report from 09/28/21 C Spine MRI.  CLINICAL DATA:  Neck pain with left hand numbness, weakness, and imbalance/gait impairment.   EXAM: MRI CERVICAL SPINE WITHOUT CONTRAST   TECHNIQUE: Multiplanar, multisequence MR imaging of the cervical spine was performed. No intravenous contrast was administered.   COMPARISON:  Head and neck CTA 08/18/2021   FINDINGS: Alignment: Unchanged trace anterolisthesis of C4 on C5.   Vertebrae: No fracture or suspicious marrow lesion. Moderate degenerative endplate edema at Q9-8. Right facet edema at C4-5.   Cord: Normal signal.   Posterior Fossa, vertebral arteries, paraspinal tissues: Unremarkable  included posterior fossa. Preserved vertebral artery flow voids. Edema in the soft tissues adjacent to the right C4-5 facet joint.   Disc levels:   C2-3: Mild facet arthrosis without stenosis.   C3-4: Disc bulging, right uncovertebral spurring, and severe right facet arthrosis result in severe right neural foraminal stenosis and borderline spinal stenosis.   C4-5: Anterolisthesis with bulging uncovered disc, uncovertebral spurring, and severe right and mild left facet arthrosis result in mild spinal stenosis and moderate right and moderate to severe left neural foraminal stenosis.   C5-6: Moderate disc space narrowing. Left eccentric disc bulging, uncovertebral spurring, and mild facet arthrosis result in mild spinal stenosis and mild-to-moderate right and severe left neural foraminal stenosis.   C6-7: Severe disc space narrowing. Broad-based posterior disc osteophyte complex results in mild spinal stenosis with mild cord flattening and severe right and moderate to severe left neural foraminal stenosis.   C7-T1: Moderate facet arthrosis without stenosis.   IMPRESSION: 1. Multilevel cervical disc and facet degeneration, most notable at C6-7 where there is mild spinal stenosis and severe right greater than left neural foraminal stenosis. 2. Mild spinal stenosis and moderate to severe neural foraminal stenosis at C4-5. 3. Severe right facet arthrosis with edema at C4-5. 4. Severe neural foraminal stenosis on the right at C3-4 and on the left at C5-6.     Electronically Signed   By: Logan Bores M.D.   On: 09/28/2021 13:03    Results for orders placed or performed during the hospital encounter of 08/18/21  I-STAT creatinine  Result Value Ref Range   Creatinine, Ser 1.20 0.61 - 1.24 mg/dL      Assessment & Plan:   Problem List Items Addressed This Visit     History of cerebrovascular accident (CVA) with residual deficit   Hemiplegia of dominant side following  cerebrovascular accident (CVA) ([place])   Degenerative cervical spinal stenosis   Centrilobular emphysema ([place]) - Primary   Relevant Medications   predniSONE (DELTASONE) 20 MG tablet   Other Visit Diagnoses     Chronic cough       Relevant Medications   predniSONE (DELTASONE) 20 MG tablet       Chronic Cough Possible COPD flare / exacerbation Post viral cough Will treat with Prednisone taper over 7 days Use Albuterol PRN Not having sinus symptoms, can hold nasal steroid  C Spinal Stenosis / DJD Followed by Neurology > now neurosurgery returned to [facility] [place],  request copy of last record and reviewed MRI.  History CVA / Hemiplegia, residual deficit Hemiplegia L side, stable Has improved but remains with deficit Working with Neurology   Meds ordered this encounter  Medications   predniSONE (DELTASONE) 20 MG tablet    Sig: Take daily with food. Start with 60mg  (3 pills) x 2 days, then reduce to 40mg  (2 pills) x 2 days, then 20mg  (1 pill) x 3 days    Dispense:  13 tablet    Refill:  0    Follow up plan: Return in about 7 weeks (around 12/22/2021) for 7 week fasting lab only 1st in AM return 1 week later Annual Physical (1st in AM).  Future labs ordered for 12/22/21   [name], [facility] 11/03/2021, 8:43 AM

## 2021-11-03 NOTE — Patient Instructions (Addendum)
Thank you for coming to the office today.  Okay to take baclofen  Avoid Naproxen, Ibuprofen Advil aleve - while on the Prednisone steroid.  Cough should improve and neck / pain nerve pinching can improve temporary  We will request a copy of record from Dr Ronnald Ramp  DUE for [place] (no food or drink after midnight before the lab appointment, only water or coffee without cream/sugar on the morning of)  SCHEDULE "Lab Only" visit in the morning at the clinic for lab draw in 7 WEEKS   - Make sure Lab Only appointment is at about 1 week before your next appointment, so that results will be available  For Lab Results, once available within 2-3 days of blood draw, you can can log in to MyChart online to view your results and a brief explanation. Also, we can discuss results at next follow-up visit.   Please schedule a Follow-up Appointment to: Return in about 7 weeks (around 12/22/2021) for 7 week fasting lab only 1st in AM return 1 week later Annual Physical (1st in AM).  If you have any other questions or concerns, please feel free to call the office or send a message through [place]. You may also schedule an earlier appointment if necessary.  Additionally, you may be receiving a survey about your experience at our office within a few days to 1 week by e-mail or mail. We value your feedback.  [name], DO [place]

## 2021-11-10 NOTE — Telephone Encounter (Signed)
Open in error

## 2021-12-01 ENCOUNTER — Institutional Professional Consult (permissible substitution): Payer: Medicare HMO | Admitting: Neurology

## 2021-12-10 NOTE — Progress Notes (Signed)
RESENT BLOODTHINNER  ?

## 2021-12-22 ENCOUNTER — Other Ambulatory Visit: Payer: Medicare HMO

## 2021-12-23 ENCOUNTER — Other Ambulatory Visit: Payer: Medicare HMO

## 2021-12-23 DIAGNOSIS — R351 Nocturia: Secondary | ICD-10-CM | POA: Diagnosis not present

## 2021-12-23 DIAGNOSIS — E559 Vitamin D deficiency, unspecified: Secondary | ICD-10-CM | POA: Diagnosis not present

## 2021-12-23 DIAGNOSIS — R7309 Other abnormal glucose: Secondary | ICD-10-CM | POA: Diagnosis not present

## 2021-12-23 DIAGNOSIS — J432 Centrilobular emphysema: Secondary | ICD-10-CM | POA: Diagnosis not present

## 2021-12-23 DIAGNOSIS — Z0000 Encounter for general adult medical examination without abnormal findings: Secondary | ICD-10-CM

## 2021-12-23 DIAGNOSIS — E782 Mixed hyperlipidemia: Secondary | ICD-10-CM

## 2021-12-24 LAB — COMPLETE METABOLIC PANEL WITH GFR
AG Ratio: 1.8 (calc) (ref 1.0–2.5)
ALT: 18 U/L (ref 9–46)
AST: 19 U/L (ref 10–35)
Albumin: 4 g/dL (ref 3.6–5.1)
Alkaline phosphatase (APISO): 56 U/L (ref 35–144)
BUN: 16 mg/dL (ref 7–25)
CO2: 25 mmol/L (ref 20–32)
Calcium: 9.6 mg/dL (ref 8.6–10.3)
Chloride: 109 mmol/L (ref 98–110)
Creat: 1.08 mg/dL (ref 0.70–1.35)
Globulin: 2.2 g/dL (calc) (ref 1.9–3.7)
Glucose, Bld: 111 mg/dL — ABNORMAL HIGH (ref 65–99)
Potassium: 3.9 mmol/L (ref 3.5–5.3)
Sodium: 140 mmol/L (ref 135–146)
Total Bilirubin: 1.1 mg/dL (ref 0.2–1.2)
Total Protein: 6.2 g/dL (ref 6.1–8.1)
eGFR: 75 mL/min/{1.73_m2} (ref >=60)

## 2021-12-24 LAB — CBC WITH DIFFERENTIAL/PLATELET
Absolute Monocytes: 585 cells/uL (ref 200–950)
Basophils Absolute: 53 cells/uL (ref 0–200)
Basophils Relative: 0.7 %
Eosinophils Absolute: 428 cells/uL (ref 15–500)
Eosinophils Relative: 5.7 %
HCT: 47.9 % (ref 38.5–50.0)
Hemoglobin: 16 g/dL (ref 13.2–17.1)
Lymphs Abs: 2475 cells/uL (ref 850–3900)
MCH: 33.1 pg — ABNORMAL HIGH (ref 27.0–33.0)
MCHC: 33.4 g/dL (ref 32.0–36.0)
MCV: 99.2 fL (ref 80.0–100.0)
MPV: 12.5 fL (ref 7.5–12.5)
Monocytes Relative: 7.8 %
Neutro Abs: 3960 cells/uL (ref 1500–7800)
Neutrophils Relative %: 52.8 %
Platelets: 174 10*3/uL (ref 140–400)
RBC: 4.83 10*6/uL (ref 4.20–5.80)
RDW: 13.2 % (ref 11.0–15.0)
Total Lymphocyte: 33 %
WBC: 7.5 10*3/uL (ref 3.8–10.8)

## 2021-12-24 LAB — LIPID PANEL
Cholesterol: 115 mg/dL (ref <200)
HDL: 45 mg/dL (ref >=40)
LDL Cholesterol (Calc): 44 mg/dL (calc)
Non-HDL Cholesterol (Calc): 70 mg/dL (calc) (ref <130)
Total CHOL/HDL Ratio: 2.6 (calc) (ref <5.0)
Triglycerides: 185 mg/dL — ABNORMAL HIGH (ref <150)

## 2021-12-24 LAB — HEMOGLOBIN A1C
Hgb A1c MFr Bld: 5.7 % of total Hgb — ABNORMAL HIGH (ref <5.7)
Mean Plasma Glucose: 117 mg/dL
eAG (mmol/L): 6.5 mmol/L

## 2021-12-24 LAB — TSH: TSH: 1.9 mIU/L (ref 0.40–4.50)

## 2021-12-24 LAB — PSA: PSA: 0.65 ng/mL (ref <=4.00)

## 2021-12-24 LAB — VITAMIN D 25 HYDROXY (VIT D DEFICIENCY, FRACTURES): Vit D, 25-Hydroxy: 46 ng/mL (ref 30–100)

## 2021-12-28 ENCOUNTER — Encounter: Payer: Self-pay | Admitting: Gastroenterology

## 2021-12-28 ENCOUNTER — Telehealth: Payer: Self-pay

## 2021-12-28 NOTE — Telephone Encounter (Signed)
LVM for patient to call office back to inform him we will need to cancel his colonoscopy due to Dr. Neoma Laming has informed us that he can not stop Aspirin '325mg'$   due to medical necessity (history of stroke). ? ?Thanks, ?Sharyn Lull, CMA ?

## 2021-12-30 ENCOUNTER — Telehealth: Payer: Self-pay

## 2021-12-30 ENCOUNTER — Other Ambulatory Visit: Payer: Self-pay

## 2021-12-30 DIAGNOSIS — Z86010 Personal history of colonic polyps: Secondary | ICD-10-CM

## 2021-12-30 NOTE — Telephone Encounter (Signed)
Discussion today with patient's specialists including Gastroenterology [facility] and Neurology. ? ?It was my original opinion to not hold Aspirin '325mg'$  daily (2ndary prevention for stroke) prior to colonoscopy that caused his procedure to become cancelled. ? ?It has been about 2 years since his stroke. ? ?I spoke with his Neurologist [name] NP via secure chat Epic message today and she recommended it would be okay to temporarily hold aspirin due to now 2 years out from stroke. ? ?Recommendation is now for hold aspirin '325mg'$  for 3 days prior to colonoscopy procedure and restart after procedure once stable later that day. ? ?Of note, his colonoscopy was re-schedule and then he has cancelled at this time due to other issues. But can re-schedule in future. ? ?Will re-complete the procedure form and fax back to [facility] when I receive it. ? ?[name], DO ?[facility] ?[facility] ?12/30/2021, 3:33 PM ?  ?

## 2021-12-30 NOTE — Telephone Encounter (Signed)
Copied from [place] [id number]. Topic: General - Inquiry ?>> Dec 30, 2021  8:58 AM Loma [place] wrote: ?See below message, Hospital called and cancelled Colonoscopy with Aspirin regime. Pls fu with Ethan Fitzgerald, wife @ [phone] as wants to know he will not be having procedure at anytime? Husband is always working so pls contact Ethan Fitzgerald and she will relay. ?Patient Message ?Open  ?12/28/2021 ?[place] ?Ethan Fitzgerald, CMA ? ?Conversation: Unable to Stop Aspirin '325mg'$  for Colonoscopy 12/31/21 ?(Newest Message First) ?December 30, 2021 ?Ethan Fitzgerald ?to Ethan Fitzgerald, CMA   ?? ?8:52 AM ?Iam not understanding this message ,I have to take the aspirin for  the rest of my life. so are you telling me there will be no surgery tomorrow  the 27th of april  ?December 28, 2021 ?Ethan Fitzgerald, CMA ?to Ethan Fitzgerald   ?? ?4:15 PM ?Good afternoon Ethan Fitzgerald, ?? ?I've received blood thinner advice from Dr. Parks Ranger in regard to your Aspirin '325mg'$ .  He has informed us that your Aspirin '325mg'$  cannot be stopped for your colonoscopy due to history of stroke.  We will need to cancel your colonoscopy for this reason, until you are able to stop Aspirin '325mg'$ .  If you have any questions regarding Dr. Parks Ranger advice you may contact his office. ?? ?Thank you, ?? Ethan Fitzgerald, CMA ? ?Last read by Ethan Fitzgerald at ?8:48 AM on 12/30/2021. ?This encounter is not signed. The conversation may still be ongoing. ?

## 2021-12-30 NOTE — Telephone Encounter (Signed)
Patient has requested to cancel his colonoscopy at this time due to his wife is having surgery next week.  He will call back to reschedule. ? ?Thanks, ?Sharyn Lull, CMA ?

## 2021-12-30 NOTE — Telephone Encounter (Signed)
Contacted patient to discuss mychart message received. I explained to him that unfortunately his aspirin regimen can not be interrupted for [place] to perform his colonoscopy per Dr. Parks Ranger.  Patient verbalized understanding and said he is just glad he did not start his prep.  I advised that he could inquire to Dr. Parks Ranger as to when he thinks he would be able to have his colonoscopy.  He said he will do as Dr. Parks Ranger advises and not have the colonoscopy at this time. ? ?Thanks, ?Sharyn Lull, CMA ?

## 2021-12-31 ENCOUNTER — Encounter: Admission: RE | Payer: Self-pay | Source: Home / Self Care

## 2021-12-31 ENCOUNTER — Ambulatory Visit: Admission: RE | Admit: 2021-12-31 | Payer: Medicare HMO | Source: Home / Self Care | Admitting: Gastroenterology

## 2021-12-31 SURGERY — COLONOSCOPY WITH PROPOFOL
Anesthesia: General

## 2022-01-07 ENCOUNTER — Ambulatory Visit: Admit: 2022-01-07 | Payer: Medicare HMO | Admitting: Gastroenterology

## 2022-01-07 SURGERY — COLONOSCOPY WITH PROPOFOL
Anesthesia: General

## 2022-01-12 NOTE — Telephone Encounter (Signed)
Attempted to schedule.  LMOV to call office.  ° °

## 2022-01-14 ENCOUNTER — Ambulatory Visit: Payer: Medicare HMO | Admitting: Neurology

## 2022-01-14 ENCOUNTER — Encounter: Payer: Self-pay | Admitting: Neurology

## 2022-01-14 DIAGNOSIS — I6930 Unspecified sequelae of cerebral infarction: Secondary | ICD-10-CM

## 2022-01-14 DIAGNOSIS — R0683 Snoring: Secondary | ICD-10-CM | POA: Insufficient documentation

## 2022-01-14 DIAGNOSIS — J439 Emphysema, unspecified: Secondary | ICD-10-CM

## 2022-01-14 DIAGNOSIS — I69359 Hemiplegia and hemiparesis following cerebral infarction affecting unspecified side: Secondary | ICD-10-CM | POA: Diagnosis not present

## 2022-01-14 NOTE — Progress Notes (Signed)
? ? ?SLEEP MEDICINE CLINIC ?  ? ?Provider:  Larey Seat, MD  ?Primary Care Physician:  Olin Hauser, DO ?[address] [place] [address]  ? ?  ?Referring Provider: Leonie Man, MD  ?  ?  ?    ?Chief Complaint according to patient   ?Patient presents with:  ?  ? New Patient (Initial Visit)  ?     ?  ?  ?HISTORY OF PRESENT ILLNESS:  ?Ethan Fitzgerald is a 66 y.o. Caucasian male patient seen here as a referral on 01/14/2022 from Stroke team, Dr Larey Dresser,  for a Sleep evaluation. Marland Kitchen  ?Chief concern according to patient :  "I have no idea why I am here. "  Ethan Fitzgerald  wrote : " Greatest concern from stroke standpoint is possible underlying sleep apnea.  Discussed sleep apnea at length as well as potential treatment options.  He would like to further discuss with his wife and call office if he wishes to proceed with sleep evaluation.  Also encouraged him to follow-up with PCP to evaluate for other underlying causes such as thyroid or vitamin deficiency" ?  ?I have the pleasure of seeing Ethan Fitzgerald today, a left-handed White or Caucasian male who  has a past medical history of COPD (chronic obstructive pulmonary disease) ([place]), COVID-19 (09/2019),  ?Erectile dysfunction, Headache, Knee pain, left, Seasonal allergies, Stroke ([place]), and Tobacco dependence. ?  ?Sleep relevant medical history: Nocturia 1-2, Sleep talking, no ENT surgery.   ?Family medical /sleep history: no other family member on CPAP with OSA, insomnia, sleep walkers.  ? 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  ?Family status is married, with no biological children.  ?The patient currently works daytime -he is often on call.  ?Tobacco use: quit .  ETOH use; beer on WE - 4 drinks/ a week, ? Caffeine intake in form of Coffee( 12 ounces in AM ) Soda( 1 at lunch every day) Tea ( /) or energy drinks. ?Regular exercise in form of physical labor .   ? ?Sleep habits are as follows:  The patient's dinner time is between 6 PM. The patient goes to bed at 11 PM and continues to sleep for 6-7 hours, wakes for 1 bathroom breaks,.   ?The preferred sleep position is left side but since the stroke- supine , with the support of 1-2 pillows. Dreams are reportedly frequent.  ?5-6 -AM is the usual rise time. The patient wakes up spontaneously.  ?He reports not feeling refreshed or restored in AM,  Naps are not taken.  ?Review of Systems: ?Out of a complete 14 system review, the patient complains of only the following symptoms, and all other reviewed systems are negative.:  ?Fatigue, sleepiness , snoring per wife- apnea not witnessed. ,  ?  ?How likely are you to doze in the following situations: ?0 = not likely, 1 = slight chance, 2 = moderate chance, 3 = high chance ?  ?Sitting and Reading? ?Watching Television? ?Sitting inactive in a public place (theater or meeting)? ?As a passenger in a car for an hour without a break? ?Lying down in the afternoon when circumstances permit? ?Sitting and talking to someone? ?Sitting quietly after lunch without alcohol? ?In a car, while stopped for a few minutes in traffic? ?  ?Total = 6/ 24 points  ? FSS endorsed at 20/ 63 points.  ?GDS at 5/ 15 points. ? ?Social History  ? ?  Socioeconomic History  ? Marital status: Married  ?  Spouse name: Ethan Fitzgerald  ? Number of children: 0  ? Years of education: Not on file  ? Highest education level: Some college, no degree  ?Occupational History  ? Not on file  ?Tobacco Use  ? Smoking status: Former  ?  Packs/day: 1.50  ?  Years: 49.00  ?  Pack years: 73.50  ?  Types: Cigarettes  ?  Start date: 1970  ?  Quit date: 09/14/2017  ?  Years since quitting: 4.3  ? Smokeless tobacco: Former  ?Vaping Use  ? Vaping Use: Never used  ?Substance and Sexual Activity  ? Alcohol use: Yes  ?  Comment: 6 pack on weekend  ? Drug use: No  ? Sexual activity: Not on file  ?Other Topics Concern  ? Not on file  ?Social History Narrative  ? Lives at home w wife  ? L  handed  ? Caffeine: 1 C of coffee AM  ? ?Social Determinants of Health  ? ?Financial Resource Strain: Low Risk   ? Difficulty of Paying Living Expenses: Not hard at all  ?Food Insecurity: No Food Insecurity  ? Worried About Charity fundraiser in the Last Year: Never true  ? Ran Out of Food in the Last Year: Never true  ?Transportation Needs: No Transportation Needs  ? Lack of Transportation (Medical): No  ? Lack of Transportation (Non-Medical): No  ?Physical Activity: Inactive  ? Days of Exercise per Week: 0 days  ? Minutes of Exercise per Session: 0 min  ?Stress: No Stress Concern Present  ? Feeling of Stress : Not at all  ?Social Connections: Moderately Isolated  ? Frequency of Communication with Friends and Family: More than three times a week  ? Frequency of Social Gatherings with Friends and Family: Once a week  ? Attends Religious Services: Never  ? Active Member of Clubs or Organizations: No  ? Attends Archivist Meetings: Never  ? Marital Status: Married  ? ? ?Family History  ?Problem Relation Age of Onset  ? Diabetes Mother   ? Heart attack Mother 66  ? Cancer Father   ?     liver   ? COPD Brother   ? HIV/AIDS Brother   ? Prostate cancer Neg Hx   ? Colon cancer Neg Hx   ? ? ?Past Medical History:  ?Diagnosis Date  ? COPD (chronic obstructive pulmonary disease) ([place])   ? mild  ? COVID-19 09/2019  ? Erectile dysfunction   ? Headache   ? daily, stress headaches  ? Knee pain, left   ? Seasonal allergies   ? Stroke [facility])   ? Tobacco dependence   ? ? ?Past Surgical History:  ?Procedure Laterality Date  ? BACK SURGERY    ? metal plate in back  ? COLONOSCOPY    ? COLONOSCOPY WITH PROPOFOL N/A 04/26/2019  ? Procedure: COLONOSCOPY WITH PROPOFOL;  Surgeon: Jonathon Bellows, MD;  Location[facility] ENDOSCOPY;  Service: Gastroenterology;  Laterality: N/A;  ? HERNIA REPAIR Right   ? IR ANGIO INTRA EXTRACRAN SEL COM CAROTID INNOMINATE UNI L MOD SED  03/27/2020  ? IR ANGIO VERTEBRAL SEL SUBCLAVIAN INNOMINATE UNI L MOD SED   03/27/2020  ? IR ANGIO VERTEBRAL SEL VERTEBRAL UNI R MOD SED  03/27/2020  ? IR CT HEAD LTD  03/27/2020  ? IR PERCUTANEOUS ART THROMBECTOMY/INFUSION INTRACRANIAL INC DIAG ANGIO  03/27/2020  ? KNEE ARTHROSCOPY Left 03/28/2015  ? Procedure: Arthroscopic partial medial  meniscectomy plus chondral debridement;  Surgeon: Leanor Kail, MD;  Location: [place];  Service: Orthopedics;  Laterality: Left;  ? RADIOLOGY WITH ANESTHESIA N/A 03/27/2020  ? Procedure: IR WITH ANESTHESIA;  Surgeon: Luanne Bras, MD;  Location: [place];  Service: Radiology;  Laterality: N/A;  ?  ? ?Current Outpatient Medications on File Prior to Visit  ?Medication Sig Dispense Refill  ? albuterol (VENTOLIN HFA) 108 (90 Base) MCG/ACT inhaler INHALE 1 TO 2 PUFFS EVERY 4 HOURS AS NEEDED FOR WHEEZING OR SHORTNESS OF BREATH 1 each 2  ? aspirin 81 MG chewable tablet     ? aspirin EC 325 MG EC tablet Take 1 tablet (325 mg total) by mouth daily. 90 tablet 0  ? baclofen (LIORESAL) 10 MG tablet Take 1 tablet (10 mg total) by mouth 3 (three) times daily. 90 each 3  ? cholecalciferol (VITAMIN D3) 25 MCG (1000 UNIT) tablet Take 1,000 Units by mouth daily.    ? fludrocortisone (FLORINEF) 0.1 MG tablet TAKE 1 TABLET (0.1 MG TOTAL) BY MOUTH DAILY. (Patient taking differently: Take 0.1 mg by mouth as needed.) 90 tablet 2  ? naproxen (NAPROSYN) 500 MG tablet Take 1 tablet (500 mg total) by mouth 2 (two) times daily as needed for mild pain or moderate pain. 60 tablet 2  ? predniSONE (DELTASONE) 20 MG tablet Take daily with food. Start with '60mg'$  (3 pills) x 2 days, then reduce to '40mg'$  (2 pills) x 2 days, then '20mg'$  (1 pill) x 3 days 13 tablet 0  ? rosuvastatin (CRESTOR) 40 MG tablet TAKE 1 TABLET AT BEDTIME 90 tablet 2  ? sildenafil (REVATIO) 20 MG tablet TAKE 1 TO 5 TABLETS BY MOUTH ABOUT 30 MINUTES PRIOR TO SEX.START WITH 1 THEN INCREASE 30 tablet 5  ? Tiotropium Bromide-Olodaterol (STIOLTO RESPIMAT) 2.5-2.5 MCG/ACT AERS Inhale 2 puffs into the lungs daily.  12 g 3  ? vitamin B-12 (CYANOCOBALAMIN) 1000 MCG tablet Take 1 tablet (1,000 mcg total) by mouth daily.    ? vitamin C (ASCORBIC ACID) 500 MG tablet Take 500 mg by mouth daily. Am    ? ?No current fac

## 2022-01-25 ENCOUNTER — Other Ambulatory Visit: Payer: Self-pay | Admitting: Family Medicine

## 2022-01-25 DIAGNOSIS — G8929 Other chronic pain: Secondary | ICD-10-CM

## 2022-01-26 NOTE — Telephone Encounter (Signed)
Requested Prescriptions  Pending Prescriptions Disp Refills  . naproxen (NAPROSYN) 500 MG tablet [Pharmacy Med Name: NAPROXEN 500 MG TABLET] 60 tablet 2    Sig: TAKE 1 TABLET (500 MG TOTAL) BY MOUTH 2 (TWO) TIMES DAILY AS NEEDED FOR MILD PAIN OR MODERATE PAIN.     Analgesics:  NSAIDS Failed - 01/25/2022  2:19 AM      Failed - Manual Review: Labs are only required if the patient has taken medication for more than 8 weeks.      Passed - Cr in normal range and within 360 days    Creat  Date Value Ref Range Status  12/23/2021 1.08 0.70 - 1.35 mg/dL Final         Passed - HGB in normal range and within 360 days    Hemoglobin  Date Value Ref Range Status  12/23/2021 16.0 13.2 - 17.1 g/dL Final         Passed - PLT in normal range and within 360 days    Platelets  Date Value Ref Range Status  12/23/2021 174 140 - 400 Thousand/uL Final         Passed - HCT in normal range and within 360 days    HCT  Date Value Ref Range Status  12/23/2021 47.9 38.5 - 50.0 % Final         Passed - eGFR is 30 or above and within 360 days    GFR, Est African American  Date Value Ref Range Status  12/22/2020 85 > OR = 60 mL/min/1.25m Final   GFR, Est Non African American  Date Value Ref Range Status  12/22/2020 74 > OR = 60 mL/min/1.723mFinal   eGFR  Date Value Ref Range Status  12/23/2021 75 > OR = 60 mL/min/1.739minal    Comment:    The eGFR is based on the CKD-EPI 2021 equation. To calculate  the new eGFR from a previous Creatinine or Cystatin C result, go to https://www.kidney.org/professionals/ kdoqi/gfr%5Fcalculator          Passed - Patient is not pregnant      Passed - Valid encounter within last 12 months    Recent Outpatient Visits          2 months ago [facility]   6 months [facility]   1 year ago [facility]   1 year ago B12 deficiency   [facility]   1 year ago History of cerebrovascular accident (CVA) with residual deficit   [facility]

## 2022-02-08 ENCOUNTER — Other Ambulatory Visit: Payer: Self-pay

## 2022-02-08 DIAGNOSIS — G8929 Other chronic pain: Secondary | ICD-10-CM

## 2022-02-08 MED ORDER — BACLOFEN 10 MG PO TABS: 10.0000 mg | ORAL_TABLET | Freq: Three times a day (TID) | ORAL | 3 refills | Status: DC

## 2022-03-10 ENCOUNTER — Other Ambulatory Visit: Payer: Self-pay | Admitting: Family Medicine

## 2022-03-10 DIAGNOSIS — N529 Male erectile dysfunction, unspecified: Secondary | ICD-10-CM

## 2022-03-10 MED ORDER — SILDENAFIL CITRATE 20 MG PO TABS: ORAL_TABLET | ORAL | 5 refills | Status: DC

## 2022-03-17 ENCOUNTER — Telehealth: Payer: Self-pay | Admitting: Neurology

## 2022-03-17 NOTE — Telephone Encounter (Signed)
LVM for pt to call back to schedule  [employer] no auth req spoke to [place] ref # [id number]

## 2022-03-30 NOTE — Telephone Encounter (Signed)
x2 LVM for pt to call back

## 2022-04-06 NOTE — Telephone Encounter (Signed)
Unable to contact .  Closing encounter . Deleting recall.     Attempted to schedule.  LMOV to call office.

## 2022-04-21 ENCOUNTER — Other Ambulatory Visit: Payer: Self-pay | Admitting: Family Medicine

## 2022-04-21 DIAGNOSIS — I6930 Unspecified sequelae of cerebral infarction: Secondary | ICD-10-CM

## 2022-04-21 DIAGNOSIS — E782 Mixed hyperlipidemia: Secondary | ICD-10-CM

## 2022-04-21 NOTE — Telephone Encounter (Signed)
Requested Prescriptions  Pending Prescriptions Disp Refills  . rosuvastatin (CRESTOR) 40 MG tablet [Pharmacy Med Name: ROSUVASTATIN CALCIUM 40 MG Tablet] 90 tablet 0    Sig: TAKE 1 TABLET AT BEDTIME . APPOINTMENT IS NEEDED     Cardiovascular:  Antilipid - Statins 2 Failed - 04/21/2022 10:58 AM      Failed - Lipid Panel in normal range within the last 12 months    Cholesterol  Date Value Ref Range Status  12/23/2021 115 <200 mg/dL Final   LDL Cholesterol (Calc)  Date Value Ref Range Status  12/23/2021 44 mg/dL (calc) Final    Comment:    Reference range: <100 . Desirable range <100 mg/dL for primary prevention;   <70 mg/dL for patients with CHD or diabetic patients  with > or = 2 CHD risk factors. Marland Kitchen LDL-C is now calculated using the Martin-Hopkins  calculation, which is a validated novel method providing  better accuracy than the Friedewald equation in the  estimation of LDL-C.  Cresenciano Genre et al. Annamaria Helling. 9470;962(83): 2061-2068  (http://education.[facility].com/faq/FAQ164)    HDL  Date Value Ref Range Status  12/23/2021 45 > OR = 40 mg/dL Final   Triglycerides  Date Value Ref Range Status  12/23/2021 185 (H) <150 mg/dL Final         Passed - Cr in normal range and within 360 days    Creat  Date Value Ref Range Status  12/23/2021 1.08 0.70 - 1.35 mg/dL Final         Passed - Patient is not pregnant      Passed - Valid encounter within last 12 months    Recent Outpatient Visits          5 months ago Centrilobular emphysema ([place])   [facility] Olin Hauser, DO   9 months ago Centrilobular emphysema [facility])   [facility] Olin Hauser, DO   1 year ago Annual physical exam   [facility] Olin Hauser, DO   1 year ago B12 deficiency   [place], DO   2 years ago History of cerebrovascular accident (CVA) with residual deficit   [place], DO

## 2022-04-23 ENCOUNTER — Other Ambulatory Visit: Payer: Self-pay | Admitting: Family Medicine

## 2022-04-23 DIAGNOSIS — G8929 Other chronic pain: Secondary | ICD-10-CM

## 2022-04-23 NOTE — Telephone Encounter (Signed)
Rx 02/08/22 #90 3RF- too soon Requested Prescriptions  Pending Prescriptions Disp Refills  . baclofen (LIORESAL) 10 MG tablet [Pharmacy Med Name: BACLOFEN 10 MG Tablet] 270 tablet     Sig: TAKE 1 TABLET THREE TIMES DAILY     Analgesics:  Muscle Relaxants - baclofen Passed - 04/23/2022  2:57 AM      Passed - Cr in normal range and within 180 days    Creat  Date Value Ref Range Status  12/23/2021 1.08 0.70 - 1.35 mg/dL Final         Passed - eGFR is 30 or above and within 180 days    GFR, Est African American  Date Value Ref Range Status  12/22/2020 85 > OR = 60 mL/min/1.10m Final   GFR, Est Non African American  Date Value Ref Range Status  12/22/2020 74 > OR = 60 mL/min/1.735mFinal   eGFR  Date Value Ref Range Status  12/23/2021 75 > OR = 60 mL/min/1.7397minal    Comment:    The eGFR is based on the CKD-EPI 2021 equation. To calculate  the new eGFR from a previous Creatinine or Cystatin C result, go to https://www.kidney.org/professionals/ kdoqi/gfr%5Fcalculator          Passed - Valid encounter within last 6 months    Recent Outpatient Visits          5 months [facility]   [facility] months [facility]   1 year ago [facility]   1 year ago B12 deficiency   [place]   2 years ago History of cerebrovascular accident (CVA) with residual deficit   [place]

## 2022-04-25 ENCOUNTER — Other Ambulatory Visit: Payer: Self-pay | Admitting: Family Medicine

## 2022-04-25 DIAGNOSIS — G8929 Other chronic pain: Secondary | ICD-10-CM

## 2022-04-27 NOTE — Telephone Encounter (Signed)
Requested Prescriptions  Pending Prescriptions Disp Refills  . naproxen (NAPROSYN) 500 MG tablet [Pharmacy Med Name: NAPROXEN 500 MG TABLET] 60 tablet 2    Sig: TAKE 1 TABLET (500 MG TOTAL) BY MOUTH 2 (TWO) TIMES DAILY AS NEEDED FOR MILD PAIN OR MODERATE PAIN.     Analgesics:  NSAIDS Failed - 04/25/2022  9:30 AM      Failed - Manual Review: Labs are only required if the patient has taken medication for more than 8 weeks.      Passed - Cr in normal range and within 360 days    Creat  Date Value Ref Range Status  12/23/2021 1.08 0.70 - 1.35 mg/dL Final         Passed - HGB in normal range and within 360 days    Hemoglobin  Date Value Ref Range Status  12/23/2021 16.0 13.2 - 17.1 g/dL Final         Passed - PLT in normal range and within 360 days    Platelets  Date Value Ref Range Status  12/23/2021 174 140 - 400 Thousand/uL Final         Passed - HCT in normal range and within 360 days    HCT  Date Value Ref Range Status  12/23/2021 47.9 38.5 - 50.0 % Final         Passed - eGFR is 30 or above and within 360 days    GFR, Est African American  Date Value Ref Range Status  12/22/2020 85 > OR = 60 mL/min/1.23m Final   GFR, Est Non African American  Date Value Ref Range Status  12/22/2020 74 > OR = 60 mL/min/1.742mFinal   eGFR  Date Value Ref Range Status  12/23/2021 75 > OR = 60 mL/min/1.7345minal    Comment:    The eGFR is based on the CKD-EPI 2021 equation. To calculate  the new eGFR from a previous Creatinine or Cystatin C result, go to https://www.kidney.org/professionals/ kdoqi/gfr%5Fcalculator          Passed - Patient is not pregnant      Passed - Valid encounter within last 12 months    Recent Outpatient Visits          5 months ago [facility]   9 months [facility]   1 year ago [facility] physical  [facility]   1 year ago B12 deficiency   [place]   2 years ago History of cerebrovascular accident (CVA) with residual deficit   [place]

## 2022-05-17 ENCOUNTER — Ambulatory Visit: Payer: Medicare HMO | Admitting: Physician Assistant

## 2022-05-18 ENCOUNTER — Encounter: Payer: Self-pay | Admitting: Physician Assistant

## 2022-05-18 ENCOUNTER — Ambulatory Visit (INDEPENDENT_AMBULATORY_CARE_PROVIDER_SITE_OTHER): Payer: Medicare HMO | Admitting: Physician Assistant

## 2022-05-18 VITALS — BP 110/80 | HR 85 | Ht 72.0 in | Wt 164.8 lb

## 2022-05-18 DIAGNOSIS — J432 Centrilobular emphysema: Secondary | ICD-10-CM | POA: Diagnosis not present

## 2022-05-18 DIAGNOSIS — J302 Other seasonal allergic rhinitis: Secondary | ICD-10-CM | POA: Diagnosis not present

## 2022-05-18 NOTE — Assessment & Plan Note (Signed)
Chronic, historic condition, potential mild exacerbation at this time He is taking his daily inhaler as directed, rarely using Albuterol  Reports mild SOB with exertion and coughing  No other symptoms of infectious etiology bolsters suspicions of exacerbations Recommend he use Albuterol inhaler during coughing and periods of SOB  Follow up as needed for persistent or progressing symptoms

## 2022-05-18 NOTE — Progress Notes (Signed)
Acute Office Visit   Patient: Ethan Fitzgerald   DOB: 12-Aug-1956   66 y.o. Male  MRN: [id number] Visit Date: 05/18/2022  Today's healthcare provider: Dani Gobble Trace Wirick, PA-C  Introduced myself to the patient as a Journalist, newspaper and provided education on APPs in clinical practice.    Chief Complaint  Patient presents with   Cough   Nasal Congestion   Subjective    Cough Associated symptoms include wheezing. Pertinent negatives include no chills, ear pain, fever, headaches, myalgias, postnasal drip, rhinorrhea or sore throat.      Reports nonproductive cough since Sat Reports his wife was recently in the hospital with pneumonia (last week)  Reports some runny nose in the AM but this resolves- reports this is chronic  He has not tested for COVID at home  Interventions: he has not taken anything for the cough  He has not used Albuterol since this started   Medications: Outpatient Medications Prior to Visit  Medication Sig   albuterol (VENTOLIN HFA) 108 (90 Base) MCG/ACT inhaler INHALE 1 TO 2 PUFFS EVERY 4 HOURS AS NEEDED FOR WHEEZING OR SHORTNESS OF BREATH   aspirin 81 MG chewable tablet    aspirin EC 325 MG EC tablet Take 1 tablet (325 mg total) by mouth daily.   baclofen (LIORESAL) 10 MG tablet Take 1 tablet (10 mg total) by mouth 3 (three) times daily.   cholecalciferol (VITAMIN D3) 25 MCG (1000 UNIT) tablet Take 1,000 Units by mouth daily.   fludrocortisone (FLORINEF) 0.1 MG tablet TAKE 1 TABLET (0.1 MG TOTAL) BY MOUTH DAILY. (Patient taking differently: Take 0.1 mg by mouth as needed.)   naproxen (NAPROSYN) 500 MG tablet TAKE 1 TABLET (500 MG TOTAL) BY MOUTH 2 (TWO) TIMES DAILY AS NEEDED FOR MILD PAIN OR MODERATE PAIN.   rosuvastatin (CRESTOR) 40 MG tablet TAKE 1 TABLET AT BEDTIME . APPOINTMENT IS NEEDED   sildenafil (REVATIO) 20 MG tablet TAKE 1 TO 5 TABLETS BY MOUTH ABOUT 30 MINUTES PRIOR TO SEX.START WITH 1 THEN INCREASE   Tiotropium Bromide-Olodaterol (STIOLTO RESPIMAT)  2.5-2.5 MCG/ACT AERS Inhale 2 puffs into the lungs daily.   vitamin B-12 (CYANOCOBALAMIN) 1000 MCG tablet Take 1 tablet (1,000 mcg total) by mouth daily.   vitamin C (ASCORBIC ACID) 500 MG tablet Take 500 mg by mouth daily. Am   [DISCONTINUED] predniSONE (DELTASONE) 20 MG tablet Take daily with food. Start with '60mg'$  (3 pills) x 2 days, then reduce to '40mg'$  (2 pills) x 2 days, then '20mg'$  (1 pill) x 3 days   No facility-administered medications prior to visit.    Review of Systems  Constitutional:  Negative for chills, diaphoresis, fatigue and fever.  HENT:  Negative for congestion, ear pain, postnasal drip, rhinorrhea, sinus pressure, sinus pain and sore throat.   Respiratory:  Positive for cough and wheezing.   Gastrointestinal:  Negative for diarrhea, nausea and vomiting.  Musculoskeletal:  Negative for myalgias and neck stiffness.  Neurological:  Negative for dizziness, light-headedness and headaches.       Objective    BP 110/80   Pulse 85   Ht 6' (1.829 m)   Wt 164 lb 12.8 oz (74.8 kg)   SpO2 99%   BMI 22.35 kg/m    Physical Exam Vitals reviewed.  Constitutional:      General: He is awake.     Appearance: Normal appearance. He is well-developed, well-groomed and normal weight.  HENT:     Head: Normocephalic  and atraumatic.     Right Ear: Tympanic membrane, ear canal and external ear normal.     Left Ear: Tympanic membrane, ear canal and external ear normal.     Mouth/Throat:     Mouth: Mucous membranes are moist.     Pharynx: Oropharynx is clear.  Eyes:     Extraocular Movements: Extraocular movements intact.     Conjunctiva/sclera: Conjunctivae normal.     Pupils: Pupils are equal, round, and reactive to light.  Cardiovascular:     Rate and Rhythm: Normal rate and regular rhythm.     Pulses: Normal pulses.     Heart sounds: Normal heart sounds. No murmur heard.    No friction rub. No gallop.  Pulmonary:     Effort: Pulmonary effort is normal. No respiratory  distress.     Breath sounds: Normal breath sounds. No transmitted upper airway sounds. No wheezing, rhonchi or rales.  Musculoskeletal:     Cervical back: Normal range of motion.  Lymphadenopathy:     Head:     Right side of head: No submental, submandibular or preauricular adenopathy.     Left side of head: No submental, submandibular or preauricular adenopathy.     Upper Body:     Right upper body: No supraclavicular adenopathy.     Left upper body: No supraclavicular adenopathy.  Neurological:     General: No focal deficit present.     Mental Status: He is alert and oriented to person, place, and time.  Psychiatric:        Mood and Affect: Mood normal.        Behavior: Behavior normal. Behavior is cooperative.        Thought Content: Thought content normal.        Judgment: Judgment normal.       No results found for any visits on 05/18/22.  Assessment & Plan      No follow-ups on file.       Problem List Items Addressed This Visit       Respiratory   Centrilobular emphysema (HCC)    Chronic, historic condition, potential mild exacerbation at this time He is taking his daily inhaler as directed, rarely using Albuterol  Reports mild SOB with exertion and coughing  No other symptoms of infectious etiology bolsters suspicions of exacerbations Recommend he use Albuterol inhaler during coughing and periods of SOB  Follow up as needed for persistent or progressing symptoms         Other   Seasonal allergies - Primary    Chronic, recurrent Potentially experiencing a flare at this time given season and symptoms Recommend using OTC second gen antihistamine per preference to assist with symptoms Follow up as needed         No follow-ups on file.   I, Torra Pala E Ladye Macnaughton, PA-C, have reviewed all documentation for this visit. The documentation on 05/18/22 for the exam, diagnosis, procedures, and orders are all accurate and complete.   Talitha Givens, MHS,  PA-C [facility]

## 2022-05-18 NOTE — Assessment & Plan Note (Signed)
Chronic, recurrent Potentially experiencing a flare at this time given season and symptoms Recommend using OTC second gen antihistamine per preference to assist with symptoms Follow up as needed

## 2022-05-18 NOTE — Patient Instructions (Addendum)
I think you may be experiencing an exacerbation of your emphysema and seasonal allergies I would like you to start a daily  antihistamine such as Claritin, Allegra, Zyrtec - generics of these typically work just as well as the brand names  Use your Albuterol inhaler when you feel the coughing spells or chest tightness. If you are using it more than twice per day, almost every day for about 2 weeks let us know so we can re-evaluate your inhaler regimen.

## 2022-05-25 ENCOUNTER — Telehealth: Payer: Self-pay

## 2022-05-25 NOTE — Telephone Encounter (Signed)
Copied from [place] [id number]. Topic: General - Inquiry >> May 25, 2022  9:32 AM Penni Bombard wrote: Reason for CRM: Pt's wife [name] called saying pt's work is offering a b12 shot and she is wondering if it is ok for him to take it.  CB@  [phone]

## 2022-05-25 NOTE — Telephone Encounter (Signed)
Will defer to PCP. B12 level from 422 was on low end of normal. It might not hurt but PCP may want current B12 level checked first.

## 2022-05-26 NOTE — Telephone Encounter (Signed)
I would be okay if he took B12 shot from work.  I would suggest repeat blood lab level after doing B12 shots for up to max 3 months.  [name], [facility] 05/26/2022, 12:43 PM

## 2022-05-27 NOTE — Telephone Encounter (Signed)
A my chart message has been sent to the patient

## 2022-06-29 ENCOUNTER — Telehealth: Payer: Self-pay | Admitting: Family Medicine

## 2022-06-29 DIAGNOSIS — J432 Centrilobular emphysema: Secondary | ICD-10-CM

## 2022-06-29 NOTE — Telephone Encounter (Signed)
The explanation is that this is a [employer] or "Coverage Gap" issue that happens to many patients at end of the year and will reset his coverage after 09/05/22.  If he has further questions on how this works, actually it is his insurance / medicare that should have more answers on the financials, not our office.  We do not have enough samples of inhalers to use for 2 months to allow patient to make it to January.  We do not have that exact inhaler anyway, we have some Breztri samples, but only 7 day samples.  I will refer him to our clinical pharmacist Wallace Cullens [facility] CPP to assist with financial med assistance on the COPD inhalers and see if he can get a voucher or other coverage options at this time.  [name], [facility] 06/29/2022, 12:52 PM

## 2022-06-29 NOTE — Telephone Encounter (Signed)
Pt stated [facility] advised his wife that the medication Tiotropium Bromide-Olodaterol (STIOLTO RESPIMAT) 2.5-2.5 MCG/ACT AERS went from $125.00 to $400.00 stated this will only last until December 31st after goes back down in January. Pt stated he does not understand how this is possible; however, they suggested he call PCP and ask for samples.  Pt seeking advice stated he could not afford the medication at $400.00.  Please advise.

## 2022-06-30 ENCOUNTER — Ambulatory Visit: Payer: Medicare HMO | Admitting: Pharmacist

## 2022-06-30 DIAGNOSIS — J432 Centrilobular emphysema: Secondary | ICD-10-CM

## 2022-06-30 NOTE — Patient Instructions (Signed)
Goals Addressed             This Visit's Progress    Pharmacy Goals       Please have your medications with you for our next telephone call on 07/02/2022 at 10:45 AM  Thank you!  Wallace Cullens, PharmD, Para [place], CPP Clinical Pharmacist [facility] [phone]

## 2022-06-30 NOTE — Chronic Care Management (AMB) (Signed)
06/30/2022 Name: Ethan Fitzgerald MRN: [id number] DOB: 08/15/1956  Chief Complaint  Patient presents with   Medication Assistance    Ethan Fitzgerald is a 66 y.o. year old male who presented for a telephone visit.   They were referred to the pharmacist by their PCP for assistance in managing medication access.   Receive message/referral from PCP advising patient contacted the office requesting assistance with cost of his Stiolto Respimat.   Subjective:  Care Team: Primary Care Provider: Olin Hauser, DO Neurologist: [facility]  Medication Access/Adherence  Current Pharmacy:  [place], [place] [address] Phone: [phone] Fax: [place], [address] [place] [address] Phone: [phone] Fax: [phone]  [id number] [place] [address] [id number]: [phone]: [phone]  Patient reports affordability concerns with their medications: Yes  Patient reports access/transportation concerns to their pharmacy: No  Patient reports adherence concerns with their medications:  No    Reports cost of Stiolto Respimat is no longer affordable now that he has reached the coverage gap of his [employer] Medicare Part D coverage - Reports currently has a 30 day supply of Stiolto remaining   Objective:  Lab Results  Component Value Date   CREATININE 1.08 12/23/2021   BUN 16 12/23/2021   NA 140 12/23/2021   K 3.9 12/23/2021   CL 109 12/23/2021   CO2 25 12/23/2021    Medications    Reviewed by WOrinda Kenner CMA (Certified Medical Assistant) on 05/18/22 at [phone] Med List Status: <None>   Medication Order Taking? Sig Documenting Provider Last Dose Status Informant  albuterol (VENTOLIN HFA) 108 (90 Base) MCG/ACT inhaler [id number] INHALE 1 TO 2 PUFFS EVERY 4  HOURS AS NEEDED FOR WHEEZING OR SHORTNESS OF BREATH Karamalegos, ADevonne Doughty DO Taking Active   aspirin 81 MG chewable tablet [id number]  [provider] Taking Active   aspirin EC 325 MG EC tablet [id number] Take 1 tablet (325 mg total) by mouth daily. LBary Leriche PA-C Taking Active   baclofen (LIORESAL) 10 MG tablet [id number] Take 1 tablet (10 mg total) by mouth 3 (three) times daily. KOlin Hauser DO Taking Active   cholecalciferol (VITAMIN D3) 25 MCG (1000 UNIT) tablet [id number] Take 1,000 Units by mouth daily. [provider] Taking Active   fludrocortisone (FLORINEF) 0.1 MG tablet [id number] TAKE 1 TABLET (0.1 MG TOTAL) BY MOUTH DAILY.  Patient taking differently: Take 0.1 mg by mouth as needed.   GMinna Merritts MD Taking Active   naproxen (NAPROSYN) 500 MG tablet [id number] TAKE 1 TABLET (500 MG TOTAL) BY MOUTH 2 (TWO) TIMES DAILY AS NEEDED FOR MILD PAIN OR MODERATE PAIN. KOlin Hauser DO Taking Active   predniSONE (DELTASONE) 20 MG tablet [id number] Take daily with food. Start with '60mg'$  (3 pills) x 2 days, then reduce to '40mg'$  (2 pills) x 2 days, then '20mg'$  (1 pill) x 3 days KOlin Hauser DO Taking Active   rosuvastatin (CRESTOR) 40 MG tablet [id number] TAKE 1 TABLET AT BEDTIME . APPOINTMENT IS NEEDED KOlin Hauser DO Taking Active   sildenafil (REVATIO) 20 MG tablet [id number] TAKE 1 TO 5 TABLETS BY MOUTH ABOUT 30 MINUTES PRIOR TO SEX.START WITH 1 THEN INCREASE  Olin Hauser, DO Taking Active   Tiotropium Bromide-Olodaterol (STIOLTO RESPIMAT) 2.5-2.5 MCG/ACT AERS [id number] Yes Inhale 2 puffs into the lungs daily. Olin Hauser, DO Taking Active   vitamin B-12 (CYANOCOBALAMIN) 1000 MCG tablet [id number] Yes Take 1 tablet (1,000 mcg total) by mouth daily. Olin Hauser, DO Taking Active   vitamin C (ASCORBIC ACID) 500 MG tablet [id number] Yes Take 500 mg by mouth  daily. Am [provider] Taking Active Spouse/Significant Other              Assessment/Plan:   Unable to complete medication review today as patient not currently home. - Agree to review at upcoming appointment  Medication Assistance: - Patient requests to schedule a time when both patient and wife are able to speak with Clinical Pharmacist to review medication assistance options/eligibility information  Follow Up Plan: Clinical Pharmacist will outreach to patient/wife by telephone as requested on 07/02/2022 at 10:45 am  Wallace Cullens, PharmD, Para [place], [facility] [phone]

## 2022-07-02 ENCOUNTER — Ambulatory Visit: Payer: Medicare HMO | Admitting: Pharmacist

## 2022-07-02 DIAGNOSIS — J432 Centrilobular emphysema: Secondary | ICD-10-CM

## 2022-07-02 NOTE — Chronic Care Management (AMB) (Signed)
07/02/2022 Name: Ethan Fitzgerald MRN: [id number] DOB: 09-Jun-1956  Chief Complaint  Patient presents with   Medication Assistance    Ethan Fitzgerald is a 66 y.o. year old male referred to the pharmacist by their PCP for assistance in managing medication access for Stiolto Respimat.  Outreach to patient's wife (listed on [place]) today as previously requested by patient. Unable to reach patient by telephone today.   Subjective:  Care Team: Primary Care Provider: Olin Hauser, DO Neurologist: [facility]  Medication Access/Adherence  Current Pharmacy:  [facility], [place] [address] Phone: [phone] Fax: [facility] [place], [address] [place] [address] Phone: [phone] Fax: [phone]  [id number] [address] [place]: [phone]: [phone]  Patient reports affordability concerns with their medications: Yes  Patient reports access/transportation concerns to their pharmacy: No  Patient reports adherence concerns with their medications:  No     Reports cost of Stiolto Respimat is no longer affordable now that he has reached the coverage gap of his [employer] Medicare Part D coverage - Reports currently has a 30 day supply of Stiolto remaining  Today based on reported finances from patient's spouse, - Patient exceeds income limit for Stiolto inhaler patient assistance program - Patient is close to the income limit for Bevespi inhaler.  Spouse wants to review financial documents to determine if patient below or exceeds this limit and then follow up with Clinical Pharmacist next week   Objective:  Lab Results  Component Value Date   CREATININE 1.08 12/23/2021   BUN 16 12/23/2021   NA 140 12/23/2021   K 3.9 12/23/2021   CL 109 12/23/2021   CO2 25  12/23/2021    Lab Results  Component Value Date   CHOL 115 12/23/2021   HDL 45 12/23/2021   LDLCALC 44 12/23/2021   TRIG 185 (H) 12/23/2021   CHOLHDL 2.6 12/23/2021    Medications Reviewed Today     Reviewed by WOrinda Kenner CMA (Certified Medical Assistant) on 05/18/22 at [phone] Med List Status: <None>   Medication Order Taking? Sig Documenting Provider Last Dose Status Informant  albuterol (VENTOLIN HFA) 108 (90 Base) MCG/ACT inhaler [id number] INHALE 1 TO 2 PUFFS EVERY 4 HOURS AS NEEDED FOR WHEEZING OR SHORTNESS OF BREATH Karamalegos, ADevonne Doughty DO Taking Active   aspirin 81 MG chewable tablet [id number]  [provider] Taking Active   aspirin EC 325 MG EC tablet [id number] Take 1 tablet (325 mg total) by mouth daily. LBary Leriche PA-C Taking Active   baclofen (LIORESAL) 10 MG tablet [id number] Take 1 tablet (10 mg total) by mouth 3 (three) times daily. KOlin Hauser DO Taking Active   cholecalciferol (VITAMIN D3) 25 MCG (1000 UNIT) tablet [id number] Take 1,000 Units by mouth daily. [provider] Taking Active   fludrocortisone (FLORINEF) 0.1 MG tablet [id number] TAKE 1 TABLET (0.1 MG TOTAL) BY MOUTH DAILY.  Patient taking differently: Take 0.1 mg by mouth as needed.   GMinna Merritts MD Taking Active   naproxen (NAPROSYN) 500 MG tablet [id number] TAKE 1 TABLET (500 MG TOTAL) BY MOUTH 2 (TWO) TIMES DAILY AS NEEDED FOR MILD PAIN OR MODERATE PAIN. KOlin Hauser DO Taking Active   predniSONE (  DELTASONE) 20 MG tablet [id number] Yes Take daily with food. Start with '60mg'$  (3 pills) x 2 days, then reduce to '40mg'$  (2 pills) x 2 days, then '20mg'$  (1 pill) x 3 days Olin Hauser, DO Taking Active   rosuvastatin (CRESTOR) 40 MG tablet [id number] Yes TAKE 1 TABLET AT BEDTIME . APPOINTMENT IS NEEDED Olin Hauser, DO Taking Active   sildenafil (REVATIO) 20 MG tablet [id number] Yes TAKE 1 TO 5 TABLETS BY  MOUTH ABOUT 30 MINUTES PRIOR TO SEX.START WITH 1 THEN INCREASE Karamalegos, Devonne Doughty, DO Taking Active   Tiotropium Bromide-Olodaterol (STIOLTO RESPIMAT) 2.5-2.5 MCG/ACT AERS [id number] Yes Inhale 2 puffs into the lungs daily. Olin Hauser, DO Taking Active   vitamin B-12 (CYANOCOBALAMIN) 1000 MCG tablet [id number] Yes Take 1 tablet (1,000 mcg total) by mouth daily. Olin Hauser, DO Taking Active   vitamin C (ASCORBIC ACID) 500 MG tablet [id number] Yes Take 500 mg by mouth daily. Am [provider] Taking Active Spouse/Significant Other              Assessment/Plan:   Unable to complete medication review today as patient not currently home. - Agree to review at next appointment  Medication Assistance: - Review with patient potential medication assistance options based on reported income, including: - Change to/application for Bevespi patient assistance program (spouse/patient to review financial information to determine if eligible) - See if patient eligible for 30 day supply of Stiolto inhaler program (if not previously used) to last through end of calendar year - Possible change in therapy to twice daily nebulized regimen for cost savings (as billed through Part B coverage - Investigate if eligible for [place] [phone]) for cost savings in 2024 calendar year - Agree to follow up again next week once patient/spouse have had time to further review financial documents and discuss   Follow Up Plan: Clinical Pharmacist will outreach to patient/wife by telephone as requested on 07/09/2022 at 10 am   Wallace Cullens, PharmD, [place], [place] [phone]

## 2022-07-02 NOTE — Patient Instructions (Signed)
Goals Addressed             This Visit's Progress    Pharmacy Goals       Please have your medications with you for our next telephone call.  Thank you!  Wallace Cullens, PharmD, [place], CPP Clinical Pharmacist [facility] [phone]

## 2022-07-09 ENCOUNTER — Ambulatory Visit: Payer: Medicare HMO | Admitting: Pharmacist

## 2022-07-09 DIAGNOSIS — J432 Centrilobular emphysema: Secondary | ICD-10-CM

## 2022-07-09 NOTE — Patient Instructions (Signed)
Goals Addressed             This Visit's Progress    Pharmacy Goals       Please contact [facility] COPD Inhaler Support Program [phone]) regarding enrollment.  Thank you!  Wallace Cullens, PharmD, [place], CPP Clinical Pharmacist [facility] [phone]

## 2022-07-09 NOTE — Chronic Care Management (AMB) (Signed)
07/09/2022 Name: Ethan Fitzgerald MRN: [id number] DOB: October 25, 1955  Chief Complaint  Patient presents with   Medication Assistance    Ethan Fitzgerald is a 66 y.o. male referred to the pharmacist by their PCP for assistance in managing medication access for Stiolto Respimat.   Outreach to patient and wife by telephone today.  Subjective:  Care Team: Primary Care Provider: Olin Hauser, DO Neurologist: [facility]  Medication Access/Adherence  Current Pharmacy:  [facility], [place] [address] Phone: [phone] Fax: [place], [address] [place] [place] [address] Phone: [phone] Fax: [phone]  [id number] [address] [address] [place]: [phone]: [phone]  Patient reports affordability concerns with their medications: Yes  Patient reports access/transportation concerns to their pharmacy: No  Patient reports adherence concerns with their medications:  No     Reports cost of Stiolto Respimat is no longer affordable now that he has reached the coverage gap of his [employer] Medicare Part D coverage - Reports currently has enough Stiolto remaining to last until end of November   Today based on reported finances from patient's spouse, - Patient exceeds income limit for both Stiolto and Bevespi patient assistance program   Objective:  Lab Results  Component Value Date   CREATININE 1.08 12/23/2021   BUN 16 12/23/2021   NA 140 12/23/2021   K 3.9 12/23/2021   CL 109 12/23/2021   CO2 25 12/23/2021    Lab Results  Component Value Date   CHOL 115 12/23/2021   HDL 45 12/23/2021   LDLCALC 44 12/23/2021   TRIG 185 (H) 12/23/2021   CHOLHDL 2.6 12/23/2021    Medications Reviewed Today     Reviewed by DRennis Petty RPH-CPP (Pharmacist) on  07/09/22 at 1008  Med List Status: <None>   Medication Order Taking? Sig Documenting Provider Last Dose Status Informant  albuterol (VENTOLIN HFA) 108 (90 Base) MCG/ACT inhaler [id number] INHALE 1 TO 2 PUFFS EVERY 4 HOURS AS NEEDED FOR WHEEZING OR SHORTNESS OF BREATH KOlin Hauser DO Taking Active   aspirin EC 325 MG EC tablet [id number] Take 1 tablet (325 mg total) by mouth daily. LBary Leriche PVermontTaking Active     Discontinued 07/09/22 1008 (No longer needed (for PRN medications))   cholecalciferol (VITAMIN D3) 25 MCG (1000 UNIT) tablet [id number] Take 1,000 Units by mouth daily. [provider] Taking Active   fludrocortisone (FLORINEF) 0.1 MG tablet [id number] TAKE 1 TABLET (0.1 MG TOTAL) BY MOUTH DAILY.  Patient taking differently: Take 0.1 mg by mouth as needed.   GMinna Merritts MD  Active   naproxen (NAPROSYN) 500 MG tablet [id number] TAKE 1 TABLET (500 MG TOTAL) BY MOUTH 2 (TWO) TIMES DAILY AS NEEDED FOR MILD PAIN OR MODERATE PAIN. KOlin Hauser DO  Active   rosuvastatin (CRESTOR) 40 MG tablet [id number] TAKE 1 TABLET AT BEDTIME . APPOINTMENT IS NEEDED KOlin Hauser DO Taking Active   sildenafil (REVATIO) 20 MG tablet [id number] TAKE 1 TO 5 TABLETS BY MOUTH ABOUT 30 MINUTES PRIOR TO SEX.START WITH 1 THEN INCREASE Karamalegos, ADevonne Doughty DO  Active   Tiotropium Bromide-Olodaterol (STIOLTO RESPIMAT) 2.5-2.5 MCG/ACT AERS [id number] Inhale 2 puffs into the lungs daily. KOlin Hauser DO Taking  Active   vitamin B-12 (CYANOCOBALAMIN) 1000 MCG tablet [id number] Yes Take 1 tablet (1,000 mcg total) by mouth daily. Olin Hauser, DO Taking Active   vitamin C (ASCORBIC ACID) 500 MG tablet [id number] Yes Take 500 mg by mouth daily. Am [provider] Taking Active Spouse/Significant Other             Assessment/Plan:   Comprehensive medication review performed; medication list updated in  electronic medical record  Medication Assistance: - Collaborate with [employer] representative regarding [place] that program available in 2024 calendar year for patient's plan Patient/spouse to contact [place] [phone]) regarding enrollment for 2024 and determine if patient eligible for end of 2023. - Reviewed with patient/spouse additional medication assistance options based on reported income, including: - See if patient eligible for 30 day supply of Stiolto inhaler program (if not previously used) to last through end of calendar year - Possible change in therapy to twice daily nebulized regimen for cost savings (as billed through Part B coverage   Follow Up Plan: Clinical Pharmacist will outreach to patient/wife by telephone again within the next 2 weeks  Wallace Cullens, PharmD, [place], [facility] [place] [phone]

## 2022-07-19 ENCOUNTER — Ambulatory Visit: Payer: Medicare HMO | Admitting: Pharmacist

## 2022-07-19 DIAGNOSIS — J432 Centrilobular emphysema: Secondary | ICD-10-CM

## 2022-07-19 NOTE — Patient Instructions (Signed)
Goals Addressed             This Visit's Progress    Pharmacy Goals       Please keep in touch with [facility] COPD Inhaler Support Program [phone]) to maintain enrollment.  Please call me if you have any further medication questions/concerns.  Thank you!  Wallace Cullens, PharmD, [place], CPP Clinical Pharmacist [facility] [phone]

## 2022-07-19 NOTE — Chronic Care Management (AMB) (Signed)
07/19/2022 Name: Ethan Fitzgerald MRN: [id number] DOB: [birth year]  Chief Complaint  Patient presents with   Medication Assistance   Ethan Fitzgerald is a 66 y.o. male referred to the pharmacist by their PCP for assistance in managing medication access for Stiolto Respimat.   Outreach to patient and wife by telephone today.   Subjective:   Care Team: Primary Care Provider: Olin Hauser, DO Neurologist: [facility]  Medication Access/Adherence  Current Pharmacy:  [place], [place] [address] Phone: [phone] Fax: [place], [place]. [place] [address] Phone: [phone] Fax: [phone]  [id number] [address] [address] [place]Phone: [phone]: [phone]  Patient reports affordability concerns with their medications: No Patient reports access/transportation concerns to their pharmacy: No  Patient reports adherence concerns with their medications:  No       Previously reported cost of Stiolto Respimat no longer affordable as he had reached the coverage gap of his [employer] Medicare Part D coverage - Reports currently has enough Stiolto remaining to last until end of November - Today confirms contacted and now enrolled in [place] recommended      Objective:  Lab Results  Component Value Date   CREATININE 1.08 12/23/2021   BUN 16 12/23/2021   NA 140 12/23/2021   K 3.9 12/23/2021   CL 109 12/23/2021   CO2 25 12/23/2021    Medications Reviewed Today     Reviewed by DRennis Petty RPH-CPP (Pharmacist) on 07/09/22 at 1008  Med List Status: <None>   Medication Order Taking? Sig Documenting Provider Last Dose Status Informant  albuterol (VENTOLIN HFA) 108 (90 Base) MCG/ACT inhaler [id number] INHALE 1 TO 2  PUFFS EVERY 4 HOURS AS NEEDED FOR WHEEZING OR SHORTNESS OF BREATH KOlin Hauser DO Taking Active   aspirin EC 325 MG EC tablet [id number] Take 1 tablet (325 mg total) by mouth daily. LBary Leriche PVermontTaking Active     Discontinued 07/09/22 1008 (No longer needed (for PRN medications))   cholecalciferol (VITAMIN D3) 25 MCG (1000 UNIT) tablet [id number] Take 1,000 Units by mouth daily. [provider] Taking Active   fludrocortisone (FLORINEF) 0.1 MG tablet [id number] TAKE 1 TABLET (0.1 MG TOTAL) BY MOUTH DAILY.  Patient taking differently: Take 0.1 mg by mouth as needed.   GMinna Merritts MD  Active   naproxen (NAPROSYN) 500 MG tablet [id number] TAKE 1 TABLET (500 MG TOTAL) BY MOUTH 2 (TWO) TIMES DAILY AS NEEDED FOR MILD PAIN OR MODERATE PAIN. KOlin Hauser DO  Active   rosuvastatin (CRESTOR) 40 MG tablet [id number] TAKE 1 TABLET AT BEDTIME . APPOINTMENT IS NEEDED KOlin Hauser DO Taking Active   sildenafil (REVATIO) 20 MG tablet [id number] TAKE 1 TO 5 TABLETS BY MOUTH ABOUT 30 MINUTES PRIOR TO SEX.START WITH 1 THEN INCREASE Karamalegos, ADevonne Doughty DO  Active   Tiotropium Bromide-Olodaterol (STIOLTO RESPIMAT) 2.5-2.5 MCG/ACT AERS [id number] Inhale 2 puffs into the lungs daily. KOlin Hauser DO Taking Active   vitamin B-12 (CYANOCOBALAMIN) 1000 MCG tablet [id number] Take 1 tablet (1,000 mcg total) by mouth daily. KOlin Hauser DO Taking Active   vitamin C (ASCORBIC ACID) 500 MG tablet [id number] Take 500  mg by mouth daily. Am [provider] Taking Active Spouse/Significant Other              Assessment/Plan:   Medication Assistance: - No further assistance needed as patient now enrolled in [place]   Follow Up Plan:  Patient denies further medication questions or concerns today Provide patient with contact information for clinic pharmacist to contact if  needed in future for medication questions/concerns   Wallace Cullens, PharmD, Para [place], [facility] [phone]

## 2022-07-22 ENCOUNTER — Other Ambulatory Visit: Payer: Self-pay | Admitting: Family Medicine

## 2022-07-22 DIAGNOSIS — G8929 Other chronic pain: Secondary | ICD-10-CM

## 2022-07-22 NOTE — Telephone Encounter (Signed)
Requested Prescriptions  Pending Prescriptions Disp Refills   naproxen (NAPROSYN) 500 MG tablet [Pharmacy Med Name: NAPROXEN 500 MG TABLET] 60 tablet 2    Sig: TAKE 1 TABLET (500 MG TOTAL) BY MOUTH 2 (TWO) TIMES DAILY AS NEEDED FOR MILD PAIN OR MODERATE PAIN.     Analgesics:  NSAIDS Failed - 07/22/2022  2:14 AM      Failed - Manual Review: Labs are only required if the patient has taken medication for more than 8 weeks.      Passed - Cr in normal range and within 360 days    Creat  Date Value Ref Range Status  12/23/2021 1.08 0.70 - 1.35 mg/dL Final         Passed - HGB in normal range and within 360 days    Hemoglobin  Date Value Ref Range Status  12/23/2021 16.0 13.2 - 17.1 g/dL Final         Passed - PLT in normal range and within 360 days    Platelets  Date Value Ref Range Status  12/23/2021 174 140 - 400 Thousand/uL Final         Passed - HCT in normal range and within 360 days    HCT  Date Value Ref Range Status  12/23/2021 47.9 38.5 - 50.0 % Final         Passed - eGFR is 30 or above and within 360 days    GFR, Est African American  Date Value Ref Range Status  12/22/2020 85 > OR = 60 mL/min/1.30m Final   GFR, Est Non African American  Date Value Ref Range Status  12/22/2020 74 > OR = 60 mL/min/1.756mFinal   eGFR  Date Value Ref Range Status  12/23/2021 75 > OR = 60 mL/min/1.7368minal    Comment:    The eGFR is based on the CKD-EPI 2021 equation. To calculate  the new eGFR from a previous Creatinine or Cystatin C result, go to https://www.kidney.org/professionals/ kdoqi/gfr%5Fcalculator          Passed - Patient is not pregnant      Passed - Valid encounter within last 12 months    Recent Outpatient Visits           3 days ago Centrilobular emphysema ([facility] [place] [facility]   1 week ago Centrilobular emphysema ([facility], EliGrayland Ormond RPH-CPP   2 weeks ago [facility]rilobular  [facility]physe[facility] ([facility]   3 weeks ago [facility]rilobular emphysema ([facility] [facility] RPH-CPP   2 months ago Seasonal allergies   [facility], EriDani GobbleA-[place]

## 2022-08-02 ENCOUNTER — Other Ambulatory Visit: Payer: Self-pay | Admitting: Family Medicine

## 2022-08-02 DIAGNOSIS — J432 Centrilobular emphysema: Secondary | ICD-10-CM

## 2022-08-03 NOTE — Telephone Encounter (Signed)
Requested medication (s) are due for refill today: yes  Requested medication (s) are on the active medication list: yes  Last refill:  10/30/21 12 g 3 RF  Future visit scheduled: no  Notes to clinic:  med not assigned to a protocol   Requested Prescriptions  Pending Prescriptions Disp [place] 2.5-2.5 MCG/ACT AERS [Pharmacy Med Name: STIOLTO RESPIMAT 2.5-2.5 MCG/ACT Aerosol Solution] 12 g 3    Sig: Inhale 2 puffs into the lungs daily.     Off-Protocol Failed - 08/02/2022  2:32 PM      Failed - Medication not assigned to a protocol, review manually.      Passed - Valid encounter within last 12 months    Recent Outpatient Visits           2 weeks ago Centrilobular emphysema ([place])   [place], Grayland Ormond A, RPH-CPP   3 weeks ago Centrilobular emphysema [facility])   [facility] [place], Grayland Ormond A, RPH-CPP   1 month ago Centrilobular emphysema [facility])   [place], Grayland Ormond A, RPH-CPP   1 month ago Centrilobular emphysema ([place])   [place], Grayland Ormond A, RPH-CPP   2 months ago Seasonal allergies   [facility] [place], Dani Gobble, [place]

## 2022-09-16 ENCOUNTER — Ambulatory Visit: Payer: Self-pay | Admitting: *Deleted

## 2022-09-16 NOTE — Telephone Encounter (Signed)
  Chief Complaint: back pain Symptoms: severe lower back pain - radiating into leg Frequency: started Tuesday night- pain so bad today could not go to work Pertinent Negatives: Patient denies neurological symptoms Disposition: '[]'$ ED /'[]'$ Urgent Care (no appt availability in office) / '[]'$ Appointment(In office/virtual)/ '[]'$  [place] [facility]/ '[]'$ Home Care/ '[x]'$ Refused Recommended Disposition /'[]'$ [place] [facility]/ '[]'$  Follow-up with PCP Additional Notes: Refuses ED for severe pain (will not sit in waiting room)- requests office appointment- first available appointment scheduled - patient advised other option- ortho UC  Reason for Disposition  [1] SEVERE back pain (e.g., excruciating) AND [2] sudden onset AND [3] age > 60 years  Answer Assessment - Initial Assessment Questions 1. ONSET: "When did the pain begin?"      Started Tuesday night at work 2. LOCATION: "Where does it hurt?" (upper, mid or lower back)     Lower back- right 3. SEVERITY: "How bad is the pain?"  (e.g., Scale 1-10; mild, moderate, or severe)   - MILD (1-3): Doesn't interfere with normal activities.    - MODERATE (4-7): Interferes with normal activities or awakens from sleep.    - SEVERE (8-10): Excruciating pain, unable to do any normal activities.      severe 4. PATTERN: "Is the pain constant?" (e.g., yes, no; constant, intermittent)      constant 5. RADIATION: "Does the pain shoot into your legs or somewhere else?"     R leg 6. CAUSE:  "What do you think is causing the back pain?"      Possible injury- hx back surgery 7. BACK OVERUSE:  "Any recent lifting of heavy objects, strenuous work or exercise?"     work 8. MEDICINES: "What have you taken so far for the pain?" (e.g., nothing, acetaminophen, NSAIDS)     Naproxen  9. NEUROLOGIC SYMPTOMS: "Do you have any weakness, numbness, or problems with bowel/bladder control?"     no 10. OTHER SYMPTOMS: "Do you have any other symptoms?" (e.g., fever, abdomen pain, burning  with urination, blood in urine)       no  Protocols used: Back Pain-A-AH

## 2022-09-17 ENCOUNTER — Ambulatory Visit (INDEPENDENT_AMBULATORY_CARE_PROVIDER_SITE_OTHER): Payer: Medicare HMO | Admitting: Internal Medicine

## 2022-09-17 ENCOUNTER — Encounter: Payer: Self-pay | Admitting: Internal Medicine

## 2022-09-17 VITALS — BP 136/84 | HR 74 | Temp 96.9°F | Wt 162.0 lb

## 2022-09-17 DIAGNOSIS — M5441 Lumbago with sciatica, right side: Secondary | ICD-10-CM

## 2022-09-17 MED ORDER — METHOCARBAMOL 500 MG PO TABS: 500.0000 mg | ORAL_TABLET | Freq: Four times a day (QID) | ORAL | 0 refills | Status: DC

## 2022-09-17 MED ORDER — KETOROLAC TROMETHAMINE 30 MG/ML IJ SOLN
30.0000 mg | Freq: Once | INTRAMUSCULAR | Status: AC
  Administered 2022-09-17: 30 mg via INTRAMUSCULAR

## 2022-09-17 MED ORDER — PREDNISONE 10 MG PO TABS: ORAL_TABLET | ORAL | 0 refills | Status: DC

## 2022-09-17 NOTE — Patient Instructions (Signed)

## 2022-09-17 NOTE — Addendum Note (Signed)
Addended by: Ashley Royalty E on: 09/17/2022 09:55 AM   Modules accepted: Orders

## 2022-09-17 NOTE — Progress Notes (Signed)
Subjective:    [name], male    DOB: [birth year], 67 y.o.   MRN: [id number]  HPI  Patient presents to clinic today with complaint of low back pain.  This started 4 days ago while at work but worsened last night.  He describes the pain as sharp and shooting.  The pain radiates into his right leg.  He denies numbness, tingling, weakness of the right lower extremity.  He denies loss of bowel or bladder control.  He has tried Naproxen OTC with minimal relief of symptoms.  He has a history of chronic knee and back pain.  He reports history of 2 back surgeries about 10 years ago.  Review of Systems  Past Medical History:  Diagnosis Date   COPD (chronic obstructive pulmonary disease) ([place])    mild   COVID-19 09/2019   Erectile dysfunction    Headache    daily, stress headaches   Knee pain, left    Seasonal allergies    Stroke (HCC)    Tobacco dependence     Current Outpatient Medications  Medication Sig Dispense Refill   albuterol (VENTOLIN HFA) 108 (90 Base) MCG/ACT inhaler INHALE 1 TO 2 PUFFS EVERY 4 HOURS AS NEEDED FOR WHEEZING OR SHORTNESS OF BREATH 1 each 2   aspirin EC 325 MG EC tablet Take 1 tablet (325 mg total) by mouth daily. 90 tablet 0   cholecalciferol (VITAMIN D3) 25 MCG (1000 UNIT) tablet Take 1,000 Units by mouth daily.     fludrocortisone (FLORINEF) 0.1 MG tablet TAKE 1 TABLET (0.1 MG TOTAL) BY MOUTH DAILY. (Patient taking differently: Take 0.1 mg by mouth as needed.) 90 tablet 2   naproxen (NAPROSYN) 500 MG tablet TAKE 1 TABLET (500 MG TOTAL) BY MOUTH 2 (TWO) TIMES DAILY AS NEEDED FOR MILD PAIN OR MODERATE PAIN. 60 tablet 2   rosuvastatin (CRESTOR) 40 MG tablet TAKE 1 TABLET AT BEDTIME . APPOINTMENT IS NEEDED 90 tablet 0   sildenafil (REVATIO) 20 MG tablet TAKE 1 TO 5 TABLETS BY MOUTH ABOUT 30 MINUTES PRIOR TO SEX.START WITH 1 THEN INCREASE 30 tablet 5   STIOLTO RESPIMAT 2.5-2.5 MCG/ACT AERS INHALE 2 PUFFS INTO THE LUNGS DAILY. 12 g 3   vitamin B-12  (CYANOCOBALAMIN) 1000 MCG tablet Take 1 tablet (1,000 mcg total) by mouth daily.     vitamin C (ASCORBIC ACID) 500 MG tablet Take 500 mg by mouth daily. Am     No current facility-administered medications for this visit.    No Known Allergies  Family History  Problem Relation Age of Onset   Diabetes Mother    Heart attack Mother 19   Cancer Father        liver    COPD Brother    HIV/AIDS Brother    Prostate cancer Neg Hx    Colon cancer Neg Hx     Social History   Socioeconomic History   Marital status: Married    Spouse name: [name]   Number of children: 0   Years of education: Not on file   Highest education level: Some college, no degree  Occupational History   Not on file  Tobacco Use   Smoking status: Former    Packs/day: 1.50    Years: 49.00    Total pack years: 73.50    Types: Cigarettes    Start date: 20    Quit date: 09/14/2017    Years since quitting: 5.0   Smokeless tobacco: Former  Media planner  Vaping Use: Never used  Substance and Sexual Activity   Alcohol use: Yes    Comment: 6 pack on weekend   Drug use: No   Sexual activity: Not on file  Other Topics Concern   Not on file  Social History Narrative   Lives at home w wife   L handed   Caffeine: 1 C of coffee AM   Social Determinants of Health   Financial Resource Strain: Low Risk  (10/30/2021)   Overall Financial Resource Strain (CARDIA)    Difficulty of Paying Living Expenses: Not hard at all  Food Insecurity: No Food Insecurity (10/30/2021)   Hunger Vital Sign    Worried About Running Out of Food in the Last Year: Never true    Ran Out of Food in the Last Year: Never true  Transportation Needs: No Transportation Needs (10/30/2021)   PRAPARE - Hydrologist (Medical): No    Lack of Transportation (Non-Medical): No  Physical Activity: Inactive (10/30/2021)   Exercise Vital Sign    Days of Exercise per Week: 0 days    Minutes of Exercise per Session: 0 min   Stress: No Stress Concern Present (10/30/2021)   [place]    Feeling of Stress : Not at all  Social Connections: Moderately Isolated (10/30/2021)   Social Connection and Isolation Panel [NHANES]    Frequency of Communication with Friends and Family: More than three times a week    Frequency of Social Gatherings with Friends and Family: Once a week    Attends Religious Services: Never    Marine scientist or Organizations: No    Attends Archivist Meetings: Never    Marital Status: Married  Human resources officer Violence: Not At Risk (10/30/2021)   Humiliation, Afraid, Rape, and Kick questionnaire    Fear of Current or Ex-Partner: No    Emotionally Abused: No    Physically Abused: No    Sexually Abused: No     Constitutional: Denies fever, malaise, fatigue, headache or abrupt weight changes.  Respiratory: Denies difficulty breathing, shortness of breath, cough or sputum production.   Cardiovascular: Denies chest pain, chest tightness, palpitations or swelling in the hands or feet.  Gastrointestinal: Denies abdominal pain, bloating, constipation, diarrhea or blood in the stool.  GU: Denies urgency, frequency, pain with urination, burning sensation, blood in urine, odor or discharge. Musculoskeletal: Patient reports right-sided low back pain, right leg pain.  Denies decrease in range of motion, difficulty with gait, or joint swelling.  Skin: Denies redness, rashes, lesions or ulcercations.  Neurological: Denies numbness, tingling, weakness or problems with balance and coordination.    No other specific complaints in a complete review of systems (except as listed in HPI above).     Objective:   Physical Exam BP 136/84 (BP Location: Right Arm, Patient Position: Sitting, Cuff Size: Normal)   Pulse 74   Temp (!) 96.9 F (36.1 C) (Temporal)   Wt 162 lb (73.5 kg)   SpO2 99%   BMI 21.97 kg/m   Wt Readings  from Last 3 Encounters:  05/18/22 164 lb 12.8 oz (74.8 kg)  11/03/21 169 lb 12.8 oz (77 kg)  10/30/21 170 lb (77.1 kg)    General: Appears his stated age, well developed, well nourished in NAD. Cardiovascular: Normal rate and rhythm.  Pulmonary/Chest: Normal effort and positive vesicular breath sounds. No respiratory distress. No wheezes, rales or ronchi noted.  Musculoskeletal: Decreased  flexion, extension and rotation of the cervical spine secondary to pain.  No bony tenderness noted over the lumbar spine of the right SI joint.  Strength 5/5 LLE.  Strength 4/5 RLE.  He has difficulty getting from a sitting to a standing position.  Gait slow and steady with a limp but without device. Neurological: Alert and oriented.  Positive SLR on the right at 30 degrees.    BMET    Component Value Date/Time   NA 140 12/23/2021 0800   K 3.9 12/23/2021 0800   CL 109 12/23/2021 0800   CO2 25 12/23/2021 0800   GLUCOSE 111 (H) 12/23/2021 0800   BUN 16 12/23/2021 0800   CREATININE 1.08 12/23/2021 0800   CALCIUM 9.6 12/23/2021 0800   GFRNONAA 74 12/22/2020 0801   GFRAA 85 12/22/2020 0801    Lipid Panel     Component Value Date/Time   CHOL 115 12/23/2021 0800   TRIG 185 (H) 12/23/2021 0800   HDL 45 12/23/2021 0800   CHOLHDL 2.6 12/23/2021 0800   VLDL 23 03/27/2020 0823   LDLCALC 44 12/23/2021 0800    CBC    Component Value Date/Time   WBC 7.5 12/23/2021 0800   RBC 4.83 12/23/2021 0800   HGB 16.0 12/23/2021 0800   HCT 47.9 12/23/2021 0800   PLT 174 12/23/2021 0800   MCV 99.2 12/23/2021 0800   MCH 33.1 (H) 12/23/2021 0800   MCHC 33.4 12/23/2021 0800   RDW 13.2 12/23/2021 0800   LYMPHSABS 2,475 12/23/2021 0800   MONOABS 0.7 04/02/2020 0548   EOSABS 428 12/23/2021 0800   BASOSABS 53 12/23/2021 0800    Hgb A1C Lab Results  Component Value Date   HGBA1C 5.7 (H) 12/23/2021           Assessment & Plan:   Right-Sided Low Back Pain with Right-Sided Sciatica:  Rx for Pred  taper x 9 days Rx for Methocarbamol 500 mg every 8 hours as needed-sedation caution given Stretching exercises given Recommend he alternate heat and ice  Follow-up with your PCP as previously scheduled Webb Silversmith, NP

## 2022-09-24 ENCOUNTER — Other Ambulatory Visit: Payer: Self-pay

## 2022-09-24 ENCOUNTER — Encounter: Payer: Self-pay | Admitting: Emergency Medicine

## 2022-09-24 ENCOUNTER — Emergency Department: Payer: Medicare HMO

## 2022-09-24 ENCOUNTER — Emergency Department
Admission: EM | Admit: 2022-09-24 | Discharge: 2022-09-24 | Disposition: A | Discharge status: Home / Self Care | Payer: Worker's Compensation | Attending: Student in an Organized Health Care Education/Training Program | Admitting: Student in an Organized Health Care Education/Training Program

## 2022-09-24 DIAGNOSIS — X500XXA Overexertion from strenuous movement or load, initial encounter: Secondary | ICD-10-CM | POA: Insufficient documentation

## 2022-09-24 DIAGNOSIS — M5416 Radiculopathy, lumbar region: Secondary | ICD-10-CM | POA: Diagnosis not present

## 2022-09-24 DIAGNOSIS — R0902 Hypoxemia: Secondary | ICD-10-CM | POA: Diagnosis not present

## 2022-09-24 DIAGNOSIS — R0689 Other abnormalities of breathing: Secondary | ICD-10-CM | POA: Diagnosis not present

## 2022-09-24 DIAGNOSIS — Z8616 Personal history of COVID-19: Secondary | ICD-10-CM | POA: Insufficient documentation

## 2022-09-24 DIAGNOSIS — Z87891 Personal history of nicotine dependence: Secondary | ICD-10-CM | POA: Diagnosis not present

## 2022-09-24 DIAGNOSIS — I10 Essential (primary) hypertension: Secondary | ICD-10-CM | POA: Diagnosis not present

## 2022-09-24 DIAGNOSIS — M545 Low back pain, unspecified: Secondary | ICD-10-CM | POA: Diagnosis not present

## 2022-09-24 DIAGNOSIS — M549 Dorsalgia, unspecified: Secondary | ICD-10-CM | POA: Diagnosis not present

## 2022-09-24 MED ORDER — LIDOCAINE 5 % EX PTCH
1.0000 | MEDICATED_PATCH | CUTANEOUS | Status: DC
  Filled 2022-09-24: qty 1

## 2022-09-24 MED ORDER — KETOROLAC TROMETHAMINE 15 MG/ML IJ SOLN
15.0000 mg | Freq: Once | INTRAMUSCULAR | Status: AC
  Administered 2022-09-24: 15 mg via INTRAVENOUS
  Filled 2022-09-24: qty 1

## 2022-09-24 MED ORDER — ACETAMINOPHEN 10 MG/ML IV SOLN
1000.0000 mg | Freq: Once | INTRAVENOUS | Status: AC
  Administered 2022-09-24: 1000 mg via INTRAVENOUS
  Filled 2022-09-24: qty 100

## 2022-09-24 MED ORDER — LIDOCAINE 5 % EX PTCH: 1.0000 | MEDICATED_PATCH | Freq: Two times a day (BID) | CUTANEOUS | 0 refills | Status: AC

## 2022-09-24 MED ORDER — NAPROXEN 500 MG PO TABS: 500.0000 mg | ORAL_TABLET | Freq: Two times a day (BID) | ORAL | 0 refills | Status: DC

## 2022-09-24 MED ORDER — OXYCODONE HCL 5 MG PO TABS: 5.0000 mg | ORAL_TABLET | Freq: Three times a day (TID) | ORAL | 0 refills | Status: DC | PRN

## 2022-09-24 MED ORDER — DEXAMETHASONE SODIUM PHOSPHATE 10 MG/ML IJ SOLN
10.0000 mg | Freq: Once | INTRAMUSCULAR | Status: AC
  Administered 2022-09-24: 10 mg via INTRAVENOUS
  Filled 2022-09-24: qty 1

## 2022-09-24 NOTE — ED Provider Notes (Signed)
[facility] Provider Note    Event Date/Time   First MD Initiated Contact with Patient 09/24/22 1125     (approximate)   History   Back Pain   HPI  Ethan Fitzgerald is a 67 y.o. male with a past medical history of chronic back pain, CVA, hyperlipidemia, chronic pain who presents today for evaluation of right-sided low back pain with radiation to his knee.  Patient reports that he was lifting heavy sandbags 1 week ago, saying that he lifted 30 bags that were 50 pounds each.  He reports that since that time he has had pain in his right low back that radiates down his right leg.  He saw [facility] who scheduled him an MRI which she has not yet had.  Patient reports that his pain is severe and he has been taking the prednisone that was prescribed by his outpatient provider which has not been helping him.  He denies any urinary or fecal incontinence or retention.  Denies saddle anesthesia.  He denies any weakness or numbness in his legs.  He reports that the pain is constant.  No fevers or chills.  EMS gave fentanyl en route.  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) 04/16/2020   Parotid mass 03/31/2020   Carotid stenosis 03/31/2020   Arterial hypotension 03/31/2020   Dysphagia due to recent stroke 03/31/2020   Hypokalemia 03/31/2020   Right shoulder pain 03/31/2020   SAH (subarachnoid hemorrhage) ([place]) secondary to IR 03/31/2020    Middle cerebral artery embolism, right 03/27/2020   History of COVID-19 12/04/2019   Centrilobular emphysema ([place]) 04/03/2019   Seasonal allergies 02/18/2017   Elevated hemoglobin A1c 02/18/2017   Erectile dysfunction 02/18/2017   Hyperlipidemia 02/18/2017   History of hemorrhoids 02/18/2017   Former smoker 02/18/2017   Chronic low back pain 02/18/2017   Chronic pain of left knee 02/18/2017   Tear of meniscus of knee 03/06/2015          Physical Exam   Triage Vital Signs: ED Triage Vitals  Enc Vitals Group     BP      Pulse      Resp      Temp      Temp src      SpO2      Weight      Height      Head Circumference      Peak Flow      Pain Score      Pain Loc      Pain Edu?      Excl. in [place]?     Most recent vital signs: Vitals:   09/24/22 1223  BP: (!) 125/90  Pulse: 72  Resp: 16  Temp: 98 F (36.7 C)  SpO2: 99%    Physical Exam Vitals and nursing note reviewed.  Constitutional:      General: Awake and alert. No acute distress.    Appearance: Normal appearance. The patient is normal weight.  HENT:     Head: Normocephalic  and atraumatic.     Mouth: Mucous membranes are moist.  Eyes:     General: PERRL. Normal EOMs        Right eye: No discharge.        Left eye: No discharge.     Conjunctiva/sclera: Conjunctivae normal.  Cardiovascular:     Rate and Rhythm: Normal rate and regular rhythm.     Pulses: Normal pulses.  Pulmonary:     Effort: Pulmonary effort is normal. No respiratory distress.     Breath sounds: Normal breath sounds.  Abdominal:     Abdomen is soft. There is no abdominal tenderness. No rebound or guarding. No distention. Musculoskeletal:        General: No swelling. Normal range of motion.     Cervical back: Normal range of motion and neck supple.  Back: No midline tenderness.  Right-sided paraspinal muscle tenderness.  Strength and sensation 5/5 to bilateral lower extremities. Normal great toe extension against resistance. Normal  sensation throughout feet. Normal patellar reflexes. Positive SLR on the right and opposite SLR.  Skin:    General: Skin is warm and dry.     Capillary Refill: Capillary refill takes less than 2 seconds.     Findings: No rash.  Neurological:     Mental Status: The patient is awake and alert.      ED Results / Procedures / Treatments   Labs (all labs ordered are listed, but only abnormal results are displayed) Labs Reviewed - No data to display   EKG     RADIOLOGY I independently reviewed and interpreted imaging and agree with radiologists findings.     PROCEDURES:  Critical Care performed:   Procedures   MEDICATIONS ORDERED IN ED: Medications  lidocaine (LIDODERM) 5 % 1 patch (1 patch Transdermal Not Given 09/24/22 1329)  ketorolac (TORADOL) 15 MG/ML injection 15 mg (15 mg Intravenous Given 09/24/22 1225)  dexamethasone (DECADRON) injection 10 mg (10 mg Intravenous Given 09/24/22 1225)  acetaminophen (OFIRMEV) IV 1,000 mg (0 mg Intravenous Stopped 09/24/22 1324)     IMPRESSION / MDM / ASSESSMENT AND PLAN / ED COURSE  I reviewed the triage vital signs and the nursing notes.   Differential diagnosis includes, but is not limited to, lumbar radiculopathy, vertebral fracture, degenerative disease, less likely epidural hematoma or epidural abscess.  I reviewed the patient's chart.  Patient was seen by his primary care provider on 09/17/2022 and was instructed to use prednisone taper, methocarbamol and ice/heat.  He was also given stretching exercises.  This is a 67 year old male with a history of back pain who presents with back pain.  He has 5 out of 5 strength with intact sensation to extensor hallucis dorsiflexion and plantarflexion of bilateral lower extremities with normal patellar reflexes bilaterally. Most likely etiology at this point is muscle strain vs herniated disc especially with the history of lifting heavy exam back to just prior to symptom onset. No red flags  to indicate patient is at risk for more auspicious process to suggest cauda equina or cord compression. No major trauma, no midline tenderness, no history or physical exam findings such as urinary fecal incontinence or retention or saddle anesthesia to suggest cauda equina syndrome or spinal cord compression. No focal neurological deficits on exam. No constitutional symptoms or history of immunosuppression or IVDA to suggest potential for epidural abscess. Not anticoagulated, no history of bleeding diastasis to suggest risk for epidural hematoma. No chronic steroid use or advanced age or history of malignancy to  suggest proclivity towards pathological fracture.  No abdominal pain or flank pain to suggest kidney stone, no history of kidney stone.  No fever or dysuria or CVAT to suggest pyelonephritis .  No chest pain, back pain, shortness of breath, neurological deficits, to suggest vascular catastrophe, and pulses are equal in all 4 extremities.    CT scan obtained reveals disc bulge diffusely and multifactorial moderate bilateral neuroforaminal narrowing.  He has a straight leg raise that is positive on his affected side and his history of lifting heavy sandbags is concerning for a disc herniation.  There are no focal neurological deficits.  He was treated symptomatically with IV analgesia with significant proving of his symptoms.  He was prescribed steroids by his outpatient provider, and I also prescribed small amount of naproxen and oxycodone.  He was advised that the oxycodone is highly addictive and should only be used for breakthrough pain.  He was also advised that he cannot drive, operate heavy machinery, or perform any test that require concentration while taking this medication.  He was also given Lidoderm patches.  He was instructed to not perform any heavy lifting.  I recommended that he follow-up with the neurosurgeon for further management.  Discussed this diagnosis, treatment options, follow-up  recommendations, and return precautions at length with patient and his wife. Recommended close outpatient follow-up for re-evaluation. Patient agrees with plan of care. Will treat the patient symptomatically as needed for pain control. Will discharge patient to take these medications and return for any worsening or different pain or development of any neurologic symptoms. Educated patient regarding expected time course for back pain to improve and recommended very close outpatient follow-up.  Patient is ambulatory at the time of discharge.  He and wife understand and agree with plan.  Discharged in stable condition. Wife is driving.    Patient's presentation is most consistent with acute complicated illness / injury requiring diagnostic workup.     FINAL CLINICAL IMPRESSION(S) / ED DIAGNOSES   Final diagnoses:  Lumbar radiculopathy     Rx / DC Orders   ED Discharge Orders          Ordered    oxyCODONE (ROXICODONE) 5 MG immediate release tablet  Every 8 hours PRN        09/24/22 1303    naproxen (NAPROSYN) 500 MG tablet  2 times daily with meals        09/24/22 1303    lidocaine (LIDODERM) 5 %  Every 12 hours        09/24/22 1303             Note:  This document was prepared using Dragon voice recognition software and may include unintentional dictation errors.   Emeline Gins 09/24/22 1428    Merlyn Lot, MD 09/24/22 [phone]

## 2022-09-24 NOTE — ED Triage Notes (Signed)
Presents via EMS from home with lower back pain  states he developed back pain after lifting heavy sand bags about 1 week ago at work  Has been seen by [facility] this week  but has has gotten worse  Unable to get out of bed  18g in left AC 100 mcg of fentanyl given

## 2022-09-24 NOTE — Discharge Instructions (Addendum)
Please arrange follow-up with the neurosurgeon for continued management of your pain.  You may take the medications as prescribed, in addition to Tylenol 630 mg every 6-8 hours.  Remember that the oxycodone is highly addictive and should only use for breakthrough pain when the alternative medications do not work first.  Also over the you cannot drive, operate heavy machinery, or perform any test that require concentration while taking that medication. Please return to the emergency department for any new, worsening, or changing symptoms or other concerns including weakness in your legs, urinary or stool incontinence or retention, numbness or tingling in your extremities/buttocks/groin, fevers, or any other concerns or change in symptoms.  It was a pleasure caring for you today.

## 2022-09-28 DIAGNOSIS — Z7401 Bed confinement status: Secondary | ICD-10-CM | POA: Diagnosis not present

## 2022-09-28 DIAGNOSIS — W19XXXA Unspecified fall, initial encounter: Secondary | ICD-10-CM | POA: Diagnosis not present

## 2022-09-28 DIAGNOSIS — M549 Dorsalgia, unspecified: Secondary | ICD-10-CM | POA: Diagnosis not present

## 2022-10-01 DIAGNOSIS — M545 Low back pain, unspecified: Secondary | ICD-10-CM | POA: Diagnosis not present

## 2022-10-01 DIAGNOSIS — R5381 Other malaise: Secondary | ICD-10-CM | POA: Diagnosis not present

## 2022-10-01 DIAGNOSIS — R69 Illness, unspecified: Secondary | ICD-10-CM | POA: Diagnosis not present

## 2022-10-01 DIAGNOSIS — Z743 Need for continuous supervision: Secondary | ICD-10-CM | POA: Diagnosis not present

## 2022-10-08 DIAGNOSIS — M549 Dorsalgia, unspecified: Secondary | ICD-10-CM | POA: Diagnosis not present

## 2022-10-08 DIAGNOSIS — Z7401 Bed confinement status: Secondary | ICD-10-CM | POA: Diagnosis not present

## 2022-10-12 ENCOUNTER — Telehealth: Payer: Self-pay | Admitting: Adult Health

## 2022-10-12 ENCOUNTER — Encounter: Payer: Self-pay | Admitting: Neurology

## 2022-10-12 NOTE — Telephone Encounter (Signed)
Pt's wife, Tamika Nou (on [place]) Would like to discuss about getting him an MRI or an appt to see what's going on with him. He is getting worse; dizziness cannot set up, cannot get him to the bathroom. Please call wife, phone: [phone]

## 2022-10-12 NOTE — Telephone Encounter (Signed)
Pt wife is calling. Stated pt is having dizzy spells and can't get out of bed. Stated pt just had epidural on Friday due to some back issue.

## 2022-10-12 NOTE — Telephone Encounter (Signed)
Attempted to call the number and it states "call can not be completed as dialed" unable to reach pt's wife. Will send a mychart message.

## 2022-10-12 NOTE — Telephone Encounter (Signed)
After checking the DPR the message from [place], [place] was left on pt's vm

## 2022-10-18 ENCOUNTER — Ambulatory Visit: Payer: Self-pay | Admitting: *Deleted

## 2022-10-18 NOTE — Telephone Encounter (Signed)
Reason for Disposition  [1] MODERATE dizziness (e.g., interferes with normal activities) AND [2] has NOT been evaluated by doctor (or NP/PA) for this  (Exception: Dizziness caused by heat exposure, sudden standing, or poor fluid intake.)  Answer Assessment - Initial Assessment Questions 1. DESCRIPTION: "Describe your dizziness."     Ethan Fitzgerald calling in.   Wife.   He is having dizziness.   He has fallen once going to the bathroom.    I thought it was the medication.  He came Jan. 11, 2024 and saw [place].    He got a shot in the butt and told him it was sciatica and to go back to work. He has been in the hospital and had CT.   He has a bad disc in his back.  He is not taking his medication anymore.    He had a stroke 2 years ago in July. Every time he gets up he gets real dizzy.   He is using a walker due to the dizziness and the back pain.    I called [facility].    They said he needed to have a referral.     I want a referral for him to be seen there.    He had an epidural and it took a week for it to help.     He was on Tramadol.    He went to [facility] in May but they are telling me he has to have a referral.   He is an established pt.    May 2023. 2. LIGHTHEADED: "Do you feel lightheaded?" (e.g., somewhat faint, woozy, weak upon standing)     Not passed out.     He has trouble walking so the ambulance takes him to [facility] for his back.     3. VERTIGO: "Do you feel like either you or the room is spinning or tilting?" (i.e. vertigo)     No 4. SEVERITY: "How bad is it?"  "Do you feel like you are going to faint?" "Can you stand and walk?"   - MILD: Feels slightly dizzy, but walking normally.   - MODERATE: Feels unsteady when walking, but not falling; interferes with normal activities (e.g., school, work).   - SEVERE: Unable to walk without falling, or requires assistance to walk without falling; feels like passing out now.      Has not passed out     5. ONSET:  "When  did the dizziness begin?"     Started Jan. 18, 2024 6. AGGRAVATING FACTORS: "Does anything make it worse?" (e.g., standing, change in head position)     Getting up from sitting and getting out of bed he gets really dizzy.   7. HEART RATE: "Can you tell me your heart rate?" "How many beats in 15 seconds?"  (Note: not all patients can do this)       Not asked 8. CAUSE: "What do you think is causing the dizziness?"     I don't know.     He has stopped the Tramadol for a week so we know it's not the Tramadol.    9. RECURRENT SYMPTOM: "Have you had dizziness before?" If Yes, ask: "When was the last time?" "What happened that time?"     No       10. OTHER SYMPTOMS: "Do you have any other symptoms?" (e.g., fever, chest pain, vomiting, diarrhea, bleeding)       Started after his back went out.    Since he  has had a stroke I'm worried about him.   11. PREGNANCY: "Is there any chance you are pregnant?" "When was your last menstrual period?"       N/A  Protocols used: Dizziness - Lightheadedness-A-AH

## 2022-10-18 NOTE — Telephone Encounter (Signed)
He did c/o dizziness at prior visits but felt due to orthostatic hypotension, was on Florinef managed by PCP. If this is the same type of dizziness, should be seen by PCP to further discuss this as we do not manage this type of dizziness. If new type of dizziness, per office policy, patient would need new referral by PCP for new symptom to be seen by one of our neurologists for full work up.

## 2022-10-18 NOTE — Telephone Encounter (Signed)
  Chief Complaint: requesting a referral to [facility].   They said he needs a referral. Symptoms: dizziness and back pain Frequency: Since Jan. 2024 Pertinent Negatives: Patient denies passing out but very dizzy with ambulation.   Been going to [facility] for epidurals. Disposition: '[]'$ ED /'[]'$ Urgent Care (no appt availability in office) / '[x]'$ Appointment(In office/virtual)/ '[]'$  [facility]/ '[]'$ Home Care/ '[]'$ Refused Recommended Disposition /'[]'$ [facility]/ '[]'$  Follow-up with PCP Additional Notes: Wife Ethan Fitzgerald called in but husband in background.   Appt. Made with Dr. Parks Fitzgerald for 11:00 10/19/2022.

## 2022-10-19 ENCOUNTER — Ambulatory Visit (INDEPENDENT_AMBULATORY_CARE_PROVIDER_SITE_OTHER): Payer: Medicare HMO | Admitting: Family Medicine

## 2022-10-19 ENCOUNTER — Encounter: Payer: Self-pay | Admitting: Family Medicine

## 2022-10-19 ENCOUNTER — Other Ambulatory Visit: Payer: Self-pay | Admitting: Family Medicine

## 2022-10-19 VITALS — BP 110/70 | HR 83 | Ht 72.0 in | Wt 146.8 lb

## 2022-10-19 DIAGNOSIS — I6930 Unspecified sequelae of cerebral infarction: Secondary | ICD-10-CM

## 2022-10-19 DIAGNOSIS — I69359 Hemiplegia and hemiparesis following cerebral infarction affecting unspecified side: Secondary | ICD-10-CM | POA: Diagnosis not present

## 2022-10-19 DIAGNOSIS — Z0000 Encounter for general adult medical examination without abnormal findings: Secondary | ICD-10-CM

## 2022-10-19 DIAGNOSIS — R7309 Other abnormal glucose: Secondary | ICD-10-CM

## 2022-10-19 DIAGNOSIS — E782 Mixed hyperlipidemia: Secondary | ICD-10-CM

## 2022-10-19 DIAGNOSIS — I951 Orthostatic hypotension: Secondary | ICD-10-CM

## 2022-10-19 DIAGNOSIS — E559 Vitamin D deficiency, unspecified: Secondary | ICD-10-CM

## 2022-10-19 DIAGNOSIS — R42 Dizziness and giddiness: Secondary | ICD-10-CM | POA: Diagnosis not present

## 2022-10-19 DIAGNOSIS — R351 Nocturia: Secondary | ICD-10-CM

## 2022-10-19 NOTE — Progress Notes (Signed)
Subjective:    [name], male    DOB: May 08, 1956, 67 y.o.   MRN: [id number]  Ethan Fitzgerald is a 67 y.o. male presenting on 10/19/2022 for Dizziness   HPI  Postural Dizziness / Orthostatic Hypotension Weight Loss  Onset mid end of January with dizziness, he was seen by Rollene Fare in our office, 09/17/22 and started Methocarbamol and Prednisone  On Tramadol AS NEEDED but that was not causing side effect  [facility] ordered Gabapentin 329m AT NIGHT, and thinks this caused some dizziness.  Prolonged laying or sitting and goes from sit to stand he will have lightheaded, dizzy, sit for few minutes then it resolves.  He does not endorse vertigo or room spinning symptoms  weight loss 20 lbs in 1 month Poor BY MOUTH intake Now 10 days eating more regular Now has Ensure in AM Increased eating regular meals Taking Vitamins  Previously Per Dr GRockey Siturecommendations to maintain BP 130+, instead of 100-110. Was placed on Florinef 0.129mdaily but not taking regularly.  Last dose he took Florinef 2 days ago and it did seem to help. Today is 1st day out of house now due to dizziness  [facility] Needs referral to return if believed to be Neurological  Lumbar Radiculopathy Herinated Disc Last seen by ReWebb SilversmithFNP on 09/17/22 , given toradol, methocarbamol, and prednisone taper, inadequate response, went to hospital 09/24/22 and had imaging and further work up, now seen by [facility] Dr CaKayleen Memosor further management of lumbar back. - Awaiting further management consider surgery or other options.       10/19/2022   11:22 AM 05/18/2022    8:18 AM 11/03/2021    8:38 AM  Depression screen PHQ 2/9  Decreased Interest 0 0 0  Down, Depressed, Hopeless 0 0 0  PHQ - 2 Score 0 0 0  Altered sleeping 0 0 0  Tired, decreased energy 2 0 0  Change in appetite 0 0 0  Feeling bad or failure about yourself  0 0 0   Trouble concentrating 0 0 0  Moving slowly or fidgety/restless 0 0 0  Suicidal thoughts 0 0 0  PHQ-9 Score 2 0 0  Difficult doing work/chores Not difficult at all Not difficult at all Not difficult at all    Social History   Tobacco Use   Smoking status: Former    Packs/day: 1.50    Years: 49.00    Total pack years: 73.50    Types: Cigarettes    Start date: 1920  Quit date: 09/14/2017    Years since quitting: 5.0   Smokeless tobacco: Former  VaScientific laboratory technicianse: Never used  Substance Use Topics   Alcohol use: Yes    Comment: 6 pack on weekend   Drug use: No    Review of Systems Per HPI unless specifically indicated above     Objective:    BP 110/70   Pulse 83   Ht 6' (1.829 m)   Wt 146 lb 12.8 oz (66.6 kg)   SpO2 99%   BMI 19.91 kg/m   Wt Readings from Last 3 Encounters:  10/19/22 146 lb 12.8 oz (66.6 kg)  09/24/22 163 lb 2.3 oz (74 kg)  09/17/22 162 lb (73.5 kg)    Physical Exam Vitals and nursing note reviewed.  Constitutional:      General: He is not in acute distress.  Appearance: He is well-developed. He is not diaphoretic.     Comments: Well-appearing, comfortable, cooperative  HENT:     Head: Normocephalic and atraumatic.  Eyes:     General:        Right eye: No discharge.        Left eye: No discharge.     Conjunctiva/sclera: Conjunctivae normal.  Neck:     Thyroid: No thyromegaly.  Cardiovascular:     Rate and Rhythm: Normal rate and regular rhythm.     Pulses: Normal pulses.     Heart sounds: Normal heart sounds. No murmur heard. Pulmonary:     Effort: Pulmonary effort is normal. No respiratory distress.     Breath sounds: Normal breath sounds. No wheezing or rales.  Musculoskeletal:        General: Normal range of motion.     Cervical back: Normal range of motion and neck supple.  Lymphadenopathy:     Cervical: No cervical adenopathy.  Skin:    General: Skin is warm and dry.     Findings: No erythema or rash.  Neurological:      Mental Status: He is alert and oriented to person, place, and time. Mental status is at baseline.  Psychiatric:        Behavior: Behavior normal.     Comments: Well groomed, good eye contact, normal speech and thoughts      Results for orders placed or performed in visit on 12/23/21  VITAMIN D 25 Hydroxy (Vit-D Deficiency, Fractures)  Result Value Ref Range   Vit D, 25-Hydroxy 46 30 - 100 ng/mL  TSH  Result Value Ref Range   TSH 1.90 0.40 - 4.50 mIU/L  PSA  Result Value Ref Range   PSA 0.65 < OR = 4.00 ng/mL  Hemoglobin A1c  Result Value Ref Range   Hgb A1c MFr Bld 5.7 (H) <5.7 % of total Hgb   Mean Plasma Glucose 117 mg/dL   eAG (mmol/L) 6.5 mmol/L  CBC with Differential/Platelet  Result Value Ref Range   WBC 7.5 3.8 - 10.8 Thousand/uL   RBC 4.83 4.20 - 5.80 Million/uL   Hemoglobin 16.0 13.2 - 17.1 g/dL   HCT 47.9 38.5 - 50.0 %   MCV 99.2 80.0 - 100.0 fL   MCH 33.1 (H) 27.0 - 33.0 pg   MCHC 33.4 32.0 - 36.0 g/dL   RDW 13.2 11.0 - 15.0 %   Platelets 174 140 - 400 Thousand/uL   MPV 12.5 7.5 - 12.5 fL   Neutro Abs 3,960 1,500 - 7,800 cells/uL   Lymphs Abs 2,475 850 - 3,900 cells/uL   Absolute Monocytes 585 200 - 950 cells/uL   Eosinophils Absolute 428 15 - 500 cells/uL   Basophils Absolute 53 0 - 200 cells/uL   Neutrophils Relative % 52.8 %   Total Lymphocyte 33.0 %   Monocytes Relative 7.8 %   Eosinophils Relative 5.7 %   Basophils Relative 0.7 %  Lipid panel  Result Value Ref Range   Cholesterol 115 <200 mg/dL   HDL 45 > OR = 40 mg/dL   Triglycerides 185 (H) <150 mg/dL   LDL Cholesterol (Calc) 44 mg/dL (calc)   Total CHOL/HDL Ratio 2.6 <5.0 (calc)   Non-HDL Cholesterol (Calc) 70 <130 mg/dL (calc)  COMPLETE METABOLIC PANEL WITH GFR  Result Value Ref Range   Glucose, Bld 111 (H) 65 - 99 mg/dL   BUN 16 7 - 25 mg/dL   Creat 1.08 0.70 - 1.35 mg/dL  eGFR 75 > OR = 60 mL/min/1.67m   BUN/Creatinine Ratio NOT APPLICABLE 6 - 22 (calc)   Sodium 140 135 - 146  mmol/L   Potassium 3.9 3.5 - 5.3 mmol/L   Chloride 109 98 - 110 mmol/L   CO2 25 20 - 32 mmol/L   Calcium 9.6 8.6 - 10.3 mg/dL   Total Protein 6.2 6.1 - 8.1 g/dL   Albumin 4.0 3.6 - 5.1 g/dL   Globulin 2.2 1.9 - 3.7 g/dL (calc)   AG Ratio 1.8 1.0 - 2.5 (calc)   Total Bilirubin 1.1 0.2 - 1.2 mg/dL   Alkaline phosphatase (APISO) 56 35 - 144 U/L   AST 19 10 - 35 U/L   ALT 18 9 - 46 U/L      Assessment & Plan:   Problem List Items Addressed This Visit     Arterial hypotension - Primary   Relevant Orders   Ambulatory referral to Neurology   Hemiplegia of dominant side following cerebrovascular accident (CVA) ([place]   Relevant Orders   Ambulatory referral to Neurology   History of cerebrovascular accident (CVA) with residual deficit   Relevant Orders   Ambulatory referral to Neurology   Other Visit Diagnoses     Postural dizziness       Relevant Orders   Ambulatory referral to Neurology       Likely postural / orthostatic given clinical history and exam and scenario of dizziness No evidence of vertigo Non focal neurological Wt loss 20 lbs in month approx, poor BY MOUTH intake previously  He was intermittent rarely taking Florinef. Previously rec by cardiology, to keep BP >130  To start, I recommend taking the Florinef 0.170mdaily now, don't skip a dose. See how the BP goes and it should reduce some of the dizzy when you stand.  Be mindful when standing or changing position quickly. Discussed normal mechanism with standing and position change.  Try to keep on the Ensure daily and improve intake overall, goal to gain some lbs back to see if we can improve your BP / and dizziness.   [facility] on the dizziness, / low BP issue.  postural dizziness, follow-up if not improving with current treatment plan, suspected orthostatic hypotension / weight loss / volume related, he is increasing frequency of now taking Florinef daily,  improving intake for prevent further weight loss. Referral to neuro if patient not improved and interested in 2nd opinion to rule out any other neurological cause.   Orders Placed This Encounter  Procedures   Ambulatory referral to Neurology    Referral Priority:   Routine    Referral Type:   Consultation    Referral Reason:   Specialty Services Required    Requested Specialty:   Neurology    Number of Visits Requested:   1     No orders of the defined types were placed in this encounter.     Follow up plan: Return in about 2 months (around 12/18/2022) for 2 month fasting lab only then 1 week later Annual Physical.  Future labs ordered for 12/22/22   AlNobie PutnamDORonanroup 10/19/2022, 11:23 AM

## 2022-10-19 NOTE — Patient Instructions (Addendum)
Thank you for coming to the office today.  Referral back to [facility] for a 2nd opinion on the dizziness, / low BP issue.  To start, I recommend taking the Florinef 0.18m daily now, don't skip a dose. See how the BP goes and it should reduce some of the dizzy when you stand.  Be mindful when standing or changing position quickly.   I believe the 20 lbs wt loss recently is contributing.  Try to keep on the Ensure daily and improve intake overall, goal to gain some lbs back to see if we can improve your BP / and dizziness.   DUE for FASTING BLOOD WORK (no food or drink after midnight before the lab appointment, only water or coffee without cream/sugar on the morning of)  SCHEDULE "Lab Only" visit in the morning at the clinic for lab draw in 2 MONTHS   - Make sure Lab Only appointment is at about 1 week before your next appointment, so that results will be available  For Lab Results, once available within 2-3 days of blood draw, you can can log in to MyChart online to view your results and a brief explanation. Also, we can discuss results at next follow-up visit.   Please schedule a Follow-up Appointment to: Return in about 2 months (around 12/18/2022) for 2 month fasting lab only then 1 week later Annual Physical.  If you have any other questions or concerns, please feel free to call the office or send a message through [place] You may also schedule an earlier appointment if necessary.  Additionally, you may be receiving a survey about your experience at our office within a few days to 1 week by e-mail or mail. We value your feedback.  ANobie Putnam DO [place]

## 2022-10-20 ENCOUNTER — Other Ambulatory Visit: Payer: Self-pay | Admitting: Family Medicine

## 2022-10-20 DIAGNOSIS — G8929 Other chronic pain: Secondary | ICD-10-CM

## 2022-10-20 NOTE — Telephone Encounter (Signed)
Requested Prescriptions  Pending Prescriptions Disp Refills   naproxen (NAPROSYN) 500 MG tablet [Pharmacy Med Name: NAPROXEN 500 MG TABLET] 60 tablet 0    Sig: TAKE 1 TABLET (500 MG TOTAL) BY MOUTH 2 (TWO) TIMES DAILY AS NEEDED FOR MILD PAIN OR MODERATE PAIN.     Analgesics:  NSAIDS Failed - 10/20/2022  2:47 AM      Failed - Manual Review: Labs are only required if the patient has taken medication for more than 8 weeks.      Passed - Cr in normal range and within 360 days    Creat  Date Value Ref Range Status  12/23/2021 1.08 0.70 - 1.35 mg/dL Final         Passed - HGB in normal range and within 360 days    Hemoglobin  Date Value Ref Range Status  12/23/2021 16.0 13.2 - 17.1 g/dL Final         Passed - PLT in normal range and within 360 days    Platelets  Date Value Ref Range Status  12/23/2021 174 140 - 400 Thousand/uL Final         Passed - HCT in normal range and within 360 days    HCT  Date Value Ref Range Status  12/23/2021 47.9 38.5 - 50.0 % Final         Passed - eGFR is 30 or above and within 360 days    GFR, Est African American  Date Value Ref Range Status  12/22/2020 85 > OR = 60 mL/min/1.63m Final   GFR, Est Non African American  Date Value Ref Range Status  12/22/2020 74 > OR = 60 mL/min/1.743mFinal   eGFR  Date Value Ref Range Status  12/23/2021 75 > OR = 60 mL/min/1.7394minal    Comment:    The eGFR is based on the CKD-EPI 2021 equation. To calculate  the new eGFR from a previous Creatinine or Cystatin C result, go to https://www.kidney.org/professionals/ kdoqi/gfr%5Fcalculator          Passed - Patient is not pregnant      Passed - Valid encounter within last 12 months    Recent Outpatient Visits           Yesterday Orthostatic hypotension   [facility]   1 month ago Acute right-sided low back pain with right-sided sciatica   [facility]   3 months [facility]   3 months [facility] R[facility]CPP   3 months [facility]       Future Appointments             [facility] 2 [facility]

## 2022-11-05 ENCOUNTER — Ambulatory Visit (INDEPENDENT_AMBULATORY_CARE_PROVIDER_SITE_OTHER): Payer: Medicare HMO

## 2022-11-05 VITALS — BP 80/50 | Ht 72.0 in | Wt 149.4 lb

## 2022-11-05 DIAGNOSIS — Z0000 Encounter for general adult medical examination without abnormal findings: Secondary | ICD-10-CM

## 2022-11-05 NOTE — Progress Notes (Signed)
Subjective:   Ethan Fitzgerald is a 67 y.o. male who presents for Medicare Annual/Subsequent preventive examination.  Review of Systems     Cardiac Risk Factors include: advanced age (>37mn, >>71women);dyslipidemia;male gender     Objective:    Today's Vitals   11/05/22 0817 11/05/22 0818  BP: (!) 80/50   Weight: 149 lb 6.4 oz (67.8 kg)   Height: 6' (1.829 m)   PainSc:  6    Body mass index is 20.26 kg/m.     11/05/2022    8:23 AM 09/24/2022   11:27 AM 10/30/2021    8:28 AM 07/26/2021   12:57 PM 10/07/2020    3:22 PM 05/08/2020   11:53 AM 04/07/2020   10:00 PM  Advanced Directives  Does Patient Have a Medical Advance Directive? No No No No Yes Yes   Type of APersonnel officerLiving will    Does patient want to make changes to medical advance directive?       No - Patient declined  [place]?     No - copy requested    Would patient like information on creating a medical advance directive? No - Patient declined No - Patient declined No - Patient declined        Current Medications (verified) Outpatient Encounter Medications as of 11/05/2022  Medication Sig   albuterol (VENTOLIN HFA) 108 (90 Base) MCG/ACT inhaler INHALE 1 TO 2 PUFFS EVERY 4 HOURS AS NEEDED FOR WHEEZING OR SHORTNESS OF BREATH   aspirin EC 325 MG EC tablet Take 1 tablet (325 mg total) by mouth daily.   cholecalciferol (VITAMIN D3) 25 MCG (1000 UNIT) tablet Take 1,000 Units by mouth daily.   naproxen (NAPROSYN) 500 MG tablet Take 1 tablet (500 mg total) by mouth 2 (two) times daily with a meal.   STIOLTO RESPIMAT 2.5-2.5 MCG/ACT AERS INHALE 2 PUFFS INTO THE LUNGS DAILY.   vitamin B-12 (CYANOCOBALAMIN) 1000 MCG tablet Take 1 tablet (1,000 mcg total) by mouth daily.   vitamin C (ASCORBIC ACID) 500 MG tablet Take 500 mg by mouth daily. Am   fludrocortisone (FLORINEF) 0.1 MG tablet TAKE 1 TABLET (0.1 MG TOTAL) BY MOUTH DAILY. (Patient not taking:  Reported on 11/05/2022)   methocarbamol (ROBAXIN) 500 MG tablet Take 1 tablet (500 mg total) by mouth 4 (four) times daily. (Patient not taking: Reported on 11/05/2022)   rosuvastatin (CRESTOR) 40 MG tablet TAKE 1 TABLET AT BEDTIME . APPOINTMENT IS NEEDED   sildenafil (REVATIO) 20 MG tablet TAKE 1 TO 5 TABLETS BY MOUTH ABOUT 30 MINUTES PRIOR TO SEX.START WITH 1 THEN INCREASE   [DISCONTINUED] naproxen (NAPROSYN) 500 MG tablet TAKE 1 TABLET (500 MG TOTAL) BY MOUTH 2 (TWO) TIMES DAILY AS NEEDED FOR MILD PAIN OR MODERATE PAIN.   [DISCONTINUED] oxyCODONE (ROXICODONE) 5 MG immediate release tablet Take 1 tablet (5 mg total) by mouth every 8 (eight) hours as needed.   No facility-administered encounter medications on file as of 11/05/2022.    Allergies (verified) Patient has no known allergies.   History: Past Medical History:  Diagnosis Date   COPD (chronic obstructive pulmonary disease) ([place]    mild   COVID-19 09/2019   Erectile dysfunction    Headache    daily, stress headaches   Knee pain, left    Seasonal allergies    Stroke ([facility]    Tobacco dependence    Past Surgical History:  Procedure  Laterality Date   BACK SURGERY     metal plate in back   COLONOSCOPY     COLONOSCOPY WITH PROPOFOL N/A 04/26/2019   Procedure: COLONOSCOPY WITH PROPOFOL;  Surgeon: Jonathon Bellows, MD;  Location[facility] ENDOSCOPY;  Service: Gastroenterology;  Laterality: N/A;   HERNIA REPAIR Right    IR ANGIO INTRA EXTRACRAN SEL COM CAROTID INNOMINATE UNI L MOD SED  03/27/2020   IR ANGIO VERTEBRAL SEL SUBCLAVIAN INNOMINATE UNI L MOD SED  03/27/2020   IR ANGIO VERTEBRAL SEL VERTEBRAL UNI R MOD SED  03/27/2020   IR CT HEAD LTD  03/27/2020   IR PERCUTANEOUS ART THROMBECTOMY/INFUSION INTRACRANIAL INC DIAG ANGIO  03/27/2020   KNEE ARTHROSCOPY Left 03/28/2015   Procedure: Arthroscopic partial medial meniscectomy plus chondral debridement;  Surgeon: Leanor Kail, MD;  Location: [facility];  Service: Orthopedics;   Laterality: Left;   RADIOLOGY WITH ANESTHESIA N/A 03/27/2020   Procedure: IR WITH ANESTHESIA;  Surgeon: Luanne Bras, MD;  Location: [facility];  Service: Radiology;  Laterality: N/A;   Family History  Problem Relation Age of Onset   Diabetes Mother    Heart attack Mother 62   Cancer Father        liver    COPD Brother    HIV/AIDS Brother    Prostate cancer Neg Hx    Colon cancer Neg Hx    Social History   Socioeconomic History   Marital status: Married    Spouse name: [name]   Number of children: 0   Years of education: Not on file   Highest education level: Some college, no degree  Occupational History   Not on file  Tobacco Use   Smoking status: Former    Packs/day: 1.50    Years: 49.00    Total pack years: 73.50    Types: Cigarettes    Start date: 89    Quit date: 09/14/2017    Years since quitting: 5.1   Smokeless tobacco: Former  Scientific laboratory technician Use: Never used  Substance and Sexual Activity   Alcohol use: Yes    Comment: 6 pack on weekend   Drug use: No   Sexual activity: Not on file  Other Topics Concern   Not on file  Social History Narrative   Lives at home w wife   L handed   Caffeine: 1 C of coffee AM   Social Determinants of Health   Financial Resource Strain: Low Risk  (11/05/2022)   Overall Financial Resource Strain (CARDIA)    Difficulty of Paying Living Expenses: Not hard at all  Food Insecurity: No Food Insecurity (11/05/2022)   Hunger Vital Sign    Worried About Running Out of Food in the Last Year: Never true    [place] in the Last Year: Never true  Transportation Needs: No Transportation Needs (11/05/2022)   PRAPARE - Hydrologist (Medical): No    Lack of Transportation (Non-Medical): No  Physical Activity: Insufficiently Active (11/05/2022)   Exercise Vital Sign    Days of Exercise per Week: 3 days    Minutes of Exercise per Session: 20 min  Stress: No Stress Concern Present (11/05/2022)   [place]    Feeling of Stress : Not at all  Social Connections: Socially Isolated (11/05/2022)   Social Connection and Isolation Panel [NHANES]    Frequency of Communication with Friends and Family: Once a week  Frequency of Social Gatherings with Friends and Family: Never    Attends Religious Services: Never    Marine scientist or Organizations: No    Attends Music therapist: Never    Marital Status: Married    Tobacco Counseling Counseling given: Not Answered   Clinical Intake:  Pre-visit preparation completed: Yes  Pain : 0-10 Pain Score: 6  Pain Type: Chronic pain Pain Location: Back     Nutritional Risks: None Diabetes: No  How often do you need to have someone help you when you read instructions, pamphlets, or other written materials from your doctor or pharmacy?: 1 - Never  Diabetic?no  Interpreter Needed?: No  Information entered by :: Kirke Shaggy, LPN   Activities of Daily Living    11/05/2022    8:24 AM  In your present state of health, do you have any difficulty performing the following activities:  Hearing? 0  Vision? 0  Difficulty concentrating or making decisions? 0  Walking or climbing stairs? 0  Dressing or bathing? 0  Doing errands, shopping? 0  Preparing Food and eating ? N  Using the Toilet? N  In the past six months, have you accidently leaked urine? N  Do you have problems with loss of bowel control? N  Managing your Medications? N  Managing your Finances? N  Housekeeping or managing your Housekeeping? N    Patient Care Team: Olin Hauser, DO as PCP - General (Family Medicine) Garvin Fila, MD as Referring Physician (Neurology)  Indicate any recent Medical Services you may have received from other than [facility] providers in the past year (date may be approximate).     Assessment:   This is a routine wellness examination for  Em[place]ae.  Hearing/Vision screen Hearing Screening - Comments:: No aids Vision Screening - Comments:: [facility]- [facility]  Dietary issues and exercise activities discussed: Current Exercise Habits: Home exercise routine, Type of exercise: stretching, Time (Minutes): 20, Frequency (Times/Week): 7, Weekly Exercise (Minutes/Week): 140, Intensity: Mild   Goals Addressed             This Visit's Progress    DIET - INCREASE WATER INTAKE         Depression Screen    11/05/2022    8:21 AM 10/19/2022   11:22 AM 05/18/2022    8:18 AM 11/03/2021    8:38 AM 10/30/2021    8:27 AM 07/10/2021   11:05 AM 12/29/2020    8:05 AM  PHQ 2/9 Scores  PHQ - 2 Score 0 0 0 0 0 0 0  PHQ- 9 Score 0 2 0 0  0 1    Fall Risk    11/05/2022    8:24 AM 10/19/2022   11:22 AM 05/18/2022    8:18 AM 11/03/2021    8:38 AM 10/30/2021    8:29 AM  Fall Risk   Falls in the past year? 0 0 0 0 0  Number falls in past yr: 0 0 0 0 0  Injury with Fall? 0 0 0 0 0  Risk for fall due to : No Fall Risks No Fall Risks No Fall Risks No Fall Risks No Fall Risks  Follow up Falls prevention discussed;Falls evaluation completed Falls evaluation completed Falls evaluation completed Falls evaluation completed Falls evaluation completed    FALL RISK PREVENTION PERTAINING TO THE HOME:  Any stairs in or around the home? Yes  If so, are there any without handrails? No  Home free of loose throw rugs  in walkways, pet beds, electrical cords, etc? Yes  Adequate lighting in your home to reduce risk of falls? Yes   ASSISTIVE DEVICES UTILIZED TO PREVENT FALLS:  Life alert? No  Use of a cane, walker or w/c? No  Grab bars in the bathroom? No  Shower chair or bench in shower? Yes  Elevated toilet seat or a handicapped toilet? No   TIMED UP AND GO:  Was the test performed? Yes .  Length of time to ambulate 10 feet: 4 sec.   Gait steady and fast without use of assistive device  Cognitive Function:        11/05/2022    8:28 AM  10/07/2020    3:25 PM  6CIT Screen  What Year? 0 points 0 points  What month? 0 points 0 points  What time? 0 points 0 points  Count back from 20 0 points 0 points  Months in reverse 0 points 0 points  Repeat phrase 0 points 2 points  Total Score 0 points 2 points    Immunizations Immunization History  Administered Date(s) Administered   Influenza,inj,Quad PF,6+ Mos 06/22/2018, 06/30/2019   PFIZER(Purple Top)SARS-COV-2 Vaccination 12/29/2019, 01/19/2020   Pneumococcal Conjugate-13 04/03/2019   Tdap 03/18/2017    TDAP status: Up to date  Flu Vaccine status: Declined, Education has been provided regarding the importance of this vaccine but patient still declined. Advised may receive this vaccine at local pharmacy or Health Dept. Aware to provide a copy of the vaccination record if obtained from local pharmacy or Health Dept. Verbalized acceptance and understanding.  Pneumococcal vaccine status: Up to date  Covid-19 vaccine status: Completed vaccines  Qualifies for Shingles Vaccine? Yes   Zostavax completed No   Shingrix Completed?: No.    Education has been provided regarding the importance of this vaccine. Patient has been advised to call insurance company to determine out of pocket expense if they have not yet received this vaccine. Advised may also receive vaccine at local pharmacy or Health Dept. Verbalized acceptance and understanding.  Screening Tests Health Maintenance  Topic Date Due   Zoster Vaccines- Shingrix (1 of 2) Never done   Pneumonia Vaccine 48+ Years old (2 of 2 - PPSV23 or PCV20) 05/29/2019   COLONOSCOPY (Pts 45-59yr Insurance coverage will need to be confirmed)  04/25/2020   Lung Cancer Screening  12/18/2020   COVID-19 Vaccine (3 - 2023-24 season) 05/07/2022   INFLUENZA VACCINE  12/05/2022 (Originally 04/06/2022)   Medicare Annual Wellness (AWV)  11/05/2023   DTaP/Tdap/Td (2 - Td or Tdap) 03/19/2027   Hepatitis C Screening  Completed   HPV VACCINES  Aged  Out    Health Maintenance  Health Maintenance Due  Topic Date Due   Zoster Vaccines- Shingrix (1 of 2) Never done   Pneumonia Vaccine 69 Years old (2 of 2 - PPSV23 or PCV20) 05/29/2019   COLONOSCOPY (Pts 45-446yrInsurance coverage will need to be confirmed)  04/25/2020   Lung Cancer Screening  12/18/2020   COVID-19 Vaccine (3 - 2023-24 season) 05/07/2022    Colorectal cancer screening: Type of screening: Colonoscopy. Completed 04/26/19. Repeat every 1 years-pt states will have soon possibly if Dr.K okays it  Lung Cancer Screening: (Low Dose CT Chest recommended if Age 69-80ears, 30 pack-year currently smoking OR have quit w/in 15years.) does not qualify.   Additional Screening:  Hepatitis C Screening: does qualify; Completed 03/27/19  Vision Screening: Recommended annual ophthalmology exams for early detection of glaucoma and other disorders of the eye.  Is the patient up to date with their annual eye exam?  Yes  Who is the provider or what is the name of the office in which the patient attends annual eye exams? [facility] If pt is not established with a provider, would they like to be referred to a provider to establish care? No .   Dental Screening: Recommended annual dental exams for proper oral hygiene  Community Resource Referral / Chronic Care Management: CRR required this visit?  No   CCM required this visit?  No      Plan:     I have personally reviewed and noted the following in the patient's chart:   Medical and social history Use of alcohol, tobacco or illicit drugs  Current medications and supplements including opioid prescriptions. Patient is not currently taking opioid prescriptions. Functional ability and status Nutritional status Physical activity Advanced directives List of other physicians Hospitalizations, surgeries, and ER visits in previous 12 months Vitals Screenings to include cognitive, depression, and falls Referrals and  appointments  In addition, I have reviewed and discussed with patient certain preventive protocols, quality metrics, and best practice recommendations. A written personalized care plan for preventive services as well as general preventive health recommendations were provided to patient.     Dionisio David, LPN   [id number]   Nurse Notes: none

## 2022-11-05 NOTE — Patient Instructions (Signed)
[name] , Thank you for taking time to come for your Medicare Wellness Visit. I appreciate your ongoing commitment to your health goals. Please review the following plan we discussed and let me know if I can assist you in the future.   These are the goals we discussed:  Goals      DIET - EAT MORE FRUITS AND VEGETABLES     DIET - INCREASE WATER INTAKE     Patient Stated     10/07/2020, no goals     Pharmacy Goals     Please keep in touch with [facility] COPD Inhaler Support Program [phone]) to maintain enrollment.  Please call me if you have any further medication questions/concerns.  Thank you!  Wallace Cullens, PharmD, Para [place], CPP Clinical Pharmacist [facility] [phone]      Quit Smoking     Quit smoking / using tobacco        This is a list of the screening recommended for you and due dates:  Health Maintenance  Topic Date Due   Zoster (Shingles) Vaccine (1 of 2) Never done   Pneumonia Vaccine (2 of 2 - PPSV23 or PCV20) 05/29/2019   Colon Cancer Screening  04/25/2020   Screening for Lung Cancer  12/18/2020   COVID-19 Vaccine (3 - 2023-24 season) 05/07/2022   Flu Shot  12/05/2022*   Medicare Annual Wellness Visit  11/05/2023   DTaP/Tdap/Td vaccine (2 - Td or Tdap) 03/19/2027   Hepatitis C Screening: USPSTF Recommendation to screen - Ages 18-79 yo.  Completed   HPV Vaccine  Aged Out  *Topic was postponed. The date shown is not the original due date.    Advanced directives: no  Conditions/risks identified: none  Next appointment: Follow up in one year for your annual wellness visit. 11/11/23 @ 8:15 am in person  Preventive Care 65 Years and Older, Male  Preventive care refers to lifestyle choices and visits with your health care provider that can promote health and wellness. What does preventive care include? A yearly physical exam. This is also called an annual well check. Dental exams once or twice a year. Routine eye exams.  Ask your health care provider how often you should have your eyes checked. Personal lifestyle choices, including: Daily care of your teeth and gums. Regular physical activity. Eating a healthy diet. Avoiding tobacco and drug use. Limiting alcohol use. Practicing safe sex. Taking low doses of aspirin every day. Taking vitamin and mineral supplements as recommended by your health care provider. What happens during an annual well check? The services and screenings done by your health care provider during your annual well check will depend on your age, overall health, lifestyle risk factors, and family history of disease. Counseling  Your health care provider may ask you questions about your: Alcohol use. Tobacco use. Drug use. Emotional well-being. Home and relationship well-being. Sexual activity. Eating habits. History of falls. Memory and ability to understand (cognition). Work and work Statistician. Screening  You may have the following tests or measurements: Height, weight, and BMI. Blood pressure. Lipid and cholesterol levels. These may be checked every 5 years, or more frequently if you are over 45 years old. Skin check. Lung cancer screening. You may have this screening every year starting at age 9 if you have a 30-pack-year history of smoking and currently smoke or have quit within the past 15 years. Fecal occult blood test (FOBT) of the stool. You may have this test every year starting  at age 67. Flexible sigmoidoscopy or colonoscopy. You may have a sigmoidoscopy every 5 years or a colonoscopy every 10 years starting at age 72. Prostate cancer screening. Recommendations will vary depending on your family history and other risks. Hepatitis C blood test. Hepatitis B blood test. Sexually transmitted disease (STD) testing. Diabetes screening. This is done by checking your blood sugar (glucose) after you have not eaten for a while (fasting). You may have this done every 1-3  years. Abdominal aortic aneurysm (AAA) screening. You may need this if you are a current or former smoker. Osteoporosis. You may be screened starting at age 29 if you are at high risk. Talk with your health care provider about your test results, treatment options, and if necessary, the need for more tests. Vaccines  Your health care provider may recommend certain vaccines, such as: Influenza vaccine. This is recommended every year. Tetanus, diphtheria, and acellular pertussis (Tdap, Td) vaccine. You may need a Td booster every 10 years. Zoster vaccine. You may need this after age 73. Pneumococcal 13-valent conjugate (PCV13) vaccine. One dose is recommended after age 77. Pneumococcal polysaccharide (PPSV23) vaccine. One dose is recommended after age 37. Talk to your health care provider about which screenings and vaccines you need and how often you need them. This information is not intended to replace advice given to you by your health care provider. Make sure you discuss any questions you have with your health care provider. Document Released: 09/19/2015 Document Revised: 05/12/2016 Document Reviewed: 06/24/2015 Elsevier Interactive Patient Education  2017 [employer] Prevention in the Home Falls can cause injuries. They can happen to people of all ages. There are many things you can do to make your home safe and to help prevent falls. What can I do on the outside of my home? Regularly fix the edges of walkways and driveways and fix any cracks. Remove anything that might make you trip as you walk through a door, such as a raised step or threshold. Trim any bushes or trees on the path to your home. Use bright outdoor lighting. Clear any walking paths of anything that might make someone trip, such as rocks or tools. Regularly check to see if handrails are loose or broken. Make sure that both sides of any steps have handrails. Any raised decks and porches should have guardrails on the  edges. Have any leaves, snow, or ice cleared regularly. Use sand or salt on walking paths during winter. Clean up any spills in your garage right away. This includes oil or grease spills. What can I do in the bathroom? Use night lights. Install grab bars by the toilet and in the tub and shower. Do not use towel bars as grab bars. Use non-skid mats or decals in the tub or shower. If you need to sit down in the shower, use a plastic, non-slip stool. Keep the floor dry. Clean up any water that spills on the floor as soon as it happens. Remove soap buildup in the tub or shower regularly. Attach bath mats securely with double-sided non-slip rug tape. Do not have throw rugs and other things on the floor that can make you trip. What can I do in the bedroom? Use night lights. Make sure that you have a light by your bed that is easy to reach. Do not use any sheets or blankets that are too big for your bed. They should not hang down onto the floor. Have a firm chair that has side arms. You  can use this for support while you get dressed. Do not have throw rugs and other things on the floor that can make you trip. What can I do in the kitchen? Clean up any spills right away. Avoid walking on wet floors. Keep items that you use a lot in easy-to-reach places. If you need to reach something above you, use a strong step stool that has a grab bar. Keep electrical cords out of the way. Do not use floor polish or wax that makes floors slippery. If you must use wax, use non-skid floor wax. Do not have throw rugs and other things on the floor that can make you trip. What can I do with my stairs? Do not leave any items on the stairs. Make sure that there are handrails on both sides of the stairs and use them. Fix handrails that are broken or loose. Make sure that handrails are as long as the stairways. Check any carpeting to make sure that it is firmly attached to the stairs. Fix any carpet that is loose or  worn. Avoid having throw rugs at the top or bottom of the stairs. If you do have throw rugs, attach them to the floor with carpet tape. Make sure that you have a light switch at the top of the stairs and the bottom of the stairs. If you do not have them, ask someone to add them for you. What else can I do to help prevent falls? Wear shoes that: Do not have high heels. Have rubber bottoms. Are comfortable and fit you well. Are closed at the toe. Do not wear sandals. If you use a stepladder: Make sure that it is fully opened. Do not climb a closed stepladder. Make sure that both sides of the stepladder are locked into place. Ask someone to hold it for you, if possible. Clearly mark and make sure that you can see: Any grab bars or handrails. First and last steps. Where the edge of each step is. Use tools that help you move around (mobility aids) if they are needed. These include: Canes. Walkers. Scooters. Crutches. Turn on the lights when you go into a dark area. Replace any light bulbs as soon as they burn out. Set up your furniture so you have a clear path. Avoid moving your furniture around. If any of your floors are uneven, fix them. If there are any pets around you, be aware of where they are. Review your medicines with your doctor. Some medicines can make you feel dizzy. This can increase your chance of falling. Ask your doctor what other things that you can do to help prevent falls. This information is not intended to replace advice given to you by your health care provider. Make sure you discuss any questions you have with your health care provider. Document Released: 06/19/2009 Document Revised: 01/29/2016 Document Reviewed: 09/27/2014 Elsevier Interactive Patient Education  2017 [employer].

## 2022-11-09 ENCOUNTER — Telehealth: Payer: Self-pay | Admitting: Cardiovascular Disease

## 2022-11-09 NOTE — Telephone Encounter (Signed)
Returned the call to the patient's wife, per the dpr. The patient was last seen in 04/2020 as a follow up as needed basis.   The wife stated that the patient's blood pressure has been running low with numbers in the 80/50's. She has been advised that per the patient's PCP office note on 2/13:  He was intermittent rarely taking Florinef. Previously rec by cardiology, to keep BP >130 To start, I recommend taking the Florinef 0.'1mg'$  daily now, don't skip a dose. See how the BP goes and it should reduce some of the dizzy when you stand. Be mindful when standing or changing position quickly. Discussed normal mechanism with standing and position change. Try to keep on the Ensure daily and improve intake overall, goal to gain some lbs back to see if we can improve your BP / and dizziness. Referral back to [facility] for a 2nd opinion on the dizziness, / low BP issue. postural dizziness, follow-up if not improving with current treatment plan, suspected orthostatic hypotension / weight loss / volume related, he is increasing frequency of now taking Florinef daily, improving intake for prevent further weight loss. Referral to neuro if patient not improved and interested in 2nd opinion to rule out any other neurological cause.  She has been advised to call PCP to follow up with her concerns but she would also like to have Dr. Donivan Scull input. She has been advised that he would need to come in for an appointment since it has been 2.5 years but a message would be sent to him.

## 2022-11-09 NOTE — Telephone Encounter (Signed)
Pt c/o medication issue:  1. Name of Medication: Fludrocortisone  2. How are you currently taking this medication (dosage and times per day)? 1 pill daily   3. Are you having a reaction (difficulty breathing--STAT)?   4. What is your medication issue? Blood pressure is running really low  80/50 Friday this weekend  87/64 and today it was 98/65

## 2022-11-10 ENCOUNTER — Ambulatory Visit: Payer: Self-pay | Admitting: *Deleted

## 2022-11-10 DIAGNOSIS — I951 Orthostatic hypotension: Secondary | ICD-10-CM

## 2022-11-10 MED ORDER — MIDODRINE HCL 5 MG PO TABS: 5.0000 mg | ORAL_TABLET | Freq: Three times a day (TID) | ORAL | 2 refills | Status: DC

## 2022-11-10 MED ORDER — FLUDROCORTISONE ACETATE 0.1 MG PO TABS: 0.1000 mg | ORAL_TABLET | Freq: Two times a day (BID) | ORAL | 2 refills | Status: DC

## 2022-11-10 NOTE — Telephone Encounter (Signed)
Called patient and spoke with wife.  I advised her that I will raise his dose Florinef 0.'1mg'$  to TWO pills per day, 0.'1mg'$  TWICE per day, new rx 60 pills ordered and refill.  Also I contacted Dr Rockey Situ for this update to make sure this dosing regimen would work. Will collaborate with him.  I asked her to schedule the apt with Dr Rockey Situ in approximately early April 2024 to follow up further as we work on this together.  [name], [facility] 11/10/2022, 3:18 PM

## 2022-11-10 NOTE — Telephone Encounter (Signed)
Message from [place] sent at 11/10/2022 12:56 PM EST  Summary: bp concerns / requesting med refill   Pt wife calling stated pt's BP has been very low today is the first day that it has been higher at 110/80. Pt wife stated she is very concerned around the first of the month a nurse at the office checked his BP and it was 80/50. Stated patient recently saw Dr. Raliegh Ip, and Dr.K advised him to call if he needed anything. Wife mentioned this is very scary to her.  Pt wife stated they have been unable to reach Dr. Rockey Situ, and pt needs a refill on his medication, fludrocortisone (FLORINEF) 0.1 MG tablet .   Seeking clinical advice.          Call History   Type Contact Phone/Fax User  11/10/2022 12:53 PM EST Phone (Incoming) Fossett,Joan (Emergency Contact) [phone] [place], [place]   Reason for Disposition . [1] Caller requesting NON-URGENT health information AND [2] PCP's office is the best resource  Answer Assessment - Initial Assessment Questions 1. REASON FOR CALL or QUESTION: "What is your reason for calling today?" or "How can I best help you?" or "What question do you have that I can help answer?"     See notes section.     Dr. Rockey Situ is requesting that Dr. Parks Ranger dose the Florinef 0.1 mg to regulate pt's low BP.    Pt. Has not been seen by Dr. Rockey Situ in a year and can't get an appt. Until April 2024.     Wife had just gotten off the phone with Dr. Donivan Scull office when I returned her call.  I sending this message to [place] for Dr. Parks Ranger.     There is also a note from Dr. Donivan Scull office in the chart.  Protocols used: Information Only Call - No Triage-A-AH

## 2022-11-10 NOTE — Telephone Encounter (Signed)
Update - discussed with Dr Rockey Situ  He recommends switch from Florinef to Midodrine.  I called patient's wife and we ordered Midodrine '5mg'$  THREE TIMES A DAY dosing every day for elevating BP.  He may need double dose up to Midodrine '10mg'$  THREE TIMES A DAY in future.  He will taper off Florinef 0.'1mg'$  now, take one pill every other day for 2 weeks, then every 3rd day for 2 weeks and then stop.  He will f/u with Dr Rockey Situ in 1 month  [name], [facility] 11/10/2022, 4:26 PM

## 2022-11-10 NOTE — Telephone Encounter (Signed)
Wife just got off phone with Dr. Rockey Situ, cardiologist.    Dr. Rockey Situ told them to check with Dr. Parks Ranger about dosing the Florinef 0.1 mg for his BP.    Pt. Has not seen Dr. Rockey Situ in a year and pt. can't get in until April.    Dr. Rockey Situ said he needed to see the pt before making dosage adjustments so is asking Dr. Parks Ranger if he will dose the Florinef 0.1 mg to regulate pt's BP. Since Dr. Parks Ranger has seen pt. Recently.  His systolic BP needs to be greater than 130 and it's been running lower than that.   Today his BP is low.    Chief Complaint: Wife concerned about low BP.   Dr. Rockey Situ has requested that Dr. Parks Ranger dose the Florinef 0.1 mg to regulate pt's BP. Symptoms: Pt. Is tired.  Running a low BP today. Frequency: N/A Pertinent Negatives: Patient denies N/A Disposition: '[]'$ ED /'[]'$ Urgent Care (no appt availability in office) / '[]'$ Appointment(In office/virtual)/ '[]'$  [facility]/ '[]'$ Home Care/ '[]'$ Refused Recommended Disposition /'[]'$ [facility]/ '[x]'$  Follow-up with PCP Additional Notes: Message sent to Dr. Parks Ranger.   Also see note from Dr. Donivan Scull office.

## 2022-11-10 NOTE — Telephone Encounter (Signed)
Returned the call to patient and his wife. They have been made aware of the following:  Per Dr. Rockey Situ: Situation sounds very similar to what it was in 2021 Agree with restarting fludrocortisone every other day or daily Would set up visit to be seen if blood pressure stays low Would make sure hydrated, not losing weight/maintaining adequate diet  Thx TGollan   The wife stated that she is waiting to hear back from the PCP about a possible dose change. Soonest available appointment here would be in April. She will keep [place] updated.

## 2022-11-10 NOTE — Telephone Encounter (Signed)
Pt called back to make an appt, informed her Dr. Rockey Situ has 5/14. APP has April. She asked to speak to Lattie Haw, RN again.

## 2022-11-10 NOTE — Addendum Note (Signed)
Addended by: Olin Hauser on: 11/10/2022 03:18 PM   Modules accepted: Orders

## 2022-11-10 NOTE — Addendum Note (Signed)
Addended by: Olin Hauser on: 11/10/2022 04:26 PM   Modules accepted: Orders

## 2022-11-11 ENCOUNTER — Telehealth: Payer: Self-pay | Admitting: Cardiovascular Disease

## 2022-11-11 NOTE — Telephone Encounter (Signed)
Duplicate

## 2022-11-11 NOTE — Telephone Encounter (Signed)
Appointment made for 4/9 with Dr. Rockey Situ.

## 2022-11-11 NOTE — Telephone Encounter (Signed)
Pt's wife is requesting to speak to lisa about the pt's upcomming appt.

## 2022-12-07 ENCOUNTER — Ambulatory Visit: Payer: Medicare HMO | Admitting: Neurology

## 2022-12-07 ENCOUNTER — Encounter: Payer: Self-pay | Admitting: Neurology

## 2022-12-07 VITALS — BP 96/66 | HR 86 | Ht 72.0 in | Wt 150.0 lb

## 2022-12-07 DIAGNOSIS — Z8673 Personal history of transient ischemic attack (TIA), and cerebral infarction without residual deficits: Secondary | ICD-10-CM

## 2022-12-07 DIAGNOSIS — R42 Dizziness and giddiness: Secondary | ICD-10-CM | POA: Diagnosis not present

## 2022-12-07 DIAGNOSIS — I6521 Occlusion and stenosis of right carotid artery: Secondary | ICD-10-CM

## 2022-12-07 DIAGNOSIS — I6990 Unspecified sequelae of unspecified cerebrovascular disease: Secondary | ICD-10-CM

## 2022-12-07 NOTE — Progress Notes (Unsigned)
[facility] [address] [place]. [place] [address] [phone]       STROKE FOLLOW UP NOTE  [name] Date of Birth:  June 18, 1956 Medical Record Number:  [id number]   Reason for Referral:  stroke follow up    SUBJECTIVE:   CHIEF COMPLAINT:  Chief Complaint  Patient presents with   New Patient (Initial Visit)    Rm 20   Ref MD : Ethan Fitzgerald Reason for referral : Stroke  HPI:  Update 12/07/2022 : Patient is referred back for evaluation by primary physician for subjective complaints of dizziness and lightheadedness to rule out cerebrovascular etiology.  Patient states the last couple of months she has had the symptoms.  He was previously on fludrocortisone but recently medications were changed to midodrine 5 mg 3 times daily he seems to be working somewhat better.  Blood pressure still runs low today it is 96/66.  He states that this feeling of dizziness and lightheadedness comes on suddenly.  This does not last long.  He is usually able to sit down and wait to this feeling passes.  He denies any vertigo, nausea, loss of vision, double vision or extremity weakness or numbness.  Denies any headache.  Complains is limited.  He still has some mild subjective weakness and numbness in his left hand and drags his left leg from his previous stroke 2021 no recurrent stroke or TIA symptoms.  He remains on aspirin which is tolerating well without bruising or bleeding.  States he is tolerating Crestor well without muscle aches and pains.  Lab work on 12/23/2021 showed LDL cholesterol to be 44 mg percent and hemoglobin A1c to be 5.7.  He has not had any recent brain imaging or neurovascular studies done Update 09/17/2021 JM: Returns for 48-month stroke follow-up unaccompanied.  Overall stable without new stroke/TIA symptoms. Residual left hand deficits stable and occasional dragging left leg with fatigue (present since stroke).  Compliant on aspirin and Crestor without  side effects.  Blood pressure today 129/90. Routinely monitors at home - if sleeps well, am SBP can be around 110-120 but if did not sleep well will be much higher. Continued c/o lightheadedness/dizziness upon standing which was discussed at prior visit. Remains on Florinef 0.1mg  PRN for orthostatic hypotension managed by PCP/cardiology. Does c/o worsening balance over the past 6 months - worse when going up and down stairs and looking up for prolonged period of time then quickly looking down. Denies vision changes, headache or any other neuro deficit associated. Was seen by PCP 11/4 for neck pain and then at urgent care on 11/20 for the same. Rx'd baclofen and did one visit of acupuncture - has improved some. No longer on baclofen. Has had to adjust sleeping position. Fatigue improved some but will still have difficulty sleeping at night at times and decreased endurance and greater fatigue towards end of day.  Remains on vitamin B12 and vitamin D managed by PCP.  He has previously declined pursuing sleep evaluation for possible sleep apnea but now willing to proceed. No further concerns at this time.     History provided for reference purposes only Update 03/12/2021 JM: Ethan Fitzgerald returns for 89-month stroke follow-up.  Stable from stroke standpoint without new stroke/TIA symptoms.  Reports residual left finger tip numbness.  He continues to work and will have some difficulty with fine motor skills of left hand as he is left-hand dominant but at times able to compensate with his right hand.  Compliant  aspirin and Crestor associated side effects.  Blood pressure today 135/93.  Daytime fatigue continues especially towards the end of the day after work.  Completed lab work by PCP which did show vitamin B12 and D deficiency and currently on supplement.  He plans on repeating lab work at the end of next month.  He has not yet had follow-up with Ethan Fitzgerald.  He also complains of 1 month onset of lightheadedness and  dizziness - usually lasts approx 30 seconds.  Specific triggers include looking up, standing up too quickly from a seated position or when bending over.  He feels like these are the similar symptoms he was previously having with low blood pressure (previously discussed in Septembers appt).  He does endorse drinking 6-8 bottles of water per day and does eat throughout the day.  These typically occur when he is at work so he has not been able to check his blood pressure.  He does have Florinef which he will take as needed for low blood pressure which he will have to use on occasion but typically blood pressure within goal.  Orthostatics: Laying: 132/89, HR 78 Sitting: 129/86, HR 73 Standing: 105/78, HR 83   Update 09/09/2020 JM: Ethan Fitzgerald returns for stroke follow-up.  Reports residual left hand fingertip numbness and left facial weakness.  Denies residual speech impairment or cognitive deficit.  He has since returned back to driving and working without difficulty although does experience excessive fatigue by the end of the day - he will typically fall asleep in chair watching TV typically worse after increased exertion. He does not always get adequate sleep at night and at times will have to take a " generic OTC sleeping pill".  Denies new or worsening stroke/TIA symptoms.  Remains on aspirin and Crestor for secondary stroke prevention without side effects.  Blood pressure today 142/96. Monitors at home and typically 120-125/80s.  Remains on Florinef 0.1 mg daily as needed typically is SBP less than 120.  He does have multiple questions regarding CTA head/neck that was completed by Ethan Fitzgerald 06/2020 which showed continued bilateral ICA occlusions.  No further concerns at this time.  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 He does report dizziness and lightheadedness upon standing. Monitors blood pressures at home which has been ranging 110-120/70s over the past week. No further concerns  Stroke admission 03/27/2020 [name] is a 67 y.o. male with history of COPD who presented on 03/27/2020 with L sided weakness.  Stroke work-up revealed right MCA and multiple right and left anterior circulation infarcts s/p tPA and IR with partial  revascularization of R MCA and s/p angioplasty for acute R ICA occlusion with reocclusion with resultant small SAH, most likely secondary to large vessel disease with questionable dissection due to coughing episode vs hypothyroid state from parotid neoplasm.  Recommended aspirin alone until [facility] resolves and then consider DAPT for 3 months then aspirin alone.  Incidental finding of parotid mass on MRI suspicious for primary neoplasm and recommended ENT for further evaluation.  Carotid stenosis with chronic left ICA occlusion and acute right ICA occlusion s/p R ICA angioplasty with reocclusion and questionable dissection from cough  with long-term BP goal 130-150.  LDL 82 and recommended continuation of Crestor 20 mg daily.  Other stroke risk factors include advanced age, former tobacco use, former smokeless tobacco use and EtOH use.  No prior stroke history.  Residual deficits of mild dysarthria, left upper and lower facial weakness, dysphagia, and mild left hemiparesis and discharged to CIR for ongoing therapy needs.  Stroke:   R MCA and multiple R and L anterior circulation infarcts s/p tPA and IR w/ partial revascularization with resultant small SAH, most likely secondary to large vessel disease with ? dissection due to coughing episodes, vs. Hypercoagulable state from parotid neoplasm  CT head dense R M1. Subtle asymmetric hypodensity R caudate and lentiform nucleus. ASPECTS 8   CTA head & neck R ICA LVO at bifurcation origin w/ cavernous reconstitution and reocclusion proximal R M1. Good MCA collaterals. L ICA occlusion at bifurcation origin. Short severe L ICA cavernous stenosis.   Cerebral angio partial revascularization R MCA dominant division w/ TICI2a/TICI2b. S/p angioplasty acute R ICA occlusion w/ reocclusion    Post IR CT mild to mod RT peri-insular and post frontal convexity contrast stain vs SAH  MRI  R MCA frontal lobe infarct w/ possible mild petechial hemorrhage. B ICA siphon high-grade stenosis vs occlusion. R MCA SAH.  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 No source of embolus  LDL 82  HgbA1c 5.6 VTE prophylaxis - SCDs  No antithrombotic prior to admission, now on ASA alone due to small SAH. Plan to repeat CT on 7/30, if SAH resolves or nearly resoved, will consider DAPT for 3 months.  Therapy recommendations:  CIR - consult placed Disposition:  pending - await for CIR input, if not a candidate will d/c home     ROS:   14 system review of systems performed and negative with exception of see HPI  PMH:  Past  Medical History:  Diagnosis Date   COPD (chronic obstructive pulmonary disease)    mild   COVID-19 09/2019   Erectile dysfunction    Headache    daily, stress headaches   Knee pain, left    Seasonal allergies    Stroke    Tobacco dependence     PSH:  Past Surgical History:  Procedure Laterality Date   BACK SURGERY     metal plate in back   COLONOSCOPY     COLONOSCOPY WITH PROPOFOL N/A 04/26/2019   Procedure: COLONOSCOPY WITH PROPOFOL;  Surgeon: Jonathon Bellows, MD;  Location: [facility] ENDOSCOPY;  Service: Gastroenterology;  Laterality: N/A;   HERNIA REPAIR Right    IR ANGIO INTRA EXTRACRAN SEL COM CAROTID INNOMINATE UNI L MOD SED  03/27/2020   IR ANGIO VERTEBRAL SEL SUBCLAVIAN INNOMINATE UNI L MOD SED  03/27/2020   IR ANGIO VERTEBRAL SEL VERTEBRAL UNI R MOD SED  03/27/2020   IR  CT HEAD LTD  03/27/2020   IR PERCUTANEOUS ART THROMBECTOMY/INFUSION INTRACRANIAL INC DIAG ANGIO  03/27/2020   KNEE ARTHROSCOPY Left 03/28/2015   Procedure: Arthroscopic partial medial meniscectomy plus chondral debridement;  Surgeon: Leanor Kail, MD;  Location: [facility];  Service: Orthopedics;  Laterality: Left;   RADIOLOGY WITH ANESTHESIA N/A 03/27/2020   Procedure: IR WITH ANESTHESIA;  Surgeon: Luanne Bras, MD;  Location: [place];  Service: Radiology;  Laterality: N/A;    Social History:  Social History   Socioeconomic History   Marital status: Married    Spouse name: [name]   Number of children: 0   Years of education: Not on file   Highest education level: Some college, no degree  Occupational History   Not on file  Tobacco Use   Smoking status: Former    Packs/day: 1.50    Years: 49.00    Additional pack years: 0.00    Total pack years: 73.50    Types: Cigarettes    Start date: 54    Quit date: 09/14/2017    Years since quitting: 5.2   Smokeless tobacco: Former  Scientific laboratory technician Use: Never used  Substance and Sexual Activity   Alcohol use: Yes    Comment: 6 pack on weekend    Drug use: No   Sexual activity: Not on file  Other Topics Concern   Not on file  Social History Narrative   Lives at home w wife   L handed   Caffeine: 1 C of coffee AM   Social Determinants of Health   Financial Resource Strain: Low Risk  (11/05/2022)   Overall Financial Resource Strain (CARDIA)    Difficulty of Paying Living Expenses: Not hard at all  Food Insecurity: No Food Insecurity (11/05/2022)   Hunger Vital Sign    Worried About Running Out of Food in the Last Year: Never true    [place] in the Last Year: Never true  Transportation Needs: No Transportation Needs (11/05/2022)   PRAPARE - Hydrologist (Medical): No    Lack of Transportation (Non-Medical): No  Physical Activity: Insufficiently Active (11/05/2022)   Exercise Vital Sign    Days of Exercise per Week: 3 days    Minutes of Exercise per Session: 20 min  Stress: No Stress Concern Present (11/05/2022)   [place]    Feeling of Stress : Not at all  Social Connections: Socially Isolated (11/05/2022)   Social Connection and Isolation Panel [NHANES]    Frequency of Communication with Friends and Family: Once a week    Frequency of Social Gatherings with Friends and Family: Never    Attends Religious Services: Never    Marine scientist or Organizations: No    Attends Archivist Meetings: Never    Marital Status: Married  Human resources officer Violence: Not At Risk (11/05/2022)   Humiliation, Afraid, Rape, and Kick questionnaire    Fear of Current or Ex-Partner: No    Emotionally Abused: No    Physically Abused: No    Sexually Abused: No    Family History:  Family History  Problem Relation Age of Onset   Diabetes Mother    Heart attack Mother 35   Cancer Father        liver    COPD Brother    HIV/AIDS Brother    Prostate cancer Neg Hx    Colon cancer Neg Hx  Medications:   Current Outpatient  Medications on File Prior to Visit  Medication Sig Dispense Refill   albuterol (VENTOLIN HFA) 108 (90 Base) MCG/ACT inhaler INHALE 1 TO 2 PUFFS EVERY 4 HOURS AS NEEDED FOR WHEEZING OR SHORTNESS OF BREATH 1 each 2   aspirin EC 325 MG EC tablet Take 1 tablet (325 mg total) by mouth daily. 90 tablet 0   cholecalciferol (VITAMIN D3) 25 MCG (1000 UNIT) tablet Take 1,000 Units by mouth daily.     fludrocortisone (FLORINEF) 0.1 MG tablet Take 0.1 mg by mouth daily.     methocarbamol (ROBAXIN) 500 MG tablet Take 1 tablet (500 mg total) by mouth 4 (four) times daily. 20 tablet 0   midodrine (PROAMATINE) 5 MG tablet Take 1 tablet (5 mg total) by mouth 3 (three) times daily with meals. 90 tablet 2   naproxen (NAPROSYN) 500 MG tablet Take 1 tablet (500 mg total) by mouth 2 (two) times daily with a meal. 10 tablet 0   rosuvastatin (CRESTOR) 40 MG tablet TAKE 1 TABLET AT BEDTIME . APPOINTMENT IS NEEDED 90 tablet 0   sildenafil (REVATIO) 20 MG tablet TAKE 1 TO 5 TABLETS BY MOUTH ABOUT 30 MINUTES PRIOR TO SEX.START WITH 1 THEN INCREASE 30 tablet 5   STIOLTO RESPIMAT 2.5-2.5 MCG/ACT AERS INHALE 2 PUFFS INTO THE LUNGS DAILY. 12 g 3   traMADol (ULTRAM) 50 MG tablet Take 50 mg by mouth every 8 (eight) hours.     vitamin B-12 (CYANOCOBALAMIN) 1000 MCG tablet Take 1 tablet (1,000 mcg total) by mouth daily.     vitamin C (ASCORBIC ACID) 500 MG tablet Take 500 mg by mouth daily. Am     No current facility-administered medications on file prior to visit.    Allergies:  No Known Allergies    OBJECTIVE:  Physical Exam  Vitals:   12/07/22 0859  BP: 96/66  Pulse: 86  Weight: 150 lb (68 kg)  Height: 6' (1.829 m)   Body mass index is 20.34 kg/m. No results found.  General: well developed, well nourished, pleasant middle-age Caucasian male, seated, in no evident distress Head: head normocephalic and atraumatic.   Neck: supple with no carotid or supraclavicular bruits Cardiovascular: regular rate and rhythm,  no murmurs Musculoskeletal: no deformity Skin:  no rash/petichiae Vascular:  Normal pulses all extremities   Neurologic Exam Mental Status: Awake and fully alert. Fluent speech and language. Oriented to place and time. Recent memory subjectively impaired and remote memory intact. Attention span, concentration and fund of knowledge appropriate during visit. Unable to complete further cog eval due to time constraints.  Mood and affect appropriate.  Cranial Nerves: Pupils equal, briskly reactive to light. Extraocular movements full without nystagmus.  Visual fields full to confrontation. Hearing intact. Facial sensation intact.  Mild left lower facial weakness. Motor: Normal bulk and tone. Normal strength in all tested extremity muscles.  Diminished fine finger movements on the left.  Orbits right over left upper extremity. Sensory.: intact to touch , pinprick , position and vibratory sensation. Romberg negative but subjectively felt lightheaded after closing eyes. Coordination: Rapid alternating movements normal in all extremities except slight decrease left hand dexterity. Finger-to-nose and heel-to-shin performed accurately bilaterally.  Mildly orbits right arm over left arm Gait and Station: Arises from chair without difficulty. Stance is normal. Gait demonstrates normal stride length and balance without use of assistive device. Moderate difficulty with tandem walk and heel toe.  Reflexes: 1+ and symmetric. Toes downgoing.  ASSESSMENT: Ethan Fitzgerald is a 67 y.o. year old male with R MCA and multiple b/l anterior circulation strokes on 03/27/2020 s/p tPA and IR with partial revascularization and s/p angioplasty for acute right ICA occlusion with subsequent reocclusion, infarct most likely secondary to large vessel disease with questionable dissection vs hypercoagulable state from parotid neoplasm. Vascular risk factors include parotid mass, carotid stenosis, HLD, former tobacco use and EtOH  use.  New complaints of subjective dizziness and lightheadedness for the last month or so possibly related to change in his blood pressure medications versus orthostasis.  Doubt cerebrovascular etiology.     PLAN:  I had a long d/w patient and his wife about his remote stroke, right carotid occlusion, dizziness risk for recurrent stroke/TIAs, personally independently reviewed imaging studies and stroke evaluation results and answered questions.Continue aspirin 325 mg daily  for secondary stroke prevention and maintain strict control of hypertension with blood pressure goal below 130/90, diabetes with hemoglobin A1c goal below 6.5% and lipids with LDL cholesterol goal below 70 mg/dL. I also advised the patient to eat a healthy diet with plenty of whole grains, cereals, fruits and vegetables, exercise regularly and maintain ideal body weight.  I advised him to do orthostatic tolerance exercises but he gets up and increase his salt intake.  Continue midodrine 5 mg 3 times daily for his orthostatic hypotension keep systolic around [id number] range.  Check screening follow-up carotid ultrasound and transcranial Doppler studies.  Patient is neurologically cleared to undergo back surgery may hold aspirin for 3 days prior to procedure room and resume after procedure when safe  with a small but acceptable periprocedural risk for TIA/stroke. Followup in the future with Janett Billow, nurse practitioner in 6 months or call earlier if necessary.   F/u after completion of sleep evaluation      I spent 45 minutes of face-to-face and non-face-to-face time with patient.  This included previsit chart review, lab review, study review, order entry, electronic health record documentation, patient education regarding prior stroke and etiology as well as secondary stroke prevention measures importance of aggressive stroke risk factor management, residual deficits, continued daytime fatigue and concern for stroke, dizziness and  lightheadedness in setting of orthostatic hypotension, worsening gait/balance and answered all other questions to patients satisfaction Antony Contras, [facility] [address] [place], [place] [address]  Phone [phone] Fax [phone] Note: This document was prepared with digital dictation and possible smart phrase technology. Any transcriptional errors that result from this process are unintentional.  CC:  Parks Ranger, Devonne Doughty, DO

## 2022-12-07 NOTE — Patient Instructions (Signed)
I had a long d/w patient and his wife about his remote stroke, right carotid occlusion, dizziness risk for recurrent stroke/TIAs, personally independently reviewed imaging studies and stroke evaluation results and answered questions.Continue aspirin 325 mg daily  for secondary stroke prevention and maintain strict control of hypertension with blood pressure goal below 130/90, diabetes with hemoglobin A1c goal below 6.5% and lipids with LDL cholesterol goal below 70 mg/dL. I also advised the patient to eat a healthy diet with plenty of whole grains, cereals, fruits and vegetables, exercise regularly and maintain ideal body weight.  I advised him to do orthostatic tolerance exercises but he gets up and increase his salt intake.  Continue midodrine 5 mg 3 times daily for his orthostatic hypotension keep systolic around [id number] range.  Check screening follow-up carotid ultrasound and transcranial Doppler studies.  Patient is neurologically cleared to undergo back surgery may hold aspirin for 3 days prior to procedure room and resume after procedure when safe  with a small but acceptable periprocedural risk for TIA/stroke. Followup in the future with Janett Billow, nurse practitioner in 6 months or call earlier if necessary.

## 2022-12-13 NOTE — Progress Notes (Unsigned)
Cardiology Office Note  Date:  12/14/2022   ID:  Ethan Fitzgerald, DOB 01/28/1956, MRN [id number]  PCP:  Smitty CordsKaramalegos, Alexander J, DO   Chief Complaint  Patient presents with   Follow-up    C/O elevated bp.  Currently out of work due to back injury.    HPI:  Ethan Fitzgerald is a 67 year old gentleman with past medical history of Smoker  PAD, bilateral ICA occlusion History of stroke 03/2020 Back pain Who presents for follow-up of his hypotension/weight loss, PAD  Last seen by myself in clinic August 2021  Hurt back at work, lifting sand bags  on last clinic visit started on Florinef, has not taking this on a consistent manner Weight down 6 pounds from prior clinic visit Continued to have orthostasis symptoms  changed to midodrine 5 mg 3 times daily for orthostasis Reports he is taking first dose at 8 AM  Reports that his BP continues to be running low at home, low in the Am, 80 systolic Doing PT, stretching for his back  Chronic fatigue Falling asleep at 4pm Wakes at 3 Am, bathroom, stays awake  Taking crestor QOD Labs pending with PMD  EKG personally reviewed by myself on todays visit Normal sinus rhythm rate 75 bpm no significant ST-T wave changes  Other past medical history reviewed Hospital Notes reviewed March 27, 2020 woke with left-sided weakness, EMS called, code stroke activated [facility]  [facility] scan showing 1. Dense right M1 segment, concerning for thrombus/LVO. Subtle asymmetric hypodensity involving the right caudate and lentiform nuclei concerning for early ischemic changes. No intracranial Hemorrhage.  CTA Head W/WO Contrast:    R MCA and multiple R and L anterior circulation infarcts s/p tPA and IR w/ partial revascularization with resultant small SAH.  Imaging confirming occlusion bilateral ICA  He received TPA and underwent cerebral angio with partial revascularization of right-MCA dominant division with unsuccessful attempts at revascularization of  proximal right ICA.    Ethan Fitzgerald was admitted to inpatient Rehabilitation on 04/01/2020 and discharged home on 04/09/2020. No mention of low blood pressure during his rehab  Residual weakness in his left hand   PMH:   has a past medical history of COPD (chronic obstructive pulmonary disease), COVID-19 (09/2019), Erectile dysfunction, Headache, Knee pain, left, Seasonal allergies, Stroke, and Tobacco dependence.  PSH:    Past Surgical History:  Procedure Laterality Date   BACK SURGERY     metal plate in back   COLONOSCOPY     COLONOSCOPY WITH PROPOFOL N/A 04/26/2019   Procedure: COLONOSCOPY WITH PROPOFOL;  Surgeon: Wyline MoodAnna, Kiran, MD;  [facility] END[facility];  Service: Gastroenterology;  Laterality: N/A;   HERNIA REPAIR Right    IR ANGIO INTRA EXTRACRAN SEL COM CAROTID INNOMINATE UNI L MOD SED  03/27/2020   IR ANGIO VERTEBRAL SEL SUBCLAVIAN INNOMINATE UNI L MOD SED  03/27/2020   IR ANGIO VERTEBRAL SEL VERTEBRAL UNI R MOD SED  03/27/2020   IR CT HEAD LTD  03/27/2020   IR PERCUTANEOUS ART THROMBECTOMY/INFUSION INTRACRANIAL INC DIAG ANGIO  03/27/2020   KNEE ARTHROSCOPY Left 03/28/2015   Procedure: Arthroscopic partial medial meniscectomy plus chondral debridement;  Surgeon: Erin SonsHarold Kernodle, MD;  [facility];  Service: Orthopedics;  Laterality: Left;   RADIOLOGY WITH ANESTHESIA N/A 03/27/2020   Procedure: IR WITH ANESTHESIA;  Surgeon: Julieanne Cottoneveshwar, Sanjeev, MD;  Location: [facility] OR;  Service: Radiology;  Laterality: N/A;    Current Outpatient Medications  Medication Sig Dispense Refill   albuterol (VENTOLIN HFA) 108 (  90 Base) MCG/ACT inhaler INHALE 1 TO 2 PUFFS EVERY 4 HOURS AS NEEDED FOR WHEEZING OR SHORTNESS OF BREATH 1 each 2   aspirin EC 325 MG EC tablet Take 1 tablet (325 mg total) by mouth daily. 90 tablet 0   cholecalciferol (VITAMIN D3) 25 MCG (1000 UNIT) tablet Take 1,000 Units by mouth daily.     fludrocortisone (FLORINEF) 0.1 MG tablet Take 0.1 mg by mouth daily.      methocarbamol (ROBAXIN) 500 MG tablet Take 1 tablet (500 mg total) by mouth 4 (four) times daily. 20 tablet 0   midodrine (PROAMATINE) 5 MG tablet Take 1 tablet (5 mg total) by mouth 3 (three) times daily with meals. 90 tablet 2   naproxen (NAPROSYN) 500 MG tablet Take 1 tablet (500 mg total) by mouth 2 (two) times daily with a meal. 10 tablet 0   rosuvastatin (CRESTOR) 40 MG tablet TAKE 1 TABLET AT BEDTIME . APPOINTMENT IS NEEDED 90 tablet 0   sildenafil (REVATIO) 20 MG tablet TAKE 1 TO 5 TABLETS BY MOUTH ABOUT 30 MINUTES PRIOR TO SEX.START WITH 1 THEN INCREASE 30 tablet 5   STIOLTO RESPIMAT 2.5-2.5 MCG/ACT AERS INHALE 2 PUFFS INTO THE LUNGS DAILY. 12 g 3   traMADol (ULTRAM) 50 MG tablet Take 50 mg by mouth every 8 (eight) hours.     vitamin B-12 (CYANOCOBALAMIN) 1000 MCG tablet Take 1 tablet (1,000 mcg total) by mouth daily.     vitamin C (ASCORBIC ACID) 500 MG tablet Take 500 mg by mouth daily. Am     No current facility-administered medications for this visit.    Allergies:   Patient has no known allergies.   Social History:  The patient  reports that he quit smoking about 5 years ago. His smoking use included cigarettes. He started smoking about 54 years ago. He has a 73.50 pack-year smoking history. He has quit using smokeless tobacco. He reports current alcohol use. He reports that he does not use drugs.   Family History:   family history includes COPD in his brother; Cancer in his father; Diabetes in his mother; HIV/AIDS in his brother; Heart attack (age of onset: 69) in his mother.    Review of Systems: Review of Systems  Constitutional: Negative.   HENT: Negative.    Respiratory: Negative.    Cardiovascular: Negative.   Gastrointestinal: Negative.   Musculoskeletal: Negative.   Neurological: Negative.   Psychiatric/Behavioral: Negative.    All other systems reviewed and are negative.   PHYSICAL EXAM: VS:  BP 120/70 (BP Location: Left Arm, Patient Position: Sitting)    Pulse 75   Ht 6' (1.829 m)   Wt 155 lb (70.3 kg)   SpO2 98%   BMI 21.02 kg/m  , BMI Body mass index is 21.02 kg/m. Constitutional:  oriented to person, place, and time. No distress.  Thin  HENT:  Head: Grossly normal Eyes:  no discharge. No scleral icterus.  Neck: No JVD, no carotid bruits  Cardiovascular: Regular rate and rhythm, no murmurs appreciated Pulmonary/Chest: Clear to auscultation bilaterally, no wheezes or rails Abdominal: Soft.  no distension.  no tenderness.  Musculoskeletal: Normal range of motion Neurological:  normal muscle tone. Coordination normal. No atrophy Skin: Skin warm and dry Psychiatric: normal affect, pleasant  Recent Labs: 12/23/2021: ALT 18; BUN 16; Creat 1.08; Hemoglobin 16.0; Platelets 174; Potassium 3.9; Sodium 140; TSH 1.90    Lipid Panel Lab Results  Component Value Date   CHOL 115 12/23/2021   HDL 45  12/23/2021   LDLCALC 44 12/23/2021   TRIG 185 (H) 12/23/2021    Wt Readings from Last 3 Encounters:  12/14/22 155 lb (70.3 kg)  12/07/22 150 lb (68 kg)  11/05/22 149 lb 6.4 oz (67.8 kg)     ASSESSMENT AND PLAN:  Problem List Items Addressed This Visit       Cardiology Problems   PAD (peripheral artery disease) - Primary   Carotid stenosis     Other   Centrilobular emphysema  History of stroke/PAD  bilateral carotid disease Hx of stroke, PAD,  Followed by neurology and neurosurgery  Orthostasis Exacerbated by stroke, dramatic weight loss over the past several years Stressed importance of liberal salt, high fluid intake, gaining weight Recommend he continue midodrine 5 mg when he first wakes up (currently taking it at 8 AM).  Take midodrine 3 times daily  may need to increase dose up to 10 mg if he has symptoms Okay to mix with Florinef 0.1 daily.  Notes indicating he has been less compliant with the Florinef  PAD: Right carotid occlusion On Crestor but only taking this every other day, Smoking cessation recommended Goal  LDL less than 70  Chronic back pain Reports he is out on [employer] injured himself lifting sandbags Doing PT, followed by neurosurgery Has had prior back surgery with a plate in place    Total encounter time more than 30 minutes  Greater than 50% was spent in counseling and coordination of care with the patient    Signed, Dossie Arbour, M.D., Ph.D. [facility] [place], [place] [phone]

## 2022-12-14 ENCOUNTER — Ambulatory Visit: Payer: Medicare HMO | Attending: Cardiovascular Disease | Admitting: Cardiovascular Disease

## 2022-12-14 ENCOUNTER — Encounter: Payer: Self-pay | Admitting: Cardiovascular Disease

## 2022-12-14 VITALS — BP 120/70 | HR 75 | Ht 72.0 in | Wt 155.0 lb

## 2022-12-14 DIAGNOSIS — J432 Centrilobular emphysema: Secondary | ICD-10-CM | POA: Diagnosis not present

## 2022-12-14 DIAGNOSIS — I739 Peripheral vascular disease, unspecified: Secondary | ICD-10-CM

## 2022-12-14 DIAGNOSIS — I6523 Occlusion and stenosis of bilateral carotid arteries: Secondary | ICD-10-CM | POA: Diagnosis not present

## 2022-12-14 DIAGNOSIS — I951 Orthostatic hypotension: Secondary | ICD-10-CM

## 2022-12-14 MED ORDER — MIDODRINE HCL 5 MG PO TABS: ORAL_TABLET | ORAL | 2 refills | Status: DC

## 2022-12-14 NOTE — Patient Instructions (Addendum)
Medication Instructions:  Midodrine first thing in the morning 5mg  Wait 1-2 hours and then extra 5 mg pill if needed for continued dizziness  Stay on florinef 0.1 mg daily  If you need a refill on your cardiac medications before your next appointment, please call your pharmacy.   Lab work: No new labs needed  Testing/Procedures: No new testing needed  Follow-Up: At [facility], you and your health needs are our priority.  As part of our continuing mission to provide you with exceptional heart care, we have created designated Provider Care Teams.  These Care Teams include your primary Cardiologist (physician) and Advanced Practice Providers (APPs -  Physician Assistants and Nurse Practitioners) who all work together to provide you with the care you need, when you need it.  You will need a follow up appointment in 12 months  Providers on your designated Care Team:   Ethan Ducking, NP Ethan Listen, PA-C Ethan Fitzgerald, [place]  COVID-19 Vaccine Information can be found at: [email or web address] For questions related to vaccine distribution or appointments, please email [email or web address] or call [phone].

## 2022-12-22 ENCOUNTER — Other Ambulatory Visit: Payer: Medicare HMO

## 2022-12-22 DIAGNOSIS — R351 Nocturia: Secondary | ICD-10-CM | POA: Diagnosis not present

## 2022-12-22 DIAGNOSIS — R7309 Other abnormal glucose: Secondary | ICD-10-CM | POA: Diagnosis not present

## 2022-12-22 DIAGNOSIS — I6930 Unspecified sequelae of cerebral infarction: Secondary | ICD-10-CM

## 2022-12-22 DIAGNOSIS — E559 Vitamin D deficiency, unspecified: Secondary | ICD-10-CM

## 2022-12-22 DIAGNOSIS — Z0000 Encounter for general adult medical examination without abnormal findings: Secondary | ICD-10-CM | POA: Diagnosis not present

## 2022-12-22 DIAGNOSIS — E782 Mixed hyperlipidemia: Secondary | ICD-10-CM | POA: Diagnosis not present

## 2022-12-23 LAB — CBC WITH DIFFERENTIAL/PLATELET
Absolute Monocytes: 822 cells/uL (ref 200–950)
Basophils Absolute: 69 cells/uL (ref 0–200)
Basophils Relative: 0.5 %
Eosinophils Absolute: 301 cells/uL (ref 15–500)
Eosinophils Relative: 2.2 %
HCT: 38.7 % (ref 38.5–50.0)
Hemoglobin: 12.3 g/dL — ABNORMAL LOW (ref 13.2–17.1)
Lymphs Abs: 2302 cells/uL (ref 850–3900)
MCH: 29.5 pg (ref 27.0–33.0)
MCHC: 31.8 g/dL — ABNORMAL LOW (ref 32.0–36.0)
MCV: 92.8 fL (ref 80.0–100.0)
MPV: 11.6 fL (ref 7.5–12.5)
Monocytes Relative: 6 %
Neutro Abs: 10207 cells/uL — ABNORMAL HIGH (ref 1500–7800)
Neutrophils Relative %: 74.5 %
Platelets: 531 10*3/uL — ABNORMAL HIGH (ref 140–400)
RBC: 4.17 10*6/uL — ABNORMAL LOW (ref 4.20–5.80)
RDW: 13.1 % (ref 11.0–15.0)
Total Lymphocyte: 16.8 %
WBC: 13.7 10*3/uL — ABNORMAL HIGH (ref 3.8–10.8)

## 2022-12-23 LAB — COMPLETE METABOLIC PANEL WITH GFR
AG Ratio: 1.2 (calc) (ref 1.0–2.5)
ALT: 16 U/L (ref 9–46)
AST: 22 U/L (ref 10–35)
Albumin: 3.5 g/dL — ABNORMAL LOW (ref 3.6–5.1)
Alkaline phosphatase (APISO): 83 U/L (ref 35–144)
BUN: 12 mg/dL (ref 7–25)
CO2: 24 mmol/L (ref 20–32)
Calcium: 9.9 mg/dL (ref 8.6–10.3)
Chloride: 107 mmol/L (ref 98–110)
Creat: 0.92 mg/dL (ref 0.70–1.35)
Globulin: 2.9 g/dL (calc) (ref 1.9–3.7)
Glucose, Bld: 123 mg/dL — ABNORMAL HIGH (ref 65–99)
Potassium: 4.5 mmol/L (ref 3.5–5.3)
Sodium: 141 mmol/L (ref 135–146)
Total Bilirubin: 0.5 mg/dL (ref 0.2–1.2)
Total Protein: 6.4 g/dL (ref 6.1–8.1)
eGFR: 91 mL/min/{1.73_m2} (ref >=60)

## 2022-12-23 LAB — PSA: PSA: 1.05 ng/mL (ref <=4.00)

## 2022-12-23 LAB — LIPID PANEL
Cholesterol: 110 mg/dL (ref <200)
HDL: 33 mg/dL — ABNORMAL LOW (ref >=40)
LDL Cholesterol (Calc): 53 mg/dL (calc)
Non-HDL Cholesterol (Calc): 77 mg/dL (calc) (ref <130)
Total CHOL/HDL Ratio: 3.3 (calc) (ref <5.0)
Triglycerides: 158 mg/dL — ABNORMAL HIGH (ref <150)

## 2022-12-23 LAB — TSH: TSH: 2.79 mIU/L (ref 0.40–4.50)

## 2022-12-23 LAB — HEMOGLOBIN A1C
Hgb A1c MFr Bld: 6.1 % of total Hgb — ABNORMAL HIGH (ref <5.7)
Mean Plasma Glucose: 128 mg/dL
eAG (mmol/L): 7.1 mmol/L

## 2022-12-23 LAB — VITAMIN D 25 HYDROXY (VIT D DEFICIENCY, FRACTURES): Vit D, 25-Hydroxy: 76 ng/mL (ref 30–100)

## 2022-12-29 ENCOUNTER — Other Ambulatory Visit: Payer: Self-pay | Admitting: Family Medicine

## 2022-12-29 ENCOUNTER — Ambulatory Visit (INDEPENDENT_AMBULATORY_CARE_PROVIDER_SITE_OTHER): Payer: Medicare HMO | Admitting: Family Medicine

## 2022-12-29 ENCOUNTER — Encounter: Payer: Self-pay | Admitting: Family Medicine

## 2022-12-29 VITALS — BP 122/66 | HR 107 | Ht 72.0 in | Wt 150.0 lb

## 2022-12-29 DIAGNOSIS — Z0000 Encounter for general adult medical examination without abnormal findings: Secondary | ICD-10-CM

## 2022-12-29 DIAGNOSIS — I6930 Unspecified sequelae of cerebral infarction: Secondary | ICD-10-CM | POA: Diagnosis not present

## 2022-12-29 DIAGNOSIS — R7309 Other abnormal glucose: Secondary | ICD-10-CM

## 2022-12-29 DIAGNOSIS — J3089 Other allergic rhinitis: Secondary | ICD-10-CM | POA: Diagnosis not present

## 2022-12-29 DIAGNOSIS — I951 Orthostatic hypotension: Secondary | ICD-10-CM

## 2022-12-29 DIAGNOSIS — E782 Mixed hyperlipidemia: Secondary | ICD-10-CM | POA: Diagnosis not present

## 2022-12-29 DIAGNOSIS — H6991 Unspecified Eustachian tube disorder, right ear: Secondary | ICD-10-CM | POA: Diagnosis not present

## 2022-12-29 DIAGNOSIS — Z122 Encounter for screening for malignant neoplasm of respiratory organs: Secondary | ICD-10-CM

## 2022-12-29 DIAGNOSIS — D649 Anemia, unspecified: Secondary | ICD-10-CM

## 2022-12-29 MED ORDER — ROSUVASTATIN CALCIUM 40 MG PO TABS: 40.0000 mg | ORAL_TABLET | ORAL | 3 refills | Status: DC

## 2022-12-29 MED ORDER — LORATADINE 10 MG PO TABS: 10.0000 mg | ORAL_TABLET | Freq: Every day | ORAL | 11 refills | Status: DC

## 2022-12-29 MED ORDER — FLUTICASONE PROPIONATE 50 MCG/ACT NA SUSP: 2.0000 | Freq: Every day | NASAL | 3 refills | Status: DC

## 2022-12-29 NOTE — Assessment & Plan Note (Signed)
Stable to improved on current regimen Followed by Cards On Midodrine  THREE TIMES A DAY On Florinef 0.1mg  daily Improved fluid intake, salt Precautions reviewed

## 2022-12-29 NOTE — Assessment & Plan Note (Addendum)
Stable without new concern. On medication management for future secondary prevention

## 2022-12-29 NOTE — Patient Instructions (Addendum)
Thank you for coming to the office today.  Mild lower Hemoglobin, this is considered mild anemia I am not convinced it is a problem. No treatment needed. Keep improving nutrition. We will repeat in 6 months.  Recent Labs    12/22/22 0808  HGBA1C 6.1*   Goal to limit excess sweets to reduce blood sugar.  Meds ordered to [facility]  Start nasal steroid Flonase 2 sprays in each nostril daily for 4-6 weeks, may repeat course seasonally or as needed  Loratadine Claritin OTC  daily  Eustachian Tube Dysfunction fluid behind the Right ear from allergies.  ----  OKAY to take Rosuvastatin  every OTHER NIGHT. New order.  Keep on the same BP medications, with plan from Cardiology.  Referral to [facility] Pulmonology for Lung CA Screening imaging CT  DUE for FASTING BLOOD WORK (no food or drink after midnight before the lab appointment, only water or coffee without cream/sugar on the morning of)  SCHEDULE "Lab Only" visit in the morning at the clinic for lab draw in 6 MONTHS   - Make sure Lab Only appointment is at about 1 week before your next appointment, so that results will be available  For Lab Results, once available within 2-3 days of blood draw, you can can log in to MyChart online to view your results and a brief explanation. Also, we can discuss results at next follow-up visit.    Please schedule a Follow-up Appointment to: Return in about 6 months (around 06/30/2023) for 6 month fasting labs then 1 week later PreDM, Anemia, BP/Hypotension, History CVA.  If you have any other questions or concerns, please feel free to call the office or send a message through MyChart. You may also schedule an earlier appointment if necessary.  Additionally, you may be receiving a survey about your experience at our office within a few days to 1 week by e-mail or mail. We value your feedback.  Saralyn Pilar, DO [facility], [place]

## 2022-12-29 NOTE — Assessment & Plan Note (Addendum)
Controlled LDL 50s, TG improved now 150s On intermittent Statin therapy The ASCVD Risk score (Arnett DK, et al., 2019) failed to calculate for the following reasons:   The patient has a prior MI or stroke diagnosis  Plan: 1. Continue Rosuvastatin  every OTHER DAY 2. Encourage improved lifestyle - low carb/cholesterol emphasized DASH / Mediterranean diet options, reduce portion size, improve regular exercise  Re order statin accordingly.

## 2022-12-29 NOTE — Progress Notes (Signed)
Subjective:    [name], male    DOB: 08-27-1956, 67 y.o.   MRN: [id number]  Ethan Fitzgerald is a 67 y.o. male presenting on 12/29/2022 for Annual Exam and Ear Fullness (Right side)   HPI   Here for Annual Physical and Lab Review.   HYPERLIPIDEMIA: - Reports no concerns. Last lipid panel 12/2021, improved controlled LDL and TG improved - Currently taking Rosuvastatin 40mg  intermittent every OTHER NIGHT, tolerating well without side effects or myalgias   Elevated A1c A1c up to 6.1, prior 5.7 range Admits still eating inc chocolate  Post-CVA, residual deficits He admits still difficulty with balance at times, but working on it. He has difficulty with Left sided residual weakness still gradually working on this.  Upcoming visit next week MRI Head  Postural Dizziness / Orthostatic Hypotension Weight Loss  Last visit with me 10/19/22, we discussed keeping Florinef daily 0.1mg  daily and improve adherence Last seen by Dr Mariah M[facility] 12/2022, he added Midodrine 5mg  daily 6am and then take up to 3 times per day while he is active  Weight improved and stabilized, improved appetite    Lumbar Radiculopathy Herinated Disc Last seen by Nicki Reaper, FNP on 09/17/22 , given toradol, methocarbamol, and prednisone taper, inadequate response, went to hospital 09/24/22 and had imaging and further work up, now seen by [facility] Dr Noralyn Pick for further management of lumbar back. - Awaiting further management consider surgery or other options.  Anemia, normocytic Mild reduced Hgb, some fluctuation with labs slight increase WBC related to other triggers with allergy sinus most likely No other concern for blood loss or anemia   Additional history   Health Maintenance: PSA 1.05 negative (12/2022)     12/29/2022   10:39 AM 11/05/2022    8:21 AM 10/19/2022   11:22 AM  Depression screen PHQ 2/9  Decreased Interest 0 0 0  Down, Depressed, Hopeless 0 0 0  PHQ - 2 Score 0  0 0  Altered sleeping  0 0  Tired, decreased energy  0 2  Change in appetite  0 0  Feeling bad or failure about yourself   0 0  Trouble concentrating  0 0  Moving slowly or fidgety/restless  0 0  Suicidal thoughts  0 0  PHQ-9 Score  0 2  Difficult doing work/chores  Not difficult at all Not difficult at all    Past Medical History:  Diagnosis Date   COPD (chronic obstructive pulmonary disease)    mild   COVID-19 09/2019   Erectile dysfunction    Headache    daily, stress headaches   Knee pain, left    Seasonal allergies    Stroke    Tobacco dependence    Past Surgical History:  Procedure Laterality Date   BACK SURGERY     metal plate in back   COLONOSCOPY     COLONOSCOPY WITH PROPOFOL N/A 04/26/2019   Procedure: COLONOSCOPY WITH PROPOFOL;  Surgeon: Wyline Mood, MD;  Location: [facility] END[facility]COPY;  Service: Gastroenterology;  Laterality: N/A;   HERNIA REPAIR Right    IR ANGIO INTRA EXTRACRAN SEL COM CAROTID INNOMINATE UNI L MOD SED  03/27/2020   IR ANGIO VERTEBRAL SEL SUBCLAVIAN INNOMINATE UNI L MOD SED  03/27/2020   IR ANGIO VERTEBRAL SEL VERTEBRAL UNI R MOD SED  03/27/2020   IR CT HEAD LTD  03/27/2020   IR PERCUTANEOUS ART THROMBECTOMY/INFUSION INTRACRANIAL INC DIAG ANGIO  03/27/2020   KNEE ARTHROSCOPY Left 03/28/2015  Procedure: Arthroscopic partial medial meniscectomy plus chondral debridement;  Surgeon: Erin Sons, MD;  Location: [facility];  Service: Orthopedics;  Laterality: Left;   RADIOLOGY WITH ANESTHESIA N/A 03/27/2020   Procedure: IR WITH ANESTHESIA;  Surgeon: Julieanne Cotton, MD;  Location: [facility] OR;  Service: Radiology;  Laterality: N/A;   Social History   Socioeconomic History   Marital status: Married    Spouse name: Aurea Graff   Number of children: 0   Years of education: Not on file   Highest education level: Some college, no degree  Occupational History   Not on file  Tobacco Use   Smoking status: Former    Packs/day: 1.50    Years: 49.00     Additional pack years: 0.00    Total pack years: 73.50    Types: Cigarettes    Start date: 36    Quit date: 09/14/2017    Years since quitting: 5.2   Smokeless tobacco: Former  Building services engineer Use: Never used  Substance and Sexual Activity   Alcohol use: Yes    Comment: 6 pack on weekend   Drug use: No   Sexual activity: Not on file  Other Topics Concern   Not on file  Social History Narrative   Lives at home w wife   L handed   Caffeine: 1 C of coffee AM   Social Determinants of Health   Financial Resource Strain: Low Risk  (11/05/2022)   Overall Financial Resource Strain (CARDIA)    Difficulty of Paying Living Expenses: Not hard at all  Food Insecurity: No Food Insecurity (11/05/2022)   Hunger Vital Sign    Worried About Running Out of Food in the Last Year: Never true    Ran Out of Food in the Last Year: Never true  Transportation Needs: No Transportation Needs (11/05/2022)   PRAPARE - Administrator, Civil Service (Medical): No    Lack of Transportation (Non-Medical): No  Physical Activity: Insufficiently Active (11/05/2022)   Exercise Vital Sign    Days of Exercise per Week: 3 days    Minutes of Exercise per Session: 20 min  Stress: No Stress Concern Present (11/05/2022)   [employer] of Occupational Health - Occupational Stress Questionnaire    Feeling of Stress : Not at all  Social Connections: Socially Isolated (11/05/2022)   Social Connection and Isolation Panel [NHANES]    Frequency of Communication with Friends and Family: Once a week    Frequency of Social Gatherings with Friends and Family: Never    Attends Religious Services: Never    Database administrator or Organizations: No    Attends Banker Meetings: Never    Marital Status: Married  Catering manager Violence: Not At Risk (11/05/2022)   Humiliation, Afraid, Rape, and Kick questionnaire    Fear of Current or Ex-Partner: No    Emotionally Abused: No    Physically Abused: No     Sexually Abused: No   Family History  Problem Relation Age of Onset   Diabetes Mother    Heart attack Mother 57   Cancer Father        liver    COPD Brother    HIV/AIDS Brother    Prostate cancer Neg Hx    Colon cancer Neg Hx    Current Outpatient Medications on File Prior to Visit  Medication Sig   albuterol (VENTOLIN HFA) 108 (90 Base) MCG/ACT inhaler INHALE 1 TO 2 PUFFS EVERY 4 HOURS  AS NEEDED FOR WHEEZING OR SHORTNESS OF BREATH   aspirin EC 325 MG EC tablet Take 1 tablet (325 mg total) by mouth daily.   cholecalciferol (VITAMIN D3) 25 MCG (1000 UNIT) tablet Take 1,000 Units by mouth daily.   fludrocortisone (FLORINEF) 0.1 MG tablet Take 0.1 mg by mouth daily.   methocarbamol (ROBAXIN) 500 MG tablet Take 1 tablet (500 mg total) by mouth 4 (four) times daily.   midodrine (PROAMATINE) 5 MG tablet Take 1 tablet (5mg ) once daily at 6:00 am. If still dizzy 1-2 hours then take an extra 1 tablet (5mg ) as needed.   naproxen (NAPROSYN) 500 MG tablet Take 1 tablet (500 mg total) by mouth 2 (two) times daily with a meal.   OVER THE COUNTER MEDICATION Equate allergy med   sildenafil (REVATIO) 20 MG tablet TAKE 1 TO 5 TABLETS BY MOUTH ABOUT 30 MINUTES PRIOR TO SEX.START WITH 1 THEN INCREASE   STIOLTO RESPIMAT 2.5-2.5 MCG/ACT AERS INHALE 2 PUFFS INTO THE LUNGS DAILY.   traMADol (ULTRAM) 50 MG tablet Take 50 mg by mouth every 8 (eight) hours.   vitamin B-12 (CYANOCOBALAMIN) 1000 MCG tablet Take 1 tablet (1,000 mcg total) by mouth daily.   vitamin C (ASCORBIC ACID) 500 MG tablet Take 500 mg by mouth daily. Am   No current facility-administered medications on file prior to visit.    Review of Systems  Constitutional:  Negative for activity change, appetite change, chills, diaphoresis, fatigue and fever.  HENT:  Negative for congestion and hearing loss.   Eyes:  Negative for visual disturbance.  Respiratory:  Negative for cough, chest tightness, shortness of breath and wheezing.    Cardiovascular:  Negative for chest pain, palpitations and leg swelling.  Gastrointestinal:  Negative for abdominal pain, constipation, diarrhea, nausea and vomiting.  Genitourinary:  Negative for dysuria, frequency and hematuria.  Musculoskeletal:  Negative for arthralgias and neck pain.  Skin:  Negative for rash.  Neurological:  Negative for dizziness, weakness, light-headedness, numbness and headaches.  Hematological:  Negative for adenopathy.  Psychiatric/Behavioral:  Negative for behavioral problems, dysphoric mood and sleep disturbance.    Per HPI unless specifically indicated above      Objective:    BP 122/66   Pulse (!) 107   Ht 6' (1.829 m)   Wt 150 lb (68 kg)   SpO2 99%   BMI 20.34 kg/m   Wt Readings from Last 3 Encounters:  12/29/22 150 lb (68 kg)  12/14/22 155 lb (70.3 kg)  12/07/22 150 lb (68 kg)    Physical Exam Vitals and nursing note reviewed.  Constitutional:      General: He is not in acute distress.    Appearance: He is well-developed. He is not diaphoretic.     Comments: Thin, Well-appearing, comfortable, cooperative  HENT:     Head: Normocephalic and atraumatic.     Right Ear: Ear canal and external ear normal. There is no impacted cerumen.     Left Ear: Ear canal and external ear normal. There is no impacted cerumen.     Ears:     Comments: R TM with eustachian tube dysfunction Eyes:     General:        Right eye: No discharge.        Left eye: No discharge.     Conjunctiva/sclera: Conjunctivae normal.     Pupils: Pupils are equal, round, and reactive to light.  Neck:     Thyroid: No thyromegaly.  Cardiovascular:  Rate and Rhythm: Normal rate and regular rhythm.     Pulses: Normal pulses.     Heart sounds: Normal heart sounds. No murmur heard. Pulmonary:     Effort: Pulmonary effort is normal. No respiratory distress.     Breath sounds: Normal breath sounds. No wheezing or rales.  Abdominal:     General: Bowel sounds are normal. There  is no distension.     Palpations: Abdomen is soft. There is no mass.     Tenderness: There is no abdominal tenderness.  Musculoskeletal:        General: No tenderness. Normal range of motion.     Cervical back: Normal range of motion and neck supple.     Right lower leg: No edema.     Left lower leg: No edema.     Comments: Upper / Lower Extremities: - Normal muscle tone, strength bilateral upper extremities 5/5, lower extremities 5/5  Lymphadenopathy:     Cervical: No cervical adenopathy.  Skin:    General: Skin is warm and dry.     Findings: No erythema or rash.  Neurological:     Mental Status: He is alert and oriented to person, place, and time.     Comments: Distal sensation intact to light touch all extremities  Psychiatric:        Mood and Affect: Mood normal.        Behavior: Behavior normal.        Thought Content: Thought content normal.     Comments: Well groomed, good eye contact, normal speech and thoughts      Results for orders placed or performed in visit on 12/22/22  VITAMIN D 25 Hydroxy (Vit-D Deficiency, Fractures)  Result Value Ref Range   Vit D, 25-Hydroxy 76 30 - 100 ng/mL  TSH  Result Value Ref Range   TSH 2.79 0.40 - 4.50 mIU/L  PSA  Result Value Ref Range   PSA 1.05 < OR = 4.00 ng/mL  Hemoglobin A1c  Result Value Ref Range   Hgb A1c MFr Bld 6.1 (H) <5.7 % of total Hgb   Mean Plasma Glucose 128 mg/dL   eAG (mmol/L) 7.1 mmol/L  Lipid panel  Result Value Ref Range   Cholesterol 110 <200 mg/dL   HDL 33 (L) > OR = 40 mg/dL   Triglycerides 409 (H) <150 mg/dL   LDL Cholesterol (Calc) 53 mg/dL (calc)   Total CHOL/HDL Ratio 3.3 <5.0 (calc)   Non-HDL Cholesterol (Calc) 77 <811 mg/dL (calc)  CBC with Differential/Platelet  Result Value Ref Range   WBC 13.7 (H) 3.8 - 10.8 Thousand/uL   RBC 4.17 (L) 4.20 - 5.80 Million/uL   Hemoglobin 12.3 (L) 13.2 - 17.1 g/dL   HCT 91.4 78.2 - 95.6 %   MCV 92.8 80.0 - 100.0 fL   MCH 29.5 27.0 - 33.0 pg   MCHC  31.8 (L) 32.0 - 36.0 g/dL   RDW 21.3 08.6 - 57.8 %   Platelets 531 (H) 140 - 400 Thousand/uL   MPV 11.6 7.5 - 12.5 fL   Neutro Abs 10,207 (H) 1,500 - 7,800 cells/uL   Lymphs Abs 2,302 850 - 3,900 cells/uL   Absolute Monocytes 822 200 - 950 cells/uL   Eosinophils Absolute 301 15 - 500 cells/uL   Basophils Absolute 69 0 - 200 cells/uL   Neutrophils Relative % 74.5 %   Total Lymphocyte 16.8 %   Monocytes Relative 6.0 %   Eosinophils Relative 2.2 %   Basophils Relative 0.5 %  COMPLETE METABOLIC PANEL WITH GFR  Result Value Ref Range   Glucose, Bld 123 (H) 65 - 99 mg/dL   BUN 12 7 - 25 mg/dL   Creat 1.61 0.96 - 0.45 mg/dL   eGFR 91 > OR = 60 WU/JWJ/1.91Y7   BUN/Creatinine Ratio SEE NOTE: 6 - 22 (calc)   Sodium 141 135 - 146 mmol/L   Potassium 4.5 3.5 - 5.3 mmol/L   Chloride 107 98 - 110 mmol/L   CO2 24 20 - 32 mmol/L   Calcium 9.9 8.6 - 10.3 mg/dL   Total Protein 6.4 6.1 - 8.1 g/dL   Albumin 3.5 (L) 3.6 - 5.1 g/dL   Globulin 2.9 1.9 - 3.7 g/dL (calc)   AG Ratio 1.2 1.0 - 2.5 (calc)   Total Bilirubin 0.5 0.2 - 1.2 mg/dL   Alkaline phosphatase (APISO) 83 35 - 144 U/L   AST 22 10 - 35 U/L   ALT 16 9 - 46 U/L      Assessment & Plan:   Problem List Items Addressed This Visit     Elevated hemoglobin A1c    A1c up to 6.1, elevated At risk of PreDM in future Concern HLD HyperTG, likely familial Concern now with some possible background diabetic retinopathy  Plan:  1. Not on any therapy currently  2. Encourage improved lifestyle - low carb, low sugar diet, reduce portion size, continue improving regular exercise - Emphasis on reducing chocolate and beer also REDUCE fried foods      History of cerebrovascular accident (CVA) with residual deficit    Stable without new concern. On medication management for future secondary prevention      Relevant Medications   rosuvastatin (CRESTOR) 40 MG tablet   Hyperlipidemia    Controlled LDL 50s, TG improved now 150s On intermittent  Statin therapy The ASCVD Risk score (Arnett DK, et al., 2019) failed to calculate for the following reasons:   The patient has a prior MI or stroke diagnosis  Plan: 1. Continue Rosuvastatin 40mg  every OTHER DAY 2. Encourage improved lifestyle - low carb/cholesterol emphasized DASH / Mediterranean diet options, reduce portion size, improve regular exercise  Re order statin accordingly.      Relevant Medications   rosuvastatin (CRESTOR) 40 MG tablet   Hypotension    Stable to improved on current regimen Followed by Cards On Midodrine 5mg  THREE TIMES A DAY On Florinef 0.1mg  daily Improved fluid intake, salt Precautions reviewed      Relevant Medications   rosuvastatin (CRESTOR) 40 MG tablet   Other Visit Diagnoses     Annual physical exam    -  Primary   Dysfunction of right eustachian tube       Relevant Medications   fluticasone (FLONASE) 50 MCG/ACT nasal spray   loratadine (CLARITIN) 10 MG tablet   Environmental and seasonal allergies       Relevant Medications   fluticasone (FLONASE) 50 MCG/ACT nasal spray   loratadine (CLARITIN) 10 MG tablet   Screening for lung cancer       Relevant Orders   Ambulatory Referral for Lung Cancer Scre       Updated Health Maintenance information Reviewed recent lab results with patient Encouraged improvement to lifestyle with diet and exercise Goal of weight loss  Eustachian Tube Dysfunction, R>L Sinuses / Allergies  Start nasal steroid Flonase 2 sprays in each nostril daily for 4-6 weeks, may repeat course seasonally or as needed Loratadine  Repeat CBC and anemia panel in 6 months.  Orders Placed This Encounter  Procedures   Ambulatory Referral for Lung Cancer Scre    Referral Priority:   Routine    Referral Type:   Consultation    Referral Reason:   Specialty Services Required    Number of Visits Requested:   1     Meds ordered this encounter  Medications   rosuvastatin (CRESTOR) 40 MG tablet    Sig: Take 1 tablet  (40 mg total) by mouth every other day.    Dispense:  45 tablet    Refill:  3   fluticasone (FLONASE) 50 MCG/ACT nasal spray    Sig: Place 2 sprays into both nostrils daily.    Dispense:  48 g    Refill:  3   loratadine (CLARITIN) 10 MG tablet    Sig: Take 1 tablet (10 mg total) by mouth daily. Use for 4-6 weeks then stop, and use as needed or seasonally    Dispense:  30 tablet    Refill:  11      Follow up plan: Return in about 6 months (around 06/30/2023) for 6 month fasting labs then 1 week later PreDM, Anemia, BP/Hypotension, History CVA.  Repeat labs CBC A1c 6 month   Saralyn Pilar, DO [facility] 12/29/2022, 10:51 AM

## 2022-12-29 NOTE — Assessment & Plan Note (Signed)
A1c up to 6.1, elevated At risk of PreDM in future Concern HLD HyperTG, likely familial Concern now with some possible background diabetic retinopathy  Plan:  1. Not on any therapy currently  2. Encourage improved lifestyle - low carb, low sugar diet, reduce portion size, continue improving regular exercise - Emphasis on reducing chocolate and beer also REDUCE fried foods

## 2022-12-30 ENCOUNTER — Ambulatory Visit (HOSPITAL_BASED_OUTPATIENT_CLINIC_OR_DEPARTMENT_OTHER)
Admission: RE | Admit: 2022-12-30 | Discharge: 2022-12-30 | Disposition: A | Discharge status: Home / Self Care | Payer: Medicare HMO | Source: Ambulatory Visit | Attending: Neurology | Admitting: Neurology

## 2022-12-30 ENCOUNTER — Ambulatory Visit (HOSPITAL_COMMUNITY)
Admission: RE | Admit: 2022-12-30 | Discharge: 2022-12-30 | Disposition: A | Discharge status: Home / Self Care | Payer: Medicare HMO | Source: Ambulatory Visit | Attending: Neurology | Admitting: Neurology

## 2022-12-30 DIAGNOSIS — I6521 Occlusion and stenosis of right carotid artery: Secondary | ICD-10-CM

## 2023-01-07 NOTE — Progress Notes (Signed)
Kindly inform the patient that that transcranial Doppler study was mostly normal except low flow in one of the blood vessels on the left side which may be because of mild narrowing.  Nothing to worry about

## 2023-01-07 NOTE — Progress Notes (Signed)
Kindly inform the patient that carotid ultrasound shows known chronic occlusion of the right carotid.  The left carotid which was previously thought to be occluded may have developed some partial limited flow.  Nothing to worry about

## 2023-01-10 ENCOUNTER — Telehealth: Payer: Self-pay

## 2023-01-10 ENCOUNTER — Encounter: Payer: Self-pay | Admitting: Neurology

## 2023-01-10 NOTE — Telephone Encounter (Signed)
I spoke with the patient and informed him of the results of the transcranial doppler study. He verbalized understanding of the findings and expressed appreciation for the call. All questions answered.

## 2023-01-10 NOTE — Telephone Encounter (Signed)
-----   Message from Deatra James, RN sent at 01/10/2023  9:34 AM EDT -----  ----- Message ----- From: Micki Riley, MD Sent: 01/07/2023   8:10 AM EDT To: Gna-Pod 2 Results  Kindly inform the patient that that transcranial Doppler study was mostly normal except low flow in one of the blood vessels on the left side which may be because of mild narrowing.  Nothing to worry about

## 2023-01-11 DIAGNOSIS — H524 Presbyopia: Secondary | ICD-10-CM | POA: Diagnosis not present

## 2023-01-11 DIAGNOSIS — Z0100 Encounter for examination of eyes and vision without abnormal findings: Secondary | ICD-10-CM | POA: Diagnosis not present

## 2023-01-26 ENCOUNTER — Ambulatory Visit (INDEPENDENT_AMBULATORY_CARE_PROVIDER_SITE_OTHER): Payer: Medicare HMO | Admitting: Internal Medicine

## 2023-01-26 ENCOUNTER — Encounter: Payer: Self-pay | Admitting: Internal Medicine

## 2023-01-26 VITALS — BP 92/54 | HR 93 | Temp 97.4°F | Wt 147.0 lb

## 2023-01-26 DIAGNOSIS — R0781 Pleurodynia: Secondary | ICD-10-CM

## 2023-01-26 DIAGNOSIS — R0789 Other chest pain: Secondary | ICD-10-CM | POA: Diagnosis not present

## 2023-01-26 MED ORDER — KETOROLAC TROMETHAMINE 30 MG/ML IJ SOLN
30.0000 mg | Freq: Once | INTRAMUSCULAR | Status: AC
  Administered 2023-01-26: 30 mg via INTRAMUSCULAR

## 2023-01-26 MED ORDER — CYCLOBENZAPRINE HCL 10 MG PO TABS: 10.0000 mg | ORAL_TABLET | Freq: Three times a day (TID) | ORAL | 0 refills | Status: DC | PRN

## 2023-01-26 MED ORDER — PREDNISONE 10 MG PO TABS: ORAL_TABLET | ORAL | 0 refills | Status: DC

## 2023-01-26 NOTE — Patient Instructions (Signed)
   Chest Wall Pain Chest wall pain is pain in or around the bones and muscles of your chest. Chest wall pain may be caused by: An injury. Coughing a lot. Using your chest and arm muscles too much. Sometimes, the cause may not be known. This pain may take a few weeks or longer to get better. Follow these instructions at home: Managing pain, stiffness, and swelling If told, put ice on the painful area: Put ice in a plastic bag. Place a towel between your skin and the bag. Leave the ice on for 20 minutes, 2-3 times a day.  Activity Rest as told by your doctor. Avoid doing things that cause pain. This includes lifting heavy items. Ask your doctor what activities are safe for you. General instructions  Take over-the-counter and prescription medicines only as told by your doctor. Do not use any products that contain nicotine or tobacco, such as cigarettes, e-cigarettes, and chewing tobacco. If you need help quitting, ask your doctor. Keep all follow-up visits as told by your doctor. This is important. Contact a doctor if: You have a fever. Your chest pain gets worse. You have new symptoms. Get help right away if: You feel sick to your stomach (nauseous) or you throw up (vomit). You feel sweaty or light-headed. You have a cough with mucus from your lungs (sputum) or you cough up blood. You are short of breath. These symptoms may be an emergency. Do not wait to see if the symptoms will go away. Get medical help right away. Call your local emergency services (911 in the [place] Do not drive yourself to the hospital. Summary Chest wall pain is pain in or around the bones and muscles of your chest. It may be treated with ice, rest, and medicines. Your condition may also get better if you avoid doing things that cause pain. Contact a doctor if you have a fever, chest pain that gets worse, or new symptoms. Get help right away if you feel light-headed or you get short of breath. These symptoms  may be an emergency. This information is not intended to replace advice given to you by your health care provider. Make sure you discuss any questions you have with your health care provider. Document Revised: 11/25/2020 Document Reviewed: 11/07/2020 Elsevier Patient Education  2023 [employer]  

## 2023-01-26 NOTE — Addendum Note (Signed)
Addended by: Kavin Leech E on: 01/26/2023 03:06 PM   Modules accepted: Orders

## 2023-01-26 NOTE — Progress Notes (Signed)
Subjective:    [name], male    DOB: August 12, 1956, 67 y.o.   MRN: [id number]  HPI  Patient presents to clinic today with complaint of right side chest wall pain.  This started yesterday.  He describes the pain as a constant sharp pain. The pain is not worse with certain movement. It improves with stretching his arms backwards. He denies numbness, tingling or weakness of his upper extremities. He denies nausea, vomiting, or heartburn. He denies cough or shortness of breath. He does not feel like this is his heart. He has tried Baclofen with minimal relief of symptoms. He has chronic back pain, currently doing PT for this. He reports he worked on strengthening his upper extremities yesterday. He did not feel any pain while doing PT, but these stretches were new and feels like they may have contributed. He has a history of COPD, HLD status post stroke.  ECG from 12/2022 reviewed  Review of Systems     Past Medical History:  Diagnosis Date   COPD (chronic obstructive pulmonary disease) (HCC)    mild   COVID-19 09/2019   Erectile dysfunction    Headache    daily, stress headaches   Knee pain, left    Seasonal allergies    Stroke (HCC)    Tobacco dependence     Current Outpatient Medications  Medication Sig Dispense Refill   albuterol (VENTOLIN HFA) 108 (90 Base) MCG/ACT inhaler INHALE 1 TO 2 PUFFS EVERY 4 HOURS AS NEEDED FOR WHEEZING OR SHORTNESS OF BREATH 1 each 2   aspirin EC 325 MG EC tablet Take 1 tablet (325 mg total) by mouth daily. 90 tablet 0   cholecalciferol (VITAMIN D3) 25 MCG (1000 UNIT) tablet Take 1,000 Units by mouth daily.     fludrocortisone (FLORINEF) 0.1 MG tablet Take 0.1 mg by mouth daily.     fluticasone (FLONASE) 50 MCG/ACT nasal spray Place 2 sprays into both nostrils daily. 48 g 3   loratadine (CLARITIN) 10 MG tablet Take 1 tablet (10 mg total) by mouth daily. Use for 4-6 weeks then stop, and use as needed or seasonally 30 tablet 11    methocarbamol (ROBAXIN) 500 MG tablet Take 1 tablet (500 mg total) by mouth 4 (four) times daily. 20 tablet 0   midodrine (PROAMATINE) 5 MG tablet Take 1 tablet (5mg ) once daily at 6:00 am. If still dizzy 1-2 hours then take an extra 1 tablet (5mg ) as needed. 90 tablet 2   naproxen (NAPROSYN) 500 MG tablet Take 1 tablet (500 mg total) by mouth 2 (two) times daily with a meal. 10 tablet 0   OVER THE COUNTER MEDICATION Equate allergy med     rosuvastatin (CRESTOR) 40 MG tablet Take 1 tablet (40 mg total) by mouth every other day. 45 tablet 3   sildenafil (REVATIO) 20 MG tablet TAKE 1 TO 5 TABLETS BY MOUTH ABOUT 30 MINUTES PRIOR TO SEX.START WITH 1 THEN INCREASE 30 tablet 5   STIOLTO RESPIMAT 2.5-2.5 MCG/ACT AERS INHALE 2 PUFFS INTO THE LUNGS DAILY. 12 g 3   traMADol (ULTRAM) 50 MG tablet Take 50 mg by mouth every 8 (eight) hours.     vitamin B-12 (CYANOCOBALAMIN) 1000 MCG tablet Take 1 tablet (1,000 mcg total) by mouth daily.     vitamin C (ASCORBIC ACID) 500 MG tablet Take 500 mg by mouth daily. Am     No current facility-administered medications for this visit.    No Known Allergies  Family  History  Problem Relation Age of Onset   Diabetes Mother    Heart attack Mother 68   Cancer Father        liver    COPD Brother    HIV/AIDS Brother    Prostate cancer Neg Hx    Colon cancer Neg Hx     Social History   Socioeconomic History   Marital status: Married    Spouse name: Aurea Graff   Number of children: 0   Years of education: Not on file   Highest education level: Some college, no degree  Occupational History   Not on file  Tobacco Use   Smoking status: Former    Packs/day: 1.50    Years: 49.00    Additional pack years: 0.00    Total pack years: 73.50    Types: Cigarettes    Start date: 71    Quit date: 09/14/2017    Years since quitting: 5.3   Smokeless tobacco: Former  Building services engineer Use: Never used  Substance and Sexual Activity   Alcohol use: Yes    Comment: 6  pack on weekend   Drug use: No   Sexual activity: Not on file  Other Topics Concern   Not on file  Social History Narrative   Lives at home w wife   L handed   Caffeine: 1 C of coffee AM   Social Determinants of Health   Financial Resource Strain: Low Risk  (11/05/2022)   Overall Financial Resource Strain (CARDIA)    Difficulty of Paying Living Expenses: Not hard at all  Food Insecurity: No Food Insecurity (11/05/2022)   Hunger Vital Sign    Worried About Running Out of Food in the Last Year: Never true    Ran Out of Food in the Last Year: Never true  Transportation Needs: No Transportation Needs (11/05/2022)   PRAPARE - Administrator, Civil Service (Medical): No    Lack of Transportation (Non-Medical): No  Physical Activity: Insufficiently Active (11/05/2022)   Exercise Vital Sign    Days of Exercise per Week: 3 days    Minutes of Exercise per Session: 20 min  Stress: No Stress Concern Present (11/05/2022)   [employer] of Occupational Health - Occupational Stress Questionnaire    Feeling of Stress : Not at all  Social Connections: Socially Isolated (11/05/2022)   Social Connection and Isolation Panel [NHANES]    Frequency of Communication with Friends and Family: Once a week    Frequency of Social Gatherings with Friends and Family: Never    Attends Religious Services: Never    Database administrator or Organizations: No    Attends Banker Meetings: Never    Marital Status: Married  Catering manager Violence: Not At Risk (11/05/2022)   Humiliation, Afraid, Rape, and Kick questionnaire    Fear of Current or Ex-Partner: No    Emotionally Abused: No    Physically Abused: No    Sexually Abused: No     Constitutional: Denies fever, malaise, fatigue, headache or abrupt weight changes.  HEENT: Denies eye pain, eye redness, ear pain, ringing in the ears, wax buildup, runny nose, nasal congestion, bloody nose, or sore throat. Respiratory: Denies difficulty  breathing, shortness of breath, cough or sputum production.   Cardiovascular: Denies chest pain, chest tightness, palpitations or swelling in the hands or feet.  Gastrointestinal: Denies abdominal pain, bloating, constipation, diarrhea or blood in the stool.  Musculoskeletal: Patient reports chronic back pain.  Denies decrease in range of motion, difficulty with gait, muscle pain or joint swelling.  Skin: Denies redness, rashes, lesions or ulcercations.  Neurological: Denies numbness, tingling, weakness or problems with balance and coordination.    No other specific complaints in a complete review of systems (except as listed in HPI above).  Objective:   Physical Exam BP (!) 92/54 (BP Location: Left Arm, Patient Position: Sitting, Cuff Size: Normal)   Pulse 93   Temp (!) 97.4 F (36.3 C) (Temporal)   Wt 147 lb (66.7 kg)   SpO2 98%   BMI 19.94 kg/m   Wt Readings from Last 3 Encounters:  12/29/22 150 lb (68 kg)  12/14/22 155 lb (70.3 kg)  12/07/22 150 lb (68 kg)    General: Appears his stated age, chronically ill appearing, in NAD. Skin: Warm, dry and intact. No rashes noted. HEENT: Head: normal shape and size; Eyes: sclera white, no icterus, conjunctiva pink, PERRLA and EOMs intact;  Cardiovascular: Normal rate and rhythm. S1,S2 noted.  No murmur, rubs or gallops noted.  Pulmonary/Chest: Normal effort and positive vesicular breath sounds. No respiratory distress. No wheezes, rales or ronchi noted.  Abdomen: Soft and nontender. Normal bowel sounds.  Musculoskeletal: Normal internal and external rotation of the right shoulder.  No pain with palpation of the right shoulder.  No pain with palpation of the sternum.  He does have some pain with palpation of the ribs and cartilage on the right side just adjacent to the sternum.  Strength 5/5 BUE.  Handgrips equal.  No difficulty with gait.  Neurological: Alert and oriented. Coordination normal.      BMET    Component Value Date/Time    NA 141 12/22/2022 0808   K 4.5 12/22/2022 0808   CL 107 12/22/2022 0808   CO2 24 12/22/2022 0808   GLUCOSE 123 (H) 12/22/2022 0808   BUN 12 12/22/2022 0808   CREATININE 0.92 12/22/2022 0808   CALCIUM 9.9 12/22/2022 0808   GFRNONAA 74 12/22/2020 0801   GFRAA 85 12/22/2020 0801    Lipid Panel     Component Value Date/Time   CHOL 110 12/22/2022 0808   TRIG 158 (H) 12/22/2022 0808   HDL 33 (L) 12/22/2022 0808   CHOLHDL 3.3 12/22/2022 0808   VLDL 23 03/27/2020 0823   LDLCALC 53 12/22/2022 0808    CBC    Component Value Date/Time   WBC 13.7 (H) 12/22/2022 0808   RBC 4.17 (L) 12/22/2022 0808   HGB 12.3 (L) 12/22/2022 0808   HCT 38.7 12/22/2022 0808   PLT 531 (H) 12/22/2022 0808   MCV 92.8 12/22/2022 0808   MCH 29.5 12/22/2022 0808   MCHC 31.8 (L) 12/22/2022 0808   RDW 13.1 12/22/2022 0808   LYMPHSABS 2,302 12/22/2022 0808   MONOABS 0.7 04/02/2020 0548   EOSABS 301 12/22/2022 0808   BASOSABS 69 12/22/2022 0808    Hgb A1C Lab Results  Component Value Date   HGBA1C 6.1 (H) 12/22/2022            Assessment & Plan:  Chest Wall Pain, Rib Pain:  Indication for ECG: chest wall pain Interpretation of ECG: normal rate and rhythm, no concern for ACS Comparison of ECG: 12/2022, unhcanged DDx include muscle strain of chest wall, costochondritis Toradol 30 mg IM x 1 Rx for Pred taper x 6 days Rx for Flexeril 10 mg 3 times daily as needed-do not take with baclofen or methocarbamol He declines Rx for Lidoderm patches ER precautions discussed  Follow-up  with your PCP as previously scheduled Nicki Reaper, NP

## 2023-02-03 ENCOUNTER — Telehealth: Payer: Self-pay | Admitting: Cardiovascular Disease

## 2023-02-03 ENCOUNTER — Other Ambulatory Visit: Payer: Self-pay | Admitting: Family Medicine

## 2023-02-03 DIAGNOSIS — I951 Orthostatic hypotension: Secondary | ICD-10-CM

## 2023-02-03 NOTE — Telephone Encounter (Signed)
Pt c/o BP issue: STAT if pt c/o blurred vision, one-sided weakness or slurred speech  1. What are your last 5 BP readings? 110/72  2. Are you having any other symptoms (ex. Dizziness, headache, blurred vision, passed out)? Dizziness.   3. What is your BP issue? Patient's bp has been low and causing him to be dizzy and fall. Requesting call back to discuss. Believes this is from low bp.   Patient's wife says the last time patient fell was this morning.

## 2023-02-03 NOTE — Telephone Encounter (Signed)
Spoke to patient's spouse and she stated that the patient had gotten dizzy and fell Sunday 01/30/23 but was caught by son so no head injury sustained. Patient fell again this morning, 02/03/23 and was caught by spouse but cut arm. Patient's spouse stated that he had a CT of his head with his neurologist but they did not find anything to cause the dizziness and calling. She stated that his BP has been running low but he takes medications to increase it but it doesn't seem to be working. Patient's BP after fall this morning was 77/63 and this afternoon it was 107/74 HR 105 bpm. Urged spouse to take patient to nearest ED or UCC as he needs to be evaluated ASAP. She stated that the patient told her he does not want to go to the ED just to sit for hours. Informed her this is an emergency and he will not sit for hours having an emergency. She asked if there was an urgent care close by, informed there was one a [facility] at [address], [place], [place] [address]. She stated that she would take him there to be evaluated.

## 2023-02-03 NOTE — Telephone Encounter (Signed)
Requested medication (s) are due for refill today: routing for review  Requested medication (s) are on the active medication list: yes  Last refill:  12/14/22  Future visit scheduled: yes  Notes to clinic:  Unable to refill per protocol, cannot delegate. Last refill by another provider      Requested Prescriptions  Pending Prescriptions Disp Refills   midodrine (PROAMATINE) 5 MG tablet [Pharmacy Med Name: MIDODRINE HCL 5 MG TABLET] 270 tablet     Sig: TAKE 1 TABLET BY MOUTH 3 TIMES DAILY WITH MEALS     Not Delegated - Cardiovascular: Midodrine Failed - 02/03/2023  8:31 AM      Failed - This refill cannot be delegated      Passed - Cr in normal range and within 360 days    Creat  Date Value Ref Range Status  12/22/2022 0.92 0.70 - 1.35 mg/dL Final         Passed - ALT in normal range and within 360 days    ALT  Date Value Ref Range Status  12/22/2022 16 9 - 46 U/L Final         Passed - AST in normal range and within 360 days    AST  Date Value Ref Range Status  12/22/2022 22 10 - 35 U/L Final         Passed - Last BP in normal range    BP Readings from Last 1 Encounters:  01/26/23 (!) 92/54         Passed - Valid encounter within last 12 months    Recent Outpatient Visits           1 week ago Right-sided chest wall pain   [facility] [place], Salvadore Oxford, NP   1 month ago Annual physical exam   [place] Smitty Cords, DO   3 months ago Orthostatic hypotension   [facility] [place], Netta Neat, DO   4 months ago Acute right-sided low back pain with right-sided sciatica   [place] [facility] [place], Salvadore Oxford, NP   6 months ago Centrilobular emphysema [facility])   [place] [facility] [place], Jackelyn Poling, RPH-CPP       Future Appointments             In 5 months Althea Charon, Netta Neat, DO  [facility]

## 2023-02-05 ENCOUNTER — Emergency Department: Payer: Medicare HMO

## 2023-02-05 ENCOUNTER — Emergency Department
Admission: EM | Admit: 2023-02-05 | Discharge: 2023-02-05 | Disposition: A | Discharge status: Home / Self Care | Payer: Medicare HMO | Attending: Emergency Medicine | Admitting: Emergency Medicine

## 2023-02-05 ENCOUNTER — Other Ambulatory Visit: Payer: Self-pay

## 2023-02-05 ENCOUNTER — Encounter: Payer: Self-pay | Admitting: Emergency Medicine

## 2023-02-05 DIAGNOSIS — C3490 Malignant neoplasm of unspecified part of unspecified bronchus or lung: Secondary | ICD-10-CM | POA: Insufficient documentation

## 2023-02-05 DIAGNOSIS — J439 Emphysema, unspecified: Secondary | ICD-10-CM | POA: Diagnosis not present

## 2023-02-05 DIAGNOSIS — K8020 Calculus of gallbladder without cholecystitis without obstruction: Secondary | ICD-10-CM | POA: Diagnosis not present

## 2023-02-05 DIAGNOSIS — R55 Syncope and collapse: Secondary | ICD-10-CM | POA: Diagnosis not present

## 2023-02-05 DIAGNOSIS — D649 Anemia, unspecified: Secondary | ICD-10-CM | POA: Diagnosis not present

## 2023-02-05 DIAGNOSIS — E860 Dehydration: Secondary | ICD-10-CM | POA: Diagnosis not present

## 2023-02-05 DIAGNOSIS — D638 Anemia in other chronic diseases classified elsewhere: Secondary | ICD-10-CM | POA: Insufficient documentation

## 2023-02-05 DIAGNOSIS — R918 Other nonspecific abnormal finding of lung field: Secondary | ICD-10-CM | POA: Diagnosis not present

## 2023-02-05 DIAGNOSIS — I959 Hypotension, unspecified: Secondary | ICD-10-CM | POA: Diagnosis not present

## 2023-02-05 DIAGNOSIS — I951 Orthostatic hypotension: Secondary | ICD-10-CM | POA: Insufficient documentation

## 2023-02-05 DIAGNOSIS — R4182 Altered mental status, unspecified: Secondary | ICD-10-CM | POA: Diagnosis not present

## 2023-02-05 DIAGNOSIS — R531 Weakness: Secondary | ICD-10-CM | POA: Diagnosis not present

## 2023-02-05 LAB — CBC
HCT: 39.7 % (ref 39.0–52.0)
Hemoglobin: 12.1 g/dL — ABNORMAL LOW (ref 13.0–17.0)
MCH: 28.5 pg (ref 26.0–34.0)
MCHC: 30.5 g/dL (ref 30.0–36.0)
MCV: 93.4 fL (ref 80.0–100.0)
Platelets: 407 10*3/uL — ABNORMAL HIGH (ref 150–400)
RBC: 4.25 MIL/uL (ref 4.22–5.81)
RDW: 15.3 % (ref 11.5–15.5)
WBC: 15 10*3/uL — ABNORMAL HIGH (ref 4.0–10.5)
nRBC: 0 % (ref 0.0–0.2)

## 2023-02-05 LAB — BASIC METABOLIC PANEL
Anion gap: 8 (ref 5–15)
BUN: 14 mg/dL (ref 8–23)
CO2: 22 mmol/L (ref 22–32)
Calcium: 9.3 mg/dL (ref 8.9–10.3)
Chloride: 105 mmol/L (ref 98–111)
Creatinine, Ser: 0.99 mg/dL (ref 0.61–1.24)
GFR, Estimated: >60 mL/min (ref >60)
Glucose, Bld: 119 mg/dL — ABNORMAL HIGH (ref 70–99)
Potassium: 3.9 mmol/L (ref 3.5–5.1)
Sodium: 135 mmol/L (ref 135–145)

## 2023-02-05 LAB — FERRITIN: Ferritin: 765 ng/mL — ABNORMAL HIGH (ref 24–336)

## 2023-02-05 LAB — IRON AND TIBC
Iron: 16 ug/dL — ABNORMAL LOW (ref 45–182)
Saturation Ratios: 7 % — ABNORMAL LOW (ref 17.9–39.5)
TIBC: 248 ug/dL — ABNORMAL LOW (ref 250–450)
UIBC: 232 ug/dL

## 2023-02-05 LAB — URINALYSIS, ROUTINE W REFLEX MICROSCOPIC
Bilirubin Urine: NEGATIVE
Glucose, UA: NEGATIVE mg/dL
Hgb urine dipstick: NEGATIVE
Ketones, ur: NEGATIVE mg/dL
Leukocytes,Ua: NEGATIVE
Nitrite: NEGATIVE
Protein, ur: NEGATIVE mg/dL
Specific Gravity, Urine: 1.034 — ABNORMAL HIGH (ref 1.005–1.030)
pH: 7 (ref 5.0–8.0)

## 2023-02-05 LAB — RETICULOCYTES
Immature Retic Fract: 19.7 % — ABNORMAL HIGH (ref 2.3–15.9)
RBC.: 4.18 MIL/uL — ABNORMAL LOW (ref 4.22–5.81)
Retic Count, Absolute: 66.5 10*3/uL (ref 19.0–186.0)
Retic Ct Pct: 1.6 % (ref 0.4–3.1)

## 2023-02-05 LAB — TSH: TSH: 2.969 u[IU]/mL (ref 0.350–4.500)

## 2023-02-05 LAB — PROCALCITONIN: Procalcitonin: 2.25 ng/mL

## 2023-02-05 LAB — VITAMIN B12: Vitamin B-12: 644 pg/mL (ref 180–914)

## 2023-02-05 LAB — FOLATE: Folate: 20.5 ng/mL (ref >5.9)

## 2023-02-05 LAB — LACTIC ACID, PLASMA: Lactic Acid, Venous: 1.1 mmol/L (ref 0.5–1.9)

## 2023-02-05 LAB — TROPONIN I (HIGH SENSITIVITY): Troponin I (High Sensitivity): 3 ng/L (ref <18)

## 2023-02-05 MED ORDER — SODIUM CHLORIDE 0.9 % IV BOLUS
500.0000 mL | Freq: Once | INTRAVENOUS | Status: AC
  Administered 2023-02-05: 500 mL via INTRAVENOUS

## 2023-02-05 MED ORDER — MIDODRINE HCL 5 MG PO TABS
10.0000 mg | ORAL_TABLET | Freq: Once | ORAL | Status: AC
  Administered 2023-02-05: 10 mg via ORAL
  Filled 2023-02-05: qty 2

## 2023-02-05 MED ORDER — SODIUM CHLORIDE 0.9 % IV BOLUS
1000.0000 mL | Freq: Once | INTRAVENOUS | Status: AC
  Administered 2023-02-05: 1000 mL via INTRAVENOUS

## 2023-02-05 MED ORDER — IOHEXOL 300 MG/ML  SOLN
80.0000 mL | Freq: Once | INTRAMUSCULAR | Status: AC | PRN
  Administered 2023-02-05: 80 mL via INTRAVENOUS

## 2023-02-05 MED ORDER — MIDODRINE HCL 5 MG PO TABS
10.0000 mg | ORAL_TABLET | Freq: Three times a day (TID) | ORAL | 0 refills | Status: AC
Start: 2023-02-05 — End: 2023-02-15

## 2023-02-05 NOTE — ED Provider Notes (Signed)
[facility] Provider Note    Event Date/Time   First MD Initiated Contact with Patient 02/05/23 1044     (approximate)   History   Weakness and Dizziness   HPI  Ethan Fitzgerald is a 67 y.o. male  here with generalized weakness, dizziness, orthostasis. Pt reports that for the past 10 days or so, he has had progressively worsening generalized weakness and lightheadedness, particularly with standing. He has had to take up to 15-20 minutes to stand without getting and staying dizzy and has had low energy, general fatigue. He is on midodrine that was just started in the past 1-2 months by Cardiology. No known trigger for his low BP. He does take blood thinners but denies a known h/o blood in stools or melena. No fevers, chills. No cough. No urinary symptoms.       Physical Exam   Triage Vital Signs: ED Triage Vitals  Enc Vitals Group     BP 02/05/23 1017 119/87     Pulse Rate 02/05/23 1017 (!) 106     Resp 02/05/23 1017 20     Temp 02/05/23 1017 98.4 F (36.9 C)     Temp Source 02/05/23 1017 Oral     SpO2 02/05/23 1017 95 %     Weight 02/05/23 1018 152 lb (68.9 kg)     Height 02/05/23 1018 6' (1.829 m)     Head Circumference --      Peak Flow --      Pain Score 02/05/23 1018 0     Pain Loc --      Pain Edu? --      Excl. in GC? --     Most recent vital signs: Vitals:   02/05/23 1017 02/05/23 1300  BP: 119/87 112/82  Pulse: (!) 106 76  Resp: 20 19  Temp: 98.4 F (36.9 C)   SpO2: 95% 100%     General: Awake, no distress.  CV:  Good peripheral perfusion. Mild tachycardia. No murmurs. Resp:  Normal work of breathing. Lungs clear. Abd:  No distention. No tenderness. No guarding or rebound. Other:  Chronic L facial droop, slight slurred speech which is baseline. Strength 5/5 bl UE and LE.   ED Results / Procedures / Treatments   Labs (all labs ordered are listed, but only abnormal results are displayed) Labs Reviewed  BASIC METABOLIC  PANEL - Abnormal; Notable for the following components:      Result Value   Glucose, Bld 119 (*)    All other components within normal limits  CBC - Abnormal; Notable for the following components:   WBC 15.0 (*)    Hemoglobin 12.1 (*)    Platelets 407 (*)    All other components within normal limits  URINALYSIS, ROUTINE W REFLEX MICROSCOPIC - Abnormal; Notable for the following components:   Color, Urine STRAW (*)    APPearance HAZY (*)    Specific Gravity, Urine 1.034 (*)    All other components within normal limits  IRON AND TIBC - Abnormal; Notable for the following components:   Iron 16 (*)    TIBC 248 (*)    Saturation Ratios 7 (*)    All other components within normal limits  FERRITIN - Abnormal; Notable for the following components:   Ferritin 765 (*)    All other components within normal limits  RETICULOCYTES - Abnormal; Notable for the following components:   RBC. 4.18 (*)    Immature Retic Fract 19.7 (*)  All other components within normal limits  CULTURE, BLOOD (SINGLE)  LACTIC ACID, PLASMA  PROCALCITONIN  TSH  VITAMIN B12  FOLATE  CBG MONITORING, ED  TROPONIN I (HIGH SENSITIVITY)     EKG Sinus tachycardia, VR 114. PR 144, QRS 70, QTc 452. No acute ST elevations or depressions. No ischemia or infarct.   RADIOLOGY CT Chest; 8 cm right upper lobe lung mass, likely bronchogenic carcinoma, nonspecific thickening of R adrenal gland CT Head: old infarcts, no acute abnormality CXR: Concern for R perihilar mass   I also independently reviewed and agree with radiologist interpretations.   PROCEDURES:  Critical Care performed: No  .1-3 Lead EKG Interpretation  Performed by: Shaune Pollack, MD Authorized by: Shaune Pollack, MD     Interpretation: normal     ECG rate:  70-90   ECG rate assessment: normal     Rhythm: sinus rhythm     Ectopy: none     Conduction: normal   Comments:     Indication: Weakness     MEDICATIONS ORDERED IN  ED: Medications  sodium chloride 0.9 % bolus 1,000 mL (0 mLs Intravenous Stopped 02/05/23 1440)  midodrine (PROAMATINE) tablet 10 mg (10 mg Oral Given 02/05/23 1126)  iohexol (OMNIPAQUE) 300 MG/ML solution 80 mL (80 mLs Intravenous Contrast Given 02/05/23 1210)  sodium chloride 0.9 % bolus 500 mL (0 mLs Intravenous Stopped 02/05/23 1442)     IMPRESSION / MDM / ASSESSMENT AND PLAN / ED COURSE  I reviewed the triage vital signs and the nursing notes.                              Differential diagnosis includes, but is not limited to, dehydration, orthostasis, anemia, medication effect, hypothyroidism, PE, CHF,  Patient's presentation is most consistent with acute presentation with potential threat to life or bodily function.  The patient is on the cardiac monitor to evaluate for evidence of arrhythmia and/or significant heart rate changes  67 yo M with PMHx stroke, tobacco use, hypotension, here with near syncope/weakness and hypotension. Re: hypotension, suspect acute on chronic hypotension in setting of relative dehydration as well as mild subacute anemia of chronic disease. He was given an increased dose of his midodrine and fluids with resolution of sx and he is now very well appearing and in NAD. Initial CXR obtained was concerning for lung mass so CT C/A/P was obtained and unfortunately appears consistent with large bronchogenic carcinoma. No evidence of pneumonia. UA is c/w dehydration but no UTI.   Discussed imaging with pt and with Dr. Orlie Dakin. Will have pt f/u in clinic this week. He will hold his aspirin in hope of biopsy this week. Encouraged hydration and increased midodrine until BP stabilizes. Suspect his relative hypotension is 2/2 poor PO intake, dehydration and wasting/malnutrition related to his underlying malignancy.   FINAL CLINICAL IMPRESSION(S) / ED DIAGNOSES   Final diagnoses:  Bronchogenic carcinoma (HCC)  Dehydration  Orthostasis  Anemia of chronic disease     Rx /  DC Orders   ED Discharge Orders          Ordered    midodrine (PROAMATINE) 5 MG tablet  3 times daily with meals        02/05/23 1354             Note:  This document was prepared using Dragon voice recognition software and may include unintentional dictation errors.   Shaune Pollack, MD 02/05/23  2000  

## 2023-02-05 NOTE — ED Triage Notes (Signed)
Pt via POV from home. Pt c/o dizziness, weakness, and hypotension for the past 10 days. States that he was told to come to the ER from Dr. Windell Hummingbird office. Denies fever. Denies pain. Pt has had multiple falls since the dizziness and weakness started. Pt does have L sided facial droop but it was from a previous stroke. Pt c/o bilateral elbow and knee pain. States that he has not had any head injuries. Pt does take ASA at home. Pt is A&OX4 and NAD

## 2023-02-05 NOTE — Discharge Instructions (Addendum)
For your blood pressure:  Drink at least 6-8 glasses of water daily  INCREASE your Midodrine from 5 mg to 10 mg (2 5mg  tablets) with meals three times daily  Dr. Milinda Cave office should call you Monday re: the mass seen on your CT scan

## 2023-02-05 NOTE — ED Notes (Signed)
Lt green, purple, and blue lab tubes sent to lab.

## 2023-02-07 ENCOUNTER — Encounter: Payer: Self-pay | Admitting: *Deleted

## 2023-02-07 DIAGNOSIS — R918 Other nonspecific abnormal finding of lung field: Secondary | ICD-10-CM

## 2023-02-07 LAB — CULTURE, BLOOD (SINGLE)

## 2023-02-07 NOTE — Progress Notes (Signed)
Referral received to help coordinate appts for outpatient follow up for lung mass found during recent ED visit. Per Dr. Orlie Dakin, pt will need PET scan and new patient visit with him. Orders placed for PET and pt will be called with appts once scheduled.

## 2023-02-09 ENCOUNTER — Encounter: Payer: Self-pay | Admitting: Oncology

## 2023-02-09 ENCOUNTER — Other Ambulatory Visit: Payer: Self-pay | Admitting: *Deleted

## 2023-02-09 ENCOUNTER — Inpatient Hospital Stay: Payer: Medicare HMO

## 2023-02-09 ENCOUNTER — Telehealth: Payer: Self-pay

## 2023-02-09 ENCOUNTER — Encounter: Payer: Self-pay | Admitting: *Deleted

## 2023-02-09 ENCOUNTER — Inpatient Hospital Stay: Payer: Medicare HMO | Attending: Oncology | Admitting: Oncology

## 2023-02-09 VITALS — BP 81/61 | HR 95 | Temp 97.4°F | Resp 16 | Ht 72.0 in | Wt 146.0 lb

## 2023-02-09 DIAGNOSIS — R531 Weakness: Secondary | ICD-10-CM | POA: Insufficient documentation

## 2023-02-09 DIAGNOSIS — Z8616 Personal history of COVID-19: Secondary | ICD-10-CM | POA: Diagnosis not present

## 2023-02-09 DIAGNOSIS — D509 Iron deficiency anemia, unspecified: Secondary | ICD-10-CM | POA: Insufficient documentation

## 2023-02-09 DIAGNOSIS — Z7982 Long term (current) use of aspirin: Secondary | ICD-10-CM | POA: Insufficient documentation

## 2023-02-09 DIAGNOSIS — I700 Atherosclerosis of aorta: Secondary | ICD-10-CM | POA: Insufficient documentation

## 2023-02-09 DIAGNOSIS — R079 Chest pain, unspecified: Secondary | ICD-10-CM | POA: Diagnosis not present

## 2023-02-09 DIAGNOSIS — Z87891 Personal history of nicotine dependence: Secondary | ICD-10-CM | POA: Diagnosis not present

## 2023-02-09 DIAGNOSIS — J432 Centrilobular emphysema: Secondary | ICD-10-CM | POA: Diagnosis not present

## 2023-02-09 DIAGNOSIS — Z8673 Personal history of transient ischemic attack (TIA), and cerebral infarction without residual deficits: Secondary | ICD-10-CM | POA: Diagnosis not present

## 2023-02-09 DIAGNOSIS — R634 Abnormal weight loss: Secondary | ICD-10-CM | POA: Insufficient documentation

## 2023-02-09 DIAGNOSIS — D72829 Elevated white blood cell count, unspecified: Secondary | ICD-10-CM | POA: Insufficient documentation

## 2023-02-09 DIAGNOSIS — Z681 Body mass index (BMI) 19 or less, adult: Secondary | ICD-10-CM | POA: Diagnosis not present

## 2023-02-09 DIAGNOSIS — I959 Hypotension, unspecified: Secondary | ICD-10-CM | POA: Diagnosis not present

## 2023-02-09 DIAGNOSIS — R918 Other nonspecific abnormal finding of lung field: Secondary | ICD-10-CM | POA: Diagnosis not present

## 2023-02-09 DIAGNOSIS — Z791 Long term (current) use of non-steroidal anti-inflammatories (NSAID): Secondary | ICD-10-CM | POA: Diagnosis not present

## 2023-02-09 DIAGNOSIS — Z79899 Other long term (current) drug therapy: Secondary | ICD-10-CM | POA: Insufficient documentation

## 2023-02-09 LAB — CULTURE, BLOOD (SINGLE): Culture: NO GROWTH

## 2023-02-09 MED ORDER — HYDROCODONE-ACETAMINOPHEN 5-325 MG PO TABS: 1.0000 | ORAL_TABLET | Freq: Four times a day (QID) | ORAL | 0 refills | Status: DC | PRN

## 2023-02-09 NOTE — Progress Notes (Signed)
Met with patient during initial consult with Dr. Orlie Dakin. All questions answered during visit. Reviewed upcoming appts. Contact info given and instructed to call with any questions or needs. Pt verbalized understanding.

## 2023-02-09 NOTE — Telephone Encounter (Signed)
Transition Care Management Follow-up Telephone Call Date of discharge and from where: [facility] 6/1 How have you been since you were released from the hospital? Not good he found out he has cancer Any questions or concerns? No  Items Reviewed: Did the pt receive and understand the discharge instructions provided? Yes  Medications obtained and verified? Yes  Other? No  Any new allergies since your discharge? No  Dietary orders reviewed? No Do you have support at home? Yes     Follow up appointments reviewed:  PCP Hospital f/u appt confirmed? No  Scheduled to see  on  @ . Specialist Hospital f/u appt confirmed? Yes  Scheduled to see  on  @ . Are transportation arrangements needed? No  If their condition worsens, is the pt aware to call PCP or go to the Emergency Dept.? Yes Was the patient provided with contact information for the PCP's office or ED? Yes Was to pt encouraged to call back with questions or concerns? Yes

## 2023-02-09 NOTE — Progress Notes (Signed)
[facility]  [phone]) [phone] [phone]) [phone]  ID: Ethan Fitzgerald OB: [birth year]  MR#: [id number]  AOZ#:[id number]  Patient Care Team: Smitty Cords, DO as PCP - General (Family Medicine) Micki Riley, MD as Referring Physician (Neurology) Glory Buff, RN as Oncology Nurse Navigator  CHIEF COMPLAINT: Right upper lobe lung mass  INTERVAL HISTORY: Patient is a 67 year old male who recently presented to the emergency room with increasing weakness and fatigue and low blood pressure.  He was noted to have an 8 cm right upper lobe lung mass on chest CT.  Patient also admits to approximately 30 pound weight loss over the last 4 to 5 months.  He is anxious, but otherwise feels well.  He has no neurologic complaints.  He denies any recent fevers or illnesses.  He has a fair appetite.  He has chest pain, but denies any shortness of breath, cough, or hemoptysis.  He has no nausea, vomiting, constipation, or diarrhea.  He has no urinary complaints.  Patient offers no further specific complaints today.  REVIEW OF SYSTEMS:   Review of Systems  Constitutional:  Positive for weight loss. Negative for fever and malaise/fatigue.  Respiratory: Negative.  Negative for cough, hemoptysis and shortness of breath.   Cardiovascular:  Positive for chest pain. Negative for leg swelling.  Gastrointestinal: Negative.  Negative for abdominal pain.  Genitourinary: Negative.  Negative for dysuria.  Musculoskeletal: Negative.  Negative for back pain.  Skin: Negative.  Negative for rash.  Neurological: Negative.  Negative for dizziness, focal weakness, weakness and headaches.  Psychiatric/Behavioral:  The patient is nervous/anxious.     As per HPI. Otherwise, a complete review of systems is negative.  PAST MEDICAL HISTORY: Past Medical History:  Diagnosis Date   COPD (chronic obstructive pulmonary disease) (HCC)    mild   COVID-19 09/2019   Erectile dysfunction     Headache    daily, stress headaches   Knee pain, left    Seasonal allergies    Stroke (HCC)    Tobacco dependence     PAST SURGICAL HISTORY: Past Surgical History:  Procedure Laterality Date   BACK SURGERY     metal plate in back   COLONOSCOPY     COLONOSCOPY WITH PROPOFOL N/A 04/26/2019   Procedure: COLONOSCOPY WITH PROPOFOL;  Surgeon: Wyline Mood, MD;  Location: [facility];  Service: Gastroenterology;  Laterality: N/A;   HERNIA REPAIR Right    IR ANGIO INTRA EXTRACRAN SEL COM CAROTID INNOMINATE UNI L MOD SED  03/27/2020   IR ANGIO VERTEBRAL SEL SUBCLAVIAN INNOMINATE UNI L MOD SED  03/27/2020   IR ANGIO VERTEBRAL SEL VERTEBRAL UNI R MOD SED  03/27/2020   IR CT HEAD LTD  03/27/2020   IR PERCUTANEOUS ART THROMBECTOMY/INFUSION INTRACRANIAL INC DIAG ANGIO  03/27/2020   KNEE ARTHROSCOPY Left 03/28/2015   Procedure: Arthroscopic partial medial meniscectomy plus chondral debridement;  Surgeon: Erin Sons, MD;  Location: [facility];  Service: Orthopedics;  Laterality: Left;   RADIOLOGY WITH ANESTHESIA N/A 03/27/2020   Procedure: IR WITH ANESTHESIA;  Surgeon: Julieanne Cotton, MD;  Location: [facility] OR;  Service: Radiology;  Laterality: N/A;    FAMILY HISTORY: Family History  Problem Relation Age of Onset   Diabetes Mother    Heart attack Mother 42   Cancer Father        liver    COPD Brother    HIV/AIDS Brother    Prostate cancer Neg Hx    Colon cancer Neg Hx  ADVANCED DIRECTIVES (Y/N):  N  HEALTH MAINTENANCE: Social History   Tobacco Use   Smoking status: Former    Packs/day: 1.50    Years: 49.00    Additional pack years: 0.00    Total pack years: 73.50    Types: Cigarettes    Start date: 35    Quit date: 09/14/2017    Years since quitting: 5.4   Smokeless tobacco: Former  Building services engineer Use: Never used  Substance Use Topics   Alcohol use: Yes    Comment: 6 pack on weekend   Drug use: No     Colonoscopy:  PAP:  Bone density:  Lipid  panel:  No Known Allergies  Current Outpatient Medications  Medication Sig Dispense Refill   albuterol (VENTOLIN HFA) 108 (90 Base) MCG/ACT inhaler INHALE 1 TO 2 PUFFS EVERY 4 HOURS AS NEEDED FOR WHEEZING OR SHORTNESS OF BREATH 1 each 2   cholecalciferol (VITAMIN D3) 25 MCG (1000 UNIT) tablet Take 1,000 Units by mouth daily.     fluticasone (FLONASE) 50 MCG/ACT nasal spray Place 2 sprays into both nostrils daily. 48 g 3   HYDROcodone-acetaminophen (NORCO/VICODIN) 5-325 MG tablet Take 1 tablet by mouth every 6 (six) hours as needed for moderate pain. 30 tablet 0   loratadine (CLARITIN) 10 MG tablet Take 1 tablet (10 mg total) by mouth daily. Use for 4-6 weeks then stop, and use as needed or seasonally 30 tablet 11   midodrine (PROAMATINE) 5 MG tablet Take 2 tablets (10 mg total) by mouth 3 (three) times daily with meals for 10 days. TAKE 1 TABLET BY MOUTH 3 TIMES DAILY WITH MEALS 60 tablet 0   naproxen (NAPROSYN) 500 MG tablet Take 1 tablet (500 mg total) by mouth 2 (two) times daily with a meal. 10 tablet 0   OVER THE COUNTER MEDICATION Equate allergy med     rosuvastatin (CRESTOR) 40 MG tablet Take 1 tablet (40 mg total) by mouth every other day. 45 tablet 3   sildenafil (REVATIO) 20 MG tablet TAKE 1 TO 5 TABLETS BY MOUTH ABOUT 30 MINUTES PRIOR TO SEX.START WITH 1 THEN INCREASE 30 tablet 5   STIOLTO RESPIMAT 2.5-2.5 MCG/ACT AERS INHALE 2 PUFFS INTO THE LUNGS DAILY. 12 g 3   vitamin B-12 (CYANOCOBALAMIN) 1000 MCG tablet Take 1 tablet (1,000 mcg total) by mouth daily.     vitamin C (ASCORBIC ACID) 500 MG tablet Take 500 mg by mouth daily. Am     aspirin EC 325 MG EC tablet Take 1 tablet (325 mg total) by mouth daily. (Patient not taking: Reported on 02/09/2023) 90 tablet 0   cyclobenzaprine (FLEXERIL) 10 MG tablet Take 1 tablet (10 mg total) by mouth 3 (three) times daily as needed for muscle spasms. (Patient not taking: Reported on 02/09/2023) 30 tablet 0   fludrocortisone (FLORINEF) 0.1 MG tablet  Take 0.1 mg by mouth daily.     methocarbamol (ROBAXIN) 500 MG tablet Take 1 tablet (500 mg total) by mouth 4 (four) times daily. (Patient not taking: Reported on 02/09/2023) 20 tablet 0   traMADol (ULTRAM) 50 MG tablet Take 50 mg by mouth every 8 (eight) hours. (Patient not taking: Reported on 02/09/2023)     No current facility-administered medications for this visit.    OBJECTIVE: Vitals:   02/09/23 1130  BP: (!) 81/61  Pulse: 95  Resp: 16  Temp: (!) 97.4 F (36.3 C)  SpO2: 100%     Body mass index is 19.8 kg/m.  ECOG FS:1 - Symptomatic but completely ambulatory  General: Well-developed, well-nourished, no acute distress. Eyes: Pink conjunctiva, anicteric sclera. HEENT: Normocephalic, moist mucous membranes. Lungs: No audible wheezing or coughing. Heart: Regular rate and rhythm. Abdomen: Soft, nontender, no obvious distention. Musculoskeletal: No edema, cyanosis, or clubbing. Neuro: Alert, answering all questions appropriately. Cranial nerves grossly intact. Skin: No rashes or petechiae noted. Psych: Normal affect. Lymphatics: No cervical, calvicular, axillary or inguinal LAD.   LAB RESULTS:  Lab Results  Component Value Date   NA 135 02/05/2023   K 3.9 02/05/2023   CL 105 02/05/2023   CO2 22 02/05/2023   GLUCOSE 119 (H) 02/05/2023   BUN 14 02/05/2023   CREATININE 0.99 02/05/2023   CALCIUM 9.3 02/05/2023   PROT 6.4 12/22/2022   ALBUMIN 3.0 (L) 04/02/2020   AST 22 12/22/2022   ALT 16 12/22/2022   ALKPHOS 68 04/02/2020   BILITOT 0.5 12/22/2022   GFRNONAA >60 02/05/2023   GFRAA 85 12/22/2020    Lab Results  Component Value Date   WBC 15.0 (H) 02/05/2023   NEUTROABS 10,207 (H) 12/22/2022   HGB 12.1 (L) 02/05/2023   HCT 39.7 02/05/2023   MCV 93.4 02/05/2023   PLT 407 (H) 02/05/2023     STUDIES: CT CHEST ABDOMEN PELVIS W CONTRAST  Result Date: 02/05/2023 CLINICAL DATA:  Hypotension, weakness, and dizziness. Right lung mass on chest radiograph. * Tracking  Code: BO * EXAM: CT CHEST, ABDOMEN, AND PELVIS WITH CONTRAST TECHNIQUE: Multidetector CT imaging of the chest, abdomen and pelvis was performed following the standard protocol during bolus administration of intravenous contrast. RADIATION DOSE REDUCTION: This exam was performed according to the departmental dose-optimization program which includes automated exposure control, adjustment of the mA and/or kV according to patient size and/or use of iterative reconstruction technique. CONTRAST:  80mL OMNIPAQUE IOHEXOL 300 MG/ML  SOLN COMPARISON:  12/19/2019 and 09/23/2010 FINDINGS: CT CHEST FINDINGS Cardiovascular: No acute findings. Aortic atherosclerotic calcification incidentally noted. Mediastinum/Lymph Nodes: No masses or pathologically enlarged lymph nodes identified. Lungs/Pleura: Large solid mass is centered in the posterior right upper lobe and appears to cross the major fissure involve the superior segment of the right lower lobe. This measures 7.7 x 0.6 cm, consistent with primary bronchogenic carcinoma. This mass shows probable mediastinal involvement in the right paraspinal region. No evidence of pleural effusion. Mild bilateral lower lung atelectasis or scarring noted. Moderate centrilobular emphysema. Musculoskeletal:  No suspicious bone lesions identified. CT ABDOMEN AND PELVIS FINDINGS Hepatobiliary: No masses identified. Tiny cholesterol gallstones, without evidence of cholecystitis or biliary ductal dilatation. Pancreas:  No mass or inflammatory changes. Spleen:  Within normal limits in size and appearance. Adrenals/Urinary tract: Nodular thickening of right adrenal gland is seen measuring 1.1 cm. Differential diagnosis includes adrenal metastasis, adenoma, or nodular hyperplasia. No suspicious masses or hydronephrosis. Stomach/Bowel: No evidence of obstruction, inflammatory process, or abnormal fluid collections. Vascular/Lymphatic: No pathologically enlarged lymph nodes identified. No acute vascular  findings. Aortic atherosclerotic calcification incidentally noted. Reproductive:  No mass or other significant abnormality identified. Other:  None. Musculoskeletal:  No suspicious bone lesions identified. IMPRESSION: 8 cm right upper lobe lung mass, which appears to involve the major fissure and superior segment of the right lower lobe. Probable mediastinal involvement also noted in the right paraspinal region. This is consistent with primary bronchogenic carcinoma. Nonspecific nodular thickening of right adrenal gland. Differential diagnosis includes adrenal metastasis, adenoma, or nodular hyperplasia. Consider further evaluation with PET-CT. No thoracic lymphadenopathy or other sites of metastatic disease identified. Cholelithiasis.  No radiographic evidence of cholecystitis. Aortic Atherosclerosis (ICD10-I70.0) and Emphysema (ICD10-J43.9). Electronically Signed   By: Danae Orleans M.D.   On: 02/05/2023 12:41   CT HEAD WO CONTRAST ( )  Result Date: 02/05/2023 CLINICAL DATA:  67 year old male with altered mental status, weakness, near syncope. History of chronic bilateral ICA occlusion. EXAM: CT HEAD WITHOUT CONTRAST TECHNIQUE: Contiguous axial images were obtained from the base of the skull through the vertex without intravenous contrast. RADIATION DOSE REDUCTION: This exam was performed according to the departmental dose-optimization program which includes automated exposure control, adjustment of the mA and/or kV according to patient size and/or use of iterative reconstruction technique. COMPARISON:  CTA head and neck 08/18/2021. FINDINGS: Brain: Chronic encephalomalacia right MCA territory middle/anterior division. Mild associated chronic ex vacuo enlargement of the right lateral ventricle. No superimposed midline shift, ventriculomegaly, mass effect, evidence of mass lesion, intracranial hemorrhage or evidence of cortically based acute infarction. Stable gray-white matter differentiation throughout the  brain since 2022. Vascular: No suspicious intracranial vascular hyperdensity. Calcified atherosclerosis at the skull base. Skull: No acute osseous abnormality identified. Sinuses/Orbits: Visualized paranasal sinuses and mastoids are stable and well aerated. Other: Stable, negative orbit and scalp soft tissues. IMPRESSION: 1. No acute intracranial abnormality. 2. Stable chronic right MCA territory infarct, in this patient with a history of chronic bilateral ICA occlusion. Electronically Signed   By: Odessa Fleming M.D.   On: 02/05/2023 11:50   DG Chest Portable 1 View  Result Date: 02/05/2023 CLINICAL DATA:  Weakness and hypotension for the past 10 days. EXAM: PORTABLE CHEST 1 VIEW COMPARISON:  Chest radiograph, 12/04/2019. FINDINGS: 6 cm right hilar mass. No mediastinal or left hilar masses. Cardiac silhouette is normal in size. Lungs are hyperexpanded, but clear. No pleural effusion or pneumothorax. Skeletal structures are grossly intact. IMPRESSION: 1. 6 cm right hilar mass suspicious for malignancy. Follow up with chest and abdomen CT with contrast is recommended for further assessment/staging. 2. No acute cardiopulmonary disease. Electronically Signed   By: Amie Portland M.D.   On: 02/05/2023 11:40    ASSESSMENT: Right upper lobe lung mass.  PLAN:    Right upper lobe lung mass: Highly suspicious for underlying malignancy.  Will get a PET scan and MRI to complete the staging workup.  A referral has also been sent to pulmonology for consideration of bronchoscopy/EBUS and biopsy.  Patient will then return to clinic 3 to 4 days after his biopsy to discuss the results and treatment planning. Chest pain: Possibly secondary to invasion of the mass into the mediastinum.  Patient was given a prescription for Vicodin today. Iron deficiency anemia: Patient's hemoglobin is only mildly decreased at 12.1, but he has noted to have reduced iron stores.  Can consider IV iron in the near future. Leukocytosis: Likely  reactive, monitor. Weight loss: Will consider dietary referral in the near future.  I spent a total of 60 minutes reviewing chart data, face-to-face evaluation with the patient, counseling and coordination of care as detailed above.   Patient expressed understanding and was in agreement with this plan. He also understands that He can call clinic at any time with any questions, concerns, or complaints.    Cancer Staging  No matching staging information was found for the patient.  Jeralyn Ruths, MD   02/09/2023 3:45 PM

## 2023-02-10 LAB — CULTURE, BLOOD (SINGLE): Special Requests: ADEQUATE

## 2023-02-11 ENCOUNTER — Ambulatory Visit
Admission: RE | Admit: 2023-02-11 | Discharge: 2023-02-11 | Disposition: A | Discharge status: Home / Self Care | Payer: Medicare HMO | Source: Ambulatory Visit | Attending: Oncology | Admitting: Oncology

## 2023-02-11 DIAGNOSIS — R918 Other nonspecific abnormal finding of lung field: Secondary | ICD-10-CM | POA: Insufficient documentation

## 2023-02-11 DIAGNOSIS — C3490 Malignant neoplasm of unspecified part of unspecified bronchus or lung: Secondary | ICD-10-CM | POA: Diagnosis not present

## 2023-02-11 MED ORDER — GADOBUTROL 1 MMOL/ML IV SOLN
6.0000 mL | Freq: Once | INTRAVENOUS | Status: AC | PRN
  Administered 2023-02-11: 7.5 mL via INTRAVENOUS

## 2023-02-14 ENCOUNTER — Ambulatory Visit
Admission: RE | Admit: 2023-02-14 | Discharge: 2023-02-14 | Disposition: A | Discharge status: Home / Self Care | Payer: Medicare HMO | Source: Ambulatory Visit | Attending: Oncology | Admitting: Oncology

## 2023-02-14 DIAGNOSIS — R918 Other nonspecific abnormal finding of lung field: Secondary | ICD-10-CM | POA: Diagnosis not present

## 2023-02-14 LAB — GLUCOSE, CAPILLARY: Glucose-Capillary: 76 mg/dL (ref 70–99)

## 2023-02-14 MED ORDER — FLUDEOXYGLUCOSE F - 18 (FDG) INJECTION
7.6000 | Freq: Once | INTRAVENOUS | Status: AC | PRN
  Administered 2023-02-14: 8.23 via INTRAVENOUS

## 2023-02-17 ENCOUNTER — Ambulatory Visit: Payer: Medicare HMO | Admitting: Student in an Organized Health Care Education/Training Program

## 2023-02-17 ENCOUNTER — Encounter: Payer: Self-pay | Admitting: Student in an Organized Health Care Education/Training Program

## 2023-02-17 ENCOUNTER — Other Ambulatory Visit: Payer: Self-pay | Admitting: Student in an Organized Health Care Education/Training Program

## 2023-02-17 VITALS — BP 110/70 | HR 78 | Temp 97.1°F | Ht 72.0 in | Wt 143.8 lb

## 2023-02-17 DIAGNOSIS — J432 Centrilobular emphysema: Secondary | ICD-10-CM | POA: Diagnosis not present

## 2023-02-17 DIAGNOSIS — R918 Other nonspecific abnormal finding of lung field: Secondary | ICD-10-CM | POA: Insufficient documentation

## 2023-02-17 NOTE — Progress Notes (Signed)
Synopsis: Referred in for lung mass by Saralyn Pilar *  Assessment & Plan:   1. Centrilobular emphysema (HCC) 2. Mass of upper lobe of right lung  Nodule Location: RUL Nodule Size: 8 cm Nodule Spiculation: No Associated Lymphadenopathy: N/A Smoking Status (former) and pack years 100 Extrathoracic cancer > 5 years prior (no) ECOG: 0  The patient is here to discuss their imaging abnormalities which include a RUL mass that appears PET avid on my review of the PET/CT. This is highly suspicious for malignancy and patient is referred for consideration of biopsy for tissue acquisition.  We discussed the importance of diagnosis and staging in lung malignancies, and the approach to obtaining a tissue diagnosis which would include robotic assisted navigational bronchoscopy with endobronchial ultrasound guided sampling.  We also discussed the risks associated with the procedure which include a 2% risk of pneumothorax, infection, bleeding, and nondiagnostic procedure in detail.  I explained that patients typically are able to return home the same day of the procedure, but in rare cases admission to the hospital for observation and treatment is required.  After our discussion, the patient elected to proceed with the procedure  Recommendations: Robotic Assisted navigational bronchoscopy Endobronchial Ultrasound with TBNA    Return if symptoms worsen or fail to improve.  I spent 45 minutes caring for this patient today, including preparing to see the patient, obtaining a medical history , reviewing a separately obtained history, performing a medically appropriate examination and/or evaluation, counseling and educating the patient/family/caregiver, ordering medications, tests, or procedures, documenting clinical information in the electronic health record, and independently interpreting results (not separately reported/billed) and communicating results to the  patient/family/caregiver  Raechel Chute, MD [facility] 02/17/2023 4:23 PM    End of visit medications:  No orders of the defined types were placed in this encounter.    Current Outpatient Medications:    albuterol (VENTOLIN HFA) 108 (90 Base) MCG/ACT inhaler, INHALE 1 TO 2 PUFFS EVERY 4 HOURS AS NEEDED FOR WHEEZING OR SHORTNESS OF BREATH, Disp: 1 each, Rfl: 2   cholecalciferol (VITAMIN D3) 25 MCG (1000 UNIT) tablet, Take 1,000 Units by mouth daily., Disp: , Rfl:    cyclobenzaprine (FLEXERIL) 10 MG tablet, Take 1 tablet (10 mg total) by mouth 3 (three) times daily as needed for muscle spasms., Disp: 30 tablet, Rfl: 0   fludrocortisone (FLORINEF) 0.1 MG tablet, Take 0.1 mg by mouth daily., Disp: , Rfl:    fluticasone (FLONASE) 50 MCG/ACT nasal spray, Place 2 sprays into both nostrils daily., Disp: 48 g, Rfl: 3   HYDROcodone-acetaminophen (NORCO/VICODIN) 5-325 MG tablet, Take 1 tablet by mouth every 6 (six) hours as needed for moderate pain., Disp: 30 tablet, Rfl: 0   loratadine (CLARITIN) 10 MG tablet, Take 1 tablet (10 mg total) by mouth daily. Use for 4-6 weeks then stop, and use as needed or seasonally, Disp: 30 tablet, Rfl: 11   methocarbamol (ROBAXIN) 500 MG tablet, Take 1 tablet (500 mg total) by mouth 4 (four) times daily., Disp: 20 tablet, Rfl: 0   naproxen (NAPROSYN) 500 MG tablet, Take 1 tablet (500 mg total) by mouth 2 (two) times daily with a meal., Disp: 10 tablet, Rfl: 0   OVER THE COUNTER MEDICATION, Equate allergy med, Disp: , Rfl:    rosuvastatin (CRESTOR) 40 MG tablet, Take 1 tablet (40 mg total) by mouth every other day., Disp: 45 tablet, Rfl: 3   sildenafil (REVATIO) 20 MG tablet, TAKE 1 TO 5 TABLETS BY MOUTH  ABOUT 30 MINUTES PRIOR TO SEX.START WITH 1 THEN INCREASE, Disp: 30 tablet, Rfl: 5   STIOLTO RESPIMAT 2.5-2.5 MCG/ACT AERS, INHALE 2 PUFFS INTO THE LUNGS DAILY., Disp: 12 g, Rfl: 3   traMADol (ULTRAM) 50 MG tablet, Take 50 mg by mouth every 8  (eight) hours., Disp: , Rfl:    vitamin B-12 (CYANOCOBALAMIN) 1000 MCG tablet, Take 1 tablet (1,000 mcg total) by mouth daily., Disp: , Rfl:    vitamin C (ASCORBIC ACID) 500 MG tablet, Take 500 mg by mouth daily. Am, Disp: , Rfl:    aspirin EC 325 MG EC tablet, Take 1 tablet (325 mg total) by mouth daily. (Patient not taking: Reported on 02/09/2023), Disp: 90 tablet, Rfl: 0   Subjective:   [name] GENDER: male DOB: [birth year], MRN: [id number]  Chief Complaint  Patient presents with   Consult    Lung nodule. DOE. No wheezing or cough.     HPI  Patient is a pleasant 67 year old male presenting to clinic for the evaluation of a right upper lobe mass.  Patient presented to the ED on 02/05/2023 with chest tightness, weakness, and fatigue. Imaging was notable for a RUL hilar mass confirmed with CT of the chest showing an 8 cm RUL mass concerning for malignancy. Patient was referred to oncology, underwent PET/CT and MRI brain (results pending) and is referred to pulmonary for biopsy.  Patient reports chest discomfort of one month in duration. He doesn't endorse any shortness of breath, chest tightness, or wheezing. He has an occasional cough. He is in his usual state of health and denies any fevers, chills, night sweats, or weight loss. He denies hemoptysis.  Patient has a long standing history of smoking, quit in 2019. He smoked between 1 and 2 packs a day for around 50 years, with between 75 and 100 pack year smoking history. He is originally from [place]. Past medical history is notable for a stroke, with residual left sided weakness.  Ancillary information including prior medications, full medical/surgical/family/social histories, and PFTs (when available) are listed below and have been reviewed.   Review of Systems  Constitutional:  Positive for malaise/fatigue. Negative for chills, fever and weight loss.  Respiratory:  Negative for cough, hemoptysis, sputum production,  shortness of breath and wheezing.   Cardiovascular:  Positive for chest pain. Negative for palpitations and leg swelling.     Objective:   Vitals:   02/17/23 0931  BP: 110/70  Pulse: 78  Temp: (!) 97.1 F (36.2 C)  SpO2: 96%  Weight: 143 lb 12.8 oz (65.2 kg)  Height: 6' (1.829 m)   96% on RA BMI Readings from Last 3 Encounters:  02/17/23 19.50 kg/m  02/09/23 19.80 kg/m  02/05/23 20.61 kg/m   Wt Readings from Last 3 Encounters:  02/17/23 143 lb 12.8 oz (65.2 kg)  02/09/23 146 lb (66.2 kg)  02/05/23 152 lb (68.9 kg)    Physical Exam Constitutional:      Appearance: Normal appearance. He is not ill-appearing.  HENT:     Mouth/Throat:     Mouth: Mucous membranes are moist.  Cardiovascular:     Rate and Rhythm: Normal rate and regular rhythm.     Pulses: Normal pulses.     Heart sounds: Normal heart sounds.  Pulmonary:     Effort: Pulmonary effort is normal.     Breath sounds: Normal breath sounds.  Abdominal:     Palpations: Abdomen is soft.  Neurological:  Mental Status: He is alert. Mental status is at baseline.       Ancillary Information    Past Medical History:  Diagnosis Date   COPD (chronic obstructive pulmonary disease) (HCC)    mild   COVID-19 09/2019   Erectile dysfunction    Headache    daily, stress headaches   Knee pain, left    Seasonal allergies    Stroke (HCC)    Tobacco dependence      Family History  Problem Relation Age of Onset   Diabetes Mother    Heart attack Mother 65   Cancer Father        liver    COPD Brother    HIV/AIDS Brother    Prostate cancer Neg Hx    Colon cancer Neg Hx      Past Surgical History:  Procedure Laterality Date   BACK SURGERY     metal plate in back   COLONOSCOPY     COLONOSCOPY WITH PROPOFOL N/A 04/26/2019   Procedure: COLONOSCOPY WITH PROPOFOL;  Surgeon: Wyline Mood, MD;  Location: [facility];  Service: Gastroenterology;  Laterality: N/A;   HERNIA REPAIR Right    IR ANGIO INTRA  EXTRACRAN SEL COM CAROTID INNOMINATE UNI L MOD SED  03/27/2020   IR ANGIO VERTEBRAL SEL SUBCLAVIAN INNOMINATE UNI L MOD SED  03/27/2020   IR ANGIO VERTEBRAL SEL VERTEBRAL UNI R MOD SED  03/27/2020   IR CT HEAD LTD  03/27/2020   IR PERCUTANEOUS ART THROMBECTOMY/INFUSION INTRACRANIAL INC DIAG ANGIO  03/27/2020   KNEE ARTHROSCOPY Left 03/28/2015   Procedure: Arthroscopic partial medial meniscectomy plus chondral debridement;  Surgeon: Erin Sons, MD;  Location: [facility];  Service: Orthopedics;  Laterality: Left;   RADIOLOGY WITH ANESTHESIA N/A 03/27/2020   Procedure: IR WITH ANESTHESIA;  Surgeon: Julieanne Cotton, MD;  Location: [facility] OR;  Service: Radiology;  Laterality: N/A;    Social History   Socioeconomic History   Marital status: Married    Spouse name: Aurea Graff   Number of children: 0   Years of education: Not on file   Highest education level: Some college, no degree  Occupational History   Not on file  Tobacco Use   Smoking status: Former    Packs/day: 1.50    Years: 49.00    Additional pack years: 0.00    Total pack years: 73.50    Types: Cigarettes    Start date: 58    Quit date: 09/14/2017    Years since quitting: 5.4   Smokeless tobacco: Former  Building services engineer Use: Never used  Substance and Sexual Activity   Alcohol use: Yes    Comment: 6 pack on weekend   Drug use: No   Sexual activity: Not on file  Other Topics Concern   Not on file  Social History Narrative   Lives at home w wife   L handed   Caffeine: 1 C of coffee AM   Social Determinants of Health   Financial Resource Strain: Low Risk  (02/09/2023)   Overall Financial Resource Strain (CARDIA)    Difficulty of Paying Living Expenses: Not hard at all  Food Insecurity: No Food Insecurity (02/09/2023)   Hunger Vital Sign    Worried About Running Out of Food in the Last Year: Never true    Ran Out of Food in the Last Year: Never true  Transportation Needs: No Transportation Needs (02/09/2023)    PRAPARE - Transportation    Lack of Transportation (  Medical): No    Lack of Transportation (Non-Medical): No  Physical Activity: Insufficiently Active (11/05/2022)   Exercise Vital Sign    Days of Exercise per Week: 3 days    Minutes of Exercise per Session: 20 min  Stress: No Stress Concern Present (11/05/2022)   [employer] of Occupational Health - Occupational Stress Questionnaire    Feeling of Stress : Not at all  Social Connections: Socially Isolated (11/05/2022)   Social Connection and Isolation Panel [NHANES]    Frequency of Communication with Friends and Family: Once a week    Frequency of Social Gatherings with Friends and Family: Never    Attends Religious Services: Never    Database administrator or Organizations: No    Attends Banker Meetings: Never    Marital Status: Married  Catering manager Violence: Not At Risk (02/09/2023)   Humiliation, Afraid, Rape, and Kick questionnaire    Fear of Current or Ex-Partner: No    Emotionally Abused: No    Physically Abused: No    Sexually Abused: No     No Known Allergies   CBC    Component Value Date/Time   WBC 15.0 (H) 02/05/2023 1021   RBC 4.18 (L) 02/05/2023 1133   RBC 4.25 02/05/2023 1021   HGB 12.1 (L) 02/05/2023 1021   HCT 39.7 02/05/2023 1021   PLT 407 (H) 02/05/2023 1021   MCV 93.4 02/05/2023 1021   MCH 28.5 02/05/2023 1021   MCHC 30.5 02/05/2023 1021   RDW 15.3 02/05/2023 1021   LYMPHSABS 2,302 12/22/2022 0808   MONOABS 0.7 04/02/2020 0548   EOSABS 301 12/22/2022 0808   BASOSABS 69 12/22/2022 0808    Pulmonary Functions Testing Results:    Latest Ref Rng & Units 12/21/2019    8:39 AM  PFT Results  FVC-Pre L 4.96   FVC-Predicted Pre % 96   FVC-Post L 5.19   FVC-Predicted Post % 101   Pre FEV1/FVC % % 59   Post FEV1/FCV % % 57   FEV1-Pre L 2.92   FEV1-Predicted Pre % 76   FEV1-Post L 2.97   DLCO uncorrected ml/min/mmHg 24.81   DLCO UNC% % 84   DLCO corrected ml/min/mmHg 24.81   DLCO  COR %Predicted % 84   DLVA Predicted % 74     Outpatient Medications Prior to Visit  Medication Sig Dispense Refill   albuterol (VENTOLIN HFA) 108 (90 Base) MCG/ACT inhaler INHALE 1 TO 2 PUFFS EVERY 4 HOURS AS NEEDED FOR WHEEZING OR SHORTNESS OF BREATH 1 each 2   cholecalciferol (VITAMIN D3) 25 MCG (1000 UNIT) tablet Take 1,000 Units by mouth daily.     cyclobenzaprine (FLEXERIL) 10 MG tablet Take 1 tablet (10 mg total) by mouth 3 (three) times daily as needed for muscle spasms. 30 tablet 0   fludrocortisone (FLORINEF) 0.1 MG tablet Take 0.1 mg by mouth daily.     fluticasone (FLONASE) 50 MCG/ACT nasal spray Place 2 sprays into both nostrils daily. 48 g 3   HYDROcodone-acetaminophen (NORCO/VICODIN) 5-325 MG tablet Take 1 tablet by mouth every 6 (six) hours as needed for moderate pain. 30 tablet 0   loratadine (CLARITIN) 10 MG tablet Take 1 tablet (10 mg total) by mouth daily. Use for 4-6 weeks then stop, and use as needed or seasonally 30 tablet 11   methocarbamol (ROBAXIN) 500 MG tablet Take 1 tablet (500 mg total) by mouth 4 (four) times daily. 20 tablet 0   naproxen (NAPROSYN) 500 MG tablet Take  1 tablet (500 mg total) by mouth 2 (two) times daily with a meal. 10 tablet 0   OVER THE COUNTER MEDICATION Equate allergy med     rosuvastatin (CRESTOR) 40 MG tablet Take 1 tablet (40 mg total) by mouth every other day. 45 tablet 3   sildenafil (REVATIO) 20 MG tablet TAKE 1 TO 5 TABLETS BY MOUTH ABOUT 30 MINUTES PRIOR TO SEX.START WITH 1 THEN INCREASE 30 tablet 5   STIOLTO RESPIMAT 2.5-2.5 MCG/ACT AERS INHALE 2 PUFFS INTO THE LUNGS DAILY. 12 g 3   traMADol (ULTRAM) 50 MG tablet Take 50 mg by mouth every 8 (eight) hours.     vitamin B-12 (CYANOCOBALAMIN) 1000 MCG tablet Take 1 tablet (1,000 mcg total) by mouth daily.     vitamin C (ASCORBIC ACID) 500 MG tablet Take 500 mg by mouth daily. Am     aspirin EC 325 MG EC tablet Take 1 tablet (325 mg total) by mouth daily. (Patient not taking: Reported on  02/09/2023) 90 tablet 0   No facility-administered medications prior to visit.

## 2023-02-17 NOTE — H&P (View-Only) (Signed)
 Synopsis: Referred in for lung mass by Karamalegos, Alexander *  Assessment & Plan:   1. Centrilobular emphysema (HCC) 2. Mass of upper lobe of right lung  Nodule Location: RUL Nodule Size: 8 cm Nodule Spiculation: No Associated Lymphadenopathy: N/A Smoking Status (former) and pack years 100 Extrathoracic cancer > 5 years prior (no) ECOG: 0  The patient is here to discuss their imaging abnormalities which include a RUL mass that appears PET avid on my review of the PET/CT. This is highly suspicious for malignancy and patient is referred for consideration of biopsy for tissue acquisition.  We discussed the importance of diagnosis and staging in lung malignancies, and the approach to obtaining a tissue diagnosis which would include robotic assisted navigational bronchoscopy with endobronchial ultrasound guided sampling.  We also discussed the risks associated with the procedure which include a 2% risk of pneumothorax, infection, bleeding, and nondiagnostic procedure in detail.  I explained that patients typically are able to return home the same day of the procedure, but in rare cases admission to the hospital for observation and treatment is required.  After our discussion, the patient elected to proceed with the procedure  Recommendations: Robotic Assisted navigational bronchoscopy Endobronchial Ultrasound with TBNA    Return if symptoms worsen or fail to improve.  I spent 45 minutes caring for this patient today, including preparing to see the patient, obtaining a medical history , reviewing a separately obtained history, performing a medically appropriate examination and/or evaluation, counseling and educating the patient/family/caregiver, ordering medications, tests, or procedures, documenting clinical information in the electronic health record, and independently interpreting results (not separately reported/billed) and communicating results to the  patient/family/caregiver  Biff Rutigliano, MD [facility] 02/17/2023 4:23 PM    End of visit medications:  No orders of the defined types were placed in this encounter.    Current Outpatient Medications:    albuterol (VENTOLIN HFA) 108 (90 Base) MCG/ACT inhaler, INHALE 1 TO 2 PUFFS EVERY 4 HOURS AS NEEDED FOR WHEEZING OR SHORTNESS OF BREATH, Disp: 1 each, Rfl: 2   cholecalciferol (VITAMIN D3) 25 MCG (1000 UNIT) tablet, Take 1,000 Units by mouth daily., Disp: , Rfl:    cyclobenzaprine (FLEXERIL) 10 MG tablet, Take 1 tablet (10 mg total) by mouth 3 (three) times daily as needed for muscle spasms., Disp: 30 tablet, Rfl: 0   fludrocortisone (FLORINEF) 0.1 MG tablet, Take 0.1 mg by mouth daily., Disp: , Rfl:    fluticasone (FLONASE) 50 MCG/ACT nasal spray, Place 2 sprays into both nostrils daily., Disp: 48 g, Rfl: 3   HYDROcodone-acetaminophen (NORCO/VICODIN) 5-325 MG tablet, Take 1 tablet by mouth every 6 (six) hours as needed for moderate pain., Disp: 30 tablet, Rfl: 0   loratadine (CLARITIN) 10 MG tablet, Take 1 tablet (10 mg total) by mouth daily. Use for 4-6 weeks then stop, and use as needed or seasonally, Disp: 30 tablet, Rfl: 11   methocarbamol (ROBAXIN) 500 MG tablet, Take 1 tablet (500 mg total) by mouth 4 (four) times daily., Disp: 20 tablet, Rfl: 0   naproxen (NAPROSYN) 500 MG tablet, Take 1 tablet (500 mg total) by mouth 2 (two) times daily with a meal., Disp: 10 tablet, Rfl: 0   OVER THE COUNTER MEDICATION, Equate allergy med, Disp: , Rfl:    rosuvastatin (CRESTOR) 40 MG tablet, Take 1 tablet (40 mg total) by mouth every other day., Disp: 45 tablet, Rfl: 3   sildenafil (REVATIO) 20 MG tablet, TAKE 1 TO 5 TABLETS BY MOUTH   ABOUT 30 MINUTES PRIOR TO SEX.START WITH 1 THEN INCREASE, Disp: 30 tablet, Rfl: 5   STIOLTO RESPIMAT 2.5-2.5 MCG/ACT AERS, INHALE 2 PUFFS INTO THE LUNGS DAILY., Disp: 12 g, Rfl: 3   traMADol (ULTRAM) 50 MG tablet, Take 50 mg by mouth every 8  (eight) hours., Disp: , Rfl:    vitamin B-12 (CYANOCOBALAMIN) 1000 MCG tablet, Take 1 tablet (1,000 mcg total) by mouth daily., Disp: , Rfl:    vitamin C (ASCORBIC ACID) 500 MG tablet, Take 500 mg by mouth daily. Am, Disp: , Rfl:    aspirin EC 325 MG EC tablet, Take 1 tablet (325 mg total) by mouth daily. (Patient not taking: Reported on 02/09/2023), Disp: 90 tablet, Rfl: 0   Subjective:   [name] GENDER: male DOB: 03/08/1956, MRN: [id number]  Chief Complaint  Patient presents with   Consult    Lung nodule. DOE. No wheezing or cough.     HPI  Patient is a pleasant 66 year old male presenting to clinic for the evaluation of a right upper lobe mass.  Patient presented to the ED on 02/05/2023 with chest tightness, weakness, and fatigue. Imaging was notable for a RUL hilar mass confirmed with CT of the chest showing an 8 cm RUL mass concerning for malignancy. Patient was referred to oncology, underwent PET/CT and MRI brain (results pending) and is referred to pulmonary for biopsy.  Patient reports chest discomfort of one month in duration. He doesn't endorse any shortness of breath, chest tightness, or wheezing. He has an occasional cough. He is in his usual state of health and denies any fevers, chills, night sweats, or weight loss. He denies hemoptysis.  Patient has a long standing history of smoking, quit in 2019. He smoked between 1 and 2 packs a day for around 50 years, with between 75 and 100 pack year smoking history. He is originally from [place]. Past medical history is notable for a stroke, with residual left sided weakness.  Ancillary information including prior medications, full medical/surgical/family/social histories, and PFTs (when available) are listed below and have been reviewed.   Review of Systems  Constitutional:  Positive for malaise/fatigue. Negative for chills, fever and weight loss.  Respiratory:  Negative for cough, hemoptysis, sputum production,  shortness of breath and wheezing.   Cardiovascular:  Positive for chest pain. Negative for palpitations and leg swelling.     Objective:   Vitals:   02/17/23 0931  BP: 110/70  Pulse: 78  Temp: (!) 97.1 F (36.2 C)  SpO2: 96%  Weight: 143 lb 12.8 oz (65.2 kg)  Height: 6' (1.829 m)   96% on RA BMI Readings from Last 3 Encounters:  02/17/23 19.50 kg/m  02/09/23 19.80 kg/m  02/05/23 20.61 kg/m   Wt Readings from Last 3 Encounters:  02/17/23 143 lb 12.8 oz (65.2 kg)  02/09/23 146 lb (66.2 kg)  02/05/23 152 lb (68.9 kg)    Physical Exam Constitutional:      Appearance: Normal appearance. He is not ill-appearing.  HENT:     Mouth/Throat:     Mouth: Mucous membranes are moist.  Cardiovascular:     Rate and Rhythm: Normal rate and regular rhythm.     Pulses: Normal pulses.     Heart sounds: Normal heart sounds.  Pulmonary:     Effort: Pulmonary effort is normal.     Breath sounds: Normal breath sounds.  Abdominal:     Palpations: Abdomen is soft.  Neurological:       Mental Status: He is alert. Mental status is at baseline.       Ancillary Information    Past Medical History:  Diagnosis Date   COPD (chronic obstructive pulmonary disease) (HCC)    mild   COVID-19 09/2019   Erectile dysfunction    Headache    daily, stress headaches   Knee pain, left    Seasonal allergies    Stroke (HCC)    Tobacco dependence      Family History  Problem Relation Age of Onset   Diabetes Mother    Heart attack Mother 62   Cancer Father        liver    COPD Brother    HIV/AIDS Brother    Prostate cancer Neg Hx    Colon cancer Neg Hx      Past Surgical History:  Procedure Laterality Date   BACK SURGERY     metal plate in back   COLONOSCOPY     COLONOSCOPY WITH PROPOFOL N/A 04/26/2019   Procedure: COLONOSCOPY WITH PROPOFOL;  Surgeon: Anna, Kiran, MD;  Location: [facility] ENDOSCOPY;  Service: Gastroenterology;  Laterality: N/A;   HERNIA REPAIR Right    IR ANGIO INTRA  EXTRACRAN SEL COM CAROTID INNOMINATE UNI L MOD SED  03/27/2020   IR ANGIO VERTEBRAL SEL SUBCLAVIAN INNOMINATE UNI L MOD SED  03/27/2020   IR ANGIO VERTEBRAL SEL VERTEBRAL UNI R MOD SED  03/27/2020   IR CT HEAD LTD  03/27/2020   IR PERCUTANEOUS ART THROMBECTOMY/INFUSION INTRACRANIAL INC DIAG ANGIO  03/27/2020   KNEE ARTHROSCOPY Left 03/28/2015   Procedure: Arthroscopic partial medial meniscectomy plus chondral debridement;  Surgeon: Harold Kernodle, MD;  Location: [facility];  Service: Orthopedics;  Laterality: Left;   RADIOLOGY WITH ANESTHESIA N/A 03/27/2020   Procedure: IR WITH ANESTHESIA;  Surgeon: Deveshwar, Sanjeev, MD;  Location: [facility];  Service: Radiology;  Laterality: N/A;    Social History   Socioeconomic History   Marital status: Married    Spouse name: Joan   Number of children: 0   Years of education: Not on file   Highest education level: Some college, no degree  Occupational History   Not on file  Tobacco Use   Smoking status: Former    Packs/day: 1.50    Years: 49.00    Additional pack years: 0.00    Total pack years: 73.50    Types: Cigarettes    Start date: 1970    Quit date: 09/14/2017    Years since quitting: 5.4   Smokeless tobacco: Former  Vaping Use   Vaping Use: Never used  Substance and Sexual Activity   Alcohol use: Yes    Comment: 6 pack on weekend   Drug use: No   Sexual activity: Not on file  Other Topics Concern   Not on file  Social History Narrative   Lives at home w wife   L handed   Caffeine: 1 C of coffee AM   Social Determinants of Health   Financial Resource Strain: Low Risk  (02/09/2023)   Overall Financial Resource Strain (CARDIA)    Difficulty of Paying Living Expenses: Not hard at all  Food Insecurity: No Food Insecurity (02/09/2023)   Hunger Vital Sign    Worried About Running Out of Food in the Last Year: Never true    Ran Out of Food in the Last Year: Never true  Transportation Needs: No Transportation Needs (02/09/2023)    PRAPARE - Transportation    Lack of Transportation (  Medical): No    Lack of Transportation (Non-Medical): No  Physical Activity: Insufficiently Active (11/05/2022)   Exercise Vital Sign    Days of Exercise per Week: 3 days    Minutes of Exercise per Session: 20 min  Stress: No Stress Concern Present (11/05/2022)   [employer] - Occupational Stress Questionnaire    Feeling of Stress : Not at all  Social Connections: Socially Isolated (11/05/2022)   Social Connection and Isolation Panel [NHANES]    Frequency of Communication with Friends and Family: Once a week    Frequency of Social Gatherings with Friends and Family: Never    Attends Religious Services: Never    Active Member of Clubs or Organizations: No    Attends Club or Organization Meetings: Never    Marital Status: Married  Intimate Partner Violence: Not At Risk (02/09/2023)   Humiliation, Afraid, Rape, and Kick questionnaire    Fear of Current or Ex-Partner: No    Emotionally Abused: No    Physically Abused: No    Sexually Abused: No     No Known Allergies   CBC    Component Value Date/Time   WBC 15.0 (H) 02/05/2023 1021   RBC 4.18 (L) 02/05/2023 1133   RBC 4.25 02/05/2023 1021   HGB 12.1 (L) 02/05/2023 1021   HCT 39.7 02/05/2023 1021   PLT 407 (H) 02/05/2023 1021   MCV 93.4 02/05/2023 1021   MCH 28.5 02/05/2023 1021   MCHC 30.5 02/05/2023 1021   RDW 15.3 02/05/2023 1021   LYMPHSABS 2,302 12/22/2022 0808   MONOABS 0.7 04/02/2020 0548   EOSABS 301 12/22/2022 0808   BASOSABS 69 12/22/2022 0808    Pulmonary Functions Testing Results:    Latest Ref Rng & Units 12/21/2019    8:39 AM  PFT Results  FVC-Pre L 4.96   FVC-Predicted Pre % 96   FVC-Post L 5.19   FVC-Predicted Post % 101   Pre FEV1/FVC % % 59   Post FEV1/FCV % % 57   FEV1-Pre L 2.92   FEV1-Predicted Pre % 76   FEV1-Post L 2.97   DLCO uncorrected ml/min/mmHg 24.81   DLCO UNC% % 84   DLCO corrected ml/min/mmHg 24.81   DLCO  COR %Predicted % 84   DLVA Predicted % 74     Outpatient Medications Prior to Visit  Medication Sig Dispense Refill   albuterol (VENTOLIN HFA) 108 (90 Base) MCG/ACT inhaler INHALE 1 TO 2 PUFFS EVERY 4 HOURS AS NEEDED FOR WHEEZING OR SHORTNESS OF BREATH 1 each 2   cholecalciferol (VITAMIN D3) 25 MCG (1000 UNIT) tablet Take 1,000 Units by mouth daily.     cyclobenzaprine (FLEXERIL) 10 MG tablet Take 1 tablet (10 mg total) by mouth 3 (three) times daily as needed for muscle spasms. 30 tablet 0   fludrocortisone (FLORINEF) 0.1 MG tablet Take 0.1 mg by mouth daily.     fluticasone (FLONASE) 50 MCG/ACT nasal spray Place 2 sprays into both nostrils daily. 48 g 3   HYDROcodone-acetaminophen (NORCO/VICODIN) 5-325 MG tablet Take 1 tablet by mouth every 6 (six) hours as needed for moderate pain. 30 tablet 0   loratadine (CLARITIN) 10 MG tablet Take 1 tablet (10 mg total) by mouth daily. Use for 4-6 weeks then stop, and use as needed or seasonally 30 tablet 11   methocarbamol (ROBAXIN) 500 MG tablet Take 1 tablet (500 mg total) by mouth 4 (four) times daily. 20 tablet 0   naproxen (NAPROSYN) 500 MG tablet Take   1 tablet (500 mg total) by mouth 2 (two) times daily with a meal. 10 tablet 0   OVER THE COUNTER MEDICATION Equate allergy med     rosuvastatin (CRESTOR) 40 MG tablet Take 1 tablet (40 mg total) by mouth every other day. 45 tablet 3   sildenafil (REVATIO) 20 MG tablet TAKE 1 TO 5 TABLETS BY MOUTH ABOUT 30 MINUTES PRIOR TO SEX.START WITH 1 THEN INCREASE 30 tablet 5   STIOLTO RESPIMAT 2.5-2.5 MCG/ACT AERS INHALE 2 PUFFS INTO THE LUNGS DAILY. 12 g 3   traMADol (ULTRAM) 50 MG tablet Take 50 mg by mouth every 8 (eight) hours.     vitamin B-12 (CYANOCOBALAMIN) 1000 MCG tablet Take 1 tablet (1,000 mcg total) by mouth daily.     vitamin C (ASCORBIC ACID) 500 MG tablet Take 500 mg by mouth daily. Am     aspirin EC 325 MG EC tablet Take 1 tablet (325 mg total) by mouth daily. (Patient not taking: Reported on  02/09/2023) 90 tablet 0   No facility-administered medications prior to visit.   

## 2023-02-18 ENCOUNTER — Telehealth: Payer: Self-pay

## 2023-02-18 NOTE — Telephone Encounter (Signed)
Message sent through secure chat by Dr. Aundria Rud, I scheduled Ethan Fitzgerald for a bronch on 6/27. he'll need his super D scan prior to that, and to be informed of all the details. thank you!   Per Vara Guardian, she has scheduled the CT. She has spoke with the patient's wife(DPR) and notified her of his CT and Bronch appt time and dates.   Nothing further needed.

## 2023-02-23 ENCOUNTER — Other Ambulatory Visit: Payer: Self-pay | Admitting: *Deleted

## 2023-02-23 MED ORDER — HYDROCODONE-ACETAMINOPHEN 5-325 MG PO TABS: 1.0000 | ORAL_TABLET | Freq: Four times a day (QID) | ORAL | 0 refills | Status: DC | PRN

## 2023-03-02 ENCOUNTER — Encounter (HOSPITAL_COMMUNITY): Payer: Self-pay | Admitting: Student in an Organized Health Care Education/Training Program

## 2023-03-02 ENCOUNTER — Ambulatory Visit
Admission: RE | Admit: 2023-03-02 | Discharge: 2023-03-02 | Disposition: A | Discharge status: Home / Self Care | Payer: Medicare HMO | Source: Ambulatory Visit | Attending: Student in an Organized Health Care Education/Training Program | Admitting: Student in an Organized Health Care Education/Training Program

## 2023-03-02 DIAGNOSIS — J432 Centrilobular emphysema: Secondary | ICD-10-CM | POA: Diagnosis not present

## 2023-03-02 DIAGNOSIS — R918 Other nonspecific abnormal finding of lung field: Secondary | ICD-10-CM | POA: Diagnosis not present

## 2023-03-02 DIAGNOSIS — C3411 Malignant neoplasm of upper lobe, right bronchus or lung: Secondary | ICD-10-CM | POA: Diagnosis not present

## 2023-03-02 DIAGNOSIS — I700 Atherosclerosis of aorta: Secondary | ICD-10-CM | POA: Diagnosis not present

## 2023-03-02 NOTE — Progress Notes (Signed)
SDW call  Patient's wife, Aurea Graff was given pre-op instructions over the phone. Aurea Graff verbalized understanding of instructions provided.    PCP - Dr. Lenon Ahmadi Cardiologist - Dr. Hebert Soho Oncolology: Dr. Gerarda Fraction Pulmonary:    PPM/ICD - denies  Chest x-ray - 02/05/2023 EKG -  02/07/2023 Stress Test - ECHO - 03/27/2020 Cardiac Cath -   Sleep Study/sleep apnea/CPAP: denies  Non-diabetic   Blood Thinner Instructions: denies Aspirin Instructions:States MD took him off ASA approximately 2 weeks ago   ERAS Protcol - No, NPO PRE-SURGERY Ensure or G2-    COVID TEST- n/a    Anesthesia review: Yes.  Stroke, COPD   Patient denies shortness of breath, fever, cough and chest pain over the phone call  Your procedure is scheduled on Thursday March 03, 2023  Report to [facility] Main Entrance "A" at  0830  A.M., then check in with the Admitting office.  Call this number if you have problems the morning of surgery:  [phone]   If you have any questions prior to your surgery date call [phone]: Open Monday-Friday 8am-4pm If you experience any cold or flu symptoms such as cough, fever, chills, shortness of breath, etc. between now and your scheduled surgery, please notify us at the above number    Remember:  Do not eat or drink after midnight the night before your surgery  Take these medicines the morning of surgery with A SIP OF WATER:  Florinef, flonase, midodrine, crestor, stiolto respirat inhaler  As needed: Albuterol, flexeril, norco, claritin, tramadol  As of today, STOP taking any Aspirin (unless otherwise instructed by your surgeon) Aleve, Naproxen, Ibuprofen, Motrin, Advil, Goody's, BC's, all herbal medications, fish oil, and all vitamins.

## 2023-03-02 NOTE — Progress Notes (Signed)
Case: [id number] Date/Time: 03/03/23 1100   Procedure: ROBOTIC ASSISTED NAVIGATIONAL BRONCHOSCOPY (Bilateral)   Anesthesia type: General   Diagnosis: Mass of upper lobe of right lung [R91.8]   Pre-op diagnosis: lung mass   [facility]   Surgeons: Raechel Chute, MD       DISCUSSION: Ethan Fitzgerald is a 67 year old male who is being evaluated as a same-day workup for procedure listed above.  Past medical history significant for former smoking, COPD, history of CVA (in 2021), carotid artery disease with right ICA occlusion, headaches, depression.  No prior anesthesia complications  Patient presented to the ED on 02/05/2023 for lightheadedness.  A chest x-ray was obtained which was concerning for lung mass.  Patient subsequently followed up with oncology on 02/09/2023 and imaging was ordered for staging.  Then followed up with pulmonology on 02/17/2023 and is scheduled for bronchoscopy with biopsy of the right upper lobe lung mass.  Patient last saw cardiology on 12/14/2022 who he sees for PAD and orthostasis.  He was started on Florinef for hypotension.  Does not have any underlying cardiac disease.   VS:     03/02/2023    8:39 AM 02/17/2023    9:31 AM 02/09/2023   11:30 AM  Vitals with BMI  Height 6\' 0"  6\' 0"  6\' 0"   Weight 142 lbs 143 lbs 13 oz 146 lbs  BMI 19.25 19.5 19.8  Systolic  110 81  Diastolic  70 61  Pulse  78 95     PROVIDERS: Smitty Cords, DO Cardiologist - Dr. Julien Nordmann Oncolology: Dr. Gerarda Fraction Neurology: Delia Heady, MD  LABS: Labs reviewed: Acceptable for surgery. (all labs ordered are listed, but only abnormal results are displayed)  Labs Reviewed - No data to display   IMAGES:  MRI Brain 02/11/23:    IMPRESSION: 1. No evidence of intracranial metastases. 2. Chronic right MCA infarct.  NM PET scan 02/14/23:   IMPRESSION: 7.8 cm right upper lobe mass, compatible with primary  bronchogenic carcinoma.   No findings suspicious for metastatic disease.  CT Chest/Abdomen/Pelvis 02/05/23:  IMPRESSION: 8 cm right upper lobe lung mass, which appears to involve the major fissure and superior segment of the right lower lobe. Probable mediastinal involvement also noted in the right paraspinal region. This is consistent with primary bronchogenic carcinoma.   Nonspecific nodular thickening of right adrenal gland. Differential diagnosis includes adrenal metastasis, adenoma, or nodular hyperplasia. Consider further evaluation with PET-CT.   No thoracic lymphadenopathy or other sites of metastatic disease identified.   Cholelithiasis. No radiographic evidence of cholecystitis.   Aortic Atherosclerosis (ICD10-I70.0) and Emphysema (ICD10-J43.9).     EKG 02/05/23:  Sinus tachycardia, rate 114 (presented to the ED)   CV:  US Carotid 12/30/22:  Summary:  Right Carotid: Evidence consistent with a total occlusion of the right  ICA.   Left Carotid: The left ICA, previously observed to be occluded on CTA,                demonstrates a narrow tract of flow extending from the                bifurcation.                  Retrograde left ophthalmic flow noted on TCD.   Echo 03/27/20:  IMPRESSIONS     1. Technically difficult study with limited views. Left ventricular  ejection fraction, by estimation, is grossly 55 to 60%. The  left ventricle  has normal function. Left ventricular diastolic parameters were normal.   2. Right ventricular systolic function is normal. The right ventricular  size is normal. Tricuspid regurgitation signal is inadequate for assessing  PA pressure.   3. The mitral valve is normal in structure. No evidence of mitral valve  regurgitation.   4. The aortic valve was not well visualized. Aortic valve regurgitation  is not visualized. No aortic stenosis is present.   Past Medical History:  Diagnosis Date   COPD (chronic obstructive pulmonary  disease) (HCC)    mild   COVID-19 09/2019   Depression    Erectile dysfunction    Headache    daily, stress headaches   Knee pain, left    Seasonal allergies    Stroke [facility])    Tobacco dependence     Past Surgical History:  Procedure Laterality Date   BACK SURGERY     metal plate in back   COLONOSCOPY     COLONOSCOPY WITH PROPOFOL N/A 04/26/2019   Procedure: COLONOSCOPY WITH PROPOFOL;  Surgeon: Wyline Mood, MD;  Location[facility];  Service: Gastroenterology;  Laterality: N/A;   HERNIA REPAIR Right    IR ANGIO INTRA EXTRACRAN SEL COM CAROTID INNOMINATE UNI L MOD SED  03/27/2020   IR ANGIO VERTEBRAL SEL SUBCLAVIAN INNOMINATE UNI L MOD SED  03/27/2020   IR ANGIO VERTEBRAL SEL VERTEBRAL UNI R MOD SED  03/27/2020   IR CT HEAD LTD  03/27/2020   IR PERCUTANEOUS ART THROMBECTOMY/INFUSION INTRACRANIAL INC DIAG ANGIO  03/27/2020   KNEE ARTHROSCOPY Left 03/28/2015   Procedure: Arthroscopic partial medial meniscectomy plus chondral debridement;  Surgeon: Erin Sons, MD;  Location[facility];  Service: Orthopedics;  Laterality: Left;   RADIOLOGY WITH ANESTHESIA N/A 03/27/2020   Procedure: IR WITH ANESTHESIA;  Surgeon: Julieanne Cotton, MD;  Location: [facility];  Service: Radiology;  Laterality: N/A;    MEDICATIONS: No current facility-administered medications for this encounter.    fludrocortisone (FLORINEF) 0.1 MG tablet   HYDROcodone-acetaminophen (NORCO/VICODIN) 5-325 MG tablet   midodrine (PROAMATINE) 5 MG tablet   rosuvastatin (CRESTOR) 40 MG tablet   albuterol (VENTOLIN HFA) 108 (90 Base) MCG/ACT inhaler   aspirin EC 325 MG EC tablet   cholecalciferol (VITAMIN D3) 25 MCG (1000 UNIT) tablet   cyclobenzaprine (FLEXERIL) 10 MG tablet   fluticasone (FLONASE) 50 MCG/ACT nasal spray   loratadine (CLARITIN) 10 MG tablet   methocarbamol (ROBAXIN) 500 MG tablet   naproxen (NAPROSYN) 500 MG tablet   OVER THE COUNTER MEDICATION   sildenafil (REVATIO) 20 MG tablet   STIOLTO  RESPIMAT 2.5-2.5 MCG/ACT AERS   vitamin B-12 (CYANOCOBALAMIN) 1000 MCG tablet   vitamin C (ASCORBIC ACID) 500 MG tablet   Ubaldo Glassing, PA-C [facility] Short Stay/Anesthesiology [facility] Phone [phone] 03/02/2023 10:11 AM

## 2023-03-02 NOTE — Anesthesia Preprocedure Evaluation (Signed)
Anesthesia Evaluation  Patient identified by MRN, date of birth, ID band Patient awake    Reviewed: Allergy & Precautions, NPO status , Patient's Chart, lab work & pertinent test results  Airway Mallampati: II  TM Distance: >3 FB Neck ROM: Full    Dental  (+) Dental Advisory Given   Pulmonary COPD, former smoker   breath sounds clear to auscultation       Cardiovascular + Peripheral Vascular Disease   Rhythm:Regular Rate:Normal     Neuro/Psych CVA, Residual Symptoms    GI/Hepatic negative GI ROS, Neg liver ROS,,,  Endo/Other  negative endocrine ROS    Renal/GU negative Renal ROS     Musculoskeletal   Abdominal   Peds  Hematology negative hematology ROS (+)   Anesthesia Other Findings   Reproductive/Obstetrics                             Anesthesia Physical Anesthesia Plan  ASA: 2  Anesthesia Plan: General   Post-op Pain Management: Tylenol PO (pre-op)*   Induction:   PONV Risk Score and Plan: 2 and Dexamethasone, Ondansetron and Treatment may vary due to age or medical condition  Airway Management Planned: Oral ETT  Additional Equipment:   Intra-op Plan:   Post-operative Plan: Extubation in OR  Informed Consent: I have reviewed the patients History and Physical, chart, labs and discussed the procedure including the risks, benefits and alternatives for the proposed anesthesia with the patient or authorized representative who has indicated his/her understanding and acceptance.     Dental advisory given  Plan Discussed with: CRNA  Anesthesia Plan Comments: ( )        Anesthesia Quick Evaluation

## 2023-03-03 ENCOUNTER — Other Ambulatory Visit: Payer: Self-pay

## 2023-03-03 ENCOUNTER — Encounter (HOSPITAL_COMMUNITY)
Admission: RE | Disposition: A | Discharge status: Home / Self Care | Payer: Self-pay | Source: Home / Self Care | Attending: Student in an Organized Health Care Education/Training Program

## 2023-03-03 ENCOUNTER — Ambulatory Visit (HOSPITAL_BASED_OUTPATIENT_CLINIC_OR_DEPARTMENT_OTHER): Payer: Medicare HMO | Admitting: Medical

## 2023-03-03 ENCOUNTER — Ambulatory Visit (HOSPITAL_COMMUNITY): Payer: Medicare HMO | Admitting: Medical

## 2023-03-03 ENCOUNTER — Ambulatory Visit (HOSPITAL_COMMUNITY)
Admission: RE | Admit: 2023-03-03 | Discharge: 2023-03-03 | Disposition: A | Discharge status: Home / Self Care | Payer: Medicare HMO | Attending: Student in an Organized Health Care Education/Training Program | Admitting: Student in an Organized Health Care Education/Training Program

## 2023-03-03 ENCOUNTER — Encounter (HOSPITAL_COMMUNITY): Payer: Self-pay | Admitting: Student in an Organized Health Care Education/Training Program

## 2023-03-03 ENCOUNTER — Ambulatory Visit (HOSPITAL_COMMUNITY): Payer: Medicare HMO

## 2023-03-03 DIAGNOSIS — J449 Chronic obstructive pulmonary disease, unspecified: Secondary | ICD-10-CM | POA: Diagnosis not present

## 2023-03-03 DIAGNOSIS — I739 Peripheral vascular disease, unspecified: Secondary | ICD-10-CM

## 2023-03-03 DIAGNOSIS — Z87891 Personal history of nicotine dependence: Secondary | ICD-10-CM | POA: Insufficient documentation

## 2023-03-03 DIAGNOSIS — R590 Localized enlarged lymph nodes: Secondary | ICD-10-CM | POA: Insufficient documentation

## 2023-03-03 DIAGNOSIS — J432 Centrilobular emphysema: Secondary | ICD-10-CM | POA: Insufficient documentation

## 2023-03-03 DIAGNOSIS — Z8673 Personal history of transient ischemic attack (TIA), and cerebral infarction without residual deficits: Secondary | ICD-10-CM | POA: Diagnosis not present

## 2023-03-03 DIAGNOSIS — R918 Other nonspecific abnormal finding of lung field: Secondary | ICD-10-CM | POA: Insufficient documentation

## 2023-03-03 DIAGNOSIS — C3411 Malignant neoplasm of upper lobe, right bronchus or lung: Secondary | ICD-10-CM | POA: Insufficient documentation

## 2023-03-03 DIAGNOSIS — C3490 Malignant neoplasm of unspecified part of unspecified bronchus or lung: Secondary | ICD-10-CM | POA: Diagnosis not present

## 2023-03-03 HISTORY — PX: VIDEO BRONCHOSCOPY WITH ENDOBRONCHIAL ULTRASOUND: SHX6177

## 2023-03-03 HISTORY — PX: FINE NEEDLE ASPIRATION: SHX6590

## 2023-03-03 HISTORY — DX: Depression, unspecified: F32.A

## 2023-03-03 SURGERY — BRONCHOSCOPY, WITH EBUS
Anesthesia: General

## 2023-03-03 MED ORDER — FENTANYL CITRATE (PF) 100 MCG/2ML IJ SOLN: 25.0000 ug | INTRAMUSCULAR | Status: DC | PRN

## 2023-03-03 MED ORDER — SUGAMMADEX SODIUM 200 MG/2ML IV SOLN
INTRAVENOUS | Status: DC | PRN
  Administered 2023-03-03: 200 mg via INTRAVENOUS

## 2023-03-03 MED ORDER — ONDANSETRON HCL 4 MG/2ML IJ SOLN
INTRAMUSCULAR | Status: DC | PRN
  Administered 2023-03-03: 4 mg via INTRAVENOUS

## 2023-03-03 MED ORDER — PROPOFOL 10 MG/ML IV BOLUS
INTRAVENOUS | Status: DC | PRN
  Administered 2023-03-03: 150 mg via INTRAVENOUS

## 2023-03-03 MED ORDER — FENTANYL CITRATE (PF) 100 MCG/2ML IJ SOLN
INTRAMUSCULAR | Status: DC | PRN
  Administered 2023-03-03 (×2): 50 ug via INTRAVENOUS

## 2023-03-03 MED ORDER — ACETAMINOPHEN 500 MG PO TABS
1000.0000 mg | ORAL_TABLET | Freq: Once | ORAL | Status: AC
  Administered 2023-03-03: 1000 mg via ORAL

## 2023-03-03 MED ORDER — AMISULPRIDE (ANTIEMETIC) 5 MG/2ML IV SOLN: 10.0000 mg | Freq: Once | INTRAVENOUS | Status: DC | PRN

## 2023-03-03 MED ORDER — CHLORHEXIDINE GLUCONATE 0.12 % MT SOLN
OROMUCOSAL | Status: AC
  Administered 2023-03-03: 15 mL via OROMUCOSAL
  Filled 2023-03-03: qty 15

## 2023-03-03 MED ORDER — DEXAMETHASONE SODIUM PHOSPHATE 10 MG/ML IJ SOLN
INTRAMUSCULAR | Status: DC | PRN
  Administered 2023-03-03: 10 mg via INTRAVENOUS

## 2023-03-03 MED ORDER — ROCURONIUM BROMIDE 10 MG/ML (PF) SYRINGE
PREFILLED_SYRINGE | INTRAVENOUS | Status: DC | PRN
  Administered 2023-03-03: 40 mg via INTRAVENOUS
  Administered 2023-03-03: 20 mg via INTRAVENOUS

## 2023-03-03 MED ORDER — ACETAMINOPHEN 500 MG PO TABS
ORAL_TABLET | ORAL | Status: AC
  Filled 2023-03-03: qty 2

## 2023-03-03 MED ORDER — LIDOCAINE 2% (20 MG/ML) 5 ML SYRINGE
INTRAMUSCULAR | Status: DC | PRN
  Administered 2023-03-03: 60 mg via INTRAVENOUS

## 2023-03-03 MED ORDER — LACTATED RINGERS IV SOLN: INTRAVENOUS | Status: DC

## 2023-03-03 MED ORDER — CHLORHEXIDINE GLUCONATE 0.12 % MT SOLN: 15.0000 mL | Freq: Once | OROMUCOSAL | Status: AC

## 2023-03-03 MED ORDER — PHENYLEPHRINE HCL-NACL 20-0.9 MG/250ML-% IV SOLN
INTRAVENOUS | Status: DC | PRN
  Administered 2023-03-03: 40 ug/min via INTRAVENOUS

## 2023-03-03 MED ORDER — PROPOFOL 500 MG/50ML IV EMUL
INTRAVENOUS | Status: DC | PRN
  Administered 2023-03-03: 125 ug/kg/min via INTRAVENOUS

## 2023-03-03 NOTE — Op Note (Signed)
Flexible and EBUS Bronchoscopy Procedure Note  Ethan Fitzgerald  [id number]  07/01/1956  Date:03/03/23  Time:12:23 PM   Provider Performing:Georjean Toya   Procedure: Flexible bronchoscopy and EBUS Bronchoscopy  Indication(s) RUL mass  Consent Risks of the procedure as well as the alternatives and risks of each were explained to the patient and/or caregiver.  Consent for the procedure was obtained.  Anesthesia General Anesthesia   Time Out Verified patient identification, verified procedure, site/side was marked, verified correct patient position, special equipment/implants available, medications/allergies/relevant history reviewed, required imaging and test results available.   Sterile Technique Usual hand hygiene, masks, gowns, and gloves were used   Procedure Description Diagnostic bronchoscope advanced through endotracheal tube and into airway.  Airways were examined down to subsegmental level with no abnormalities noted. There were no endobronchial lesions. There was mild extrinsic compression in the RUL bronchus.  Following diagnostic evaluation, the bronchoscope was then removed and the EBUS bronchoscope was advanced into airway. We evaluated the RUL mass with the EBUS and were able to visualize it through the RUL bronchus. At this point, we decided to proceed with biopsy of the mass with a ViziShot 2 needle (21 gauge) and samples were sent for slide and cytology. The sample was read as adequate, and decision was made to abandon the robotic portion of the procedure. After sampling the mass, we proceeded with evaluating the mediastinal and hilar lymph node stations. Stations 11L, 4L, and 11R superior were evaluated with a new ViziShot 2 needle (21 Gauge) and samples were sent for cytology.   The EBUS bronchoscope was removed after assuring no active bleeding from biopsy site.  Findings: RUL mass, biopsied with EBUS TBNA (21 gauge needle). Mediastinal and hilar lymphadenopathy  (11L, 4L and 11R superior sampled). No other enlarged lymph nodes encountered.   Complications/Tolerance None; patient tolerated the procedure well. Chest X-ray is needed post procedure.   EBL Minimal   Raechel Chute, MD [facility] 03/03/2023 12:26 PM

## 2023-03-03 NOTE — Transfer of Care (Signed)
Immediate Anesthesia Transfer of Care Note  Patient: Ethan Fitzgerald  Procedure(s) Performed: VIDEO BRONCHOSCOPY WITH ENDOBRONCHIAL ULTRASOUND FINE NEEDLE ASPIRATION  Patient Location: PACU  Anesthesia Type:General  Level of Consciousness: awake, alert , and oriented  Airway & Oxygen Therapy: Patient Spontanous Breathing  Post-op Assessment: Report given to RN, Post -op Vital signs reviewed and stable, and Patient moving all extremities X 4  Post vital signs: Reviewed and stable  Last Vitals:  Vitals Value Taken Time  BP 99/68 03/03/23 1231  Temp    Pulse 78 03/03/23 1234  Resp 17 03/03/23 1234  SpO2 91 % 03/03/23 1234  Vitals shown include unvalidated device data.  Last Pain:  Vitals:   03/03/23 0902  TempSrc:   PainSc: 6          Complications: No notable events documented.

## 2023-03-03 NOTE — Interval H&P Note (Signed)
S: Patient is in his usual state of health. No increase in baseline shortness of breath. No hemoptysis. No new neurological symptoms or weakness. Reports right upper chest pain that has been persistent since his initial visit with [place].  O: Vitals:   03/03/23 0819  BP: 130/89  Pulse: 94  Resp: 18  Temp: 98 F (36.7 C)  SpO2: 98%   General: Well-appearing and in no distress. Eyes: Anicteric, no conjunctival pallor HEENT: Mucous membranes moist, no evidence of postnasal drip Respiratory: Trachea is midline, no respiratory distress, good bilateral air entry, no wheezes, rales, or rhonchi Cardiovascular: Heart with regular rate and rhythm, normal S1 and S2, no murmurs, rubs, or gallops Neuro: Alert and oriented, no gross focal deficits  A&P:  Pleasant 67 year old male with history of COPD and previous stroke (on aspirin) presenting for the evaluation of a RUL mass. He is planned for robotic asssited navigational bronchoscopy with EBUS for tissue acquisition and mediastinal staging.  Raechel Chute, MD [facility] 03/03/2023 10:50 AM

## 2023-03-03 NOTE — Anesthesia Procedure Notes (Signed)
Procedure Name: Intubation Date/Time: 03/03/2023 11:15 AM  Performed by: Nils Pyle, CRNAPre-anesthesia Checklist: Patient identified, Emergency Drugs available, Suction available and Patient being monitored Patient Re-evaluated:Patient Re-evaluated prior to induction Oxygen Delivery Method: Circle System Utilized Preoxygenation: Pre-oxygenation with 100% oxygen Induction Type: IV induction Ventilation: Mask ventilation without difficulty and Oral airway inserted - appropriate to patient size Laryngoscope Size: Hyacinth Meeker and 2 Grade View: Grade I Tube type: Oral Tube size: 8.5 mm Number of attempts: 1 Airway Equipment and Method: Stylet and Oral airway Placement Confirmation: ETT inserted through vocal cords under direct vision, positive ETCO2 and breath sounds checked- equal and bilateral Secured at: 24 cm Tube secured with: Tape Dental Injury: Teeth and Oropharynx as per pre-operative assessment

## 2023-03-06 ENCOUNTER — Encounter (HOSPITAL_COMMUNITY): Payer: Self-pay | Admitting: Student in an Organized Health Care Education/Training Program

## 2023-03-07 NOTE — Anesthesia Postprocedure Evaluation (Signed)
Anesthesia Post Note  Patient: Ethan Fitzgerald  Procedure(s) Performed: VIDEO BRONCHOSCOPY WITH ENDOBRONCHIAL ULTRASOUND FINE NEEDLE ASPIRATION     Patient location during evaluation: PACU Anesthesia Type: General Level of consciousness: awake and alert Pain management: pain level controlled Vital Signs Assessment: post-procedure vital signs reviewed and stable Respiratory status: spontaneous breathing, nonlabored ventilation, respiratory function stable and patient connected to nasal cannula oxygen Cardiovascular status: blood pressure returned to baseline and stable Postop Assessment: no apparent nausea or vomiting Anesthetic complications: no   No notable events documented.  Last Vitals:  Vitals:   03/03/23 1330 03/03/23 1331  BP: 104/69   Pulse: 79 78  Resp: 18 17  Temp: (!) 36.4 C   SpO2: 92% 93%    Last Pain:  Vitals:   03/03/23 1330  TempSrc:   PainSc: 0-No pain                 Kennieth Rad

## 2023-03-09 ENCOUNTER — Inpatient Hospital Stay: Payer: Medicare HMO | Admitting: Oncology

## 2023-03-09 ENCOUNTER — Other Ambulatory Visit: Payer: Self-pay | Admitting: Student in an Organized Health Care Education/Training Program

## 2023-03-09 DIAGNOSIS — R918 Other nonspecific abnormal finding of lung field: Secondary | ICD-10-CM

## 2023-03-09 LAB — CYTOLOGY - NON PAP

## 2023-03-11 ENCOUNTER — Other Ambulatory Visit: Payer: Self-pay | Admitting: *Deleted

## 2023-03-11 DIAGNOSIS — R918 Other nonspecific abnormal finding of lung field: Secondary | ICD-10-CM

## 2023-03-15 ENCOUNTER — Encounter: Payer: Self-pay | Admitting: Oncology

## 2023-03-15 ENCOUNTER — Other Ambulatory Visit: Payer: Self-pay | Admitting: *Deleted

## 2023-03-15 ENCOUNTER — Encounter: Payer: Self-pay | Admitting: *Deleted

## 2023-03-15 ENCOUNTER — Inpatient Hospital Stay: Payer: Medicare HMO | Admitting: Oncology

## 2023-03-15 VITALS — BP 101/86 | HR 94 | Temp 97.6°F | Resp 20 | Wt 140.4 lb

## 2023-03-15 DIAGNOSIS — R42 Dizziness and giddiness: Secondary | ICD-10-CM | POA: Diagnosis not present

## 2023-03-15 DIAGNOSIS — Z7982 Long term (current) use of aspirin: Secondary | ICD-10-CM | POA: Insufficient documentation

## 2023-03-15 DIAGNOSIS — N200 Calculus of kidney: Secondary | ICD-10-CM | POA: Insufficient documentation

## 2023-03-15 DIAGNOSIS — C3411 Malignant neoplasm of upper lobe, right bronchus or lung: Secondary | ICD-10-CM | POA: Insufficient documentation

## 2023-03-15 DIAGNOSIS — D72829 Elevated white blood cell count, unspecified: Secondary | ICD-10-CM | POA: Insufficient documentation

## 2023-03-15 DIAGNOSIS — Z791 Long term (current) use of non-steroidal anti-inflammatories (NSAID): Secondary | ICD-10-CM | POA: Insufficient documentation

## 2023-03-15 DIAGNOSIS — R918 Other nonspecific abnormal finding of lung field: Secondary | ICD-10-CM | POA: Insufficient documentation

## 2023-03-15 DIAGNOSIS — Z510 Encounter for antineoplastic radiation therapy: Secondary | ICD-10-CM | POA: Diagnosis not present

## 2023-03-15 DIAGNOSIS — Z5111 Encounter for antineoplastic chemotherapy: Secondary | ICD-10-CM | POA: Insufficient documentation

## 2023-03-15 DIAGNOSIS — Z800 Family history of malignant neoplasm of digestive organs: Secondary | ICD-10-CM | POA: Insufficient documentation

## 2023-03-15 DIAGNOSIS — D509 Iron deficiency anemia, unspecified: Secondary | ICD-10-CM | POA: Insufficient documentation

## 2023-03-15 DIAGNOSIS — I700 Atherosclerosis of aorta: Secondary | ICD-10-CM | POA: Insufficient documentation

## 2023-03-15 DIAGNOSIS — R634 Abnormal weight loss: Secondary | ICD-10-CM | POA: Diagnosis not present

## 2023-03-15 DIAGNOSIS — Z8673 Personal history of transient ischemic attack (TIA), and cerebral infarction without residual deficits: Secondary | ICD-10-CM | POA: Insufficient documentation

## 2023-03-15 DIAGNOSIS — J432 Centrilobular emphysema: Secondary | ICD-10-CM | POA: Insufficient documentation

## 2023-03-15 DIAGNOSIS — I2510 Atherosclerotic heart disease of native coronary artery without angina pectoris: Secondary | ICD-10-CM | POA: Insufficient documentation

## 2023-03-15 DIAGNOSIS — K8020 Calculus of gallbladder without cholecystitis without obstruction: Secondary | ICD-10-CM | POA: Insufficient documentation

## 2023-03-15 DIAGNOSIS — R531 Weakness: Secondary | ICD-10-CM | POA: Diagnosis not present

## 2023-03-15 DIAGNOSIS — Z8616 Personal history of COVID-19: Secondary | ICD-10-CM | POA: Insufficient documentation

## 2023-03-15 DIAGNOSIS — Z87891 Personal history of nicotine dependence: Secondary | ICD-10-CM | POA: Insufficient documentation

## 2023-03-15 DIAGNOSIS — Z7952 Long term (current) use of systemic steroids: Secondary | ICD-10-CM | POA: Insufficient documentation

## 2023-03-15 DIAGNOSIS — G893 Neoplasm related pain (acute) (chronic): Secondary | ICD-10-CM | POA: Insufficient documentation

## 2023-03-15 DIAGNOSIS — Z79899 Other long term (current) drug therapy: Secondary | ICD-10-CM | POA: Insufficient documentation

## 2023-03-15 DIAGNOSIS — J449 Chronic obstructive pulmonary disease, unspecified: Secondary | ICD-10-CM | POA: Diagnosis not present

## 2023-03-15 DIAGNOSIS — R079 Chest pain, unspecified: Secondary | ICD-10-CM | POA: Diagnosis not present

## 2023-03-15 MED ORDER — OXYCODONE HCL 10 MG PO TABS: 10.0000 mg | ORAL_TABLET | Freq: Four times a day (QID) | ORAL | 0 refills | Status: DC | PRN

## 2023-03-15 NOTE — Progress Notes (Signed)
START ON PATHWAY REGIMEN - Non-Small Cell Lung     A cycle is every 7 days, concurrent with RT:     Paclitaxel      Carboplatin   **Always confirm dose/schedule in your pharmacy ordering system**  Patient Characteristics: Preoperative or Nonsurgical Candidate (Clinical Staging), Stage III - Nonsurgical Candidate (Nonsquamous and Squamous), PS = 0, 1 Therapeutic Status: Preoperative or Nonsurgical Candidate (Clinical Staging) AJCC T Category: cT4 AJCC N Category: cN0 AJCC M Category: cM0 AJCC 8 Stage Grouping: IIIA ECOG Performance Status: 0 Intent of Therapy: Curative Intent, Discussed with Patient 

## 2023-03-15 NOTE — Progress Notes (Signed)
Met with patient and his wife during follow up visit with Dr. Orlie Dakin. All questions answered during visit. Reviewed upcoming appts. Nothing further needed at this time. Instructed to call with any questions or needs. Pt verbalized understanding.

## 2023-03-15 NOTE — Progress Notes (Signed)
[facility]  [phone] [phone]) [phone]  ID: Ethan Fitzgerald OB: 07/19/1956  MR#: [id number]  AOZ#:[id number]  Patient Care Team: Smitty Cords, DO as PCP - General (Family Medicine) Micki Riley, MD as Referring Physician (Neurology) Glory Buff, RN as Oncology Nurse Navigator  CHIEF COMPLAINT: Stage IIIa adenocarcinoma of the right upper lobe lung.  INTERVAL HISTORY: Patient returns to clinic today for further evaluation, discussion of his imaging and pathology results, and treatment planning.  He continues to have a fair appetite, but his weight has remained stable.  He also has pleuritic chest pain.  He has no neurologic complaints.  He denies any recent fevers or illnesses.  He has a fair appetite.  He has chest pain, but denies any shortness of breath, cough, or hemoptysis.  He has no nausea, vomiting, constipation, or diarrhea.  He has no urinary complaints.  Patient offers no further specific complaints today.  REVIEW OF SYSTEMS:   Review of Systems  Constitutional:  Positive for weight loss. Negative for fever and malaise/fatigue.  Respiratory: Negative.  Negative for cough, hemoptysis and shortness of breath.   Cardiovascular:  Positive for chest pain. Negative for leg swelling.  Gastrointestinal: Negative.  Negative for abdominal pain.  Genitourinary: Negative.  Negative for dysuria.  Musculoskeletal: Negative.  Negative for back pain.  Skin: Negative.  Negative for rash.  Neurological: Negative.  Negative for dizziness, focal weakness, weakness and headaches.  Psychiatric/Behavioral:  The patient is nervous/anxious.     As per HPI. Otherwise, a complete review of systems is negative.  PAST MEDICAL HISTORY: Past Medical History:  Diagnosis Date   COPD (chronic obstructive pulmonary disease) (HCC)    mild   COVID-19 09/2019   Depression    Erectile dysfunction    Headache    daily, stress headaches   Knee pain, left     Seasonal allergies    Stroke (HCC)    Tobacco dependence     PAST SURGICAL HISTORY: Past Surgical History:  Procedure Laterality Date   BACK SURGERY     metal plate in back   COLONOSCOPY     COLONOSCOPY WITH PROPOFOL N/A 04/26/2019   Procedure: COLONOSCOPY WITH PROPOFOL;  Surgeon: Wyline Mood, MD;  Location[facility] END[facility];  Service: Gastroenterology;  Laterality: N/A;   FINE NEEDLE ASPIRATION  03/03/2023   Procedure: FINE NEEDLE ASPIRATION;  Surgeon: Raechel Chute, MD;  Location: [facility] ENDOSCOPY;  Service: Pulmonary;;   HERNIA REPAIR Right    IR ANGIO INTRA EXTRACRAN SEL COM CAROTID INNOMINATE UNI L MOD SED  03/27/2020   IR ANGIO VERTEBRAL SEL SUBCLAVIAN INNOMINATE UNI L MOD SED  03/27/2020   IR ANGIO VERTEBRAL SEL VERTEBRAL UNI R MOD SED  03/27/2020   IR CT HEAD LTD  03/27/2020   IR PERCUTANEOUS ART THROMBECTOMY/INFUSION INTRACRANIAL INC DIAG ANGIO  03/27/2020   KNEE ARTHROSCOPY Left 03/28/2015   Procedure: Arthroscopic partial medial meniscectomy plus chondral debridement;  Surgeon: Erin Sons, MD;  Location: [facility];  Service: Orthopedics;  Laterality: Left;   RADIOLOGY WITH ANESTHESIA N/A 03/27/2020   Procedure: IR WITH ANESTHESIA;  Surgeon: Julieanne Cotton, MD;  Location: [facility] OR;  Service: Radiology;  Laterality: N/A;   VIDEO BRONCHOSCOPY WITH ENDOBRONCHIAL ULTRASOUND  03/03/2023   Procedure: VIDEO BRONCHOSCOPY WITH ENDOBRONCHIAL ULTRASOUND;  Surgeon: Raechel Chute, MD;  Location: [facility] ENDOSCOPY;  Service: Pulmonary;;    FAMILY HISTORY: Family History  Problem Relation Age of Onset   Diabetes Mother    Heart attack Mother  104   Cancer Father        liver    COPD Brother    HIV/AIDS Brother    Prostate cancer Neg Hx    Colon cancer Neg Hx     ADVANCED DIRECTIVES (Y/N):  N  HEALTH MAINTENANCE: Social History   Tobacco Use   Smoking status: Former    Packs/day: 1.50    Years: 49.00    Additional pack years: 0.00    Total pack years: 73.50    Types:  Cigarettes    Start date: 21    Quit date: 09/14/2017    Years since quitting: 5.5   Smokeless tobacco: Former  Building services engineer Use: Never used  Substance Use Topics   Alcohol use: Not Currently    Comment: 6 pack on weekend   Drug use: No     Colonoscopy:  PAP:  Bone density:  Lipid panel:  No Known Allergies  Current Outpatient Medications  Medication Sig Dispense Refill   albuterol (VENTOLIN HFA) 108 (90 Base) MCG/ACT inhaler INHALE 1 TO 2 PUFFS EVERY 4 HOURS AS NEEDED FOR WHEEZING OR SHORTNESS OF BREATH 1 each 2   aspirin EC 325 MG EC tablet Take 1 tablet (325 mg total) by mouth daily. 90 tablet 0   b complex vitamins capsule Take 1 capsule by mouth daily.     Cholecalciferol (VITAMIN D) 50 MCG (2000 UT) tablet Take 2,000 Units by mouth daily.     diphenhydrAMINE (BENADRYL) 50 MG tablet Take 50-100 mg by mouth at bedtime as needed for sleep.     fludrocortisone (FLORINEF) 0.1 MG tablet Take 0.1 mg by mouth daily.     fluticasone (FLONASE) 50 MCG/ACT nasal spray Place 2 sprays into both nostrils daily. (Patient taking differently: Place 2 sprays into both nostrils daily as needed for allergies.) 48 g 3   HYDROcodone-acetaminophen (NORCO/VICODIN) 5-325 MG tablet Take 1 tablet by mouth every 6 (six) hours as needed for moderate pain. 60 tablet 0   loratadine (CLARITIN) 10 MG tablet Take 1 tablet (10 mg total) by mouth daily. Use for 4-6 weeks then stop, and use as needed or seasonally (Patient taking differently: Take 10 mg by mouth daily as needed for allergies.) 30 tablet 11   melatonin 5 MG TABS Take 5 mg by mouth at bedtime as needed (sleep).     midodrine (PROAMATINE) 5 MG tablet Take 10 mg by mouth 3 (three) times daily with meals.     Multiple Vitamin (MULTIVITAMIN WITH MINERALS) TABS tablet Take 1 tablet by mouth daily.     Multiple Vitamins-Minerals (IMMUNE SUPPORT VITAMIN C PO) Take 1 tablet by mouth daily.     OVER THE COUNTER MEDICATION Take 5 mLs by mouth every  other day. [employer] vegan liquid iron     rosuvastatin (CRESTOR) 40 MG tablet Take 1 tablet (40 mg total) by mouth every other day. 45 tablet 3   sildenafil (REVATIO) 20 MG tablet TAKE 1 TO 5 TABLETS BY MOUTH ABOUT 30 MINUTES PRIOR TO SEX.START WITH 1 THEN INCREASE 30 tablet 5   STIOLTO RESPIMAT 2.5-2.5 MCG/ACT AERS INHALE 2 PUFFS INTO THE LUNGS DAILY. 12 g 3   cyclobenzaprine (FLEXERIL) 10 MG tablet Take 1 tablet (10 mg total) by mouth 3 (three) times daily as needed for muscle spasms. (Patient not taking: Reported on 03/02/2023) 30 tablet 0   methocarbamol (ROBAXIN) 500 MG tablet Take 1 tablet (500 mg total) by mouth 4 (four) times daily. (Patient  not taking: Reported on 03/02/2023) 20 tablet 0   naproxen (NAPROSYN) 500 MG tablet Take 1 tablet (500 mg total) by mouth 2 (two) times daily with a meal. (Patient not taking: Reported on 03/02/2023) 10 tablet 0   Oxycodone HCl 10 MG TABS Take 1 tablet (10 mg total) by mouth every 6 (six) hours as needed. 60 tablet 0   No current facility-administered medications for this visit.    OBJECTIVE: Vitals:   03/15/23 0900  BP: 101/86  Pulse: 94  Resp: 20  Temp: 97.6 F (36.4 C)  SpO2: 100%     Body mass index is 19.04 kg/m.    ECOG FS:1 - Symptomatic but completely ambulatory  General: Well-developed, well-nourished, no acute distress. Eyes: Pink conjunctiva, anicteric sclera. HEENT: Normocephalic, moist mucous membranes. Lungs: No audible wheezing or coughing. Heart: Regular rate and rhythm. Abdomen: Soft, nontender, no obvious distention. Musculoskeletal: No edema, cyanosis, or clubbing. Neuro: Alert, answering all questions appropriately. Cranial nerves grossly intact. Skin: No rashes or petechiae noted. Psych: Normal affect.  LAB RESULTS:  Lab Results  Component Value Date   NA 135 02/05/2023   K 3.9 02/05/2023   CL 105 02/05/2023   CO2 22 02/05/2023   GLUCOSE 119 (H) 02/05/2023   BUN 14 02/05/2023   CREATININE 0.99 02/05/2023    CALCIUM 9.3 02/05/2023   PROT 6.4 12/22/2022   ALBUMIN 3.0 (L) 04/02/2020   AST 22 12/22/2022   ALT 16 12/22/2022   ALKPHOS 68 04/02/2020   BILITOT 0.5 12/22/2022   GFRNONAA >60 02/05/2023   GFRAA 85 12/22/2020    Lab Results  Component Value Date   WBC 15.0 (H) 02/05/2023   NEUTROABS 10,207 (H) 12/22/2022   HGB 12.1 (L) 02/05/2023   HCT 39.7 02/05/2023   MCV 93.4 02/05/2023   PLT 407 (H) 02/05/2023     STUDIES: CT Super D Chest Wo Contrast  Result Date: 03/07/2023 CLINICAL DATA:  Right lower lobe mass.  * Tracking Code: BO * EXAM: CT CHEST WITHOUT CONTRAST TECHNIQUE: Multidetector CT imaging of the chest was performed using thin slice collimation for electromagnetic bronchoscopy planning purposes, without intravenous contrast. RADIATION DOSE REDUCTION: This exam was performed according to the departmental dose-optimization program which includes automated exposure control, adjustment of the mA and/or kV according to patient size and/or use of iterative reconstruction technique. COMPARISON:  PET 02/14/2023, MR brain 02/11/2023, CT chest abdomen pelvis 02/05/2023. FINDINGS: Cardiovascular: Atherosclerotic calcification of the aorta and coronary arteries. Heart size normal. No pericardial effusion. Mediastinum/Nodes: No pathologically enlarged mediastinal or axillary lymph nodes. Hilar regions are difficult to definitively evaluate without IV contrast but there does appear to be some right hilar enlargement, suggesting adenopathy. Esophagus is grossly unremarkable. Lungs/Pleura: Centrilobular and paraseptal emphysema. Large posterior segment right upper lobe mass crosses the major fissure into the right lower lobe, measuring 6.7 x 8.4 cm, increased in size from 5.8 x 7.8 cm on 02/05/2023. It has a long border of contact with the adjacent chest wall and obliterates the adjacent extrapleural fat (e.g. 2/77). Scattered linear scarring in the lung bases. No pleural fluid. Debris is seen in the  airway. Upper Abdomen: Visualized portion of the liver is grossly unremarkable. Stones in the gallbladder. Low-attenuation thickening of the right adrenal gland, unchanged and not hypermetabolic on 02/14/2023. No specific follow-up necessary. Adrenal glands and right kidney are otherwise unremarkable. Punctate stone in the left kidney. Visualized portions of the spleen, pancreas, stomach and bowel are grossly unremarkable. No upper abdominal adenopathy. Musculoskeletal:  Degenerative changes in the spine. No worrisome lytic or sclerotic lesions. IMPRESSION: 1. Enlarging right upper lobe primary bronchogenic carcinoma with extension into the right lower lobe and probable developing right hilar adenopathy. Long border of contact with the adjacent chest wall with extrapleural fat obliteration, highly suggestive of chest wall invasion. 2. Cholelithiasis. 3. Punctate left renal stone. 4. Aortic atherosclerosis (ICD10-I70.0). Coronary artery calcification. 5.  Emphysema (ICD10-J43.9). Electronically Signed   By: Leanna Battles M.D.   On: 03/07/2023 09:08   NM PET Image Initial (PI) Skull Base To Thigh  Result Date: 02/19/2023 CLINICAL DATA:  Initial treatment strategy for right lung mass. EXAM: NUCLEAR MEDICINE PET SKULL BASE TO THIGH TECHNIQUE: 8.2 mCi F-18 FDG was injected intravenously. Full-ring PET imaging was performed from the skull base to thigh after the radiotracer. CT data was obtained and used for attenuation correction and anatomic localization. Fasting blood glucose: 76 mg/dl COMPARISON:  6 CT chest abdomen pelvis dated 02/05/2023 FINDINGS: Mediastinal blood pool activity: SUV max 2.0 Liver activity: SUV max NA NECK: No hypermetabolic cervical lymphadenopathy. Incidental CT findings: None. CHEST: 7.8 x 6.6 cm mass in the posteromedial right upper lobe (series 4/image 61), max SUV 18.0. 4 mm short axis high right paratracheal node (series 4/image 4) and 6 mm short axis low right paratracheal node (series  4/image 64), max SUV 2.3, likely reactive. Incidental CT findings: Atherosclerotic calcifications of the aortic arch. Mild coronary atherosclerosis of the LAD. ABDOMEN/PELVIS: No abnormal hypermetabolism in the liver, spleen, pancreas, or adrenal glands. Specifically, no findings suspicious for right adrenal metastasis on PET. No hypermetabolic abdominopelvic lymphadenopathy. Incidental CT findings: Cholelithiasis, without associated inflammatory changes. 3 mm nonobstructing left lower pole renal calculus. Atherosclerotic calcifications abdominal aorta and branch vessels. High riding right testis in the distal right inguinal canal (series 4/image 165). SKELETON: No focal hypermetabolic activity to suggest skeletal metastasis. Incidental CT findings: Degenerative changes of the visualized thoracolumbar spine. Postsurgical changes in the lower lumbar spine. IMPRESSION: 7.8 cm right upper lobe mass, compatible with primary bronchogenic carcinoma. No findings suspicious for metastatic disease. Electronically Signed   By: Charline Bills M.D.   On: 02/19/2023 23:54    ASSESSMENT: Stage IIIa adenocarcinoma of the right upper lobe lung.  PLAN:    Stage IIIa adenocarcinoma of the right upper lobe lung: Pathology and imaging reviewed independently confirming diagnosis and stage of disease.  PET scan results from February 14, 2023 reviewed independently and report as above with no obvious evidence of metastatic disease.  MRI of the brain from February 11, 2023 also negative for metastatic disease.  Patient was given a referral to thoracic surgery for evaluation and also has PFTs scheduled for tomorrow.  If surgery is not an option, patient would benefit from concurrent chemotherapy and XRT using weekly carboplatinum and Taxol along with daily XRT.  This will be followed by maintenance durvalumab every 2 weeks for 1 year.  Patient was given a referral to radiation oncology today.  He will also require port placement in the near  future.  Follow-up will be based on PFT results and surgical evaluation.  Chest pain: Secondary to malignancy.  Patient was given a prescription for 10 mg oxycodone as needed.  Iron deficiency anemia: Patient's hemoglobin is only mildly decreased at 12.1, but he was noted to have reduced iron stores.  Can consider IV iron in the near future. Leukocytosis: Likely reactive, monitor. Weight loss: Patient was given a referral to dietary today.  Patient expressed understanding and was in  agreement with this plan. He also understands that He can call clinic at any time with any questions, concerns, or complaints.    Cancer Staging  Adenocarcinoma of upper lobe of right lung [facility]) Staging form: Lung, AJCC 8th Edition - Clinical stage from 03/15/2023: Stage IIIA (cT4, cN0, cM0) - Signed by Jeralyn Ruths, MD on 03/15/2023 Stage prefix: Initial diagnosis   Jeralyn Ruths, MD   03/15/2023 1:00 PM

## 2023-03-16 ENCOUNTER — Ambulatory Visit (HOSPITAL_COMMUNITY)
Admission: RE | Admit: 2023-03-16 | Discharge: 2023-03-16 | Disposition: A | Discharge status: Home / Self Care | Payer: Medicare HMO | Source: Ambulatory Visit | Attending: Thoracic Surgery (Cardiothoracic Vascular Surgery) | Admitting: Thoracic Surgery (Cardiothoracic Vascular Surgery)

## 2023-03-16 ENCOUNTER — Encounter: Payer: Self-pay | Admitting: *Deleted

## 2023-03-16 ENCOUNTER — Other Ambulatory Visit: Payer: Self-pay

## 2023-03-16 DIAGNOSIS — R918 Other nonspecific abnormal finding of lung field: Secondary | ICD-10-CM | POA: Insufficient documentation

## 2023-03-16 LAB — PULMONARY FUNCTION TEST
DL/VA % pred: 55 %
DL/VA: 2.25 ml/min/mmHg/L
DLCO unc % pred: 57 %
DLCO unc: 16.09 ml/min/mmHg
FEF 25-75 Post: 1.25 L/sec
FEF 25-75 Pre: 0.76 L/sec
FEF2575-%Change-Post: 64 %
FEF2575-%Pred-Post: 45 %
FEF2575-%Pred-Pre: 27 %
FEV1-%Change-Post: 20 %
FEV1-%Pred-Post: 74 %
FEV1-%Pred-Pre: 62 %
FEV1-Post: 2.69 L
FEV1-Pre: 2.24 L
FEV1FVC-%Change-Post: 18 %
FEV1FVC-%Pred-Pre: 62 %
FEV6-%Change-Post: 11 %
FEV6-%Pred-Post: 102 %
FEV6-%Pred-Pre: 92 %
FEV6-Post: 4.72 L
FEV6-Pre: 4.23 L
FEV6FVC-%Change-Post: 10 %
FEV6FVC-%Pred-Post: 101 %
FEV6FVC-%Pred-Pre: 91 %
FVC-%Change-Post: 1 %
FVC-%Pred-Post: 101 %
FVC-%Pred-Pre: 100 %
FVC-Post: 4.92 L
FVC-Pre: 4.86 L
Post FEV1/FVC ratio: 55 %
Post FEV6/FVC ratio: 96 %
Pre FEV1/FVC ratio: 46 %
Pre FEV6/FVC Ratio: 87 %
RV % pred: 186 %
RV: 4.7 L
TLC % pred: 131 %
TLC: 9.76 L

## 2023-03-16 MED ORDER — ALBUTEROL SULFATE (2.5 MG/3ML) 0.083% IN NEBU
2.5000 mg | INHALATION_SOLUTION | Freq: Once | RESPIRATORY_TRACT | Status: AC
  Administered 2023-03-16: 2.5 mg via RESPIRATORY_TRACT

## 2023-03-16 NOTE — Progress Notes (Signed)
Request for [facility] faxed to [facility] on recent lung specimen [phone])

## 2023-03-17 ENCOUNTER — Telehealth: Payer: Self-pay

## 2023-03-17 ENCOUNTER — Encounter: Payer: Self-pay | Admitting: *Deleted

## 2023-03-17 ENCOUNTER — Other Ambulatory Visit: Payer: Self-pay

## 2023-03-17 NOTE — Telephone Encounter (Signed)
Called patient to get port insertion scheduled 7/23. No answer, please try again and let Marylu Lund know

## 2023-03-21 ENCOUNTER — Other Ambulatory Visit: Payer: Self-pay | Admitting: Oncology

## 2023-03-23 ENCOUNTER — Encounter: Payer: Self-pay | Admitting: Radiation Oncology

## 2023-03-23 ENCOUNTER — Encounter: Payer: Self-pay | Admitting: *Deleted

## 2023-03-23 ENCOUNTER — Ambulatory Visit
Admission: RE | Admit: 2023-03-23 | Discharge: 2023-03-23 | Disposition: A | Discharge status: Home / Self Care | Payer: Medicare HMO | Source: Ambulatory Visit | Attending: Radiation Oncology | Admitting: Radiation Oncology

## 2023-03-23 VITALS — BP 92/63 | HR 88 | Temp 97.0°F | Resp 16 | Wt 139.0 lb

## 2023-03-23 DIAGNOSIS — N200 Calculus of kidney: Secondary | ICD-10-CM | POA: Insufficient documentation

## 2023-03-23 DIAGNOSIS — Z8616 Personal history of COVID-19: Secondary | ICD-10-CM | POA: Insufficient documentation

## 2023-03-23 DIAGNOSIS — G893 Neoplasm related pain (acute) (chronic): Secondary | ICD-10-CM | POA: Insufficient documentation

## 2023-03-23 DIAGNOSIS — Z7952 Long term (current) use of systemic steroids: Secondary | ICD-10-CM | POA: Insufficient documentation

## 2023-03-23 DIAGNOSIS — Z7982 Long term (current) use of aspirin: Secondary | ICD-10-CM | POA: Insufficient documentation

## 2023-03-23 DIAGNOSIS — Z791 Long term (current) use of non-steroidal anti-inflammatories (NSAID): Secondary | ICD-10-CM | POA: Insufficient documentation

## 2023-03-23 DIAGNOSIS — J432 Centrilobular emphysema: Secondary | ICD-10-CM | POA: Insufficient documentation

## 2023-03-23 DIAGNOSIS — I700 Atherosclerosis of aorta: Secondary | ICD-10-CM | POA: Insufficient documentation

## 2023-03-23 DIAGNOSIS — I2510 Atherosclerotic heart disease of native coronary artery without angina pectoris: Secondary | ICD-10-CM | POA: Insufficient documentation

## 2023-03-23 DIAGNOSIS — D72829 Elevated white blood cell count, unspecified: Secondary | ICD-10-CM | POA: Insufficient documentation

## 2023-03-23 DIAGNOSIS — Z510 Encounter for antineoplastic radiation therapy: Secondary | ICD-10-CM | POA: Insufficient documentation

## 2023-03-23 DIAGNOSIS — Z87891 Personal history of nicotine dependence: Secondary | ICD-10-CM | POA: Insufficient documentation

## 2023-03-23 DIAGNOSIS — R079 Chest pain, unspecified: Secondary | ICD-10-CM | POA: Insufficient documentation

## 2023-03-23 DIAGNOSIS — J449 Chronic obstructive pulmonary disease, unspecified: Secondary | ICD-10-CM | POA: Insufficient documentation

## 2023-03-23 DIAGNOSIS — R918 Other nonspecific abnormal finding of lung field: Secondary | ICD-10-CM | POA: Insufficient documentation

## 2023-03-23 DIAGNOSIS — K8020 Calculus of gallbladder without cholecystitis without obstruction: Secondary | ICD-10-CM | POA: Insufficient documentation

## 2023-03-23 DIAGNOSIS — Z79899 Other long term (current) drug therapy: Secondary | ICD-10-CM | POA: Insufficient documentation

## 2023-03-23 DIAGNOSIS — Z5111 Encounter for antineoplastic chemotherapy: Secondary | ICD-10-CM | POA: Insufficient documentation

## 2023-03-23 DIAGNOSIS — C3411 Malignant neoplasm of upper lobe, right bronchus or lung: Secondary | ICD-10-CM | POA: Insufficient documentation

## 2023-03-23 DIAGNOSIS — R531 Weakness: Secondary | ICD-10-CM | POA: Insufficient documentation

## 2023-03-23 DIAGNOSIS — R634 Abnormal weight loss: Secondary | ICD-10-CM | POA: Insufficient documentation

## 2023-03-23 DIAGNOSIS — Z8673 Personal history of transient ischemic attack (TIA), and cerebral infarction without residual deficits: Secondary | ICD-10-CM | POA: Insufficient documentation

## 2023-03-23 DIAGNOSIS — Z800 Family history of malignant neoplasm of digestive organs: Secondary | ICD-10-CM | POA: Insufficient documentation

## 2023-03-23 DIAGNOSIS — R42 Dizziness and giddiness: Secondary | ICD-10-CM | POA: Insufficient documentation

## 2023-03-23 DIAGNOSIS — D509 Iron deficiency anemia, unspecified: Secondary | ICD-10-CM | POA: Insufficient documentation

## 2023-03-23 NOTE — Progress Notes (Signed)
Met with patient during consult with Dr. Rushie Chestnut. All questions answered during visit. Reviewed upcoming appts with pt and his wife. Informed that will await surgical evaluation on Friday 7/19 then schedule chemo/radiation treatments if not a surgical candidate. Pt verbalized understanding. Nothing further needed at this time.

## 2023-03-23 NOTE — Consult Note (Signed)
NEW PATIENT EVALUATION  Name: Ethan Fitzgerald  MRN: [id number]  Date:   03/23/2023     DOB: [birth year]   This 67 y.o. male patient presents to the clinic for initial evaluation of stage IIIa (T4 N1 M0) adenocarcinoma of the right upper lobe.Marland Kitchen  REFERRING PHYSICIAN: Saralyn Pilar *  CHIEF COMPLAINT:  Chief Complaint  Patient presents with   Lung Cancer    DIAGNOSIS: Stage IIIa right upper lobe adenocarcinoma   PREVIOUS INVESTIGATIONS:  CT scan PET CT scans MRI brain all reviewed Cytology and pathology reports reviewed Clinical notes reviewed  HPI: Patient is a 67 year old male presented with dizziness and weakness and some abnormal chest pain.  He was found to have a right upper lobe lesion approximately 8 cm.  Tumor involve the major fissure and superior segment the right lower lobe.  There is also probably mediastinal involvement also noted right paraspinal region.  PET CT scan showed a 7.8 cm right upper lobe mass hypermetabolic.  No evidence of mediastinal adenopathy was noted.  Repeat CT scan in June showed probable right hilar adenopathy.  He underwent navigational bronchoscopy which was positive for adenocarcinoma multiple lymph nodes were sampled all negative for metastatic disease.  Patient has significant COPD and is status post CVA.  He has significant weight loss over the past several months.  He specifically denies cough hemoptysis.  He is complaining of continued chest pain.  His tumor does seem to invade the chest wall.  PLANNED TREATMENT REGIMEN: Concurrent chemoradiation therapy  PAST MEDICAL HISTORY:  has a past medical history of COPD (chronic obstructive pulmonary disease) (HCC), COVID-19 (09/2019), Depression, Erectile dysfunction, Headache, Knee pain, left, Seasonal allergies, Stroke (HCC), and Tobacco dependence.    PAST SURGICAL HISTORY:  Past Surgical History:  Procedure Laterality Date   BACK SURGERY     metal plate in back   COLONOSCOPY      COLONOSCOPY WITH PROPOFOL N/A 04/26/2019   Procedure: COLONOSCOPY WITH PROPOFOL;  Surgeon: Wyline Mood, MD;  Location: [facility] ENDOSCOPY;  Service: Gastroenterology;  Laterality: N/A;   FINE NEEDLE ASPIRATION  03/03/2023   Procedure: FINE NEEDLE ASPIRATION;  Surgeon: Raechel Chute, MD;  Location: [facility] ENDOSCOPY;  Service: Pulmonary;;   HERNIA REPAIR Right    IR ANGIO INTRA EXTRACRAN SEL COM CAROTID INNOMINATE UNI L MOD SED  03/27/2020   IR ANGIO VERTEBRAL SEL SUBCLAVIAN INNOMINATE UNI L MOD SED  03/27/2020   IR ANGIO VERTEBRAL SEL VERTEBRAL UNI R MOD SED  03/27/2020   IR CT HEAD LTD  03/27/2020   IR PERCUTANEOUS ART THROMBECTOMY/INFUSION INTRACRANIAL INC DIAG ANGIO  03/27/2020   KNEE ARTHROSCOPY Left 03/28/2015   Procedure: Arthroscopic partial medial meniscectomy plus chondral debridement;  Surgeon: Erin Sons, MD;  Location: [facility] SURGERY CNTR;  Service: Orthopedics;  Laterality: Left;   RADIOLOGY WITH ANESTHESIA N/A 03/27/2020   Procedure: IR WITH ANESTHESIA;  Surgeon: Julieanne Cotton, MD;  Location: [facility] OR;  Service: Radiology;  Laterality: N/A;   VIDEO BRONCHOSCOPY WITH ENDOBRONCHIAL ULTRASOUND  03/03/2023   Procedure: VIDEO BRONCHOSCOPY WITH ENDOBRONCHIAL ULTRASOUND;  Surgeon: Raechel Chute, MD;  Location: [facility] ENDOSCOPY;  Service: Pulmonary;;    FAMILY HISTORY: family history includes COPD in his brother; Cancer in his father; Diabetes in his mother; HIV/AIDS in his brother; Heart attack (age of onset: 86) in his mother.  SOCIAL HISTORY:  reports that he quit smoking about 5 years ago. His smoking use included cigarettes. He started smoking about 54 years ago. He has a 73.5 pack-year smoking  history. He has quit using smokeless tobacco. He reports that he does not currently use alcohol. He reports that he does not use drugs.  ALLERGIES: Patient has no known allergies.  MEDICATIONS:  Current Outpatient Medications  Medication Sig Dispense Refill   albuterol (VENTOLIN HFA) 108 (90 Base)  MCG/ACT inhaler INHALE 1 TO 2 PUFFS EVERY 4 HOURS AS NEEDED FOR WHEEZING OR SHORTNESS OF BREATH 1 each 2   aspirin EC 325 MG EC tablet Take 1 tablet (325 mg total) by mouth daily. 90 tablet 0   b complex vitamins capsule Take 1 capsule by mouth daily.     Cholecalciferol (VITAMIN D) 50 MCG (2000 UT) tablet Take 2,000 Units by mouth daily.     fludrocortisone (FLORINEF) 0.1 MG tablet Take 0.1 mg by mouth daily.     fluticasone (FLONASE) 50 MCG/ACT nasal spray Place 2 sprays into both nostrils daily. (Patient taking differently: Place 2 sprays into both nostrils daily as needed for allergies.) 48 g 3   HYDROcodone-acetaminophen (NORCO/VICODIN) 5-325 MG tablet Take 1 tablet by mouth every 6 (six) hours as needed for moderate pain. 60 tablet 0   loratadine (CLARITIN) 10 MG tablet Take 1 tablet (10 mg total) by mouth daily. Use for 4-6 weeks then stop, and use as needed or seasonally (Patient taking differently: Take 10 mg by mouth daily as needed for allergies.) 30 tablet 11   melatonin 5 MG TABS Take 5 mg by mouth at bedtime as needed (sleep).     methocarbamol (ROBAXIN) 500 MG tablet Take 1 tablet (500 mg total) by mouth 4 (four) times daily. 20 tablet 0   midodrine (PROAMATINE) 5 MG tablet Take 10 mg by mouth 3 (three) times daily with meals.     Multiple Vitamin (MULTIVITAMIN WITH MINERALS) TABS tablet Take 1 tablet by mouth daily.     Multiple Vitamins-Minerals (IMMUNE SUPPORT VITAMIN C PO) Take 1 tablet by mouth daily.     naproxen (NAPROSYN) 500 MG tablet Take 1 tablet (500 mg total) by mouth 2 (two) times daily with a meal. 10 tablet 0   OVER THE COUNTER MEDICATION Take 5 mLs by mouth every other day. Mary [employer]'s vegan liquid iron     Oxycodone HCl 10 MG TABS Take 1 tablet (10 mg total) by mouth every 6 (six) hours as needed. 60 tablet 0   rosuvastatin (CRESTOR) 40 MG tablet Take 1 tablet (40 mg total) by mouth every other day. 45 tablet 3   sildenafil (REVATIO) 20 MG tablet TAKE 1 TO 5 TABLETS  BY MOUTH ABOUT 30 MINUTES PRIOR TO SEX.START WITH 1 THEN INCREASE 30 tablet 5   STIOLTO RESPIMAT 2.5-2.5 MCG/ACT AERS INHALE 2 PUFFS INTO THE LUNGS DAILY. 12 g 3   diphenhydrAMINE (BENADRYL) 50 MG tablet Take 50-100 mg by mouth at bedtime as needed for sleep.     No current facility-administered medications for this encounter.    ECOG PERFORMANCE STATUS:  1 - Symptomatic but completely ambulatory  REVIEW OF SYSTEMS: Patient has a history of previous CVA COPD headaches Patient denies any weight loss, fatigue, weakness, fever, chills or night sweats. Patient denies any loss of vision, blurred vision. Patient denies any ringing  of the ears or hearing loss. No irregular heartbeat. Patient denies heart murmur or history of fainting. Patient denies any chest pain or pain radiating to her upper extremities. Patient denies any shortness of breath, difficulty breathing at night, cough or hemoptysis. Patient denies any swelling in the lower legs. Patient  denies any nausea vomiting, vomiting of blood, or coffee ground material in the vomitus. Patient denies any stomach pain. Patient states has had normal bowel movements no significant constipation or diarrhea. Patient denies any dysuria, hematuria or significant nocturia. Patient denies any problems walking, swelling in the joints or loss of balance. Patient denies any skin changes, loss of hair or loss of weight. Patient denies any excessive worrying or anxiety or significant depression. Patient denies any problems with insomnia. Patient denies excessive thirst, polyuria, polydipsia. Patient denies any swollen glands, patient denies easy bruising or easy bleeding. Patient denies any recent infections, allergies or URI. Patient "s visual fields have not changed significantly in recent time.  MRI of brain showed no evidence of metastatic disease.   PHYSICAL EXAM: BP 92/63   Pulse 88   Temp (!) 97 F (36.1 C) (Tympanic)   Resp 16   Wt 139 lb (63 kg)   BMI  18.85 kg/m  Thin cachectic male in NAD.  Well-developed well-nourished patient in NAD. HEENT reveals PERLA, EOMI, discs not visualized.  Oral cavity is clear. No oral mucosal lesions are identified. Neck is clear without evidence of cervical or supraclavicular adenopathy. Lungs are clear to A&P. Cardiac examination is essentially unremarkable with regular rate and rhythm without murmur rub or thrill. Abdomen is benign with no organomegaly or masses noted. Motor sensory and DTR levels are equal and symmetric in the upper and lower extremities. Cranial nerves II through XII are grossly intact. Proprioception is intact. No peripheral adenopathy or edema is identified. No motor or sensory levels are noted. Crude visual fields are within normal range.  LABORATORY DATA: Cytology and pathology report reviewed compatible with above-stated findings    RADIOLOGY RESULTS: CT scans MRI scans of brain and PET scan all reviewed compatible with above-stated findings   IMPRESSION: Stage IIIa adenocarcinoma of the right upper lobe in 67 year old male  PLAN: This time patient is a consult this week for thoracic surgery Dr. Cliffton Asters.  Based on chest wall invasion invasion COPD will recommend concurrent chemoradiation therapy.  Will plan on delivering 70 Gray over 7 weeks with concurrent weekly chemotherapy.  Risks and benefits of radiation including development of a cough skin reaction fatigue alteration of blood counts all were discussed in detail with the patient.  Patient comprehends my recommendations well.  There will be extra effort by both professional staff as well as technical staff to coordinate and manage concurrent chemoradiation and ensuing side effects during his treatments. I would choose IMRT based on the 3A nature of his disease with chest wall involvement to avoid critical structures such as his heart normal lung volume spinal cord.  I would like to take this opportunity to thank you for allowing me  to participate in the care of your patient.Carmina Miller, MD

## 2023-03-24 ENCOUNTER — Other Ambulatory Visit: Payer: Self-pay | Admitting: Oncology

## 2023-03-24 DIAGNOSIS — C3411 Malignant neoplasm of upper lobe, right bronchus or lung: Secondary | ICD-10-CM | POA: Diagnosis not present

## 2023-03-24 DIAGNOSIS — Z87891 Personal history of nicotine dependence: Secondary | ICD-10-CM | POA: Diagnosis not present

## 2023-03-24 MED ORDER — LIDOCAINE-PRILOCAINE 2.5-2.5 % EX CREA
TOPICAL_CREAM | CUTANEOUS | 3 refills | Status: DC
Start: 2023-03-24 — End: 2023-05-25

## 2023-03-24 MED ORDER — PROCHLORPERAZINE MALEATE 10 MG PO TABS
10.0000 mg | ORAL_TABLET | Freq: Four times a day (QID) | ORAL | 2 refills | Status: DC | PRN
Start: 2023-03-24 — End: 2023-05-25

## 2023-03-24 MED ORDER — ONDANSETRON HCL 8 MG PO TABS
8.0000 mg | ORAL_TABLET | Freq: Three times a day (TID) | ORAL | 2 refills | Status: DC | PRN
Start: 2023-03-24 — End: 2023-05-25

## 2023-03-25 ENCOUNTER — Encounter: Payer: Self-pay | Admitting: Thoracic Surgery (Cardiothoracic Vascular Surgery)

## 2023-03-25 ENCOUNTER — Institutional Professional Consult (permissible substitution): Payer: Medicare HMO | Admitting: Thoracic Surgery (Cardiothoracic Vascular Surgery)

## 2023-03-25 VITALS — BP 103/70 | HR 87 | Resp 20 | Ht 72.0 in | Wt 139.4 lb

## 2023-03-25 DIAGNOSIS — C3411 Malignant neoplasm of upper lobe, right bronchus or lung: Secondary | ICD-10-CM

## 2023-03-25 NOTE — Progress Notes (Unsigned)
[address]       [place] [address]             [phone]                    Ethan Fitzgerald [place] Medical Record #[id number] Date of Birth: September 12, 1955  Referring: Raechel Chute, MD Primary Care: Smitty Cords, DO Primary Cardiologist: None  Chief Complaint:    Chief Complaint  Patient presents with   Lung Mass    New patient consultation, review all studies     History of Present Illness:    Ethan Fitzgerald 67 y.o. male presents for surgical evaluation of a right lower lobe adenocarcinoma.  He has had significant weight loss since the beginning of the year along with lower back pain.         Past Medical History:  Diagnosis Date   COPD (chronic obstructive pulmonary disease) (HCC)    mild   COVID-19 09/2019   Depression    Erectile dysfunction    Headache    daily, stress headaches   Knee pain, left    Seasonal allergies    Stroke [facility])    Tobacco dependence     Past Surgical History:  Procedure Laterality Date   BACK SURGERY     metal plate in back   COLONOSCOPY     COLONOSCOPY WITH PROPOFOL N/A 04/26/2019   Procedure: COLONOSCOPY WITH PROPOFOL;  Surgeon: Wyline Mood, MD;  Location: [facility] ENDOSCOPY;  Service: Gastroenterology;  Laterality: N/A;   FINE NEEDLE ASPIRATION  03/03/2023   Procedure: FINE NEEDLE ASPIRATION;  Surgeon: Raechel Chute, MD;  Location: [facility] ENDOSCOPY;  Service: Pulmonary;;   HERNIA REPAIR Right    IR ANGIO INTRA EXTRACRAN SEL COM CAROTID INNOMINATE UNI L MOD SED  03/27/2020   IR ANGIO VERTEBRAL SEL SUBCLAVIAN INNOMINATE UNI L MOD SED  03/27/2020   IR ANGIO VERTEBRAL SEL VERTEBRAL UNI R MOD SED  03/27/2020   IR CT HEAD LTD  03/27/2020   IR PERCUTANEOUS ART THROMBECTOMY/INFUSION INTRACRANIAL INC DIAG ANGIO  03/27/2020   KNEE ARTHROSCOPY Left 03/28/2015   Procedure: Arthroscopic partial medial meniscectomy plus chondral debridement;  Surgeon: Erin Sons, MD;  Location: [facility] SURGERY CNTR;  Service:  Orthopedics;  Laterality: Left;   RADIOLOGY WITH ANESTHESIA N/A 03/27/2020   Procedure: IR WITH ANESTHESIA;  Surgeon: Julieanne Cotton, MD;  Location: [facility] OR;  Service: Radiology;  Laterality: N/A;   VIDEO BRONCHOSCOPY WITH ENDOBRONCHIAL ULTRASOUND  03/03/2023   Procedure: VIDEO BRONCHOSCOPY WITH ENDOBRONCHIAL ULTRASOUND;  Surgeon: Raechel Chute, MD;  Location: [facility] ENDOSCOPY;  Service: Pulmonary;;    Family History  Problem Relation Age of Onset   Diabetes Mother    Heart attack Mother 94   Cancer Father        liver    COPD Brother    HIV/AIDS Brother    Prostate cancer Neg Hx    Colon cancer Neg Hx      Social History   Tobacco Use  Smoking Status Former   Current packs/day: 0.00   Average packs/day: 1.5 packs/day for 49.0 years (73.5 ttl pk-yrs)   Types: Cigarettes   Start date: 73   Quit date: 09/14/2017   Years since quitting: 5.5  Smokeless Tobacco Former    Social History   Substance and Sexual Activity  Alcohol Use Not Currently   Comment: 6 pack on weekend     No Known Allergies  Current Outpatient  Medications  Medication Sig Dispense Refill   albuterol (VENTOLIN HFA) 108 (90 Base) MCG/ACT inhaler INHALE 1 TO 2 PUFFS EVERY 4 HOURS AS NEEDED FOR WHEEZING OR SHORTNESS OF BREATH 1 each 2   aspirin EC 325 MG EC tablet Take 1 tablet (325 mg total) by mouth daily. 90 tablet 0   b complex vitamins capsule Take 1 capsule by mouth daily.     Cholecalciferol (VITAMIN D) 50 MCG (2000 UT) tablet Take 2,000 Units by mouth daily.     diphenhydrAMINE (BENADRYL) 50 MG tablet Take 50-100 mg by mouth at bedtime as needed for sleep.     fludrocortisone (FLORINEF) 0.1 MG tablet Take 0.1 mg by mouth daily.     fluticasone (FLONASE) 50 MCG/ACT nasal spray Place 2 sprays into both nostrils daily. (Patient taking differently: Place 2 sprays into both nostrils daily as needed for allergies.) 48 g 3   HYDROcodone-acetaminophen (NORCO/VICODIN) 5-325 MG tablet Take 1 tablet by mouth  every 6 (six) hours as needed for moderate pain. 60 tablet 0   lidocaine-prilocaine (EMLA) cream Apply to affected area once 30 g 3   loratadine (CLARITIN) 10 MG tablet Take 1 tablet (10 mg total) by mouth daily. Use for 4-6 weeks then stop, and use as needed or seasonally (Patient taking differently: Take 10 mg by mouth daily as needed for allergies.) 30 tablet 11   melatonin 5 MG TABS Take 5 mg by mouth at bedtime as needed (sleep).     methocarbamol (ROBAXIN) 500 MG tablet Take 1 tablet (500 mg total) by mouth 4 (four) times daily. 20 tablet 0   midodrine (PROAMATINE) 5 MG tablet Take 10 mg by mouth 3 (three) times daily with meals.     Multiple Vitamin (MULTIVITAMIN WITH MINERALS) TABS tablet Take 1 tablet by mouth daily.     Multiple Vitamins-Minerals (IMMUNE SUPPORT VITAMIN C PO) Take 1 tablet by mouth daily.     naproxen (NAPROSYN) 500 MG tablet Take 1 tablet (500 mg total) by mouth 2 (two) times daily with a meal. 10 tablet 0   ondansetron (ZOFRAN) 8 MG tablet Take 1 tablet (8 mg total) by mouth every 8 (eight) hours as needed for nausea or vomiting. Start on the third day after chemotherapy. 60 tablet 2   OVER THE COUNTER MEDICATION Take 5 mLs by mouth every other day. [employer] vegan liquid iron     Oxycodone HCl 10 MG TABS Take 1 tablet (10 mg total) by mouth every 6 (six) hours as needed. 60 tablet 0   prochlorperazine (COMPAZINE) 10 MG tablet Take 1 tablet (10 mg total) by mouth every 6 (six) hours as needed for nausea or vomiting. 60 tablet 2   rosuvastatin (CRESTOR) 40 MG tablet Take 1 tablet (40 mg total) by mouth every other day. 45 tablet 3   sildenafil (REVATIO) 20 MG tablet TAKE 1 TO 5 TABLETS BY MOUTH ABOUT 30 MINUTES PRIOR TO SEX.START WITH 1 THEN INCREASE 30 tablet 5   STIOLTO RESPIMAT 2.5-2.5 MCG/ACT AERS INHALE 2 PUFFS INTO THE LUNGS DAILY. 12 g 3   No current facility-administered medications for this visit.    Review of Systems  Constitutional:  Positive for  malaise/fatigue and weight loss.  Respiratory:  Positive for shortness of breath.   Musculoskeletal:  Positive for back pain and myalgias.  Neurological: Negative.      PHYSICAL EXAMINATION: BP 103/70 (BP Location: Left Arm, Patient Position: Sitting, Cuff Size: Normal)   Pulse 87   Resp  20   Ht 6' (1.829 m)   Wt 139 lb 6.4 oz (63.2 kg)   SpO2 98% Comment: RA  BMI 18.91 kg/m  Physical Exam Constitutional:      General: He is not in acute distress.    Appearance: He is ill-appearing. He is not toxic-appearing.  HENT:     Head: Normocephalic and atraumatic.  Eyes:     Extraocular Movements: Extraocular movements intact.  Cardiovascular:     Rate and Rhythm: Normal rate.  Pulmonary:     Effort: Pulmonary effort is normal. No respiratory distress.  Abdominal:     General: Abdomen is flat. There is no distension.  Musculoskeletal:        General: Normal range of motion.  Skin:    General: Skin is warm and dry.  Neurological:     Mental Status: He is alert.          I have independently reviewed the above radiology studies  and reviewed the findings with the patient.   Recent Lab Findings: Lab Results  Component Value Date   WBC 15.0 (H) 02/05/2023   HGB 12.1 (L) 02/05/2023   HCT 39.7 02/05/2023   PLT 407 (H) 02/05/2023   GLUCOSE 119 (H) 02/05/2023   CHOL 110 12/22/2022   TRIG 158 (H) 12/22/2022   HDL 33 (L) 12/22/2022   LDLCALC 53 12/22/2022   ALT 16 12/22/2022   AST 22 12/22/2022   NA 135 02/05/2023   K 3.9 02/05/2023   CL 105 02/05/2023   CREATININE 0.99 02/05/2023   BUN 14 02/05/2023   CO2 22 02/05/2023   TSH 2.969 02/05/2023   INR 1.0 03/27/2020   HGBA1C 6.1 (H) 12/22/2022    Diagnostic Studies & Laboratory data:     Recent Radiology Findings:   CT Super D Chest Wo Contrast  Result Date: 03/07/2023 CLINICAL DATA:  Right lower lobe mass.  * Tracking Code: BO * EXAM: CT CHEST WITHOUT CONTRAST TECHNIQUE: Multidetector CT imaging of the chest was  performed using thin slice collimation for electromagnetic bronchoscopy planning purposes, without intravenous contrast. RADIATION DOSE REDUCTION: This exam was performed according to the departmental dose-optimization program which includes automated exposure control, adjustment of the mA and/or kV according to patient size and/or use of iterative reconstruction technique. COMPARISON:  PET 02/14/2023, MR brain 02/11/2023, CT chest abdomen pelvis 02/05/2023. FINDINGS: Cardiovascular: Atherosclerotic calcification of the aorta and coronary arteries. Heart size normal. No pericardial effusion. Mediastinum/Nodes: No pathologically enlarged mediastinal or axillary lymph nodes. Hilar regions are difficult to definitively evaluate without IV contrast but there does appear to be some right hilar enlargement, suggesting adenopathy. Esophagus is grossly unremarkable. Lungs/Pleura: Centrilobular and paraseptal emphysema. Large posterior segment right upper lobe mass crosses the major fissure into the right lower lobe, measuring 6.7 x 8.4 cm, increased in size from 5.8 x 7.8 cm on 02/05/2023. It has a long border of contact with the adjacent chest wall and obliterates the adjacent extrapleural fat (e.g. 2/77). Scattered linear scarring in the lung bases. No pleural fluid. Debris is seen in the airway. Upper Abdomen: Visualized portion of the liver is grossly unremarkable. Stones in the gallbladder. Low-attenuation thickening of the right adrenal gland, unchanged and not hypermetabolic on 02/14/2023. No specific follow-up necessary. Adrenal glands and right kidney are otherwise unremarkable. Punctate stone in the left kidney. Visualized portions of the spleen, pancreas, stomach and bowel are grossly unremarkable. No upper abdominal adenopathy. Musculoskeletal: Degenerative changes in the spine. No worrisome lytic or sclerotic  lesions. IMPRESSION: 1. Enlarging right upper lobe primary bronchogenic carcinoma with extension into  the right lower lobe and probable developing right hilar adenopathy. Long border of contact with the adjacent chest wall with extrapleural fat obliteration, highly suggestive of chest wall invasion. 2. Cholelithiasis. 3. Punctate left renal stone. 4. Aortic atherosclerosis (ICD10-I70.0). Coronary artery calcification. 5.  Emphysema (ICD10-J43.9). Electronically Signed   By: Leanna Battles M.D.   On: 03/07/2023 09:08     PFTs:  - FVC: 100% - FEV1: 62% -DLCO: 57%    Assessment / Plan:   67yo male with 8.4cm right upper lobe adenocarcinoma.  There appears to be involvement of the right lower lobe as well.  It also is very close to the spine, and he has been having back pain with is concerning for invasion.  Additionally, it is also very close to the mediastinum, specifically the esophagus, and has have had some dysphagia.  Given my concern for mediastinal invasion, he may not be a surgical candidate on the basis of resectability.  We will review his case in tumor board, and I will touch base with him next week.    W     I  spent 40 minutes with  the patient face to face in counseling and coordination of care.    Corliss Skains 03/25/2023 4:55 PM

## 2023-03-28 ENCOUNTER — Telehealth: Payer: Self-pay | Admitting: *Deleted

## 2023-03-28 ENCOUNTER — Inpatient Hospital Stay: Payer: Medicare HMO

## 2023-03-28 ENCOUNTER — Other Ambulatory Visit: Payer: Self-pay | Admitting: Radiology

## 2023-03-28 ENCOUNTER — Other Ambulatory Visit: Payer: Self-pay

## 2023-03-28 DIAGNOSIS — C3411 Malignant neoplasm of upper lobe, right bronchus or lung: Secondary | ICD-10-CM

## 2023-03-28 NOTE — Telephone Encounter (Signed)
Call from patient wife reporting that patient fell this morning nd he could not make his appointment. She requested to reschedule his appointment which has been done

## 2023-03-29 ENCOUNTER — Ambulatory Visit
Admission: RE | Admit: 2023-03-29 | Discharge: 2023-03-29 | Disposition: A | Discharge status: Home / Self Care | Payer: Medicare HMO | Source: Ambulatory Visit | Attending: Oncology | Admitting: Oncology

## 2023-03-29 ENCOUNTER — Other Ambulatory Visit: Payer: Self-pay | Admitting: Oncology

## 2023-03-29 ENCOUNTER — Inpatient Hospital Stay: Payer: Medicare HMO

## 2023-03-29 ENCOUNTER — Ambulatory Visit: Payer: Medicare HMO

## 2023-03-29 ENCOUNTER — Encounter: Payer: Self-pay | Admitting: Radiology

## 2023-03-29 DIAGNOSIS — Z5111 Encounter for antineoplastic chemotherapy: Secondary | ICD-10-CM | POA: Diagnosis not present

## 2023-03-29 DIAGNOSIS — J449 Chronic obstructive pulmonary disease, unspecified: Secondary | ICD-10-CM | POA: Diagnosis not present

## 2023-03-29 DIAGNOSIS — Z87891 Personal history of nicotine dependence: Secondary | ICD-10-CM | POA: Insufficient documentation

## 2023-03-29 DIAGNOSIS — C3411 Malignant neoplasm of upper lobe, right bronchus or lung: Secondary | ICD-10-CM | POA: Insufficient documentation

## 2023-03-29 DIAGNOSIS — Z510 Encounter for antineoplastic radiation therapy: Secondary | ICD-10-CM | POA: Diagnosis not present

## 2023-03-29 DIAGNOSIS — R42 Dizziness and giddiness: Secondary | ICD-10-CM | POA: Diagnosis not present

## 2023-03-29 DIAGNOSIS — R531 Weakness: Secondary | ICD-10-CM | POA: Diagnosis not present

## 2023-03-29 DIAGNOSIS — R634 Abnormal weight loss: Secondary | ICD-10-CM | POA: Diagnosis not present

## 2023-03-29 DIAGNOSIS — R079 Chest pain, unspecified: Secondary | ICD-10-CM | POA: Diagnosis not present

## 2023-03-29 DIAGNOSIS — Z8673 Personal history of transient ischemic attack (TIA), and cerebral infarction without residual deficits: Secondary | ICD-10-CM | POA: Diagnosis not present

## 2023-03-29 DIAGNOSIS — Z452 Encounter for adjustment and management of vascular access device: Secondary | ICD-10-CM | POA: Diagnosis not present

## 2023-03-29 HISTORY — PX: IR IMAGING GUIDED PORT INSERTION: IMG5740

## 2023-03-29 LAB — CMP (CANCER CENTER ONLY)
ALT: 21 U/L (ref 0–44)
AST: 26 U/L (ref 15–41)
Albumin: 3 g/dL — ABNORMAL LOW (ref 3.5–5.0)
Alkaline Phosphatase: 93 U/L (ref 38–126)
Anion gap: 9 (ref 5–15)
BUN: 16 mg/dL (ref 8–23)
CO2: 22 mmol/L (ref 22–32)
Calcium: 9.6 mg/dL (ref 8.9–10.3)
Chloride: 105 mmol/L (ref 98–111)
Creatinine: 0.91 mg/dL (ref 0.61–1.24)
GFR, Estimated: >60 mL/min (ref >60)
Glucose, Bld: 114 mg/dL — ABNORMAL HIGH (ref 70–99)
Potassium: 4.4 mmol/L (ref 3.5–5.1)
Sodium: 136 mmol/L (ref 135–145)
Total Bilirubin: 0.6 mg/dL (ref 0.3–1.2)
Total Protein: 7.1 g/dL (ref 6.5–8.1)

## 2023-03-29 LAB — CBC WITH DIFFERENTIAL (CANCER CENTER ONLY)
Abs Immature Granulocytes: 0.06 10*3/uL (ref 0.00–0.07)
Basophils Absolute: 0.1 10*3/uL (ref 0.0–0.1)
Basophils Relative: 1 %
Eosinophils Absolute: 0.1 10*3/uL (ref 0.0–0.5)
Eosinophils Relative: 1 %
HCT: 35.1 % — ABNORMAL LOW (ref 39.0–52.0)
Hemoglobin: 10.9 g/dL — ABNORMAL LOW (ref 13.0–17.0)
Immature Granulocytes: 0 %
Lymphocytes Relative: 15 %
Lymphs Abs: 2 10*3/uL (ref 0.7–4.0)
MCH: 26.9 pg (ref 26.0–34.0)
MCHC: 31.1 g/dL (ref 30.0–36.0)
MCV: 86.7 fL (ref 80.0–100.0)
Monocytes Absolute: 1 10*3/uL (ref 0.1–1.0)
Monocytes Relative: 8 %
Neutro Abs: 10.1 10*3/uL — ABNORMAL HIGH (ref 1.7–7.7)
Neutrophils Relative %: 75 %
Platelet Count: 551 10*3/uL — ABNORMAL HIGH (ref 150–400)
RBC: 4.05 MIL/uL — ABNORMAL LOW (ref 4.22–5.81)
RDW: 15.6 % — ABNORMAL HIGH (ref 11.5–15.5)
WBC Count: 13.4 10*3/uL — ABNORMAL HIGH (ref 4.0–10.5)
nRBC: 0 % (ref 0.0–0.2)

## 2023-03-29 MED ORDER — MIDAZOLAM HCL 2 MG/2ML IJ SOLN
INTRAMUSCULAR | Status: AC | PRN
  Administered 2023-03-29: 1 mg via INTRAVENOUS

## 2023-03-29 MED ORDER — HEPARIN SOD (PORK) LOCK FLUSH 100 UNIT/ML IV SOLN
500.0000 [IU] | Freq: Once | INTRAVENOUS | Status: AC
  Administered 2023-03-29: 500 [IU] via INTRAVENOUS

## 2023-03-29 MED ORDER — FENTANYL CITRATE (PF) 100 MCG/2ML IJ SOLN
INTRAMUSCULAR | Status: AC
  Filled 2023-03-29: qty 2

## 2023-03-29 MED ORDER — LIDOCAINE HCL 1 % IJ SOLN
20.0000 mL | Freq: Once | INTRAMUSCULAR | Status: AC
  Administered 2023-03-29: 14 mL via INTRADERMAL

## 2023-03-29 MED ORDER — LIDOCAINE HCL 1 % IJ SOLN
INTRAMUSCULAR | Status: AC
  Filled 2023-03-29: qty 20

## 2023-03-29 MED ORDER — MIDAZOLAM HCL 2 MG/2ML IJ SOLN
INTRAMUSCULAR | Status: AC
  Filled 2023-03-29: qty 4

## 2023-03-29 MED ORDER — FENTANYL CITRATE (PF) 100 MCG/2ML IJ SOLN
INTRAMUSCULAR | Status: AC | PRN
  Administered 2023-03-29: 50 ug via INTRAVENOUS

## 2023-03-29 MED ORDER — SODIUM CHLORIDE 0.9 % IV SOLN
INTRAVENOUS | Status: DC
  Administered 2023-03-29: 1000 mL via INTRAVENOUS

## 2023-03-29 MED ORDER — FENTANYL CITRATE (PF) 100 MCG/2ML IJ SOLN
INTRAMUSCULAR | Status: AC | PRN
  Administered 2023-03-29 (×2): 25 ug via INTRAVENOUS

## 2023-03-29 MED ORDER — HEPARIN SOD (PORK) LOCK FLUSH 100 UNIT/ML IV SOLN
INTRAVENOUS | Status: AC
  Filled 2023-03-29: qty 5

## 2023-03-29 NOTE — Procedures (Signed)
Interventional Radiology Procedure Note  Procedure: Single Lumen Power Port Placement    Access:  Left IJ vein.  Findings: Catheter tip positioned at SVC/RA junction. Port is ready for immediate use.   Complications: None  EBL: < 10 mL  Recommendations:  - Ok to shower in 24 hours - Do not submerge for 7 days - Routine line care   Glenn T. Yamagata, M.D Pager:  [phone]   

## 2023-03-29 NOTE — H&P (Signed)
Chief Complaint: Patient was seen in consultation today for port placement at the request of Finnegan,Timothy J  Referring Physician(s): Finnegan,Timothy J  Patient Status: [facility] - Out-pt  History of Present Illness: Ethan Fitzgerald is a 67 y.o. male with a posterior RUL lung adenocarcinoma presenting for port placement prior to beginning chemotherapy. Complaint of right chest wall pain currently.  Past Medical History:  Diagnosis Date   COPD (chronic obstructive pulmonary disease) (HCC)    mild   COVID-19 09/2019   Depression    Erectile dysfunction    Headache    daily, stress headaches   Knee pain, left    Seasonal allergies    Stroke [facility])    Tobacco dependence     Past Surgical History:  Procedure Laterality Date   BACK SURGERY     metal plate in back   COLONOSCOPY     COLONOSCOPY WITH PROPOFOL N/A 04/26/2019   Procedure: COLONOSCOPY WITH PROPOFOL;  Surgeon: Wyline Mood, MD;  Location: [facility] ENDOSCOPY;  Service: Gastroenterology;  Laterality: N/A;   FINE NEEDLE ASPIRATION  03/03/2023   Procedure: FINE NEEDLE ASPIRATION;  Surgeon: Raechel Chute, MD;  Location: [facility] ENDOSCOPY;  Service: Pulmonary;;   HERNIA REPAIR Right    IR ANGIO INTRA EXTRACRAN SEL COM CAROTID INNOMINATE UNI L MOD SED  03/27/2020   IR ANGIO VERTEBRAL SEL SUBCLAVIAN INNOMINATE UNI L MOD SED  03/27/2020   IR ANGIO VERTEBRAL SEL VERTEBRAL UNI R MOD SED  03/27/2020   IR CT HEAD LTD  03/27/2020   IR PERCUTANEOUS ART THROMBECTOMY/INFUSION INTRACRANIAL INC DIAG ANGIO  03/27/2020   KNEE ARTHROSCOPY Left 03/28/2015   Procedure: Arthroscopic partial medial meniscectomy plus chondral debridement;  Surgeon: Erin Sons, MD;  Location: [facility];  Service: Orthopedics;  Laterality: Left;   RADIOLOGY WITH ANESTHESIA N/A 03/27/2020   Procedure: IR WITH ANESTHESIA;  Surgeon: Julieanne Cotton, MD;  Location: [facility] OR;  Service: Radiology;  Laterality: N/A;   VIDEO BRONCHOSCOPY WITH ENDOBRONCHIAL ULTRASOUND   03/03/2023   Procedure: VIDEO BRONCHOSCOPY WITH ENDOBRONCHIAL ULTRASOUND;  Surgeon: Raechel Chute, MD;  Location: [facility] ENDOSCOPY;  Service: Pulmonary;;    Allergies: Patient has no known allergies.  Medications: Prior to Admission medications   Medication Sig Start Date End Date Taking? Authorizing Provider  aspirin EC 325 MG EC tablet Take 1 tablet (325 mg total) by mouth daily. 04/09/20  Yes Love, Evlyn Kanner, PA-C  b complex vitamins capsule Take 1 capsule by mouth daily.   Yes [provider]  Cholecalciferol (VITAMIN D) 50 MCG (2000 UT) tablet Take 2,000 Units by mouth daily.   Yes [provider]  melatonin 5 MG TABS Take 5 mg by mouth at bedtime as needed (sleep).   Yes [provider]  midodrine (PROAMATINE) 5 MG tablet Take 10 mg by mouth 3 (three) times daily with meals.   Yes [provider]  Multiple Vitamin (MULTIVITAMIN WITH MINERALS) TABS tablet Take 1 tablet by mouth daily.   Yes [provider]  Multiple Vitamins-Minerals (IMMUNE SUPPORT VITAMIN C PO) Take 1 tablet by mouth daily.   Yes [provider]  Oxycodone HCl 10 MG TABS Take 1 tablet (10 mg total) by mouth every 6 (six) hours as needed. 03/15/23  Yes Jeralyn Ruths, MD  rosuvastatin (CRESTOR) 40 MG tablet Take 1 tablet (40 mg total) by mouth every other day. 12/29/22  Yes Karamalegos, Alexander J, DO  STIOLTO RESPIMAT 2.5-2.5 MCG/ACT AERS INHALE 2 PUFFS INTO THE LUNGS DAILY. 08/03/22  Yes Karamalegos, Alexander J, DO  albuterol (VENTOLIN HFA) 108 (90 Base) MCG/ACT inhaler INHALE 1 TO 2 PUFFS EVERY 4 HOURS AS NEEDED FOR WHEEZING OR SHORTNESS OF BREATH 07/21/21   Karamalegos, Netta Neat, DO  diphenhydrAMINE (BENADRYL) 50 MG tablet Take 50-100 mg by mouth at bedtime as needed for sleep.    [provider]  fludrocortisone (FLORINEF) 0.1 MG tablet Take 0.1 mg by mouth daily.    [provider]  fluticasone (FLONASE) 50 MCG/ACT nasal spray Place 2 sprays  into both nostrils daily. Patient taking differently: Place 2 sprays into both nostrils daily as needed for allergies. 12/29/22   Karamalegos, Netta Neat, DO  HYDROcodone-acetaminophen (NORCO/VICODIN) 5-325 MG tablet Take 1 tablet by mouth every 6 (six) hours as needed for moderate pain. 02/23/23   Jeralyn Ruths, MD  lidocaine-prilocaine (EMLA) cream Apply to affected area once 03/24/23   Jeralyn Ruths, MD  loratadine (CLARITIN) 10 MG tablet Take 1 tablet (10 mg total) by mouth daily. Use for 4-6 weeks then stop, and use as needed or seasonally Patient taking differently: Take 10 mg by mouth daily as needed for allergies. 12/29/22   Karamalegos, Netta Neat, DO  methocarbamol (ROBAXIN) 500 MG tablet Take 1 tablet (500 mg total) by mouth 4 (four) times daily. 09/17/22   Lorre Munroe, NP  naproxen (NAPROSYN) 500 MG tablet Take 1 tablet (500 mg total) by mouth 2 (two) times daily with a meal. 09/24/22   Poggi, Eileen Stanford E, PA-C  ondansetron (ZOFRAN) 8 MG tablet Take 1 tablet (8 mg total) by mouth every 8 (eight) hours as needed for nausea or vomiting. Start on the third day after chemotherapy. 03/24/23   Jeralyn Ruths, MD  OVER THE COUNTER MEDICATION Take 5 mLs by mouth every other day. [facility] vegan liquid iron    [provider]  prochlorperazine (COMPAZINE) 10 MG tablet Take 1 tablet (10 mg total) by mouth every 6 (six) hours as needed for nausea or vomiting. 03/24/23   Jeralyn Ruths, MD  sildenafil (REVATIO) 20 MG tablet TAKE 1 TO 5 TABLETS BY MOUTH ABOUT 30 MINUTES PRIOR TO SEX.START WITH 1 THEN INCREASE 03/10/22   Smitty Cords, DO     Family History  Problem Relation Age of Onset   Diabetes Mother    Heart attack Mother 26   Cancer Father        liver    COPD Brother    HIV/AIDS Brother    Prostate cancer Neg Hx    Colon cancer Neg Hx     Social History   Socioeconomic History   Marital status: Married    Spouse name: Aurea Graff   Number of children:  0   Years of education: Not on file   Highest education level: Some college, no degree  Occupational History   Not on file  Tobacco Use   Smoking status: Former    Current packs/day: 0.00    Average packs/day: 1.5 packs/day for 49.0 years (73.5 ttl pk-yrs)    Types: Cigarettes    Start date: 29    Quit date: 09/14/2017    Years since quitting: 5.5   Smokeless tobacco: Former  Building services engineer status: Never Used  Substance and Sexual Activity   Alcohol use: Not Currently    Comment: 6 pack on weekend   Drug use: No   Sexual activity: Not on file  Other Topics Concern   Not on file  Social History  Narrative   Lives at home w wife   L handed   Caffeine: 1 C of coffee AM   Social Determinants of Health   Financial Resource Strain: Low Risk  (02/09/2023)   Overall Financial Resource Strain (CARDIA)    Difficulty of Paying Living Expenses: Not hard at all  Food Insecurity: No Food Insecurity (02/09/2023)   Hunger Vital Sign    Worried About Running Out of Food in the Last Year: Never true    Ran Out of Food in the Last Year: Never true  Transportation Needs: No Transportation Needs (02/09/2023)   PRAPARE - Administrator, Civil Service (Medical): No    Lack of Transportation (Non-Medical): No  Physical Activity: Insufficiently Active (11/05/2022)   Exercise Vital Sign    Days of Exercise per Week: 3 days    Minutes of Exercise per Session: 20 min  Stress: No Stress Concern Present (11/05/2022)   [employer] of Occupational Health - Occupational Stress Questionnaire    Feeling of Stress : Not at all  Social Connections: Socially Isolated (11/05/2022)   Social Connection and Isolation Panel [NHANES]    Frequency of Communication with Friends and Family: Once a week    Frequency of Social Gatherings with Friends and Family: Never    Attends Religious Services: Never    Database administrator or Organizations: No    Attends Engineer, structural: Never     Marital Status: Married    ECOG Status: 1 - Symptomatic but completely ambulatory  Review of Systems: A 12 point ROS discussed and pertinent positives are indicated in the HPI above.  All other systems are negative.  Review of Systems  Constitutional: Negative.   Respiratory: Negative.    Cardiovascular:  Positive for chest pain.  Gastrointestinal: Negative.   Genitourinary: Negative.   Musculoskeletal: Negative.   Neurological: Negative.     Vital Signs: BP 99/73   Pulse 86   Temp 98 F (36.7 C) (Oral)   Resp 20   Ht 6' (1.829 m)   Wt 63.5 kg   SpO2 98%   BMI 18.99 kg/m    Physical Exam Vitals reviewed.  Constitutional:      General: He is not in acute distress.    Appearance: He is not ill-appearing, toxic-appearing or diaphoretic.  Cardiovascular:     Rate and Rhythm: Normal rate and regular rhythm.     Heart sounds: Normal heart sounds. No murmur heard.    No friction rub. No gallop.  Pulmonary:     Effort: Pulmonary effort is normal. No respiratory distress.     Breath sounds: Normal breath sounds. No stridor. No wheezing, rhonchi or rales.  Abdominal:     General: Bowel sounds are normal. There is no distension.     Palpations: Abdomen is soft.     Tenderness: There is no abdominal tenderness. There is no guarding or rebound.  Musculoskeletal:        General: No swelling.     Cervical back: Neck supple.  Skin:    General: Skin is warm and dry.  Neurological:     Mental Status: He is alert and oriented to person, place, and time.     Imaging: CT Super D Chest Wo Contrast  Result Date: 03/07/2023 CLINICAL DATA:  Right lower lobe mass.  * Tracking Code: BO * EXAM: CT CHEST WITHOUT CONTRAST TECHNIQUE: Multidetector CT imaging of the chest was performed using thin slice collimation for electromagnetic bronchoscopy  planning purposes, without intravenous contrast. RADIATION DOSE REDUCTION: This exam was performed according to the departmental  dose-optimization program which includes automated exposure control, adjustment of the mA and/or kV according to patient size and/or use of iterative reconstruction technique. COMPARISON:  PET 02/14/2023, MR brain 02/11/2023, CT chest abdomen pelvis 02/05/2023. FINDINGS: Cardiovascular: Atherosclerotic calcification of the aorta and coronary arteries. Heart size normal. No pericardial effusion. Mediastinum/Nodes: No pathologically enlarged mediastinal or axillary lymph nodes. Hilar regions are difficult to definitively evaluate without IV contrast but there does appear to be some right hilar enlargement, suggesting adenopathy. Esophagus is grossly unremarkable. Lungs/Pleura: Centrilobular and paraseptal emphysema. Large posterior segment right upper lobe mass crosses the major fissure into the right lower lobe, measuring 6.7 x 8.4 cm, increased in size from 5.8 x 7.8 cm on 02/05/2023. It has a long border of contact with the adjacent chest wall and obliterates the adjacent extrapleural fat (e.g. 2/77). Scattered linear scarring in the lung bases. No pleural fluid. Debris is seen in the airway. Upper Abdomen: Visualized portion of the liver is grossly unremarkable. Stones in the gallbladder. Low-attenuation thickening of the right adrenal gland, unchanged and not hypermetabolic on 02/14/2023. No specific follow-up necessary. Adrenal glands and right kidney are otherwise unremarkable. Punctate stone in the left kidney. Visualized portions of the spleen, pancreas, stomach and bowel are grossly unremarkable. No upper abdominal adenopathy. Musculoskeletal: Degenerative changes in the spine. No worrisome lytic or sclerotic lesions. IMPRESSION: 1. Enlarging right upper lobe primary bronchogenic carcinoma with extension into the right lower lobe and probable developing right hilar adenopathy. Long border of contact with the adjacent chest wall with extrapleural fat obliteration, highly suggestive of chest wall invasion. 2.  Cholelithiasis. 3. Punctate left renal stone. 4. Aortic atherosclerosis (ICD10-I70.0). Coronary artery calcification. 5.  Emphysema (ICD10-J43.9). Electronically Signed   By: Leanna Battles M.D.   On: 03/07/2023 09:08    Labs:  CBC: Recent Labs    12/22/22 0808 02/05/23 1021 03/29/23 1312  WBC 13.7* 15.0* 13.4*  HGB 12.3* 12.1* 10.9*  HCT 38.7 39.7 35.1*  PLT 531* 407* 551*    COAGS: No results for input(s): "INR", "APTT" in the last 8760 hours.  BMP: Recent Labs    12/22/22 0808 02/05/23 1021 03/29/23 1312  NA 141 135 136  K 4.5 3.9 4.4  CL 107 105 105  CO2 24 22 22   GLUCOSE 123* 119* 114*  BUN 12 14 16   CALCIUM 9.9 9.3 9.6  CREATININE 0.92 0.99 0.91  GFRNONAA  --  >60 >60    LIVER FUNCTION TESTS: Recent Labs    12/22/22 0808 03/29/23 1312  BILITOT 0.5 0.6  AST 22 26  ALT 16 21  ALKPHOS  --  93  PROT 6.4 7.1  ALBUMIN  --  3.0*     Assessment and Plan:  For port placement today. Will plan on left sided placement due to plans for right chest radiation.  Risks and benefits of image guided port-a-catheter placement was discussed with the patient including, but not limited to bleeding, infection, pneumothorax, or fibrin sheath development and need for additional procedures.  All of the patient's questions were answered, patient is agreeable to proceed. Consent signed and in chart.  Thank you for this interesting consult.  I greatly enjoyed meeting Ethan Fitzgerald and look forward to participating in their care.  A copy of this report was sent to the requesting provider on this date.  Electronically Signed: Reola Calkins, MD 03/29/2023, 2:42 PM  I spent a total of 15 Minutes  in face to face in clinical consultation, greater than 50% of which was counseling/coordinating care for port placement

## 2023-03-30 ENCOUNTER — Encounter: Payer: Self-pay | Admitting: *Deleted

## 2023-03-30 ENCOUNTER — Ambulatory Visit
Admission: RE | Admit: 2023-03-30 | Discharge: 2023-03-30 | Disposition: A | Discharge status: Home / Self Care | Payer: Medicare HMO | Source: Ambulatory Visit | Attending: Radiation Oncology | Admitting: Radiation Oncology

## 2023-03-30 DIAGNOSIS — C3411 Malignant neoplasm of upper lobe, right bronchus or lung: Secondary | ICD-10-CM | POA: Diagnosis not present

## 2023-03-30 DIAGNOSIS — R634 Abnormal weight loss: Secondary | ICD-10-CM | POA: Diagnosis not present

## 2023-03-30 DIAGNOSIS — Z510 Encounter for antineoplastic radiation therapy: Secondary | ICD-10-CM | POA: Diagnosis not present

## 2023-03-30 DIAGNOSIS — R079 Chest pain, unspecified: Secondary | ICD-10-CM | POA: Diagnosis not present

## 2023-03-30 DIAGNOSIS — J449 Chronic obstructive pulmonary disease, unspecified: Secondary | ICD-10-CM | POA: Diagnosis not present

## 2023-03-30 DIAGNOSIS — R531 Weakness: Secondary | ICD-10-CM | POA: Diagnosis not present

## 2023-03-30 DIAGNOSIS — Z87891 Personal history of nicotine dependence: Secondary | ICD-10-CM | POA: Diagnosis not present

## 2023-03-30 DIAGNOSIS — R42 Dizziness and giddiness: Secondary | ICD-10-CM | POA: Diagnosis not present

## 2023-03-30 DIAGNOSIS — Z8673 Personal history of transient ischemic attack (TIA), and cerebral infarction without residual deficits: Secondary | ICD-10-CM | POA: Diagnosis not present

## 2023-03-30 DIAGNOSIS — Z5111 Encounter for antineoplastic chemotherapy: Secondary | ICD-10-CM | POA: Diagnosis not present

## 2023-03-30 NOTE — Progress Notes (Signed)
Met with pt and his wife during CT simulation appt today. All questions answered during visit. Reassurance provided. Reviewed upcoming appts. Instructed to call with any questions or needs. Pt and wife verbalized understanding.

## 2023-03-31 ENCOUNTER — Other Ambulatory Visit: Payer: Self-pay | Admitting: *Deleted

## 2023-03-31 ENCOUNTER — Inpatient Hospital Stay: Payer: Medicare HMO

## 2023-03-31 ENCOUNTER — Other Ambulatory Visit: Payer: Self-pay

## 2023-03-31 DIAGNOSIS — Z8673 Personal history of transient ischemic attack (TIA), and cerebral infarction without residual deficits: Secondary | ICD-10-CM | POA: Diagnosis not present

## 2023-03-31 DIAGNOSIS — Z87891 Personal history of nicotine dependence: Secondary | ICD-10-CM | POA: Diagnosis not present

## 2023-03-31 DIAGNOSIS — Z5111 Encounter for antineoplastic chemotherapy: Secondary | ICD-10-CM | POA: Diagnosis not present

## 2023-03-31 DIAGNOSIS — R531 Weakness: Secondary | ICD-10-CM | POA: Diagnosis not present

## 2023-03-31 DIAGNOSIS — J449 Chronic obstructive pulmonary disease, unspecified: Secondary | ICD-10-CM | POA: Diagnosis not present

## 2023-03-31 DIAGNOSIS — C3411 Malignant neoplasm of upper lobe, right bronchus or lung: Secondary | ICD-10-CM | POA: Diagnosis not present

## 2023-03-31 DIAGNOSIS — R634 Abnormal weight loss: Secondary | ICD-10-CM | POA: Diagnosis not present

## 2023-03-31 DIAGNOSIS — Z510 Encounter for antineoplastic radiation therapy: Secondary | ICD-10-CM | POA: Diagnosis not present

## 2023-03-31 DIAGNOSIS — R079 Chest pain, unspecified: Secondary | ICD-10-CM | POA: Diagnosis not present

## 2023-03-31 DIAGNOSIS — R42 Dizziness and giddiness: Secondary | ICD-10-CM | POA: Diagnosis not present

## 2023-03-31 NOTE — Progress Notes (Signed)
The proposed treatment discussed in conference is for discussion purpose only and is not a binding recommendation.  The patients have not been physically examined, or presented with their treatment options.  Therefore, final treatment plans cannot be decided.  

## 2023-04-01 ENCOUNTER — Ambulatory Visit (INDEPENDENT_AMBULATORY_CARE_PROVIDER_SITE_OTHER): Payer: Medicare HMO | Admitting: Thoracic Surgery (Cardiothoracic Vascular Surgery)

## 2023-04-01 ENCOUNTER — Encounter: Payer: Self-pay | Admitting: Oncology

## 2023-04-01 DIAGNOSIS — C3411 Malignant neoplasm of upper lobe, right bronchus or lung: Secondary | ICD-10-CM

## 2023-04-01 NOTE — Progress Notes (Signed)
     [address]       [place] [address]             [phone]       Patient: Home Provider: Office Consent for Telemedicine visit obtained.  Today's visit was completed via a real-time telehealth (see specific modality noted below). The patient/authorized person provided oral consent at the time of the visit to engage in a telemedicine encounter with the present provider at [facility]. The patient/authorized person was informed of the potential benefits, limitations, and risks of telemedicine. The patient/authorized person expressed understanding that the laws that protect confidentiality also apply to telemedicine. The patient/authorized person acknowledged understanding that telemedicine does not provide emergency services and that he or she would need to call 911 or proceed to the nearest hospital for help if such a need arose.   Total time spent in the clinical discussion 10 minutes.  Telehealth Modality: Phone visit (audio only)  I had a telephone visit with Mr. Overcash.  I explained to him that the decision from Tumor Board was to proceed with radiation therapy.  Given that this mass involves both the upper and lower lobes, and there is possible mediastinal invasion, surgery would not be an option.  Corrisa Gibby Keane Scrape

## 2023-04-04 ENCOUNTER — Other Ambulatory Visit: Payer: Self-pay | Admitting: *Deleted

## 2023-04-04 DIAGNOSIS — C3411 Malignant neoplasm of upper lobe, right bronchus or lung: Secondary | ICD-10-CM

## 2023-04-04 MED FILL — Dexamethasone Sodium Phosphate Inj 100 MG/10ML: INTRAMUSCULAR | Qty: 1 | Status: AC

## 2023-04-05 ENCOUNTER — Inpatient Hospital Stay: Payer: Medicare HMO

## 2023-04-05 ENCOUNTER — Ambulatory Visit
Admission: RE | Admit: 2023-04-05 | Discharge: 2023-04-05 | Disposition: A | Discharge status: Home / Self Care | Payer: Medicare HMO | Source: Ambulatory Visit | Attending: Radiation Oncology | Admitting: Radiation Oncology

## 2023-04-05 ENCOUNTER — Encounter: Payer: Self-pay | Admitting: Oncology

## 2023-04-05 ENCOUNTER — Encounter: Payer: Self-pay | Admitting: *Deleted

## 2023-04-05 ENCOUNTER — Inpatient Hospital Stay: Payer: Medicare HMO | Admitting: Oncology

## 2023-04-05 VITALS — BP 110/72 | HR 73 | Resp 16

## 2023-04-05 VITALS — BP 89/67 | HR 90 | Temp 98.3°F | Resp 16 | Ht 72.0 in | Wt 139.4 lb

## 2023-04-05 DIAGNOSIS — R079 Chest pain, unspecified: Secondary | ICD-10-CM | POA: Diagnosis not present

## 2023-04-05 DIAGNOSIS — R531 Weakness: Secondary | ICD-10-CM | POA: Diagnosis not present

## 2023-04-05 DIAGNOSIS — Z5111 Encounter for antineoplastic chemotherapy: Secondary | ICD-10-CM | POA: Diagnosis not present

## 2023-04-05 DIAGNOSIS — C3411 Malignant neoplasm of upper lobe, right bronchus or lung: Secondary | ICD-10-CM

## 2023-04-05 DIAGNOSIS — R634 Abnormal weight loss: Secondary | ICD-10-CM | POA: Diagnosis not present

## 2023-04-05 DIAGNOSIS — Z510 Encounter for antineoplastic radiation therapy: Secondary | ICD-10-CM | POA: Diagnosis not present

## 2023-04-05 DIAGNOSIS — J449 Chronic obstructive pulmonary disease, unspecified: Secondary | ICD-10-CM | POA: Diagnosis not present

## 2023-04-05 DIAGNOSIS — R42 Dizziness and giddiness: Secondary | ICD-10-CM | POA: Diagnosis not present

## 2023-04-05 DIAGNOSIS — Z8673 Personal history of transient ischemic attack (TIA), and cerebral infarction without residual deficits: Secondary | ICD-10-CM | POA: Diagnosis not present

## 2023-04-05 LAB — CMP (CANCER CENTER ONLY)
ALT: 24 U/L (ref 0–44)
AST: 25 U/L (ref 15–41)
Albumin: 2.8 g/dL — ABNORMAL LOW (ref 3.5–5.0)
Alkaline Phosphatase: 99 U/L (ref 38–126)
Anion gap: 8 (ref 5–15)
BUN: 16 mg/dL (ref 8–23)
CO2: 21 mmol/L — ABNORMAL LOW (ref 22–32)
Calcium: 9.6 mg/dL (ref 8.9–10.3)
Chloride: 104 mmol/L (ref 98–111)
Creatinine: 0.88 mg/dL (ref 0.61–1.24)
GFR, Estimated: >60 mL/min (ref >60)
Glucose, Bld: 118 mg/dL — ABNORMAL HIGH (ref 70–99)
Potassium: 3.6 mmol/L (ref 3.5–5.1)
Sodium: 133 mmol/L — ABNORMAL LOW (ref 135–145)
Total Bilirubin: 0.6 mg/dL (ref 0.3–1.2)
Total Protein: 7 g/dL (ref 6.5–8.1)

## 2023-04-05 LAB — CBC WITH DIFFERENTIAL/PLATELET
Abs Immature Granulocytes: 0.06 10*3/uL (ref 0.00–0.07)
Basophils Absolute: 0.1 10*3/uL (ref 0.0–0.1)
Basophils Relative: 0 %
Eosinophils Absolute: 0.3 10*3/uL (ref 0.0–0.5)
Eosinophils Relative: 2 %
HCT: 34 % — ABNORMAL LOW (ref 39.0–52.0)
Hemoglobin: 10.7 g/dL — ABNORMAL LOW (ref 13.0–17.0)
Immature Granulocytes: 0 %
Lymphocytes Relative: 14 %
Lymphs Abs: 1.9 10*3/uL (ref 0.7–4.0)
MCH: 26.7 pg (ref 26.0–34.0)
MCHC: 31.5 g/dL (ref 30.0–36.0)
MCV: 84.8 fL (ref 80.0–100.0)
Monocytes Absolute: 1.2 10*3/uL — ABNORMAL HIGH (ref 0.1–1.0)
Monocytes Relative: 8 %
Neutro Abs: 10.1 10*3/uL — ABNORMAL HIGH (ref 1.7–7.7)
Neutrophils Relative %: 76 %
Platelets: 602 10*3/uL — ABNORMAL HIGH (ref 150–400)
RBC: 4.01 MIL/uL — ABNORMAL LOW (ref 4.22–5.81)
RDW: 15.5 % (ref 11.5–15.5)
WBC: 13.6 10*3/uL — ABNORMAL HIGH (ref 4.0–10.5)
nRBC: 0 % (ref 0.0–0.2)

## 2023-04-05 MED ORDER — FAMOTIDINE IN NACL 20-0.9 MG/50ML-% IV SOLN
20.0000 mg | Freq: Once | INTRAVENOUS | Status: AC
  Administered 2023-04-05: 20 mg via INTRAVENOUS
  Filled 2023-04-05: qty 50

## 2023-04-05 MED ORDER — SODIUM CHLORIDE 0.9 % IV SOLN
Freq: Once | INTRAVENOUS | Status: AC
  Filled 2023-04-05: qty 250

## 2023-04-05 MED ORDER — HEPARIN SOD (PORK) LOCK FLUSH 100 UNIT/ML IV SOLN
500.0000 [IU] | Freq: Once | INTRAVENOUS | Status: DC | PRN
  Filled 2023-04-05: qty 5

## 2023-04-05 MED ORDER — SODIUM CHLORIDE 0.9% FLUSH
10.0000 mL | Freq: Once | INTRAVENOUS | Status: AC
  Administered 2023-04-05: 10 mL via INTRAVENOUS
  Filled 2023-04-05: qty 10

## 2023-04-05 MED ORDER — SODIUM CHLORIDE 0.9 % IV SOLN
45.0000 mg/m2 | Freq: Once | INTRAVENOUS | Status: AC
  Administered 2023-04-05: 84 mg via INTRAVENOUS
  Filled 2023-04-05: qty 14

## 2023-04-05 MED ORDER — HEPARIN SOD (PORK) LOCK FLUSH 100 UNIT/ML IV SOLN
500.0000 [IU] | Freq: Once | INTRAVENOUS | Status: DC
  Filled 2023-04-05: qty 5

## 2023-04-05 MED ORDER — DEXAMETHASONE 4 MG PO TABS: 4.0000 mg | ORAL_TABLET | Freq: Every day | ORAL | 2 refills | Status: DC

## 2023-04-05 MED ORDER — PALONOSETRON HCL INJECTION 0.25 MG/5ML
0.2500 mg | Freq: Once | INTRAVENOUS | Status: AC
  Administered 2023-04-05: 0.25 mg via INTRAVENOUS
  Filled 2023-04-05: qty 5

## 2023-04-05 MED ORDER — SODIUM CHLORIDE 0.9 % IV SOLN
177.4000 mg | Freq: Once | INTRAVENOUS | Status: AC
  Administered 2023-04-05: 180 mg via INTRAVENOUS
  Filled 2023-04-05: qty 18

## 2023-04-05 MED ORDER — DIPHENHYDRAMINE HCL 50 MG/ML IJ SOLN
25.0000 mg | Freq: Once | INTRAMUSCULAR | Status: AC
  Administered 2023-04-05: 25 mg via INTRAVENOUS
  Filled 2023-04-05: qty 1

## 2023-04-05 MED ORDER — SODIUM CHLORIDE 0.9 % IV SOLN
10.0000 mg | Freq: Once | INTRAVENOUS | Status: AC
  Administered 2023-04-05: 10 mg via INTRAVENOUS
  Filled 2023-04-05: qty 10

## 2023-04-05 NOTE — Progress Notes (Signed)
Met with patient during follow up visit with Dr. Orlie Dakin to start chemotherapy. All questions answered during visit. Reviewed upcoming appts. Instructed to call with any questions or needs. Pt verbalized understanding.

## 2023-04-05 NOTE — Patient Instructions (Signed)
[facility] [place] [facility]  Discharge Instructions: Thank you for choosing [facility] to provide your oncology and hematology care.  If you have a lab appointment with the Cancer Center, please go directly to the Cancer Center and check in at the registration area.  Wear comfortable clothing and clothing appropriate for easy access to any Portacath or PICC line.   We strive to give you quality time with your provider. You may need to reschedule your appointment if you arrive late (15 or more minutes).  Arriving late affects you and other patients whose appointments are after yours.  Also, if you miss three or more appointments without notifying the office, you may be dismissed from the clinic at the provider's discretion.      For prescription refill requests, have your pharmacy contact our office and allow 72 hours for refills to be completed.    Today you received the following chemotherapy and/or immunotherapy agents Taxol & Paraplatin      To help prevent nausea and vomiting after your treatment, we encourage you to take your nausea medication as directed.  BELOW ARE SYMPTOMS THAT SHOULD BE REPORTED IMMEDIATELY: *FEVER GREATER THAN 100.4 F (38 C) OR HIGHER *CHILLS OR SWEATING *NAUSEA AND VOMITING THAT IS NOT CONTROLLED WITH YOUR NAUSEA MEDICATION *UNUSUAL SHORTNESS OF BREATH *UNUSUAL BRUISING OR BLEEDING *URINARY PROBLEMS (pain or burning when urinating, or frequent urination) *BOWEL PROBLEMS (unusual diarrhea, constipation, pain near the anus) TENDERNESS IN MOUTH AND THROAT WITH OR WITHOUT PRESENCE OF ULCERS (sore throat, sores in mouth, or a toothache) UNUSUAL RASH, SWELLING OR PAIN  UNUSUAL VAGINAL DISCHARGE OR ITCHING   Items with * indicate a potential emergency and should be followed up as soon as possible or go to the Emergency Department if any problems should occur.  Please show the CHEMOTHERAPY ALERT CARD or IMMUNOTHERAPY ALERT CARD at  check-in to the Emergency Department and triage nurse.  Should you have questions after your visit or need to cancel or reschedule your appointment, please contact [facility] [place] [facility]  [phone] and follow the prompts.  Office hours are 8:00 a.m. to 4:30 p.m. Monday - Friday. Please note that voicemails left after 4:00 p.m. may not be returned until the following business day.  We are closed weekends and major holidays. You have access to a nurse at all times for urgent questions. Please call the main number to the clinic [phone] and follow the prompts.  For any non-urgent questions, you may also contact your provider using MyChart. We now offer e-Visits for anyone 18 and older to request care online for non-urgent symptoms. For details visit [email or web address].   Also download the MyChart app! Go to the app store, search "MyChart", open the app, select [facility], and log in with your MyChart username and password.    

## 2023-04-05 NOTE — Progress Notes (Signed)
[facility]  [phone]) [phone] Fax:(336) [phone]  ID: Ethan Fitzgerald OB: 05/17/1956  MR#: [id number]  UVO#:[id number]  Patient Care Team: Smitty Cords, DO as PCP - General (Family Medicine) Micki Riley, MD as Referring Physician (Neurology) Glory Buff, RN as Oncology Nurse Navigator  CHIEF COMPLAINT: Stage IIIa adenocarcinoma of the right upper lobe lung.  INTERVAL HISTORY: Patient returns to clinic today for further evaluation and initiation of cycle 1 of weekly carboplatin and Taxol.  He has increased weakness and fatigue as well as dizziness.  He continues to have a fair appetite.  His pain is moderately controlled on his current narcotic regimen.    He has no neurologic complaints.  He denies any recent fevers or illnesses.   He has chest pain, but denies any shortness of breath, cough, or hemoptysis.  He has no nausea, vomiting, constipation, or diarrhea.  He has no urinary complaints.  Patient offers no further specific complaints today.  REVIEW OF SYSTEMS:   Review of Systems  Constitutional:  Positive for malaise/fatigue and weight loss. Negative for fever.  Respiratory: Negative.  Negative for cough, hemoptysis and shortness of breath.   Cardiovascular:  Positive for chest pain. Negative for leg swelling.  Gastrointestinal: Negative.  Negative for abdominal pain.  Genitourinary: Negative.  Negative for dysuria.  Musculoskeletal: Negative.  Negative for back pain.  Skin: Negative.  Negative for rash.  Neurological:  Positive for weakness. Negative for dizziness, focal weakness and headaches.  Psychiatric/Behavioral:  The patient is nervous/anxious.     As per HPI. Otherwise, a complete review of systems is negative.  PAST MEDICAL HISTORY: Past Medical History:  Diagnosis Date   COPD (chronic obstructive pulmonary disease) (HCC)    mild   COVID-19 09/2019   Depression    Erectile dysfunction    Headache    daily, stress  headaches   Knee pain, left    Seasonal allergies    Stroke (HCC)    Tobacco dependence     PAST SURGICAL HISTORY: Past Surgical History:  Procedure Laterality Date   BACK SURGERY     metal plate in back   COLONOSCOPY     COLONOSCOPY WITH PROPOFOL N/A 04/26/2019   Procedure: COLONOSCOPY WITH PROPOFOL;  Surgeon: Wyline Mood, MD;  Location: [facility] ENDOSCOPY;  Service: Gastroenterology;  Laterality: N/A;   FINE NEEDLE ASPIRATION  03/03/2023   Procedure: FINE NEEDLE ASPIRATION;  Surgeon: Raechel Chute, MD;  Location: [facility] ENDOSCOPY;  Service: Pulmonary;;   HERNIA REPAIR Right    IR ANGIO INTRA EXTRACRAN SEL COM CAROTID INNOMINATE UNI L MOD SED  03/27/2020   IR ANGIO VERTEBRAL SEL SUBCLAVIAN INNOMINATE UNI L MOD SED  03/27/2020   IR ANGIO VERTEBRAL SEL VERTEBRAL UNI R MOD SED  03/27/2020   IR CT HEAD LTD  03/27/2020   IR IMAGING GUIDED PORT INSERTION  03/29/2023   IR PERCUTANEOUS ART THROMBECTOMY/INFUSION INTRACRANIAL INC DIAG ANGIO  03/27/2020   KNEE ARTHROSCOPY Left 03/28/2015   Procedure: Arthroscopic partial medial meniscectomy plus chondral debridement;  Surgeon: Erin Sons, MD;  Location: [facility];  Service: Orthopedics;  Laterality: Left;   RADIOLOGY WITH ANESTHESIA N/A 03/27/2020   Procedure: IR WITH ANESTHESIA;  Surgeon: Julieanne Cotton, MD;  Location: [facility] OR;  Service: Radiology;  Laterality: N/A;   VIDEO BRONCHOSCOPY WITH ENDOBRONCHIAL ULTRASOUND  03/03/2023   Procedure: VIDEO BRONCHOSCOPY WITH ENDOBRONCHIAL ULTRASOUND;  Surgeon: Raechel Chute, MD;  Location: [facility] ENDOSCOPY;  Service: Pulmonary;;    FAMILY HISTORY: Family History  Problem Relation Age of Onset   Diabetes Mother    Heart attack Mother 87   Cancer Father        liver    COPD Brother    HIV/AIDS Brother    Prostate cancer Neg Hx    Colon cancer Neg Hx     ADVANCED DIRECTIVES (Y/N):  N  HEALTH MAINTENANCE: Social History   Tobacco Use   Smoking status: Former    Current packs/day: 0.00     Average packs/day: 1.5 packs/day for 49.0 years (73.5 ttl pk-yrs)    Types: Cigarettes    Start date: 21    Quit date: 09/14/2017    Years since quitting: 5.5   Smokeless tobacco: Former  Building services engineer status: Never Used  Substance Use Topics   Alcohol use: Not Currently    Comment: 6 pack on weekend   Drug use: No     Colonoscopy:  PAP:  Bone density:  Lipid panel:  No Known Allergies  Current Outpatient Medications  Medication Sig Dispense Refill   albuterol (VENTOLIN HFA) 108 (90 Base) MCG/ACT inhaler INHALE 1 TO 2 PUFFS EVERY 4 HOURS AS NEEDED FOR WHEEZING OR SHORTNESS OF BREATH 1 each 2   b complex vitamins capsule Take 1 capsule by mouth daily.     Cholecalciferol (VITAMIN D) 50 MCG (2000 UT) tablet Take 2,000 Units by mouth daily.     dexamethasone (DECADRON) 4 MG tablet Take 1 tablet (4 mg total) by mouth daily. 30 tablet 2   diphenhydrAMINE (BENADRYL) 50 MG tablet Take 50-100 mg by mouth at bedtime as needed for sleep.     fludrocortisone (FLORINEF) 0.1 MG tablet Take 0.1 mg by mouth daily.     fluticasone (FLONASE) 50 MCG/ACT nasal spray Place 2 sprays into both nostrils daily. (Patient taking differently: Place 2 sprays into both nostrils daily as needed for allergies.) 48 g 3   lidocaine-prilocaine (EMLA) cream Apply to affected area once 30 g 3   loratadine (CLARITIN) 10 MG tablet Take 1 tablet (10 mg total) by mouth daily. Use for 4-6 weeks then stop, and use as needed or seasonally (Patient taking differently: Take 10 mg by mouth daily as needed for allergies.) 30 tablet 11   melatonin 5 MG TABS Take 5 mg by mouth at bedtime as needed (sleep).     midodrine (PROAMATINE) 5 MG tablet Take 10 mg by mouth 3 (three) times daily with meals.     Multiple Vitamin (MULTIVITAMIN WITH MINERALS) TABS tablet Take 1 tablet by mouth daily.     Multiple Vitamins-Minerals (IMMUNE SUPPORT VITAMIN C PO) Take 1 tablet by mouth daily.     naproxen (NAPROSYN) 500 MG tablet Take 1  tablet (500 mg total) by mouth 2 (two) times daily with a meal. 10 tablet 0   OVER THE COUNTER MEDICATION Take 5 mLs by mouth every other day. [employer] vegan liquid iron     Oxycodone HCl 10 MG TABS Take 1 tablet (10 mg total) by mouth every 6 (six) hours as needed. 60 tablet 0   rosuvastatin (CRESTOR) 40 MG tablet Take 1 tablet (40 mg total) by mouth every other day. 45 tablet 3   sildenafil (REVATIO) 20 MG tablet TAKE 1 TO 5 TABLETS BY MOUTH ABOUT 30 MINUTES PRIOR TO SEX.START WITH 1 THEN INCREASE 30 tablet 5   STIOLTO RESPIMAT 2.5-2.5 MCG/ACT AERS INHALE 2 PUFFS INTO THE LUNGS DAILY. 12 g 3   aspirin EC 325 MG  EC tablet Take 1 tablet (325 mg total) by mouth daily. (Patient not taking: Reported on 04/05/2023) 90 tablet 0   methocarbamol (ROBAXIN) 500 MG tablet Take 1 tablet (500 mg total) by mouth 4 (four) times daily. (Patient not taking: Reported on 04/05/2023) 20 tablet 0   ondansetron (ZOFRAN) 8 MG tablet Take 1 tablet (8 mg total) by mouth every 8 (eight) hours as needed for nausea or vomiting. Start on the third day after chemotherapy. (Patient not taking: Reported on 04/05/2023) 60 tablet 2   prochlorperazine (COMPAZINE) 10 MG tablet Take 1 tablet (10 mg total) by mouth every 6 (six) hours as needed for nausea or vomiting. (Patient not taking: Reported on 04/05/2023) 60 tablet 2   No current facility-administered medications for this visit.   Facility-Administered Medications Ordered in Other Visits  Medication Dose Route Frequency Provider Last Rate Last Admin   CARBOplatin (PARAPLATIN) 180 mg in sodium chloride 0.9 % 100 mL chemo infusion  180 mg Intravenous Once Jeralyn Ruths, MD       heparin lock flush 100 unit/mL  500 Units Intravenous Once Jeralyn Ruths, MD       heparin lock flush 100 unit/mL  500 Units Intracatheter Once PRN Jeralyn Ruths, MD        OBJECTIVE: Vitals:   04/05/23 0906 04/05/23 0913  BP: (!) 86/58 (!) 89/67  Pulse: 90   Resp: 16   Temp: 98.3  F (36.8 C)   SpO2: 97%       Body mass index is 18.91 kg/m.    ECOG FS:1 - Symptomatic but completely ambulatory  General: Well-developed, well-nourished, no acute distress.  Sitting in a wheelchair. Eyes: Pink conjunctiva, anicteric sclera. HEENT: Normocephalic, moist mucous membranes. Lungs: No audible wheezing or coughing. Heart: Regular rate and rhythm. Abdomen: Soft, nontender, no obvious distention. Musculoskeletal: No edema, cyanosis, or clubbing. Neuro: Alert, answering all questions appropriately. Cranial nerves grossly intact. Skin: No rashes or petechiae noted. Psych: Normal affect.  LAB RESULTS:  Lab Results  Component Value Date   NA 133 (L) 04/05/2023   K 3.6 04/05/2023   CL 104 04/05/2023   CO2 21 (L) 04/05/2023   GLUCOSE 118 (H) 04/05/2023   BUN 16 04/05/2023   CREATININE 0.88 04/05/2023   CALCIUM 9.6 04/05/2023   PROT 7.0 04/05/2023   ALBUMIN 2.8 (L) 04/05/2023   AST 25 04/05/2023   ALT 24 04/05/2023   ALKPHOS 99 04/05/2023   BILITOT 0.6 04/05/2023   GFRNONAA >60 04/05/2023   GFRAA 85 12/22/2020    Lab Results  Component Value Date   WBC 13.6 (H) 04/05/2023   NEUTROABS 10.1 (H) 04/05/2023   HGB 10.7 (L) 04/05/2023   HCT 34.0 (L) 04/05/2023   MCV 84.8 04/05/2023   PLT 602 (H) 04/05/2023     STUDIES: IR IMAGING GUIDED PORT INSERTION  Result Date: 03/29/2023 CLINICAL DATA:  Right upper lobe lung adenocarcinoma and need for porta cath to begin chemotherapy. EXAM: IMPLANTED PORT A CATH PLACEMENT WITH ULTRASOUND AND FLUOROSCOPIC GUIDANCE ANESTHESIA/SEDATION: Moderate (conscious) sedation was employed during this procedure. A total of Versed 2.0 mg and Fentanyl 100 mcg was administered intravenously by radiology nursing. Moderate Sedation Time: 37 minutes. The patient's level of consciousness and vital signs were monitored continuously by radiology nursing throughout the procedure under my direct supervision. FLUOROSCOPY: 1.0 mGy PROCEDURE: The  procedure, risks, benefits, and alternatives were explained to the patient. Questions regarding the procedure were encouraged and answered. The patient understands and consents  to the procedure. A time-out was performed prior to initiating the procedure. Ultrasound was utilized to confirm patency of the left internal jugular vein. A permanent ultrasound image was saved and recorded. The left neck and chest were prepped with chlorhexidine in a sterile fashion, and a sterile drape was applied covering the operative field. Maximum barrier sterile technique with sterile gowns and gloves were used for the procedure. Local anesthesia was provided with 1% lidocaine. After creating a small venotomy incision, a 21 gauge needle was advanced into the left internal jugular vein under direct, real-time ultrasound guidance. Ultrasound image documentation was performed. After securing guidewire access, an 8 Fr dilator was placed. A J-wire was kinked to measure appropriate catheter length. A subcutaneous port pocket was then created along the upper chest wall utilizing sharp and blunt dissection. Portable cautery was utilized. The pocket was irrigated with sterile saline. A single lumen power injectable port was chosen for placement. The 8 Fr catheter was tunneled from the port pocket site to the venotomy incision. The port was placed in the pocket. External catheter was trimmed to appropriate length based on guidewire measurement. At the venotomy, an 8 Fr peel-away sheath was placed over a guidewire. The catheter was then placed through the sheath and the sheath removed. Final catheter positioning was confirmed and documented with a fluoroscopic spot image. The port was accessed with a needle and aspirated and flushed with heparinized saline. The access needle was removed. The venotomy and port pocket incisions were closed with subcutaneous 3-0 Monocryl and subcuticular 4-0 Vicryl. Dermabond was applied to both incisions.  COMPLICATIONS: COMPLICATIONS None FINDINGS: After catheter placement, the tip lies at the cavo-atrial junction. The catheter aspirates normally and is ready for immediate use. IMPRESSION: Placement of single lumen port a cath via left internal jugular vein. The catheter tip lies at the cavo-atrial junction. A power injectable port a cath was placed and is ready for immediate use. Electronically Signed   By: Irish Lack M.D.   On: 03/29/2023 15:52    ASSESSMENT: Stage IIIa adenocarcinoma of the right upper lobe lung.  PLAN:    Stage IIIa adenocarcinoma of the right upper lobe lung: Pathology and imaging reviewed independently confirming diagnosis and stage of disease.  PET scan results from February 14, 2023 reviewed independently with no obvious evidence of metastatic disease.  MRI of the brain from February 11, 2023 also negative for metastatic disease.  Surgical resection was not an option, therefore we will proceed with concurrent XRT along with weekly carboplatin and Taxol. This will be followed by maintenance durvalumab every 2 weeks for 1 year.  Patient has had port placement.  Despite hypotension, will proceed with cycle 1 of carboplatin and Taxol today.  His treatment plan includes 1 L of IV fluids.  Continue with daily XRT.  Return to clinic in 1 week for further evaluation and consideration of cycle 2.   Chest pain: Secondary to malignancy.  Continue oxycodone as needed.  Patient was also given a prescription for dexamethasone today.   Iron deficiency anemia: Hemoglobin has trended down to 10.7 and previously patient was noted to have reduced iron stores.  Can consider IV iron in the near future. Leukocytosis: Likely reactive, monitor. Weight loss: Patient was given a referral to dietary today.  Dexamethasone as above.  Patient expressed understanding and was in agreement with this plan. He also understands that He can call clinic at any time with any questions, concerns, or complaints.    Cancer  Staging  Adenocarcinoma of upper lobe of right lung [facility]) Staging form: Lung, AJCC 8th Edition - Clinical stage from 03/15/2023: Stage IIIA (cT4, cN0, cM0) - Signed by Jeralyn Ruths, MD on 03/15/2023 Stage prefix: Initial diagnosis   Jeralyn Ruths, MD   04/05/2023 12:24 PM

## 2023-04-06 ENCOUNTER — Ambulatory Visit
Admission: RE | Admit: 2023-04-06 | Discharge: 2023-04-06 | Disposition: A | Discharge status: Home / Self Care | Payer: Medicare HMO | Source: Ambulatory Visit | Attending: Radiation Oncology | Admitting: Radiation Oncology

## 2023-04-06 ENCOUNTER — Other Ambulatory Visit: Payer: Self-pay

## 2023-04-06 DIAGNOSIS — R42 Dizziness and giddiness: Secondary | ICD-10-CM | POA: Diagnosis not present

## 2023-04-06 DIAGNOSIS — Z5111 Encounter for antineoplastic chemotherapy: Secondary | ICD-10-CM | POA: Diagnosis not present

## 2023-04-06 DIAGNOSIS — Z510 Encounter for antineoplastic radiation therapy: Secondary | ICD-10-CM | POA: Diagnosis not present

## 2023-04-06 DIAGNOSIS — Z8673 Personal history of transient ischemic attack (TIA), and cerebral infarction without residual deficits: Secondary | ICD-10-CM | POA: Diagnosis not present

## 2023-04-06 DIAGNOSIS — R634 Abnormal weight loss: Secondary | ICD-10-CM | POA: Diagnosis not present

## 2023-04-06 DIAGNOSIS — Z87891 Personal history of nicotine dependence: Secondary | ICD-10-CM | POA: Diagnosis not present

## 2023-04-06 DIAGNOSIS — C3411 Malignant neoplasm of upper lobe, right bronchus or lung: Secondary | ICD-10-CM | POA: Diagnosis not present

## 2023-04-06 DIAGNOSIS — R079 Chest pain, unspecified: Secondary | ICD-10-CM | POA: Diagnosis not present

## 2023-04-06 DIAGNOSIS — J449 Chronic obstructive pulmonary disease, unspecified: Secondary | ICD-10-CM | POA: Diagnosis not present

## 2023-04-06 DIAGNOSIS — R531 Weakness: Secondary | ICD-10-CM | POA: Diagnosis not present

## 2023-04-06 LAB — RAD ONC ARIA SESSION SUMMARY
Course Elapsed Days: 0
Plan Fractions Treated to Date: 1
Plan Prescribed Dose Per Fraction: 2 Gy
Plan Total Fractions Prescribed: 35
Plan Total Prescribed Dose: 70 Gy
Reference Point Dosage Given to Date: 2 Gy
Reference Point Session Dosage Given: 2 Gy
Session Number: 1

## 2023-04-07 ENCOUNTER — Other Ambulatory Visit: Payer: Self-pay

## 2023-04-07 ENCOUNTER — Ambulatory Visit
Admission: RE | Admit: 2023-04-07 | Discharge: 2023-04-07 | Disposition: A | Discharge status: Home / Self Care | Payer: Medicare HMO | Source: Ambulatory Visit | Attending: Radiation Oncology | Admitting: Radiation Oncology

## 2023-04-07 DIAGNOSIS — D509 Iron deficiency anemia, unspecified: Secondary | ICD-10-CM | POA: Diagnosis not present

## 2023-04-07 DIAGNOSIS — Z8673 Personal history of transient ischemic attack (TIA), and cerebral infarction without residual deficits: Secondary | ICD-10-CM | POA: Insufficient documentation

## 2023-04-07 DIAGNOSIS — J432 Centrilobular emphysema: Secondary | ICD-10-CM | POA: Insufficient documentation

## 2023-04-07 DIAGNOSIS — E860 Dehydration: Secondary | ICD-10-CM | POA: Insufficient documentation

## 2023-04-07 DIAGNOSIS — Z79899 Other long term (current) drug therapy: Secondary | ICD-10-CM | POA: Diagnosis not present

## 2023-04-07 DIAGNOSIS — N200 Calculus of kidney: Secondary | ICD-10-CM | POA: Insufficient documentation

## 2023-04-07 DIAGNOSIS — D72829 Elevated white blood cell count, unspecified: Secondary | ICD-10-CM | POA: Insufficient documentation

## 2023-04-07 DIAGNOSIS — G893 Neoplasm related pain (acute) (chronic): Secondary | ICD-10-CM | POA: Diagnosis not present

## 2023-04-07 DIAGNOSIS — J449 Chronic obstructive pulmonary disease, unspecified: Secondary | ICD-10-CM | POA: Diagnosis not present

## 2023-04-07 DIAGNOSIS — Z8616 Personal history of COVID-19: Secondary | ICD-10-CM | POA: Diagnosis not present

## 2023-04-07 DIAGNOSIS — R918 Other nonspecific abnormal finding of lung field: Secondary | ICD-10-CM | POA: Diagnosis not present

## 2023-04-07 DIAGNOSIS — R079 Chest pain, unspecified: Secondary | ICD-10-CM | POA: Diagnosis not present

## 2023-04-07 DIAGNOSIS — Z800 Family history of malignant neoplasm of digestive organs: Secondary | ICD-10-CM | POA: Insufficient documentation

## 2023-04-07 DIAGNOSIS — R531 Weakness: Secondary | ICD-10-CM | POA: Insufficient documentation

## 2023-04-07 DIAGNOSIS — Z5111 Encounter for antineoplastic chemotherapy: Secondary | ICD-10-CM | POA: Insufficient documentation

## 2023-04-07 DIAGNOSIS — R630 Anorexia: Secondary | ICD-10-CM | POA: Insufficient documentation

## 2023-04-07 DIAGNOSIS — K5900 Constipation, unspecified: Secondary | ICD-10-CM | POA: Insufficient documentation

## 2023-04-07 DIAGNOSIS — I700 Atherosclerosis of aorta: Secondary | ICD-10-CM | POA: Insufficient documentation

## 2023-04-07 DIAGNOSIS — K8020 Calculus of gallbladder without cholecystitis without obstruction: Secondary | ICD-10-CM | POA: Diagnosis not present

## 2023-04-07 DIAGNOSIS — Z7982 Long term (current) use of aspirin: Secondary | ICD-10-CM | POA: Insufficient documentation

## 2023-04-07 DIAGNOSIS — R634 Abnormal weight loss: Secondary | ICD-10-CM | POA: Diagnosis not present

## 2023-04-07 DIAGNOSIS — Z791 Long term (current) use of non-steroidal anti-inflammatories (NSAID): Secondary | ICD-10-CM | POA: Insufficient documentation

## 2023-04-07 DIAGNOSIS — I2510 Atherosclerotic heart disease of native coronary artery without angina pectoris: Secondary | ICD-10-CM | POA: Insufficient documentation

## 2023-04-07 DIAGNOSIS — C3411 Malignant neoplasm of upper lobe, right bronchus or lung: Secondary | ICD-10-CM | POA: Diagnosis not present

## 2023-04-07 DIAGNOSIS — Z87891 Personal history of nicotine dependence: Secondary | ICD-10-CM | POA: Insufficient documentation

## 2023-04-07 DIAGNOSIS — Z510 Encounter for antineoplastic radiation therapy: Secondary | ICD-10-CM | POA: Diagnosis not present

## 2023-04-07 DIAGNOSIS — Z7952 Long term (current) use of systemic steroids: Secondary | ICD-10-CM | POA: Insufficient documentation

## 2023-04-07 DIAGNOSIS — I959 Hypotension, unspecified: Secondary | ICD-10-CM | POA: Insufficient documentation

## 2023-04-07 DIAGNOSIS — N529 Male erectile dysfunction, unspecified: Secondary | ICD-10-CM | POA: Insufficient documentation

## 2023-04-07 DIAGNOSIS — R42 Dizziness and giddiness: Secondary | ICD-10-CM | POA: Diagnosis not present

## 2023-04-07 LAB — RAD ONC ARIA SESSION SUMMARY
Course Elapsed Days: 1
Plan Fractions Treated to Date: 2
Plan Prescribed Dose Per Fraction: 2 Gy
Plan Total Fractions Prescribed: 35
Plan Total Prescribed Dose: 70 Gy
Reference Point Dosage Given to Date: 4 Gy
Reference Point Session Dosage Given: 2 Gy
Session Number: 2

## 2023-04-08 ENCOUNTER — Ambulatory Visit: Payer: Medicare HMO

## 2023-04-08 ENCOUNTER — Telehealth: Payer: Self-pay

## 2023-04-08 NOTE — Telephone Encounter (Signed)
Patient wife called stating that patient has had a bad night last night. He was up most of the night with restless legs. Patient is unable to make his radiation appointment today because of the restless legs and being up all night. Wife states he is having trouble walking even with his walker.

## 2023-04-11 ENCOUNTER — Inpatient Hospital Stay (HOSPITAL_BASED_OUTPATIENT_CLINIC_OR_DEPARTMENT_OTHER): Payer: Medicare HMO | Admitting: Hospice and Palliative Medicine

## 2023-04-11 ENCOUNTER — Encounter: Payer: Self-pay | Admitting: Oncology

## 2023-04-11 ENCOUNTER — Ambulatory Visit
Admission: RE | Admit: 2023-04-11 | Discharge: 2023-04-11 | Disposition: A | Discharge status: Home / Self Care | Payer: Medicare HMO | Source: Ambulatory Visit | Attending: Radiation Oncology | Admitting: Radiation Oncology

## 2023-04-11 ENCOUNTER — Other Ambulatory Visit: Payer: Self-pay

## 2023-04-11 ENCOUNTER — Inpatient Hospital Stay: Payer: Medicare HMO

## 2023-04-11 ENCOUNTER — Other Ambulatory Visit: Payer: Self-pay | Admitting: *Deleted

## 2023-04-11 ENCOUNTER — Encounter: Payer: Self-pay | Admitting: Hospice and Palliative Medicine

## 2023-04-11 VITALS — BP 109/72 | HR 90 | Temp 98.4°F | Resp 20

## 2023-04-11 VITALS — BP 100/61 | HR 105 | Temp 99.7°F | Resp 16

## 2023-04-11 DIAGNOSIS — R634 Abnormal weight loss: Secondary | ICD-10-CM | POA: Diagnosis not present

## 2023-04-11 DIAGNOSIS — Z8616 Personal history of COVID-19: Secondary | ICD-10-CM | POA: Insufficient documentation

## 2023-04-11 DIAGNOSIS — E860 Dehydration: Secondary | ICD-10-CM | POA: Insufficient documentation

## 2023-04-11 DIAGNOSIS — D72829 Elevated white blood cell count, unspecified: Secondary | ICD-10-CM | POA: Insufficient documentation

## 2023-04-11 DIAGNOSIS — I959 Hypotension, unspecified: Secondary | ICD-10-CM | POA: Insufficient documentation

## 2023-04-11 DIAGNOSIS — R630 Anorexia: Secondary | ICD-10-CM | POA: Insufficient documentation

## 2023-04-11 DIAGNOSIS — Z5111 Encounter for antineoplastic chemotherapy: Secondary | ICD-10-CM | POA: Insufficient documentation

## 2023-04-11 DIAGNOSIS — C3411 Malignant neoplasm of upper lobe, right bronchus or lung: Secondary | ICD-10-CM

## 2023-04-11 DIAGNOSIS — Z791 Long term (current) use of non-steroidal anti-inflammatories (NSAID): Secondary | ICD-10-CM | POA: Insufficient documentation

## 2023-04-11 DIAGNOSIS — D509 Iron deficiency anemia, unspecified: Secondary | ICD-10-CM | POA: Insufficient documentation

## 2023-04-11 DIAGNOSIS — Z7982 Long term (current) use of aspirin: Secondary | ICD-10-CM | POA: Insufficient documentation

## 2023-04-11 DIAGNOSIS — Z8673 Personal history of transient ischemic attack (TIA), and cerebral infarction without residual deficits: Secondary | ICD-10-CM | POA: Insufficient documentation

## 2023-04-11 DIAGNOSIS — R531 Weakness: Secondary | ICD-10-CM

## 2023-04-11 DIAGNOSIS — K5900 Constipation, unspecified: Secondary | ICD-10-CM | POA: Insufficient documentation

## 2023-04-11 DIAGNOSIS — J449 Chronic obstructive pulmonary disease, unspecified: Secondary | ICD-10-CM | POA: Insufficient documentation

## 2023-04-11 DIAGNOSIS — Z510 Encounter for antineoplastic radiation therapy: Secondary | ICD-10-CM | POA: Diagnosis not present

## 2023-04-11 DIAGNOSIS — Z7952 Long term (current) use of systemic steroids: Secondary | ICD-10-CM | POA: Insufficient documentation

## 2023-04-11 DIAGNOSIS — F17210 Nicotine dependence, cigarettes, uncomplicated: Secondary | ICD-10-CM | POA: Insufficient documentation

## 2023-04-11 DIAGNOSIS — E440 Moderate protein-calorie malnutrition: Secondary | ICD-10-CM | POA: Diagnosis not present

## 2023-04-11 DIAGNOSIS — R079 Chest pain, unspecified: Secondary | ICD-10-CM | POA: Insufficient documentation

## 2023-04-11 DIAGNOSIS — R42 Dizziness and giddiness: Secondary | ICD-10-CM | POA: Diagnosis not present

## 2023-04-11 DIAGNOSIS — Z79899 Other long term (current) drug therapy: Secondary | ICD-10-CM | POA: Insufficient documentation

## 2023-04-11 DIAGNOSIS — Z87891 Personal history of nicotine dependence: Secondary | ICD-10-CM | POA: Diagnosis not present

## 2023-04-11 DIAGNOSIS — G893 Neoplasm related pain (acute) (chronic): Secondary | ICD-10-CM | POA: Insufficient documentation

## 2023-04-11 DIAGNOSIS — N529 Male erectile dysfunction, unspecified: Secondary | ICD-10-CM | POA: Insufficient documentation

## 2023-04-11 LAB — CMP (CANCER CENTER ONLY)
ALT: 33 U/L (ref 0–44)
AST: 35 U/L (ref 15–41)
Albumin: 2.6 g/dL — ABNORMAL LOW (ref 3.5–5.0)
Alkaline Phosphatase: 99 U/L (ref 38–126)
Anion gap: 9 (ref 5–15)
BUN: 21 mg/dL (ref 8–23)
CO2: 22 mmol/L (ref 22–32)
Calcium: 9.2 mg/dL (ref 8.9–10.3)
Chloride: 100 mmol/L (ref 98–111)
Creatinine: 0.92 mg/dL (ref 0.61–1.24)
GFR, Estimated: >60 mL/min (ref >60)
Glucose, Bld: 160 mg/dL — ABNORMAL HIGH (ref 70–99)
Potassium: 4 mmol/L (ref 3.5–5.1)
Sodium: 131 mmol/L — ABNORMAL LOW (ref 135–145)
Total Bilirubin: 0.7 mg/dL (ref 0.3–1.2)
Total Protein: 6.8 g/dL (ref 6.5–8.1)

## 2023-04-11 LAB — RAD ONC ARIA SESSION SUMMARY
Course Elapsed Days: 5
Plan Fractions Treated to Date: 3
Plan Prescribed Dose Per Fraction: 2 Gy
Plan Total Fractions Prescribed: 35
Plan Total Prescribed Dose: 70 Gy
Reference Point Dosage Given to Date: 6 Gy
Reference Point Session Dosage Given: 2 Gy
Session Number: 3

## 2023-04-11 LAB — CBC WITH DIFFERENTIAL (CANCER CENTER ONLY)
Abs Immature Granulocytes: 0.12 10*3/uL — ABNORMAL HIGH (ref 0.00–0.07)
Basophils Absolute: 0.1 10*3/uL (ref 0.0–0.1)
Basophils Relative: 0 %
Eosinophils Absolute: 0.1 10*3/uL (ref 0.0–0.5)
Eosinophils Relative: 1 %
HCT: 30.6 % — ABNORMAL LOW (ref 39.0–52.0)
Hemoglobin: 9.7 g/dL — ABNORMAL LOW (ref 13.0–17.0)
Immature Granulocytes: 1 %
Lymphocytes Relative: 11 %
Lymphs Abs: 1.3 10*3/uL (ref 0.7–4.0)
MCH: 27 pg (ref 26.0–34.0)
MCHC: 31.7 g/dL (ref 30.0–36.0)
MCV: 85.2 fL (ref 80.0–100.0)
Monocytes Absolute: 1.1 10*3/uL — ABNORMAL HIGH (ref 0.1–1.0)
Monocytes Relative: 10 %
Neutro Abs: 8.7 10*3/uL — ABNORMAL HIGH (ref 1.7–7.7)
Neutrophils Relative %: 77 %
Platelet Count: 404 10*3/uL — ABNORMAL HIGH (ref 150–400)
RBC: 3.59 MIL/uL — ABNORMAL LOW (ref 4.22–5.81)
RDW: 15.9 % — ABNORMAL HIGH (ref 11.5–15.5)
WBC Count: 11.3 10*3/uL — ABNORMAL HIGH (ref 4.0–10.5)
nRBC: 0 % (ref 0.0–0.2)

## 2023-04-11 MED ORDER — SODIUM CHLORIDE 0.9% FLUSH
10.0000 mL | Freq: Once | INTRAVENOUS | Status: AC
  Administered 2023-04-11: 10 mL via INTRAVENOUS
  Filled 2023-04-11: qty 10

## 2023-04-11 MED ORDER — SODIUM CHLORIDE 0.9 % IV SOLN
INTRAVENOUS | Status: DC
  Filled 2023-04-11 (×2): qty 250

## 2023-04-11 MED ORDER — HEPARIN SOD (PORK) LOCK FLUSH 100 UNIT/ML IV SOLN
500.0000 [IU] | Freq: Once | INTRAVENOUS | Status: AC
  Administered 2023-04-11: 500 [IU] via INTRAVENOUS
  Filled 2023-04-11: qty 5

## 2023-04-11 MED FILL — Dexamethasone Sodium Phosphate Inj 100 MG/10ML: INTRAMUSCULAR | Qty: 1 | Status: AC

## 2023-04-11 NOTE — Progress Notes (Signed)
Symptom Management Clinic [facility] at [facility] Telephone:([phone] Fax:([phone]) [phone]  Patient Care Team: Smitty Cords, DO as PCP - General (Family Medicine) Micki Riley, MD as Referring Physician (Neurology) Glory Buff, RN as Oncology Nurse Navigator   NAME OF PATIENT: Ethan Fitzgerald  [id number]  07-06-1956   DATE OF VISIT: 04/11/23  REASON FOR CONSULT: Ethan Fitzgerald is a 67 y.o. male with multiple medical problems including stage III adenocarcinoma of the right upper lobe lung.  Patient on treatment with concurrent chemoradiation.  INTERVAL HISTORY: Patient received cycle 1 CarboTaxol on 04/05/2023.  He is on daily XRT.  Patient presented to radiation oncology and was feeling weak and fatigued and was therefore sent to [facility] for evaluation.  Patient was last seen by Dr. Orlie Dakin on 04/05/2023 at which time patient was complaining of pain, fatigue and poor oral intake.  Patient was started on dexamethasone daily as an appetite stimulant to see if this would help with patient's pain.  Today, patient denies specific complaints other than just feeling fatigued.  Does endorse poor oral intake with minimal food/fluids.  Denies fever or chills.  No cough, congestion, or shortness of breath.  No nausea, vomiting, diarrhea, or constipation.  His pain is reasonably well-managed on oxycodone. Denies urinary complaints. Patient offers no further specific complaints today.   PAST MEDICAL HISTORY: Past Medical History:  Diagnosis Date   COPD (chronic obstructive pulmonary disease) (HCC)    mild   COVID-19 09/2019   Depression    Erectile dysfunction    Headache    daily, stress headaches   Knee pain, left    Seasonal allergies    Stroke (HCC)    Tobacco dependence     PAST SURGICAL HISTORY:  Past Surgical History:  Procedure Laterality Date   BACK SURGERY     metal plate in back   COLONOSCOPY     COLONOSCOPY WITH PROPOFOL N/A  04/26/2019   Procedure: COLONOSCOPY WITH PROPOFOL;  Surgeon: Wyline Mood, MD;  Location: [facility] ENDOSCOPY;  Service: Gastroenterology;  Laterality: N/A;   FINE NEEDLE ASPIRATION  03/03/2023   Procedure: FINE NEEDLE ASPIRATION;  Surgeon: Raechel Chute, MD;  Location: [facility] ENDOSCOPY;  Service: Pulmonary;;   HERNIA REPAIR Right    IR ANGIO INTRA EXTRACRAN SEL COM CAROTID INNOMINATE UNI L MOD SED  03/27/2020   IR ANGIO VERTEBRAL SEL SUBCLAVIAN INNOMINATE UNI L MOD SED  03/27/2020   IR ANGIO VERTEBRAL SEL VERTEBRAL UNI R MOD SED  03/27/2020   IR CT HEAD LTD  03/27/2020   IR IMAGING GUIDED PORT INSERTION  03/29/2023   IR PERCUTANEOUS ART THROMBECTOMY/INFUSION INTRACRANIAL INC DIAG ANGIO  03/27/2020   KNEE ARTHROSCOPY Left 03/28/2015   Procedure: Arthroscopic partial medial meniscectomy plus chondral debridement;  Surgeon: Erin Sons, MD;  Location: [facility];  Service: Orthopedics;  Laterality: Left;   RADIOLOGY WITH ANESTHESIA N/A 03/27/2020   Procedure: IR WITH ANESTHESIA;  Surgeon: Julieanne Cotton, MD;  Location: [facility] OR;  Service: Radiology;  Laterality: N/A;   VIDEO BRONCHOSCOPY WITH ENDOBRONCHIAL ULTRASOUND  03/03/2023   Procedure: VIDEO BRONCHOSCOPY WITH ENDOBRONCHIAL ULTRASOUND;  Surgeon: Raechel Chute, MD;  Location: [facility] ENDOSCOPY;  Service: Pulmonary;;    HEMATOLOGY/ONCOLOGY HISTORY:  Oncology History  Adenocarcinoma of upper lobe of right lung (HCC)  03/15/2023 Initial Diagnosis   Adenocarcinoma of upper lobe of right lung (HCC)   03/15/2023 Cancer Staging   Staging form: Lung, AJCC 8th Edition - Clinical stage from 03/15/2023: Stage IIIA (  cT4, cN0, cM0) - Signed by Jeralyn Ruths, MD on 03/15/2023 Stage prefix: Initial diagnosis   04/05/2023 -  Chemotherapy   Patient is on Treatment Plan : LUNG Carboplatin + Paclitaxel + XRT q7d       ALLERGIES:  has No Known Allergies.  MEDICATIONS:  Current Outpatient Medications  Medication Sig Dispense Refill   albuterol (VENTOLIN HFA)  108 (90 Base) MCG/ACT inhaler INHALE 1 TO 2 PUFFS EVERY 4 HOURS AS NEEDED FOR WHEEZING OR SHORTNESS OF BREATH 1 each 2   aspirin EC 325 MG EC tablet Take 1 tablet (325 mg total) by mouth daily. (Patient not taking: Reported on 04/05/2023) 90 tablet 0   b complex vitamins capsule Take 1 capsule by mouth daily.     Cholecalciferol (VITAMIN D) 50 MCG (2000 UT) tablet Take 2,000 Units by mouth daily.     dexamethasone (DECADRON) 4 MG tablet Take 1 tablet (4 mg total) by mouth daily. 30 tablet 2   diphenhydrAMINE (BENADRYL) 50 MG tablet Take 50-100 mg by mouth at bedtime as needed for sleep.     fludrocortisone (FLORINEF) 0.1 MG tablet Take 0.1 mg by mouth daily.     fluticasone (FLONASE) 50 MCG/ACT nasal spray Place 2 sprays into both nostrils daily. (Patient taking differently: Place 2 sprays into both nostrils daily as needed for allergies.) 48 g 3   lidocaine-prilocaine (EMLA) cream Apply to affected area once 30 g 3   loratadine (CLARITIN) 10 MG tablet Take 1 tablet (10 mg total) by mouth daily. Use for 4-6 weeks then stop, and use as needed or seasonally (Patient taking differently: Take 10 mg by mouth daily as needed for allergies.) 30 tablet 11   melatonin 5 MG TABS Take 5 mg by mouth at bedtime as needed (sleep).     methocarbamol (ROBAXIN) 500 MG tablet Take 1 tablet (500 mg total) by mouth 4 (four) times daily. (Patient not taking: Reported on 04/05/2023) 20 tablet 0   midodrine (PROAMATINE) 5 MG tablet Take 10 mg by mouth 3 (three) times daily with meals.     Multiple Vitamin (MULTIVITAMIN WITH MINERALS) TABS tablet Take 1 tablet by mouth daily.     Multiple Vitamins-Minerals (IMMUNE SUPPORT VITAMIN C PO) Take 1 tablet by mouth daily.     naproxen (NAPROSYN) 500 MG tablet Take 1 tablet (500 mg total) by mouth 2 (two) times daily with a meal. 10 tablet 0   ondansetron (ZOFRAN) 8 MG tablet Take 1 tablet (8 mg total) by mouth every 8 (eight) hours as needed for nausea or vomiting. Start on the  third day after chemotherapy. (Patient not taking: Reported on 04/05/2023) 60 tablet 2   OVER THE COUNTER MEDICATION Take 5 mLs by mouth every other day. Mary [employer]'s vegan liquid iron     Oxycodone HCl 10 MG TABS Take 1 tablet (10 mg total) by mouth every 6 (six) hours as needed. 60 tablet 0   prochlorperazine (COMPAZINE) 10 MG tablet Take 1 tablet (10 mg total) by mouth every 6 (six) hours as needed for nausea or vomiting. (Patient not taking: Reported on 04/05/2023) 60 tablet 2   rosuvastatin (CRESTOR) 40 MG tablet Take 1 tablet (40 mg total) by mouth every other day. 45 tablet 3   sildenafil (REVATIO) 20 MG tablet TAKE 1 TO 5 TABLETS BY MOUTH ABOUT 30 MINUTES PRIOR TO SEX.START WITH 1 THEN INCREASE 30 tablet 5   STIOLTO RESPIMAT 2.5-2.5 MCG/ACT AERS INHALE 2 PUFFS INTO THE LUNGS DAILY. 12  g 3   No current facility-administered medications for this visit.    VITAL SIGNS: There were no vitals taken for this visit. There were no vitals filed for this visit.  Estimated body mass index is 18.91 kg/m as calculated from the following:   Height as of 04/05/23: 6' (1.829 m).   Weight as of 04/05/23: 139 lb 6.4 oz (63.2 kg).  LABS: CBC:    Component Value Date/Time   WBC 13.6 (H) 04/05/2023 0828   HGB 10.7 (L) 04/05/2023 0828   HGB 10.9 (L) 03/29/2023 1312   HCT 34.0 (L) 04/05/2023 0828   PLT 602 (H) 04/05/2023 0828   PLT 551 (H) 03/29/2023 1312   MCV 84.8 04/05/2023 0828   NEUTROABS 10.1 (H) 04/05/2023 0828   LYMPHSABS 1.9 04/05/2023 0828   MONOABS 1.2 (H) 04/05/2023 0828   EOSABS 0.3 04/05/2023 0828   BASOSABS 0.1 04/05/2023 0828   Comprehensive Metabolic Panel:    Component Value Date/Time   NA 133 (L) 04/05/2023 0848   K 3.6 04/05/2023 0848   CL 104 04/05/2023 0848   CO2 21 (L) 04/05/2023 0848   BUN 16 04/05/2023 0848   CREATININE 0.88 04/05/2023 0848   CREATININE 0.92 12/22/2022 0808   GLUCOSE 118 (H) 04/05/2023 0848   CALCIUM 9.6 04/05/2023 0848   AST 25 04/05/2023 0848    ALT 24 04/05/2023 0848   ALKPHOS 99 04/05/2023 0848   BILITOT 0.6 04/05/2023 0848   PROT 7.0 04/05/2023 0848   ALBUMIN 2.8 (L) 04/05/2023 0848    RADIOGRAPHIC STUDIES: IR IMAGING GUIDED PORT INSERTION  Result Date: 03/29/2023 CLINICAL DATA:  Right upper lobe lung adenocarcinoma and need for porta cath to begin chemotherapy. EXAM: IMPLANTED PORT A CATH PLACEMENT WITH ULTRASOUND AND FLUOROSCOPIC GUIDANCE ANESTHESIA/SEDATION: Moderate (conscious) sedation was employed during this procedure. A total of Versed 2.0 mg and Fentanyl 100 mcg was administered intravenously by radiology nursing. Moderate Sedation Time: 37 minutes. The patient's level of consciousness and vital signs were monitored continuously by radiology nursing throughout the procedure under my direct supervision. FLUOROSCOPY: 1.0 mGy PROCEDURE: The procedure, risks, benefits, and alternatives were explained to the patient. Questions regarding the procedure were encouraged and answered. The patient understands and consents to the procedure. A time-out was performed prior to initiating the procedure. Ultrasound was utilized to confirm patency of the left internal jugular vein. A permanent ultrasound image was saved and recorded. The left neck and chest were prepped with chlorhexidine in a sterile fashion, and a sterile drape was applied covering the operative field. Maximum barrier sterile technique with sterile gowns and gloves were used for the procedure. Local anesthesia was provided with 1% lidocaine. After creating a small venotomy incision, a 21 gauge needle was advanced into the left internal jugular vein under direct, real-time ultrasound guidance. Ultrasound image documentation was performed. After securing guidewire access, an 8 Fr dilator was placed. A J-wire was kinked to measure appropriate catheter length. A subcutaneous port pocket was then created along the upper chest wall utilizing sharp and blunt dissection. Portable cautery was  utilized. The pocket was irrigated with sterile saline. A single lumen power injectable port was chosen for placement. The 8 Fr catheter was tunneled from the port pocket site to the venotomy incision. The port was placed in the pocket. External catheter was trimmed to appropriate length based on guidewire measurement. At the venotomy, an 8 Fr peel-away sheath was placed over a guidewire. The catheter was then placed through the sheath and the sheath  removed. Final catheter positioning was confirmed and documented with a fluoroscopic spot image. The port was accessed with a needle and aspirated and flushed with heparinized saline. The access needle was removed. The venotomy and port pocket incisions were closed with subcutaneous 3-0 Monocryl and subcuticular 4-0 Vicryl. Dermabond was applied to both incisions. COMPLICATIONS: COMPLICATIONS None FINDINGS: After catheter placement, the tip lies at the cavo-atrial junction. The catheter aspirates normally and is ready for immediate use. IMPRESSION: Placement of single lumen port a cath via left internal jugular vein. The catheter tip lies at the cavo-atrial junction. A power injectable port a cath was placed and is ready for immediate use. Electronically Signed   By: Irish Lack M.D.   On: 03/29/2023 15:52    PERFORMANCE STATUS (ECOG) : 2 - Symptomatic, <50% confined to bed  Review of Systems Unless otherwise noted, a complete review of systems is negative.  Physical Exam General: NAD Cardiovascular: regular rate and rhythm Pulmonary: clear ant fields Abdomen: soft, nontender, + bowel sounds GU: no suprapubic tenderness Extremities: no edema, no joint deformities Skin: no rashes Neurological: Weakness but otherwise nonfocal  IMPRESSION/PLAN: Stage IIIa adenocarcinoma of the right upper lobe lung -status post cycle 1 weekly CarboTaxol.  Patient also receiving concurrent radiation.  He has felt increased fatigue with poor oral intake since starting  cancer treatment.  Dehydration -proceed with IV fluids today  Poor oral intake -patient pending assessment with nutritionist.  Patient prescribed dexamethasone by Dr. Orlie Dakin but has not yet started it.  Patient encouraged to start taking it as it may help with both appetite and fatigue.  Patient will eventually require weaning as plan is for future maintenance immunotherapy.  Discussed oral nutritional supplements.  Weakness -referral to [facility]. Discussed fall prevention strategies.  Patient is using walker to ambulate at home.  Patient returns tomorrow for consideration of cycle 2 CarboTaxol   Patient expressed understanding and was in agreement with this plan. He also understands that He can call clinic at any time with any questions, concerns, or complaints.   Thank you for allowing me to participate in the care of this very pleasant patient.   Time Total: 20 minutes  Visit consisted of counseling and education dealing with the complex and emotionally intense issues of symptom management in the setting of serious illness.Greater than 50%  of this time was spent counseling and coordinating care related to the above assessment and plan.  Signed by: Laurette Schimke, PhD, NP-C

## 2023-04-11 NOTE — Progress Notes (Signed)
Per Lawson Fiscal in radiation- patient feels weak,shaky and reports decrease in appetite. Vitals in radiation: temp 99.7, resp 16, HR 105, O2 98%, BP 100/61 pt added to smc.

## 2023-04-11 NOTE — Progress Notes (Signed)
Port was put in on 03/29/2023. After first radiation treatment, patient had restless legs so he couldn't sleep. Appetite hasn't been great. Nausea, and vomiting, plus constipation.

## 2023-04-12 ENCOUNTER — Inpatient Hospital Stay: Payer: Medicare HMO

## 2023-04-12 ENCOUNTER — Other Ambulatory Visit: Payer: Self-pay

## 2023-04-12 ENCOUNTER — Ambulatory Visit
Admission: RE | Admit: 2023-04-12 | Discharge: 2023-04-12 | Disposition: A | Discharge status: Home / Self Care | Payer: Medicare HMO | Source: Ambulatory Visit | Attending: Radiation Oncology | Admitting: Radiation Oncology

## 2023-04-12 ENCOUNTER — Inpatient Hospital Stay (HOSPITAL_BASED_OUTPATIENT_CLINIC_OR_DEPARTMENT_OTHER): Payer: Medicare HMO | Admitting: Nurse Practitioner

## 2023-04-12 ENCOUNTER — Ambulatory Visit: Payer: Medicare HMO

## 2023-04-12 ENCOUNTER — Encounter: Payer: Self-pay | Admitting: Nurse Practitioner

## 2023-04-12 VITALS — BP 90/64 | HR 95 | Temp 99.2°F | Wt 141.0 lb

## 2023-04-12 VITALS — BP 118/88 | HR 77

## 2023-04-12 DIAGNOSIS — T402X5A Adverse effect of other opioids, initial encounter: Secondary | ICD-10-CM | POA: Diagnosis not present

## 2023-04-12 DIAGNOSIS — I951 Orthostatic hypotension: Secondary | ICD-10-CM | POA: Diagnosis not present

## 2023-04-12 DIAGNOSIS — Z5111 Encounter for antineoplastic chemotherapy: Secondary | ICD-10-CM

## 2023-04-12 DIAGNOSIS — G893 Neoplasm related pain (acute) (chronic): Secondary | ICD-10-CM

## 2023-04-12 DIAGNOSIS — R609 Edema, unspecified: Secondary | ICD-10-CM | POA: Diagnosis not present

## 2023-04-12 DIAGNOSIS — R079 Chest pain, unspecified: Secondary | ICD-10-CM | POA: Diagnosis not present

## 2023-04-12 DIAGNOSIS — C3411 Malignant neoplasm of upper lobe, right bronchus or lung: Secondary | ICD-10-CM | POA: Diagnosis not present

## 2023-04-12 DIAGNOSIS — D509 Iron deficiency anemia, unspecified: Secondary | ICD-10-CM

## 2023-04-12 DIAGNOSIS — R634 Abnormal weight loss: Secondary | ICD-10-CM | POA: Diagnosis not present

## 2023-04-12 DIAGNOSIS — Z510 Encounter for antineoplastic radiation therapy: Secondary | ICD-10-CM | POA: Diagnosis not present

## 2023-04-12 DIAGNOSIS — J449 Chronic obstructive pulmonary disease, unspecified: Secondary | ICD-10-CM | POA: Diagnosis not present

## 2023-04-12 DIAGNOSIS — K5903 Drug induced constipation: Secondary | ICD-10-CM

## 2023-04-12 DIAGNOSIS — Z87891 Personal history of nicotine dependence: Secondary | ICD-10-CM | POA: Diagnosis not present

## 2023-04-12 DIAGNOSIS — Z8673 Personal history of transient ischemic attack (TIA), and cerebral infarction without residual deficits: Secondary | ICD-10-CM | POA: Diagnosis not present

## 2023-04-12 DIAGNOSIS — R42 Dizziness and giddiness: Secondary | ICD-10-CM | POA: Diagnosis not present

## 2023-04-12 DIAGNOSIS — R531 Weakness: Secondary | ICD-10-CM | POA: Diagnosis not present

## 2023-04-12 LAB — RAD ONC ARIA SESSION SUMMARY
Course Elapsed Days: 6
Plan Fractions Treated to Date: 4
Plan Prescribed Dose Per Fraction: 2 Gy
Plan Total Fractions Prescribed: 35
Plan Total Prescribed Dose: 70 Gy
Reference Point Dosage Given to Date: 8 Gy
Reference Point Session Dosage Given: 2 Gy
Session Number: 4

## 2023-04-12 LAB — CBC WITH DIFFERENTIAL (CANCER CENTER ONLY)
Abs Immature Granulocytes: 0.14 10*3/uL — ABNORMAL HIGH (ref 0.00–0.07)
Basophils Absolute: 0 10*3/uL (ref 0.0–0.1)
Basophils Relative: 0 %
Eosinophils Absolute: 0 10*3/uL (ref 0.0–0.5)
Eosinophils Relative: 0 %
HCT: 30.1 % — ABNORMAL LOW (ref 39.0–52.0)
Hemoglobin: 9.3 g/dL — ABNORMAL LOW (ref 13.0–17.0)
Immature Granulocytes: 1 %
Lymphocytes Relative: 4 %
Lymphs Abs: 0.5 10*3/uL — ABNORMAL LOW (ref 0.7–4.0)
MCH: 26.2 pg (ref 26.0–34.0)
MCHC: 30.9 g/dL (ref 30.0–36.0)
MCV: 84.8 fL (ref 80.0–100.0)
Monocytes Absolute: 0.5 10*3/uL (ref 0.1–1.0)
Monocytes Relative: 4 %
Neutro Abs: 10.2 10*3/uL — ABNORMAL HIGH (ref 1.7–7.7)
Neutrophils Relative %: 91 %
Platelet Count: 420 10*3/uL — ABNORMAL HIGH (ref 150–400)
RBC: 3.55 MIL/uL — ABNORMAL LOW (ref 4.22–5.81)
RDW: 15.9 % — ABNORMAL HIGH (ref 11.5–15.5)
WBC Count: 11.3 10*3/uL — ABNORMAL HIGH (ref 4.0–10.5)
nRBC: 0 % (ref 0.0–0.2)

## 2023-04-12 LAB — CMP (CANCER CENTER ONLY)
ALT: 55 U/L — ABNORMAL HIGH (ref 0–44)
AST: 55 U/L — ABNORMAL HIGH (ref 15–41)
Albumin: 2.7 g/dL — ABNORMAL LOW (ref 3.5–5.0)
Alkaline Phosphatase: 120 U/L (ref 38–126)
Anion gap: 7 (ref 5–15)
BUN: 17 mg/dL (ref 8–23)
CO2: 21 mmol/L — ABNORMAL LOW (ref 22–32)
Calcium: 9.1 mg/dL (ref 8.9–10.3)
Chloride: 105 mmol/L (ref 98–111)
Creatinine: 0.61 mg/dL (ref 0.61–1.24)
GFR, Estimated: >60 mL/min (ref >60)
Glucose, Bld: 129 mg/dL — ABNORMAL HIGH (ref 70–99)
Potassium: 4.1 mmol/L (ref 3.5–5.1)
Sodium: 133 mmol/L — ABNORMAL LOW (ref 135–145)
Total Bilirubin: 0.8 mg/dL (ref 0.3–1.2)
Total Protein: 6.4 g/dL — ABNORMAL LOW (ref 6.5–8.1)

## 2023-04-12 LAB — IRON AND TIBC
Iron: 12 ug/dL — ABNORMAL LOW (ref 45–182)
Saturation Ratios: 6 % — ABNORMAL LOW (ref 17.9–39.5)
TIBC: 220 ug/dL — ABNORMAL LOW (ref 250–450)
UIBC: 208 ug/dL

## 2023-04-12 LAB — VITAMIN B12: Vitamin B-12: 376 pg/mL (ref 180–914)

## 2023-04-12 LAB — FERRITIN: Ferritin: 1282 ng/mL — ABNORMAL HIGH (ref 24–336)

## 2023-04-12 LAB — FOLATE: Folate: 20.3 ng/mL (ref >5.9)

## 2023-04-12 MED ORDER — POLYETHYLENE GLYCOL 3350 17 G PO PACK: 17.0000 g | PACK | Freq: Two times a day (BID) | ORAL | 2 refills | Status: DC | PRN

## 2023-04-12 MED ORDER — SENNA 8.6 MG PO TABS
1.0000 | ORAL_TABLET | Freq: Two times a day (BID) | ORAL | 0 refills | Status: AC | PRN
Start: 2023-04-12 — End: ?

## 2023-04-12 MED ORDER — MORPHINE SULFATE (PF) 2 MG/ML IV SOLN
4.0000 mg | Freq: Once | INTRAVENOUS | Status: AC | PRN
  Administered 2023-04-12: 4 mg via INTRAVENOUS
  Filled 2023-04-12: qty 2

## 2023-04-12 MED ORDER — PALONOSETRON HCL INJECTION 0.25 MG/5ML
0.2500 mg | Freq: Once | INTRAVENOUS | Status: AC
  Administered 2023-04-12: 0.25 mg via INTRAVENOUS
  Filled 2023-04-12: qty 5

## 2023-04-12 MED ORDER — FAMOTIDINE IN NACL 20-0.9 MG/50ML-% IV SOLN
20.0000 mg | Freq: Once | INTRAVENOUS | Status: AC
  Administered 2023-04-12: 20 mg via INTRAVENOUS
  Filled 2023-04-12: qty 50

## 2023-04-12 MED ORDER — DIPHENHYDRAMINE HCL 50 MG/ML IJ SOLN
25.0000 mg | Freq: Once | INTRAMUSCULAR | Status: AC
  Administered 2023-04-12: 25 mg via INTRAVENOUS
  Filled 2023-04-12: qty 1

## 2023-04-12 MED ORDER — HEPARIN SOD (PORK) LOCK FLUSH 100 UNIT/ML IV SOLN
500.0000 [IU] | Freq: Once | INTRAVENOUS | Status: AC | PRN
  Filled 2023-04-12: qty 5

## 2023-04-12 MED ORDER — SODIUM CHLORIDE 0.9 % IV SOLN
45.0000 mg/m2 | Freq: Once | INTRAVENOUS | Status: AC
  Administered 2023-04-12: 84 mg via INTRAVENOUS
  Filled 2023-04-12: qty 14

## 2023-04-12 MED ORDER — SODIUM CHLORIDE 0.9 % IV SOLN
Freq: Once | INTRAVENOUS | Status: AC
  Filled 2023-04-12: qty 250

## 2023-04-12 MED ORDER — OXYCODONE HCL 10 MG PO TABS: 10.0000 mg | ORAL_TABLET | Freq: Four times a day (QID) | ORAL | 0 refills | Status: DC | PRN

## 2023-04-12 MED ORDER — HEPARIN SOD (PORK) LOCK FLUSH 100 UNIT/ML IV SOLN
INTRAVENOUS | Status: AC
  Administered 2023-04-12: 500 [IU]
  Filled 2023-04-12: qty 5

## 2023-04-12 MED ORDER — SODIUM CHLORIDE 0.9 % IV SOLN
177.4000 mg | Freq: Once | INTRAVENOUS | Status: AC
  Administered 2023-04-12: 180 mg via INTRAVENOUS
  Filled 2023-04-12: qty 18

## 2023-04-12 MED ORDER — SODIUM CHLORIDE 0.9 % IV SOLN
10.0000 mg | Freq: Once | INTRAVENOUS | Status: AC
  Administered 2023-04-12: 10 mg via INTRAVENOUS
  Filled 2023-04-12: qty 10

## 2023-04-12 NOTE — Patient Instructions (Signed)
It is common for patients who are undergoing treatment and taking certain prescribed medications to experience side-effects with constipation.  If you experience constipation, please take stool softeners such as Senna and/or Miralax every day to avoid constipation.  These medications are available over the counter.  Of course, if you have diarrhea, stop taking stool softeners.  Drinking plenty of fluid, eating fruits and vegetable, and being active also reduces the risk of constipation.   If despite taking stool softeners, and you still have no bowel movement for 2 days or more than your normal bowel habit frequency, please take one of the following over the counter laxatives:  Milk of Magnesia or Mag Citrate everyday and contact me immediately for further instructions.  The goal is to have at least one bowel movement every day or every other day without pain or straining.   I've sent a refill of oxycodone to your pharmacy. Please wear compression stockings when out of bed to help with blood pressure and edema. Continue medications as prescribed. If you have questions or concerns, send a mychart message or call us, we're happy to help. [phone].  Leotis Shames, NP

## 2023-04-12 NOTE — Patient Instructions (Signed)
[place] [facility] AT [facility]  Discharge Instructions: Thank you for choosing [facility] to provide your oncology and hematology care.  If you have a lab appointment with the Cancer Center, please go directly to the Cancer Center and check in at the registration area.  Wear comfortable clothing and clothing appropriate for easy access to any Portacath or PICC line.   We strive to give you quality time with your provider. You may need to reschedule your appointment if you arrive late (15 or more minutes).  Arriving late affects you and other patients whose appointments are after yours.  Also, if you miss three or more appointments without notifying the office, you may be dismissed from the clinic at the provider's discretion.      For prescription refill requests, have your pharmacy contact our office and allow 72 hours for refills to be completed.    Today you received the following chemotherapy and/or immunotherapy agents Taxol and Carbo   To help prevent nausea and vomiting after your treatment, we encourage you to take your nausea medication as directed.  BELOW ARE SYMPTOMS THAT SHOULD BE REPORTED IMMEDIATELY: *FEVER GREATER THAN 100.4 F (38 C) OR HIGHER *CHILLS OR SWEATING *NAUSEA AND VOMITING THAT IS NOT CONTROLLED WITH YOUR NAUSEA MEDICATION *UNUSUAL SHORTNESS OF BREATH *UNUSUAL BRUISING OR BLEEDING *URINARY PROBLEMS (pain or burning when urinating, or frequent urination) *BOWEL PROBLEMS (unusual diarrhea, constipation, pain near the anus) TENDERNESS IN MOUTH AND THROAT WITH OR WITHOUT PRESENCE OF ULCERS (sore throat, sores in mouth, or a toothache) UNUSUAL RASH, SWELLING OR PAIN  UNUSUAL VAGINAL DISCHARGE OR ITCHING   Items with * indicate a potential emergency and should be followed up as soon as possible or go to the Emergency Department if any problems should occur.  Please show the CHEMOTHERAPY ALERT CARD or IMMUNOTHERAPY ALERT CARD at check-in  to the Emergency Department and triage nurse.  Should you have questions after your visit or need to cancel or reschedule your appointment, please contact [place] [facility] AT [facility]  [phone] and follow the prompts.  Office hours are 8:00 a.m. to 4:30 p.m. Monday - Friday. Please note that voicemails left after 4:00 p.m. may not be returned until the following business day.  We are closed weekends and major holidays. You have access to a nurse at all times for urgent questions. Please call the main number to the clinic [phone] and follow the prompts.  For any non-urgent questions, you may also contact your provider using MyChart. We now offer e-Visits for anyone 14 and older to request care online for non-urgent symptoms. For details visit [email or web address].   Also download the MyChart app! Go to the app store, search "MyChart", open the app, select [place], and log in with your MyChart username and password.

## 2023-04-12 NOTE — Progress Notes (Signed)
Nutrition Assessment   Reason for Assessment:   Poor appetite   ASSESSMENT:  67 year old male with lung cancer.  Past medical history of stroke, COPD.  Patient receiving concurrent chemo and radiation.   Met with patient during infusion.  Patient reports that his appetite "sucks".  Appetite has been down since before diagnosis (June 2024).  Started dexamethasone today.  Drinking ensure shakes.  Says that his wife prepares meals and does most of the cooking.    Medications: b complex, MVI, senna, miralax, compazine, vit D   Labs: reviewed   Anthropometrics:   Height: 72 inches Weight: 141 lb 152 lb on 6/1 BMI: 19  7% weight loss in the last 2 months   Estimated Energy Needs  Kcals: 1600-1920 Protein: 80-96 g Fluid: 1600-1920 ml   NUTRITION DIAGNOSIS: Inadequate oral intake related to cancer and related treatment side effects as evidenced by 7% weight loss and poor po intake   INTERVENTION:  Discussed ways to add calories and protein in diet. Handout provided Encouraged 350 calorie shake or higher 2-3 times per day. Coupons and examples provided Continue dexamethasone to help stimulate appetite Contact information provided   MONITORING, EVALUATION, GOAL: weight trends, intake   Next Visit: Tuesday, Aug 27 during infusion   B. Freida Busman, RD, LDN Registered Dietitian [phone]

## 2023-04-12 NOTE — Progress Notes (Signed)
[facility]  [phone] [phone]) [phone]  ID: Ethan Fitzgerald OB: Jan 15, 1956  MR#: [id number]  AOZ#:[id number]  Patient Care Team: Smitty Cords, DO as PCP - General (Family Medicine) Micki Riley, MD as Referring Physician (Neurology) Glory Buff, RN as Oncology Nurse Navigator Orlie Dakin, Tollie Pizza, MD as Consulting Physician (Oncology)  CHIEF COMPLAINT: Stage IIIa adenocarcinoma of the right upper lobe lung.  INTERVAL HISTORY: Patient returns to clinic today for further evaluation and consideration of cycle 2 of weekly carboplatin and paclitaxel chemotherapy with concurrent radiation. He continues to feel tired and has episodes of dizziness. Most pronounced when changing positions. Continues to have poor appetite. He has been fasting and held medications this morning due to planned chemotherapy. He has chronic pain but has had pain of the chest wall associated with his malignancy. Oxycodone has been controlling this normally. He complains of constipation. He denies shortness of breath, cough, or hemoptysis. He had diarrhea after his first treatment but it resolved. No nausea, vomiting. He denies urinary complaints. No further specific complaints today. He saw palliative care yesterday and started oral steroids. Also received IV fluids which improved his dizziness.   REVIEW OF SYSTEMS:   Review of Systems  Constitutional:  Positive for malaise/fatigue and weight loss. Negative for chills and fever.  HENT:  Negative for nosebleeds and sore throat.   Respiratory: Negative.  Negative for cough, hemoptysis and shortness of breath.   Cardiovascular:  Positive for chest pain and leg swelling. Negative for palpitations and claudication.  Gastrointestinal:  Positive for constipation and diarrhea. Negative for abdominal pain, blood in stool, melena, nausea and vomiting.  Genitourinary: Negative.  Negative for dysuria.  Musculoskeletal: Negative.   Negative for back pain and falls.  Skin: Negative.  Negative for itching and rash.  Neurological:  Positive for weakness. Negative for dizziness, focal weakness and headaches.  Psychiatric/Behavioral:  Negative for depression. The patient is nervous/anxious.   As per HPI. Otherwise, a complete review of systems is negative.  PAST MEDICAL HISTORY: Past Medical History:  Diagnosis Date   COPD (chronic obstructive pulmonary disease) (HCC)    mild   COVID-19 09/2019   Depression    Erectile dysfunction    Headache    daily, stress headaches   Knee pain, left    Seasonal allergies    Stroke (HCC)    Tobacco dependence     PAST SURGICAL HISTORY: Past Surgical History:  Procedure Laterality Date   BACK SURGERY     metal plate in back   COLONOSCOPY     COLONOSCOPY WITH PROPOFOL N/A 04/26/2019   Procedure: COLONOSCOPY WITH PROPOFOL;  Surgeon: Wyline Mood, MD;  Location: [facility] ENDOSCOPY;  Service: Gastroenterology;  Laterality: N/A;   FINE NEEDLE ASPIRATION  03/03/2023   Procedure: FINE NEEDLE ASPIRATION;  Surgeon: Raechel Chute, MD;  Location: [facility] ENDOSCOPY;  Service: Pulmonary;;   HERNIA REPAIR Right    IR ANGIO INTRA EXTRACRAN SEL COM CAROTID INNOMINATE UNI L MOD SED  03/27/2020   IR ANGIO VERTEBRAL SEL SUBCLAVIAN INNOMINATE UNI L MOD SED  03/27/2020   IR ANGIO VERTEBRAL SEL VERTEBRAL UNI R MOD SED  03/27/2020   IR CT HEAD LTD  03/27/2020   IR IMAGING GUIDED PORT INSERTION  03/29/2023   IR PERCUTANEOUS ART THROMBECTOMY/INFUSION INTRACRANIAL INC DIAG ANGIO  03/27/2020   KNEE ARTHROSCOPY Left 03/28/2015   Procedure: Arthroscopic partial medial meniscectomy plus chondral debridement;  Surgeon: Erin Sons, MD;  Location: [facility];  Service: Orthopedics;  Laterality: Left;   RADIOLOGY WITH ANESTHESIA N/A 03/27/2020   Procedure: IR WITH ANESTHESIA;  Surgeon: Julieanne Cotton, MD;  Location: [facility] OR;  Service: Radiology;  Laterality: N/A;   VIDEO BRONCHOSCOPY WITH ENDOBRONCHIAL  ULTRASOUND  03/03/2023   Procedure: VIDEO BRONCHOSCOPY WITH ENDOBRONCHIAL ULTRASOUND;  Surgeon: Raechel Chute, MD;  Location: [facility] ENDOSCOPY;  Service: Pulmonary;;    FAMILY HISTORY: Family History  Problem Relation Age of Onset   Diabetes Mother    Heart attack Mother 11   Cancer Father        liver    COPD Brother    HIV/AIDS Brother    Prostate cancer Neg Hx    Colon cancer Neg Hx     ADVANCED DIRECTIVES (Y/N):  N  HEALTH MAINTENANCE: Social History   Tobacco Use   Smoking status: Former    Current packs/day: 0.00    Average packs/day: 1.5 packs/day for 49.0 years (73.5 ttl pk-yrs)    Types: Cigarettes    Start date: 45    Quit date: 09/14/2017    Years since quitting: 5.5   Smokeless tobacco: Former  Building services engineer status: Never Used  Substance Use Topics   Alcohol use: Not Currently    Comment: 6 pack on weekend   Drug use: No    Colonoscopy:  PAP:  Bone density:  Lipid panel:  No Known Allergies  Current Outpatient Medications  Medication Sig Dispense Refill   albuterol (VENTOLIN HFA) 108 (90 Base) MCG/ACT inhaler INHALE 1 TO 2 PUFFS EVERY 4 HOURS AS NEEDED FOR WHEEZING OR SHORTNESS OF BREATH 1 each 2   b complex vitamins capsule Take 1 capsule by mouth daily.     Cholecalciferol (VITAMIN D) 50 MCG (2000 UT) tablet Take 2,000 Units by mouth daily.     dexamethasone (DECADRON) 4 MG tablet Take 1 tablet (4 mg total) by mouth daily. 30 tablet 2   diphenhydrAMINE (BENADRYL) 50 MG tablet Take 50-100 mg by mouth at bedtime as needed for sleep.     fludrocortisone (FLORINEF) 0.1 MG tablet Take 0.1 mg by mouth daily.     fluticasone (FLONASE) 50 MCG/ACT nasal spray Place 2 sprays into both nostrils daily. (Patient taking differently: Place 2 sprays into both nostrils daily as needed for allergies.) 48 g 3   lidocaine-prilocaine (EMLA) cream Apply to affected area once 30 g 3   loratadine (CLARITIN) 10 MG tablet Take 1 tablet (10 mg total) by mouth daily. Use  for 4-6 weeks then stop, and use as needed or seasonally (Patient taking differently: Take 10 mg by mouth daily as needed for allergies.) 30 tablet 11   melatonin 5 MG TABS Take 5 mg by mouth at bedtime as needed (sleep).     midodrine (PROAMATINE) 5 MG tablet Take 10 mg by mouth 3 (three) times daily with meals.     Multiple Vitamin (MULTIVITAMIN WITH MINERALS) TABS tablet Take 1 tablet by mouth daily.     Multiple Vitamins-Minerals (IMMUNE SUPPORT VITAMIN C PO) Take 1 tablet by mouth daily.     naproxen (NAPROSYN) 500 MG tablet Take 1 tablet (500 mg total) by mouth 2 (two) times daily with a meal. 10 tablet 0   OVER THE COUNTER MEDICATION Take 5 mLs by mouth every other day. Mary Ruth's vegan liquid iron     Oxycodone HCl 10 MG TABS Take 1 tablet (10 mg total) by mouth every 6 (six) hours as needed. 60 tablet 0   rosuvastatin (CRESTOR) 40  MG tablet Take 1 tablet (40 mg total) by mouth every other day. 45 tablet 3   sildenafil (REVATIO) 20 MG tablet TAKE 1 TO 5 TABLETS BY MOUTH ABOUT 30 MINUTES PRIOR TO SEX.START WITH 1 THEN INCREASE 30 tablet 5   STIOLTO RESPIMAT 2.5-2.5 MCG/ACT AERS INHALE 2 PUFFS INTO THE LUNGS DAILY. 12 g 3   aspirin EC 325 MG EC tablet Take 1 tablet (325 mg total) by mouth daily. (Patient not taking: Reported on 04/05/2023) 90 tablet 0   methocarbamol (ROBAXIN) 500 MG tablet Take 1 tablet (500 mg total) by mouth 4 (four) times daily. (Patient not taking: Reported on 04/05/2023) 20 tablet 0   ondansetron (ZOFRAN) 8 MG tablet Take 1 tablet (8 mg total) by mouth every 8 (eight) hours as needed for nausea or vomiting. Start on the third day after chemotherapy. (Patient not taking: Reported on 04/05/2023) 60 tablet 2   prochlorperazine (COMPAZINE) 10 MG tablet Take 1 tablet (10 mg total) by mouth every 6 (six) hours as needed for nausea or vomiting. (Patient not taking: Reported on 04/05/2023) 60 tablet 2   No current facility-administered medications for this visit.     OBJECTIVE: Vitals:   04/12/23 1103  BP: 90/64  Pulse: 95  Temp: 99.2 F (37.3 C)  SpO2: 100%      Body mass index is 19.12 kg/m.    ECOG FS:2 - Symptomatic, <50% confined to bed  General: Frail appearing. Thin build. No acute distress. Sitting in a wheelchair. Eyes: Pink conjunctiva, anicteric sclera. HEENT: Normocephalic, moist mucous membranes. Lungs: Decreased bilaterally. No audible coughing or wheezing.  Heart: Regular rate and rhythm. Abdomen: Soft, nontender, no obvious distention. Musculoskeletal: No edema, cyanosis, or clubbing. Neuro: Alert, answering all questions appropriately. Cranial nerves grossly intact. Skin: No rashes or petechiae noted. Pale.  Psych: Normal affect.  LAB RESULTS: Lab Results  Component Value Date   NA 133 (L) 04/12/2023   K 4.1 04/12/2023   CL 105 04/12/2023   CO2 21 (L) 04/12/2023   GLUCOSE 129 (H) 04/12/2023   BUN 17 04/12/2023   CREATININE 0.61 04/12/2023   CALCIUM 9.1 04/12/2023   PROT 6.4 (L) 04/12/2023   ALBUMIN 2.7 (L) 04/12/2023   AST 55 (H) 04/12/2023   ALT 55 (H) 04/12/2023   ALKPHOS 120 04/12/2023   BILITOT 0.8 04/12/2023   GFRNONAA >60 04/12/2023   GFRAA 85 12/22/2020   Lab Results  Component Value Date   WBC 11.3 (H) 04/12/2023   NEUTROABS 10.2 (H) 04/12/2023   HGB 9.3 (L) 04/12/2023   HCT 30.1 (L) 04/12/2023   MCV 84.8 04/12/2023   PLT 420 (H) 04/12/2023   Iron/TIBC/Ferritin/ %Sat    Component Value Date/Time   IRON 12 (L) 04/12/2023 1229   TIBC 220 (L) 04/12/2023 1229   FERRITIN 1,282 (H) 04/12/2023 1229   IRONPCTSAT 6 (L) 04/12/2023 1229     STUDIES: IR IMAGING GUIDED PORT INSERTION  Result Date: 03/29/2023 CLINICAL DATA:  Right upper lobe lung adenocarcinoma and need for porta cath to begin chemotherapy. EXAM: IMPLANTED PORT A CATH PLACEMENT WITH ULTRASOUND AND FLUOROSCOPIC GUIDANCE ANESTHESIA/SEDATION: Moderate (conscious) sedation was employed during this procedure. A total of Versed 2.0 mg  and Fentanyl 100 mcg was administered intravenously by radiology nursing. Moderate Sedation Time: 37 minutes. The patient's level of consciousness and vital signs were monitored continuously by radiology nursing throughout the procedure under my direct supervision. FLUOROSCOPY: 1.0 mGy PROCEDURE: The procedure, risks, benefits, and alternatives were explained to the patient.  Questions regarding the procedure were encouraged and answered. The patient understands and consents to the procedure. A time-out was performed prior to initiating the procedure. Ultrasound was utilized to confirm patency of the left internal jugular vein. A permanent ultrasound image was saved and recorded. The left neck and chest were prepped with chlorhexidine in a sterile fashion, and a sterile drape was applied covering the operative field. Maximum barrier sterile technique with sterile gowns and gloves were used for the procedure. Local anesthesia was provided with 1% lidocaine. After creating a small venotomy incision, a 21 gauge needle was advanced into the left internal jugular vein under direct, real-time ultrasound guidance. Ultrasound image documentation was performed. After securing guidewire access, an 8 Fr dilator was placed. A J-wire was kinked to measure appropriate catheter length. A subcutaneous port pocket was then created along the upper chest wall utilizing sharp and blunt dissection. Portable cautery was utilized. The pocket was irrigated with sterile saline. A single lumen power injectable port was chosen for placement. The 8 Fr catheter was tunneled from the port pocket site to the venotomy incision. The port was placed in the pocket. External catheter was trimmed to appropriate length based on guidewire measurement. At the venotomy, an 8 Fr peel-away sheath was placed over a guidewire. The catheter was then placed through the sheath and the sheath removed. Final catheter positioning was confirmed and documented with a  fluoroscopic spot image. The port was accessed with a needle and aspirated and flushed with heparinized saline. The access needle was removed. The venotomy and port pocket incisions were closed with subcutaneous 3-0 Monocryl and subcuticular 4-0 Vicryl. Dermabond was applied to both incisions. COMPLICATIONS: COMPLICATIONS None FINDINGS: After catheter placement, the tip lies at the cavo-atrial junction. The catheter aspirates normally and is ready for immediate use. IMPRESSION: Placement of single lumen port a cath via left internal jugular vein. The catheter tip lies at the cavo-atrial junction. A power injectable port a cath was placed and is ready for immediate use. Electronically Signed   By: Irish Lack M.D.   On: 03/29/2023 15:52    ASSESSMENT: Stage IIIa adenocarcinoma of the right upper lobe lung.  PLAN:    Stage IIIa adenocarcinoma of the right upper lobe lung: Pathology and imaging  confirmed diagnosis and stage of disease.  PET scan results from February 14, 2023 without obvious evidence of metastatic disease.  MRI of the brain from February 11, 2023 also negative for metastatic disease.  Surgical resection was not an option, therefore concurrent XRT with carboplatin-paclitaxel chemotherapy was recommended followed by maintenance durvalumab every 2 weeks x 1 year. He initiated chemotherapy on 04/05/23 and is s/p cycle 1. Labs today reviewed and acceptable for treatment. Proceed with cycle 2 today. He will return to clinic in 1 week for port/lab, further evaluation and consideration of cycle 3 with Dr. Orlie Dakin on 04/19/23.  Port a cath- placed for chemotherapy. Functioning appropriately.  Hypotension- Plan for IVF today. Encouraged intake. Monitor pressures at home as he may need interval fluids. He can try compression stockings which may help with dependent edema and help with blooo pressure. Prescription given to his wife to take to pharmacy.  Chest pain: Secondary to malignancy.  Continue oxycodone  as needed. Encouraged dexamethasone that he has not yet started. May improve appetite and pain. Refill of oxycodone provided today.  Constipation- likely secondary to treatment, antiemetics, limited mobility and intake, as well as narcotics. Start Senna 8.6mg , 2-12 per day, divided BID, Add miralax  every other day as needed on top of the senna. Up titrate to daily or BID if ineffective. Movement and fluids as able.  Iron deficiency anemia: Hemoglobin has decreased to 9.3. I repeated iron studies today and while ferritin is elevated, his iron saturation is decreased. Recommend adding IV iron to future treatments. Reviewed risks today including small risk of anaphylaxis. He agrees to proceed. Plan to add with next cycle.  Leukocytosis: Likely reactive. Stable. Monitor.  Thrombocytosis- likely reactive vs iron deficiency. Stable. Monitor.  Weight loss: Previously referred to dietary. Encouraged use of dexamethasone. Reviewed that he did not need ot fast for treatments or labs in the future. Strongly encourage intake.   Disposition:  1 week- port/lab (cbc, cmp), Dr Orlie Dakin, +/- carbo-taxol-IVF-venofer- la  Patient expressed understanding and was in agreement with this plan. He also understands that He can call clinic at any time with any questions, concerns, or complaints.    Cancer Staging  Adenocarcinoma of upper lobe of right lung [facility]) Staging form: Lung, AJCC 8th Edition - Clinical stage from 03/15/2023: Stage IIIA (cT4, cN0, cM0) - Signed by Jeralyn Ruths, MD on 03/15/2023 Stage prefix: Initial diagnosis  Alinda Dooms, NP   04/12/2023

## 2023-04-12 NOTE — Patient Instructions (Signed)

## 2023-04-13 ENCOUNTER — Telehealth: Payer: Self-pay | Admitting: *Deleted

## 2023-04-13 ENCOUNTER — Other Ambulatory Visit: Payer: Self-pay

## 2023-04-13 ENCOUNTER — Ambulatory Visit
Admission: RE | Admit: 2023-04-13 | Discharge: 2023-04-13 | Disposition: A | Discharge status: Home / Self Care | Payer: Medicare HMO | Source: Ambulatory Visit | Attending: Radiation Oncology | Admitting: Radiation Oncology

## 2023-04-13 ENCOUNTER — Inpatient Hospital Stay: Payer: Medicare HMO

## 2023-04-13 DIAGNOSIS — J449 Chronic obstructive pulmonary disease, unspecified: Secondary | ICD-10-CM | POA: Diagnosis not present

## 2023-04-13 DIAGNOSIS — C3411 Malignant neoplasm of upper lobe, right bronchus or lung: Secondary | ICD-10-CM | POA: Diagnosis not present

## 2023-04-13 DIAGNOSIS — Z5111 Encounter for antineoplastic chemotherapy: Secondary | ICD-10-CM | POA: Diagnosis not present

## 2023-04-13 DIAGNOSIS — R079 Chest pain, unspecified: Secondary | ICD-10-CM | POA: Diagnosis not present

## 2023-04-13 DIAGNOSIS — Z8673 Personal history of transient ischemic attack (TIA), and cerebral infarction without residual deficits: Secondary | ICD-10-CM | POA: Diagnosis not present

## 2023-04-13 DIAGNOSIS — R42 Dizziness and giddiness: Secondary | ICD-10-CM | POA: Diagnosis not present

## 2023-04-13 DIAGNOSIS — Z510 Encounter for antineoplastic radiation therapy: Secondary | ICD-10-CM | POA: Diagnosis not present

## 2023-04-13 DIAGNOSIS — R634 Abnormal weight loss: Secondary | ICD-10-CM | POA: Diagnosis not present

## 2023-04-13 DIAGNOSIS — R531 Weakness: Secondary | ICD-10-CM | POA: Diagnosis not present

## 2023-04-13 DIAGNOSIS — Z87891 Personal history of nicotine dependence: Secondary | ICD-10-CM | POA: Diagnosis not present

## 2023-04-13 LAB — RAD ONC ARIA SESSION SUMMARY
Course Elapsed Days: 7
Plan Fractions Treated to Date: 5
Plan Prescribed Dose Per Fraction: 2 Gy
Plan Total Fractions Prescribed: 35
Plan Total Prescribed Dose: 70 Gy
Reference Point Dosage Given to Date: 10 Gy
Reference Point Session Dosage Given: 2 Gy
Session Number: 5

## 2023-04-13 MED FILL — Iron Sucrose Inj 20 MG/ML (Fe Equiv): INTRAVENOUS | Qty: 10 | Status: AC

## 2023-04-13 NOTE — Telephone Encounter (Signed)
Patient's wife called to speak with Lauren about patient's lab results.

## 2023-04-14 ENCOUNTER — Ambulatory Visit: Payer: Medicare HMO

## 2023-04-14 ENCOUNTER — Other Ambulatory Visit: Payer: Self-pay | Admitting: Family Medicine

## 2023-04-14 ENCOUNTER — Inpatient Hospital Stay: Payer: Medicare HMO

## 2023-04-14 DIAGNOSIS — I6930 Unspecified sequelae of cerebral infarction: Secondary | ICD-10-CM

## 2023-04-14 DIAGNOSIS — E782 Mixed hyperlipidemia: Secondary | ICD-10-CM

## 2023-04-15 ENCOUNTER — Other Ambulatory Visit: Payer: Self-pay

## 2023-04-15 ENCOUNTER — Ambulatory Visit: Admission: RE | Admit: 2023-04-15 | Payer: Medicare HMO | Source: Ambulatory Visit

## 2023-04-15 DIAGNOSIS — R42 Dizziness and giddiness: Secondary | ICD-10-CM | POA: Diagnosis not present

## 2023-04-15 DIAGNOSIS — Z510 Encounter for antineoplastic radiation therapy: Secondary | ICD-10-CM | POA: Diagnosis not present

## 2023-04-15 DIAGNOSIS — J449 Chronic obstructive pulmonary disease, unspecified: Secondary | ICD-10-CM | POA: Diagnosis not present

## 2023-04-15 DIAGNOSIS — R079 Chest pain, unspecified: Secondary | ICD-10-CM | POA: Diagnosis not present

## 2023-04-15 DIAGNOSIS — J432 Centrilobular emphysema: Secondary | ICD-10-CM

## 2023-04-15 DIAGNOSIS — R531 Weakness: Secondary | ICD-10-CM | POA: Diagnosis not present

## 2023-04-15 DIAGNOSIS — R634 Abnormal weight loss: Secondary | ICD-10-CM | POA: Diagnosis not present

## 2023-04-15 DIAGNOSIS — Z8673 Personal history of transient ischemic attack (TIA), and cerebral infarction without residual deficits: Secondary | ICD-10-CM | POA: Diagnosis not present

## 2023-04-15 DIAGNOSIS — Z87891 Personal history of nicotine dependence: Secondary | ICD-10-CM | POA: Diagnosis not present

## 2023-04-15 DIAGNOSIS — Z5111 Encounter for antineoplastic chemotherapy: Secondary | ICD-10-CM | POA: Diagnosis not present

## 2023-04-15 DIAGNOSIS — C3411 Malignant neoplasm of upper lobe, right bronchus or lung: Secondary | ICD-10-CM | POA: Diagnosis not present

## 2023-04-15 LAB — RAD ONC ARIA SESSION SUMMARY
Course Elapsed Days: 9
Plan Fractions Treated to Date: 6
Plan Prescribed Dose Per Fraction: 2 Gy
Plan Total Fractions Prescribed: 35
Plan Total Prescribed Dose: 70 Gy
Reference Point Dosage Given to Date: 12 Gy
Reference Point Session Dosage Given: 2 Gy
Session Number: 6

## 2023-04-15 MED ORDER — ALBUTEROL SULFATE HFA 108 (90 BASE) MCG/ACT IN AERS: 2.0000 | INHALATION_SPRAY | RESPIRATORY_TRACT | 2 refills | Status: DC | PRN

## 2023-04-15 NOTE — Telephone Encounter (Signed)
Request is too soon for refill.  Requested Prescriptions  Pending Prescriptions Disp Refills   rosuvastatin (CRESTOR) 40 MG tablet [Pharmacy Med Name: ROSUVASTATIN CALCIUM 40 MG Tablet] 90 tablet 3    Sig: TAKE 1 TABLET AT BEDTIME . APPOINTMENT IS NEEDED     Cardiovascular:  Antilipid - Statins 2 Failed - 04/14/2023  2:31 AM      Failed - Lipid Panel in normal range within the last 12 months    Cholesterol  Date Value Ref Range Status  12/22/2022 110 <200 mg/dL Final   LDL Cholesterol (Calc)  Date Value Ref Range Status  12/22/2022 53 mg/dL (calc) Final    Comment:    Reference range: <100 . Desirable range <100 mg/dL for primary prevention;   <70 mg/dL for patients with CHD or diabetic patients  with > or = 2 CHD risk factors. Marland Kitchen LDL-C is now calculated using the Martin-Hopkins  calculation, which is a validated novel method providing  better accuracy than the Friedewald equation in the  estimation of LDL-C.  Horald Pollen et al. Lenox Ahr. 1191;478(29): 2061-2068  (http://education.[employer].com/faq/FAQ164)    HDL  Date Value Ref Range Status  12/22/2022 33 (L) > OR = 40 mg/dL Final   Triglycerides  Date Value Ref Range Status  12/22/2022 158 (H) <150 mg/dL Final         Passed - Cr in normal range and within 360 days    Creatinine  Date Value Ref Range Status  04/12/2023 0.61 0.61 - 1.24 mg/dL Final   Creat  Date Value Ref Range Status  12/22/2022 0.92 0.70 - 1.35 mg/dL Final         Passed - Patient is not pregnant      Passed - Valid encounter within last 12 months    Recent Outpatient Visits           2 months ago Right-sided chest wall pain   [facility] [place], Salvadore Oxford, NP   3 months ago Annual physical exam   [place] [facility] Smitty Cords, DO   5 months ago Orthostatic hypotension   [place] [facility] [place], Netta Neat, DO   7 months ago Acute right-sided low  back pain with right-sided sciatica   [place] [facility] [place], Salvadore Oxford, NP   9 months ago Centrilobular emphysema [facility])   [place] [facility] [place], Jackelyn Poling, RPH-CPP       Future Appointments             In 2 months Althea Charon, Netta Neat, DO [place] [facility], [facility]

## 2023-04-18 ENCOUNTER — Ambulatory Visit
Admission: RE | Admit: 2023-04-18 | Discharge: 2023-04-18 | Disposition: A | Discharge status: Home / Self Care | Payer: Medicare HMO | Source: Ambulatory Visit | Attending: Radiation Oncology | Admitting: Radiation Oncology

## 2023-04-18 ENCOUNTER — Other Ambulatory Visit: Payer: Self-pay

## 2023-04-18 DIAGNOSIS — C3411 Malignant neoplasm of upper lobe, right bronchus or lung: Secondary | ICD-10-CM | POA: Diagnosis not present

## 2023-04-18 DIAGNOSIS — R531 Weakness: Secondary | ICD-10-CM | POA: Diagnosis not present

## 2023-04-18 DIAGNOSIS — R42 Dizziness and giddiness: Secondary | ICD-10-CM | POA: Diagnosis not present

## 2023-04-18 DIAGNOSIS — R079 Chest pain, unspecified: Secondary | ICD-10-CM | POA: Diagnosis not present

## 2023-04-18 DIAGNOSIS — Z8673 Personal history of transient ischemic attack (TIA), and cerebral infarction without residual deficits: Secondary | ICD-10-CM | POA: Diagnosis not present

## 2023-04-18 DIAGNOSIS — Z510 Encounter for antineoplastic radiation therapy: Secondary | ICD-10-CM | POA: Diagnosis not present

## 2023-04-18 DIAGNOSIS — R634 Abnormal weight loss: Secondary | ICD-10-CM | POA: Diagnosis not present

## 2023-04-18 DIAGNOSIS — J449 Chronic obstructive pulmonary disease, unspecified: Secondary | ICD-10-CM | POA: Diagnosis not present

## 2023-04-18 DIAGNOSIS — Z87891 Personal history of nicotine dependence: Secondary | ICD-10-CM | POA: Diagnosis not present

## 2023-04-18 DIAGNOSIS — Z5111 Encounter for antineoplastic chemotherapy: Secondary | ICD-10-CM | POA: Diagnosis not present

## 2023-04-18 LAB — RAD ONC ARIA SESSION SUMMARY
Course Elapsed Days: 12
Plan Fractions Treated to Date: 7
Plan Prescribed Dose Per Fraction: 2 Gy
Plan Total Fractions Prescribed: 35
Plan Total Prescribed Dose: 70 Gy
Reference Point Dosage Given to Date: 14 Gy
Reference Point Session Dosage Given: 2 Gy
Session Number: 7

## 2023-04-18 MED FILL — Dexamethasone Sodium Phosphate Inj 100 MG/10ML: INTRAMUSCULAR | Qty: 1 | Status: AC

## 2023-04-19 ENCOUNTER — Encounter: Payer: Self-pay | Admitting: Oncology

## 2023-04-19 ENCOUNTER — Encounter: Payer: Self-pay | Admitting: *Deleted

## 2023-04-19 ENCOUNTER — Inpatient Hospital Stay: Payer: Medicare HMO

## 2023-04-19 ENCOUNTER — Ambulatory Visit
Admission: RE | Admit: 2023-04-19 | Discharge: 2023-04-19 | Disposition: A | Discharge status: Home / Self Care | Payer: Medicare HMO | Source: Ambulatory Visit | Attending: Radiation Oncology | Admitting: Radiation Oncology

## 2023-04-19 ENCOUNTER — Ambulatory Visit: Payer: Medicare HMO

## 2023-04-19 ENCOUNTER — Inpatient Hospital Stay (HOSPITAL_BASED_OUTPATIENT_CLINIC_OR_DEPARTMENT_OTHER): Payer: Medicare HMO | Admitting: Oncology

## 2023-04-19 ENCOUNTER — Other Ambulatory Visit: Payer: Self-pay

## 2023-04-19 VITALS — BP 109/75 | HR 76 | Temp 96.8°F | Resp 16 | Ht 72.0 in | Wt 138.0 lb

## 2023-04-19 DIAGNOSIS — Z5111 Encounter for antineoplastic chemotherapy: Secondary | ICD-10-CM | POA: Diagnosis not present

## 2023-04-19 DIAGNOSIS — C3411 Malignant neoplasm of upper lobe, right bronchus or lung: Secondary | ICD-10-CM

## 2023-04-19 DIAGNOSIS — J449 Chronic obstructive pulmonary disease, unspecified: Secondary | ICD-10-CM | POA: Diagnosis not present

## 2023-04-19 DIAGNOSIS — R531 Weakness: Secondary | ICD-10-CM | POA: Diagnosis not present

## 2023-04-19 DIAGNOSIS — R079 Chest pain, unspecified: Secondary | ICD-10-CM | POA: Diagnosis not present

## 2023-04-19 DIAGNOSIS — R42 Dizziness and giddiness: Secondary | ICD-10-CM | POA: Diagnosis not present

## 2023-04-19 DIAGNOSIS — Z8673 Personal history of transient ischemic attack (TIA), and cerebral infarction without residual deficits: Secondary | ICD-10-CM | POA: Diagnosis not present

## 2023-04-19 DIAGNOSIS — Z510 Encounter for antineoplastic radiation therapy: Secondary | ICD-10-CM | POA: Diagnosis not present

## 2023-04-19 DIAGNOSIS — R634 Abnormal weight loss: Secondary | ICD-10-CM | POA: Diagnosis not present

## 2023-04-19 DIAGNOSIS — Z87891 Personal history of nicotine dependence: Secondary | ICD-10-CM | POA: Diagnosis not present

## 2023-04-19 LAB — RAD ONC ARIA SESSION SUMMARY
Course Elapsed Days: 13
Plan Fractions Treated to Date: 8
Plan Prescribed Dose Per Fraction: 2 Gy
Plan Total Fractions Prescribed: 35
Plan Total Prescribed Dose: 70 Gy
Reference Point Dosage Given to Date: 16 Gy
Reference Point Session Dosage Given: 2 Gy
Session Number: 8

## 2023-04-19 LAB — CMP (CANCER CENTER ONLY)
ALT: 51 U/L — ABNORMAL HIGH (ref 0–44)
AST: 33 U/L (ref 15–41)
Albumin: 2.9 g/dL — ABNORMAL LOW (ref 3.5–5.0)
Alkaline Phosphatase: 104 U/L (ref 38–126)
Anion gap: 8 (ref 5–15)
BUN: 18 mg/dL (ref 8–23)
CO2: 22 mmol/L (ref 22–32)
Calcium: 9.5 mg/dL (ref 8.9–10.3)
Chloride: 105 mmol/L (ref 98–111)
Creatinine: 0.82 mg/dL (ref 0.61–1.24)
GFR, Estimated: >60 mL/min (ref >60)
Glucose, Bld: 127 mg/dL — ABNORMAL HIGH (ref 70–99)
Potassium: 3.7 mmol/L (ref 3.5–5.1)
Sodium: 135 mmol/L (ref 135–145)
Total Bilirubin: 0.4 mg/dL (ref 0.3–1.2)
Total Protein: 6.4 g/dL — ABNORMAL LOW (ref 6.5–8.1)

## 2023-04-19 LAB — CBC WITH DIFFERENTIAL (CANCER CENTER ONLY)
Abs Immature Granulocytes: 0.18 10*3/uL — ABNORMAL HIGH (ref 0.00–0.07)
Basophils Absolute: 0 10*3/uL (ref 0.0–0.1)
Basophils Relative: 0 %
Eosinophils Absolute: 0.1 10*3/uL (ref 0.0–0.5)
Eosinophils Relative: 1 %
HCT: 32.5 % — ABNORMAL LOW (ref 39.0–52.0)
Hemoglobin: 10 g/dL — ABNORMAL LOW (ref 13.0–17.0)
Immature Granulocytes: 2 %
Lymphocytes Relative: 15 %
Lymphs Abs: 1.4 10*3/uL (ref 0.7–4.0)
MCH: 26.5 pg (ref 26.0–34.0)
MCHC: 30.8 g/dL (ref 30.0–36.0)
MCV: 86.2 fL (ref 80.0–100.0)
Monocytes Absolute: 0.8 10*3/uL (ref 0.1–1.0)
Monocytes Relative: 8 %
Neutro Abs: 6.9 10*3/uL (ref 1.7–7.7)
Neutrophils Relative %: 74 %
Platelet Count: 342 10*3/uL (ref 150–400)
RBC: 3.77 MIL/uL — ABNORMAL LOW (ref 4.22–5.81)
RDW: 16.5 % — ABNORMAL HIGH (ref 11.5–15.5)
WBC Count: 9.3 10*3/uL (ref 4.0–10.5)
nRBC: 0 % (ref 0.0–0.2)

## 2023-04-19 MED ORDER — HEPARIN SOD (PORK) LOCK FLUSH 100 UNIT/ML IV SOLN
500.0000 [IU] | Freq: Once | INTRAVENOUS | Status: AC | PRN
  Administered 2023-04-19: 500 [IU]
  Filled 2023-04-19: qty 5

## 2023-04-19 MED ORDER — SODIUM CHLORIDE 0.9 % IV SOLN
Freq: Once | INTRAVENOUS | Status: AC
  Filled 2023-04-19: qty 250

## 2023-04-19 MED ORDER — FAMOTIDINE IN NACL 20-0.9 MG/50ML-% IV SOLN
20.0000 mg | Freq: Once | INTRAVENOUS | Status: AC
  Administered 2023-04-19: 20 mg via INTRAVENOUS
  Filled 2023-04-19: qty 50

## 2023-04-19 MED ORDER — SODIUM CHLORIDE 0.9 % IV SOLN
177.4000 mg | Freq: Once | INTRAVENOUS | Status: AC
  Administered 2023-04-19: 180 mg via INTRAVENOUS
  Filled 2023-04-19: qty 18

## 2023-04-19 MED ORDER — SODIUM CHLORIDE 0.9 % IV SOLN
45.0000 mg/m2 | Freq: Once | INTRAVENOUS | Status: AC
  Administered 2023-04-19: 84 mg via INTRAVENOUS
  Filled 2023-04-19: qty 14

## 2023-04-19 MED ORDER — DIPHENHYDRAMINE HCL 50 MG/ML IJ SOLN
25.0000 mg | Freq: Once | INTRAMUSCULAR | Status: AC
  Administered 2023-04-19: 25 mg via INTRAVENOUS
  Filled 2023-04-19: qty 1

## 2023-04-19 MED ORDER — SODIUM CHLORIDE 0.9 % IV SOLN
10.0000 mg | Freq: Once | INTRAVENOUS | Status: AC
  Administered 2023-04-19: 10 mg via INTRAVENOUS
  Filled 2023-04-19: qty 10

## 2023-04-19 MED ORDER — PALONOSETRON HCL INJECTION 0.25 MG/5ML
0.2500 mg | Freq: Once | INTRAVENOUS | Status: AC
  Administered 2023-04-19: 0.25 mg via INTRAVENOUS
  Filled 2023-04-19: qty 5

## 2023-04-19 NOTE — Patient Instructions (Signed)

## 2023-04-19 NOTE — Progress Notes (Signed)
Met with patient during follow up visit with Dr. Orlie Dakin. No questions or needs at this time. Will follow up next week. Instructed pt to call with any questions or needs. Pt verbalized understanding.

## 2023-04-19 NOTE — Progress Notes (Signed)
[facility]  [phone] [phone]  ID: Ethan Fitzgerald OB: 26-Mar-1956  MR#: [id number]  XBM#:[id number]  Patient Care Team: Smitty Cords, DO as PCP - General (Family Medicine) Micki Riley, MD as Referring Physician (Neurology) Glory Buff, RN as Oncology Nurse Navigator Orlie Dakin, Tollie Pizza, MD as Consulting Physician (Oncology)  CHIEF COMPLAINT: Stage IIIa adenocarcinoma of the right upper lobe lung.  INTERVAL HISTORY: Patient returns to clinic today for further evaluation and consideration of cycle 3 of weekly carboplatin and Taxol.  He continues to have chronic weakness and fatigue, but otherwise is tolerating his treatments well without significant side effects. He continues to have a fair appetite.  He does not complain of pain today.  He has no neurologic complaints.  He denies any recent fevers or illnesses.   He denies any chest pain, shortness of breath, cough, or hemoptysis.  He has no nausea, vomiting, constipation, or diarrhea.  He has no urinary complaints.  Patient offers no further specific complaints today.  REVIEW OF SYSTEMS:   Review of Systems  Constitutional:  Positive for weight loss. Negative for fever and malaise/fatigue.  Respiratory: Negative.  Negative for cough, hemoptysis and shortness of breath.   Cardiovascular: Negative.  Negative for chest pain and leg swelling.  Gastrointestinal: Negative.  Negative for abdominal pain.  Genitourinary: Negative.  Negative for dysuria.  Musculoskeletal: Negative.  Negative for back pain.  Skin: Negative.  Negative for rash.  Neurological:  Positive for weakness. Negative for dizziness, focal weakness and headaches.  Psychiatric/Behavioral: Negative.  The patient is not nervous/anxious.     As per HPI. Otherwise, a complete review of systems is negative.  PAST MEDICAL HISTORY: Past Medical History:  Diagnosis Date   COPD (chronic obstructive pulmonary disease)  (HCC)    mild   COVID-19 09/2019   Depression    Erectile dysfunction    Headache    daily, stress headaches   Knee pain, left    Seasonal allergies    Stroke (HCC)    Tobacco dependence     PAST SURGICAL HISTORY: Past Surgical History:  Procedure Laterality Date   BACK SURGERY     metal plate in back   COLONOSCOPY     COLONOSCOPY WITH PROPOFOL N/A 04/26/2019   Procedure: COLONOSCOPY WITH PROPOFOL;  Surgeon: Wyline Mood, MD;  Location: [facility] ENDOSCOPY;  Service: Gastroenterology;  Laterality: N/A;   FINE NEEDLE ASPIRATION  03/03/2023   Procedure: FINE NEEDLE ASPIRATION;  Surgeon: Raechel Chute, MD;  Location: [facility] ENDOSCOPY;  Service: Pulmonary;;   HERNIA REPAIR Right    IR ANGIO INTRA EXTRACRAN SEL COM CAROTID INNOMINATE UNI L MOD SED  03/27/2020   IR ANGIO VERTEBRAL SEL SUBCLAVIAN INNOMINATE UNI L MOD SED  03/27/2020   IR ANGIO VERTEBRAL SEL VERTEBRAL UNI R MOD SED  03/27/2020   IR CT HEAD LTD  03/27/2020   IR IMAGING GUIDED PORT INSERTION  03/29/2023   IR PERCUTANEOUS ART THROMBECTOMY/INFUSION INTRACRANIAL INC DIAG ANGIO  03/27/2020   KNEE ARTHROSCOPY Left 03/28/2015   Procedure: Arthroscopic partial medial meniscectomy plus chondral debridement;  Surgeon: Erin Sons, MD;  Location: [facility];  Service: Orthopedics;  Laterality: Left;   RADIOLOGY WITH ANESTHESIA N/A 03/27/2020   Procedure: IR WITH ANESTHESIA;  Surgeon: Julieanne Cotton, MD;  Location: [facility] OR;  Service: Radiology;  Laterality: N/A;   VIDEO BRONCHOSCOPY WITH ENDOBRONCHIAL ULTRASOUND  03/03/2023   Procedure: VIDEO BRONCHOSCOPY WITH ENDOBRONCHIAL ULTRASOUND;  Surgeon: Raechel Chute, MD;  Location[facility]  ENDOSCOPY;  Service: Pulmonary;;    FAMILY HISTORY: Family History  Problem Relation Age of Onset   Diabetes Mother    Heart attack Mother 52   Cancer Father        liver    COPD Brother    HIV/AIDS Brother    Prostate cancer Neg Hx    Colon cancer Neg Hx     ADVANCED DIRECTIVES (Y/N):  N  HEALTH  MAINTENANCE: Social History   Tobacco Use   Smoking status: Former    Current packs/day: 0.00    Average packs/day: 1.5 packs/day for 49.0 years (73.5 ttl pk-yrs)    Types: Cigarettes    Start date: 45    Quit date: 09/14/2017    Years since quitting: 5.5   Smokeless tobacco: Former  Building services engineer status: Never Used  Substance Use Topics   Alcohol use: Not Currently    Comment: 6 pack on weekend   Drug use: No     Colonoscopy:  PAP:  Bone density:  Lipid panel:  No Known Allergies  Current Outpatient Medications  Medication Sig Dispense Refill   albuterol (VENTOLIN HFA) 108 (90 Base) MCG/ACT inhaler Inhale 2 puffs into the lungs every 4 (four) hours as needed for wheezing or shortness of breath. 1 each 2   b complex vitamins capsule Take 1 capsule by mouth daily.     Cholecalciferol (VITAMIN D) 50 MCG (2000 UT) tablet Take 2,000 Units by mouth daily.     dexamethasone (DECADRON) 4 MG tablet Take 1 tablet (4 mg total) by mouth daily. 30 tablet 2   diphenhydrAMINE (BENADRYL) 50 MG tablet Take 50-100 mg by mouth at bedtime as needed for sleep.     fludrocortisone (FLORINEF) 0.1 MG tablet Take 0.1 mg by mouth daily.     fluticasone (FLONASE) 50 MCG/ACT nasal spray Place 2 sprays into both nostrils daily. (Patient taking differently: Place 2 sprays into both nostrils daily as needed for allergies.) 48 g 3   lidocaine-prilocaine (EMLA) cream Apply to affected area once 30 g 3   loratadine (CLARITIN) 10 MG tablet Take 1 tablet (10 mg total) by mouth daily. Use for 4-6 weeks then stop, and use as needed or seasonally (Patient taking differently: Take 10 mg by mouth daily as needed for allergies.) 30 tablet 11   melatonin 5 MG TABS Take 5 mg by mouth at bedtime as needed (sleep).     midodrine (PROAMATINE) 5 MG tablet Take 10 mg by mouth 3 (three) times daily with meals.     Multiple Vitamin (MULTIVITAMIN WITH MINERALS) TABS tablet Take 1 tablet by mouth daily.     Multiple  Vitamins-Minerals (IMMUNE SUPPORT VITAMIN C PO) Take 1 tablet by mouth daily.     naproxen (NAPROSYN) 500 MG tablet Take 1 tablet (500 mg total) by mouth 2 (two) times daily with a meal. 10 tablet 0   OVER THE COUNTER MEDICATION Take 5 mLs by mouth every other day. [employer] vegan liquid iron     Oxycodone HCl 10 MG TABS Take 1 tablet (10 mg total) by mouth every 6 (six) hours as needed. Take with stool softeners to avoid constipation 120 tablet 0   polyethylene glycol (MIRALAX) 17 g packet Take 17 g by mouth 2 (two) times daily as needed for mild constipation or moderate constipation (Hold if diarrhea.). 100 each 2   rosuvastatin (CRESTOR) 40 MG tablet Take 1 tablet (40 mg total) by mouth every other day. 45  tablet 3   senna (SENOKOT) 8.6 MG TABS tablet Take 1-2 tablets (8.6-17.2 mg total) by mouth 2 (two) times daily as needed for mild constipation or moderate constipation (Hold if diarrhea). 120 tablet 0   sildenafil (REVATIO) 20 MG tablet TAKE 1 TO 5 TABLETS BY MOUTH ABOUT 30 MINUTES PRIOR TO SEX.START WITH 1 THEN INCREASE 30 tablet 5   STIOLTO RESPIMAT 2.5-2.5 MCG/ACT AERS INHALE 2 PUFFS INTO THE LUNGS DAILY. 12 g 3   aspirin EC 325 MG EC tablet Take 1 tablet (325 mg total) by mouth daily. (Patient not taking: Reported on 04/05/2023) 90 tablet 0   methocarbamol (ROBAXIN) 500 MG tablet Take 1 tablet (500 mg total) by mouth 4 (four) times daily. (Patient not taking: Reported on 04/05/2023) 20 tablet 0   ondansetron (ZOFRAN) 8 MG tablet Take 1 tablet (8 mg total) by mouth every 8 (eight) hours as needed for nausea or vomiting. Start on the third day after chemotherapy. (Patient not taking: Reported on 04/05/2023) 60 tablet 2   prochlorperazine (COMPAZINE) 10 MG tablet Take 1 tablet (10 mg total) by mouth every 6 (six) hours as needed for nausea or vomiting. (Patient not taking: Reported on 04/05/2023) 60 tablet 2   No current facility-administered medications for this visit.   Facility-Administered  Medications Ordered in Other Visits  Medication Dose Route Frequency Provider Last Rate Last Admin   CARBOplatin (PARAPLATIN) 180 mg in sodium chloride 0.9 % 100 mL chemo infusion  180 mg Intravenous Once Jeralyn Ruths, MD       PACLitaxel (TAXOL) 84 mg in sodium chloride 0.9 % 250 mL chemo infusion (</= 80mg /m2)  45 mg/m2 (Treatment Plan Recorded) Intravenous Once Jeralyn Ruths, MD        OBJECTIVE: Vitals:   04/19/23 0844  BP: 109/75  Pulse: 76  Resp: 16  Temp: (!) 96.8 F (36 C)  SpO2: 100%       Body mass index is 18.72 kg/m.    ECOG FS:1 - Symptomatic but completely ambulatory  General: Well-developed, well-nourished, no acute distress. Eyes: Pink conjunctiva, anicteric sclera. HEENT: Normocephalic, moist mucous membranes. Lungs: No audible wheezing or coughing. Heart: Regular rate and rhythm. Abdomen: Soft, nontender, no obvious distention. Musculoskeletal: No edema, cyanosis, or clubbing. Neuro: Alert, answering all questions appropriately. Cranial nerves grossly intact. Skin: No rashes or petechiae noted. Psych: Normal affect.   LAB RESULTS:  Lab Results  Component Value Date   NA 135 04/19/2023   K 3.7 04/19/2023   CL 105 04/19/2023   CO2 22 04/19/2023   GLUCOSE 127 (H) 04/19/2023   BUN 18 04/19/2023   CREATININE 0.82 04/19/2023   CALCIUM 9.5 04/19/2023   PROT 6.4 (L) 04/19/2023   ALBUMIN 2.9 (L) 04/19/2023   AST 33 04/19/2023   ALT 51 (H) 04/19/2023   ALKPHOS 104 04/19/2023   BILITOT 0.4 04/19/2023   GFRNONAA >60 04/19/2023   GFRAA 85 12/22/2020    Lab Results  Component Value Date   WBC 9.3 04/19/2023   NEUTROABS 6.9 04/19/2023   HGB 10.0 (L) 04/19/2023   HCT 32.5 (L) 04/19/2023   MCV 86.2 04/19/2023   PLT 342 04/19/2023     STUDIES: IR IMAGING GUIDED PORT INSERTION  Result Date: 03/29/2023 CLINICAL DATA:  Right upper lobe lung adenocarcinoma and need for porta cath to begin chemotherapy. EXAM: IMPLANTED PORT A CATH PLACEMENT  WITH ULTRASOUND AND FLUOROSCOPIC GUIDANCE ANESTHESIA/SEDATION: Moderate (conscious) sedation was employed during this procedure. A total of Versed 2.0 mg  and Fentanyl 100 mcg was administered intravenously by radiology nursing. Moderate Sedation Time: 37 minutes. The patient's level of consciousness and vital signs were monitored continuously by radiology nursing throughout the procedure under my direct supervision. FLUOROSCOPY: 1.0 mGy PROCEDURE: The procedure, risks, benefits, and alternatives were explained to the patient. Questions regarding the procedure were encouraged and answered. The patient understands and consents to the procedure. A time-out was performed prior to initiating the procedure. Ultrasound was utilized to confirm patency of the left internal jugular vein. A permanent ultrasound image was saved and recorded. The left neck and chest were prepped with chlorhexidine in a sterile fashion, and a sterile drape was applied covering the operative field. Maximum barrier sterile technique with sterile gowns and gloves were used for the procedure. Local anesthesia was provided with 1% lidocaine. After creating a small venotomy incision, a 21 gauge needle was advanced into the left internal jugular vein under direct, real-time ultrasound guidance. Ultrasound image documentation was performed. After securing guidewire access, an 8 Fr dilator was placed. A J-wire was kinked to measure appropriate catheter length. A subcutaneous port pocket was then created along the upper chest wall utilizing sharp and blunt dissection. Portable cautery was utilized. The pocket was irrigated with sterile saline. A single lumen power injectable port was chosen for placement. The 8 Fr catheter was tunneled from the port pocket site to the venotomy incision. The port was placed in the pocket. External catheter was trimmed to appropriate length based on guidewire measurement. At the venotomy, an 8 Fr peel-away sheath was placed  over a guidewire. The catheter was then placed through the sheath and the sheath removed. Final catheter positioning was confirmed and documented with a fluoroscopic spot image. The port was accessed with a needle and aspirated and flushed with heparinized saline. The access needle was removed. The venotomy and port pocket incisions were closed with subcutaneous 3-0 Monocryl and subcuticular 4-0 Vicryl. Dermabond was applied to both incisions. COMPLICATIONS: COMPLICATIONS None FINDINGS: After catheter placement, the tip lies at the cavo-atrial junction. The catheter aspirates normally and is ready for immediate use. IMPRESSION: Placement of single lumen port a cath via left internal jugular vein. The catheter tip lies at the cavo-atrial junction. A power injectable port a cath was placed and is ready for immediate use. Electronically Signed   By: Irish Lack M.D.   On: 03/29/2023 15:52    ASSESSMENT: Stage IIIa adenocarcinoma of the right upper lobe lung.  PLAN:    Stage IIIa adenocarcinoma of the right upper lobe lung: Pathology and imaging reviewed independently confirming diagnosis and stage of disease.  PET scan results from February 14, 2023 reviewed independently with no obvious evidence of metastatic disease.  MRI of the brain from February 11, 2023 also negative for metastatic disease.  Surgical resection was not an option, therefore we will proceed with concurrent XRT along with weekly carboplatin and Taxol. This will be followed by maintenance durvalumab every 2 weeks for 1 year.  Patient has had port placement.  Continue daily XRT.  Proceed with cycle 3 of treatment today.  Continue IV fluids and IV Venofer with each weekly treatment.  Return to clinic in 1 week for further evaluation and consideration of cycle 4.    Chest pain: Patient does not complain of this today.  Continue oxycodone as needed. Iron deficiency anemia: Hemoglobin 10.0 and previously patient was noted to have reduced iron stores.   IV Venofer has been added to treatment plan.  Leukocytosis: Resolved.   Weight loss: Patient was previously given a referral to dietary.  Patient expressed understanding and was in agreement with this plan. He also understands that He can call clinic at any time with any questions, concerns, or complaints.    Cancer Staging  Adenocarcinoma of upper lobe of right lung [facility]) Staging form: Lung, AJCC 8th Edition - Clinical stage from 03/15/2023: Stage IIIA (cT4, cN0, cM0) - Signed by Jeralyn Ruths, MD on 03/15/2023 Stage prefix: Initial diagnosis   Jeralyn Ruths, MD   04/19/2023 10:00 AM

## 2023-04-19 NOTE — Progress Notes (Signed)
Coughing up phlegm and had some blood come up Sunday.

## 2023-04-20 ENCOUNTER — Other Ambulatory Visit: Payer: Self-pay

## 2023-04-20 ENCOUNTER — Ambulatory Visit
Admission: RE | Admit: 2023-04-20 | Discharge: 2023-04-20 | Disposition: A | Discharge status: Home / Self Care | Payer: Medicare HMO | Source: Ambulatory Visit | Attending: Radiation Oncology | Admitting: Radiation Oncology

## 2023-04-20 DIAGNOSIS — Z8673 Personal history of transient ischemic attack (TIA), and cerebral infarction without residual deficits: Secondary | ICD-10-CM | POA: Diagnosis not present

## 2023-04-20 DIAGNOSIS — R634 Abnormal weight loss: Secondary | ICD-10-CM | POA: Diagnosis not present

## 2023-04-20 DIAGNOSIS — C3411 Malignant neoplasm of upper lobe, right bronchus or lung: Secondary | ICD-10-CM | POA: Diagnosis not present

## 2023-04-20 DIAGNOSIS — R079 Chest pain, unspecified: Secondary | ICD-10-CM | POA: Diagnosis not present

## 2023-04-20 DIAGNOSIS — Z510 Encounter for antineoplastic radiation therapy: Secondary | ICD-10-CM | POA: Diagnosis not present

## 2023-04-20 DIAGNOSIS — Z87891 Personal history of nicotine dependence: Secondary | ICD-10-CM | POA: Diagnosis not present

## 2023-04-20 DIAGNOSIS — Z5111 Encounter for antineoplastic chemotherapy: Secondary | ICD-10-CM | POA: Diagnosis not present

## 2023-04-20 DIAGNOSIS — R42 Dizziness and giddiness: Secondary | ICD-10-CM | POA: Diagnosis not present

## 2023-04-20 DIAGNOSIS — R531 Weakness: Secondary | ICD-10-CM | POA: Diagnosis not present

## 2023-04-20 DIAGNOSIS — J449 Chronic obstructive pulmonary disease, unspecified: Secondary | ICD-10-CM | POA: Diagnosis not present

## 2023-04-20 LAB — RAD ONC ARIA SESSION SUMMARY
Course Elapsed Days: 14
Plan Fractions Treated to Date: 9
Plan Prescribed Dose Per Fraction: 2 Gy
Plan Total Fractions Prescribed: 35
Plan Total Prescribed Dose: 70 Gy
Reference Point Dosage Given to Date: 18 Gy
Reference Point Session Dosage Given: 2 Gy
Session Number: 9

## 2023-04-21 ENCOUNTER — Ambulatory Visit
Admission: RE | Admit: 2023-04-21 | Discharge: 2023-04-21 | Disposition: A | Discharge status: Home / Self Care | Payer: Medicare HMO | Source: Ambulatory Visit | Attending: Radiation Oncology | Admitting: Radiation Oncology

## 2023-04-21 ENCOUNTER — Other Ambulatory Visit: Payer: Self-pay

## 2023-04-21 DIAGNOSIS — Z87891 Personal history of nicotine dependence: Secondary | ICD-10-CM | POA: Diagnosis not present

## 2023-04-21 DIAGNOSIS — Z5111 Encounter for antineoplastic chemotherapy: Secondary | ICD-10-CM | POA: Diagnosis not present

## 2023-04-21 DIAGNOSIS — R634 Abnormal weight loss: Secondary | ICD-10-CM | POA: Diagnosis not present

## 2023-04-21 DIAGNOSIS — Z8673 Personal history of transient ischemic attack (TIA), and cerebral infarction without residual deficits: Secondary | ICD-10-CM | POA: Diagnosis not present

## 2023-04-21 DIAGNOSIS — R531 Weakness: Secondary | ICD-10-CM | POA: Diagnosis not present

## 2023-04-21 DIAGNOSIS — R42 Dizziness and giddiness: Secondary | ICD-10-CM | POA: Diagnosis not present

## 2023-04-21 DIAGNOSIS — J449 Chronic obstructive pulmonary disease, unspecified: Secondary | ICD-10-CM | POA: Diagnosis not present

## 2023-04-21 DIAGNOSIS — Z510 Encounter for antineoplastic radiation therapy: Secondary | ICD-10-CM | POA: Diagnosis not present

## 2023-04-21 DIAGNOSIS — R079 Chest pain, unspecified: Secondary | ICD-10-CM | POA: Diagnosis not present

## 2023-04-21 DIAGNOSIS — C3411 Malignant neoplasm of upper lobe, right bronchus or lung: Secondary | ICD-10-CM | POA: Diagnosis not present

## 2023-04-21 LAB — RAD ONC ARIA SESSION SUMMARY
Course Elapsed Days: 15
Plan Fractions Treated to Date: 10
Plan Prescribed Dose Per Fraction: 2 Gy
Plan Total Fractions Prescribed: 35
Plan Total Prescribed Dose: 70 Gy
Reference Point Dosage Given to Date: 20 Gy
Reference Point Session Dosage Given: 2 Gy
Session Number: 10

## 2023-04-22 ENCOUNTER — Other Ambulatory Visit: Payer: Self-pay | Admitting: Oncology

## 2023-04-22 ENCOUNTER — Encounter: Payer: Self-pay | Admitting: Oncology

## 2023-04-22 ENCOUNTER — Other Ambulatory Visit: Payer: Self-pay

## 2023-04-22 ENCOUNTER — Ambulatory Visit
Admission: RE | Admit: 2023-04-22 | Discharge: 2023-04-22 | Disposition: A | Discharge status: Home / Self Care | Payer: Medicare HMO | Source: Ambulatory Visit | Attending: Radiation Oncology | Admitting: Radiation Oncology

## 2023-04-22 DIAGNOSIS — Z8673 Personal history of transient ischemic attack (TIA), and cerebral infarction without residual deficits: Secondary | ICD-10-CM | POA: Diagnosis not present

## 2023-04-22 DIAGNOSIS — R634 Abnormal weight loss: Secondary | ICD-10-CM | POA: Diagnosis not present

## 2023-04-22 DIAGNOSIS — Z510 Encounter for antineoplastic radiation therapy: Secondary | ICD-10-CM | POA: Diagnosis not present

## 2023-04-22 DIAGNOSIS — R079 Chest pain, unspecified: Secondary | ICD-10-CM | POA: Diagnosis not present

## 2023-04-22 DIAGNOSIS — R42 Dizziness and giddiness: Secondary | ICD-10-CM | POA: Diagnosis not present

## 2023-04-22 DIAGNOSIS — R531 Weakness: Secondary | ICD-10-CM | POA: Diagnosis not present

## 2023-04-22 DIAGNOSIS — C3411 Malignant neoplasm of upper lobe, right bronchus or lung: Secondary | ICD-10-CM | POA: Diagnosis not present

## 2023-04-22 DIAGNOSIS — Z5111 Encounter for antineoplastic chemotherapy: Secondary | ICD-10-CM | POA: Diagnosis not present

## 2023-04-22 DIAGNOSIS — Z87891 Personal history of nicotine dependence: Secondary | ICD-10-CM | POA: Diagnosis not present

## 2023-04-22 DIAGNOSIS — J449 Chronic obstructive pulmonary disease, unspecified: Secondary | ICD-10-CM | POA: Diagnosis not present

## 2023-04-22 LAB — RAD ONC ARIA SESSION SUMMARY
Course Elapsed Days: 16
Plan Fractions Treated to Date: 11
Plan Prescribed Dose Per Fraction: 2 Gy
Plan Total Fractions Prescribed: 35
Plan Total Prescribed Dose: 70 Gy
Reference Point Dosage Given to Date: 22 Gy
Reference Point Session Dosage Given: 2 Gy
Session Number: 11

## 2023-04-25 ENCOUNTER — Ambulatory Visit
Admission: RE | Admit: 2023-04-25 | Discharge: 2023-04-25 | Disposition: A | Discharge status: Home / Self Care | Payer: Medicare HMO | Source: Ambulatory Visit | Attending: Radiation Oncology | Admitting: Radiation Oncology

## 2023-04-25 ENCOUNTER — Encounter: Payer: Self-pay | Admitting: Oncology

## 2023-04-25 ENCOUNTER — Other Ambulatory Visit: Payer: Self-pay

## 2023-04-25 DIAGNOSIS — R079 Chest pain, unspecified: Secondary | ICD-10-CM | POA: Diagnosis not present

## 2023-04-25 DIAGNOSIS — Z8673 Personal history of transient ischemic attack (TIA), and cerebral infarction without residual deficits: Secondary | ICD-10-CM | POA: Diagnosis not present

## 2023-04-25 DIAGNOSIS — R531 Weakness: Secondary | ICD-10-CM | POA: Diagnosis not present

## 2023-04-25 DIAGNOSIS — Z5111 Encounter for antineoplastic chemotherapy: Secondary | ICD-10-CM | POA: Diagnosis not present

## 2023-04-25 DIAGNOSIS — J449 Chronic obstructive pulmonary disease, unspecified: Secondary | ICD-10-CM | POA: Diagnosis not present

## 2023-04-25 DIAGNOSIS — C3411 Malignant neoplasm of upper lobe, right bronchus or lung: Secondary | ICD-10-CM | POA: Diagnosis not present

## 2023-04-25 DIAGNOSIS — R634 Abnormal weight loss: Secondary | ICD-10-CM | POA: Diagnosis not present

## 2023-04-25 DIAGNOSIS — Z510 Encounter for antineoplastic radiation therapy: Secondary | ICD-10-CM | POA: Diagnosis not present

## 2023-04-25 DIAGNOSIS — R42 Dizziness and giddiness: Secondary | ICD-10-CM | POA: Diagnosis not present

## 2023-04-25 DIAGNOSIS — Z87891 Personal history of nicotine dependence: Secondary | ICD-10-CM | POA: Diagnosis not present

## 2023-04-25 LAB — RAD ONC ARIA SESSION SUMMARY
Course Elapsed Days: 19
Plan Fractions Treated to Date: 12
Plan Prescribed Dose Per Fraction: 2 Gy
Plan Total Fractions Prescribed: 35
Plan Total Prescribed Dose: 70 Gy
Reference Point Dosage Given to Date: 24 Gy
Reference Point Session Dosage Given: 2 Gy
Session Number: 12

## 2023-04-25 MED FILL — Dexamethasone Sodium Phosphate Inj 100 MG/10ML: INTRAMUSCULAR | Qty: 1 | Status: AC

## 2023-04-26 ENCOUNTER — Inpatient Hospital Stay: Payer: Medicare HMO | Admitting: Oncology

## 2023-04-26 ENCOUNTER — Other Ambulatory Visit: Payer: Self-pay

## 2023-04-26 ENCOUNTER — Encounter: Payer: Self-pay | Admitting: Oncology

## 2023-04-26 ENCOUNTER — Ambulatory Visit
Admission: RE | Admit: 2023-04-26 | Discharge: 2023-04-26 | Disposition: A | Discharge status: Home / Self Care | Payer: Medicare HMO | Source: Ambulatory Visit | Attending: Radiation Oncology | Admitting: Radiation Oncology

## 2023-04-26 ENCOUNTER — Inpatient Hospital Stay: Payer: Medicare HMO

## 2023-04-26 ENCOUNTER — Ambulatory Visit: Payer: Medicare HMO

## 2023-04-26 ENCOUNTER — Encounter: Payer: Self-pay | Admitting: *Deleted

## 2023-04-26 VITALS — BP 120/71

## 2023-04-26 DIAGNOSIS — Z5111 Encounter for antineoplastic chemotherapy: Secondary | ICD-10-CM | POA: Diagnosis not present

## 2023-04-26 DIAGNOSIS — C3411 Malignant neoplasm of upper lobe, right bronchus or lung: Secondary | ICD-10-CM

## 2023-04-26 DIAGNOSIS — Z87891 Personal history of nicotine dependence: Secondary | ICD-10-CM | POA: Diagnosis not present

## 2023-04-26 DIAGNOSIS — R634 Abnormal weight loss: Secondary | ICD-10-CM | POA: Diagnosis not present

## 2023-04-26 DIAGNOSIS — Z510 Encounter for antineoplastic radiation therapy: Secondary | ICD-10-CM | POA: Diagnosis not present

## 2023-04-26 DIAGNOSIS — R079 Chest pain, unspecified: Secondary | ICD-10-CM | POA: Diagnosis not present

## 2023-04-26 DIAGNOSIS — R531 Weakness: Secondary | ICD-10-CM | POA: Diagnosis not present

## 2023-04-26 DIAGNOSIS — Z8673 Personal history of transient ischemic attack (TIA), and cerebral infarction without residual deficits: Secondary | ICD-10-CM | POA: Diagnosis not present

## 2023-04-26 DIAGNOSIS — J449 Chronic obstructive pulmonary disease, unspecified: Secondary | ICD-10-CM | POA: Diagnosis not present

## 2023-04-26 DIAGNOSIS — R42 Dizziness and giddiness: Secondary | ICD-10-CM | POA: Diagnosis not present

## 2023-04-26 LAB — CMP (CANCER CENTER ONLY)
ALT: 29 U/L (ref 0–44)
AST: 27 U/L (ref 15–41)
Albumin: 2.8 g/dL — ABNORMAL LOW (ref 3.5–5.0)
Alkaline Phosphatase: 90 U/L (ref 38–126)
Anion gap: 5 (ref 5–15)
BUN: 21 mg/dL (ref 8–23)
CO2: 21 mmol/L — ABNORMAL LOW (ref 22–32)
Calcium: 9.4 mg/dL (ref 8.9–10.3)
Chloride: 108 mmol/L (ref 98–111)
Creatinine: 0.73 mg/dL (ref 0.61–1.24)
GFR, Estimated: >60 mL/min (ref >60)
Glucose, Bld: 111 mg/dL — ABNORMAL HIGH (ref 70–99)
Potassium: 3.3 mmol/L — ABNORMAL LOW (ref 3.5–5.1)
Sodium: 134 mmol/L — ABNORMAL LOW (ref 135–145)
Total Bilirubin: 0.5 mg/dL (ref 0.3–1.2)
Total Protein: 6.2 g/dL — ABNORMAL LOW (ref 6.5–8.1)

## 2023-04-26 LAB — CBC WITH DIFFERENTIAL (CANCER CENTER ONLY)
Abs Immature Granulocytes: 0.15 10*3/uL — ABNORMAL HIGH (ref 0.00–0.07)
Basophils Absolute: 0 10*3/uL (ref 0.0–0.1)
Basophils Relative: 0 %
Eosinophils Absolute: 0.1 10*3/uL (ref 0.0–0.5)
Eosinophils Relative: 1 %
HCT: 33.7 % — ABNORMAL LOW (ref 39.0–52.0)
Hemoglobin: 10.5 g/dL — ABNORMAL LOW (ref 13.0–17.0)
Immature Granulocytes: 2 %
Lymphocytes Relative: 18 %
Lymphs Abs: 1.7 10*3/uL (ref 0.7–4.0)
MCH: 27.3 pg (ref 26.0–34.0)
MCHC: 31.2 g/dL (ref 30.0–36.0)
MCV: 87.5 fL (ref 80.0–100.0)
Monocytes Absolute: 0.8 10*3/uL (ref 0.1–1.0)
Monocytes Relative: 8 %
Neutro Abs: 7 10*3/uL (ref 1.7–7.7)
Neutrophils Relative %: 71 %
Platelet Count: 277 10*3/uL (ref 150–400)
RBC: 3.85 MIL/uL — ABNORMAL LOW (ref 4.22–5.81)
RDW: 19.1 % — ABNORMAL HIGH (ref 11.5–15.5)
WBC Count: 9.8 10*3/uL (ref 4.0–10.5)
nRBC: 0 % (ref 0.0–0.2)

## 2023-04-26 LAB — IRON AND TIBC
Iron: 53 ug/dL (ref 45–182)
Saturation Ratios: 17 % — ABNORMAL LOW (ref 17.9–39.5)
TIBC: 307 ug/dL (ref 250–450)
UIBC: 254 ug/dL

## 2023-04-26 LAB — RAD ONC ARIA SESSION SUMMARY
Course Elapsed Days: 20
Plan Fractions Treated to Date: 13
Plan Prescribed Dose Per Fraction: 2 Gy
Plan Total Fractions Prescribed: 35
Plan Total Prescribed Dose: 70 Gy
Reference Point Dosage Given to Date: 26 Gy
Reference Point Session Dosage Given: 2 Gy
Session Number: 13

## 2023-04-26 LAB — FOLATE: Folate: 15 ng/mL (ref >5.9)

## 2023-04-26 LAB — FERRITIN: Ferritin: 662 ng/mL — ABNORMAL HIGH (ref 24–336)

## 2023-04-26 LAB — VITAMIN B12: Vitamin B-12: 393 pg/mL (ref 180–914)

## 2023-04-26 MED ORDER — SODIUM CHLORIDE 0.9 % IV SOLN
10.0000 mg | Freq: Once | INTRAVENOUS | Status: AC
  Administered 2023-04-26: 10 mg via INTRAVENOUS
  Filled 2023-04-26: qty 10

## 2023-04-26 MED ORDER — SODIUM CHLORIDE 0.9 % IV SOLN
45.0000 mg/m2 | Freq: Once | INTRAVENOUS | Status: AC
  Administered 2023-04-26: 84 mg via INTRAVENOUS
  Filled 2023-04-26: qty 14

## 2023-04-26 MED ORDER — SODIUM CHLORIDE 0.9 % IV SOLN
200.0000 mg | Freq: Once | INTRAVENOUS | Status: AC
  Administered 2023-04-26: 200 mg via INTRAVENOUS
  Filled 2023-04-26: qty 200

## 2023-04-26 MED ORDER — SODIUM CHLORIDE 0.9 % IV SOLN
Freq: Once | INTRAVENOUS | Status: AC
  Filled 2023-04-26: qty 250

## 2023-04-26 MED ORDER — SODIUM CHLORIDE 0.9% FLUSH
10.0000 mL | INTRAVENOUS | Status: DC | PRN
  Administered 2023-04-26: 10 mL
  Filled 2023-04-26: qty 10

## 2023-04-26 MED ORDER — FAMOTIDINE IN NACL 20-0.9 MG/50ML-% IV SOLN
20.0000 mg | Freq: Once | INTRAVENOUS | Status: AC
  Administered 2023-04-26: 20 mg via INTRAVENOUS
  Filled 2023-04-26: qty 50

## 2023-04-26 MED ORDER — HEPARIN SOD (PORK) LOCK FLUSH 100 UNIT/ML IV SOLN
500.0000 [IU] | Freq: Once | INTRAVENOUS | Status: AC | PRN
  Administered 2023-04-26: 500 [IU]
  Filled 2023-04-26: qty 5

## 2023-04-26 MED ORDER — PALONOSETRON HCL INJECTION 0.25 MG/5ML
0.2500 mg | Freq: Once | INTRAVENOUS | Status: AC
  Administered 2023-04-26: 0.25 mg via INTRAVENOUS
  Filled 2023-04-26: qty 5

## 2023-04-26 MED ORDER — SODIUM CHLORIDE 0.9 % IV SOLN
177.4000 mg | Freq: Once | INTRAVENOUS | Status: AC
  Administered 2023-04-26: 180 mg via INTRAVENOUS
  Filled 2023-04-26: qty 18

## 2023-04-26 MED ORDER — DIPHENHYDRAMINE HCL 50 MG/ML IJ SOLN
25.0000 mg | Freq: Once | INTRAMUSCULAR | Status: AC
  Administered 2023-04-26: 25 mg via INTRAVENOUS
  Filled 2023-04-26: qty 1

## 2023-04-26 NOTE — Progress Notes (Signed)
Questions about eye sensitivity. Wants to know if chemo has anything to do with it.

## 2023-04-26 NOTE — Progress Notes (Signed)
[facility]  Telephone:([phone]) [phone] Fax:(336) [phone]  ID: Ethan Fitzgerald OB: 1955/11/06  MR#: [id number]  AOZ#:[id number]  Patient Care Team: Smitty Cords, DO as PCP - General (Family Medicine) Micki Riley, MD as Referring Physician (Neurology) Glory Buff, RN as Oncology Nurse Navigator Orlie Dakin, Tollie Pizza, MD as Consulting Physician (Oncology)  CHIEF COMPLAINT: Stage IIIa adenocarcinoma of the right upper lobe lung.  INTERVAL HISTORY: Patient returns to clinic today for further evaluation and consideration of cycle 4 of weekly carboplatinum and Taxol.  He is complaining of photosensitivity, but otherwise feels well.  He continues to have chronic weakness and fatigue.  He has a fair appetite.  He does not complain of pain today.  He has no neurologic complaints.  He denies any recent fevers or illnesses.   He denies any chest pain, shortness of breath, cough, or hemoptysis.  He has no nausea, vomiting, constipation, or diarrhea.  He has no urinary complaints.  Patient offers no further specific complaints today.  REVIEW OF SYSTEMS:   Review of Systems  Constitutional:  Positive for malaise/fatigue. Negative for fever and weight loss.  Respiratory: Negative.  Negative for cough, hemoptysis and shortness of breath.   Cardiovascular: Negative.  Negative for chest pain and leg swelling.  Gastrointestinal: Negative.  Negative for abdominal pain.  Genitourinary: Negative.  Negative for dysuria.  Musculoskeletal: Negative.  Negative for back pain.  Skin: Negative.  Negative for rash.  Neurological:  Positive for weakness. Negative for dizziness, focal weakness and headaches.  Psychiatric/Behavioral: Negative.  The patient is not nervous/anxious.   All other systems reviewed and are negative.   As per HPI. Otherwise, a complete review of systems is negative.  PAST MEDICAL HISTORY: Past Medical History:  Diagnosis Date   COPD (chronic obstructive  pulmonary disease) (HCC)    mild   COVID-19 09/2019   Depression    Erectile dysfunction    Headache    daily, stress headaches   Knee pain, left    Seasonal allergies    Stroke (HCC)    Tobacco dependence     PAST SURGICAL HISTORY: Past Surgical History:  Procedure Laterality Date   BACK SURGERY     metal plate in back   COLONOSCOPY     COLONOSCOPY WITH PROPOFOL N/A 04/26/2019   Procedure: COLONOSCOPY WITH PROPOFOL;  Surgeon: Wyline Mood, MD;  Location: [facility] ENDOSCOPY;  Service: Gastroenterology;  Laterality: N/A;   FINE NEEDLE ASPIRATION  03/03/2023   Procedure: FINE NEEDLE ASPIRATION;  Surgeon: Raechel Chute, MD;  Location: [facility] ENDOSCOPY;  Service: Pulmonary;;   HERNIA REPAIR Right    IR ANGIO INTRA EXTRACRAN SEL COM CAROTID INNOMINATE UNI L MOD SED  03/27/2020   IR ANGIO VERTEBRAL SEL SUBCLAVIAN INNOMINATE UNI L MOD SED  03/27/2020   IR ANGIO VERTEBRAL SEL VERTEBRAL UNI R MOD SED  03/27/2020   IR CT HEAD LTD  03/27/2020   IR IMAGING GUIDED PORT INSERTION  03/29/2023   IR PERCUTANEOUS ART THROMBECTOMY/INFUSION INTRACRANIAL INC DIAG ANGIO  03/27/2020   KNEE ARTHROSCOPY Left 03/28/2015   Procedure: Arthroscopic partial medial meniscectomy plus chondral debridement;  Surgeon: Erin Sons, MD;  Location: [facility];  Service: Orthopedics;  Laterality: Left;   RADIOLOGY WITH ANESTHESIA N/A 03/27/2020   Procedure: IR WITH ANESTHESIA;  Surgeon: Julieanne Cotton, MD;  Location: [facility] OR;  Service: Radiology;  Laterality: N/A;   VIDEO BRONCHOSCOPY WITH ENDOBRONCHIAL ULTRASOUND  03/03/2023   Procedure: VIDEO BRONCHOSCOPY WITH ENDOBRONCHIAL ULTRASOUND;  Surgeon: Aundria Rud,  Lianne Bushy, MD;  Location: [facility] ENDOSCOPY;  Service: Pulmonary;;    FAMILY HISTORY: Family History  Problem Relation Age of Onset   Diabetes Mother    Heart attack Mother 96   Cancer Father        liver    COPD Brother    HIV/AIDS Brother    Prostate cancer Neg Hx    Colon cancer Neg Hx     ADVANCED DIRECTIVES  (Y/N):  N  HEALTH MAINTENANCE: Social History   Tobacco Use   Smoking status: Former    Current packs/day: 0.00    Average packs/day: 1.5 packs/day for 49.0 years (73.5 ttl pk-yrs)    Types: Cigarettes    Start date: 41    Quit date: 09/14/2017    Years since quitting: 5.6   Smokeless tobacco: Former  Building services engineer status: Never Used  Substance Use Topics   Alcohol use: Not Currently    Comment: 6 pack on weekend   Drug use: No     Colonoscopy:  PAP:  Bone density:  Lipid panel:  No Known Allergies  Current Outpatient Medications  Medication Sig Dispense Refill   albuterol (VENTOLIN HFA) 108 (90 Base) MCG/ACT inhaler Inhale 2 puffs into the lungs every 4 (four) hours as needed for wheezing or shortness of breath. 1 each 2   b complex vitamins capsule Take 1 capsule by mouth daily.     Cholecalciferol (VITAMIN D) 50 MCG (2000 UT) tablet Take 2,000 Units by mouth daily.     dexamethasone (DECADRON) 4 MG tablet Take 1 tablet (4 mg total) by mouth daily. 30 tablet 2   diphenhydrAMINE (BENADRYL) 50 MG tablet Take 50-100 mg by mouth at bedtime as needed for sleep.     fludrocortisone (FLORINEF) 0.1 MG tablet Take 0.1 mg by mouth daily.     fluticasone (FLONASE) 50 MCG/ACT nasal spray Place 2 sprays into both nostrils daily. (Patient taking differently: Place 2 sprays into both nostrils daily as needed for allergies.) 48 g 3   lidocaine-prilocaine (EMLA) cream Apply to affected area once 30 g 3   loratadine (CLARITIN) 10 MG tablet Take 1 tablet (10 mg total) by mouth daily. Use for 4-6 weeks then stop, and use as needed or seasonally (Patient taking differently: Take 10 mg by mouth daily as needed for allergies.) 30 tablet 11   melatonin 5 MG TABS Take 5 mg by mouth at bedtime as needed (sleep).     midodrine (PROAMATINE) 5 MG tablet Take 10 mg by mouth 3 (three) times daily with meals.     Multiple Vitamin (MULTIVITAMIN WITH MINERALS) TABS tablet Take 1 tablet by mouth  daily.     Multiple Vitamins-Minerals (IMMUNE SUPPORT VITAMIN C PO) Take 1 tablet by mouth daily.     naproxen (NAPROSYN) 500 MG tablet Take 1 tablet (500 mg total) by mouth 2 (two) times daily with a meal. 10 tablet 0   OVER THE COUNTER MEDICATION Take 5 mLs by mouth every other day. [employer] vegan liquid iron     Oxycodone HCl 10 MG TABS Take 1 tablet (10 mg total) by mouth every 6 (six) hours as needed. Take with stool softeners to avoid constipation 120 tablet 0   polyethylene glycol (MIRALAX) 17 g packet Take 17 g by mouth 2 (two) times daily as needed for mild constipation or moderate constipation (Hold if diarrhea.). 100 each 2   rosuvastatin (CRESTOR) 40 MG tablet Take 1 tablet (40 mg total) by  mouth every other day. 45 tablet 3   senna (SENOKOT) 8.6 MG TABS tablet Take 1-2 tablets (8.6-17.2 mg total) by mouth 2 (two) times daily as needed for mild constipation or moderate constipation (Hold if diarrhea). 120 tablet 0   sildenafil (REVATIO) 20 MG tablet TAKE 1 TO 5 TABLETS BY MOUTH ABOUT 30 MINUTES PRIOR TO SEX.START WITH 1 THEN INCREASE 30 tablet 5   STIOLTO RESPIMAT 2.5-2.5 MCG/ACT AERS INHALE 2 PUFFS INTO THE LUNGS DAILY. 12 g 3   aspirin EC 325 MG EC tablet Take 1 tablet (325 mg total) by mouth daily. (Patient not taking: Reported on 04/05/2023) 90 tablet 0   methocarbamol (ROBAXIN) 500 MG tablet Take 1 tablet (500 mg total) by mouth 4 (four) times daily. (Patient not taking: Reported on 04/05/2023) 20 tablet 0   ondansetron (ZOFRAN) 8 MG tablet Take 1 tablet (8 mg total) by mouth every 8 (eight) hours as needed for nausea or vomiting. Start on the third day after chemotherapy. (Patient not taking: Reported on 04/05/2023) 60 tablet 2   prochlorperazine (COMPAZINE) 10 MG tablet Take 1 tablet (10 mg total) by mouth every 6 (six) hours as needed for nausea or vomiting. (Patient not taking: Reported on 04/05/2023) 60 tablet 2   No current facility-administered medications for this visit.    Facility-Administered Medications Ordered in Other Visits  Medication Dose Route Frequency Provider Last Rate Last Admin   CARBOplatin (PARAPLATIN) 180 mg in sodium chloride 0.9 % 100 mL chemo infusion  180 mg Intravenous Once Jeralyn Ruths, MD       heparin lock flush 100 unit/mL  500 Units Intracatheter Once PRN Jeralyn Ruths, MD       PACLitaxel (TAXOL) 84 mg in sodium chloride 0.9 % 250 mL chemo infusion (</= 80mg /m2)  45 mg/m2 (Treatment Plan Recorded) Intravenous Once Jeralyn Ruths, MD 264 mL/hr at 04/26/23 1215 84 mg at 04/26/23 1215   sodium chloride flush (NS) 0.9 % injection 10 mL  10 mL Intracatheter PRN Jeralyn Ruths, MD        OBJECTIVE: Vitals:   04/26/23 0925  BP: (!) 89/69  Pulse: 61  Resp: 16  Temp: 97.7 F (36.5 C)  SpO2: 100%       Body mass index is 18.99 kg/m.    ECOG FS:1 - Symptomatic but completely ambulatory  General: Well-developed, well-nourished, no acute distress. Eyes: Pink conjunctiva, anicteric sclera. HEENT: Normocephalic, moist mucous membranes. Lungs: No audible wheezing or coughing. Heart: Regular rate and rhythm. Abdomen: Soft, nontender, no obvious distention. Musculoskeletal: No edema, cyanosis, or clubbing. Neuro: Alert, answering all questions appropriately. Cranial nerves grossly intact. Skin: No rashes or petechiae noted. Psych: Normal affect.  LAB RESULTS:  Lab Results  Component Value Date   NA 134 (L) 04/26/2023   K 3.3 (L) 04/26/2023   CL 108 04/26/2023   CO2 21 (L) 04/26/2023   GLUCOSE 111 (H) 04/26/2023   BUN 21 04/26/2023   CREATININE 0.73 04/26/2023   CALCIUM 9.4 04/26/2023   PROT 6.2 (L) 04/26/2023   ALBUMIN 2.8 (L) 04/26/2023   AST 27 04/26/2023   ALT 29 04/26/2023   ALKPHOS 90 04/26/2023   BILITOT 0.5 04/26/2023   GFRNONAA >60 04/26/2023   GFRAA 85 12/22/2020    Lab Results  Component Value Date   WBC 9.8 04/26/2023   NEUTROABS 7.0 04/26/2023   HGB 10.5 (L) 04/26/2023   HCT  33.7 (L) 04/26/2023   MCV 87.5 04/26/2023   PLT 277  04/26/2023     STUDIES: IR IMAGING GUIDED PORT INSERTION  Result Date: 03/29/2023 CLINICAL DATA:  Right upper lobe lung adenocarcinoma and need for porta cath to begin chemotherapy. EXAM: IMPLANTED PORT A CATH PLACEMENT WITH ULTRASOUND AND FLUOROSCOPIC GUIDANCE ANESTHESIA/SEDATION: Moderate (conscious) sedation was employed during this procedure. A total of Versed 2.0 mg and Fentanyl 100 mcg was administered intravenously by radiology nursing. Moderate Sedation Time: 37 minutes. The patient's level of consciousness and vital signs were monitored continuously by radiology nursing throughout the procedure under my direct supervision. FLUOROSCOPY: 1.0 mGy PROCEDURE: The procedure, risks, benefits, and alternatives were explained to the patient. Questions regarding the procedure were encouraged and answered. The patient understands and consents to the procedure. A time-out was performed prior to initiating the procedure. Ultrasound was utilized to confirm patency of the left internal jugular vein. A permanent ultrasound image was saved and recorded. The left neck and chest were prepped with chlorhexidine in a sterile fashion, and a sterile drape was applied covering the operative field. Maximum barrier sterile technique with sterile gowns and gloves were used for the procedure. Local anesthesia was provided with 1% lidocaine. After creating a small venotomy incision, a 21 gauge needle was advanced into the left internal jugular vein under direct, real-time ultrasound guidance. Ultrasound image documentation was performed. After securing guidewire access, an 8 Fr dilator was placed. A J-wire was kinked to measure appropriate catheter length. A subcutaneous port pocket was then created along the upper chest wall utilizing sharp and blunt dissection. Portable cautery was utilized. The pocket was irrigated with sterile saline. A single lumen power injectable port  was chosen for placement. The 8 Fr catheter was tunneled from the port pocket site to the venotomy incision. The port was placed in the pocket. External catheter was trimmed to appropriate length based on guidewire measurement. At the venotomy, an 8 Fr peel-away sheath was placed over a guidewire. The catheter was then placed through the sheath and the sheath removed. Final catheter positioning was confirmed and documented with a fluoroscopic spot image. The port was accessed with a needle and aspirated and flushed with heparinized saline. The access needle was removed. The venotomy and port pocket incisions were closed with subcutaneous 3-0 Monocryl and subcuticular 4-0 Vicryl. Dermabond was applied to both incisions. COMPLICATIONS: COMPLICATIONS None FINDINGS: After catheter placement, the tip lies at the cavo-atrial junction. The catheter aspirates normally and is ready for immediate use. IMPRESSION: Placement of single lumen port a cath via left internal jugular vein. The catheter tip lies at the cavo-atrial junction. A power injectable port a cath was placed and is ready for immediate use. Electronically Signed   By: Irish Lack M.D.   On: 03/29/2023 15:52    ASSESSMENT: Stage IIIa adenocarcinoma of the right upper lobe lung.  PLAN:    Stage IIIa adenocarcinoma of the right upper lobe lung: Pathology and imaging reviewed independently confirming diagnosis and stage of disease.  PET scan results from February 14, 2023 reviewed independently with no obvious evidence of metastatic disease.  MRI of the brain from February 11, 2023 also negative for metastatic disease.  Surgical resection was not an option, therefore we will proceed with concurrent XRT along with weekly carboplatin and Taxol. This will be followed by maintenance durvalumab every 2 weeks for 1 year.  Patient has had port placement.  Continue daily XRT.  Proceed with cycle 4 of treatment today. Continue IV fluids and IV Venofer with each weekly  treatment.  Return  to clinic in 1 week for further evaluation and consideration of cycle 5. Light sensitivity: Possibly related to Taxol.  Although atypical, a reported complaint is of photopsia or flashing lights.  Patient does not have any visual defects or headache.  Have recommended follow-up with his eye doctor. Chest pain: Patient does not complain of this today.  Continue oxycodone as needed. Iron deficiency anemia: Hemoglobin is 10.5 today.  Previously patient was noted to have reduced iron stores.  IV Venofer has been added to treatment plan.   Leukocytosis: Resolved.   Weight loss: Follow-up with dietary as indicated. Weakness and fatigue: IV fluids and Venofer as above.  Patient will also receive additional IV fluids on Thursday.   Patient expressed understanding and was in agreement with this plan. He also understands that He can call clinic at any time with any questions, concerns, or complaints.    Cancer Staging  Adenocarcinoma of upper lobe of right lung [facility]) Staging form: Lung, AJCC 8th Edition - Clinical stage from 03/15/2023: Stage IIIA (cT4, cN0, cM0) - Signed by Jeralyn Ruths, MD on 03/15/2023 Stage prefix: Initial diagnosis   Jeralyn Ruths, MD   04/26/2023 12:51 PM

## 2023-04-26 NOTE — Patient Instructions (Signed)
[facility] [facility]  Discharge Instructions: Thank you for choosing [facility] to provide your oncology and hematology care.  If you have a lab appointment with the Cancer Center, please go directly to the Cancer Center and check in at the registration area.  Wear comfortable clothing and clothing appropriate for easy access to any Portacath or PICC line.   We strive to give you quality time with your provider. You may need to reschedule your appointment if you arrive late (15 or more minutes).  Arriving late affects you and other patients whose appointments are after yours.  Also, if you miss three or more appointments without notifying the office, you may be dismissed from the clinic at the provider's discretion.      For prescription refill requests, have your pharmacy contact our office and allow 72 hours for refills to be completed.    Today you received the following chemotherapy and/or immunotherapy agents carboplatin, taxol, venofer      To help prevent nausea and vomiting after your treatment, we encourage you to take your nausea medication as directed.  BELOW ARE SYMPTOMS THAT SHOULD BE REPORTED IMMEDIATELY: *FEVER GREATER THAN 100.4 F (38 C) OR HIGHER *CHILLS OR SWEATING *NAUSEA AND VOMITING THAT IS NOT CONTROLLED WITH YOUR NAUSEA MEDICATION *UNUSUAL SHORTNESS OF BREATH *UNUSUAL BRUISING OR BLEEDING *URINARY PROBLEMS (pain or burning when urinating, or frequent urination) *BOWEL PROBLEMS (unusual diarrhea, constipation, pain near the anus) TENDERNESS IN MOUTH AND THROAT WITH OR WITHOUT PRESENCE OF ULCERS (sore throat, sores in mouth, or a toothache) UNUSUAL RASH, SWELLING OR PAIN  UNUSUAL VAGINAL DISCHARGE OR ITCHING   Items with * indicate a potential emergency and should be followed up as soon as possible or go to the Emergency Department if any problems should occur.  Please show the CHEMOTHERAPY ALERT CARD or IMMUNOTHERAPY ALERT  CARD at check-in to the Emergency Department and triage nurse.  Should you have questions after your visit or need to cancel or reschedule your appointment, please contact [place] [facility]  [phone] and follow the prompts.  Office hours are 8:00 a.m. to 4:30 p.m. Monday - Friday. Please note that voicemails left after 4:00 p.m. may not be returned until the following business day.  We are closed weekends and major holidays. You have access to a nurse at all times for urgent questions. Please call the main number to the clinic [phone] and follow the prompts.  For any non-urgent questions, you may also contact your provider using MyChart. We now offer e-Visits for anyone 29 and older to request care online for non-urgent symptoms. For details visit [email or web address].   Also download the MyChart app! Go to the app store, search "MyChart", open the app, select , and log in with your MyChart username and password.

## 2023-04-27 ENCOUNTER — Ambulatory Visit
Admission: RE | Admit: 2023-04-27 | Discharge: 2023-04-27 | Disposition: A | Discharge status: Home / Self Care | Payer: Medicare HMO | Source: Ambulatory Visit | Attending: Radiation Oncology | Admitting: Radiation Oncology

## 2023-04-27 ENCOUNTER — Encounter: Payer: Self-pay | Admitting: Oncology

## 2023-04-27 ENCOUNTER — Other Ambulatory Visit: Payer: Self-pay

## 2023-04-27 DIAGNOSIS — C3411 Malignant neoplasm of upper lobe, right bronchus or lung: Secondary | ICD-10-CM | POA: Diagnosis not present

## 2023-04-27 DIAGNOSIS — Z5111 Encounter for antineoplastic chemotherapy: Secondary | ICD-10-CM | POA: Diagnosis not present

## 2023-04-27 DIAGNOSIS — Z510 Encounter for antineoplastic radiation therapy: Secondary | ICD-10-CM | POA: Diagnosis not present

## 2023-04-27 DIAGNOSIS — R531 Weakness: Secondary | ICD-10-CM | POA: Diagnosis not present

## 2023-04-27 DIAGNOSIS — Z87891 Personal history of nicotine dependence: Secondary | ICD-10-CM | POA: Diagnosis not present

## 2023-04-27 DIAGNOSIS — R634 Abnormal weight loss: Secondary | ICD-10-CM | POA: Diagnosis not present

## 2023-04-27 DIAGNOSIS — Z8673 Personal history of transient ischemic attack (TIA), and cerebral infarction without residual deficits: Secondary | ICD-10-CM | POA: Diagnosis not present

## 2023-04-27 DIAGNOSIS — R42 Dizziness and giddiness: Secondary | ICD-10-CM | POA: Diagnosis not present

## 2023-04-27 DIAGNOSIS — J449 Chronic obstructive pulmonary disease, unspecified: Secondary | ICD-10-CM | POA: Diagnosis not present

## 2023-04-27 DIAGNOSIS — R079 Chest pain, unspecified: Secondary | ICD-10-CM | POA: Diagnosis not present

## 2023-04-27 LAB — RAD ONC ARIA SESSION SUMMARY
Course Elapsed Days: 21
Plan Fractions Treated to Date: 14
Plan Prescribed Dose Per Fraction: 2 Gy
Plan Total Fractions Prescribed: 35
Plan Total Prescribed Dose: 70 Gy
Reference Point Dosage Given to Date: 28 Gy
Reference Point Session Dosage Given: 2 Gy
Session Number: 14

## 2023-04-28 ENCOUNTER — Other Ambulatory Visit: Payer: Self-pay

## 2023-04-28 ENCOUNTER — Inpatient Hospital Stay: Payer: Medicare HMO

## 2023-04-28 ENCOUNTER — Ambulatory Visit
Admission: RE | Admit: 2023-04-28 | Discharge: 2023-04-28 | Disposition: A | Discharge status: Home / Self Care | Payer: Medicare HMO | Source: Ambulatory Visit | Attending: Radiation Oncology | Admitting: Radiation Oncology

## 2023-04-28 VITALS — BP 99/64 | HR 61 | Resp 20

## 2023-04-28 DIAGNOSIS — R079 Chest pain, unspecified: Secondary | ICD-10-CM | POA: Diagnosis not present

## 2023-04-28 DIAGNOSIS — Z5111 Encounter for antineoplastic chemotherapy: Secondary | ICD-10-CM | POA: Diagnosis not present

## 2023-04-28 DIAGNOSIS — R634 Abnormal weight loss: Secondary | ICD-10-CM | POA: Diagnosis not present

## 2023-04-28 DIAGNOSIS — C3411 Malignant neoplasm of upper lobe, right bronchus or lung: Secondary | ICD-10-CM | POA: Diagnosis not present

## 2023-04-28 DIAGNOSIS — D509 Iron deficiency anemia, unspecified: Secondary | ICD-10-CM

## 2023-04-28 DIAGNOSIS — Z510 Encounter for antineoplastic radiation therapy: Secondary | ICD-10-CM | POA: Diagnosis not present

## 2023-04-28 DIAGNOSIS — Z8673 Personal history of transient ischemic attack (TIA), and cerebral infarction without residual deficits: Secondary | ICD-10-CM | POA: Diagnosis not present

## 2023-04-28 DIAGNOSIS — R42 Dizziness and giddiness: Secondary | ICD-10-CM | POA: Diagnosis not present

## 2023-04-28 DIAGNOSIS — J449 Chronic obstructive pulmonary disease, unspecified: Secondary | ICD-10-CM | POA: Diagnosis not present

## 2023-04-28 DIAGNOSIS — R531 Weakness: Secondary | ICD-10-CM | POA: Diagnosis not present

## 2023-04-28 DIAGNOSIS — Z87891 Personal history of nicotine dependence: Secondary | ICD-10-CM | POA: Diagnosis not present

## 2023-04-28 LAB — RAD ONC ARIA SESSION SUMMARY
Course Elapsed Days: 22
Plan Fractions Treated to Date: 15
Plan Prescribed Dose Per Fraction: 2 Gy
Plan Total Fractions Prescribed: 35
Plan Total Prescribed Dose: 70 Gy
Reference Point Dosage Given to Date: 30 Gy
Reference Point Session Dosage Given: 2 Gy
Session Number: 15

## 2023-04-28 MED ORDER — SODIUM CHLORIDE 0.9 % IV SOLN
Freq: Once | INTRAVENOUS | Status: AC
  Filled 2023-04-28: qty 250

## 2023-04-28 MED ORDER — SODIUM CHLORIDE 0.9 % IV SOLN
Freq: Once | INTRAVENOUS | Status: DC
  Filled 2023-04-28: qty 250

## 2023-04-28 MED ORDER — SODIUM CHLORIDE 0.9% FLUSH
10.0000 mL | Freq: Once | INTRAVENOUS | Status: AC | PRN
  Administered 2023-04-28: 10 mL
  Filled 2023-04-28: qty 10

## 2023-04-28 MED ORDER — HEPARIN SOD (PORK) LOCK FLUSH 100 UNIT/ML IV SOLN
500.0000 [IU] | Freq: Once | INTRAVENOUS | Status: AC | PRN
  Administered 2023-04-28: 500 [IU]
  Filled 2023-04-28: qty 5

## 2023-04-29 ENCOUNTER — Ambulatory Visit
Admission: RE | Admit: 2023-04-29 | Discharge: 2023-04-29 | Disposition: A | Discharge status: Home / Self Care | Payer: Medicare HMO | Source: Ambulatory Visit | Attending: Radiation Oncology | Admitting: Radiation Oncology

## 2023-04-29 ENCOUNTER — Other Ambulatory Visit: Payer: Self-pay

## 2023-04-29 DIAGNOSIS — R531 Weakness: Secondary | ICD-10-CM | POA: Diagnosis not present

## 2023-04-29 DIAGNOSIS — R42 Dizziness and giddiness: Secondary | ICD-10-CM | POA: Diagnosis not present

## 2023-04-29 DIAGNOSIS — C3411 Malignant neoplasm of upper lobe, right bronchus or lung: Secondary | ICD-10-CM | POA: Diagnosis not present

## 2023-04-29 DIAGNOSIS — Z8673 Personal history of transient ischemic attack (TIA), and cerebral infarction without residual deficits: Secondary | ICD-10-CM | POA: Diagnosis not present

## 2023-04-29 DIAGNOSIS — R634 Abnormal weight loss: Secondary | ICD-10-CM | POA: Diagnosis not present

## 2023-04-29 DIAGNOSIS — R079 Chest pain, unspecified: Secondary | ICD-10-CM | POA: Diagnosis not present

## 2023-04-29 DIAGNOSIS — Z510 Encounter for antineoplastic radiation therapy: Secondary | ICD-10-CM | POA: Diagnosis not present

## 2023-04-29 DIAGNOSIS — J449 Chronic obstructive pulmonary disease, unspecified: Secondary | ICD-10-CM | POA: Diagnosis not present

## 2023-04-29 DIAGNOSIS — Z5111 Encounter for antineoplastic chemotherapy: Secondary | ICD-10-CM | POA: Diagnosis not present

## 2023-04-29 DIAGNOSIS — Z87891 Personal history of nicotine dependence: Secondary | ICD-10-CM | POA: Diagnosis not present

## 2023-04-29 LAB — RAD ONC ARIA SESSION SUMMARY
Course Elapsed Days: 23
Plan Fractions Treated to Date: 16
Plan Prescribed Dose Per Fraction: 2 Gy
Plan Total Fractions Prescribed: 35
Plan Total Prescribed Dose: 70 Gy
Reference Point Dosage Given to Date: 32 Gy
Reference Point Session Dosage Given: 2 Gy
Session Number: 16

## 2023-05-02 ENCOUNTER — Encounter: Payer: Self-pay | Admitting: Oncology

## 2023-05-02 ENCOUNTER — Other Ambulatory Visit: Payer: Self-pay

## 2023-05-02 ENCOUNTER — Ambulatory Visit
Admission: RE | Admit: 2023-05-02 | Discharge: 2023-05-02 | Disposition: A | Discharge status: Home / Self Care | Payer: Medicare HMO | Source: Ambulatory Visit | Attending: Radiation Oncology | Admitting: Radiation Oncology

## 2023-05-02 DIAGNOSIS — J449 Chronic obstructive pulmonary disease, unspecified: Secondary | ICD-10-CM | POA: Diagnosis not present

## 2023-05-02 DIAGNOSIS — Z510 Encounter for antineoplastic radiation therapy: Secondary | ICD-10-CM | POA: Diagnosis not present

## 2023-05-02 DIAGNOSIS — R42 Dizziness and giddiness: Secondary | ICD-10-CM | POA: Diagnosis not present

## 2023-05-02 DIAGNOSIS — Z8673 Personal history of transient ischemic attack (TIA), and cerebral infarction without residual deficits: Secondary | ICD-10-CM | POA: Diagnosis not present

## 2023-05-02 DIAGNOSIS — Z87891 Personal history of nicotine dependence: Secondary | ICD-10-CM | POA: Diagnosis not present

## 2023-05-02 DIAGNOSIS — R531 Weakness: Secondary | ICD-10-CM | POA: Diagnosis not present

## 2023-05-02 DIAGNOSIS — R634 Abnormal weight loss: Secondary | ICD-10-CM | POA: Diagnosis not present

## 2023-05-02 DIAGNOSIS — R079 Chest pain, unspecified: Secondary | ICD-10-CM | POA: Diagnosis not present

## 2023-05-02 DIAGNOSIS — Z5111 Encounter for antineoplastic chemotherapy: Secondary | ICD-10-CM | POA: Diagnosis not present

## 2023-05-02 DIAGNOSIS — C3411 Malignant neoplasm of upper lobe, right bronchus or lung: Secondary | ICD-10-CM | POA: Diagnosis not present

## 2023-05-02 LAB — RAD ONC ARIA SESSION SUMMARY
Course Elapsed Days: 26
Plan Fractions Treated to Date: 17
Plan Prescribed Dose Per Fraction: 2 Gy
Plan Total Fractions Prescribed: 35
Plan Total Prescribed Dose: 70 Gy
Reference Point Dosage Given to Date: 34 Gy
Reference Point Session Dosage Given: 2 Gy
Session Number: 17

## 2023-05-02 MED FILL — Dexamethasone Sodium Phosphate Inj 100 MG/10ML: INTRAMUSCULAR | Qty: 1 | Status: AC

## 2023-05-03 ENCOUNTER — Inpatient Hospital Stay: Payer: Medicare HMO | Admitting: Oncology

## 2023-05-03 ENCOUNTER — Ambulatory Visit: Admission: RE | Admit: 2023-05-03 | Payer: Medicare HMO | Source: Ambulatory Visit

## 2023-05-03 ENCOUNTER — Inpatient Hospital Stay: Payer: Medicare HMO

## 2023-05-03 ENCOUNTER — Ambulatory Visit: Payer: Medicare HMO

## 2023-05-03 ENCOUNTER — Other Ambulatory Visit: Payer: Self-pay

## 2023-05-03 ENCOUNTER — Encounter: Payer: Self-pay | Admitting: Oncology

## 2023-05-03 VITALS — BP 113/80 | HR 76 | Temp 97.2°F | Resp 18

## 2023-05-03 DIAGNOSIS — Z5111 Encounter for antineoplastic chemotherapy: Secondary | ICD-10-CM | POA: Diagnosis not present

## 2023-05-03 DIAGNOSIS — C3411 Malignant neoplasm of upper lobe, right bronchus or lung: Secondary | ICD-10-CM

## 2023-05-03 DIAGNOSIS — R634 Abnormal weight loss: Secondary | ICD-10-CM | POA: Diagnosis not present

## 2023-05-03 DIAGNOSIS — Z8673 Personal history of transient ischemic attack (TIA), and cerebral infarction without residual deficits: Secondary | ICD-10-CM | POA: Diagnosis not present

## 2023-05-03 DIAGNOSIS — Z510 Encounter for antineoplastic radiation therapy: Secondary | ICD-10-CM | POA: Diagnosis not present

## 2023-05-03 DIAGNOSIS — Z87891 Personal history of nicotine dependence: Secondary | ICD-10-CM | POA: Diagnosis not present

## 2023-05-03 DIAGNOSIS — R531 Weakness: Secondary | ICD-10-CM | POA: Diagnosis not present

## 2023-05-03 DIAGNOSIS — R42 Dizziness and giddiness: Secondary | ICD-10-CM | POA: Diagnosis not present

## 2023-05-03 DIAGNOSIS — R079 Chest pain, unspecified: Secondary | ICD-10-CM | POA: Diagnosis not present

## 2023-05-03 DIAGNOSIS — J449 Chronic obstructive pulmonary disease, unspecified: Secondary | ICD-10-CM | POA: Diagnosis not present

## 2023-05-03 LAB — CMP (CANCER CENTER ONLY)
ALT: 34 U/L (ref 0–44)
AST: 28 U/L (ref 15–41)
Albumin: 3.2 g/dL — ABNORMAL LOW (ref 3.5–5.0)
Alkaline Phosphatase: 79 U/L (ref 38–126)
Anion gap: 5 (ref 5–15)
BUN: 25 mg/dL — ABNORMAL HIGH (ref 8–23)
CO2: 22 mmol/L (ref 22–32)
Calcium: 9.8 mg/dL (ref 8.9–10.3)
Chloride: 108 mmol/L (ref 98–111)
Creatinine: 0.74 mg/dL (ref 0.61–1.24)
GFR, Estimated: >60 mL/min (ref >60)
Glucose, Bld: 109 mg/dL — ABNORMAL HIGH (ref 70–99)
Potassium: 4.1 mmol/L (ref 3.5–5.1)
Sodium: 135 mmol/L (ref 135–145)
Total Bilirubin: 0.3 mg/dL (ref 0.3–1.2)
Total Protein: 6.4 g/dL — ABNORMAL LOW (ref 6.5–8.1)

## 2023-05-03 LAB — RAD ONC ARIA SESSION SUMMARY
Course Elapsed Days: 27
Plan Fractions Treated to Date: 18
Plan Prescribed Dose Per Fraction: 2 Gy
Plan Total Fractions Prescribed: 35
Plan Total Prescribed Dose: 70 Gy
Reference Point Dosage Given to Date: 36 Gy
Reference Point Session Dosage Given: 2 Gy
Session Number: 18

## 2023-05-03 LAB — CBC WITH DIFFERENTIAL (CANCER CENTER ONLY)
Abs Immature Granulocytes: 0.21 10*3/uL — ABNORMAL HIGH (ref 0.00–0.07)
Basophils Absolute: 0.1 10*3/uL (ref 0.0–0.1)
Basophils Relative: 1 %
Eosinophils Absolute: 0.1 10*3/uL (ref 0.0–0.5)
Eosinophils Relative: 1 %
HCT: 33.9 % — ABNORMAL LOW (ref 39.0–52.0)
Hemoglobin: 10.7 g/dL — ABNORMAL LOW (ref 13.0–17.0)
Immature Granulocytes: 2 %
Lymphocytes Relative: 8 %
Lymphs Abs: 0.7 10*3/uL (ref 0.7–4.0)
MCH: 28 pg (ref 26.0–34.0)
MCHC: 31.6 g/dL (ref 30.0–36.0)
MCV: 88.7 fL (ref 80.0–100.0)
Monocytes Absolute: 0.5 10*3/uL (ref 0.1–1.0)
Monocytes Relative: 5 %
Neutro Abs: 7.3 10*3/uL (ref 1.7–7.7)
Neutrophils Relative %: 83 %
Platelet Count: 188 10*3/uL (ref 150–400)
RBC: 3.82 MIL/uL — ABNORMAL LOW (ref 4.22–5.81)
RDW: 20.8 % — ABNORMAL HIGH (ref 11.5–15.5)
WBC Count: 8.8 10*3/uL (ref 4.0–10.5)
nRBC: 0 % (ref 0.0–0.2)

## 2023-05-03 MED ORDER — HEPARIN SOD (PORK) LOCK FLUSH 100 UNIT/ML IV SOLN
500.0000 [IU] | Freq: Once | INTRAVENOUS | Status: AC | PRN
  Administered 2023-05-03: 500 [IU]
  Filled 2023-05-03: qty 5

## 2023-05-03 MED ORDER — SODIUM CHLORIDE 0.9 % IV SOLN
200.0000 mg | Freq: Once | INTRAVENOUS | Status: AC
  Administered 2023-05-03: 200 mg via INTRAVENOUS
  Filled 2023-05-03: qty 200

## 2023-05-03 MED ORDER — SODIUM CHLORIDE 0.9 % IV SOLN
Freq: Once | INTRAVENOUS | Status: AC
  Filled 2023-05-03: qty 250

## 2023-05-03 MED ORDER — PALONOSETRON HCL INJECTION 0.25 MG/5ML
0.2500 mg | Freq: Once | INTRAVENOUS | Status: AC
  Administered 2023-05-03: 0.25 mg via INTRAVENOUS
  Filled 2023-05-03: qty 5

## 2023-05-03 MED ORDER — DIPHENHYDRAMINE HCL 50 MG/ML IJ SOLN
25.0000 mg | Freq: Once | INTRAMUSCULAR | Status: AC
  Administered 2023-05-03: 25 mg via INTRAVENOUS
  Filled 2023-05-03: qty 1

## 2023-05-03 MED ORDER — FAMOTIDINE IN NACL 20-0.9 MG/50ML-% IV SOLN
20.0000 mg | Freq: Once | INTRAVENOUS | Status: AC
  Administered 2023-05-03: 20 mg via INTRAVENOUS
  Filled 2023-05-03: qty 50

## 2023-05-03 MED ORDER — SODIUM CHLORIDE 0.9 % IV SOLN
177.4000 mg | Freq: Once | INTRAVENOUS | Status: AC
  Administered 2023-05-03: 180 mg via INTRAVENOUS
  Filled 2023-05-03: qty 18

## 2023-05-03 MED ORDER — SODIUM CHLORIDE 0.9 % IV SOLN
45.0000 mg/m2 | Freq: Once | INTRAVENOUS | Status: AC
  Administered 2023-05-03: 84 mg via INTRAVENOUS
  Filled 2023-05-03: qty 14

## 2023-05-03 MED ORDER — SODIUM CHLORIDE 0.9 % IV SOLN
10.0000 mg | Freq: Once | INTRAVENOUS | Status: AC
  Administered 2023-05-03: 10 mg via INTRAVENOUS
  Filled 2023-05-03: qty 10

## 2023-05-03 NOTE — Progress Notes (Signed)
[facility]  [phone] [phone]  ID: Ethan Fitzgerald OB: 10/16/1955  MR#: [id number]  AOZ#:[id number]  Patient Care Team: Smitty Cords, DO as PCP - General (Family Medicine) Micki Riley, MD as Referring Physician (Neurology) Glory Buff, RN as Oncology Nurse Navigator Orlie Dakin, Tollie Pizza, MD as Consulting Physician (Oncology)  CHIEF COMPLAINT: Stage IIIa adenocarcinoma of the right upper lobe lung.  INTERVAL HISTORY: Patient returns to clinic today for further evaluation and consideration of cycle 5 of weekly carboplatin and Taxol.  He feels his weakness and fatigue has improved.  He does not complain of photosensitivity today.  He has a fair appetite.  He does not complain of pain today.  He has no neurologic complaints.  He denies any recent fevers or illnesses.   He denies any chest pain, shortness of breath, cough, or hemoptysis.  He has no nausea, vomiting, constipation, or diarrhea.  He has no urinary complaints.  Patient offers no further specific complaints today.  REVIEW OF SYSTEMS:   Review of Systems  Constitutional:  Positive for malaise/fatigue. Negative for fever and weight loss.  Respiratory: Negative.  Negative for cough, hemoptysis and shortness of breath.   Cardiovascular: Negative.  Negative for chest pain and leg swelling.  Gastrointestinal: Negative.  Negative for abdominal pain.  Genitourinary: Negative.  Negative for dysuria.  Musculoskeletal: Negative.  Negative for back pain.  Skin: Negative.  Negative for rash.  Neurological:  Positive for weakness. Negative for dizziness, focal weakness and headaches.  Psychiatric/Behavioral: Negative.  The patient is not nervous/anxious.   All other systems reviewed and are negative.   As per HPI. Otherwise, a complete review of systems is negative.  PAST MEDICAL HISTORY: Past Medical History:  Diagnosis Date   COPD (chronic obstructive pulmonary disease) (HCC)     mild   COVID-19 09/2019   Depression    Erectile dysfunction    Headache    daily, stress headaches   Knee pain, left    Seasonal allergies    Stroke (HCC)    Tobacco dependence     PAST SURGICAL HISTORY: Past Surgical History:  Procedure Laterality Date   BACK SURGERY     metal plate in back   COLONOSCOPY     COLONOSCOPY WITH PROPOFOL N/A 04/26/2019   Procedure: COLONOSCOPY WITH PROPOFOL;  Surgeon: Wyline Mood, MD;  Location: [facility] ENDOSCOPY;  Service: Gastroenterology;  Laterality: N/A;   FINE NEEDLE ASPIRATION  03/03/2023   Procedure: FINE NEEDLE ASPIRATION;  Surgeon: Raechel Chute, MD;  Location: [facility] ENDOSCOPY;  Service: Pulmonary;;   HERNIA REPAIR Right    IR ANGIO INTRA EXTRACRAN SEL COM CAROTID INNOMINATE UNI L MOD SED  03/27/2020   IR ANGIO VERTEBRAL SEL SUBCLAVIAN INNOMINATE UNI L MOD SED  03/27/2020   IR ANGIO VERTEBRAL SEL VERTEBRAL UNI R MOD SED  03/27/2020   IR CT HEAD LTD  03/27/2020   IR IMAGING GUIDED PORT INSERTION  03/29/2023   IR PERCUTANEOUS ART THROMBECTOMY/INFUSION INTRACRANIAL INC DIAG ANGIO  03/27/2020   KNEE ARTHROSCOPY Left 03/28/2015   Procedure: Arthroscopic partial medial meniscectomy plus chondral debridement;  Surgeon: Erin Sons, MD;  Location: [facility];  Service: Orthopedics;  Laterality: Left;   RADIOLOGY WITH ANESTHESIA N/A 03/27/2020   Procedure: IR WITH ANESTHESIA;  Surgeon: Julieanne Cotton, MD;  Location: [facility] OR;  Service: Radiology;  Laterality: N/A;   VIDEO BRONCHOSCOPY WITH ENDOBRONCHIAL ULTRASOUND  03/03/2023   Procedure: VIDEO BRONCHOSCOPY WITH ENDOBRONCHIAL ULTRASOUND;  Surgeon: Raechel Chute, MD;  Location: [facility] ENDOSCOPY;  Service: Pulmonary;;    FAMILY HISTORY: Family History  Problem Relation Age of Onset   Diabetes Mother    Heart attack Mother 87   Cancer Father        liver    COPD Brother    HIV/AIDS Brother    Prostate cancer Neg Hx    Colon cancer Neg Hx     ADVANCED DIRECTIVES (Y/N):  N  HEALTH  MAINTENANCE: Social History   Tobacco Use   Smoking status: Former    Current packs/day: 0.00    Average packs/day: 1.5 packs/day for 49.0 years (73.5 ttl pk-yrs)    Types: Cigarettes    Start date: 32    Quit date: 09/14/2017    Years since quitting: 5.6   Smokeless tobacco: Former  Building services engineer status: Never Used  Substance Use Topics   Alcohol use: Not Currently    Comment: 6 pack on weekend   Drug use: No     Colonoscopy:  PAP:  Bone density:  Lipid panel:  No Known Allergies  Current Outpatient Medications  Medication Sig Dispense Refill   albuterol (VENTOLIN HFA) 108 (90 Base) MCG/ACT inhaler Inhale 2 puffs into the lungs every 4 (four) hours as needed for wheezing or shortness of breath. 1 each 2   b complex vitamins capsule Take 1 capsule by mouth daily.     Cholecalciferol (VITAMIN D) 50 MCG (2000 UT) tablet Take 2,000 Units by mouth daily.     dexamethasone (DECADRON) 4 MG tablet Take 1 tablet (4 mg total) by mouth daily. 30 tablet 2   diphenhydrAMINE (BENADRYL) 50 MG tablet Take 50-100 mg by mouth at bedtime as needed for sleep.     fludrocortisone (FLORINEF) 0.1 MG tablet Take 0.1 mg by mouth daily.     fluticasone (FLONASE) 50 MCG/ACT nasal spray Place 2 sprays into both nostrils daily. (Patient taking differently: Place 2 sprays into both nostrils daily as needed for allergies.) 48 g 3   lidocaine-prilocaine (EMLA) cream Apply to affected area once 30 g 3   loratadine (CLARITIN) 10 MG tablet Take 1 tablet (10 mg total) by mouth daily. Use for 4-6 weeks then stop, and use as needed or seasonally (Patient taking differently: Take 10 mg by mouth daily as needed for allergies.) 30 tablet 11   melatonin 5 MG TABS Take 5 mg by mouth at bedtime as needed (sleep).     methocarbamol (ROBAXIN) 500 MG tablet Take 1 tablet (500 mg total) by mouth 4 (four) times daily. 20 tablet 0   midodrine (PROAMATINE) 5 MG tablet Take 10 mg by mouth 3 (three) times daily with  meals.     Multiple Vitamin (MULTIVITAMIN WITH MINERALS) TABS tablet Take 1 tablet by mouth daily.     Multiple Vitamins-Minerals (IMMUNE SUPPORT VITAMIN C PO) Take 1 tablet by mouth daily.     naproxen (NAPROSYN) 500 MG tablet Take 1 tablet (500 mg total) by mouth 2 (two) times daily with a meal. 10 tablet 0   ondansetron (ZOFRAN) 8 MG tablet Take 1 tablet (8 mg total) by mouth every 8 (eight) hours as needed for nausea or vomiting. Start on the third day after chemotherapy. 60 tablet 2   OVER THE COUNTER MEDICATION Take 5 mLs by mouth every other day. Mary [employer]'s vegan liquid iron     Oxycodone HCl 10 MG TABS Take 1 tablet (10 mg total) by mouth every 6 (six) hours as needed. Take  with stool softeners to avoid constipation 120 tablet 0   polyethylene glycol (MIRALAX) 17 g packet Take 17 g by mouth 2 (two) times daily as needed for mild constipation or moderate constipation (Hold if diarrhea.). 100 each 2   prochlorperazine (COMPAZINE) 10 MG tablet Take 1 tablet (10 mg total) by mouth every 6 (six) hours as needed for nausea or vomiting. 60 tablet 2   rosuvastatin (CRESTOR) 40 MG tablet Take 1 tablet (40 mg total) by mouth every other day. 45 tablet 3   senna (SENOKOT) 8.6 MG TABS tablet Take 1-2 tablets (8.6-17.2 mg total) by mouth 2 (two) times daily as needed for mild constipation or moderate constipation (Hold if diarrhea). 120 tablet 0   sildenafil (REVATIO) 20 MG tablet TAKE 1 TO 5 TABLETS BY MOUTH ABOUT 30 MINUTES PRIOR TO SEX.START WITH 1 THEN INCREASE 30 tablet 5   STIOLTO RESPIMAT 2.5-2.5 MCG/ACT AERS INHALE 2 PUFFS INTO THE LUNGS DAILY. 12 g 3   aspirin EC 325 MG EC tablet Take 1 tablet (325 mg total) by mouth daily. (Patient not taking: Reported on 04/05/2023) 90 tablet 0   No current facility-administered medications for this visit.   Facility-Administered Medications Ordered in Other Visits  Medication Dose Route Frequency Provider Last Rate Last Admin   CARBOplatin (PARAPLATIN) 180  mg in sodium chloride 0.9 % 100 mL chemo infusion  180 mg Intravenous Once Jeralyn Ruths, MD       PACLitaxel (TAXOL) 84 mg in sodium chloride 0.9 % 250 mL chemo infusion (</= 80mg /m2)  45 mg/m2 (Treatment Plan Recorded) Intravenous Once Jeralyn Ruths, MD 264 mL/hr at 05/03/23 1124 84 mg at 05/03/23 1124    OBJECTIVE: Vitals:   05/03/23 0902  BP: 92/68  Pulse: 61  Resp: 19  Temp: (!) 95.4 F (35.2 C)  SpO2: 99%       Body mass index is 19.07 kg/m.    ECOG FS:1 - Symptomatic but completely ambulatory  General: Well-developed, well-nourished, no acute distress. Eyes: Pink conjunctiva, anicteric sclera. HEENT: Normocephalic, moist mucous membranes. Lungs: No audible wheezing or coughing. Heart: Regular rate and rhythm. Abdomen: Soft, nontender, no obvious distention. Musculoskeletal: No edema, cyanosis, or clubbing. Neuro: Alert, answering all questions appropriately. Cranial nerves grossly intact. Skin: No rashes or petechiae noted. Psych: Normal affect.   LAB RESULTS:  Lab Results  Component Value Date   NA 135 05/03/2023   K 4.1 05/03/2023   CL 108 05/03/2023   CO2 22 05/03/2023   GLUCOSE 109 (H) 05/03/2023   BUN 25 (H) 05/03/2023   CREATININE 0.74 05/03/2023   CALCIUM 9.8 05/03/2023   PROT 6.4 (L) 05/03/2023   ALBUMIN 3.2 (L) 05/03/2023   AST 28 05/03/2023   ALT 34 05/03/2023   ALKPHOS 79 05/03/2023   BILITOT 0.3 05/03/2023   GFRNONAA >60 05/03/2023   GFRAA 85 12/22/2020    Lab Results  Component Value Date   WBC 8.8 05/03/2023   NEUTROABS 7.3 05/03/2023   HGB 10.7 (L) 05/03/2023   HCT 33.9 (L) 05/03/2023   MCV 88.7 05/03/2023   PLT 188 05/03/2023     STUDIES: No results found.  ASSESSMENT: Stage IIIa adenocarcinoma of the right upper lobe lung.  PLAN:    Stage IIIa adenocarcinoma of the right upper lobe lung: Pathology and imaging reviewed independently confirming diagnosis and stage of disease.  PET scan results from February 14, 2023  reviewed independently with no obvious evidence of metastatic disease.  MRI of the brain  from February 11, 2023 also negative for metastatic disease.  Surgical resection was not an option, therefore we will proceed with concurrent XRT along with weekly carboplatin and Taxol. This will be followed by maintenance durvalumab every 2 weeks for 1 year.  Patient has had port placement.  Continue daily XRT completing treatment on May 27, 2023.  Proceed with cycle 5 of treatment today.  Continue IV fluids and IV Venofer with each weekly treatment.  Return to clinic in 1 week for further evaluation and consideration of cycle 6.   Light sensitivity: Unrelated to treatments.  Patient states this has improved since buying new sunglasses.  Previously recommended follow-up with his eye doctor. Chest pain: Patient does not complain of this today.  Continue oxycodone as needed. Iron deficiency anemia: Hemoglobin trending up and is now 10.7.  Previously patient was noted to have reduced iron stores.  IV Venofer has been added to treatment plan.   Leukocytosis: Resolved.   Weight loss: Improving.  Follow-up with dietary as indicated. Weakness and fatigue: Improving.  Continue IV fluids and Venofer as above.    Patient expressed understanding and was in agreement with this plan. He also understands that He can call clinic at any time with any questions, concerns, or complaints.    Cancer Staging  Adenocarcinoma of upper lobe of right lung [facility]) Staging form: Lung, AJCC 8th Edition - Clinical stage from 03/15/2023: Stage IIIA (cT4, cN0, cM0) - Signed by Jeralyn Ruths, MD on 03/15/2023 Stage prefix: Initial diagnosis   Jeralyn Ruths, MD   05/03/2023 11:56 AM

## 2023-05-03 NOTE — Progress Notes (Signed)
Nutrition Follow-up:  Patient with lung cancer.  Patient receiving concurrent chemotherapy and radiation.    Met with patient during infusion. Reports that his appetite is some better.  Lucila Maine has been preparing meals for him.  Breakfast is usually a wrap with eggs, sausage.  Lunch is peanut butter sandwich. Dinner last night was chicken and Dandridge.  He has been drinking ensure shakes.  "I love those drinks." Drinks mostly water, milk and chocolate milk.  Says that he did not require a cane to walk with as of yesterday.     Medications: reviewed  Labs: reviewed  Anthropometrics:   Weight 140 lb 9.6 oz today  141 lb on 8/6 152 lb on 6/1   NUTRITION DIAGNOSIS: Inadequate oral intake improving    INTERVENTION:  Continue high calorie, high protein foods Continue oral nutrition supplement shakes, 350 calories or higher     MONITORING, EVALUATION, GOAL: weight trends, intake   NEXT VISIT: Tuesday, Sept 10 during infusion  Shaelee Forni B. Freida Busman, RD, LDN Registered Dietitian [phone]

## 2023-05-03 NOTE — Patient Instructions (Signed)
[facility]  Discharge Instructions: Thank you for choosing [facility] to provide your oncology and hematology care.  If you have a lab appointment with the Cancer Center, please go directly to the Cancer Center and check in at the registration area.  Wear comfortable clothing and clothing appropriate for easy access to any Portacath or PICC line.   We strive to give you quality time with your provider. You may need to reschedule your appointment if you arrive late (15 or more minutes).  Arriving late affects you and other patients whose appointments are after yours.  Also, if you miss three or more appointments without notifying the office, you may be dismissed from the clinic at the provider's discretion.      For prescription refill requests, have your pharmacy contact our office and allow 72 hours for refills to be completed.    Today you received the following chemotherapy and/or immunotherapy agents taxol and carboplatin      To help prevent nausea and vomiting after your treatment, we encourage you to take your nausea medication as directed.  BELOW ARE SYMPTOMS THAT SHOULD BE REPORTED IMMEDIATELY: *FEVER GREATER THAN 100.4 F (38 C) OR HIGHER *CHILLS OR SWEATING *NAUSEA AND VOMITING THAT IS NOT CONTROLLED WITH YOUR NAUSEA MEDICATION *UNUSUAL SHORTNESS OF BREATH *UNUSUAL BRUISING OR BLEEDING *URINARY PROBLEMS (pain or burning when urinating, or frequent urination) *BOWEL PROBLEMS (unusual diarrhea, constipation, pain near the anus) TENDERNESS IN MOUTH AND THROAT WITH OR WITHOUT PRESENCE OF ULCERS (sore throat, sores in mouth, or a toothache) UNUSUAL RASH, SWELLING OR PAIN  UNUSUAL VAGINAL DISCHARGE OR ITCHING   Items with * indicate a potential emergency and should be followed up as soon as possible or go to the Emergency Department if any problems should occur.  Please show the CHEMOTHERAPY ALERT CARD or IMMUNOTHERAPY ALERT CARD at  check-in to the Emergency Department and triage nurse.  Should you have questions after your visit or need to cancel or reschedule your appointment, please contact [facility]  [phone] and follow the prompts.  Office hours are 8:00 a.m. to 4:30 p.m. Monday - Friday. Please note that voicemails left after 4:00 p.m. may not be returned until the following business day.  We are closed weekends and major holidays. You have access to a nurse at all times for urgent questions. Please call the main number to the clinic [phone] and follow the prompts.  For any non-urgent questions, you may also contact your provider using MyChart. We now offer e-Visits for anyone 68 and older to request care online for non-urgent symptoms. For details visit [email or web address].   Also download the MyChart app! Go to the app store, search "MyChart", open the app, select [place], and log in with your MyChart username and password.

## 2023-05-04 ENCOUNTER — Inpatient Hospital Stay: Payer: Medicare HMO | Admitting: Occupational Therapy

## 2023-05-04 ENCOUNTER — Ambulatory Visit
Admission: RE | Admit: 2023-05-04 | Discharge: 2023-05-04 | Disposition: A | Discharge status: Home / Self Care | Payer: Medicare HMO | Source: Ambulatory Visit | Attending: Radiation Oncology | Admitting: Radiation Oncology

## 2023-05-04 ENCOUNTER — Other Ambulatory Visit: Payer: Self-pay | Admitting: *Deleted

## 2023-05-04 ENCOUNTER — Other Ambulatory Visit: Payer: Self-pay

## 2023-05-04 ENCOUNTER — Other Ambulatory Visit: Payer: Self-pay | Admitting: Radiation Oncology

## 2023-05-04 DIAGNOSIS — J449 Chronic obstructive pulmonary disease, unspecified: Secondary | ICD-10-CM | POA: Diagnosis not present

## 2023-05-04 DIAGNOSIS — R42 Dizziness and giddiness: Secondary | ICD-10-CM | POA: Diagnosis not present

## 2023-05-04 DIAGNOSIS — Z87891 Personal history of nicotine dependence: Secondary | ICD-10-CM | POA: Diagnosis not present

## 2023-05-04 DIAGNOSIS — Z510 Encounter for antineoplastic radiation therapy: Secondary | ICD-10-CM | POA: Diagnosis not present

## 2023-05-04 DIAGNOSIS — C3411 Malignant neoplasm of upper lobe, right bronchus or lung: Secondary | ICD-10-CM | POA: Diagnosis not present

## 2023-05-04 DIAGNOSIS — R634 Abnormal weight loss: Secondary | ICD-10-CM | POA: Diagnosis not present

## 2023-05-04 DIAGNOSIS — R079 Chest pain, unspecified: Secondary | ICD-10-CM | POA: Diagnosis not present

## 2023-05-04 DIAGNOSIS — Z8673 Personal history of transient ischemic attack (TIA), and cerebral infarction without residual deficits: Secondary | ICD-10-CM | POA: Diagnosis not present

## 2023-05-04 DIAGNOSIS — Z5111 Encounter for antineoplastic chemotherapy: Secondary | ICD-10-CM | POA: Diagnosis not present

## 2023-05-04 DIAGNOSIS — R531 Weakness: Secondary | ICD-10-CM | POA: Diagnosis not present

## 2023-05-04 LAB — RAD ONC ARIA SESSION SUMMARY
Course Elapsed Days: 28
Plan Fractions Treated to Date: 19
Plan Prescribed Dose Per Fraction: 2 Gy
Plan Total Fractions Prescribed: 35
Plan Total Prescribed Dose: 70 Gy
Reference Point Dosage Given to Date: 38 Gy
Reference Point Session Dosage Given: 2 Gy
Session Number: 19

## 2023-05-04 MED ORDER — SUCRALFATE 1 G PO TABS: 1.0000 g | ORAL_TABLET | Freq: Three times a day (TID) | ORAL | 1 refills | Status: DC

## 2023-05-05 ENCOUNTER — Other Ambulatory Visit: Payer: Self-pay

## 2023-05-05 ENCOUNTER — Ambulatory Visit
Admission: RE | Admit: 2023-05-05 | Discharge: 2023-05-05 | Disposition: A | Discharge status: Home / Self Care | Payer: Medicare HMO | Source: Ambulatory Visit | Attending: Radiation Oncology | Admitting: Radiation Oncology

## 2023-05-05 DIAGNOSIS — R42 Dizziness and giddiness: Secondary | ICD-10-CM | POA: Diagnosis not present

## 2023-05-05 DIAGNOSIS — R079 Chest pain, unspecified: Secondary | ICD-10-CM | POA: Diagnosis not present

## 2023-05-05 DIAGNOSIS — C3411 Malignant neoplasm of upper lobe, right bronchus or lung: Secondary | ICD-10-CM | POA: Diagnosis not present

## 2023-05-05 DIAGNOSIS — Z8673 Personal history of transient ischemic attack (TIA), and cerebral infarction without residual deficits: Secondary | ICD-10-CM | POA: Diagnosis not present

## 2023-05-05 DIAGNOSIS — R531 Weakness: Secondary | ICD-10-CM | POA: Diagnosis not present

## 2023-05-05 DIAGNOSIS — J449 Chronic obstructive pulmonary disease, unspecified: Secondary | ICD-10-CM | POA: Diagnosis not present

## 2023-05-05 DIAGNOSIS — Z5111 Encounter for antineoplastic chemotherapy: Secondary | ICD-10-CM | POA: Diagnosis not present

## 2023-05-05 DIAGNOSIS — Z510 Encounter for antineoplastic radiation therapy: Secondary | ICD-10-CM | POA: Diagnosis not present

## 2023-05-05 DIAGNOSIS — R634 Abnormal weight loss: Secondary | ICD-10-CM | POA: Diagnosis not present

## 2023-05-05 DIAGNOSIS — Z87891 Personal history of nicotine dependence: Secondary | ICD-10-CM | POA: Diagnosis not present

## 2023-05-05 LAB — RAD ONC ARIA SESSION SUMMARY
Course Elapsed Days: 29
Plan Fractions Treated to Date: 20
Plan Prescribed Dose Per Fraction: 2 Gy
Plan Total Fractions Prescribed: 35
Plan Total Prescribed Dose: 70 Gy
Reference Point Dosage Given to Date: 40 Gy
Reference Point Session Dosage Given: 2 Gy
Session Number: 20

## 2023-05-06 ENCOUNTER — Ambulatory Visit
Admission: RE | Admit: 2023-05-06 | Discharge: 2023-05-06 | Disposition: A | Discharge status: Home / Self Care | Payer: Medicare HMO | Source: Ambulatory Visit | Attending: Radiation Oncology | Admitting: Radiation Oncology

## 2023-05-06 ENCOUNTER — Other Ambulatory Visit: Payer: Self-pay

## 2023-05-06 DIAGNOSIS — R531 Weakness: Secondary | ICD-10-CM | POA: Diagnosis not present

## 2023-05-06 DIAGNOSIS — R634 Abnormal weight loss: Secondary | ICD-10-CM | POA: Diagnosis not present

## 2023-05-06 DIAGNOSIS — R42 Dizziness and giddiness: Secondary | ICD-10-CM | POA: Diagnosis not present

## 2023-05-06 DIAGNOSIS — Z8673 Personal history of transient ischemic attack (TIA), and cerebral infarction without residual deficits: Secondary | ICD-10-CM | POA: Diagnosis not present

## 2023-05-06 DIAGNOSIS — C3411 Malignant neoplasm of upper lobe, right bronchus or lung: Secondary | ICD-10-CM | POA: Diagnosis not present

## 2023-05-06 DIAGNOSIS — R079 Chest pain, unspecified: Secondary | ICD-10-CM | POA: Diagnosis not present

## 2023-05-06 DIAGNOSIS — J449 Chronic obstructive pulmonary disease, unspecified: Secondary | ICD-10-CM | POA: Diagnosis not present

## 2023-05-06 DIAGNOSIS — Z5111 Encounter for antineoplastic chemotherapy: Secondary | ICD-10-CM | POA: Diagnosis not present

## 2023-05-06 DIAGNOSIS — Z510 Encounter for antineoplastic radiation therapy: Secondary | ICD-10-CM | POA: Diagnosis not present

## 2023-05-06 DIAGNOSIS — Z87891 Personal history of nicotine dependence: Secondary | ICD-10-CM | POA: Diagnosis not present

## 2023-05-06 LAB — RAD ONC ARIA SESSION SUMMARY
Course Elapsed Days: 30
Plan Fractions Treated to Date: 21
Plan Prescribed Dose Per Fraction: 2 Gy
Plan Total Fractions Prescribed: 35
Plan Total Prescribed Dose: 70 Gy
Reference Point Dosage Given to Date: 42 Gy
Reference Point Session Dosage Given: 2 Gy
Session Number: 21

## 2023-05-07 ENCOUNTER — Ambulatory Visit: Payer: Medicare HMO

## 2023-05-10 ENCOUNTER — Ambulatory Visit: Payer: Medicare HMO

## 2023-05-10 ENCOUNTER — Inpatient Hospital Stay: Payer: Medicare HMO

## 2023-05-10 ENCOUNTER — Encounter: Payer: Self-pay | Admitting: Oncology

## 2023-05-10 ENCOUNTER — Inpatient Hospital Stay: Payer: Medicare HMO | Admitting: Oncology

## 2023-05-10 ENCOUNTER — Other Ambulatory Visit: Payer: Self-pay

## 2023-05-10 ENCOUNTER — Ambulatory Visit
Admission: RE | Admit: 2023-05-10 | Discharge: 2023-05-10 | Disposition: A | Discharge status: Home / Self Care | Payer: Medicare HMO | Source: Ambulatory Visit | Attending: Radiation Oncology | Admitting: Radiation Oncology

## 2023-05-10 DIAGNOSIS — Z5111 Encounter for antineoplastic chemotherapy: Secondary | ICD-10-CM | POA: Insufficient documentation

## 2023-05-10 DIAGNOSIS — R42 Dizziness and giddiness: Secondary | ICD-10-CM | POA: Diagnosis not present

## 2023-05-10 DIAGNOSIS — F17210 Nicotine dependence, cigarettes, uncomplicated: Secondary | ICD-10-CM | POA: Insufficient documentation

## 2023-05-10 DIAGNOSIS — Z8616 Personal history of COVID-19: Secondary | ICD-10-CM | POA: Insufficient documentation

## 2023-05-10 DIAGNOSIS — Z791 Long term (current) use of non-steroidal anti-inflammatories (NSAID): Secondary | ICD-10-CM | POA: Insufficient documentation

## 2023-05-10 DIAGNOSIS — C3411 Malignant neoplasm of upper lobe, right bronchus or lung: Secondary | ICD-10-CM

## 2023-05-10 DIAGNOSIS — Z79899 Other long term (current) drug therapy: Secondary | ICD-10-CM | POA: Diagnosis not present

## 2023-05-10 DIAGNOSIS — N529 Male erectile dysfunction, unspecified: Secondary | ICD-10-CM | POA: Insufficient documentation

## 2023-05-10 DIAGNOSIS — I2510 Atherosclerotic heart disease of native coronary artery without angina pectoris: Secondary | ICD-10-CM | POA: Insufficient documentation

## 2023-05-10 DIAGNOSIS — R531 Weakness: Secondary | ICD-10-CM | POA: Diagnosis not present

## 2023-05-10 DIAGNOSIS — Z8673 Personal history of transient ischemic attack (TIA), and cerebral infarction without residual deficits: Secondary | ICD-10-CM | POA: Insufficient documentation

## 2023-05-10 DIAGNOSIS — R630 Anorexia: Secondary | ICD-10-CM | POA: Insufficient documentation

## 2023-05-10 DIAGNOSIS — Z800 Family history of malignant neoplasm of digestive organs: Secondary | ICD-10-CM | POA: Insufficient documentation

## 2023-05-10 DIAGNOSIS — D72829 Elevated white blood cell count, unspecified: Secondary | ICD-10-CM | POA: Insufficient documentation

## 2023-05-10 DIAGNOSIS — R634 Abnormal weight loss: Secondary | ICD-10-CM | POA: Insufficient documentation

## 2023-05-10 DIAGNOSIS — K8020 Calculus of gallbladder without cholecystitis without obstruction: Secondary | ICD-10-CM | POA: Insufficient documentation

## 2023-05-10 DIAGNOSIS — J449 Chronic obstructive pulmonary disease, unspecified: Secondary | ICD-10-CM | POA: Insufficient documentation

## 2023-05-10 DIAGNOSIS — N200 Calculus of kidney: Secondary | ICD-10-CM | POA: Diagnosis not present

## 2023-05-10 DIAGNOSIS — Z7952 Long term (current) use of systemic steroids: Secondary | ICD-10-CM | POA: Insufficient documentation

## 2023-05-10 DIAGNOSIS — Z510 Encounter for antineoplastic radiation therapy: Secondary | ICD-10-CM | POA: Diagnosis not present

## 2023-05-10 DIAGNOSIS — J432 Centrilobular emphysema: Secondary | ICD-10-CM | POA: Insufficient documentation

## 2023-05-10 DIAGNOSIS — Z87891 Personal history of nicotine dependence: Secondary | ICD-10-CM | POA: Diagnosis not present

## 2023-05-10 DIAGNOSIS — Z7982 Long term (current) use of aspirin: Secondary | ICD-10-CM | POA: Insufficient documentation

## 2023-05-10 DIAGNOSIS — R079 Chest pain, unspecified: Secondary | ICD-10-CM | POA: Insufficient documentation

## 2023-05-10 DIAGNOSIS — I700 Atherosclerosis of aorta: Secondary | ICD-10-CM | POA: Insufficient documentation

## 2023-05-10 DIAGNOSIS — D72819 Decreased white blood cell count, unspecified: Secondary | ICD-10-CM | POA: Insufficient documentation

## 2023-05-10 DIAGNOSIS — G893 Neoplasm related pain (acute) (chronic): Secondary | ICD-10-CM | POA: Diagnosis not present

## 2023-05-10 DIAGNOSIS — D509 Iron deficiency anemia, unspecified: Secondary | ICD-10-CM | POA: Insufficient documentation

## 2023-05-10 DIAGNOSIS — K5900 Constipation, unspecified: Secondary | ICD-10-CM | POA: Insufficient documentation

## 2023-05-10 DIAGNOSIS — R918 Other nonspecific abnormal finding of lung field: Secondary | ICD-10-CM | POA: Diagnosis not present

## 2023-05-10 DIAGNOSIS — I959 Hypotension, unspecified: Secondary | ICD-10-CM | POA: Insufficient documentation

## 2023-05-10 DIAGNOSIS — E871 Hypo-osmolality and hyponatremia: Secondary | ICD-10-CM | POA: Insufficient documentation

## 2023-05-10 DIAGNOSIS — E860 Dehydration: Secondary | ICD-10-CM | POA: Insufficient documentation

## 2023-05-10 LAB — CMP (CANCER CENTER ONLY)
ALT: 26 U/L (ref 0–44)
AST: 24 U/L (ref 15–41)
Albumin: 3.4 g/dL — ABNORMAL LOW (ref 3.5–5.0)
Alkaline Phosphatase: 77 U/L (ref 38–126)
Anion gap: 6 (ref 5–15)
BUN: 20 mg/dL (ref 8–23)
CO2: 21 mmol/L — ABNORMAL LOW (ref 22–32)
Calcium: 9.8 mg/dL (ref 8.9–10.3)
Chloride: 106 mmol/L (ref 98–111)
Creatinine: 0.79 mg/dL (ref 0.61–1.24)
GFR, Estimated: >60 mL/min (ref >60)
Glucose, Bld: 104 mg/dL — ABNORMAL HIGH (ref 70–99)
Potassium: 3.9 mmol/L (ref 3.5–5.1)
Sodium: 133 mmol/L — ABNORMAL LOW (ref 135–145)
Total Bilirubin: 0.7 mg/dL (ref 0.3–1.2)
Total Protein: 6.3 g/dL — ABNORMAL LOW (ref 6.5–8.1)

## 2023-05-10 LAB — RAD ONC ARIA SESSION SUMMARY
Course Elapsed Days: 34
Plan Fractions Treated to Date: 22
Plan Prescribed Dose Per Fraction: 2 Gy
Plan Total Fractions Prescribed: 35
Plan Total Prescribed Dose: 70 Gy
Reference Point Dosage Given to Date: 44 Gy
Reference Point Session Dosage Given: 2 Gy
Session Number: 22

## 2023-05-10 LAB — CBC WITH DIFFERENTIAL (CANCER CENTER ONLY)
Abs Immature Granulocytes: 0.11 10*3/uL — ABNORMAL HIGH (ref 0.00–0.07)
Basophils Absolute: 0 10*3/uL (ref 0.0–0.1)
Basophils Relative: 0 %
Eosinophils Absolute: 0.1 10*3/uL (ref 0.0–0.5)
Eosinophils Relative: 1 %
HCT: 34.6 % — ABNORMAL LOW (ref 39.0–52.0)
Hemoglobin: 10.9 g/dL — ABNORMAL LOW (ref 13.0–17.0)
Immature Granulocytes: 2 %
Lymphocytes Relative: 7 %
Lymphs Abs: 0.5 10*3/uL — ABNORMAL LOW (ref 0.7–4.0)
MCH: 28.2 pg (ref 26.0–34.0)
MCHC: 31.5 g/dL (ref 30.0–36.0)
MCV: 89.6 fL (ref 80.0–100.0)
Monocytes Absolute: 0.3 10*3/uL (ref 0.1–1.0)
Monocytes Relative: 4 %
Neutro Abs: 6.2 10*3/uL (ref 1.7–7.7)
Neutrophils Relative %: 86 %
Platelet Count: 131 10*3/uL — ABNORMAL LOW (ref 150–400)
RBC: 3.86 MIL/uL — ABNORMAL LOW (ref 4.22–5.81)
RDW: 22.6 % — ABNORMAL HIGH (ref 11.5–15.5)
WBC Count: 7.2 10*3/uL (ref 4.0–10.5)
nRBC: 0 % (ref 0.0–0.2)

## 2023-05-10 MED ORDER — SODIUM CHLORIDE 0.9% FLUSH
10.0000 mL | Freq: Once | INTRAVENOUS | Status: AC
  Administered 2023-05-10: 10 mL via INTRAVENOUS
  Filled 2023-05-10: qty 10

## 2023-05-10 MED ORDER — DIPHENHYDRAMINE HCL 50 MG/ML IJ SOLN
25.0000 mg | Freq: Once | INTRAMUSCULAR | Status: AC
  Administered 2023-05-10: 25 mg via INTRAVENOUS
  Filled 2023-05-10: qty 1

## 2023-05-10 MED ORDER — SODIUM CHLORIDE 0.9 % IV SOLN
45.0000 mg/m2 | Freq: Once | INTRAVENOUS | Status: AC
  Administered 2023-05-10: 84 mg via INTRAVENOUS
  Filled 2023-05-10: qty 14

## 2023-05-10 MED ORDER — SODIUM CHLORIDE 0.9 % IV SOLN
10.0000 mg | Freq: Once | INTRAVENOUS | Status: AC
  Administered 2023-05-10: 10 mg via INTRAVENOUS
  Filled 2023-05-10: qty 10

## 2023-05-10 MED ORDER — SODIUM CHLORIDE 0.9 % IV SOLN
Freq: Once | INTRAVENOUS | Status: AC
  Filled 2023-05-10: qty 250

## 2023-05-10 MED ORDER — FAMOTIDINE IN NACL 20-0.9 MG/50ML-% IV SOLN
20.0000 mg | Freq: Once | INTRAVENOUS | Status: AC
  Administered 2023-05-10: 20 mg via INTRAVENOUS
  Filled 2023-05-10: qty 50

## 2023-05-10 MED ORDER — SODIUM CHLORIDE 0.9 % IV SOLN
200.0000 mg | Freq: Once | INTRAVENOUS | Status: AC
  Administered 2023-05-10: 200 mg via INTRAVENOUS
  Filled 2023-05-10: qty 200

## 2023-05-10 MED ORDER — PALONOSETRON HCL INJECTION 0.25 MG/5ML
0.2500 mg | Freq: Once | INTRAVENOUS | Status: AC
  Administered 2023-05-10: 0.25 mg via INTRAVENOUS
  Filled 2023-05-10: qty 5

## 2023-05-10 MED ORDER — SODIUM CHLORIDE 0.9 % IV SOLN
177.4000 mg | Freq: Once | INTRAVENOUS | Status: AC
  Administered 2023-05-10: 180 mg via INTRAVENOUS
  Filled 2023-05-10: qty 18

## 2023-05-10 MED ORDER — HEPARIN SOD (PORK) LOCK FLUSH 100 UNIT/ML IV SOLN
500.0000 [IU] | Freq: Once | INTRAVENOUS | Status: AC
  Administered 2023-05-10: 500 [IU] via INTRAVENOUS
  Filled 2023-05-10: qty 5

## 2023-05-10 MED ORDER — HEPARIN SOD (PORK) LOCK FLUSH 100 UNIT/ML IV SOLN
500.0000 [IU] | Freq: Once | INTRAVENOUS | Status: DC | PRN
  Filled 2023-05-10: qty 5

## 2023-05-10 NOTE — Progress Notes (Signed)
[facility]  [phone] [phone]  ID: JAHSIR KOBASHIGAWA OB: September 13, 1955  MR#: [id number]  AOZ#:[id number]  Patient Care Team: Smitty Cords, DO as PCP - General (Family Medicine) Micki Riley, MD as Referring Physician (Neurology) Glory Buff, RN as Oncology Nurse Navigator Orlie Dakin, Tollie Pizza, MD as Consulting Physician (Oncology)  CHIEF COMPLAINT: Stage IIIa adenocarcinoma of the right upper lobe lung.  INTERVAL HISTORY: Patient returns to clinic today for further evaluation and consideration of cycle 6 of weekly carboplatin and Taxol.  He currently feels well and is asymptomatic.  He is tolerating his treatments well without significant side effects.  He has a fair appetite.  He does not complain of pain today.  He has no neurologic complaints.  He denies any recent fevers or illnesses.   He denies any chest pain, shortness of breath, cough, or hemoptysis.  He has no nausea, vomiting, constipation, or diarrhea.  He has no urinary complaints.  Patient offers no specific complaints today.  REVIEW OF SYSTEMS:   Review of Systems  Constitutional: Negative.  Negative for fever, malaise/fatigue and weight loss.  Respiratory: Negative.  Negative for cough, hemoptysis and shortness of breath.   Cardiovascular: Negative.  Negative for chest pain and leg swelling.  Gastrointestinal: Negative.  Negative for abdominal pain.  Genitourinary: Negative.  Negative for dysuria.  Musculoskeletal: Negative.  Negative for back pain.  Skin: Negative.  Negative for rash.  Neurological: Negative.  Negative for dizziness, focal weakness, weakness and headaches.  Psychiatric/Behavioral: Negative.  The patient is not nervous/anxious.   All other systems reviewed and are negative.   As per HPI. Otherwise, a complete review of systems is negative.  PAST MEDICAL HISTORY: Past Medical History:  Diagnosis Date   COPD (chronic obstructive pulmonary disease)  (HCC)    mild   COVID-19 09/2019   Depression    Erectile dysfunction    Headache    daily, stress headaches   Knee pain, left    Seasonal allergies    Stroke (HCC)    Tobacco dependence     PAST SURGICAL HISTORY: Past Surgical History:  Procedure Laterality Date   BACK SURGERY     metal plate in back   COLONOSCOPY     COLONOSCOPY WITH PROPOFOL N/A 04/26/2019   Procedure: COLONOSCOPY WITH PROPOFOL;  Surgeon: Wyline Mood, MD;  Location: [facility] ENDOSCOPY;  Service: Gastroenterology;  Laterality: N/A;   FINE NEEDLE ASPIRATION  03/03/2023   Procedure: FINE NEEDLE ASPIRATION;  Surgeon: Raechel Chute, MD;  Location: [facility] ENDOSCOPY;  Service: Pulmonary;;   HERNIA REPAIR Right    IR ANGIO INTRA EXTRACRAN SEL COM CAROTID INNOMINATE UNI L MOD SED  03/27/2020   IR ANGIO VERTEBRAL SEL SUBCLAVIAN INNOMINATE UNI L MOD SED  03/27/2020   IR ANGIO VERTEBRAL SEL VERTEBRAL UNI R MOD SED  03/27/2020   IR CT HEAD LTD  03/27/2020   IR IMAGING GUIDED PORT INSERTION  03/29/2023   IR PERCUTANEOUS ART THROMBECTOMY/INFUSION INTRACRANIAL INC DIAG ANGIO  03/27/2020   KNEE ARTHROSCOPY Left 03/28/2015   Procedure: Arthroscopic partial medial meniscectomy plus chondral debridement;  Surgeon: Erin Sons, MD;  Location: [facility];  Service: Orthopedics;  Laterality: Left;   RADIOLOGY WITH ANESTHESIA N/A 03/27/2020   Procedure: IR WITH ANESTHESIA;  Surgeon: Julieanne Cotton, MD;  Location: [facility] OR;  Service: Radiology;  Laterality: N/A;   VIDEO BRONCHOSCOPY WITH ENDOBRONCHIAL ULTRASOUND  03/03/2023   Procedure: VIDEO BRONCHOSCOPY WITH ENDOBRONCHIAL ULTRASOUND;  Surgeon: Raechel Chute, MD;  Location: [facility] ENDOSCOPY;  Service: Pulmonary;;    FAMILY HISTORY: Family History  Problem Relation Age of Onset   Diabetes Mother    Heart attack Mother 89   Cancer Father        liver    COPD Brother    HIV/AIDS Brother    Prostate cancer Neg Hx    Colon cancer Neg Hx     ADVANCED DIRECTIVES (Y/N):  N  HEALTH  MAINTENANCE: Social History   Tobacco Use   Smoking status: Former    Current packs/day: 0.00    Average packs/day: 1.5 packs/day for 49.0 years (73.5 ttl pk-yrs)    Types: Cigarettes    Start date: 87    Quit date: 09/14/2017    Years since quitting: 5.6   Smokeless tobacco: Former  Building services engineer status: Never Used  Substance Use Topics   Alcohol use: Not Currently    Comment: 6 pack on weekend   Drug use: No     Colonoscopy:  PAP:  Bone density:  Lipid panel:  No Known Allergies  Current Outpatient Medications  Medication Sig Dispense Refill   albuterol (VENTOLIN HFA) 108 (90 Base) MCG/ACT inhaler Inhale 2 puffs into the lungs every 4 (four) hours as needed for wheezing or shortness of breath. 1 each 2   b complex vitamins capsule Take 1 capsule by mouth daily.     Cholecalciferol (VITAMIN D) 50 MCG (2000 UT) tablet Take 2,000 Units by mouth daily.     dexamethasone (DECADRON) 4 MG tablet Take 1 tablet (4 mg total) by mouth daily. 30 tablet 2   diphenhydrAMINE (BENADRYL) 50 MG tablet Take 50-100 mg by mouth at bedtime as needed for sleep.     fludrocortisone (FLORINEF) 0.1 MG tablet Take 0.1 mg by mouth daily.     fluticasone (FLONASE) 50 MCG/ACT nasal spray Place 2 sprays into both nostrils daily. (Patient taking differently: Place 2 sprays into both nostrils daily as needed for allergies.) 48 g 3   lidocaine-prilocaine (EMLA) cream Apply to affected area once 30 g 3   loratadine (CLARITIN) 10 MG tablet Take 1 tablet (10 mg total) by mouth daily. Use for 4-6 weeks then stop, and use as needed or seasonally (Patient taking differently: Take 10 mg by mouth daily as needed for allergies.) 30 tablet 11   melatonin 5 MG TABS Take 5 mg by mouth at bedtime as needed (sleep).     methocarbamol (ROBAXIN) 500 MG tablet Take 1 tablet (500 mg total) by mouth 4 (four) times daily. 20 tablet 0   midodrine (PROAMATINE) 5 MG tablet Take 10 mg by mouth 3 (three) times daily with  meals.     Multiple Vitamin (MULTIVITAMIN WITH MINERALS) TABS tablet Take 1 tablet by mouth daily.     Multiple Vitamins-Minerals (IMMUNE SUPPORT VITAMIN C PO) Take 1 tablet by mouth daily.     naproxen (NAPROSYN) 500 MG tablet Take 1 tablet (500 mg total) by mouth 2 (two) times daily with a meal. 10 tablet 0   ondansetron (ZOFRAN) 8 MG tablet Take 1 tablet (8 mg total) by mouth every 8 (eight) hours as needed for nausea or vomiting. Start on the third day after chemotherapy. 60 tablet 2   OVER THE COUNTER MEDICATION Take 5 mLs by mouth every other day. [employer] vegan liquid iron     Oxycodone HCl 10 MG TABS Take 1 tablet (10 mg total) by mouth every 6 (six) hours as needed. Take  with stool softeners to avoid constipation 120 tablet 0   polyethylene glycol (MIRALAX) 17 g packet Take 17 g by mouth 2 (two) times daily as needed for mild constipation or moderate constipation (Hold if diarrhea.). 100 each 2   prochlorperazine (COMPAZINE) 10 MG tablet Take 1 tablet (10 mg total) by mouth every 6 (six) hours as needed for nausea or vomiting. 60 tablet 2   rosuvastatin (CRESTOR) 40 MG tablet Take 1 tablet (40 mg total) by mouth every other day. 45 tablet 3   senna (SENOKOT) 8.6 MG TABS tablet Take 1-2 tablets (8.6-17.2 mg total) by mouth 2 (two) times daily as needed for mild constipation or moderate constipation (Hold if diarrhea). 120 tablet 0   sildenafil (REVATIO) 20 MG tablet TAKE 1 TO 5 TABLETS BY MOUTH ABOUT 30 MINUTES PRIOR TO SEX.START WITH 1 THEN INCREASE 30 tablet 5   STIOLTO RESPIMAT 2.5-2.5 MCG/ACT AERS INHALE 2 PUFFS INTO THE LUNGS DAILY. 12 g 3   sucralfate (CARAFATE) 1 g tablet Take 1 tablet (1 g total) by mouth 3 (three) times daily before meals. 90 tablet 1   aspirin EC 325 MG EC tablet Take 1 tablet (325 mg total) by mouth daily. (Patient not taking: Reported on 04/05/2023) 90 tablet 0   No current facility-administered medications for this visit.   Facility-Administered Medications  Ordered in Other Visits  Medication Dose Route Frequency Provider Last Rate Last Admin   CARBOplatin (PARAPLATIN) 180 mg in sodium chloride 0.9 % 100 mL chemo infusion  180 mg Intravenous Once Jeralyn Ruths, MD       heparin lock flush 100 unit/mL  500 Units Intravenous Once Orlie Dakin, Tollie Pizza, MD       heparin lock flush 100 unit/mL  500 Units Intracatheter Once PRN Jeralyn Ruths, MD       PACLitaxel (TAXOL) 84 mg in sodium chloride 0.9 % 250 mL chemo infusion (</= 80mg /m2)  45 mg/m2 (Treatment Plan Recorded) Intravenous Once Jeralyn Ruths, MD 264 mL/hr at 05/10/23 1154 84 mg at 05/10/23 1154    OBJECTIVE: Vitals:   05/10/23 0913  BP: 117/82  Pulse: 62  Resp: 16  Temp: (!) 97.1 F (36.2 C)  SpO2: 100%       Body mass index is 19.38 kg/m.    ECOG FS:1 - Symptomatic but completely ambulatory  General: Well-developed, well-nourished, no acute distress. Eyes: Pink conjunctiva, anicteric sclera. HEENT: Normocephalic, moist mucous membranes. Lungs: No audible wheezing or coughing. Heart: Regular rate and rhythm. Abdomen: Soft, nontender, no obvious distention. Musculoskeletal: No edema, cyanosis, or clubbing. Neuro: Alert, answering all questions appropriately. Cranial nerves grossly intact. Skin: No rashes or petechiae noted. Psych: Normal affect.   LAB RESULTS:  Lab Results  Component Value Date   NA 133 (L) 05/10/2023   K 3.9 05/10/2023   CL 106 05/10/2023   CO2 21 (L) 05/10/2023   GLUCOSE 104 (H) 05/10/2023   BUN 20 05/10/2023   CREATININE 0.79 05/10/2023   CALCIUM 9.8 05/10/2023   PROT 6.3 (L) 05/10/2023   ALBUMIN 3.4 (L) 05/10/2023   AST 24 05/10/2023   ALT 26 05/10/2023   ALKPHOS 77 05/10/2023   BILITOT 0.7 05/10/2023   GFRNONAA >60 05/10/2023   GFRAA 85 12/22/2020    Lab Results  Component Value Date   WBC 7.2 05/10/2023   NEUTROABS 6.2 05/10/2023   HGB 10.9 (L) 05/10/2023   HCT 34.6 (L) 05/10/2023   MCV 89.6 05/10/2023   PLT 131  (L)  05/10/2023     STUDIES: No results found.  ASSESSMENT: Stage IIIa adenocarcinoma of the right upper lobe lung.  PLAN:    Stage IIIa adenocarcinoma of the right upper lobe lung: Pathology and imaging reviewed independently confirming diagnosis and stage of disease.  PET scan results from February 14, 2023 reviewed independently with no obvious evidence of metastatic disease.  MRI of the brain from February 11, 2023 also negative for metastatic disease.  Surgical resection was not an option, therefore we will proceed with concurrent XRT along with weekly carboplatin and Taxol. This will be followed by maintenance durvalumab every 2 weeks for 1 year.  Patient has had port placement.  Continue daily XRT completing treatment on May 27, 2023.  Proceed with cycle 6 of treatment today.  Continue IV fluids and IV Venofer with each weekly treatment.  Return to clinic in 1 week for further evaluation and consideration of cycle 7. Light sensitivity: Patient does not complain of this today.  Unrelated to treatments.  Patient states this has improved since buying new sunglasses.  Previously recommended follow-up with his eye doctor. Chest pain: Patient does not complain of this today.  Continue oxycodone as needed. Iron deficiency anemia: Hemoglobin 10.9 today.  Previously patient was noted to have reduced iron stores.  IV Venofer has been added to treatment plan.   Leukocytosis: Resolved.   Weight loss: Improving.  Follow-up with dietary as indicated. Weakness and fatigue: Patient does not complain of this today.  Continue IV fluids and Venofer as above.    Patient expressed understanding and was in agreement with this plan. He also understands that He can call clinic at any time with any questions, concerns, or complaints.    Cancer Staging  Adenocarcinoma of upper lobe of right lung [facility]) Staging form: Lung, AJCC 8th Edition - Clinical stage from 03/15/2023: Stage IIIA (cT4, cN0, cM0) - Signed by  Jeralyn Ruths, MD on 03/15/2023 Stage prefix: Initial diagnosis   Jeralyn Ruths, MD   05/10/2023 12:16 PM

## 2023-05-10 NOTE — Patient Instructions (Signed)
[facility]  Discharge Instructions: Thank you for choosing [facility] to provide your oncology and hematology care.  If you have a lab appointment with the Cancer Center, please go directly to the Cancer Center and check in at the registration area.  Wear comfortable clothing and clothing appropriate for easy access to any Portacath or PICC line.   We strive to give you quality time with your provider. You may need to reschedule your appointment if you arrive late (15 or more minutes).  Arriving late affects you and other patients whose appointments are after yours.  Also, if you miss three or more appointments without notifying the office, you may be dismissed from the clinic at the provider's discretion.      For prescription refill requests, have your pharmacy contact our office and allow 72 hours for refills to be completed.    Today you received the following chemotherapy and/or immunotherapy agents IV FLUIDS, VENOFER, TAXOL, CARBOPLATIN      To help prevent nausea and vomiting after your treatment, we encourage you to take your nausea medication as directed.  BELOW ARE SYMPTOMS THAT SHOULD BE REPORTED IMMEDIATELY: *FEVER GREATER THAN 100.4 F (38 C) OR HIGHER *CHILLS OR SWEATING *NAUSEA AND VOMITING THAT IS NOT CONTROLLED WITH YOUR NAUSEA MEDICATION *UNUSUAL SHORTNESS OF BREATH *UNUSUAL BRUISING OR BLEEDING *URINARY PROBLEMS (pain or burning when urinating, or frequent urination) *BOWEL PROBLEMS (unusual diarrhea, constipation, pain near the anus) TENDERNESS IN MOUTH AND THROAT WITH OR WITHOUT PRESENCE OF ULCERS (sore throat, sores in mouth, or a toothache) UNUSUAL RASH, SWELLING OR PAIN  UNUSUAL VAGINAL DISCHARGE OR ITCHING   Items with * indicate a potential emergency and should be followed up as soon as possible or go to the Emergency Department if any problems should occur.  Please show the CHEMOTHERAPY ALERT CARD or  IMMUNOTHERAPY ALERT CARD at check-in to the Emergency Department and triage nurse.  Should you have questions after your visit or need to cancel or reschedule your appointment, please contact [facility]  [phone] and follow the prompts.  Office hours are 8:00 a.m. to 4:30 p.m. Monday - Friday. Please note that voicemails left after 4:00 p.m. may not be returned until the following business day.  We are closed weekends and major holidays. You have access to a nurse at all times for urgent questions. Please call the main number to the clinic [phone] and follow the prompts.  For any non-urgent questions, you may also contact your provider using MyChart. We now offer e-Visits for anyone 37 and older to request care online for non-urgent symptoms. For details visit [email or web address].   Also download the MyChart app! Go to the app store, search "MyChart", open the app, select [place], and log in with your MyChart username and password.  Iron Sucrose Injection What is this medication? IRON SUCROSE (EYE ern SOO krose) treats low levels of iron (iron deficiency anemia) in people with kidney disease. Iron is a mineral that plays an important role in making red blood cells, which carry oxygen from your lungs to the rest of your body. This medicine may be used for other purposes; ask your health care provider or pharmacist if you have questions. COMMON BRAND NAME(S): Venofer What should I tell my care team before I take this medication? They need to know if you have any of these conditions: Anemia not caused by low iron levels Heart disease High levels of  iron in the blood Kidney disease Liver disease An unusual or allergic reaction to iron, other medications, foods, dyes, or preservatives Pregnant or trying to get pregnant Breastfeeding How should I use this medication? This medication is for infusion into a vein. It is given in a hospital or clinic  setting. Talk to your care team about the use of this medication in children. While this medication may be prescribed for children as young as 2 years for selected conditions, precautions do apply. Overdosage: If you think you have taken too much of this medicine contact a poison control center or emergency room at once. NOTE: This medicine is only for you. Do not share this medicine with others. What if I miss a dose? Keep appointments for follow-up doses. It is important not to miss your dose. Call your care team if you are unable to keep an appointment. What may interact with this medication? Do not take this medication with any of the following: Deferoxamine Dimercaprol Other iron products This medication may also interact with the following: Chloramphenicol Deferasirox This list may not describe all possible interactions. Give your health care provider a list of all the medicines, herbs, non-prescription drugs, or dietary supplements you use. Also tell them if you smoke, drink alcohol, or use illegal drugs. Some items may interact with your medicine. What should I watch for while using this medication? Visit your care team regularly. Tell your care team if your symptoms do not start to get better or if they get worse. You may need blood work done while you are taking this medication. You may need to follow a special diet. Talk to your care team. Foods that contain iron include: whole grains/cereals, dried fruits, beans, or peas, leafy green vegetables, and organ meats (liver, kidney). What side effects may I notice from receiving this medication? Side effects that you should report to your care team as soon as possible: Allergic reactions--skin rash, itching, hives, swelling of the face, lips, tongue, or throat Low blood pressure--dizziness, feeling faint or lightheaded, blurry vision Shortness of breath Side effects that usually do not require medical attention (report to your care team  if they continue or are bothersome): Flushing Headache Joint pain Muscle pain Nausea Pain, redness, or irritation at injection site This list may not describe all possible side effects. Call your doctor for medical advice about side effects. You may report side effects to FDA at 1-800-FDA-1088. Where should I keep my medication? This medication is given in a hospital or clinic. It will not be stored at home. NOTE: This sheet is a summary. It may not cover all possible information. If you have questions about this medicine, talk to your doctor, pharmacist, or health care provider.  2024 Elsevier/Gold Standard (2023-01-28 00:00:00)  Paclitaxel Injection What is this medication? PACLITAXEL (PAK li TAX el) treats some types of cancer. It works by slowing down the growth of cancer cells. This medicine may be used for other purposes; ask your health care provider or pharmacist if you have questions. COMMON BRAND NAME(S): Onxol, Taxol What should I tell my care team before I take this medication? They need to know if you have any of these conditions: Heart disease Liver disease Low white blood cell levels An unusual or allergic reaction to paclitaxel, other medications, foods, dyes, or preservatives If you or your partner are pregnant or trying to get pregnant Breast-feeding How should I use this medication? This medication is injected into a vein. It is given by your  care team in a hospital or clinic setting. Talk to your care team about the use of this medication in children. While it may be given to children for selected conditions, precautions do apply. Overdosage: If you think you have taken too much of this medicine contact a poison control center or emergency room at once. NOTE: This medicine is only for you. Do not share this medicine with others. What if I miss a dose? Keep appointments for follow-up doses. It is important not to miss your dose. Call your care team if you are  unable to keep an appointment. What may interact with this medication? Do not take this medication with any of the following: Live virus vaccines Other medications may affect the way this medication works. Talk with your care team about all of the medications you take. They may suggest changes to your treatment plan to lower the risk of side effects and to make sure your medications work as intended. This list may not describe all possible interactions. Give your health care provider a list of all the medicines, herbs, non-prescription drugs, or dietary supplements you use. Also tell them if you smoke, drink alcohol, or use illegal drugs. Some items may interact with your medicine. What should I watch for while using this medication? Your condition will be monitored carefully while you are receiving this medication. You may need blood work while taking this medication. This medication may make you feel generally unwell. This is not uncommon as chemotherapy can affect healthy cells as well as cancer cells. Report any side effects. Continue your course of treatment even though you feel ill unless your care team tells you to stop. This medication can cause serious allergic reactions. To reduce the risk, your care team may give you other medications to take before receiving this one. Be sure to follow the directions from your care team. This medication may increase your risk of getting an infection. Call your care team for advice if you get a fever, chills, sore throat, or other symptoms of a cold or flu. Do not treat yourself. Try to avoid being around people who are sick. This medication may increase your risk to bruise or bleed. Call your care team if you notice any unusual bleeding. Be careful brushing or flossing your teeth or using a toothpick because you may get an infection or bleed more easily. If you have any dental work done, tell your dentist you are receiving this medication. Talk to your care  team if you may be pregnant. Serious birth defects can occur if you take this medication during pregnancy. Talk to your care team before breastfeeding. Changes to your treatment plan may be needed. What side effects may I notice from receiving this medication? Side effects that you should report to your care team as soon as possible: Allergic reactions--skin rash, itching, hives, swelling of the face, lips, tongue, or throat Heart rhythm changes--fast or irregular heartbeat, dizziness, feeling faint or lightheaded, chest pain, trouble breathing Increase in blood pressure Infection--fever, chills, cough, sore throat, wounds that don't heal, pain or trouble when passing urine, general feeling of discomfort or being unwell Low blood pressure--dizziness, feeling faint or lightheaded, blurry vision Low red blood cell level--unusual weakness or fatigue, dizziness, headache, trouble breathing Painful swelling, warmth, or redness of the skin, blisters or sores at the infusion site Pain, tingling, or numbness in the hands or feet Slow heartbeat--dizziness, feeling faint or lightheaded, confusion, trouble breathing, unusual weakness or fatigue Unusual bruising or  bleeding Side effects that usually do not require medical attention (report to your care team if they continue or are bothersome): Diarrhea Hair loss Joint pain Loss of appetite Muscle pain Nausea Vomiting This list may not describe all possible side effects. Call your doctor for medical advice about side effects. You may report side effects to FDA at 1-800-FDA-1088. Where should I keep my medication? This medication is given in a hospital or clinic. It will not be stored at home. NOTE: This sheet is a summary. It may not cover all possible information. If you have questions about this medicine, talk to your doctor, pharmacist, or health care provider.  2024 Elsevier/Gold Standard (2022-01-12 00:00:00)  Carboplatin Injection What is this  medication? CARBOPLATIN (KAR boe pla tin) treats some types of cancer. It works by slowing down the growth of cancer cells. This medicine may be used for other purposes; ask your health care provider or pharmacist if you have questions. COMMON BRAND NAME(S): Paraplatin What should I tell my care team before I take this medication? They need to know if you have any of these conditions: Blood disorders Hearing problems Kidney disease Recent or ongoing radiation therapy An unusual or allergic reaction to carboplatin, cisplatin, other medications, foods, dyes, or preservatives Pregnant or trying to get pregnant Breast-feeding How should I use this medication? This medication is injected into a vein. It is given by your care team in a hospital or clinic setting. Talk to your care team about the use of this medication in children. Special care may be needed. Overdosage: If you think you have taken too much of this medicine contact a poison control center or emergency room at once. NOTE: This medicine is only for you. Do not share this medicine with others. What if I miss a dose? Keep appointments for follow-up doses. It is important not to miss your dose. Call your care team if you are unable to keep an appointment. What may interact with this medication? Medications for seizures Some antibiotics, such as amikacin, gentamicin, neomycin, streptomycin, tobramycin Vaccines This list may not describe all possible interactions. Give your health care provider a list of all the medicines, herbs, non-prescription drugs, or dietary supplements you use. Also tell them if you smoke, drink alcohol, or use illegal drugs. Some items may interact with your medicine. What should I watch for while using this medication? Your condition will be monitored carefully while you are receiving this medication. You may need blood work while taking this medication. This medication may make you feel generally unwell. This  is not uncommon, as chemotherapy can affect healthy cells as well as cancer cells. Report any side effects. Continue your course of treatment even though you feel ill unless your care team tells you to stop. In some cases, you may be given additional medications to help with side effects. Follow all directions for their use. This medication may increase your risk of getting an infection. Call your care team for advice if you get a fever, chills, sore throat, or other symptoms of a cold or flu. Do not treat yourself. Try to avoid being around people who are sick. Avoid taking medications that contain aspirin, acetaminophen, ibuprofen, naproxen, or ketoprofen unless instructed by your care team. These medications may hide a fever. Be careful brushing or flossing your teeth or using a toothpick because you may get an infection or bleed more easily. If you have any dental work done, tell your dentist you are receiving this medication. Talk  to your care team if you wish to become pregnant or think you might be pregnant. This medication can cause serious birth defects. Talk to your care team about effective forms of contraception. Do not breast-feed while taking this medication. What side effects may I notice from receiving this medication? Side effects that you should report to your care team as soon as possible: Allergic reactions--skin rash, itching, hives, swelling of the face, lips, tongue, or throat Infection--fever, chills, cough, sore throat, wounds that don't heal, pain or trouble when passing urine, general feeling of discomfort or being unwell Low red blood cell level--unusual weakness or fatigue, dizziness, headache, trouble breathing Pain, tingling, or numbness in the hands or feet, muscle weakness, change in vision, confusion or trouble speaking, loss of balance or coordination, trouble walking, seizures Unusual bruising or bleeding Side effects that usually do not require medical attention  (report to your care team if they continue or are bothersome): Hair loss Nausea Unusual weakness or fatigue Vomiting This list may not describe all possible side effects. Call your doctor for medical advice about side effects. You may report side effects to FDA at 1-800-FDA-1088. Where should I keep my medication? This medication is given in a hospital or clinic. It will not be stored at home. NOTE: This sheet is a summary. It may not cover all possible information. If you have questions about this medicine, talk to your doctor, pharmacist, or health care provider.  2024 Elsevier/Gold Standard (2021-12-15 00:00:00)

## 2023-05-10 NOTE — Progress Notes (Signed)
States that he has "spots" popping up on his skin on his abdomen and hands that he would like checked out.

## 2023-05-11 ENCOUNTER — Ambulatory Visit
Admission: RE | Admit: 2023-05-11 | Discharge: 2023-05-11 | Disposition: A | Discharge status: Home / Self Care | Payer: Medicare HMO | Source: Ambulatory Visit | Attending: Radiation Oncology | Admitting: Radiation Oncology

## 2023-05-11 ENCOUNTER — Other Ambulatory Visit: Payer: Self-pay

## 2023-05-11 DIAGNOSIS — Z510 Encounter for antineoplastic radiation therapy: Secondary | ICD-10-CM | POA: Diagnosis not present

## 2023-05-11 DIAGNOSIS — J449 Chronic obstructive pulmonary disease, unspecified: Secondary | ICD-10-CM | POA: Diagnosis not present

## 2023-05-11 DIAGNOSIS — C3411 Malignant neoplasm of upper lobe, right bronchus or lung: Secondary | ICD-10-CM | POA: Diagnosis not present

## 2023-05-11 DIAGNOSIS — R079 Chest pain, unspecified: Secondary | ICD-10-CM | POA: Diagnosis not present

## 2023-05-11 DIAGNOSIS — Z5111 Encounter for antineoplastic chemotherapy: Secondary | ICD-10-CM | POA: Diagnosis not present

## 2023-05-11 DIAGNOSIS — R42 Dizziness and giddiness: Secondary | ICD-10-CM | POA: Diagnosis not present

## 2023-05-11 DIAGNOSIS — Z8673 Personal history of transient ischemic attack (TIA), and cerebral infarction without residual deficits: Secondary | ICD-10-CM | POA: Diagnosis not present

## 2023-05-11 DIAGNOSIS — Z87891 Personal history of nicotine dependence: Secondary | ICD-10-CM | POA: Diagnosis not present

## 2023-05-11 DIAGNOSIS — R634 Abnormal weight loss: Secondary | ICD-10-CM | POA: Diagnosis not present

## 2023-05-11 DIAGNOSIS — R531 Weakness: Secondary | ICD-10-CM | POA: Diagnosis not present

## 2023-05-11 LAB — RAD ONC ARIA SESSION SUMMARY
Course Elapsed Days: 35
Plan Fractions Treated to Date: 23
Plan Prescribed Dose Per Fraction: 2 Gy
Plan Total Fractions Prescribed: 35
Plan Total Prescribed Dose: 70 Gy
Reference Point Dosage Given to Date: 46 Gy
Reference Point Session Dosage Given: 2 Gy
Session Number: 23

## 2023-05-12 ENCOUNTER — Other Ambulatory Visit: Payer: Self-pay

## 2023-05-12 ENCOUNTER — Ambulatory Visit
Admission: RE | Admit: 2023-05-12 | Discharge: 2023-05-12 | Disposition: A | Discharge status: Home / Self Care | Payer: Medicare HMO | Source: Ambulatory Visit | Attending: Radiation Oncology | Admitting: Radiation Oncology

## 2023-05-12 ENCOUNTER — Encounter: Payer: Self-pay | Admitting: Oncology

## 2023-05-12 DIAGNOSIS — Z5111 Encounter for antineoplastic chemotherapy: Secondary | ICD-10-CM | POA: Diagnosis not present

## 2023-05-12 DIAGNOSIS — C3411 Malignant neoplasm of upper lobe, right bronchus or lung: Secondary | ICD-10-CM | POA: Diagnosis not present

## 2023-05-12 DIAGNOSIS — Z87891 Personal history of nicotine dependence: Secondary | ICD-10-CM | POA: Diagnosis not present

## 2023-05-12 DIAGNOSIS — Z510 Encounter for antineoplastic radiation therapy: Secondary | ICD-10-CM | POA: Diagnosis not present

## 2023-05-12 DIAGNOSIS — R634 Abnormal weight loss: Secondary | ICD-10-CM | POA: Diagnosis not present

## 2023-05-12 DIAGNOSIS — R42 Dizziness and giddiness: Secondary | ICD-10-CM | POA: Diagnosis not present

## 2023-05-12 DIAGNOSIS — Z8673 Personal history of transient ischemic attack (TIA), and cerebral infarction without residual deficits: Secondary | ICD-10-CM | POA: Diagnosis not present

## 2023-05-12 DIAGNOSIS — J449 Chronic obstructive pulmonary disease, unspecified: Secondary | ICD-10-CM | POA: Diagnosis not present

## 2023-05-12 DIAGNOSIS — R079 Chest pain, unspecified: Secondary | ICD-10-CM | POA: Diagnosis not present

## 2023-05-12 DIAGNOSIS — R531 Weakness: Secondary | ICD-10-CM | POA: Diagnosis not present

## 2023-05-12 LAB — RAD ONC ARIA SESSION SUMMARY
Course Elapsed Days: 36
Plan Fractions Treated to Date: 24
Plan Prescribed Dose Per Fraction: 2 Gy
Plan Total Fractions Prescribed: 35
Plan Total Prescribed Dose: 70 Gy
Reference Point Dosage Given to Date: 48 Gy
Reference Point Session Dosage Given: 2 Gy
Session Number: 24

## 2023-05-13 ENCOUNTER — Other Ambulatory Visit: Payer: Self-pay

## 2023-05-13 ENCOUNTER — Ambulatory Visit
Admission: RE | Admit: 2023-05-13 | Discharge: 2023-05-13 | Disposition: A | Discharge status: Home / Self Care | Payer: Medicare HMO | Source: Ambulatory Visit | Attending: Radiation Oncology | Admitting: Radiation Oncology

## 2023-05-13 DIAGNOSIS — R634 Abnormal weight loss: Secondary | ICD-10-CM | POA: Diagnosis not present

## 2023-05-13 DIAGNOSIS — Z8673 Personal history of transient ischemic attack (TIA), and cerebral infarction without residual deficits: Secondary | ICD-10-CM | POA: Diagnosis not present

## 2023-05-13 DIAGNOSIS — Z5111 Encounter for antineoplastic chemotherapy: Secondary | ICD-10-CM | POA: Diagnosis not present

## 2023-05-13 DIAGNOSIS — C3411 Malignant neoplasm of upper lobe, right bronchus or lung: Secondary | ICD-10-CM | POA: Diagnosis not present

## 2023-05-13 DIAGNOSIS — Z87891 Personal history of nicotine dependence: Secondary | ICD-10-CM | POA: Diagnosis not present

## 2023-05-13 DIAGNOSIS — Z510 Encounter for antineoplastic radiation therapy: Secondary | ICD-10-CM | POA: Diagnosis not present

## 2023-05-13 DIAGNOSIS — R531 Weakness: Secondary | ICD-10-CM | POA: Diagnosis not present

## 2023-05-13 DIAGNOSIS — R42 Dizziness and giddiness: Secondary | ICD-10-CM | POA: Diagnosis not present

## 2023-05-13 DIAGNOSIS — J449 Chronic obstructive pulmonary disease, unspecified: Secondary | ICD-10-CM | POA: Diagnosis not present

## 2023-05-13 DIAGNOSIS — R079 Chest pain, unspecified: Secondary | ICD-10-CM | POA: Diagnosis not present

## 2023-05-13 LAB — RAD ONC ARIA SESSION SUMMARY
Course Elapsed Days: 37
Plan Fractions Treated to Date: 25
Plan Prescribed Dose Per Fraction: 2 Gy
Plan Total Fractions Prescribed: 35
Plan Total Prescribed Dose: 70 Gy
Reference Point Dosage Given to Date: 50 Gy
Reference Point Session Dosage Given: 2 Gy
Session Number: 25

## 2023-05-16 ENCOUNTER — Ambulatory Visit
Admission: RE | Admit: 2023-05-16 | Discharge: 2023-05-16 | Disposition: A | Discharge status: Home / Self Care | Payer: Medicare HMO | Source: Ambulatory Visit | Attending: Radiation Oncology | Admitting: Radiation Oncology

## 2023-05-16 ENCOUNTER — Other Ambulatory Visit: Payer: Self-pay

## 2023-05-16 DIAGNOSIS — R634 Abnormal weight loss: Secondary | ICD-10-CM | POA: Diagnosis not present

## 2023-05-16 DIAGNOSIS — R079 Chest pain, unspecified: Secondary | ICD-10-CM | POA: Diagnosis not present

## 2023-05-16 DIAGNOSIS — C3411 Malignant neoplasm of upper lobe, right bronchus or lung: Secondary | ICD-10-CM | POA: Diagnosis not present

## 2023-05-16 DIAGNOSIS — R42 Dizziness and giddiness: Secondary | ICD-10-CM | POA: Diagnosis not present

## 2023-05-16 DIAGNOSIS — Z510 Encounter for antineoplastic radiation therapy: Secondary | ICD-10-CM | POA: Diagnosis not present

## 2023-05-16 DIAGNOSIS — Z5111 Encounter for antineoplastic chemotherapy: Secondary | ICD-10-CM | POA: Diagnosis not present

## 2023-05-16 DIAGNOSIS — Z87891 Personal history of nicotine dependence: Secondary | ICD-10-CM | POA: Diagnosis not present

## 2023-05-16 DIAGNOSIS — J449 Chronic obstructive pulmonary disease, unspecified: Secondary | ICD-10-CM | POA: Diagnosis not present

## 2023-05-16 DIAGNOSIS — Z8673 Personal history of transient ischemic attack (TIA), and cerebral infarction without residual deficits: Secondary | ICD-10-CM | POA: Diagnosis not present

## 2023-05-16 DIAGNOSIS — R531 Weakness: Secondary | ICD-10-CM | POA: Diagnosis not present

## 2023-05-16 LAB — RAD ONC ARIA SESSION SUMMARY
Course Elapsed Days: 40
Plan Fractions Treated to Date: 26
Plan Prescribed Dose Per Fraction: 2 Gy
Plan Total Fractions Prescribed: 35
Plan Total Prescribed Dose: 70 Gy
Reference Point Dosage Given to Date: 52 Gy
Reference Point Session Dosage Given: 2 Gy
Session Number: 26

## 2023-05-16 MED FILL — Dexamethasone Sodium Phosphate Inj 100 MG/10ML: INTRAMUSCULAR | Qty: 1 | Status: AC

## 2023-05-17 ENCOUNTER — Inpatient Hospital Stay: Payer: Medicare HMO

## 2023-05-17 ENCOUNTER — Inpatient Hospital Stay: Payer: Medicare HMO | Admitting: Oncology

## 2023-05-17 ENCOUNTER — Other Ambulatory Visit: Payer: Self-pay

## 2023-05-17 ENCOUNTER — Encounter: Payer: Self-pay | Admitting: Oncology

## 2023-05-17 ENCOUNTER — Ambulatory Visit
Admission: RE | Admit: 2023-05-17 | Discharge: 2023-05-17 | Disposition: A | Discharge status: Home / Self Care | Payer: Medicare HMO | Source: Ambulatory Visit | Attending: Radiation Oncology | Admitting: Radiation Oncology

## 2023-05-17 VITALS — BP 116/86 | HR 67 | Temp 97.5°F | Resp 16 | Ht 72.0 in | Wt 143.0 lb

## 2023-05-17 VITALS — BP 109/75 | HR 86

## 2023-05-17 DIAGNOSIS — R531 Weakness: Secondary | ICD-10-CM | POA: Diagnosis not present

## 2023-05-17 DIAGNOSIS — R42 Dizziness and giddiness: Secondary | ICD-10-CM | POA: Diagnosis not present

## 2023-05-17 DIAGNOSIS — C3411 Malignant neoplasm of upper lobe, right bronchus or lung: Secondary | ICD-10-CM | POA: Diagnosis not present

## 2023-05-17 DIAGNOSIS — Z510 Encounter for antineoplastic radiation therapy: Secondary | ICD-10-CM | POA: Diagnosis not present

## 2023-05-17 DIAGNOSIS — Z8673 Personal history of transient ischemic attack (TIA), and cerebral infarction without residual deficits: Secondary | ICD-10-CM

## 2023-05-17 DIAGNOSIS — Z87891 Personal history of nicotine dependence: Secondary | ICD-10-CM | POA: Diagnosis not present

## 2023-05-17 DIAGNOSIS — Z5111 Encounter for antineoplastic chemotherapy: Secondary | ICD-10-CM | POA: Diagnosis not present

## 2023-05-17 DIAGNOSIS — J449 Chronic obstructive pulmonary disease, unspecified: Secondary | ICD-10-CM | POA: Diagnosis not present

## 2023-05-17 DIAGNOSIS — R634 Abnormal weight loss: Secondary | ICD-10-CM | POA: Diagnosis not present

## 2023-05-17 DIAGNOSIS — R079 Chest pain, unspecified: Secondary | ICD-10-CM | POA: Diagnosis not present

## 2023-05-17 LAB — CMP (CANCER CENTER ONLY)
ALT: 52 U/L — ABNORMAL HIGH (ref 0–44)
AST: 31 U/L (ref 15–41)
Albumin: 3.5 g/dL (ref 3.5–5.0)
Alkaline Phosphatase: 75 U/L (ref 38–126)
Anion gap: 5 (ref 5–15)
BUN: 26 mg/dL — ABNORMAL HIGH (ref 8–23)
CO2: 23 mmol/L (ref 22–32)
Calcium: 10.1 mg/dL (ref 8.9–10.3)
Chloride: 107 mmol/L (ref 98–111)
Creatinine: 0.9 mg/dL (ref 0.61–1.24)
GFR, Estimated: >60 mL/min (ref >60)
Glucose, Bld: 104 mg/dL — ABNORMAL HIGH (ref 70–99)
Potassium: 3.9 mmol/L (ref 3.5–5.1)
Sodium: 135 mmol/L (ref 135–145)
Total Bilirubin: 0.7 mg/dL (ref 0.3–1.2)
Total Protein: 6.6 g/dL (ref 6.5–8.1)

## 2023-05-17 LAB — RAD ONC ARIA SESSION SUMMARY
Course Elapsed Days: 41
Plan Fractions Treated to Date: 27
Plan Prescribed Dose Per Fraction: 2 Gy
Plan Total Fractions Prescribed: 35
Plan Total Prescribed Dose: 70 Gy
Reference Point Dosage Given to Date: 54 Gy
Reference Point Session Dosage Given: 2 Gy
Session Number: 27

## 2023-05-17 LAB — CBC WITH DIFFERENTIAL (CANCER CENTER ONLY)
Abs Immature Granulocytes: 0.04 10*3/uL (ref 0.00–0.07)
Basophils Absolute: 0 10*3/uL (ref 0.0–0.1)
Basophils Relative: 1 %
Eosinophils Absolute: 0 10*3/uL (ref 0.0–0.5)
Eosinophils Relative: 1 %
HCT: 35.2 % — ABNORMAL LOW (ref 39.0–52.0)
Hemoglobin: 11.3 g/dL — ABNORMAL LOW (ref 13.0–17.0)
Immature Granulocytes: 1 %
Lymphocytes Relative: 20 %
Lymphs Abs: 0.9 10*3/uL (ref 0.7–4.0)
MCH: 29.4 pg (ref 26.0–34.0)
MCHC: 32.1 g/dL (ref 30.0–36.0)
MCV: 91.4 fL (ref 80.0–100.0)
Monocytes Absolute: 0.2 10*3/uL (ref 0.1–1.0)
Monocytes Relative: 5 %
Neutro Abs: 3.1 10*3/uL (ref 1.7–7.7)
Neutrophils Relative %: 72 %
Platelet Count: 159 10*3/uL (ref 150–400)
RBC: 3.85 MIL/uL — ABNORMAL LOW (ref 4.22–5.81)
RDW: 23.3 % — ABNORMAL HIGH (ref 11.5–15.5)
WBC Count: 4.3 10*3/uL (ref 4.0–10.5)
nRBC: 0 % (ref 0.0–0.2)

## 2023-05-17 MED ORDER — PALONOSETRON HCL INJECTION 0.25 MG/5ML
0.2500 mg | Freq: Once | INTRAVENOUS | Status: AC
  Administered 2023-05-17: 0.25 mg via INTRAVENOUS
  Filled 2023-05-17: qty 5

## 2023-05-17 MED ORDER — SODIUM CHLORIDE 0.9 % IV SOLN
177.4000 mg | Freq: Once | INTRAVENOUS | Status: DC
  Filled 2023-05-17: qty 18

## 2023-05-17 MED ORDER — SODIUM CHLORIDE 0.9 % IV SOLN
Freq: Once | INTRAVENOUS | Status: AC
  Filled 2023-05-17: qty 250

## 2023-05-17 MED ORDER — HEPARIN SOD (PORK) LOCK FLUSH 100 UNIT/ML IV SOLN
500.0000 [IU] | Freq: Once | INTRAVENOUS | Status: AC | PRN
  Administered 2023-05-17: 500 [IU]
  Filled 2023-05-17: qty 5

## 2023-05-17 MED ORDER — DIPHENHYDRAMINE HCL 50 MG/ML IJ SOLN
25.0000 mg | Freq: Once | INTRAMUSCULAR | Status: AC
  Administered 2023-05-17: 25 mg via INTRAVENOUS
  Filled 2023-05-17: qty 1

## 2023-05-17 MED ORDER — SODIUM CHLORIDE 0.9 % IV SOLN
10.0000 mg | Freq: Once | INTRAVENOUS | Status: AC
  Administered 2023-05-17: 10 mg via INTRAVENOUS
  Filled 2023-05-17: qty 10

## 2023-05-17 MED ORDER — SODIUM CHLORIDE 0.9 % IV SOLN
177.4000 mg | Freq: Once | INTRAVENOUS | Status: AC
  Administered 2023-05-17: 180 mg via INTRAVENOUS
  Filled 2023-05-17: qty 18

## 2023-05-17 MED ORDER — FAMOTIDINE IN NACL 20-0.9 MG/50ML-% IV SOLN
20.0000 mg | Freq: Once | INTRAVENOUS | Status: AC
  Administered 2023-05-17: 20 mg via INTRAVENOUS
  Filled 2023-05-17: qty 50

## 2023-05-17 MED ORDER — SODIUM CHLORIDE 0.9 % IV SOLN
200.0000 mg | Freq: Once | INTRAVENOUS | Status: AC
  Administered 2023-05-17: 200 mg via INTRAVENOUS
  Filled 2023-05-17: qty 200

## 2023-05-17 MED ORDER — SODIUM CHLORIDE 0.9 % IV SOLN
45.0000 mg/m2 | Freq: Once | INTRAVENOUS | Status: AC
  Administered 2023-05-17: 84 mg via INTRAVENOUS
  Filled 2023-05-17: qty 14

## 2023-05-17 NOTE — Progress Notes (Signed)
[facility]  [phone] [phone]  ID: Ethan Fitzgerald OB: May 10, 1956  MR#: [id number]  GMW#:[id number]  Patient Care Team: Smitty Cords, DO as PCP - General (Family Medicine) Micki Riley, MD as Referring Physician (Neurology) Glory Buff, RN as Oncology Nurse Navigator Orlie Dakin, Tollie Pizza, MD as Consulting Physician (Oncology)  CHIEF COMPLAINT: Stage IIIa adenocarcinoma of the right upper lobe lung.  INTERVAL HISTORY: Patient returns to clinic today for further evaluation and consideration of cycle 7 of weekly carboplatinum and Taxol.  He continues with daily XRT as well.  He continues to feel well and remains asymptomatic.  He is tolerating his treatments without significant side effects. He has a fair appetite.  He does not complain of pain today.  He has no new neurologic complaints.  He denies any recent fevers or illnesses.   He denies any chest pain, shortness of breath, cough, or hemoptysis.  He has no nausea, vomiting, constipation, or diarrhea.  He has no urinary complaints.  Patient offers no specific complaints today.  REVIEW OF SYSTEMS:   Review of Systems  Constitutional: Negative.  Negative for fever, malaise/fatigue and weight loss.  Respiratory: Negative.  Negative for cough, hemoptysis and shortness of breath.   Cardiovascular: Negative.  Negative for chest pain and leg swelling.  Gastrointestinal: Negative.  Negative for abdominal pain.  Genitourinary: Negative.  Negative for dysuria.  Musculoskeletal: Negative.  Negative for back pain.  Skin: Negative.  Negative for rash.  Neurological: Negative.  Negative for dizziness, focal weakness, weakness and headaches.  Psychiatric/Behavioral: Negative.  The patient is not nervous/anxious.   All other systems reviewed and are negative.   As per HPI. Otherwise, a complete review of systems is negative.  PAST MEDICAL HISTORY: Past Medical History:  Diagnosis Date    COPD (chronic obstructive pulmonary disease) (HCC)    mild   COVID-19 09/2019   Depression    Erectile dysfunction    Headache    daily, stress headaches   Knee pain, left    Seasonal allergies    Stroke (HCC)    Tobacco dependence     PAST SURGICAL HISTORY: Past Surgical History:  Procedure Laterality Date   BACK SURGERY     metal plate in back   COLONOSCOPY     COLONOSCOPY WITH PROPOFOL N/A 04/26/2019   Procedure: COLONOSCOPY WITH PROPOFOL;  Surgeon: Wyline Mood, MD;  Location: [facility] END[facility]COPY;  Service: Gastroenterology;  Laterality: N/A;   FINE NEEDLE ASPIRATION  03/03/2023   Procedure: FINE NEEDLE ASPIRATION;  Surgeon: Raechel Chute, MD;  Location: [facility] ENDOSCOPY;  Service: Pulmonary;;   HERNIA REPAIR Right    IR ANGIO INTRA EXTRACRAN SEL COM CAROTID INNOMINATE UNI L MOD SED  03/27/2020   IR ANGIO VERTEBRAL SEL SUBCLAVIAN INNOMINATE UNI L MOD SED  03/27/2020   IR ANGIO VERTEBRAL SEL VERTEBRAL UNI R MOD SED  03/27/2020   IR CT HEAD LTD  03/27/2020   IR IMAGING GUIDED PORT INSERTION  03/29/2023   IR PERCUTANEOUS ART THROMBECTOMY/INFUSION INTRACRANIAL INC DIAG ANGIO  03/27/2020   KNEE ARTHROSCOPY Left 03/28/2015   Procedure: Arthroscopic partial medial meniscectomy plus chondral debridement;  Surgeon: Erin Sons, MD;  Location: [facility];  Service: Orthopedics;  Laterality: Left;   RADIOLOGY WITH ANESTHESIA N/A 03/27/2020   Procedure: IR WITH ANESTHESIA;  Surgeon: Julieanne Cotton, MD;  Location: [facility] OR;  Service: Radiology;  Laterality: N/A;   VIDEO BRONCHOSCOPY WITH ENDOBRONCHIAL ULTRASOUND  03/03/2023   Procedure: VIDEO BRONCHOSCOPY WITH  ENDOBRONCHIAL ULTRASOUND;  Surgeon: Raechel Chute, MD;  Location: [facility] ENDOSCOPY;  Service: Pulmonary;;    FAMILY HISTORY: Family History  Problem Relation Age of Onset   Diabetes Mother    Heart attack Mother 34   Cancer Father        liver    COPD Brother    HIV/AIDS Brother    Prostate cancer Neg Hx    Colon cancer Neg Hx      ADVANCED DIRECTIVES (Y/N):  N  HEALTH MAINTENANCE: Social History   Tobacco Use   Smoking status: Former    Current packs/day: 0.00    Average packs/day: 1.5 packs/day for 49.0 years (73.5 ttl pk-yrs)    Types: Cigarettes    Start date: 70    Quit date: 09/14/2017    Years since quitting: 5.6   Smokeless tobacco: Former  Building services engineer status: Never Used  Substance Use Topics   Alcohol use: Not Currently    Comment: 6 pack on weekend   Drug use: No     Colonoscopy:  PAP:  Bone density:  Lipid panel:  No Known Allergies  Current Outpatient Medications  Medication Sig Dispense Refill   albuterol (VENTOLIN HFA) 108 (90 Base) MCG/ACT inhaler Inhale 2 puffs into the lungs every 4 (four) hours as needed for wheezing or shortness of breath. 1 each 2   b complex vitamins capsule Take 1 capsule by mouth daily.     Cholecalciferol (VITAMIN D) 50 MCG (2000 UT) tablet Take 2,000 Units by mouth daily.     dexamethasone (DECADRON) 4 MG tablet Take 1 tablet (4 mg total) by mouth daily. 30 tablet 2   diphenhydrAMINE (BENADRYL) 50 MG tablet Take 50-100 mg by mouth at bedtime as needed for sleep.     fludrocortisone (FLORINEF) 0.1 MG tablet Take 0.1 mg by mouth daily.     fluticasone (FLONASE) 50 MCG/ACT nasal spray Place 2 sprays into both nostrils daily. (Patient taking differently: Place 2 sprays into both nostrils daily as needed for allergies.) 48 g 3   lidocaine-prilocaine (EMLA) cream Apply to affected area once 30 g 3   loratadine (CLARITIN) 10 MG tablet Take 1 tablet (10 mg total) by mouth daily. Use for 4-6 weeks then stop, and use as needed or seasonally (Patient taking differently: Take 10 mg by mouth daily as needed for allergies.) 30 tablet 11   melatonin 5 MG TABS Take 5 mg by mouth at bedtime as needed (sleep).     methocarbamol (ROBAXIN) 500 MG tablet Take 1 tablet (500 mg total) by mouth 4 (four) times daily. 20 tablet 0   midodrine (PROAMATINE) 5 MG tablet Take  10 mg by mouth 3 (three) times daily with meals.     Multiple Vitamin (MULTIVITAMIN WITH MINERALS) TABS tablet Take 1 tablet by mouth daily.     Multiple Vitamins-Minerals (IMMUNE SUPPORT VITAMIN C PO) Take 1 tablet by mouth daily.     naproxen (NAPROSYN) 500 MG tablet Take 1 tablet (500 mg total) by mouth 2 (two) times daily with a meal. 10 tablet 0   ondansetron (ZOFRAN) 8 MG tablet Take 1 tablet (8 mg total) by mouth every 8 (eight) hours as needed for nausea or vomiting. Start on the third day after chemotherapy. 60 tablet 2   OVER THE COUNTER MEDICATION Take 5 mLs by mouth every other day. [employer] vegan liquid iron     Oxycodone HCl 10 MG TABS Take 1 tablet (10 mg total) by  mouth every 6 (six) hours as needed. Take with stool softeners to avoid constipation 120 tablet 0   polyethylene glycol (MIRALAX) 17 g packet Take 17 g by mouth 2 (two) times daily as needed for mild constipation or moderate constipation (Hold if diarrhea.). 100 each 2   prochlorperazine (COMPAZINE) 10 MG tablet Take 1 tablet (10 mg total) by mouth every 6 (six) hours as needed for nausea or vomiting. 60 tablet 2   rosuvastatin (CRESTOR) 40 MG tablet Take 1 tablet (40 mg total) by mouth every other day. 45 tablet 3   senna (SENOKOT) 8.6 MG TABS tablet Take 1-2 tablets (8.6-17.2 mg total) by mouth 2 (two) times daily as needed for mild constipation or moderate constipation (Hold if diarrhea). 120 tablet 0   sildenafil (REVATIO) 20 MG tablet TAKE 1 TO 5 TABLETS BY MOUTH ABOUT 30 MINUTES PRIOR TO SEX.START WITH 1 THEN INCREASE 30 tablet 5   STIOLTO RESPIMAT 2.5-2.5 MCG/ACT AERS INHALE 2 PUFFS INTO THE LUNGS DAILY. 12 g 3   sucralfate (CARAFATE) 1 g tablet Take 1 tablet (1 g total) by mouth 3 (three) times daily before meals. 90 tablet 1   aspirin EC 325 MG EC tablet Take 1 tablet (325 mg total) by mouth daily. (Patient not taking: Reported on 04/05/2023) 90 tablet 0   No current facility-administered medications for this  visit.   Facility-Administered Medications Ordered in Other Visits  Medication Dose Route Frequency Provider Last Rate Last Admin   0.9 %  sodium chloride infusion   Intravenous Once Orlie Dakin, Tollie Pizza, MD       CARBOplatin (PARAPLATIN) 180 mg in sodium chloride 0.9 % 100 mL chemo infusion  180 mg Intravenous Once Jeralyn Ruths, MD       dexamethasone (DECADRON) 10 mg in sodium chloride 0.9 % 50 mL IVPB  10 mg Intravenous Once Jeralyn Ruths, MD       diphenhydrAMINE (BENADRYL) injection 25 mg  25 mg Intravenous Once Jeralyn Ruths, MD       famotidine (PEPCID) IVPB 20 mg premix  20 mg Intravenous Once Jeralyn Ruths, MD       heparin lock flush 100 unit/mL  500 Units Intracatheter Once PRN Jeralyn Ruths, MD       iron sucrose (VENOFER) 200 mg in sodium chloride 0.9 % 100 mL IVPB  200 mg Intravenous Once Jeralyn Ruths, MD       PACLitaxel (TAXOL) 84 mg in sodium chloride 0.9 % 250 mL chemo infusion (</= 80mg /m2)  45 mg/m2 (Treatment Plan Recorded) Intravenous Once Jeralyn Ruths, MD       palonosetron (ALOXI) injection 0.25 mg  0.25 mg Intravenous Once Jeralyn Ruths, MD        OBJECTIVE: Vitals:   05/17/23 0912  BP: 116/86  Pulse: 67  Resp: 16  Temp: (!) 97.5 F (36.4 C)  SpO2: 100%       Body mass index is 19.39 kg/m.    ECOG FS:1 - Symptomatic but completely ambulatory  General: Well-developed, well-nourished, no acute distress. Eyes: Pink conjunctiva, anicteric sclera. HEENT: Normocephalic, moist mucous membranes. Lungs: No audible wheezing or coughing. Heart: Regular rate and rhythm. Abdomen: Soft, nontender, no obvious distention. Musculoskeletal: No edema, cyanosis, or clubbing. Neuro: Alert, answering all questions appropriately. Cranial nerves grossly intact. Skin: No rashes or petechiae noted. Psych: Normal affect.  LAB RESULTS:  Lab Results  Component Value Date   NA 135 05/17/2023   K 3.9 05/17/2023  CL 107  05/17/2023   CO2 23 05/17/2023   GLUCOSE 104 (H) 05/17/2023   BUN 26 (H) 05/17/2023   CREATININE 0.90 05/17/2023   CALCIUM 10.1 05/17/2023   PROT 6.6 05/17/2023   ALBUMIN 3.5 05/17/2023   AST 31 05/17/2023   ALT 52 (H) 05/17/2023   ALKPHOS 75 05/17/2023   BILITOT 0.7 05/17/2023   GFRNONAA >60 05/17/2023   GFRAA 85 12/22/2020    Lab Results  Component Value Date   WBC 4.3 05/17/2023   NEUTROABS 3.1 05/17/2023   HGB 11.3 (L) 05/17/2023   HCT 35.2 (L) 05/17/2023   MCV 91.4 05/17/2023   PLT 159 05/17/2023     STUDIES: No results found.  ASSESSMENT: Stage IIIa adenocarcinoma of the right upper lobe lung.  PLAN:    Stage IIIa adenocarcinoma of the right upper lobe lung: Pathology and imaging reviewed independently confirming diagnosis and stage of disease.  PET scan results from February 14, 2023 reviewed independently with no obvious evidence of metastatic disease.  MRI of the brain from February 11, 2023 also negative for metastatic disease.  Surgical resection was not an option, therefore patient proceeded with concurrent XRT along with weekly carboplatin and Taxol. This will be followed by maintenance durvalumab every 2 weeks for 1 year.  Patient has had port placement.  Continue daily XRT completing treatment on May 27, 2023.  Proceed with cycle 7 of treatment today. Continue IV fluids and IV Venofer with each weekly treatment.  Return to clinic in 1 week for further evaluation and his eighth and final treatment. Light sensitivity: Patient does not complain of this today.  Unrelated to treatments.  Patient states this has improved since buying new sunglasses.  Previously recommended follow-up with his eye doctor. Chest pain: Patient does not complain of this today.  Continue oxycodone as needed. Iron deficiency anemia: Hemoglobin continues to trend up and is now 11.3.  Proceed with IV Venofer today. Leukocytosis: Resolved.   Weight loss: Improving.  Follow-up with dietary as  indicated. Weakness and fatigue: Patient does not complain of this today.    Patient expressed understanding and was in agreement with this plan. He also understands that He can call clinic at any time with any questions, concerns, or complaints.    Cancer Staging  Adenocarcinoma of upper lobe of right lung [facility]) Staging form: Lung, AJCC 8th Edition - Clinical stage from 03/15/2023: Stage IIIA (cT4, cN0, cM0) - Signed by Jeralyn Ruths, MD on 03/15/2023 Stage prefix: Initial diagnosis   Jeralyn Ruths, MD   05/17/2023 9:55 AM

## 2023-05-17 NOTE — Patient Instructions (Signed)
[place] [facility] REG[facility]  Discharge Instructions: Thank you for choosing [facility] to provide your oncology and hematology care.  If you have a lab appointment with the Cancer Center, please go directly to the Cancer Center and check in at the registration area.  Wear comfortable clothing and clothing appropriate for easy access to any Portacath or PICC line.   We strive to give you quality time with your provider. You may need to reschedule your appointment if you arrive late (15 or more minutes).  Arriving late affects you and other patients whose appointments are after yours.  Also, if you miss three or more appointments without notifying the office, you may be dismissed from the clinic at the provider's discretion.      For prescription refill requests, have your pharmacy contact our office and allow 72 hours for refills to be completed.    Today you received the following chemotherapy and/or immunotherapy agents Taxol & Carboplatin    To help prevent nausea and vomiting after your treatment, we encourage you to take your nausea medication as directed.  BELOW ARE SYMPTOMS THAT SHOULD BE REPORTED IMMEDIATELY: *FEVER GREATER THAN 100.4 F (38 C) OR HIGHER *CHILLS OR SWEATING *NAUSEA AND VOMITING THAT IS NOT CONTROLLED WITH YOUR NAUSEA MEDICATION *UNUSUAL SHORTNESS OF BREATH *UNUSUAL BRUISING OR BLEEDING *URINARY PROBLEMS (pain or burning when urinating, or frequent urination) *BOWEL PROBLEMS (unusual diarrhea, constipation, pain near the anus) TENDERNESS IN MOUTH AND THROAT WITH OR WITHOUT PRESENCE OF ULCERS (sore throat, sores in mouth, or a toothache) UNUSUAL RASH, SWELLING OR PAIN  UNUSUAL VAGINAL DISCHARGE OR ITCHING   Items with * indicate a potential emergency and should be followed up as soon as possible or go to the Emergency Department if any problems should occur.  Please show the CHEMOTHERAPY ALERT CARD or IMMUNOTHERAPY ALERT CARD at  check-in to the Emergency Department and triage nurse.  Should you have questions after your visit or need to cancel or reschedule your appointment, please contact [place] [facility]  [phone] and follow the prompts.  Office hours are 8:00 a.m. to 4:30 p.m. Monday - Friday. Please note that voicemails left after 4:00 p.m. may not be returned until the following business day.  We are closed weekends and major holidays. You have access to a nurse at all times for urgent questions. Please call the main number to the clinic [phone] and follow the prompts.  For any non-urgent questions, you may also contact your provider using MyChart. We now offer e-Visits for anyone 66 and older to request care online for non-urgent symptoms. For details visit [email or web address].   Also download the MyChart app! Go to the app store, search "MyChart", open the app, select [place], and log in with your MyChart username and password.

## 2023-05-17 NOTE — Progress Notes (Signed)
Nutrition Follow-up:  Patient with lung cancer.  Patient receiving concurrent chemotherapy and radiation  Met with patient during infusion.  Reports that his appetite is better.  Wife is now prepared meals.  Eating 3 meals a day and drinking 350 calorie ensure shakes (3-4 a day).  Taking carafate and it is helping with reflux like symptoms.    Medications: reviewed  Labs: reviewed  Anthropometrics:   Weight is 143 lb today  140 lb 9.6 oz on 8/27 141 lb on 8/6 152 lb on 6/1   NUTRITION DIAGNOSIS: Inadequate oral intake improving   INTERVENTION:  Continue high calorie, high protein foods Continue oral nutrition supplements for added nutrition    MONITORING, EVALUATION, GOAL: weight trends, intake   NEXT VISIT: as needed  Marvel Sapp B. Freida Busman, RD, LDN Registered Dietitian [phone]

## 2023-05-18 ENCOUNTER — Other Ambulatory Visit: Payer: Self-pay

## 2023-05-18 ENCOUNTER — Ambulatory Visit
Admission: RE | Admit: 2023-05-18 | Discharge: 2023-05-18 | Disposition: A | Discharge status: Home / Self Care | Payer: Medicare HMO | Source: Ambulatory Visit | Attending: Radiation Oncology | Admitting: Radiation Oncology

## 2023-05-18 DIAGNOSIS — R079 Chest pain, unspecified: Secondary | ICD-10-CM | POA: Diagnosis not present

## 2023-05-18 DIAGNOSIS — R42 Dizziness and giddiness: Secondary | ICD-10-CM | POA: Diagnosis not present

## 2023-05-18 DIAGNOSIS — J449 Chronic obstructive pulmonary disease, unspecified: Secondary | ICD-10-CM | POA: Diagnosis not present

## 2023-05-18 DIAGNOSIS — Z87891 Personal history of nicotine dependence: Secondary | ICD-10-CM | POA: Diagnosis not present

## 2023-05-18 DIAGNOSIS — C3411 Malignant neoplasm of upper lobe, right bronchus or lung: Secondary | ICD-10-CM | POA: Diagnosis not present

## 2023-05-18 DIAGNOSIS — Z5111 Encounter for antineoplastic chemotherapy: Secondary | ICD-10-CM | POA: Diagnosis not present

## 2023-05-18 DIAGNOSIS — Z8673 Personal history of transient ischemic attack (TIA), and cerebral infarction without residual deficits: Secondary | ICD-10-CM | POA: Diagnosis not present

## 2023-05-18 DIAGNOSIS — R531 Weakness: Secondary | ICD-10-CM | POA: Diagnosis not present

## 2023-05-18 DIAGNOSIS — R634 Abnormal weight loss: Secondary | ICD-10-CM | POA: Diagnosis not present

## 2023-05-18 DIAGNOSIS — Z510 Encounter for antineoplastic radiation therapy: Secondary | ICD-10-CM | POA: Diagnosis not present

## 2023-05-18 LAB — RAD ONC ARIA SESSION SUMMARY
Course Elapsed Days: 42
Plan Fractions Treated to Date: 28
Plan Prescribed Dose Per Fraction: 2 Gy
Plan Total Fractions Prescribed: 35
Plan Total Prescribed Dose: 70 Gy
Reference Point Dosage Given to Date: 56 Gy
Reference Point Session Dosage Given: 2 Gy
Session Number: 28

## 2023-05-19 ENCOUNTER — Ambulatory Visit
Admission: RE | Admit: 2023-05-19 | Discharge: 2023-05-19 | Disposition: A | Discharge status: Home / Self Care | Payer: Medicare HMO | Source: Ambulatory Visit | Attending: Radiation Oncology | Admitting: Radiation Oncology

## 2023-05-19 ENCOUNTER — Other Ambulatory Visit: Payer: Self-pay

## 2023-05-19 DIAGNOSIS — R531 Weakness: Secondary | ICD-10-CM | POA: Diagnosis not present

## 2023-05-19 DIAGNOSIS — Z510 Encounter for antineoplastic radiation therapy: Secondary | ICD-10-CM | POA: Diagnosis not present

## 2023-05-19 DIAGNOSIS — C3411 Malignant neoplasm of upper lobe, right bronchus or lung: Secondary | ICD-10-CM | POA: Diagnosis not present

## 2023-05-19 DIAGNOSIS — Z5111 Encounter for antineoplastic chemotherapy: Secondary | ICD-10-CM | POA: Diagnosis not present

## 2023-05-19 DIAGNOSIS — R079 Chest pain, unspecified: Secondary | ICD-10-CM | POA: Diagnosis not present

## 2023-05-19 DIAGNOSIS — Z87891 Personal history of nicotine dependence: Secondary | ICD-10-CM | POA: Diagnosis not present

## 2023-05-19 DIAGNOSIS — J449 Chronic obstructive pulmonary disease, unspecified: Secondary | ICD-10-CM | POA: Diagnosis not present

## 2023-05-19 DIAGNOSIS — R42 Dizziness and giddiness: Secondary | ICD-10-CM | POA: Diagnosis not present

## 2023-05-19 DIAGNOSIS — R634 Abnormal weight loss: Secondary | ICD-10-CM | POA: Diagnosis not present

## 2023-05-19 DIAGNOSIS — Z8673 Personal history of transient ischemic attack (TIA), and cerebral infarction without residual deficits: Secondary | ICD-10-CM | POA: Diagnosis not present

## 2023-05-19 LAB — RAD ONC ARIA SESSION SUMMARY
Course Elapsed Days: 43
Plan Fractions Treated to Date: 29
Plan Prescribed Dose Per Fraction: 2 Gy
Plan Total Fractions Prescribed: 35
Plan Total Prescribed Dose: 70 Gy
Reference Point Dosage Given to Date: 58 Gy
Reference Point Session Dosage Given: 2 Gy
Session Number: 29

## 2023-05-20 ENCOUNTER — Ambulatory Visit
Admission: RE | Admit: 2023-05-20 | Discharge: 2023-05-20 | Disposition: A | Discharge status: Home / Self Care | Payer: Medicare HMO | Source: Ambulatory Visit | Attending: Radiation Oncology | Admitting: Radiation Oncology

## 2023-05-20 ENCOUNTER — Other Ambulatory Visit: Payer: Self-pay

## 2023-05-20 DIAGNOSIS — C3411 Malignant neoplasm of upper lobe, right bronchus or lung: Secondary | ICD-10-CM | POA: Diagnosis not present

## 2023-05-20 DIAGNOSIS — R079 Chest pain, unspecified: Secondary | ICD-10-CM | POA: Diagnosis not present

## 2023-05-20 DIAGNOSIS — R42 Dizziness and giddiness: Secondary | ICD-10-CM | POA: Diagnosis not present

## 2023-05-20 DIAGNOSIS — Z5111 Encounter for antineoplastic chemotherapy: Secondary | ICD-10-CM | POA: Diagnosis not present

## 2023-05-20 DIAGNOSIS — Z87891 Personal history of nicotine dependence: Secondary | ICD-10-CM | POA: Diagnosis not present

## 2023-05-20 DIAGNOSIS — Z510 Encounter for antineoplastic radiation therapy: Secondary | ICD-10-CM | POA: Diagnosis not present

## 2023-05-20 DIAGNOSIS — Z8673 Personal history of transient ischemic attack (TIA), and cerebral infarction without residual deficits: Secondary | ICD-10-CM | POA: Diagnosis not present

## 2023-05-20 DIAGNOSIS — R634 Abnormal weight loss: Secondary | ICD-10-CM | POA: Diagnosis not present

## 2023-05-20 DIAGNOSIS — R531 Weakness: Secondary | ICD-10-CM | POA: Diagnosis not present

## 2023-05-20 DIAGNOSIS — J449 Chronic obstructive pulmonary disease, unspecified: Secondary | ICD-10-CM | POA: Diagnosis not present

## 2023-05-20 LAB — RAD ONC ARIA SESSION SUMMARY
Course Elapsed Days: 44
Plan Fractions Treated to Date: 30
Plan Prescribed Dose Per Fraction: 2 Gy
Plan Total Fractions Prescribed: 35
Plan Total Prescribed Dose: 70 Gy
Reference Point Dosage Given to Date: 60 Gy
Reference Point Session Dosage Given: 2 Gy
Session Number: 30

## 2023-05-23 ENCOUNTER — Ambulatory Visit
Admission: RE | Admit: 2023-05-23 | Discharge: 2023-05-23 | Disposition: A | Discharge status: Home / Self Care | Payer: Medicare HMO | Source: Ambulatory Visit | Attending: Radiation Oncology | Admitting: Radiation Oncology

## 2023-05-23 ENCOUNTER — Other Ambulatory Visit: Payer: Self-pay

## 2023-05-23 DIAGNOSIS — Z8673 Personal history of transient ischemic attack (TIA), and cerebral infarction without residual deficits: Secondary | ICD-10-CM | POA: Diagnosis not present

## 2023-05-23 DIAGNOSIS — R079 Chest pain, unspecified: Secondary | ICD-10-CM | POA: Diagnosis not present

## 2023-05-23 DIAGNOSIS — R634 Abnormal weight loss: Secondary | ICD-10-CM | POA: Diagnosis not present

## 2023-05-23 DIAGNOSIS — C3411 Malignant neoplasm of upper lobe, right bronchus or lung: Secondary | ICD-10-CM | POA: Diagnosis not present

## 2023-05-23 DIAGNOSIS — Z5111 Encounter for antineoplastic chemotherapy: Secondary | ICD-10-CM | POA: Diagnosis not present

## 2023-05-23 DIAGNOSIS — J449 Chronic obstructive pulmonary disease, unspecified: Secondary | ICD-10-CM | POA: Diagnosis not present

## 2023-05-23 DIAGNOSIS — R531 Weakness: Secondary | ICD-10-CM | POA: Diagnosis not present

## 2023-05-23 DIAGNOSIS — Z87891 Personal history of nicotine dependence: Secondary | ICD-10-CM | POA: Diagnosis not present

## 2023-05-23 DIAGNOSIS — Z510 Encounter for antineoplastic radiation therapy: Secondary | ICD-10-CM | POA: Diagnosis not present

## 2023-05-23 DIAGNOSIS — R42 Dizziness and giddiness: Secondary | ICD-10-CM | POA: Diagnosis not present

## 2023-05-23 LAB — RAD ONC ARIA SESSION SUMMARY
Course Elapsed Days: 47
Plan Fractions Treated to Date: 31
Plan Prescribed Dose Per Fraction: 2 Gy
Plan Total Fractions Prescribed: 35
Plan Total Prescribed Dose: 70 Gy
Reference Point Dosage Given to Date: 62 Gy
Reference Point Session Dosage Given: 2 Gy
Session Number: 31

## 2023-05-24 ENCOUNTER — Other Ambulatory Visit: Payer: Self-pay

## 2023-05-24 ENCOUNTER — Ambulatory Visit
Admission: RE | Admit: 2023-05-24 | Discharge: 2023-05-24 | Disposition: A | Discharge status: Home / Self Care | Payer: Medicare HMO | Source: Ambulatory Visit | Attending: Radiation Oncology | Admitting: Radiation Oncology

## 2023-05-24 DIAGNOSIS — R42 Dizziness and giddiness: Secondary | ICD-10-CM | POA: Diagnosis not present

## 2023-05-24 DIAGNOSIS — Z5111 Encounter for antineoplastic chemotherapy: Secondary | ICD-10-CM | POA: Diagnosis not present

## 2023-05-24 DIAGNOSIS — C3411 Malignant neoplasm of upper lobe, right bronchus or lung: Secondary | ICD-10-CM | POA: Diagnosis not present

## 2023-05-24 DIAGNOSIS — Z8673 Personal history of transient ischemic attack (TIA), and cerebral infarction without residual deficits: Secondary | ICD-10-CM | POA: Diagnosis not present

## 2023-05-24 DIAGNOSIS — Z510 Encounter for antineoplastic radiation therapy: Secondary | ICD-10-CM | POA: Diagnosis not present

## 2023-05-24 DIAGNOSIS — R531 Weakness: Secondary | ICD-10-CM | POA: Diagnosis not present

## 2023-05-24 DIAGNOSIS — R634 Abnormal weight loss: Secondary | ICD-10-CM | POA: Diagnosis not present

## 2023-05-24 DIAGNOSIS — R079 Chest pain, unspecified: Secondary | ICD-10-CM | POA: Diagnosis not present

## 2023-05-24 DIAGNOSIS — J449 Chronic obstructive pulmonary disease, unspecified: Secondary | ICD-10-CM | POA: Diagnosis not present

## 2023-05-24 DIAGNOSIS — Z87891 Personal history of nicotine dependence: Secondary | ICD-10-CM | POA: Diagnosis not present

## 2023-05-24 LAB — RAD ONC ARIA SESSION SUMMARY
Course Elapsed Days: 48
Plan Fractions Treated to Date: 32
Plan Prescribed Dose Per Fraction: 2 Gy
Plan Total Fractions Prescribed: 35
Plan Total Prescribed Dose: 70 Gy
Reference Point Dosage Given to Date: 64 Gy
Reference Point Session Dosage Given: 2 Gy
Session Number: 32

## 2023-05-24 MED FILL — Dexamethasone Sodium Phosphate Inj 100 MG/10ML: INTRAMUSCULAR | Qty: 1 | Status: AC

## 2023-05-25 ENCOUNTER — Ambulatory Visit
Admission: RE | Admit: 2023-05-25 | Discharge: 2023-05-25 | Disposition: A | Discharge status: Home / Self Care | Payer: Medicare HMO | Source: Ambulatory Visit | Attending: Radiation Oncology | Admitting: Radiation Oncology

## 2023-05-25 ENCOUNTER — Ambulatory Visit: Payer: Medicare HMO

## 2023-05-25 ENCOUNTER — Other Ambulatory Visit: Payer: Self-pay

## 2023-05-25 ENCOUNTER — Encounter: Payer: Self-pay | Admitting: Oncology

## 2023-05-25 ENCOUNTER — Inpatient Hospital Stay: Payer: Medicare HMO

## 2023-05-25 ENCOUNTER — Inpatient Hospital Stay: Payer: Medicare HMO | Admitting: Oncology

## 2023-05-25 VITALS — BP 130/91 | HR 80 | Temp 96.5°F | Resp 16 | Ht 72.0 in | Wt 142.0 lb

## 2023-05-25 DIAGNOSIS — R634 Abnormal weight loss: Secondary | ICD-10-CM | POA: Diagnosis not present

## 2023-05-25 DIAGNOSIS — C3411 Malignant neoplasm of upper lobe, right bronchus or lung: Secondary | ICD-10-CM

## 2023-05-25 DIAGNOSIS — Z5111 Encounter for antineoplastic chemotherapy: Secondary | ICD-10-CM | POA: Diagnosis not present

## 2023-05-25 DIAGNOSIS — R42 Dizziness and giddiness: Secondary | ICD-10-CM | POA: Diagnosis not present

## 2023-05-25 DIAGNOSIS — Z8673 Personal history of transient ischemic attack (TIA), and cerebral infarction without residual deficits: Secondary | ICD-10-CM | POA: Diagnosis not present

## 2023-05-25 DIAGNOSIS — Z510 Encounter for antineoplastic radiation therapy: Secondary | ICD-10-CM | POA: Diagnosis not present

## 2023-05-25 DIAGNOSIS — J449 Chronic obstructive pulmonary disease, unspecified: Secondary | ICD-10-CM | POA: Diagnosis not present

## 2023-05-25 DIAGNOSIS — R531 Weakness: Secondary | ICD-10-CM | POA: Diagnosis not present

## 2023-05-25 DIAGNOSIS — R079 Chest pain, unspecified: Secondary | ICD-10-CM | POA: Diagnosis not present

## 2023-05-25 DIAGNOSIS — Z87891 Personal history of nicotine dependence: Secondary | ICD-10-CM | POA: Diagnosis not present

## 2023-05-25 LAB — CBC WITH DIFFERENTIAL (CANCER CENTER ONLY)
Abs Immature Granulocytes: 0.12 10*3/uL — ABNORMAL HIGH (ref 0.00–0.07)
Basophils Absolute: 0 10*3/uL (ref 0.0–0.1)
Basophils Relative: 1 %
Eosinophils Absolute: 0 10*3/uL (ref 0.0–0.5)
Eosinophils Relative: 0 %
HCT: 35.7 % — ABNORMAL LOW (ref 39.0–52.0)
Hemoglobin: 11.7 g/dL — ABNORMAL LOW (ref 13.0–17.0)
Immature Granulocytes: 4 %
Lymphocytes Relative: 13 %
Lymphs Abs: 0.4 10*3/uL — ABNORMAL LOW (ref 0.7–4.0)
MCH: 29.8 pg (ref 26.0–34.0)
MCHC: 32.8 g/dL (ref 30.0–36.0)
MCV: 90.8 fL (ref 80.0–100.0)
Monocytes Absolute: 0.2 10*3/uL (ref 0.1–1.0)
Monocytes Relative: 8 %
Neutro Abs: 2.1 10*3/uL (ref 1.7–7.7)
Neutrophils Relative %: 74 %
Platelet Count: 164 10*3/uL (ref 150–400)
RBC: 3.93 MIL/uL — ABNORMAL LOW (ref 4.22–5.81)
RDW: 23.6 % — ABNORMAL HIGH (ref 11.5–15.5)
WBC Count: 2.9 10*3/uL — ABNORMAL LOW (ref 4.0–10.5)
nRBC: 0 % (ref 0.0–0.2)

## 2023-05-25 LAB — CMP (CANCER CENTER ONLY)
ALT: 26 U/L (ref 0–44)
AST: 24 U/L (ref 15–41)
Albumin: 3.4 g/dL — ABNORMAL LOW (ref 3.5–5.0)
Alkaline Phosphatase: 66 U/L (ref 38–126)
Anion gap: 5 (ref 5–15)
BUN: 25 mg/dL — ABNORMAL HIGH (ref 8–23)
CO2: 21 mmol/L — ABNORMAL LOW (ref 22–32)
Calcium: 10.3 mg/dL (ref 8.9–10.3)
Chloride: 107 mmol/L (ref 98–111)
Creatinine: 0.9 mg/dL (ref 0.61–1.24)
GFR, Estimated: >60 mL/min (ref >60)
Glucose, Bld: 111 mg/dL — ABNORMAL HIGH (ref 70–99)
Potassium: 3.7 mmol/L (ref 3.5–5.1)
Sodium: 133 mmol/L — ABNORMAL LOW (ref 135–145)
Total Bilirubin: 0.7 mg/dL (ref 0.3–1.2)
Total Protein: 6.7 g/dL (ref 6.5–8.1)

## 2023-05-25 LAB — RAD ONC ARIA SESSION SUMMARY
Course Elapsed Days: 49
Plan Fractions Treated to Date: 33
Plan Prescribed Dose Per Fraction: 2 Gy
Plan Total Fractions Prescribed: 35
Plan Total Prescribed Dose: 70 Gy
Reference Point Dosage Given to Date: 66 Gy
Reference Point Session Dosage Given: 2 Gy
Session Number: 33

## 2023-05-25 MED ORDER — SODIUM CHLORIDE 0.9 % IV SOLN
45.0000 mg/m2 | Freq: Once | INTRAVENOUS | Status: AC
  Administered 2023-05-25: 84 mg via INTRAVENOUS
  Filled 2023-05-25: qty 14

## 2023-05-25 MED ORDER — SODIUM CHLORIDE 0.9 % IV SOLN
Freq: Once | INTRAVENOUS | Status: AC
  Filled 2023-05-25: qty 250

## 2023-05-25 MED ORDER — FAMOTIDINE IN NACL 20-0.9 MG/50ML-% IV SOLN
20.0000 mg | Freq: Once | INTRAVENOUS | Status: AC
  Administered 2023-05-25: 20 mg via INTRAVENOUS
  Filled 2023-05-25: qty 50

## 2023-05-25 MED ORDER — SODIUM CHLORIDE 0.9 % IV SOLN
10.0000 mg | Freq: Once | INTRAVENOUS | Status: AC
  Administered 2023-05-25: 10 mg via INTRAVENOUS
  Filled 2023-05-25: qty 10

## 2023-05-25 MED ORDER — DIPHENHYDRAMINE HCL 50 MG/ML IJ SOLN
25.0000 mg | Freq: Once | INTRAMUSCULAR | Status: AC
  Administered 2023-05-25: 25 mg via INTRAVENOUS
  Filled 2023-05-25: qty 1

## 2023-05-25 MED ORDER — SODIUM CHLORIDE 0.9% FLUSH
10.0000 mL | Freq: Once | INTRAVENOUS | Status: AC
  Administered 2023-05-25: 10 mL via INTRAVENOUS
  Filled 2023-05-25: qty 10

## 2023-05-25 MED ORDER — PALONOSETRON HCL INJECTION 0.25 MG/5ML
0.2500 mg | Freq: Once | INTRAVENOUS | Status: AC
  Administered 2023-05-25: 0.25 mg via INTRAVENOUS
  Filled 2023-05-25: qty 5

## 2023-05-25 MED ORDER — SODIUM CHLORIDE 0.9 % IV SOLN
177.4000 mg | Freq: Once | INTRAVENOUS | Status: AC
  Administered 2023-05-25: 180 mg via INTRAVENOUS
  Filled 2023-05-25: qty 18

## 2023-05-25 MED ORDER — SODIUM CHLORIDE 0.9 % IV SOLN
200.0000 mg | Freq: Once | INTRAVENOUS | Status: AC
  Administered 2023-05-25: 200 mg via INTRAVENOUS
  Filled 2023-05-25: qty 200

## 2023-05-25 MED ORDER — HEPARIN SOD (PORK) LOCK FLUSH 100 UNIT/ML IV SOLN
500.0000 [IU] | Freq: Once | INTRAVENOUS | Status: DC | PRN
  Filled 2023-05-25: qty 5

## 2023-05-25 NOTE — Progress Notes (Signed)
Wants to know if he can start back on his aspirin 325mg 

## 2023-05-25 NOTE — Patient Instructions (Signed)
[place] [facility]  Discharge Instructions: Thank you for choosing [facility] to provide your oncology and hematology care.  If you have a lab appointment with the Cancer Center, please go directly to the Cancer Center and check in at the registration area.  Wear comfortable clothing and clothing appropriate for easy access to any Portacath or PICC line.   We strive to give you quality time with your provider. You may need to reschedule your appointment if you arrive late (15 or more minutes).  Arriving late affects you and other patients whose appointments are after yours.  Also, if you miss three or more appointments without notifying the office, you may be dismissed from the clinic at the provider's discretion.      For prescription refill requests, have your pharmacy contact our office and allow 72 hours for refills to be completed.    Today you received the following chemotherapy and/or immunotherapy agents Carboplatin & Taxol      To help prevent nausea and vomiting after your treatment, we encourage you to take your nausea medication as directed.  BELOW ARE SYMPTOMS THAT SHOULD BE REPORTED IMMEDIATELY: *FEVER GREATER THAN 100.4 F (38 C) OR HIGHER *CHILLS OR SWEATING *NAUSEA AND VOMITING THAT IS NOT CONTROLLED WITH YOUR NAUSEA MEDICATION *UNUSUAL SHORTNESS OF BREATH *UNUSUAL BRUISING OR BLEEDING *URINARY PROBLEMS (pain or burning when urinating, or frequent urination) *BOWEL PROBLEMS (unusual diarrhea, constipation, pain near the anus) TENDERNESS IN MOUTH AND THROAT WITH OR WITHOUT PRESENCE OF ULCERS (sore throat, sores in mouth, or a toothache) UNUSUAL RASH, SWELLING OR PAIN  UNUSUAL VAGINAL DISCHARGE OR ITCHING   Items with * indicate a potential emergency and should be followed up as soon as possible or go to the Emergency Department if any problems should occur.  Please show the CHEMOTHERAPY ALERT CARD or IMMUNOTHERAPY ALERT CARD at  check-in to the Emergency Department and triage nurse.  Should you have questions after your visit or need to cancel or reschedule your appointment, please contact [place] [facility]  [phone] and follow the prompts.  Office hours are 8:00 a.m. to 4:30 p.m. Monday - Friday. Please note that voicemails left after 4:00 p.m. may not be returned until the following business day.  We are closed weekends and major holidays. You have access to a nurse at all times for urgent questions. Please call the main number to the clinic [phone] and follow the prompts.  For any non-urgent questions, you may also contact your provider using MyChart. We now offer e-Visits for anyone 33 and older to request care online for non-urgent symptoms. For details visit [email or web address].   Also download the MyChart app! Go to the app store, search "MyChart", open the app, select [place], and log in with your MyChart username and password.

## 2023-05-25 NOTE — Progress Notes (Signed)
DISCONTINUE ON PATHWAY REGIMEN - Non-Small Cell Lung     A cycle is every 7 days, concurrent with RT:     Paclitaxel      Carboplatin   **Always confirm dose/schedule in your pharmacy ordering system**  REASON: Continuation Of Treatment PRIOR TREATMENT: [id number]: Carboplatin AUC=2 + Paclitaxel 45 mg/m2 Weekly During Radiation TREATMENT RESPONSE: Unable to Evaluate  START ON PATHWAY REGIMEN - Non-Small Cell Lung     A cycle is every 14 days:     Durvalumab   **Always confirm dose/schedule in your pharmacy ordering system**  Patient Characteristics: Preoperative or Nonsurgical Candidate (Clinical Staging), Stage III - Nonsurgical Candidate (Nonsquamous and Squamous), PS = 0, 1 Therapeutic Status: Preoperative or Nonsurgical Candidate (Clinical Staging) AJCC T Category: cT4 AJCC N Category: cN0 AJCC M Category: cM0 AJCC 8 Stage Grouping: IIIA ECOG Performance Status: 0 Intent of Therapy: Curative Intent, Discussed with Patient

## 2023-05-25 NOTE — Progress Notes (Signed)
[facility]  [phone] [phone]  ID: Ethan Fitzgerald OB: [birth year]  MR#: [id number]  QIO#:[id number]  Patient Care Team: Smitty Cords, DO as PCP - General (Family Medicine) Micki Riley, MD as Referring Physician (Neurology) Glory Buff, RN as Oncology Nurse Navigator Orlie Dakin, Tollie Pizza, MD as Consulting Physician (Oncology)  CHIEF COMPLAINT: Stage IIIa adenocarcinoma of the right upper lobe lung.  INTERVAL HISTORY: Patient returns to clinic today for further evaluation and consideration of his eighth and final treatment of weekly carboplatin and Taxol.  He continues to feel well and remains asymptomatic.  He continues to tolerate his treatment well without significant side effects.  He has a fair appetite.  He does not complain of pain today.  He has no new neurologic complaints.  He denies any recent fevers or illnesses.   He denies any chest pain, shortness of breath, cough, or hemoptysis.  He has no nausea, vomiting, constipation, or diarrhea.  He has no urinary complaints.  Patient offers no specific complaints today.  REVIEW OF SYSTEMS:   Review of Systems  Constitutional: Negative.  Negative for fever, malaise/fatigue and weight loss.  Respiratory: Negative.  Negative for cough, hemoptysis and shortness of breath.   Cardiovascular: Negative.  Negative for chest pain and leg swelling.  Gastrointestinal: Negative.  Negative for abdominal pain.  Genitourinary: Negative.  Negative for dysuria.  Musculoskeletal: Negative.  Negative for back pain.  Skin: Negative.  Negative for rash.  Neurological: Negative.  Negative for dizziness, focal weakness, weakness and headaches.  Psychiatric/Behavioral: Negative.  The patient is not nervous/anxious.   All other systems reviewed and are negative.   As per HPI. Otherwise, a complete review of systems is negative.  PAST MEDICAL HISTORY: Past Medical History:  Diagnosis Date   COPD  (chronic obstructive pulmonary disease) (HCC)    mild   COVID-19 09/2019   Depression    Erectile dysfunction    Headache    daily, stress headaches   Knee pain, left    Seasonal allergies    Stroke (HCC)    Tobacco dependence     PAST SURGICAL HISTORY: Past Surgical History:  Procedure Laterality Date   BACK SURGERY     metal plate in back   COLONOSCOPY     COLONOSCOPY WITH PROPOFOL N/A 04/26/2019   Procedure: COLONOSCOPY WITH PROPOFOL;  Surgeon: Wyline Mood, MD;  Location: [facility] ENDOSCOPY;  Service: Gastroenterology;  Laterality: N/A;   FINE NEEDLE ASPIRATION  03/03/2023   Procedure: FINE NEEDLE ASPIRATION;  Surgeon: Raechel Chute, MD;  Location: [facility] ENDOSCOPY;  Service: Pulmonary;;   HERNIA REPAIR Right    IR ANGIO INTRA EXTRACRAN SEL COM CAROTID INNOMINATE UNI L MOD SED  03/27/2020   IR ANGIO VERTEBRAL SEL SUBCLAVIAN INNOMINATE UNI L MOD SED  03/27/2020   IR ANGIO VERTEBRAL SEL VERTEBRAL UNI R MOD SED  03/27/2020   IR CT HEAD LTD  03/27/2020   IR IMAGING GUIDED PORT INSERTION  03/29/2023   IR PERCUTANEOUS ART THROMBECTOMY/INFUSION INTRACRANIAL INC DIAG ANGIO  03/27/2020   KNEE ARTHROSCOPY Left 03/28/2015   Procedure: Arthroscopic partial medial meniscectomy plus chondral debridement;  Surgeon: Erin Sons, MD;  Location: [facility]GERY [facility];  Service: Orthopedics;  Laterality: Left;   RADIOLOGY WITH ANESTHESIA N/A 03/27/2020   Procedure: IR WITH ANESTHESIA;  Surgeon: Julieanne Cotton, MD;  Location: [facility] OR;  Service: Radiology;  Laterality: N/A;   VIDEO BRONCHOSCOPY WITH ENDOBRONCHIAL ULTRASOUND  03/03/2023   Procedure: VIDEO BRONCHOSCOPY WITH ENDOBRONCHIAL ULTRASOUND;  Surgeon: Raechel Chute, MD;  Location: [facility] ENDOSCOPY;  Service: Pulmonary;;    FAMILY HISTORY: Family History  Problem Relation Age of Onset   Diabetes Mother    Heart attack Mother 30   Cancer Father        liver    COPD Brother    HIV/AIDS Brother    Prostate cancer Neg Hx    Colon cancer Neg Hx      ADVANCED DIRECTIVES (Y/N):  N  HEALTH MAINTENANCE: Social History   Tobacco Use   Smoking status: Former    Current packs/day: 0.00    Average packs/day: 1.5 packs/day for 49.0 years (73.5 ttl pk-yrs)    Types: Cigarettes    Start date: 64    Quit date: 09/14/2017    Years since quitting: 5.6   Smokeless tobacco: Former  Building services engineer status: Never Used  Substance Use Topics   Alcohol use: Not Currently    Comment: 6 pack on weekend   Drug use: No     Colonoscopy:  PAP:  Bone density:  Lipid panel:  No Known Allergies  Current Outpatient Medications  Medication Sig Dispense Refill   albuterol (VENTOLIN HFA) 108 (90 Base) MCG/ACT inhaler Inhale 2 puffs into the lungs every 4 (four) hours as needed for wheezing or shortness of breath. 1 each 2   b complex vitamins capsule Take 1 capsule by mouth daily.     Cholecalciferol (VITAMIN D) 50 MCG (2000 UT) tablet Take 2,000 Units by mouth daily.     dexamethasone (DECADRON) 4 MG tablet Take 1 tablet (4 mg total) by mouth daily. 30 tablet 2   diphenhydrAMINE (BENADRYL) 50 MG tablet Take 50-100 mg by mouth at bedtime as needed for sleep.     fludrocortisone (FLORINEF) 0.1 MG tablet Take 0.1 mg by mouth daily.     fluticasone (FLONASE) 50 MCG/ACT nasal spray Place 2 sprays into both nostrils daily. (Patient taking differently: Place 2 sprays into both nostrils daily as needed for allergies.) 48 g 3   lidocaine-prilocaine (EMLA) cream Apply to affected area once 30 g 3   loratadine (CLARITIN) 10 MG tablet Take 1 tablet (10 mg total) by mouth daily. Use for 4-6 weeks then stop, and use as needed or seasonally (Patient taking differently: Take 10 mg by mouth daily as needed for allergies.) 30 tablet 11   melatonin 5 MG TABS Take 5 mg by mouth at bedtime as needed (sleep).     methocarbamol (ROBAXIN) 500 MG tablet Take 1 tablet (500 mg total) by mouth 4 (four) times daily. 20 tablet 0   midodrine (PROAMATINE) 5 MG tablet Take  10 mg by mouth 3 (three) times daily with meals.     Multiple Vitamin (MULTIVITAMIN WITH MINERALS) TABS tablet Take 1 tablet by mouth daily.     Multiple Vitamins-Minerals (IMMUNE SUPPORT VITAMIN C PO) Take 1 tablet by mouth daily.     naproxen (NAPROSYN) 500 MG tablet Take 1 tablet (500 mg total) by mouth 2 (two) times daily with a meal. 10 tablet 0   ondansetron (ZOFRAN) 8 MG tablet Take 1 tablet (8 mg total) by mouth every 8 (eight) hours as needed for nausea or vomiting. Start on the third day after chemotherapy. 60 tablet 2   OVER THE COUNTER MEDICATION Take 5 mLs by mouth every other day. [employer] vegan liquid iron     Oxycodone HCl 10 MG TABS Take 1 tablet (10 mg total) by mouth every 6 (  six) hours as needed. Take with stool softeners to avoid constipation 120 tablet 0   polyethylene glycol (MIRALAX) 17 g packet Take 17 g by mouth 2 (two) times daily as needed for mild constipation or moderate constipation (Hold if diarrhea.). 100 each 2   prochlorperazine (COMPAZINE) 10 MG tablet Take 1 tablet (10 mg total) by mouth every 6 (six) hours as needed for nausea or vomiting. 60 tablet 2   rosuvastatin (CRESTOR) 40 MG tablet Take 1 tablet (40 mg total) by mouth every other day. 45 tablet 3   senna (SENOKOT) 8.6 MG TABS tablet Take 1-2 tablets (8.6-17.2 mg total) by mouth 2 (two) times daily as needed for mild constipation or moderate constipation (Hold if diarrhea). 120 tablet 0   sildenafil (REVATIO) 20 MG tablet TAKE 1 TO 5 TABLETS BY MOUTH ABOUT 30 MINUTES PRIOR TO SEX.START WITH 1 THEN INCREASE 30 tablet 5   STIOLTO RESPIMAT 2.5-2.5 MCG/ACT AERS INHALE 2 PUFFS INTO THE LUNGS DAILY. 12 g 3   sucralfate (CARAFATE) 1 g tablet Take 1 tablet (1 g total) by mouth 3 (three) times daily before meals. 90 tablet 1   aspirin EC 325 MG EC tablet Take 1 tablet (325 mg total) by mouth daily. (Patient not taking: Reported on 05/25/2023) 90 tablet 0   No current facility-administered medications for this  visit.   Facility-Administered Medications Ordered in Other Visits  Medication Dose Route Frequency Provider Last Rate Last Admin   heparin lock flush 100 unit/mL  500 Units Intracatheter Once PRN Jeralyn Ruths, MD        OBJECTIVE: Vitals:   05/25/23 0909  BP: (!) 130/91  Pulse: 80  Resp: 16  Temp: (!) 96.5 F (35.8 C)  SpO2: 100%        Body mass index is 19.26 kg/m.    ECOG FS:1 - Symptomatic but completely ambulatory  General: Well-developed, well-nourished, no acute distress. Eyes: Pink conjunctiva, anicteric sclera. HEENT: Normocephalic, moist mucous membranes. Lungs: No audible wheezing or coughing. Heart: Regular rate and rhythm. Abdomen: Soft, nontender, no obvious distention. Musculoskeletal: No edema, cyanosis, or clubbing. Neuro: Alert, answering all questions appropriately. Cranial nerves grossly intact. Skin: No rashes or petechiae noted. Psych: Normal affect.   LAB RESULTS:  Lab Results  Component Value Date   NA 133 (L) 05/25/2023   K 3.7 05/25/2023   CL 107 05/25/2023   CO2 21 (L) 05/25/2023   GLUCOSE 111 (H) 05/25/2023   BUN 25 (H) 05/25/2023   CREATININE 0.90 05/25/2023   CALCIUM 10.3 05/25/2023   PROT 6.7 05/25/2023   ALBUMIN 3.4 (L) 05/25/2023   AST 24 05/25/2023   ALT 26 05/25/2023   ALKPHOS 66 05/25/2023   BILITOT 0.7 05/25/2023   GFRNONAA >60 05/25/2023   GFRAA 85 12/22/2020    Lab Results  Component Value Date   WBC 2.9 (L) 05/25/2023   NEUTROABS 2.1 05/25/2023   HGB 11.7 (L) 05/25/2023   HCT 35.7 (L) 05/25/2023   MCV 90.8 05/25/2023   PLT 164 05/25/2023     STUDIES: No results found.  ASSESSMENT: Stage IIIa adenocarcinoma of the right upper lobe lung.  PLAN:    Stage IIIa adenocarcinoma of the right upper lobe lung: Pathology and imaging reviewed independently confirming diagnosis and stage of disease.  PET scan results from February 14, 2023 reviewed independently with no obvious evidence of metastatic disease.   MRI of the brain from February 11, 2023 also negative for metastatic disease.  Surgical resection was  not an option, therefore patient proceeded with concurrent XRT along with weekly carboplatin and Taxol. This will be followed by maintenance durvalumab every 2 weeks for 1 year.  Patient has had port placement.  Continue daily XRT completing treatment on May 27, 2023.  Proceed with cycle 8 of treatment today.  Patient will also receive IV fluids and IV Venofer today.  Return to clinic in 2 weeks for further evaluation and consideration of cycle 1 of maintenance durvalumab.  Plan to reimage approximately 2 to 3 months after the conclusion of XRT.   Chest pain: Patient does not complain of this today.  Continue oxycodone as needed. Iron deficiency anemia: Hemoglobin continues to trend up and is now 11.7.  Proceed with IV Venofer today. Leukopenia: Mild, monitor.  Proceed with treatment as above.   Weight loss: Improving.  Follow-up with dietary as indicated. Hyponatremia: Mild, monitor.  Patient expressed understanding and was in agreement with this plan. He also understands that He can call clinic at any time with any questions, concerns, or complaints.    Cancer Staging  Adenocarcinoma of upper lobe of right lung [facility]) Staging form: Lung, AJCC 8th Edition - Clinical stage from 03/15/2023: Stage IIIA (cT4, cN0, cM0) - Signed by Jeralyn Ruths, MD on 03/15/2023 Stage prefix: Initial diagnosis   Jeralyn Ruths, MD   05/25/2023 2:11 PM

## 2023-05-26 ENCOUNTER — Other Ambulatory Visit: Payer: Self-pay

## 2023-05-26 ENCOUNTER — Ambulatory Visit
Admission: RE | Admit: 2023-05-26 | Discharge: 2023-05-26 | Disposition: A | Discharge status: Home / Self Care | Payer: Medicare HMO | Source: Ambulatory Visit | Attending: Radiation Oncology | Admitting: Radiation Oncology

## 2023-05-26 ENCOUNTER — Other Ambulatory Visit: Payer: Self-pay | Admitting: Radiation Oncology

## 2023-05-26 ENCOUNTER — Ambulatory Visit: Payer: Medicare HMO

## 2023-05-26 DIAGNOSIS — R634 Abnormal weight loss: Secondary | ICD-10-CM | POA: Diagnosis not present

## 2023-05-26 DIAGNOSIS — R079 Chest pain, unspecified: Secondary | ICD-10-CM | POA: Diagnosis not present

## 2023-05-26 DIAGNOSIS — C3411 Malignant neoplasm of upper lobe, right bronchus or lung: Secondary | ICD-10-CM | POA: Diagnosis not present

## 2023-05-26 DIAGNOSIS — Z87891 Personal history of nicotine dependence: Secondary | ICD-10-CM | POA: Diagnosis not present

## 2023-05-26 DIAGNOSIS — Z8673 Personal history of transient ischemic attack (TIA), and cerebral infarction without residual deficits: Secondary | ICD-10-CM | POA: Diagnosis not present

## 2023-05-26 DIAGNOSIS — R42 Dizziness and giddiness: Secondary | ICD-10-CM | POA: Diagnosis not present

## 2023-05-26 DIAGNOSIS — Z5111 Encounter for antineoplastic chemotherapy: Secondary | ICD-10-CM | POA: Diagnosis not present

## 2023-05-26 DIAGNOSIS — Z510 Encounter for antineoplastic radiation therapy: Secondary | ICD-10-CM | POA: Diagnosis not present

## 2023-05-26 DIAGNOSIS — R531 Weakness: Secondary | ICD-10-CM | POA: Diagnosis not present

## 2023-05-26 DIAGNOSIS — J449 Chronic obstructive pulmonary disease, unspecified: Secondary | ICD-10-CM | POA: Diagnosis not present

## 2023-05-26 LAB — RAD ONC ARIA SESSION SUMMARY
Course Elapsed Days: 50
Plan Fractions Treated to Date: 34
Plan Prescribed Dose Per Fraction: 2 Gy
Plan Total Fractions Prescribed: 35
Plan Total Prescribed Dose: 70 Gy
Reference Point Dosage Given to Date: 68 Gy
Reference Point Session Dosage Given: 2 Gy
Session Number: 34

## 2023-05-27 ENCOUNTER — Ambulatory Visit
Admission: RE | Admit: 2023-05-27 | Discharge: 2023-05-27 | Disposition: A | Discharge status: Home / Self Care | Payer: Medicare HMO | Source: Ambulatory Visit | Attending: Radiation Oncology | Admitting: Radiation Oncology

## 2023-05-27 ENCOUNTER — Other Ambulatory Visit: Payer: Self-pay

## 2023-05-27 DIAGNOSIS — J449 Chronic obstructive pulmonary disease, unspecified: Secondary | ICD-10-CM | POA: Diagnosis not present

## 2023-05-27 DIAGNOSIS — Z510 Encounter for antineoplastic radiation therapy: Secondary | ICD-10-CM | POA: Diagnosis not present

## 2023-05-27 DIAGNOSIS — R079 Chest pain, unspecified: Secondary | ICD-10-CM | POA: Diagnosis not present

## 2023-05-27 DIAGNOSIS — Z5111 Encounter for antineoplastic chemotherapy: Secondary | ICD-10-CM | POA: Diagnosis not present

## 2023-05-27 DIAGNOSIS — R531 Weakness: Secondary | ICD-10-CM | POA: Diagnosis not present

## 2023-05-27 DIAGNOSIS — C3411 Malignant neoplasm of upper lobe, right bronchus or lung: Secondary | ICD-10-CM | POA: Diagnosis not present

## 2023-05-27 DIAGNOSIS — Z8673 Personal history of transient ischemic attack (TIA), and cerebral infarction without residual deficits: Secondary | ICD-10-CM | POA: Diagnosis not present

## 2023-05-27 DIAGNOSIS — R634 Abnormal weight loss: Secondary | ICD-10-CM | POA: Diagnosis not present

## 2023-05-27 DIAGNOSIS — R42 Dizziness and giddiness: Secondary | ICD-10-CM | POA: Diagnosis not present

## 2023-05-27 DIAGNOSIS — Z87891 Personal history of nicotine dependence: Secondary | ICD-10-CM | POA: Diagnosis not present

## 2023-05-27 LAB — RAD ONC ARIA SESSION SUMMARY
Course Elapsed Days: 51
Plan Fractions Treated to Date: 35
Plan Prescribed Dose Per Fraction: 2 Gy
Plan Total Fractions Prescribed: 35
Plan Total Prescribed Dose: 70 Gy
Reference Point Dosage Given to Date: 70 Gy
Reference Point Session Dosage Given: 2 Gy
Session Number: 35

## 2023-05-31 NOTE — Progress Notes (Signed)
Pharmacist Chemotherapy Monitoring - Initial Assessment    Anticipated start date: 06/08/23   The following has been reviewed per standard work regarding the patient's treatment regimen: The patient's diagnosis, treatment plan and drug doses, and organ/hematologic function Lab orders and baseline tests specific to treatment regimen  The treatment plan start date, drug sequencing, and pre-medications Prior authorization status  Patient's documented medication list, including drug-drug interaction screen and prescriptions for anti-emetics and supportive care specific to the treatment regimen The drug concentrations, fluid compatibility, administration routes, and timing of the medications to be used The patient's access for treatment and lifetime cumulative dose history, if applicable  The patient's medication allergies and previous infusion related reactions, if applicable   Changes made to treatment plan:  N/A  Follow up needed:  N/A  Ebony Hail, Pharm.D., CPP 05/31/2023@4 :43 PM

## 2023-06-02 ENCOUNTER — Encounter: Payer: Self-pay | Admitting: Oncology

## 2023-06-07 ENCOUNTER — Telehealth: Payer: Self-pay | Admitting: Oncology

## 2023-06-07 NOTE — Telephone Encounter (Signed)
Patient called to r/s 10/28 appointments to after 11/4- they have a vacation planned- please advise

## 2023-06-08 ENCOUNTER — Encounter: Payer: Self-pay | Admitting: Oncology

## 2023-06-08 ENCOUNTER — Inpatient Hospital Stay: Payer: Medicare HMO | Admitting: Oncology

## 2023-06-08 ENCOUNTER — Inpatient Hospital Stay: Payer: Medicare HMO

## 2023-06-08 ENCOUNTER — Inpatient Hospital Stay: Payer: Medicare HMO | Attending: Oncology

## 2023-06-08 DIAGNOSIS — Z8616 Personal history of COVID-19: Secondary | ICD-10-CM | POA: Diagnosis not present

## 2023-06-08 DIAGNOSIS — Z801 Family history of malignant neoplasm of trachea, bronchus and lung: Secondary | ICD-10-CM | POA: Insufficient documentation

## 2023-06-08 DIAGNOSIS — C3411 Malignant neoplasm of upper lobe, right bronchus or lung: Secondary | ICD-10-CM

## 2023-06-08 DIAGNOSIS — Z791 Long term (current) use of non-steroidal anti-inflammatories (NSAID): Secondary | ICD-10-CM | POA: Insufficient documentation

## 2023-06-08 DIAGNOSIS — Z79899 Other long term (current) drug therapy: Secondary | ICD-10-CM | POA: Diagnosis not present

## 2023-06-08 DIAGNOSIS — Z7982 Long term (current) use of aspirin: Secondary | ICD-10-CM | POA: Diagnosis not present

## 2023-06-08 DIAGNOSIS — Z8673 Personal history of transient ischemic attack (TIA), and cerebral infarction without residual deficits: Secondary | ICD-10-CM | POA: Insufficient documentation

## 2023-06-08 DIAGNOSIS — Z5111 Encounter for antineoplastic chemotherapy: Secondary | ICD-10-CM | POA: Insufficient documentation

## 2023-06-08 DIAGNOSIS — E876 Hypokalemia: Secondary | ICD-10-CM | POA: Insufficient documentation

## 2023-06-08 DIAGNOSIS — M25569 Pain in unspecified knee: Secondary | ICD-10-CM | POA: Diagnosis not present

## 2023-06-08 DIAGNOSIS — R079 Chest pain, unspecified: Secondary | ICD-10-CM | POA: Insufficient documentation

## 2023-06-08 DIAGNOSIS — Z87891 Personal history of nicotine dependence: Secondary | ICD-10-CM | POA: Insufficient documentation

## 2023-06-08 DIAGNOSIS — J449 Chronic obstructive pulmonary disease, unspecified: Secondary | ICD-10-CM | POA: Diagnosis not present

## 2023-06-08 DIAGNOSIS — Z7952 Long term (current) use of systemic steroids: Secondary | ICD-10-CM | POA: Insufficient documentation

## 2023-06-08 LAB — CMP (CANCER CENTER ONLY)
ALT: 29 U/L (ref 0–44)
AST: 28 U/L (ref 15–41)
Albumin: 3.3 g/dL — ABNORMAL LOW (ref 3.5–5.0)
Alkaline Phosphatase: 72 U/L (ref 38–126)
Anion gap: 8 (ref 5–15)
BUN: 31 mg/dL — ABNORMAL HIGH (ref 8–23)
CO2: 22 mmol/L (ref 22–32)
Calcium: 10 mg/dL (ref 8.9–10.3)
Chloride: 105 mmol/L (ref 98–111)
Creatinine: 1.07 mg/dL (ref 0.61–1.24)
GFR, Estimated: >60 mL/min (ref >60)
Glucose, Bld: 108 mg/dL — ABNORMAL HIGH (ref 70–99)
Potassium: 3.4 mmol/L — ABNORMAL LOW (ref 3.5–5.1)
Sodium: 135 mmol/L (ref 135–145)
Total Bilirubin: 0.8 mg/dL (ref 0.3–1.2)
Total Protein: 6.9 g/dL (ref 6.5–8.1)

## 2023-06-08 LAB — CBC WITH DIFFERENTIAL (CANCER CENTER ONLY)
Abs Immature Granulocytes: 1.56 10*3/uL — ABNORMAL HIGH (ref 0.00–0.07)
Basophils Absolute: 0 10*3/uL (ref 0.0–0.1)
Basophils Relative: 0 %
Eosinophils Absolute: 0 10*3/uL (ref 0.0–0.5)
Eosinophils Relative: 0 %
HCT: 38.4 % — ABNORMAL LOW (ref 39.0–52.0)
Hemoglobin: 12.5 g/dL — ABNORMAL LOW (ref 13.0–17.0)
Immature Granulocytes: 16 %
Lymphocytes Relative: 9 %
Lymphs Abs: 0.9 10*3/uL (ref 0.7–4.0)
MCH: 30.9 pg (ref 26.0–34.0)
MCHC: 32.6 g/dL (ref 30.0–36.0)
MCV: 94.8 fL (ref 80.0–100.0)
Monocytes Absolute: 0.8 10*3/uL (ref 0.1–1.0)
Monocytes Relative: 8 %
Neutro Abs: 6.5 10*3/uL (ref 1.7–7.7)
Neutrophils Relative %: 67 %
Platelet Count: 227 10*3/uL (ref 150–400)
RBC: 4.05 MIL/uL — ABNORMAL LOW (ref 4.22–5.81)
RDW: 25.9 % — ABNORMAL HIGH (ref 11.5–15.5)
Smear Review: NORMAL
WBC Count: 9.8 10*3/uL (ref 4.0–10.5)
nRBC: 0 % (ref 0.0–0.2)

## 2023-06-08 LAB — TSH: TSH: 4.469 u[IU]/mL (ref 0.350–4.500)

## 2023-06-08 MED ORDER — SODIUM CHLORIDE 0.9 % IV SOLN
Freq: Once | INTRAVENOUS | Status: AC
  Filled 2023-06-08: qty 250

## 2023-06-08 MED ORDER — SODIUM CHLORIDE 0.9 % IV SOLN
10.0000 mg/kg | Freq: Once | INTRAVENOUS | Status: AC
  Administered 2023-06-08: 620 mg via INTRAVENOUS
  Filled 2023-06-08: qty 10

## 2023-06-08 MED ORDER — HEPARIN SOD (PORK) LOCK FLUSH 100 UNIT/ML IV SOLN
500.0000 [IU] | Freq: Once | INTRAVENOUS | Status: AC | PRN
  Administered 2023-06-08: 500 [IU]
  Filled 2023-06-08: qty 5

## 2023-06-08 NOTE — Progress Notes (Signed)
Ethan Fitzgerald  [phone] Fax:([phone]) [phone]  ID: Ethan Fitzgerald OB: 05-12-1956  MR#: [id number]  YQM#:[id number]  Patient Care Team: Smitty Cords, DO as PCP - General (Family Medicine) Micki Riley, MD as Referring Physician (Neurology) Glory Buff, RN as Oncology Nurse Navigator Orlie Dakin, Tollie Pizza, MD as Consulting Physician (Oncology)  CHIEF COMPLAINT: Stage IIIa adenocarcinoma of the right upper lobe lung.  INTERVAL HISTORY: Patient returns to clinic today for further evaluation and initiation of maintenance durvalumab.  He continues to feel well and remains asymptomatic.  He has a fair appetite.  He does not complain of pain today.  He has no new neurologic complaints.  He denies any recent fevers or illnesses.   He denies any chest pain, shortness of breath, cough, or hemoptysis.  He has no nausea, vomiting, constipation, or diarrhea.  He has no urinary complaints.  Patient offers no specific complaints today.  REVIEW OF SYSTEMS:   Review of Systems  Constitutional: Negative.  Negative for fever, malaise/fatigue and weight loss.  Respiratory: Negative.  Negative for cough, hemoptysis and shortness of breath.   Cardiovascular: Negative.  Negative for chest pain and leg swelling.  Gastrointestinal: Negative.  Negative for abdominal pain.  Genitourinary: Negative.  Negative for dysuria.  Musculoskeletal: Negative.  Negative for back pain.  Skin: Negative.  Negative for rash.  Neurological: Negative.  Negative for dizziness, focal weakness, weakness and headaches.  Psychiatric/Behavioral: Negative.  The patient is not nervous/anxious.   All other systems reviewed and are negative.   As per HPI. Otherwise, a complete review of systems is negative.  PAST MEDICAL HISTORY: Past Medical History:  Diagnosis Date   COPD (chronic obstructive pulmonary disease) (HCC)    mild   COVID-19 09/2019   Depression    Erectile dysfunction     Headache    daily, stress headaches   Knee pain, left    Seasonal allergies    Stroke (HCC)    Tobacco dependence     PAST SURGICAL HISTORY: Past Surgical History:  Procedure Laterality Date   BACK SURGERY     metal plate in back   COLONOSCOPY     COLONOSCOPY WITH PROPOFOL N/A 04/26/2019   Procedure: COLONOSCOPY WITH PROPOFOL;  Surgeon: Wyline Mood, MD;  Location: [facility] ENDOSCOPY;  Service: Gastroenterology;  Laterality: N/A;   FINE NEEDLE ASPIRATION  03/03/2023   Procedure: FINE NEEDLE ASPIRATION;  Surgeon: Raechel Chute, MD;  Location: [facility] ENDOSCOPY;  Service: Pulmonary;;   HERNIA REPAIR Right    IR ANGIO INTRA EXTRACRAN SEL COM CAROTID INNOMINATE UNI L MOD SED  03/27/2020   IR ANGIO VERTEBRAL SEL SUBCLAVIAN INNOMINATE UNI L MOD SED  03/27/2020   IR ANGIO VERTEBRAL SEL VERTEBRAL UNI R MOD SED  03/27/2020   IR CT HEAD LTD  03/27/2020   IR IMAGING GUIDED PORT INSERTION  03/29/2023   IR PERCUTANEOUS ART THROMBECTOMY/INFUSION INTRACRANIAL INC DIAG ANGIO  03/27/2020   KNEE ARTHROSCOPY Left 03/28/2015   Procedure: Arthroscopic partial medial meniscectomy plus chondral debridement;  Surgeon: Erin Sons, MD;  Location: [facility] SURGERY CN[facility];  Service: Orthopedics;  Laterality: Left;   RADIOLOGY WITH ANESTHESIA N/A 03/27/2020   Procedure: IR WITH ANESTHESIA;  Surgeon: Julieanne Cotton, MD;  Location: [facility] OR;  Service: Radiology;  Laterality: N/A;   VIDEO BRONCHOSCOPY WITH ENDOBRONCHIAL ULTRASOUND  03/03/2023   Procedure: VIDEO BRONCHOSCOPY WITH ENDOBRONCHIAL ULTRASOUND;  Surgeon: Raechel Chute, MD;  Location: [facility] ENDOSCOPY;  Service: Pulmonary;;    FAMILY HISTORY: Family History  Problem Relation Age of Onset   Diabetes Mother    Heart attack Mother 28   Cancer Father        liver    COPD Brother    HIV/AIDS Brother    Prostate cancer Neg Hx    Colon cancer Neg Hx     ADVANCED DIRECTIVES (Y/N):  N  HEALTH MAINTENANCE: Social History   Tobacco Use   Smoking status: Former     Current packs/day: 0.00    Average packs/day: 1.5 packs/day for 49.0 years (73.5 ttl pk-yrs)    Types: Cigarettes    Start date: 57    Quit date: 09/14/2017    Years since quitting: 5.7   Smokeless tobacco: Former  Building services engineer status: Never Used  Substance Use Topics   Alcohol use: Not Currently    Comment: 6 pack on weekend   Drug use: No     Colonoscopy:  PAP:  Bone density:  Lipid panel:  No Known Allergies  Current Outpatient Medications  Medication Sig Dispense Refill   albuterol (VENTOLIN HFA) 108 (90 Base) MCG/ACT inhaler Inhale 2 puffs into the lungs every 4 (four) hours as needed for wheezing or shortness of breath. 1 each 2   b complex vitamins capsule Take 1 capsule by mouth daily.     Cholecalciferol (VITAMIN D) 50 MCG (2000 UT) tablet Take 2,000 Units by mouth daily.     dexamethasone (DECADRON) 4 MG tablet Take 1 tablet (4 mg total) by mouth daily. 30 tablet 2   diphenhydrAMINE (BENADRYL) 50 MG tablet Take 50-100 mg by mouth at bedtime as needed for sleep.     fludrocortisone (FLORINEF) 0.1 MG tablet Take 0.1 mg by mouth daily.     fluticasone (FLONASE) 50 MCG/ACT nasal spray Place 2 sprays into both nostrils daily. (Patient taking differently: Place 2 sprays into both nostrils daily as needed for allergies.) 48 g 3   loratadine (CLARITIN) 10 MG tablet Take 1 tablet (10 mg total) by mouth daily. Use for 4-6 weeks then stop, and use as needed or seasonally (Patient taking differently: Take 10 mg by mouth daily as needed for allergies.) 30 tablet 11   melatonin 5 MG TABS Take 5 mg by mouth at bedtime as needed (sleep).     methocarbamol (ROBAXIN) 500 MG tablet Take 1 tablet (500 mg total) by mouth 4 (four) times daily. 20 tablet 0   midodrine (PROAMATINE) 5 MG tablet Take 10 mg by mouth 3 (three) times daily with meals.     Multiple Vitamin (MULTIVITAMIN WITH MINERALS) TABS tablet Take 1 tablet by mouth daily.     Multiple Vitamins-Minerals (IMMUNE SUPPORT  VITAMIN C PO) Take 1 tablet by mouth daily.     naproxen (NAPROSYN) 500 MG tablet Take 1 tablet (500 mg total) by mouth 2 (two) times daily with a meal. 10 tablet 0   OVER THE COUNTER MEDICATION Take 5 mLs by mouth every other day. [employer] vegan liquid iron     Oxycodone HCl 10 MG TABS Take 1 tablet (10 mg total) by mouth every 6 (six) hours as needed. Take with stool softeners to avoid constipation 120 tablet 0   polyethylene glycol (MIRALAX) 17 g packet Take 17 g by mouth 2 (two) times daily as needed for mild constipation or moderate constipation (Hold if diarrhea.). 100 each 2   rosuvastatin (CRESTOR) 40 MG tablet Take 1 tablet (40 mg total) by mouth every other day. 45 tablet 3  senna (SENOKOT) 8.6 MG TABS tablet Take 1-2 tablets (8.6-17.2 mg total) by mouth 2 (two) times daily as needed for mild constipation or moderate constipation (Hold if diarrhea). 120 tablet 0   sildenafil (REVATIO) 20 MG tablet TAKE 1 TO 5 TABLETS BY MOUTH ABOUT 30 MINUTES PRIOR TO SEX.START WITH 1 THEN INCREASE 30 tablet 5   STIOLTO RESPIMAT 2.5-2.5 MCG/ACT AERS INHALE 2 PUFFS INTO THE LUNGS DAILY. 12 g 3   sucralfate (CARAFATE) 1 g tablet Take 1 tablet (1 g total) by mouth 3 (three) times daily before meals. 90 tablet 1   aspirin EC 325 MG EC tablet Take 1 tablet (325 mg total) by mouth daily. (Patient not taking: Reported on 05/25/2023) 90 tablet 0   No current facility-administered medications for this visit.    OBJECTIVE: Vitals:   06/08/23 0830  BP: 125/89  Pulse: 88  Resp: 16  Temp: (!) 97 F (36.1 C)  SpO2: 99%        Body mass index is 19.8 kg/m.    ECOG FS:1 - Symptomatic but completely ambulatory  General: Well-developed, well-nourished, no acute distress. Eyes: Pink conjunctiva, anicteric sclera. HEENT: Normocephalic, moist mucous membranes. Lungs: No audible wheezing or coughing. Heart: Regular rate and rhythm. Abdomen: Soft, nontender, no obvious distention. Musculoskeletal: No edema,  cyanosis, or clubbing. Neuro: Alert, answering all questions appropriately. Cranial nerves grossly intact. Skin: No rashes or petechiae noted. Psych: Normal affect.  LAB RESULTS:  Lab Results  Component Value Date   NA 135 06/08/2023   K 3.4 (L) 06/08/2023   CL 105 06/08/2023   CO2 22 06/08/2023   GLUCOSE 108 (H) 06/08/2023   BUN 31 (H) 06/08/2023   CREATININE 1.07 06/08/2023   CALCIUM 10.0 06/08/2023   PROT 6.9 06/08/2023   ALBUMIN 3.3 (L) 06/08/2023   AST 28 06/08/2023   ALT 29 06/08/2023   ALKPHOS 72 06/08/2023   BILITOT 0.8 06/08/2023   GFRNONAA >60 06/08/2023   GFRAA 85 12/22/2020    Lab Results  Component Value Date   WBC 9.8 06/08/2023   NEUTROABS 6.5 06/08/2023   HGB 12.5 (L) 06/08/2023   HCT 38.4 (L) 06/08/2023   MCV 94.8 06/08/2023   PLT 227 06/08/2023     STUDIES: No results found.  ASSESSMENT: Stage IIIa adenocarcinoma of the right upper lobe lung.  PLAN:    Stage IIIa adenocarcinoma of the right upper lobe lung: Pathology and imaging reviewed independently confirming diagnosis and stage of disease.  PET scan results from February 14, 2023 reviewed independently with no obvious evidence of metastatic disease.  MRI of the brain from February 11, 2023 also negative for metastatic disease.  Surgical resection was not an option, therefore patient proceeded with concurrent XRT along with weekly carboplatin and Taxol. This will be followed by maintenance durvalumab every 2 weeks for 1 year.  Patient has had port placement.  Patient completed concurrent XRT and chemotherapy on May 27, 2023.  Proceed with cycle 1 of maintenance durvalumab today.  Return to clinic in 2 weeks for further evaluation and consideration of cycle 2.  Will reimage in approximately November 2024.   Chest pain: Patient does not complain of this today.  Continue oxycodone as needed. Iron deficiency anemia: Resolved.  Patient's most recent hemoglobin is 12.5.  He last received IV Venofer on  May 25, 2023. Leukopenia: Resolved. Weight loss: Improving.  Follow-up with dietary as indicated. Hyponatremia: Resolved. Hypokalemia: Mild, monitor.   Patient expressed understanding and was in agreement with  this plan. He also understands that He can call clinic at any time with any questions, concerns, or complaints.    Cancer Staging  Adenocarcinoma of upper lobe of right lung [facility]) Staging form: Lung, AJCC 8th Edition - Clinical stage from 03/15/2023: Stage IIIA (cT4, cN0, cM0) - Signed by Jeralyn Ruths, MD on 03/15/2023 Stage prefix: Initial diagnosis   Jeralyn Ruths, MD   06/08/2023 1:28 PM

## 2023-06-08 NOTE — Patient Instructions (Signed)
[facility]  Discharge Instructions: Thank you for choosing [facility] to provide your oncology and hematology care.  If you have a lab appointment with the Cancer Center, please go directly to the Cancer Center and check in at the registration area.  Wear comfortable clothing and clothing appropriate for easy access to any Portacath or PICC line.   We strive to give you quality time with your provider. You may need to reschedule your appointment if you arrive late (15 or more minutes).  Arriving late affects you and other patients whose appointments are after yours.  Also, if you miss three or more appointments without notifying the office, you may be dismissed from the clinic at the provider's discretion.      For prescription refill requests, have your pharmacy contact our office and allow 72 hours for refills to be completed.    Today you received the following chemotherapy and/or immunotherapy agents Imfinzi        To help prevent nausea and vomiting after your treatment, we encourage you to take your nausea medication as directed.  BELOW ARE SYMPTOMS THAT SHOULD BE REPORTED IMMEDIATELY: *FEVER GREATER THAN 100.4 F (38 C) OR HIGHER *CHILLS OR SWEATING *NAUSEA AND VOMITING THAT IS NOT CONTROLLED WITH YOUR NAUSEA MEDICATION *UNUSUAL SHORTNESS OF BREATH *UNUSUAL BRUISING OR BLEEDING *URINARY PROBLEMS (pain or burning when urinating, or frequent urination) *BOWEL PROBLEMS (unusual diarrhea, constipation, pain near the anus) TENDERNESS IN MOUTH AND THROAT WITH OR WITHOUT PRESENCE OF ULCERS (sore throat, sores in mouth, or a toothache) UNUSUAL RASH, SWELLING OR PAIN  UNUSUAL VAGINAL DISCHARGE OR ITCHING   Items with * indicate a potential emergency and should be followed up as soon as possible or go to the Emergency Department if any problems should occur.  Please show the CHEMOTHERAPY ALERT CARD or IMMUNOTHERAPY ALERT CARD at check-in to  the Emergency Department and triage nurse.  Should you have questions after your visit or need to cancel or reschedule your appointment, please contact [facility] [place] [facility]  [phone] and follow the prompts.  Office hours are 8:00 a.m. to 4:30 p.m. Monday - Friday. Please note that voicemails left after 4:00 p.m. may not be returned until the following business day.  We are closed weekends and major holidays. You have access to a nurse at all times for urgent questions. Please call the main number to the clinic [phone] and follow the prompts.  For any non-urgent questions, you may also contact your provider using MyChart. We now offer e-Visits for anyone 18 and older to request care online for non-urgent symptoms. For details visit [email or web address].   Also download the MyChart app! Go to the app store, search "MyChart", open the app, select [facility], and log in with your MyChart username and password.    

## 2023-06-09 LAB — T4: T4, Total: 5.1 ug/dL (ref 4.5–12.0)

## 2023-06-14 ENCOUNTER — Encounter: Payer: Self-pay | Admitting: Adult Health

## 2023-06-14 ENCOUNTER — Ambulatory Visit: Payer: Medicare HMO | Admitting: Adult Health

## 2023-06-14 VITALS — BP 114/80 | HR 107 | Ht 72.0 in | Wt 147.0 lb

## 2023-06-14 DIAGNOSIS — R569 Unspecified convulsions: Secondary | ICD-10-CM | POA: Diagnosis not present

## 2023-06-14 DIAGNOSIS — Z8673 Personal history of transient ischemic attack (TIA), and cerebral infarction without residual deficits: Secondary | ICD-10-CM

## 2023-06-14 DIAGNOSIS — R296 Repeated falls: Secondary | ICD-10-CM

## 2023-06-14 NOTE — Progress Notes (Signed)
[facility] [address] [place]. [place] [address] [phone]       STROKE FOLLOW UP NOTE  [name] Date of Birth:  09/14/1955 Medical Record Number:  [id number]    Primary neurologist: Dr. Pearlean Fitzgerald Reason for visit: Stroke follow up    SUBJECTIVE:   CHIEF COMPLAINT:  Chief Complaint  Patient presents with   Follow-up    Rm 2, here with wife Ethan Fitzgerald  Pt is here for stroke follow up. Pt states he has been falling a lot, pt states he doesn't know if this is a cause from having chemo and radiation treatment  for lung cancer.      HPI:    Update 06/14/2023 JM: Patient returns for follow-up visit accompanied by his wife.  Previously seen by Dr. Pearlean Fitzgerald 6 months ago for dizziness complaints felt likely related to blood pressure medications vs orthostasis and low suspicion for cerebrovascular etiology.  He was advised to continue midodrine for orthostatic hypotension, completed carotid ultrasound which showed known chronic right ICA occlusion and left ICA previously thought to be occluded may have developed some partial flow.  TCD showed possible stenosis of left MCA but otherwise unremarkable.    During the interval time, he was diagnosed with stage IIIa adenocarcinoma of right upper lung, recently completed XRT and chemo, transition to weekly maintenance therapy. Report since starting XRT and chemo, he has been having frequent falls. Wife reports left arm will start to tremor and then will fall, no tremors in other extremities or any other neurological symptoms. Denies dizziness or lightheadedness prior. Wife notes his eyes appear glazed over lasting a brief period of time right after falling, denies loss of consciousness, denies incontinence or tongue biting, patient aware and alert the entire time per his report, no confusion or fatigue after but does get very angry after, will lay or sit down for a while and this will gradually improve. This was happening  relatively frequently during treatment but has not had any reoccurrence over the past 2 weeks (since completing chemo and XRT). Has been ambulating with cane outdoors, use of RW indoors. Did have a MRI brain completed prior to treatment in July which did not show any new or interval strokes or metastasis.  Denies any new numbness/tingling or weakness.  Does have chronic imbalance since prior stroke but denies any worsening.  He does still have intermittent dizziness but usually associated with blood pressure which continues to fluctuate.  Continues on midodrine TID PRN.   He continues to have daytime fatigue as well as insomnia.  Previously referred for sleep consult and evaluated by Dr. Vickey Fitzgerald in 01/2022 but never pursued HST for unclear reason.  Compliant on aspirin and Crestor.  Routinely follows with PCP for stroke risk factor management      History provided for reference purposes only Update 12/07/2022 Dr. Pearlean Fitzgerald: Patient is referred back for evaluation by primary physician for subjective complaints of dizziness and lightheadedness to rule out cerebrovascular etiology.  Patient states the last couple of months she has had the symptoms.  He was previously on fludrocortisone but recently medications were changed to midodrine 5 mg 3 times daily he seems to be working somewhat better.  Blood pressure still runs low today it is 96/66.  He states that this feeling of dizziness and lightheadedness comes on suddenly.  This does not last long.  He is usually able to sit down and wait to this feeling passes.  He denies any vertigo, nausea,  loss of vision, double vision or extremity weakness or numbness.  Denies any headache.  Complains is limited.  He still has some mild subjective weakness and numbness in his left hand and drags his left leg from his previous stroke 2021 no recurrent stroke or TIA symptoms.  He remains on aspirin which is tolerating well without bruising or bleeding.  States he is tolerating  Crestor well without muscle aches and pains.  Lab work on 12/23/2021 showed LDL cholesterol to be 44 mg percent and hemoglobin A1c to be 5.7.  He has not had any recent brain imaging or neurovascular studies done   Update 09/17/2021 JM: Returns for 41-month stroke follow-up unaccompanied.  Overall stable without new stroke/TIA symptoms. Residual left hand deficits stable and occasional dragging left leg with fatigue (present since stroke).  Compliant on aspirin and Crestor without side effects.  Blood pressure today 129/90. Routinely monitors at home - if sleeps well, am SBP can be around 110-120 but if did not sleep well will be much higher. Continued c/o lightheadedness/dizziness upon standing which was discussed at prior visit. Remains on Florinef 0.1mg  PRN for orthostatic hypotension managed by PCP/cardiology. Does c/o worsening balance over the past 6 months - worse when going up and down stairs and looking up for prolonged period of time then quickly looking down. Denies vision changes, headache or any other neuro deficit associated. Was seen by PCP 11/4 for neck pain and then at urgent care on 11/20 for the same. Rx'd baclofen and did one visit of acupuncture - has improved some. No longer on baclofen. Has had to adjust sleeping position. Fatigue improved some but will still have difficulty sleeping at night at times and decreased endurance and greater fatigue towards end of day.  Remains on vitamin B12 and vitamin D managed by PCP.  He has previously declined pursuing sleep evaluation for possible sleep apnea but now willing to proceed. No further concerns at this time.   Update 03/12/2021 JM: Ethan Fitzgerald returns for 74-month stroke follow-up.  Stable from stroke standpoint without new stroke/TIA symptoms.  Reports residual left finger tip numbness.  He continues to work and will have some difficulty with fine motor skills of left hand as he is left-hand dominant but at times able to compensate with his right  hand.  Compliant aspirin and Crestor associated side effects.  Blood pressure today 135/93.  Daytime fatigue continues especially towards the end of the day after work.  Completed lab work by PCP which did show vitamin B12 and D deficiency and currently on supplement.  He plans on repeating lab work at the end of next month.  He has not yet had follow-up with Dr. Corliss Skains.  He also complains of 1 month onset of lightheadedness and dizziness - usually lasts approx 30 seconds.  Specific triggers include looking up, standing up too quickly from a seated position or when bending over.  He feels like these are the similar symptoms he was previously having with low blood pressure (previously discussed in Septembers appt).  He does endorse drinking 6-8 bottles of water per day and does eat throughout the day.  These typically occur when he is at work so he has not been able to check his blood pressure.  He does have Florinef which he will take as needed for low blood pressure which he will have to use on occasion but typically blood pressure within goal.  Orthostatics: Laying: 132/89, HR 78 Sitting: 129/86, HR 73 Standing: 105/78, HR  83   Update 09/09/2020 JM: Mr. Pfuhl returns for stroke follow-up.  Reports residual left hand fingertip numbness and left facial weakness.  Denies residual speech impairment or cognitive deficit.  He has since returned back to driving and working without difficulty although does experience excessive fatigue by the end of the day - he will typically fall asleep in chair watching TV typically worse after increased exertion. He does not always get adequate sleep at night and at times will have to take a " generic OTC sleeping pill".  Denies new or worsening stroke/TIA symptoms.  Remains on aspirin and Crestor for secondary stroke prevention without side effects.  Blood pressure today 142/96. Monitors at home and typically 120-125/80s.  Remains on Florinef 0.1 mg daily as needed typically  is SBP less than 120.  He does have multiple questions regarding CTA head/neck that was completed by Dr. Corliss Skains 06/2020 which showed continued bilateral ICA occlusions.  No further concerns at this time.  Initial visit 05/07/2020 JM: Mr. 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 He does report dizziness and lightheadedness upon standing. Monitors blood pressures at home which has been ranging 110-120/70s over the past week. No further concerns  Stroke admission 03/27/2020 Mr. YERALDO DORFF is a 67 y.o. male with history of COPD who presented on 03/27/2020 with L sided weakness.  Stroke work-up revealed right MCA and multiple right and left anterior circulation infarcts s/p tPA and IR with partial revascularization of R MCA and s/p angioplasty for acute R ICA occlusion with reocclusion with resultant small SAH, most likely secondary to large vessel disease with questionable dissection due to  coughing episode vs hypothyroid state from parotid neoplasm.  Recommended aspirin alone until [facility] resolves and then consider DAPT for 3 months then aspirin alone.  Incidental finding of parotid mass on MRI suspicious for primary neoplasm and recommended ENT for further evaluation.  Carotid stenosis with chronic left ICA occlusion and acute right ICA occlusion s/p R ICA angioplasty with reocclusion and questionable dissection from cough with long-term BP goal 130-150.  LDL 82 and recommended continuation of Crestor 20 mg daily.  Other stroke risk factors include advanced age, former tobacco use, former smokeless tobacco use and EtOH use.  No prior stroke history.  Residual deficits of mild dysarthria, left upper and lower facial weakness, dysphagia, and mild left hemiparesis and discharged to CIR for ongoing therapy needs.  Stroke:   R MCA and multiple R and L anterior circulation infarcts s/p tPA and IR w/ partial revascularization with resultant small SAH, most likely secondary to large vessel disease with ? dissection due to coughing episodes, vs. Hypercoagulable  state from parotid neoplasm  CT head dense R M1. Subtle asymmetric hypodensity R caudate and lentiform nucleus. ASPECTS 8   CTA head & neck R ICA LVO at bifurcation origin w/ cavernous reconstitution and reocclusion proximal R M1. Good MCA collaterals. L ICA occlusion at bifurcation origin. Short severe L ICA cavernous stenosis.   Cerebral angio partial revascularization R MCA dominant division w/ TICI2a/TICI2b. S/p angioplasty acute R ICA occlusion w/ reocclusion    Post IR CT mild to mod RT peri-insular and post frontal convexity contrast stain vs SAH  MRI  R MCA frontal lobe infarct w/ possible mild petechial hemorrhage. B ICA siphon high-grade stenosis vs occlusion. R MCA SAH.  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 No source of embolus  LDL 82   HgbA1c 5.6 VTE prophylaxis - SCDs  No antithrombotic prior to admission, now on ASA alone due to small SAH. Plan to repeat CT on 7/30, if SAH resolves or nearly resoved, will consider DAPT for 3 months.  Therapy recommendations:  CIR - consult placed Disposition:  pending - await for CIR input, if not a candidate will d/c home     ROS:   14 system review of systems performed and negative with exception of see HPI  PMH:  Past Medical History:  Diagnosis Date   COPD (chronic obstructive pulmonary disease) (HCC)    mild   COVID-19 09/2019   Depression    Erectile dysfunction    Headache    daily, stress headaches   Knee pain, left    Seasonal allergies    Stroke (HCC)    Tobacco dependence     PSH:  Past Surgical History:  Procedure Laterality Date   BACK SURGERY     metal plate in back   COLONOSCOPY     COLONOSCOPY WITH PROPOFOL N/A 04/26/2019   Procedure: COLONOSCOPY WITH PROPOFOL;  Surgeon: Wyline Mood, MD;  Location: [facility] ENDOSCOPY;  Service: Gastroenterology;  Laterality: N/A;   FINE NEEDLE ASPIRATION  03/03/2023   Procedure: FINE NEEDLE ASPIRATION;  Surgeon: Raechel Chute, MD;  Location: [facility] ENDOSCOPY;  Service: Pulmonary;;   HERNIA REPAIR Right    IR ANGIO INTRA EXTRACRAN SEL COM CAROTID INNOMINATE UNI L MOD SED  03/27/2020   IR ANGIO VERTEBRAL SEL SUBCLAVIAN INNOMINATE UNI L MOD SED  03/27/2020   IR ANGIO VERTEBRAL SEL VERTEBRAL UNI R MOD SED  03/27/2020   IR CT HEAD LTD  03/27/2020   IR IMAGING GUIDED PORT INSERTION  03/29/2023   IR PERCUTANEOUS ART THROMBECTOMY/INFUSION INTRACRANIAL INC DIAG ANGIO  03/27/2020   KNEE ARTHROSCOPY Left 03/28/2015   Procedure: Arthroscopic partial medial meniscectomy plus chondral debridement;  Surgeon: Erin Sons, MD;  Location: [facility];  Service: Orthopedics;  Laterality: Left;   RADIOLOGY WITH ANESTHESIA N/A 03/27/2020   Procedure: IR WITH ANESTHESIA;  Surgeon: Julieanne Cotton, MD;  Location: [facility] OR;  Service:  Radiology;  Laterality: N/A;   VIDEO BRONCHOSCOPY WITH ENDOBRONCHIAL ULTRASOUND  03/03/2023   Procedure: VIDEO BRONCHOSCOPY WITH ENDOBRONCHIAL ULTRASOUND;  Surgeon: Raechel Chute, MD;  Location: [facility] ENDOSCOPY;  Service: Pulmonary;;    Social History:  Social History   Socioeconomic History   Marital status: Married    Spouse name: Ethan Fitzgerald   Number of children: 0   Years of education: Not on file   Highest education level: Some college, no degree  Occupational History   Not on file  Tobacco  Use   Smoking status: Former    Current packs/day: 0.00    Average packs/day: 1.5 packs/day for 49.0 years (73.5 ttl pk-yrs)    Types: Cigarettes    Start date: 43    Quit date: 09/14/2017    Years since quitting: 5.7   Smokeless tobacco: Former  Building services engineer status: Never Used  Substance and Sexual Activity   Alcohol use: Not Currently    Comment: 6 pack on weekend   Drug use: No   Sexual activity: Not on file  Other Topics Concern   Not on file  Social History Narrative   Lives at home w wife   L handed   Caffeine: 1 C of coffee AM   Social Determinants of Health   Financial Resource Strain: Low Risk  (02/09/2023)   Overall Financial Resource Strain (CARDIA)    Difficulty of Paying Living Expenses: Not hard at all  Food Insecurity: No Food Insecurity (02/09/2023)   Hunger Vital Sign    Worried About Running Out of Food in the Last Year: Never true    Ran Out of Food in the Last Year: Never true  Transportation Needs: No Transportation Needs (02/09/2023)   PRAPARE - Administrator, Civil Service (Medical): No    Lack of Transportation (Non-Medical): No  Physical Activity: Insufficiently Active (11/05/2022)   Exercise Vital Sign    Days of Exercise per Week: 3 days    Minutes of Exercise per Session: 20 min  Stress: No Stress Concern Present (11/05/2022)   [employer] of Occupational Health - Occupational Stress Questionnaire    Feeling of Stress : Not at all   Social Connections: Socially Isolated (11/05/2022)   Social Connection and Isolation Panel [NHANES]    Frequency of Communication with Friends and Family: Once a week    Frequency of Social Gatherings with Friends and Family: Never    Attends Religious Services: Never    Database administrator or Organizations: No    Attends Banker Meetings: Never    Marital Status: Married  Catering manager Violence: Not At Risk (02/09/2023)   Humiliation, Afraid, Rape, and Kick questionnaire    Fear of Current or Ex-Partner: No    Emotionally Abused: No    Physically Abused: No    Sexually Abused: No    Family History:  Family History  Problem Relation Age of Onset   Diabetes Mother    Heart attack Mother 60   Cancer Father        liver    COPD Brother    HIV/AIDS Brother    Prostate cancer Neg Hx    Colon cancer Neg Hx     Medications:   Current Outpatient Medications on File Prior to Visit  Medication Sig Dispense Refill   albuterol (VENTOLIN HFA) 108 (90 Base) MCG/ACT inhaler Inhale 2 puffs into the lungs every 4 (four) hours as needed for wheezing or shortness of breath. 1 each 2   aspirin EC 325 MG EC tablet Take 1 tablet (325 mg total) by mouth daily. 90 tablet 0   b complex vitamins capsule Take 1 capsule by mouth daily.     Cholecalciferol (VITAMIN D) 50 MCG (2000 UT) tablet Take 2,000 Units by mouth daily.     fludrocortisone (FLORINEF) 0.1 MG tablet Take 0.1 mg by mouth daily.     fluticasone (FLONASE) 50 MCG/ACT nasal spray Place 2 sprays into both nostrils daily. (Patient taking differently: Place  2 sprays into both nostrils daily as needed for allergies.) 48 g 3   melatonin 5 MG TABS Take 5 mg by mouth at bedtime as needed (sleep).     midodrine (PROAMATINE) 5 MG tablet Take 10 mg by mouth 3 (three) times daily with meals.     Multiple Vitamin (MULTIVITAMIN WITH MINERALS) TABS tablet Take 1 tablet by mouth daily.     Multiple Vitamins-Minerals (IMMUNE SUPPORT  VITAMIN C PO) Take 1 tablet by mouth daily.     naproxen (NAPROSYN) 500 MG tablet Take 1 tablet (500 mg total) by mouth 2 (two) times daily with a meal. 10 tablet 0   OVER THE COUNTER MEDICATION Take 5 mLs by mouth every other day. [employer]'s vegan liquid iron     Oxycodone HCl 10 MG TABS Take 1 tablet (10 mg total) by mouth every 6 (six) hours as needed. Take with stool softeners to avoid constipation 120 tablet 0   polyethylene glycol (MIRALAX) 17 g packet Take 17 g by mouth 2 (two) times daily as needed for mild constipation or moderate constipation (Hold if diarrhea.). 100 each 2   rosuvastatin (CRESTOR) 40 MG tablet Take 1 tablet (40 mg total) by mouth every other day. 45 tablet 3   senna (SENOKOT) 8.6 MG TABS tablet Take 1-2 tablets (8.6-17.2 mg total) by mouth 2 (two) times daily as needed for mild constipation or moderate constipation (Hold if diarrhea). 120 tablet 0   sildenafil (REVATIO) 20 MG tablet TAKE 1 TO 5 TABLETS BY MOUTH ABOUT 30 MINUTES PRIOR TO SEX.START WITH 1 THEN INCREASE 30 tablet 5   STIOLTO RESPIMAT 2.5-2.5 MCG/ACT AERS INHALE 2 PUFFS INTO THE LUNGS DAILY. 12 g 3   sucralfate (CARAFATE) 1 g tablet Take 1 tablet (1 g total) by mouth 3 (three) times daily before meals. 90 tablet 1   dexamethasone (DECADRON) 4 MG tablet Take 1 tablet (4 mg total) by mouth daily. (Patient not taking: Reported on 06/14/2023) 30 tablet 2   No current facility-administered medications on file prior to visit.    Allergies:  No Known Allergies    OBJECTIVE:  Physical Exam  Vitals:   06/14/23 1251  BP: 114/80  Pulse: (!) 107  Weight: 147 lb (66.7 kg)  Height: 6' (1.829 m)   Body mass index is 19.94 kg/m. No results found.  General: well developed, well nourished, very pleasant middle-age Caucasian male, seated, in no evident distress  Neurologic Exam Mental Status: Awake and fully alert. Fluent speech and language. Oriented to place and time. Recent memory subjectively impaired  and remote memory intact. Attention span, concentration and fund of knowledge appropriate during visit.  Mood and affect appropriate.  Cranial Nerves: Pupils equal, briskly reactive to light. Extraocular movements full without nystagmus.  Visual fields full to confrontation. Hearing intact. Facial sensation intact.  Mild left lower facial weakness. Motor: Normal bulk and tone. Normal strength in all tested extremity muscles except slightly decreased left hand dexterity.  Orbits right arm over left arm. Sensory.: intact to touch , pinprick , position and vibratory sensation.  Coordination: Rapid alternating movements normal in all extremities except slight decrease left hand. Finger-to-nose and heel-to-shin performed accurately bilaterally.   Gait and Station: Arises from chair without difficulty. Stance is normal. Gait demonstrates normal stride length and balance although greater difficulty with turns and use of cane, tandem walk and heel toe not attempted Reflexes: 1+ and symmetric. Toes downgoing.       ASSESSMENT: Ethan Fitzgerald  is a 67 y.o. year old male with R MCA and multiple b/l anterior circulation strokes on 03/27/2020 s/p tPA and IR with partial revascularization and s/p angioplasty for acute right ICA occlusion with reocclusion, infarct most likely secondary to large vessel disease with questionable dissection vs hypercoagulable state from parotid neoplasm. Vascular risk factors include parotid mass, carotid stenosis, HLD, former tobacco use and EtOH use.  Evaluated in 12/2022 for new complaints of subjective dizziness and lightheadedness for the last month or so possibly related to change in his blood pressure medications versus orthostasis, doubt cerebrovascular etiology.  Diagnosed with adenocarcinoma of right lung in 02/2023 s/p XRT and chemo. Reports frequent falls of unclear etiology since starting XRT and chemo treatments.      PLAN:  R MCA stroke, B anterior circulation infarcts:   Residual deficit: Left hand fingertip numbness with slight decreased dexterity and facial weakness - stable.   Continue aspirin 325 mg daily and Crestor 20 mg daily for secondary stroke prevention.  Discussed secondary stroke prevention measures and importance of close PCP follow up for aggressive stroke risk factor management including BP goal<130/90 and HLD with LDL goal<70 Stroke labs 12/2022: LDL 53, A1c 6.1  Recurrent falls Starts with LUE tremor and then will fall, blank stare right after falling, lasting only short duration, patient aware during event, no confusion or fatigue after but does have mood change with anger for short duration after No LOC/AMS, denies dizziness/lightheadedness prior to fall Unclear etiology - possibly in setting of deconditioning with chemo/XRT but unable to completely rule out focal seizure especially with left arm tremor prior to fall and blank stare right after Recommend completion of EEG as patient is at risk of seizures with hx of stroke Unsure if related to chemo/XRT as falls began after starting treatment and have since subsided since completing - advised to also discuss further with oncology Advised to call if episodes return - could consider trial of ASM to see if this stops episodes, may also consider repeat MRI brain but do not feel this is warranted at this time as MRI brain 03/2023 without evidence of metastasis or new or interval strokes and neuro exam intact today.  Declines interest in PT at this time - advised continued use of AD for fall prevention  Excessive daytime fatigue:  Previously evaluated by Dr. Vickey Fitzgerald who recommended proceeding with HST in 01/2022 but never pursued for unclear reason.  He may consider completing HST but would like to hold for now in setting of frequent cancer treatments Discussed untreated sleep apnea increased risk of stroke and cardiovascular disease as well as possibly contributing to short-term memory  difficulties  Orthostatic hypotension: Intermittent dizziness/lightheadedness associated with position change Currently on Florinef 0.1 mg PRN managed by PCP BP goal 130-150 to ensure adequate perfusion  Carotid stenosis:  Followed by Dr. Corliss Skains Carotid ultrasound 12/2022 right ICA total occlusion, L ICA previously observed occluded now with narrow track of flow extending from bifurcation CTA 08/2021 R ICA occluded from cervical bifurcation through terminal ICA and reconstitution of left ICA at the level of ophthalmic artery, vertebral arteries unremarkable. Plans to repeat imaging around 02/2022    Follow up in 6 months or call earlier if needed   CC:  Smitty Cords, DO    I spent 45 minutes of face-to-face and non-face-to-face time with patient and wife.  This included previsit chart review, lab review, study review, order entry, electronic health record documentation, patient and wife education discussion regarding above  diagnoses and treatment plan and answered all other questions to patient and wife satisfaction  Ihor Austin, [facility]  [facility] [address] [place], [place] [address]  Phone [phone] Fax [phone] Note: This document was prepared with digital dictation and possible smart phrase technology. Any transcriptional errors that result from this process are unintentional.

## 2023-06-14 NOTE — Patient Instructions (Signed)
Recommend completing an EEG to evaluate for possible seizures - please call with any additional episodes. We may consider repeat MRI brain to rule out vascular etiology and starting anti seizure medication to see if this stops episodes of falling  Continue use of assistive device at all times unless otherwise instructed   Please let me know when you are ready to pursue sleep study to evaluate for sleep apnea  Continue aspirin 325 mg daily  and Crestor for secondary stroke prevention  Continue to follow up with PCP regarding blood pressure and cholesterol management  Maintain strict control of hypertension with blood pressure goal below 130/90 and cholesterol with LDL cholesterol (bad cholesterol) goal below 70 mg/dL.   Signs of a Stroke? Follow the BEFAST method:  Balance Watch for a sudden loss of balance, trouble with coordination or vertigo Eyes Is there a sudden loss of vision in one or both eyes? Or double vision?  Face: Ask the person to smile. Does one side of the face droop or is it numb?  Arms: Ask the person to raise both arms. Does one arm drift downward? Is there weakness or numbness of a leg? Speech: Ask the person to repeat a simple phrase. Does the speech sound slurred/strange? Is the person confused ? Time: If you observe any of these signs, call 911.     Followup in the future with me in 6 months or call earlier if needed       Thank you for coming to see [place] at [facility]. I hope we have been able to provide you high quality care today.  You may receive a patient satisfaction survey over the next few weeks. We would appreciate your feedback and comments so that we may continue to improve ourselves and the health of our patients.

## 2023-06-17 ENCOUNTER — Other Ambulatory Visit: Payer: Self-pay | Admitting: Oncology

## 2023-06-22 ENCOUNTER — Inpatient Hospital Stay: Payer: Medicare HMO

## 2023-06-22 ENCOUNTER — Inpatient Hospital Stay: Payer: Medicare HMO | Admitting: Oncology

## 2023-06-22 ENCOUNTER — Encounter: Payer: Self-pay | Admitting: Oncology

## 2023-06-22 VITALS — HR 92

## 2023-06-22 DIAGNOSIS — R079 Chest pain, unspecified: Secondary | ICD-10-CM | POA: Diagnosis not present

## 2023-06-22 DIAGNOSIS — Z8673 Personal history of transient ischemic attack (TIA), and cerebral infarction without residual deficits: Secondary | ICD-10-CM | POA: Diagnosis not present

## 2023-06-22 DIAGNOSIS — C3411 Malignant neoplasm of upper lobe, right bronchus or lung: Secondary | ICD-10-CM | POA: Diagnosis not present

## 2023-06-22 DIAGNOSIS — Z87891 Personal history of nicotine dependence: Secondary | ICD-10-CM | POA: Diagnosis not present

## 2023-06-22 DIAGNOSIS — J449 Chronic obstructive pulmonary disease, unspecified: Secondary | ICD-10-CM | POA: Diagnosis not present

## 2023-06-22 DIAGNOSIS — Z8616 Personal history of COVID-19: Secondary | ICD-10-CM | POA: Diagnosis not present

## 2023-06-22 DIAGNOSIS — Z5111 Encounter for antineoplastic chemotherapy: Secondary | ICD-10-CM | POA: Diagnosis not present

## 2023-06-22 DIAGNOSIS — E876 Hypokalemia: Secondary | ICD-10-CM | POA: Diagnosis not present

## 2023-06-22 DIAGNOSIS — M25569 Pain in unspecified knee: Secondary | ICD-10-CM | POA: Diagnosis not present

## 2023-06-22 DIAGNOSIS — Z7952 Long term (current) use of systemic steroids: Secondary | ICD-10-CM | POA: Diagnosis not present

## 2023-06-22 LAB — CBC WITH DIFFERENTIAL (CANCER CENTER ONLY)
Abs Immature Granulocytes: 1.66 10*3/uL — ABNORMAL HIGH (ref 0.00–0.07)
Basophils Absolute: 0 10*3/uL (ref 0.0–0.1)
Basophils Relative: 0 %
Eosinophils Absolute: 0.1 10*3/uL (ref 0.0–0.5)
Eosinophils Relative: 1 %
HCT: 39.1 % (ref 39.0–52.0)
Hemoglobin: 13 g/dL (ref 13.0–17.0)
Immature Granulocytes: 12 %
Lymphocytes Relative: 7 %
Lymphs Abs: 0.9 10*3/uL (ref 0.7–4.0)
MCH: 31.8 pg (ref 26.0–34.0)
MCHC: 33.2 g/dL (ref 30.0–36.0)
MCV: 95.6 fL (ref 80.0–100.0)
Monocytes Absolute: 0.8 10*3/uL (ref 0.1–1.0)
Monocytes Relative: 6 %
Neutro Abs: 9.9 10*3/uL — ABNORMAL HIGH (ref 1.7–7.7)
Neutrophils Relative %: 74 %
Platelet Count: 233 10*3/uL (ref 150–400)
RBC: 4.09 MIL/uL — ABNORMAL LOW (ref 4.22–5.81)
RDW: 24.9 % — ABNORMAL HIGH (ref 11.5–15.5)
Smear Review: NORMAL
WBC Count: 13.4 10*3/uL — ABNORMAL HIGH (ref 4.0–10.5)
nRBC: 0.2 % (ref 0.0–0.2)

## 2023-06-22 LAB — CMP (CANCER CENTER ONLY)
ALT: 33 U/L (ref 0–44)
AST: 27 U/L (ref 15–41)
Albumin: 3.2 g/dL — ABNORMAL LOW (ref 3.5–5.0)
Alkaline Phosphatase: 72 U/L (ref 38–126)
Anion gap: 7 (ref 5–15)
BUN: 24 mg/dL — ABNORMAL HIGH (ref 8–23)
CO2: 24 mmol/L (ref 22–32)
Calcium: 10.2 mg/dL (ref 8.9–10.3)
Chloride: 107 mmol/L (ref 98–111)
Creatinine: 1.12 mg/dL (ref 0.61–1.24)
GFR, Estimated: >60 mL/min (ref >60)
Glucose, Bld: 96 mg/dL (ref 70–99)
Potassium: 3.3 mmol/L — ABNORMAL LOW (ref 3.5–5.1)
Sodium: 138 mmol/L (ref 135–145)
Total Bilirubin: 0.8 mg/dL (ref 0.3–1.2)
Total Protein: 7.2 g/dL (ref 6.5–8.1)

## 2023-06-22 MED ORDER — SODIUM CHLORIDE 0.9 % IV SOLN
Freq: Once | INTRAVENOUS | Status: AC
  Filled 2023-06-22: qty 250

## 2023-06-22 MED ORDER — HEPARIN SOD (PORK) LOCK FLUSH 100 UNIT/ML IV SOLN
500.0000 [IU] | Freq: Once | INTRAVENOUS | Status: AC | PRN
  Administered 2023-06-22: 500 [IU]
  Filled 2023-06-22: qty 5

## 2023-06-22 MED ORDER — SODIUM CHLORIDE 0.9 % IV SOLN
10.0000 mg/kg | Freq: Once | INTRAVENOUS | Status: AC
  Administered 2023-06-22: 620 mg via INTRAVENOUS
  Filled 2023-06-22: qty 10

## 2023-06-22 MED ORDER — SODIUM CHLORIDE 0.9% FLUSH
10.0000 mL | INTRAVENOUS | Status: DC | PRN
  Filled 2023-06-22: qty 10

## 2023-06-22 NOTE — Progress Notes (Signed)
Nutrition Follow-up:  Patient with lung cancer.  Patient has completed concurrent chemotherapy and radiation.  Receiving imfinzi currently.  Met with patient during infusion. Reports good appetite and weight gain.  My wife prepares meals for me and stays on me to eat.  Drinking ensure usually in the morning.  Having meats, vegetables, banana bread, grilled cheese sandwich.  Denies any nutrition impact symptoms at this time.   Medications: reviewed  Labs: reviewed  Anthropometrics:   Weight 155 lb   143 lb on 9/10 140 lb on 8/27 141 lb on 8/6 152 lb on 6/1   NUTRITION DIAGNOSIS: Inadequate oral intake improving   INTERVENTION:  Encouraged well balanced diet including lean proteins and plant foods now that appetite has improved and along with weight.     MONITORING, EVALUATION, GOAL: weight trends, intake   NEXT VISIT: Wednesday, Nov 27 during infusion  Maclain Cohron B. Freida Busman, RD, LDN Registered Dietitian [phone]

## 2023-06-22 NOTE — Patient Instructions (Signed)
[place] [facility]  Discharge Instructions: Thank you for choosing [facility] to provide your oncology and hematology care.  If you have a lab appointment with the Cancer Center, please go directly to the Cancer Center and check in at the registration area.  Wear comfortable clothing and clothing appropriate for easy access to any Portacath or PICC line.   We strive to give you quality time with your provider. You may need to reschedule your appointment if you arrive late (15 or more minutes).  Arriving late affects you and other patients whose appointments are after yours.  Also, if you miss three or more appointments without notifying the office, you may be dismissed from the clinic at the provider's discretion.      For prescription refill requests, have your pharmacy contact our office and allow 72 hours for refills to be completed.    Today you received the following chemotherapy and/or immunotherapy agents- Durvalumab      To help prevent nausea and vomiting after your treatment, we encourage you to take your nausea medication as directed.  BELOW ARE SYMPTOMS THAT SHOULD BE REPORTED IMMEDIATELY: *FEVER GREATER THAN 100.4 F (38 C) OR HIGHER *CHILLS OR SWEATING *NAUSEA AND VOMITING THAT IS NOT CONTROLLED WITH YOUR NAUSEA MEDICATION *UNUSUAL SHORTNESS OF BREATH *UNUSUAL BRUISING OR BLEEDING *URINARY PROBLEMS (pain or burning when urinating, or frequent urination) *BOWEL PROBLEMS (unusual diarrhea, constipation, pain near the anus) TENDERNESS IN MOUTH AND THROAT WITH OR WITHOUT PRESENCE OF ULCERS (sore throat, sores in mouth, or a toothache) UNUSUAL RASH, SWELLING OR PAIN  UNUSUAL VAGINAL DISCHARGE OR ITCHING   Items with * indicate a potential emergency and should be followed up as soon as possible or go to the Emergency Department if any problems should occur.  Please show the CHEMOTHERAPY ALERT CARD or IMMUNOTHERAPY ALERT CARD at check-in  to the Emergency Department and triage nurse.  Should you have questions after your visit or need to cancel or reschedule your appointment, please contact [place] [facility]  [phone] and follow the prompts.  Office hours are 8:00 a.m. to 4:30 p.m. Monday - Friday. Please note that voicemails left after 4:00 p.m. may not be returned until the following business day.  We are closed weekends and major holidays. You have access to a nurse at all times for urgent questions. Please call the main number to the clinic [phone] and follow the prompts.  For any non-urgent questions, you may also contact your provider using MyChart. We now offer e-Visits for anyone 67 and older to request care online for non-urgent symptoms. For details visit [email or web address].   Also download the MyChart app! Go to the app store, search "MyChart", open the app, select [place], and log in with your MyChart username and password.

## 2023-06-22 NOTE — Progress Notes (Signed)
[facility]  [phone] [phone]) [phone]  ID: Ethan Fitzgerald OB: 12-24-1955  MR#: [id number]  AOZ#:[id number]  Patient Care Team: Smitty Cords, DO as PCP - General (Family Medicine) Micki Riley, MD as Referring Physician (Neurology) Glory Buff, RN as Oncology Nurse Navigator Orlie Dakin, Tollie Pizza, MD as Consulting Physician (Oncology)  CHIEF COMPLAINT: Stage IIIa adenocarcinoma of the right upper lobe lung.  INTERVAL HISTORY: Patient returns to clinic today for further evaluation and consideration of cycle 2 of maintenance durvalumab.  He tolerated his first treatment well without significant side effects.  He currently feels well and is asymptomatic. He has a fair appetite.  He does not complain of pain today.  He has no new neurologic complaints.  He denies any recent fevers or illnesses.   He denies any chest pain, shortness of breath, cough, or hemoptysis.  He has no nausea, vomiting, constipation, or diarrhea.  He has no urinary complaints.  Patient offers no specific complaints today.  REVIEW OF SYSTEMS:   Review of Systems  Constitutional: Negative.  Negative for fever, malaise/fatigue and weight loss.  Respiratory: Negative.  Negative for cough, hemoptysis and shortness of breath.   Cardiovascular: Negative.  Negative for chest pain and leg swelling.  Gastrointestinal: Negative.  Negative for abdominal pain.  Genitourinary: Negative.  Negative for dysuria.  Musculoskeletal: Negative.  Negative for back pain.  Skin: Negative.  Negative for rash.  Neurological: Negative.  Negative for dizziness, focal weakness, weakness and headaches.  Psychiatric/Behavioral: Negative.  The patient is not nervous/anxious.   All other systems reviewed and are negative.   As per HPI. Otherwise, a complete review of systems is negative.  PAST MEDICAL HISTORY: Past Medical History:  Diagnosis Date   COPD (chronic obstructive pulmonary disease)  (HCC)    mild   COVID-19 09/2019   Depression    Erectile dysfunction    Headache    daily, stress headaches   Knee pain, left    Seasonal allergies    Stroke (HCC)    Tobacco dependence     PAST SURGICAL HISTORY: Past Surgical History:  Procedure Laterality Date   BACK SURGERY     metal plate in back   COLONOSCOPY     COLONOSCOPY WITH PROPOFOL N/A 04/26/2019   Procedure: COLONOSCOPY WITH PROPOFOL;  Surgeon: Wyline Mood, MD;  Location: [facility] ENDOSCOPY;  Service: Gastroenterology;  Laterality: N/A;   FINE NEEDLE ASPIRATION  03/03/2023   Procedure: FINE NEEDLE ASPIRATION;  Surgeon: Raechel Chute, MD;  Location: [facility] ENDOSCOPY;  Service: Pulmonary;;   HERNIA REPAIR Right    IR ANGIO INTRA EXTRACRAN SEL COM CAROTID INNOMINATE UNI L MOD SED  03/27/2020   IR ANGIO VERTEBRAL SEL SUBCLAVIAN INNOMINATE UNI L MOD SED  03/27/2020   IR ANGIO VERTEBRAL SEL VERTEBRAL UNI R MOD SED  03/27/2020   IR CT HEAD LTD  03/27/2020   IR IMAGING GUIDED PORT INSERTION  03/29/2023   IR PERCUTANEOUS ART THROMBECTOMY/INFUSION INTRACRANIAL INC DIAG ANGIO  03/27/2020   KNEE ARTHROSCOPY Left 03/28/2015   Procedure: Arthroscopic partial medial meniscectomy plus chondral debridement;  Surgeon: Erin Sons, MD;  Location: [facility];  Service: Orthopedics;  Laterality: Left;   RADIOLOGY WITH ANESTHESIA N/A 03/27/2020   Procedure: IR WITH ANESTHESIA;  Surgeon: Julieanne Cotton, MD;  Location: [facility] OR;  Service: Radiology;  Laterality: N/A;   VIDEO BRONCHOSCOPY WITH ENDOBRONCHIAL ULTRASOUND  03/03/2023   Procedure: VIDEO BRONCHOSCOPY WITH ENDOBRONCHIAL ULTRASOUND;  Surgeon: Raechel Chute, MD;  Location: [facility]  ENDOSCOPY;  Service: Pulmonary;;    FAMILY HISTORY: Family History  Problem Relation Age of Onset   Diabetes Mother    Heart attack Mother 27   Cancer Father        liver    COPD Brother    HIV/AIDS Brother    Prostate cancer Neg Hx    Colon cancer Neg Hx     ADVANCED DIRECTIVES (Y/N):  N  HEALTH  MAINTENANCE: Social History   Tobacco Use   Smoking status: Former    Current packs/day: 0.00    Average packs/day: 1.5 packs/day for 49.0 years (73.5 ttl pk-yrs)    Types: Cigarettes    Start date: 64    Quit date: 09/14/2017    Years since quitting: 5.7   Smokeless tobacco: Former  Building services engineer status: Never Used  Substance Use Topics   Alcohol use: Not Currently    Comment: 6 pack on weekend   Drug use: No     Colonoscopy:  PAP:  Bone density:  Lipid panel:  No Known Allergies  Current Outpatient Medications  Medication Sig Dispense Refill   albuterol (VENTOLIN HFA) 108 (90 Base) MCG/ACT inhaler Inhale 2 puffs into the lungs every 4 (four) hours as needed for wheezing or shortness of breath. 1 each 2   aspirin EC 325 MG EC tablet Take 1 tablet (325 mg total) by mouth daily. 90 tablet 0   b complex vitamins capsule Take 1 capsule by mouth daily.     Cholecalciferol (VITAMIN D) 50 MCG (2000 UT) tablet Take 2,000 Units by mouth daily.     dexamethasone (DECADRON) 4 MG tablet TAKE 1 TABLET EVERY DAY 90 tablet 3   fludrocortisone (FLORINEF) 0.1 MG tablet Take 0.1 mg by mouth daily.     fluticasone (FLONASE) 50 MCG/ACT nasal spray Place 2 sprays into both nostrils daily. (Patient taking differently: Place 2 sprays into both nostrils daily as needed for allergies.) 48 g 3   melatonin 5 MG TABS Take 5 mg by mouth at bedtime as needed (sleep).     midodrine (PROAMATINE) 5 MG tablet Take 10 mg by mouth 3 (three) times daily with meals.     Multiple Vitamin (MULTIVITAMIN WITH MINERALS) TABS tablet Take 1 tablet by mouth daily.     Multiple Vitamins-Minerals (IMMUNE SUPPORT VITAMIN C PO) Take 1 tablet by mouth daily.     naproxen (NAPROSYN) 500 MG tablet Take 1 tablet (500 mg total) by mouth 2 (two) times daily with a meal. 10 tablet 0   OVER THE COUNTER MEDICATION Take 5 mLs by mouth every other day. Mary Ruth's vegan liquid iron     Oxycodone HCl 10 MG TABS Take 1 tablet (10  mg total) by mouth every 6 (six) hours as needed. Take with stool softeners to avoid constipation 120 tablet 0   polyethylene glycol (MIRALAX) 17 g packet Take 17 g by mouth 2 (two) times daily as needed for mild constipation or moderate constipation (Hold if diarrhea.). 100 each 2   rosuvastatin (CRESTOR) 40 MG tablet Take 1 tablet (40 mg total) by mouth every other day. 45 tablet 3   senna (SENOKOT) 8.6 MG TABS tablet Take 1-2 tablets (8.6-17.2 mg total) by mouth 2 (two) times daily as needed for mild constipation or moderate constipation (Hold if diarrhea). 120 tablet 0   sildenafil (REVATIO) 20 MG tablet TAKE 1 TO 5 TABLETS BY MOUTH ABOUT 30 MINUTES PRIOR TO SEX.START WITH 1 THEN INCREASE 30  tablet 5   STIOLTO RESPIMAT 2.5-2.5 MCG/ACT AERS INHALE 2 PUFFS INTO THE LUNGS DAILY. 12 g 3   sucralfate (CARAFATE) 1 g tablet Take 1 tablet (1 g total) by mouth 3 (three) times daily before meals. 90 tablet 1   No current facility-administered medications for this visit.   Facility-Administered Medications Ordered in Other Visits  Medication Dose Route Frequency Provider Last Rate Last Admin   sodium chloride flush (NS) 0.9 % injection 10 mL  10 mL Intracatheter PRN Jeralyn Ruths, MD        OBJECTIVE: Vitals:   06/22/23 0846  BP: (!) 143/94  Pulse: (!) 116  Resp: 16  Temp: (!) 97 F (36.1 C)  SpO2: 100%        Body mass index is 21.02 kg/m.    ECOG FS:0 - Asymptomatic  General: Well-developed, well-nourished, no acute distress. Eyes: Pink conjunctiva, anicteric sclera. HEENT: Normocephalic, moist mucous membranes. Lungs: No audible wheezing or coughing. Heart: Regular rate and rhythm. Abdomen: Soft, nontender, no obvious distention. Musculoskeletal: No edema, cyanosis, or clubbing. Neuro: Alert, answering all questions appropriately. Cranial nerves grossly intact. Skin: No rashes or petechiae noted. Psych: Normal affect.  LAB RESULTS:  Lab Results  Component Value Date    NA 138 06/22/2023   K 3.3 (L) 06/22/2023   CL 107 06/22/2023   CO2 24 06/22/2023   GLUCOSE 96 06/22/2023   BUN 24 (H) 06/22/2023   CREATININE 1.12 06/22/2023   CALCIUM 10.2 06/22/2023   PROT 7.2 06/22/2023   ALBUMIN 3.2 (L) 06/22/2023   AST 27 06/22/2023   ALT 33 06/22/2023   ALKPHOS 72 06/22/2023   BILITOT 0.8 06/22/2023   GFRNONAA >60 06/22/2023   GFRAA 85 12/22/2020    Lab Results  Component Value Date   WBC 13.4 (H) 06/22/2023   NEUTROABS 9.9 (H) 06/22/2023   HGB 13.0 06/22/2023   HCT 39.1 06/22/2023   MCV 95.6 06/22/2023   PLT 233 06/22/2023     STUDIES: No results found.  ASSESSMENT: Stage IIIa adenocarcinoma of the right upper lobe lung.  PLAN:    Stage IIIa adenocarcinoma of the right upper lobe lung: Pathology and imaging reviewed independently confirming diagnosis and stage of disease.  PET scan results from February 14, 2023 reviewed independently with no obvious evidence of metastatic disease.  MRI of the brain from February 11, 2023 also negative for metastatic disease.  Surgical resection was not an option, therefore patient proceeded with concurrent XRT along with weekly carboplatin and Taxol. This will be followed by maintenance durvalumab every 2 weeks for 1 year.  Patient has had port placement.  Patient completed concurrent XRT and chemotherapy on May 27, 2023.  Proceed with cycle 2 of maintenance durvalumab today.  Return to clinic in 2 weeks for treatment only and then in 4 weeks for further evaluation and consideration of cycle 4.  Will reimage in approximately November/December 2024.   Chest pain: Patient does not complain of this today.  Continue oxycodone as needed. Iron deficiency anemia: Resolved.  Patient's most recent hemoglobin is 13.0.  He last received IV Venofer on May 25, 2023. Gnosis: Likely reactive.  Patient reports he was taking some leftover steroids for knee pain. Weight loss: Improving.  Follow-up with dietary as  indicated. Hyponatremia: Resolved. Hypokalemia: Mild, monitor.  Patient was given dietary changes.  Patient expressed understanding and was in agreement with this plan. He also understands that He can call clinic at any time with any questions, concerns,  or complaints.    Cancer Staging  Adenocarcinoma of upper lobe of right lung [facility]) Staging form: Lung, AJCC 8th Edition - Clinical stage from 03/15/2023: Stage IIIA (cT4, cN0, cM0) - Signed by Jeralyn Ruths, MD on 03/15/2023 Stage prefix: Initial diagnosis   Jeralyn Ruths, MD   06/22/2023 1:48 PM

## 2023-06-26 ENCOUNTER — Observation Stay
Admission: EM | Admit: 2023-06-26 | Discharge: 2023-06-26 | Disposition: A | Discharge status: Home / Self Care | Payer: Medicare HMO | Attending: Internal Medicine | Admitting: Internal Medicine

## 2023-06-26 ENCOUNTER — Emergency Department: Payer: Medicare HMO

## 2023-06-26 ENCOUNTER — Other Ambulatory Visit: Payer: Self-pay

## 2023-06-26 DIAGNOSIS — Z1152 Encounter for screening for COVID-19: Secondary | ICD-10-CM | POA: Insufficient documentation

## 2023-06-26 DIAGNOSIS — J439 Emphysema, unspecified: Secondary | ICD-10-CM | POA: Diagnosis not present

## 2023-06-26 DIAGNOSIS — Z8673 Personal history of transient ischemic attack (TIA), and cerebral infarction without residual deficits: Secondary | ICD-10-CM | POA: Insufficient documentation

## 2023-06-26 DIAGNOSIS — R509 Fever, unspecified: Secondary | ICD-10-CM | POA: Diagnosis not present

## 2023-06-26 DIAGNOSIS — Z79899 Other long term (current) drug therapy: Secondary | ICD-10-CM | POA: Diagnosis not present

## 2023-06-26 DIAGNOSIS — R0602 Shortness of breath: Secondary | ICD-10-CM | POA: Diagnosis not present

## 2023-06-26 DIAGNOSIS — R918 Other nonspecific abnormal finding of lung field: Secondary | ICD-10-CM | POA: Diagnosis not present

## 2023-06-26 DIAGNOSIS — Z7982 Long term (current) use of aspirin: Secondary | ICD-10-CM | POA: Diagnosis not present

## 2023-06-26 DIAGNOSIS — J441 Chronic obstructive pulmonary disease with (acute) exacerbation: Secondary | ICD-10-CM | POA: Diagnosis not present

## 2023-06-26 DIAGNOSIS — Z8616 Personal history of COVID-19: Secondary | ICD-10-CM | POA: Insufficient documentation

## 2023-06-26 DIAGNOSIS — R062 Wheezing: Secondary | ICD-10-CM | POA: Diagnosis not present

## 2023-06-26 DIAGNOSIS — Z87891 Personal history of nicotine dependence: Secondary | ICD-10-CM | POA: Diagnosis not present

## 2023-06-26 DIAGNOSIS — R0689 Other abnormalities of breathing: Secondary | ICD-10-CM | POA: Diagnosis not present

## 2023-06-26 DIAGNOSIS — J449 Chronic obstructive pulmonary disease, unspecified: Secondary | ICD-10-CM | POA: Diagnosis present

## 2023-06-26 DIAGNOSIS — R000 Tachycardia, unspecified: Secondary | ICD-10-CM | POA: Diagnosis not present

## 2023-06-26 LAB — BASIC METABOLIC PANEL
Anion gap: 8 (ref 5–15)
BUN: 24 mg/dL — ABNORMAL HIGH (ref 8–23)
CO2: 24 mmol/L (ref 22–32)
Calcium: 9.3 mg/dL (ref 8.9–10.3)
Chloride: 104 mmol/L (ref 98–111)
Creatinine, Ser: 1.35 mg/dL — ABNORMAL HIGH (ref 0.61–1.24)
GFR, Estimated: 57 mL/min — ABNORMAL LOW (ref >60)
Glucose, Bld: 113 mg/dL — ABNORMAL HIGH (ref 70–99)
Potassium: 3.8 mmol/L (ref 3.5–5.1)
Sodium: 136 mmol/L (ref 135–145)

## 2023-06-26 LAB — CBC WITH DIFFERENTIAL/PLATELET
Abs Immature Granulocytes: 1.12 10*3/uL — ABNORMAL HIGH (ref 0.00–0.07)
Basophils Absolute: 0 10*3/uL (ref 0.0–0.1)
Basophils Relative: 0 %
Eosinophils Absolute: 0.1 10*3/uL (ref 0.0–0.5)
Eosinophils Relative: 2 %
HCT: 41.3 % (ref 39.0–52.0)
Hemoglobin: 13.1 g/dL (ref 13.0–17.0)
Immature Granulocytes: 12 %
Lymphocytes Relative: 8 %
Lymphs Abs: 0.8 10*3/uL (ref 0.7–4.0)
MCH: 31.3 pg (ref 26.0–34.0)
MCHC: 31.7 g/dL (ref 30.0–36.0)
MCV: 98.6 fL (ref 80.0–100.0)
Monocytes Absolute: 0.6 10*3/uL (ref 0.1–1.0)
Monocytes Relative: 6 %
Neutro Abs: 6.7 10*3/uL (ref 1.7–7.7)
Neutrophils Relative %: 72 %
Platelets: 196 10*3/uL (ref 150–400)
RBC: 4.19 MIL/uL — ABNORMAL LOW (ref 4.22–5.81)
RDW: 24.9 % — ABNORMAL HIGH (ref 11.5–15.5)
Smear Review: NORMAL
WBC: 9.4 10*3/uL (ref 4.0–10.5)
nRBC: 0.2 % (ref 0.0–0.2)

## 2023-06-26 LAB — TSH: TSH: 4.535 u[IU]/mL — ABNORMAL HIGH (ref 0.350–4.500)

## 2023-06-26 LAB — RESP PANEL BY RT-PCR (RSV, FLU A&B, COVID)  RVPGX2
Influenza A by PCR: NEGATIVE
Influenza B by PCR: NEGATIVE
Resp Syncytial Virus by PCR: NEGATIVE
SARS Coronavirus 2 by RT PCR: NEGATIVE

## 2023-06-26 LAB — MAGNESIUM: Magnesium: 1.7 mg/dL (ref 1.7–2.4)

## 2023-06-26 MED ORDER — PREDNISONE 10 MG PO TABS: 10.0000 mg | ORAL_TABLET | Freq: Every day | ORAL | 0 refills | Status: DC

## 2023-06-26 MED ORDER — OXYCODONE HCL 5 MG PO TABS: 10.0000 mg | ORAL_TABLET | Freq: Four times a day (QID) | ORAL | Status: DC | PRN

## 2023-06-26 MED ORDER — SUCRALFATE 1 G PO TABS: 1.0000 g | ORAL_TABLET | Freq: Three times a day (TID) | ORAL | Status: DC

## 2023-06-26 MED ORDER — ONDANSETRON HCL 4 MG PO TABS: 4.0000 mg | ORAL_TABLET | Freq: Four times a day (QID) | ORAL | Status: DC | PRN

## 2023-06-26 MED ORDER — ARFORMOTEROL TARTRATE 15 MCG/2ML IN NEBU
15.0000 ug | INHALATION_SOLUTION | Freq: Two times a day (BID) | RESPIRATORY_TRACT | Status: DC
  Filled 2023-06-26: qty 2

## 2023-06-26 MED ORDER — POLYETHYLENE GLYCOL 3350 17 G PO PACK: 17.0000 g | PACK | Freq: Two times a day (BID) | ORAL | Status: DC | PRN

## 2023-06-26 MED ORDER — METHYLPREDNISOLONE SODIUM SUCC 125 MG IJ SOLR
125.0000 mg | Freq: Once | INTRAMUSCULAR | Status: AC
Start: 2023-06-26 — End: 2023-06-26
  Administered 2023-06-26: 125 mg via INTRAVENOUS
  Filled 2023-06-26: qty 2

## 2023-06-26 MED ORDER — ACETAMINOPHEN 325 MG RE SUPP: 650.0000 mg | Freq: Four times a day (QID) | RECTAL | Status: DC | PRN

## 2023-06-26 MED ORDER — IPRATROPIUM-ALBUTEROL 0.5-2.5 (3) MG/3ML IN SOLN
3.0000 mL | Freq: Once | RESPIRATORY_TRACT | Status: AC
  Administered 2023-06-26: 3 mL via RESPIRATORY_TRACT
  Filled 2023-06-26: qty 3

## 2023-06-26 MED ORDER — ALBUTEROL SULFATE (2.5 MG/3ML) 0.083% IN NEBU: 2.5000 mg | INHALATION_SOLUTION | RESPIRATORY_TRACT | Status: DC | PRN

## 2023-06-26 MED ORDER — AZITHROMYCIN 250 MG PO TABS: ORAL_TABLET | ORAL | 0 refills | Status: AC

## 2023-06-26 MED ORDER — UMECLIDINIUM BROMIDE 62.5 MCG/ACT IN AEPB: 1.0000 | INHALATION_SPRAY | Freq: Every day | RESPIRATORY_TRACT | Status: DC

## 2023-06-26 MED ORDER — AZITHROMYCIN 250 MG PO TABS: 250.0000 mg | ORAL_TABLET | Freq: Every day | ORAL | Status: DC

## 2023-06-26 MED ORDER — MELATONIN 5 MG PO TABS: 5.0000 mg | ORAL_TABLET | Freq: Every evening | ORAL | Status: DC | PRN

## 2023-06-26 MED ORDER — ROSUVASTATIN CALCIUM 20 MG PO TABS: 40.0000 mg | ORAL_TABLET | ORAL | Status: DC

## 2023-06-26 MED ORDER — SENNA 8.6 MG PO TABS: 1.0000 | ORAL_TABLET | Freq: Two times a day (BID) | ORAL | Status: DC | PRN

## 2023-06-26 MED ORDER — AZITHROMYCIN 500 MG IV SOLR
500.0000 mg | Freq: Once | INTRAVENOUS | Status: AC
  Administered 2023-06-26: 500 mg via INTRAVENOUS
  Filled 2023-06-26: qty 5

## 2023-06-26 MED ORDER — PREDNISONE 10 MG PO TABS: 40.0000 mg | ORAL_TABLET | Freq: Every day | ORAL | 0 refills | Status: DC

## 2023-06-26 MED ORDER — IOHEXOL 350 MG/ML SOLN
75.0000 mL | Freq: Once | INTRAVENOUS | Status: AC | PRN
  Administered 2023-06-26: 75 mL via INTRAVENOUS

## 2023-06-26 MED ORDER — POTASSIUM CHLORIDE CRYS ER 20 MEQ PO TBCR
40.0000 meq | EXTENDED_RELEASE_TABLET | Freq: Once | ORAL | Status: AC
  Administered 2023-06-26: 40 meq via ORAL
  Filled 2023-06-26: qty 2

## 2023-06-26 MED ORDER — SODIUM CHLORIDE 0.9 % IV BOLUS
1000.0000 mL | Freq: Once | INTRAVENOUS | Status: AC
  Administered 2023-06-26: 1000 mL via INTRAVENOUS

## 2023-06-26 MED ORDER — MIDODRINE HCL 5 MG PO TABS
5.0000 mg | ORAL_TABLET | Freq: Three times a day (TID) | ORAL | Status: DC
Start: 2023-06-26 — End: 2023-06-26

## 2023-06-26 MED ORDER — ACETAMINOPHEN 325 MG PO TABS: 650.0000 mg | ORAL_TABLET | Freq: Four times a day (QID) | ORAL | Status: DC | PRN

## 2023-06-26 MED ORDER — FLUTICASONE PROPIONATE 50 MCG/ACT NA SUSP: 2.0000 | Freq: Every day | NASAL | Status: DC | PRN

## 2023-06-26 MED ORDER — IPRATROPIUM-ALBUTEROL 0.5-2.5 (3) MG/3ML IN SOLN
3.0000 mL | Freq: Four times a day (QID) | RESPIRATORY_TRACT | Status: DC
  Administered 2023-06-26: 3 mL via RESPIRATORY_TRACT
  Filled 2023-06-26: qty 3

## 2023-06-26 MED ORDER — ENOXAPARIN SODIUM 40 MG/0.4ML IJ SOSY
40.0000 mg | PREFILLED_SYRINGE | INTRAMUSCULAR | Status: DC
  Administered 2023-06-26: 40 mg via SUBCUTANEOUS
  Filled 2023-06-26: qty 0.4

## 2023-06-26 MED ORDER — ASPIRIN 325 MG PO TBEC
325.0000 mg | DELAYED_RELEASE_TABLET | Freq: Every day | ORAL | Status: DC
  Administered 2023-06-26: 325 mg via ORAL
  Filled 2023-06-26: qty 1

## 2023-06-26 MED ORDER — ONDANSETRON HCL 4 MG/2ML IJ SOLN: 4.0000 mg | Freq: Four times a day (QID) | INTRAMUSCULAR | Status: DC | PRN

## 2023-06-26 MED ORDER — PREDNISONE 20 MG PO TABS: 40.0000 mg | ORAL_TABLET | Freq: Every day | ORAL | Status: DC

## 2023-06-26 MED ORDER — METHYLPREDNISOLONE SODIUM SUCC 40 MG IJ SOLR: 40.0000 mg | Freq: Two times a day (BID) | INTRAMUSCULAR | Status: DC

## 2023-06-26 NOTE — ED Provider Notes (Signed)
[facility] Provider Note    Event Date/Time   First MD Initiated Contact with Patient 06/26/23 [phone]     (approximate)   History   Respiratory Distress   HPI  Ethan Fitzgerald is a 67 y.o. male who presents to the emergency department today because of concerns for shortness of breath.  Patient started developing some shortness of breath a couple of days ago.  It is progressively gotten worse.  Has been accompanied by a slightly productive cough.  No significant associated chest pain.  430 this morning the patient had significant worsening of the shortness of breath.  Was given DuoNeb treatments by EMS which slightly helped his symptoms.  Patient denies any fevers although has felt flushed. Grandson who lives in house has had cough recently.     Physical Exam   Triage Vital Signs: ED Triage Vitals  Encounter Vitals Group     BP 06/26/23 0712 98/73     Systolic BP Percentile --      Diastolic BP Percentile --      Pulse Rate 06/26/23 0712 (!) 138     Resp 06/26/23 0712 (!) 22     Temp 06/26/23 0712 99.2 F (37.3 C)     Temp Source 06/26/23 0712 Oral     SpO2 06/26/23 0712 97 %     Weight --      Height 06/26/23 0706 6' (1.829 m)     Head Circumference --      Peak Flow --      Pain Score 06/26/23 0706 0     Pain Loc --      Pain Education --      Exclude from Growth Chart --     Most recent vital signs: Vitals:   06/26/23 0712  BP: 98/73  Pulse: (!) 138  Resp: (!) 22  Temp: 99.2 F (37.3 C)  SpO2: 97%   General: Awake, alert, oriented. CV:  Good peripheral perfusion. Tachycarida. Resp:  Increased work of breathing, tachypnea. Poor air movement diffusely Abd:  No distention.    ED Results / Procedures / Treatments   Labs (all labs ordered are listed, but only abnormal results are displayed) Labs Reviewed - No data to display   EKG  I, Phineas Semen, attending physician, personally viewed and interpreted this EKG  EKG Time:  0720 Rate: 145 Rhythm: sinus tachycardia Axis: normal Intervals: qtc 432 QRS: narrow ST changes: no st elevation Impression: abnormal ekg    RADIOLOGY I independently interpreted and visualized the CXR. My interpretation: No pneumonia Radiology interpretation:  IMPRESSION:  1. No acute finding.  2. Emphysema and known right pulmonary mass.   I independently interpreted and visualized the CT angio PE. My interpretation: No large PE Radiology interpretation: IMPRESSION:  1. No evidence of pulmonary embolism to the proximal subsegmental  pulmonary artery level.  2. Redemonstration of a 5.6 x 6.1 cm mass in the posterior segment  of the right lung upper lobe extending into the superior segment of  the right lower lobe. No significant interval change since the prior  study.  3. Multiple other nonacute observations, as described above. No  metastatic disease identified in the chest.    PROCEDURES:  Critical Care performed: Yes  CRITICAL CARE Performed by: Phineas Semen   Total critical care time: No minutes  Critical care time was exclusive of separately billable procedures and treating other patients.  Critical care was necessary to treat or prevent imminent or  life-threatening deterioration.  Critical care was time spent personally by me on the following activities: development of treatment plan with patient and/or surrogate as well as nursing, discussions with consultants, evaluation of patient's response to treatment, examination of patient, obtaining history from patient or surrogate, ordering and performing treatments and interventions, ordering and review of laboratory studies, ordering and review of radiographic studies, pulse oximetry and re-evaluation of patient's condition.   Procedures    MEDICATIONS ORDERED IN ED: Medications - No data to display   IMPRESSION / MDM / ASSESSMENT AND PLAN / ED COURSE  I reviewed the triage vital signs and the nursing  notes.                              Differential diagnosis includes, but is not limited to, pneumonia, COVID, PE, COPD  Patient's presentation is most consistent with acute presentation with potential threat to life or bodily function.   The patient is on the cardiac monitor to evaluate for evidence of arrhythmia and/or significant heart rate changes.  Patient presents to the emergency department today because of concerns for significant shortness of breath.  On exam patient does have increased work of breathing and significant tachycardia.  Some of this might be driven by recent DuoNeb treatments.  Chest x-ray without obvious pneumonia.  Would consider viral illness given family member has had a cough recently.  Additionally would have concerns for PE given history of cancer. Will obtain CT angio.  CT angio without findings concerning for PE or pneumonia. Patients breathing has improved but continues to have increased work of breathing. At this time will plan on admission for further work up and management of COPD. Discussed with Dr. Chipper Herb with the hospitalist service.       FINAL CLINICAL IMPRESSION(S) / ED DIAGNOSES   Final diagnoses:  COPD exacerbation (HCC)     Note:  This document was prepared using Dragon voice recognition software and may include unintentional dictation errors.    Phineas Semen, MD 06/26/23 [phone]

## 2023-06-26 NOTE — H&P (Signed)
History and Physical    Ethan Fitzgerald NWG:[id number] DOB: 1955-11-13 DOA: 06/26/2023  PCP: Smitty Cords, DO (Confirm with patient/family/NH records and if not entered, this has to be entered at [facility] point of entry) Patient coming from: Home  I have personally briefly reviewed patient's old medical records in [facility]  Chief Complaint: Cough, wheezing, shortness of breath  HPI: Ethan Fitzgerald is a 67 y.o. male with medical history significant of stage III right upper lung adenocarcinoma on chemotherapy and status post radiation therapy, COPD Gold stage II, orthostatic hypotension, presented with worsening of cough wheezing shortness of breath.  Symptoms started 2 to 3 days ago, gradual getting worse with increasing dry cough wheezing and shortness of breath.  Denies any chest pain no fever or chills.  ED Course: Tachypnea and tachycardia afebrile, blood pressure 90/70.  Chest x-ray negative for acute findings CTA negative for PE and redemonstration of right upper lobe mass.  And patient was given multiple rounds of breathing medication and Solu-Medrol remained tachypneic and tachycardia.  Blood work showed WBC 9.4, hemoglobin 13, K3.8, creatinine 1.3 bicarb 24  Review of Systems: As per HPI otherwise 14 point review of systems negative.    Past Medical History:  Diagnosis Date   COPD (chronic obstructive pulmonary disease) (HCC)    mild   COVID-19 09/2019   Depression    Erectile dysfunction    Headache    daily, stress headaches   Knee pain, left    Seasonal allergies    Stroke [facility])    Tobacco dependence     Past Surgical History:  Procedure Laterality Date   BACK SURGERY     metal plate in back   COLONOSCOPY     COLONOSCOPY WITH PROPOFOL N/A 04/26/2019   Procedure: COLONOSCOPY WITH PROPOFOL;  Surgeon: Wyline Mood, MD;  Location: [facility] ENDOSCOPY;  Service: Gastroenterology;  Laterality: N/A;   FINE NEEDLE ASPIRATION  03/03/2023   Procedure: FINE NEEDLE  ASPIRATION;  Surgeon: Raechel Chute, MD;  Location: [facility] ENDOSCOPY;  Service: Pulmonary;;   HERNIA REPAIR Right    IR ANGIO INTRA EXTRACRAN SEL COM CAROTID INNOMINATE UNI L MOD SED  03/27/2020   IR ANGIO VERTEBRAL SEL SUBCLAVIAN INNOMINATE UNI L MOD SED  03/27/2020   IR ANGIO VERTEBRAL SEL VERTEBRAL UNI R MOD SED  03/27/2020   IR CT HEAD LTD  03/27/2020   IR IMAGING GUIDED PORT INSERTION  03/29/2023   IR PERCUTANEOUS ART THROMBECTOMY/INFUSION INTRACRANIAL INC DIAG ANGIO  03/27/2020   KNEE ARTHROSCOPY Left 03/28/2015   Procedure: Arthroscopic partial medial meniscectomy plus chondral debridement;  Surgeon: Erin Sons, MD;  Location: [facility];  Service: Orthopedics;  Laterality: Left;   RADIOLOGY WITH ANESTHESIA N/A 03/27/2020   Procedure: IR WITH ANESTHESIA;  Surgeon: Julieanne Cotton, MD;  Location: [facility] OR;  Service: Radiology;  Laterality: N/A;   VIDEO BRONCHOSCOPY WITH ENDOBRONCHIAL ULTRASOUND  03/03/2023   Procedure: VIDEO BRONCHOSCOPY WITH ENDOBRONCHIAL ULTRASOUND;  Surgeon: Raechel Chute, MD;  Location: [facility] ENDOSCOPY;  Service: Pulmonary;;     reports that he quit smoking about 5 years ago. His smoking use included cigarettes. He started smoking about 54 years ago. He has a 73.5 pack-year smoking history. He has quit using smokeless tobacco. He reports that he does not currently use alcohol. He reports that he does not use drugs.  No Known Allergies  Family History  Problem Relation Age of Onset   Diabetes Mother    Heart attack Mother 2  Cancer Father        liver    COPD Brother    HIV/AIDS Brother    Prostate cancer Neg Hx    Colon cancer Neg Hx      Prior to Admission medications   Medication Sig Start Date End Date Taking? Authorizing Provider  albuterol (VENTOLIN HFA) 108 (90 Base) MCG/ACT inhaler Inhale 2 puffs into the lungs every 4 (four) hours as needed for wheezing or shortness of breath. 04/15/23   Karamalegos, Netta Neat, DO  aspirin EC 325 MG EC  tablet Take 1 tablet (325 mg total) by mouth daily. 04/09/20   Love, Evlyn Kanner, PA-C  b complex vitamins capsule Take 1 capsule by mouth daily.    [provider]  Cholecalciferol (VITAMIN D) 50 MCG (2000 UT) tablet Take 2,000 Units by mouth daily.    [provider]  dexamethasone (DECADRON) 4 MG tablet TAKE 1 TABLET EVERY DAY 06/17/23   Jeralyn Ruths, MD  fludrocortisone (FLORINEF) 0.1 MG tablet Take 0.1 mg by mouth daily.    [provider]  fluticasone (FLONASE) 50 MCG/ACT nasal spray Place 2 sprays into both nostrils daily. Patient taking differently: Place 2 sprays into both nostrils daily as needed for allergies. 12/29/22   Karamalegos, Alexander J, DO  melatonin 5 MG TABS Take 5 mg by mouth at bedtime as needed (sleep).    [provider]  midodrine (PROAMATINE) 5 MG tablet Take 10 mg by mouth 3 (three) times daily with meals.    [provider]  Multiple Vitamin (MULTIVITAMIN WITH MINERALS) TABS tablet Take 1 tablet by mouth daily.    [provider]  Multiple Vitamins-Minerals (IMMUNE SUPPORT VITAMIN C PO) Take 1 tablet by mouth daily.    [provider]  naproxen (NAPROSYN) 500 MG tablet Take 1 tablet (500 mg total) by mouth 2 (two) times daily with a meal. 09/24/22   Poggi, Eileen Stanford E, PA-C  OVER THE COUNTER MEDICATION Take 5 mLs by mouth every other day. Mary Ruth's vegan liquid iron    [provider]  Oxycodone HCl 10 MG TABS Take 1 tablet (10 mg total) by mouth every 6 (six) hours as needed. Take with stool softeners to avoid constipation 04/12/23   Alinda Dooms, NP  polyethylene glycol (MIRALAX) 17 g packet Take 17 g by mouth 2 (two) times daily as needed for mild constipation or moderate constipation (Hold if diarrhea.). 04/12/23   Alinda Dooms, NP  rosuvastatin (CRESTOR) 40 MG tablet Take 1 tablet (40 mg total) by mouth every other day. 12/29/22   Karamalegos, Netta Neat, DO  senna (SENOKOT) 8.6 MG TABS tablet  Take 1-2 tablets (8.6-17.2 mg total) by mouth 2 (two) times daily as needed for mild constipation or moderate constipation (Hold if diarrhea). 04/12/23   Alinda Dooms, NP  sildenafil (REVATIO) 20 MG tablet TAKE 1 TO 5 TABLETS BY MOUTH ABOUT 30 MINUTES PRIOR TO SEX.START WITH 1 THEN INCREASE 03/10/22   Karamalegos, Alexander J, DO  STIOLTO RESPIMAT 2.5-2.5 MCG/ACT AERS INHALE 2 PUFFS INTO THE LUNGS DAILY. 08/03/22   Karamalegos, Netta Neat, DO  sucralfate (CARAFATE) 1 g tablet Take 1 tablet (1 g total) by mouth 3 (three) times daily before meals. 05/04/23   Carmina Miller, MD    Physical Exam: Vitals:   06/26/23 0712 06/26/23 0800 06/26/23 0831 06/26/23 0900  BP: 98/73 114/71 123/69 110/70  Pulse: (!) 138 (!) 142 (!) 133 (!) 133  Resp: (!) 22  Marland Kitchen)  24   Temp: 99.2 F (37.3 C)     TempSrc: Oral     SpO2: 97%  96%   Height:        Constitutional: NAD, calm, comfortable Vitals:   06/26/23 0712 06/26/23 0800 06/26/23 0831 06/26/23 0900  BP: 98/73 114/71 123/69 110/70  Pulse: (!) 138 (!) 142 (!) 133 (!) 133  Resp: (!) 22  (!) 24   Temp: 99.2 F (37.3 C)     TempSrc: Oral     SpO2: 97%  96%   Height:       Eyes: PERRL, lids and conjunctivae normal ENMT: Mucous membranes are dry. Posterior pharynx clear of any exudate or lesions.Normal dentition.  Neck: normal, supple, no masses, no thyromegaly Respiratory: Diminished breathing sound bilaterally, scattered wheezing, no crackles, increasing respiratory effort. No accessory muscle use.  Cardiovascular: Regular rate and rhythm, no murmurs / rubs / gallops. No extremity edema. 2+ pedal pulses. No carotid bruits.  Abdomen: no tenderness, no masses palpated. No hepatosplenomegaly. Bowel sounds positive.  Musculoskeletal: no clubbing / cyanosis. No joint deformity upper and lower extremities. Good ROM, no contractures. Normal muscle tone.  Skin: no rashes, lesions, ulcers. No induration Neurologic: CN 2-12 grossly intact. Sensation intact, DTR  normal. Strength 5/5 in all 4.  Psychiatric: Normal judgment and insight. Alert and oriented x 3. Normal mood.     Labs on Admission: I have personally reviewed following labs and imaging studies  CBC: Recent Labs  Lab 06/22/23 0839 06/26/23 0721  WBC 13.4* 9.4  NEUTROABS 9.9* 6.7  HGB 13.0 13.1  HCT 39.1 41.3  MCV 95.6 98.6  PLT 233 196   Basic Metabolic Panel: Recent Labs  Lab 06/22/23 0839 06/26/23 0721  NA 138 136  K 3.3* 3.8  CL 107 104  CO2 24 24  GLUCOSE 96 113*  BUN 24* 24*  CREATININE 1.12 1.35*  CALCIUM 10.2 9.3   GFR: Estimated Creatinine Clearance: 52.1 mL/min (A) (by C-G formula based on SCr of 1.35 mg/dL (H)). Liver Function Tests: Recent Labs  Lab 06/22/23 0839  AST 27  ALT 33  ALKPHOS 72  BILITOT 0.8  PROT 7.2  ALBUMIN 3.2*   No results for input(s): "LIPASE", "AMYLASE" in the last 168 hours. No results for input(s): "AMMONIA" in the last 168 hours. Coagulation Profile: No results for input(s): "INR", "PROTIME" in the last 168 hours. Cardiac Enzymes: No results for input(s): "CKTOTAL", "CKMB", "CKMBINDEX", "TROPONINI" in the last 168 hours. BNP (last 3 results) No results for input(s): "PROBNP" in the last 8760 hours. HbA1C: No results for input(s): "HGBA1C" in the last 72 hours. CBG: No results for input(s): "GLUCAP" in the last 168 hours. Lipid Profile: No results for input(s): "CHOL", "HDL", "LDLCALC", "TRIG", "CHOLHDL", "LDLDIRECT" in the last 72 hours. Thyroid Function Tests: No results for input(s): "TSH", "T4TOTAL", "FREET4", "T3FREE", "THYROIDAB" in the last 72 hours. Anemia Panel: No results for input(s): "VITAMINB12", "FOLATE", "FERRITIN", "TIBC", "IRON", "RETICCTPCT" in the last 72 hours. Urine analysis:    Component Value Date/Time   COLORURINE STRAW (A) 02/05/2023 1019   APPEARANCEUR HAZY (A) 02/05/2023 1019   LABSPEC 1.034 (H) 02/05/2023 1019   PHURINE 7.0 02/05/2023 1019   GLUCOSEU NEGATIVE 02/05/2023 1019   HGBUR  NEGATIVE 02/05/2023 1019   BILIRUBINUR NEGATIVE 02/05/2023 1019   KETONESUR NEGATIVE 02/05/2023 1019   PROTEINUR NEGATIVE 02/05/2023 1019   NITRITE NEGATIVE 02/05/2023 1019   LEUKOCYTESUR NEGATIVE 02/05/2023 1019    Radiological Exams on Admission: CT Angio Chest  PE W and/or Wo Contrast  Result Date: 06/26/2023 CLINICAL DATA:  Shortness of breath. History of COPD and lung carcinoma. * Tracking Code: BO * EXAM: CT ANGIOGRAPHY CHEST WITH CONTRAST TECHNIQUE: Multidetector CT imaging of the chest was performed using the standard protocol during bolus administration of intravenous contrast. Multiplanar CT image reconstructions and MIPs were obtained to evaluate the vascular anatomy. RADIATION DOSE REDUCTION: This exam was performed according to the departmental dose-optimization program which includes automated exposure control, adjustment of the mA and/or kV according to patient size and/or use of iterative reconstruction technique. CONTRAST:  75mL OMNIPAQUE IOHEXOL 350 MG/ML SOLN COMPARISON:  CT scan chest from 03/02/2023. FINDINGS: Cardiovascular: No evidence of embolism to the proximal subsegmental pulmonary artery level. Normal cardiac size. No pericardial effusion. No aortic aneurysm. Mediastinum/Nodes: Visualized thyroid gland appears grossly unremarkable. No solid / cystic mediastinal masses. The esophagus is nondistended precluding optimal assessment. No axillary, mediastinal or hilar lymphadenopathy by size criteria. Lungs/Pleura: The central tracheo-bronchial tree is patent. There is mild-to-moderate upper lobe predominant centrilobular emphysematous changes. There are patchy areas of linear, plate-like atelectasis and/or scarring throughout bilateral lungs. Redemonstration of a 5.6 x 6.1 cm mass in the posterior segment of right upper lobe. The mass extends into the superior segment of right lower lobe by crossing the major fissure. It abuts the thoracic vertebrae and posterior ribs without  erosions. No significant interval change since the prior study. No consolidation, pleural effusion or pneumothorax. No suspicious lung nodules. Upper Abdomen: There are moderate volume noncalcified gallstones without imaging signs of acute cholecystitis. There is a 4 mm nonobstructing calculus in the left kidney lower pole. Remaining visualized upper abdominal viscera within normal limits. Musculoskeletal: The visualized soft tissues of the chest wall are grossly unremarkable. No suspicious osseous lesions. There are mild multilevel degenerative changes in the visualized spine. Review of the MIP images confirms the above findings. IMPRESSION: 1. No evidence of pulmonary embolism to the proximal subsegmental pulmonary artery level. 2. Redemonstration of a 5.6 x 6.1 cm mass in the posterior segment of the right lung upper lobe extending into the superior segment of the right lower lobe. No significant interval change since the prior study. 3. Multiple other nonacute observations, as described above. No metastatic disease identified in the chest. Emphysema (ICD10-J43.9). Electronically Signed   By: Jules Schick M.D.   On: 06/26/2023 08:39   DG Chest Portable 1 View  Result Date: 06/26/2023 CLINICAL DATA:  Shortness of breath EXAM: PORTABLE CHEST 1 VIEW COMPARISON:  02/05/2023 FINDINGS: Known paramediastinal lung mass which is less pronounced than before. Normal heart size and stable aortic contours. Porta catheter on the left with tip at the upper right atrium. Emphysema. There is no edema, consolidation, effusion, or pneumothorax. IMPRESSION: 1. No acute finding. 2. Emphysema and known right pulmonary mass. Electronically Signed   By: Tiburcio Pea M.D.   On: 06/26/2023 07:41    EKG: Independently reviewed.  Sinus tachycardia, no acute ST changes.  Assessment/Plan Principal Problem:   COPD (chronic obstructive pulmonary disease) (HCC) Active Problems:   COPD with acute exacerbation (HCC)  (please  populate well all problems here in Problem List. (For example, if patient is on BP meds at home and you resume or decide to hold them, it is a problem that needs to be her. Same for CAD, COPD, HLD and so on)  Acute COPD exacerbation -With significant increase of breathing effort despite ED management of multiple rounds of DuoNebs and IV Solu-Medrol, decided to keep  patient in hospital observation overnight to treat his COPD. -IV Solu-Medrol x 2 bridging for p.o. steroid -DuoNebs, continue LABA and ICS -As needed albuterol -Incentive spirometry and flutter valve  Sinus tachycardia -Clinically appears to be volume depleted, 1 L IV bolus ordered -PE ruled out by CTA  Orthostatic hypotension -Blood pressure borderline low probably from volume contraction, IV fluid as above -Continue midodrine  RUL stage III lung adenocarcinoma -Chemotherapy every 2 weeks and outpatient follow-up with oncology -Status post radiation therapy  DVT prophylaxis: Lovenox Code Status: Full code Family Communication: Wife at bedside Disposition Plan: Expect less than 2 midnight hospital stay Consults called: None Admission status: Telemetry observation   Emeline General MD [facility] Pager [phone] 06/26/2023, 10:10 AM

## 2023-06-26 NOTE — ED Triage Notes (Signed)
Pt arrives via [facility] from home for respiratory distress. Pt used rescue inhaler without relief. Pt with hx of COPD and lung CA. Pt arrives on aerosol mask. Pt given 2 duonebs en route with EMS. Pt axox4 with mild tachypnea.

## 2023-06-26 NOTE — Discharge Summary (Signed)
Physician Discharge Summary   Patient: Ethan Fitzgerald MRN: [id number] DOB: 1955-10-15  Admit date:     06/26/2023  Discharge date: 06/26/23  Discharge Physician: Emeline General   PCP: Smitty Cords, DO   Recommendations at discharge:   Follow-up with PCP in 1 to 2 weeks If ever develop increasing shortness of breath high fever palpitations chest pain please come to ED immediately. Discharge Diagnoses: Principal Problem:   COPD (chronic obstructive pulmonary disease) (HCC) Active Problems:   COPD with acute exacerbation [facility])    Hospital Course:  Patient with history of lung cancer COPD Gold stage II presented with worsening of cough shortness of breath was found to be in COPD exacerbation.  CTA negative for PE.  And patient was placed on bicarb and multiple rounds of IV Solu-Medrol and DuoNebs given.  Symptoms significantly improved in the afternoon off BiPAP and breathing on room air without any significant distress.  As per patient's preference, will discharge home, 5 days worth of prednisone 40 mg daily as well as Z-Pak sent to [facility].      Pain control - [employer] Controlled Substance Reporting System database was reviewed. and patient was instructed, not to drive, operate heavy machinery, perform activities at heights, swimming or participation in water activities or provide baby-sitting services while on Pain, Sleep and Anxiety Medications; until their outpatient Physician has advised to do so again. Also recommended to not to take more than prescribed Pain, Sleep and Anxiety Medications.  Consultants: None Procedures performed: None Disposition: Home Diet recommendation:  Discharge Diet Orders (From admission, onward)     Start     Ordered   06/26/23 0000  Diet - low sodium heart healthy        06/26/23 1639           Cardiac diet DISCHARGE MEDICATION:   Discharge Exam: There were no vitals filed for this visit. Eyes: PERRL, lids and  conjunctivae normal ENMT: Mucous membranes are moist. Posterior pharynx clear of any exudate or lesions.Normal dentition.  Neck: normal, supple, no masses, no thyromegaly Respiratory: clear to auscultation bilaterally, no wheezing, no crackles. Normal respiratory effort. No accessory muscle use.  Cardiovascular: Regular rate and rhythm, no murmurs / rubs / gallops. No extremity edema. 2+ pedal pulses. No carotid bruits.  Abdomen: no tenderness, no masses palpated. No hepatosplenomegaly. Bowel sounds positive.  Musculoskeletal: no clubbing / cyanosis. No joint deformity upper and lower extremities. Good ROM, no contractures. Normal muscle tone.  Skin: no rashes, lesions, ulcers. No induration Neurologic: CN 2-12 grossly intact. Sensation intact, DTR normal.  Muscle strength 5/5 on both sides Psychiatric: Normal judgment and insight. Alert and oriented x 3. Normal mood.     Condition at discharge: fair  The results of significant diagnostics from this hospitalization (including imaging, microbiology, ancillary and laboratory) are listed below for reference.   Imaging Studies: CT Angio Chest PE W and/or Wo Contrast  Result Date: 06/26/2023 CLINICAL DATA:  Shortness of breath. History of COPD and lung carcinoma. * Tracking Code: BO * EXAM: CT ANGIOGRAPHY CHEST WITH CONTRAST TECHNIQUE: Multidetector CT imaging of the chest was performed using the standard protocol during bolus administration of intravenous contrast. Multiplanar CT image reconstructions and MIPs were obtained to evaluate the vascular anatomy. RADIATION DOSE REDUCTION: This exam was performed according to the departmental dose-optimization program which includes automated exposure control, adjustment of the mA and/or kV according to patient size and/or use of iterative reconstruction technique. CONTRAST:  75mL  OMNIPAQUE IOHEXOL 350 MG/ML SOLN COMPARISON:  CT scan chest from 03/02/2023. FINDINGS: Cardiovascular: No evidence of embolism to  the proximal subsegmental pulmonary artery level. Normal cardiac size. No pericardial effusion. No aortic aneurysm. Mediastinum/Nodes: Visualized thyroid gland appears grossly unremarkable. No solid / cystic mediastinal masses. The esophagus is nondistended precluding optimal assessment. No axillary, mediastinal or hilar lymphadenopathy by size criteria. Lungs/Pleura: The central tracheo-bronchial tree is patent. There is mild-to-moderate upper lobe predominant centrilobular emphysematous changes. There are patchy areas of linear, plate-like atelectasis and/or scarring throughout bilateral lungs. Redemonstration of a 5.6 x 6.1 cm mass in the posterior segment of right upper lobe. The mass extends into the superior segment of right lower lobe by crossing the major fissure. It abuts the thoracic vertebrae and posterior ribs without erosions. No significant interval change since the prior study. No consolidation, pleural effusion or pneumothorax. No suspicious lung nodules. Upper Abdomen: There are moderate volume noncalcified gallstones without imaging signs of acute cholecystitis. There is a 4 mm nonobstructing calculus in the left kidney lower pole. Remaining visualized upper abdominal viscera within normal limits. Musculoskeletal: The visualized soft tissues of the chest wall are grossly unremarkable. No suspicious osseous lesions. There are mild multilevel degenerative changes in the visualized spine. Review of the MIP images confirms the above findings. IMPRESSION: 1. No evidence of pulmonary embolism to the proximal subsegmental pulmonary artery level. 2. Redemonstration of a 5.6 x 6.1 cm mass in the posterior segment of the right lung upper lobe extending into the superior segment of the right lower lobe. No significant interval change since the prior study. 3. Multiple other nonacute observations, as described above. No metastatic disease identified in the chest. Emphysema (ICD10-J43.9). Electronically Signed    By: Jules Schick M.D.   On: 06/26/2023 08:39   DG Chest Portable 1 View  Result Date: 06/26/2023 CLINICAL DATA:  Shortness of breath EXAM: PORTABLE CHEST 1 VIEW COMPARISON:  02/05/2023 FINDINGS: Known paramediastinal lung mass which is less pronounced than before. Normal heart size and stable aortic contours. Porta catheter on the left with tip at the upper right atrium. Emphysema. There is no edema, consolidation, effusion, or pneumothorax. IMPRESSION: 1. No acute finding. 2. Emphysema and known right pulmonary mass. Electronically Signed   By: Tiburcio Pea M.D.   On: 06/26/2023 07:41    Microbiology: Results for orders placed or performed during the hospital encounter of 06/26/23  Resp panel by RT-PCR (RSV, Flu A&B, Covid) Anterior Nasal Swab     Status: None   Collection Time: 06/26/23  8:07 AM   Specimen: Anterior Nasal Swab  Result Value Ref Range Status   SARS Coronavirus 2 by RT PCR NEGATIVE NEGATIVE Final    Comment: (NOTE) SARS-CoV-2 target nucleic acids are NOT DETECTED.  The SARS-CoV-2 RNA is generally detectable in upper respiratory specimens during the acute phase of infection. The lowest concentration of SARS-CoV-2 viral copies this assay can detect is 138 copies/mL. A negative result does not preclude SARS-Cov-2 infection and should not be used as the sole basis for treatment or other patient management decisions. A negative result may occur with  improper specimen collection/handling, submission of specimen other than nasopharyngeal swab, presence of viral mutation(s) within the areas targeted by this assay, and inadequate number of viral copies(<138 copies/mL). A negative result must be combined with clinical observations, patient history, and epidemiological information. The expected result is Negative.  Fact Sheet for Patients:  BloggerCourse.com  Fact Sheet for Healthcare Providers:  SeriousBroker.it  This  test is no t yet approved or cleared by the [place] and  has been authorized for detection and/or diagnosis of SARS-CoV-2 by FDA under an Emergency Use Authorization (EUA). This EUA will remain  in effect (meaning this test can be used) for the duration of the COVID-19 declaration under Section 564(b)(1) of the Act, 21 U.S.C.section 360bbb-3(b)(1), unless the authorization is terminated  or revoked sooner.       Influenza A by PCR NEGATIVE NEGATIVE Final   Influenza B by PCR NEGATIVE NEGATIVE Final    Comment: (NOTE) The Xpert Xpress SARS-CoV-2/FLU/RSV plus assay is intended as an aid in the diagnosis of influenza from Nasopharyngeal swab specimens and should not be used as a sole basis for treatment. Nasal washings and aspirates are unacceptable for Xpert Xpress SARS-CoV-2/FLU/RSV testing.  Fact Sheet for Patients: BloggerCourse.com  Fact Sheet for Healthcare Providers: SeriousBroker.it  This test is not yet approved or cleared by the [place] FDA and has been authorized for detection and/or diagnosis of SARS-CoV-2 by FDA under an Emergency Use Authorization (EUA). This EUA will remain in effect (meaning this test can be used) for the duration of the COVID-19 declaration under Section 564(b)(1) of the Act, 21 U.S.C. section 360bbb-3(b)(1), unless the authorization is terminated or revoked.     Resp Syncytial Virus by PCR NEGATIVE NEGATIVE Final    Comment: (NOTE) Fact Sheet for Patients: BloggerCourse.com  Fact Sheet for Healthcare Providers: SeriousBroker.it  This test is not yet approved or cleared by the [place] FDA and has been authorized for detection and/or diagnosis of SARS-CoV-2 by FDA under an Emergency Use Authorization (EUA). This EUA will remain in effect (meaning this test can be used) for the duration of the COVID-19 declaration under  Section 564(b)(1) of the Act, 21 U.S.C. section 360bbb-3(b)(1), unless the authorization is terminated or revoked.  Performed at [facility], [address] [place], [place] [address]     Labs: CBC: Recent Labs  Lab 06/22/23 0839 06/26/23 0721  WBC 13.4* 9.4  NEUTROABS 9.9* 6.7  HGB 13.0 13.1  HCT 39.1 41.3  MCV 95.6 98.6  PLT 233 196   Basic Metabolic Panel: Recent Labs  Lab 06/22/23 0839 06/26/23 0721  NA 138 136  K 3.3* 3.8  CL 107 104  CO2 24 24  GLUCOSE 96 113*  BUN 24* 24*  CREATININE 1.12 1.35*  CALCIUM 10.2 9.3  MG  --  1.7   Liver Function Tests: Recent Labs  Lab 06/22/23 0839  AST 27  ALT 33  ALKPHOS 72  BILITOT 0.8  PROT 7.2  ALBUMIN 3.2*   CBG: No results for input(s): "GLUCAP" in the last 168 hours.  Discharge time spent: less than 30 minutes.  Signed: Emeline General, MD [facility] 06/26/2023

## 2023-06-27 ENCOUNTER — Other Ambulatory Visit: Payer: Self-pay | Admitting: Family Medicine

## 2023-06-27 DIAGNOSIS — J432 Centrilobular emphysema: Secondary | ICD-10-CM

## 2023-06-28 NOTE — Telephone Encounter (Signed)
Requested medication (s) are due for refill today: yes  Requested medication (s) are on the active medication list: yes  Last refill:  08/03/22 12 grams 3 RF  Future visit scheduled: no  Notes to clinic:  med not assigned to a protocol   Requested Prescriptions  Pending Prescriptions Disp Refills   STIOLTO RESPIMAT 2.5-2.5 MCG/ACT AERS [Pharmacy Med Name: Stiolto Respimat Inhalation Aerosol Solution 2.5-2.5 MCG/ACT] 12 g 3    Sig: INHALE 2 PUFFS EVERY DAY     Off-Protocol Failed - 06/27/2023  2:18 AM      Failed - Medication not assigned to a protocol, review manually.      Passed - Valid encounter within last 12 months    Recent Outpatient Visits           5 months ago Right-sided chest wall pain   [facility] [place], Salvadore Oxford, NP   6 months ago Annual physical exam   [facility] Smitty Cords, DO   8 months ago Orthostatic hypotension   [facility] Smitty Cords, DO   9 months ago Acute right-sided low back pain with right-sided sciatica   [facility] Lorre Munroe, NP   11 months ago Centrilobular emphysema [facility])   [facility] Delles, Gentry Fitz A, RPH-CPP

## 2023-06-30 ENCOUNTER — Other Ambulatory Visit: Payer: Medicare HMO

## 2023-07-04 ENCOUNTER — Ambulatory Visit: Payer: Medicare HMO | Admitting: Radiation Oncology

## 2023-07-05 ENCOUNTER — Telehealth: Payer: Self-pay | Admitting: *Deleted

## 2023-07-05 NOTE — Telephone Encounter (Signed)
CAll returned to Ethan Fitzgerald and advised of doctor response.She said that she will keep and eye on him and see how it goes.She agrees to take to ER of breathing becomes labored and mentioned that he had to go to ER the other night not able to breath and per ER he had a COPD exacerbation

## 2023-07-05 NOTE — Telephone Encounter (Signed)
Wife called reporting that patient has had face swelling for 2 days better now The cheeks seem to be full and red today. There is no swelling on the neck or chest and the blood vessels in his chest are NOT engorged nor protruding. He did just complete a month of Prednisone 2 -3 days ago. He offers no other complaints. Please advise if anything needs to be done.

## 2023-07-06 ENCOUNTER — Ambulatory Visit: Payer: Medicare HMO

## 2023-07-06 ENCOUNTER — Ambulatory Visit: Payer: Medicare HMO | Admitting: Oncology

## 2023-07-06 ENCOUNTER — Other Ambulatory Visit: Payer: Medicare HMO

## 2023-07-06 ENCOUNTER — Ambulatory Visit: Payer: Medicare HMO | Admitting: Family Medicine

## 2023-07-11 ENCOUNTER — Other Ambulatory Visit: Payer: Self-pay | Admitting: Oncology

## 2023-07-12 ENCOUNTER — Encounter: Payer: Self-pay | Admitting: Oncology

## 2023-07-12 ENCOUNTER — Ambulatory Visit: Payer: Medicare HMO | Admitting: Family Medicine

## 2023-07-13 ENCOUNTER — Ambulatory Visit: Payer: Medicare HMO | Admitting: Nurse Practitioner

## 2023-07-13 ENCOUNTER — Inpatient Hospital Stay: Payer: Medicare HMO | Attending: Oncology

## 2023-07-13 ENCOUNTER — Other Ambulatory Visit: Payer: Medicare HMO

## 2023-07-13 VITALS — BP 109/92 | HR 113 | Temp 96.1°F | Resp 20 | Ht 72.0 in | Wt 159.6 lb

## 2023-07-13 DIAGNOSIS — D509 Iron deficiency anemia, unspecified: Secondary | ICD-10-CM | POA: Diagnosis not present

## 2023-07-13 DIAGNOSIS — R079 Chest pain, unspecified: Secondary | ICD-10-CM | POA: Diagnosis not present

## 2023-07-13 DIAGNOSIS — R634 Abnormal weight loss: Secondary | ICD-10-CM | POA: Insufficient documentation

## 2023-07-13 DIAGNOSIS — Z87891 Personal history of nicotine dependence: Secondary | ICD-10-CM | POA: Diagnosis not present

## 2023-07-13 DIAGNOSIS — C3411 Malignant neoplasm of upper lobe, right bronchus or lung: Secondary | ICD-10-CM | POA: Diagnosis not present

## 2023-07-13 DIAGNOSIS — Z7982 Long term (current) use of aspirin: Secondary | ICD-10-CM | POA: Insufficient documentation

## 2023-07-13 DIAGNOSIS — Z7952 Long term (current) use of systemic steroids: Secondary | ICD-10-CM | POA: Insufficient documentation

## 2023-07-13 DIAGNOSIS — Z800 Family history of malignant neoplasm of digestive organs: Secondary | ICD-10-CM | POA: Insufficient documentation

## 2023-07-13 DIAGNOSIS — Z79899 Other long term (current) drug therapy: Secondary | ICD-10-CM | POA: Diagnosis not present

## 2023-07-13 DIAGNOSIS — Z8673 Personal history of transient ischemic attack (TIA), and cerebral infarction without residual deficits: Secondary | ICD-10-CM | POA: Insufficient documentation

## 2023-07-13 DIAGNOSIS — Z8616 Personal history of COVID-19: Secondary | ICD-10-CM | POA: Diagnosis not present

## 2023-07-13 DIAGNOSIS — J449 Chronic obstructive pulmonary disease, unspecified: Secondary | ICD-10-CM | POA: Diagnosis not present

## 2023-07-13 DIAGNOSIS — Z791 Long term (current) use of non-steroidal anti-inflammatories (NSAID): Secondary | ICD-10-CM | POA: Insufficient documentation

## 2023-07-13 DIAGNOSIS — Z5112 Encounter for antineoplastic immunotherapy: Secondary | ICD-10-CM | POA: Diagnosis not present

## 2023-07-13 MED ORDER — SODIUM CHLORIDE 0.9% FLUSH
10.0000 mL | INTRAVENOUS | Status: DC | PRN
Start: 2023-07-13 — End: 2023-07-13
  Administered 2023-07-13: 10 mL
  Filled 2023-07-13: qty 10

## 2023-07-13 MED ORDER — HEPARIN SOD (PORK) LOCK FLUSH 100 UNIT/ML IV SOLN
500.0000 [IU] | Freq: Once | INTRAVENOUS | Status: AC | PRN
  Administered 2023-07-13: 500 [IU]
  Filled 2023-07-13: qty 5

## 2023-07-13 MED ORDER — DURVALUMAB 500 MG/10ML IV SOLN
10.0000 mg/kg | Freq: Once | INTRAVENOUS | Status: AC
  Administered 2023-07-13: 740 mg via INTRAVENOUS
  Filled 2023-07-13: qty 4.8

## 2023-07-13 MED ORDER — SODIUM CHLORIDE 0.9 % IV SOLN
Freq: Once | INTRAVENOUS | Status: AC
  Filled 2023-07-13: qty 250

## 2023-07-13 MED ORDER — SODIUM CHLORIDE 0.9 % IV SOLN: 10.0000 mg/kg | Freq: Once | INTRAVENOUS | Status: DC

## 2023-07-13 NOTE — Patient Instructions (Signed)
[facility] - A DEPT OF [facility]  Discharge Instructions: Thank you for choosing [facility] to provide your oncology and hematology care.  If you have a lab appointment with the Cancer Center, please go directly to the Cancer Center and check in at the registration area.  Wear comfortable clothing and clothing appropriate for easy access to any Portacath or PICC line.   We strive to give you quality time with your provider. You may need to reschedule your appointment if you arrive late (15 or more minutes).  Arriving late affects you and other patients whose appointments are after yours.  Also, if you miss three or more appointments without notifying the office, you may be dismissed from the clinic at the provider's discretion.      For prescription refill requests, have your pharmacy contact our office and allow 72 hours for refills to be completed.    Today you received the following chemotherapy and/or immunotherapy agents Durvalumab.      To help prevent nausea and vomiting after your treatment, we encourage you to take your nausea medication as directed.  BELOW ARE SYMPTOMS THAT SHOULD BE REPORTED IMMEDIATELY: *FEVER GREATER THAN 100.4 F (38 C) OR HIGHER *CHILLS OR SWEATING *NAUSEA AND VOMITING THAT IS NOT CONTROLLED WITH YOUR NAUSEA MEDICATION *UNUSUAL SHORTNESS OF BREATH *UNUSUAL BRUISING OR BLEEDING *URINARY PROBLEMS (pain or burning when urinating, or frequent urination) *BOWEL PROBLEMS (unusual diarrhea, constipation, pain near the anus) TENDERNESS IN MOUTH AND THROAT WITH OR WITHOUT PRESENCE OF ULCERS (sore throat, sores in mouth, or a toothache) UNUSUAL RASH, SWELLING OR PAIN  UNUSUAL VAGINAL DISCHARGE OR ITCHING   Items with * indicate a potential emergency and should be followed up as soon as possible or go to the Emergency Department if any problems should occur.  Please show the CHEMOTHERAPY ALERT CARD or IMMUNOTHERAPY  ALERT CARD at check-in to the Emergency Department and triage nurse.  Should you have questions after your visit or need to cancel or reschedule your appointment, please contact [facility] - A DEPT OF [facility]  [phone] and follow the prompts.  Office hours are 8:00 a.m. to 4:30 p.m. Monday - Friday. Please note that voicemails left after 4:00 p.m. may not be returned until the following business day.  We are closed weekends and major holidays. You have access to a nurse at all times for urgent questions. Please call the main number to the clinic [phone] and follow the prompts.  For any non-urgent questions, you may also contact your provider using MyChart. We now offer e-Visits for anyone 54 and older to request care online for non-urgent symptoms. For details visit [email or web address].   Also download the MyChart app! Go to the app store, search "MyChart", open the app, select , and log in with your MyChart username and password.

## 2023-07-13 NOTE — Progress Notes (Signed)
OK to use today's weight for dosing Durvalumab per Dr. Alena Bills (Dr. Orlie Dakin out on [facility]).  Ebony Hail, Pharm.D., CPP 07/13/2023@2 :18 PM

## 2023-07-14 ENCOUNTER — Encounter: Payer: Self-pay | Admitting: Oncology

## 2023-07-14 ENCOUNTER — Encounter: Payer: Self-pay | Admitting: Radiation Oncology

## 2023-07-14 ENCOUNTER — Other Ambulatory Visit: Payer: Self-pay | Admitting: *Deleted

## 2023-07-14 ENCOUNTER — Ambulatory Visit
Admission: RE | Admit: 2023-07-14 | Discharge: 2023-07-14 | Disposition: A | Discharge status: Home / Self Care | Payer: Medicare HMO | Source: Ambulatory Visit | Attending: Radiation Oncology | Admitting: Radiation Oncology

## 2023-07-14 VITALS — BP 120/87 | HR 105 | Temp 97.7°F | Resp 12 | Wt 161.0 lb

## 2023-07-14 DIAGNOSIS — C3411 Malignant neoplasm of upper lobe, right bronchus or lung: Secondary | ICD-10-CM | POA: Diagnosis not present

## 2023-07-14 NOTE — Progress Notes (Signed)
Radiation Oncology Follow up Note  Name: Ethan Fitzgerald   Date:   07/14/2023 MRN:  [id number] DOB: 08/16/1956    This 67 y.o. 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  REFERRING PROVIDER: Saralyn Pilar *  HPI: Patient is a 67 year old male now out 1 month having completed concurrent chemoradiation therapy for stage IIIa adenocarcinoma right upper lobe.  Seen today in follow-up he is doing well specifically [place] cough hemoptysis chest tightness or any dysphagia..  Related to mowing his lawn without a mask.  Patient is currently on maintenance Durvalumab  COMPLICATIONS OF TREATMENT: none  FOLLOW UP COMPLIANCE: keeps appointments   PHYSICAL EXAM:  BP 120/87   Pulse (!) 105   Temp 97.7 F (36.5 C) (Tympanic)   Resp 12   Wt 161 lb (73 kg)   BMI 21.84 kg/m  Well-developed well-nourished patient in NAD. HEENT reveals PERLA, EOMI, discs not visualized.  Oral cavity is clear. No oral mucosal lesions are identified. Neck is clear without evidence of cervical or supraclavicular adenopathy. Lungs are clear to A&P. Cardiac examination is essentially unremarkable with regular rate and rhythm without murmur rub or thrill. Abdomen is benign with no organomegaly or masses noted. Motor sensory and DTR levels are equal and symmetric in the upper and lower extremities. Cranial nerves II through XII are grossly intact. Proprioception is intact. No peripheral adenopathy or edema is identified. No motor or sensory levels are noted. Crude visual fields are within normal range.  RADIOLOGY RESULTS: No current films for review  PLAN: Present time patient is doing well low side effect profile from his concurrent treatment.  And pleased with his overall progress he continues on maintenance value Mab.  I am asked to see him back in 3 to 4 months with a CT scan of his chest prior to that  visit.  If medical oncology is already ordered a CT scan with EC hours.  Patient knows to call with any concerns.  I would like to take this opportunity to thank you for allowing me to participate in the care of your patient.Carmina Miller, MD

## 2023-07-20 ENCOUNTER — Ambulatory Visit: Payer: Medicare HMO | Admitting: Oncology

## 2023-07-20 ENCOUNTER — Other Ambulatory Visit: Payer: Medicare HMO

## 2023-07-20 ENCOUNTER — Ambulatory Visit: Payer: Medicare HMO

## 2023-07-21 ENCOUNTER — Ambulatory Visit: Payer: Medicare HMO | Admitting: Neurology

## 2023-07-21 DIAGNOSIS — R569 Unspecified convulsions: Secondary | ICD-10-CM

## 2023-07-22 ENCOUNTER — Other Ambulatory Visit: Payer: Self-pay | Admitting: Oncology

## 2023-07-27 ENCOUNTER — Other Ambulatory Visit: Payer: Medicare HMO

## 2023-07-27 ENCOUNTER — Ambulatory Visit: Payer: Medicare HMO | Admitting: Oncology

## 2023-07-27 ENCOUNTER — Ambulatory Visit: Payer: Medicare HMO

## 2023-07-28 ENCOUNTER — Inpatient Hospital Stay: Payer: Medicare HMO | Admitting: Oncology

## 2023-07-28 ENCOUNTER — Inpatient Hospital Stay: Payer: Medicare HMO

## 2023-07-28 ENCOUNTER — Encounter: Payer: Self-pay | Admitting: Oncology

## 2023-07-28 VITALS — BP 111/89 | HR 92 | Temp 97.4°F | Resp 16 | Ht 72.0 in | Wt 152.9 lb

## 2023-07-28 DIAGNOSIS — Z5112 Encounter for antineoplastic immunotherapy: Secondary | ICD-10-CM | POA: Diagnosis not present

## 2023-07-28 DIAGNOSIS — C3411 Malignant neoplasm of upper lobe, right bronchus or lung: Secondary | ICD-10-CM

## 2023-07-28 DIAGNOSIS — R079 Chest pain, unspecified: Secondary | ICD-10-CM | POA: Diagnosis not present

## 2023-07-28 DIAGNOSIS — Z87891 Personal history of nicotine dependence: Secondary | ICD-10-CM | POA: Diagnosis not present

## 2023-07-28 DIAGNOSIS — D509 Iron deficiency anemia, unspecified: Secondary | ICD-10-CM | POA: Diagnosis not present

## 2023-07-28 DIAGNOSIS — Z8616 Personal history of COVID-19: Secondary | ICD-10-CM | POA: Diagnosis not present

## 2023-07-28 DIAGNOSIS — R634 Abnormal weight loss: Secondary | ICD-10-CM | POA: Diagnosis not present

## 2023-07-28 DIAGNOSIS — Z8673 Personal history of transient ischemic attack (TIA), and cerebral infarction without residual deficits: Secondary | ICD-10-CM | POA: Diagnosis not present

## 2023-07-28 DIAGNOSIS — J449 Chronic obstructive pulmonary disease, unspecified: Secondary | ICD-10-CM | POA: Diagnosis not present

## 2023-07-28 LAB — CBC WITH DIFFERENTIAL (CANCER CENTER ONLY)
Abs Immature Granulocytes: 0.22 10*3/uL — ABNORMAL HIGH (ref 0.00–0.07)
Basophils Absolute: 0.1 10*3/uL (ref 0.0–0.1)
Basophils Relative: 1 %
Eosinophils Absolute: 0.1 10*3/uL (ref 0.0–0.5)
Eosinophils Relative: 2 %
HCT: 37.1 % — ABNORMAL LOW (ref 39.0–52.0)
Hemoglobin: 12.3 g/dL — ABNORMAL LOW (ref 13.0–17.0)
Immature Granulocytes: 4 %
Lymphocytes Relative: 12 %
Lymphs Abs: 0.8 10*3/uL (ref 0.7–4.0)
MCH: 33.2 pg (ref 26.0–34.0)
MCHC: 33.2 g/dL (ref 30.0–36.0)
MCV: 100.3 fL — ABNORMAL HIGH (ref 80.0–100.0)
Monocytes Absolute: 0.5 10*3/uL (ref 0.1–1.0)
Monocytes Relative: 8 %
Neutro Abs: 4.5 10*3/uL (ref 1.7–7.7)
Neutrophils Relative %: 73 %
Platelet Count: 332 10*3/uL (ref 150–400)
RBC: 3.7 MIL/uL — ABNORMAL LOW (ref 4.22–5.81)
RDW: 15.8 % — ABNORMAL HIGH (ref 11.5–15.5)
WBC Count: 6.1 10*3/uL (ref 4.0–10.5)
nRBC: 0 % (ref 0.0–0.2)

## 2023-07-28 LAB — CMP (CANCER CENTER ONLY)
ALT: 19 U/L (ref 0–44)
AST: 25 U/L (ref 15–41)
Albumin: 3.6 g/dL (ref 3.5–5.0)
Alkaline Phosphatase: 62 U/L (ref 38–126)
Anion gap: 12 (ref 5–15)
BUN: 25 mg/dL — ABNORMAL HIGH (ref 8–23)
CO2: 21 mmol/L — ABNORMAL LOW (ref 22–32)
Calcium: 9.9 mg/dL (ref 8.9–10.3)
Chloride: 108 mmol/L (ref 98–111)
Creatinine: 1.22 mg/dL (ref 0.61–1.24)
GFR, Estimated: >60 mL/min (ref >60)
Glucose, Bld: 124 mg/dL — ABNORMAL HIGH (ref 70–99)
Potassium: 3.7 mmol/L (ref 3.5–5.1)
Sodium: 141 mmol/L (ref 135–145)
Total Bilirubin: 0.9 mg/dL (ref <1.2)
Total Protein: 7.1 g/dL (ref 6.5–8.1)

## 2023-07-28 MED ORDER — SODIUM CHLORIDE 0.9 % IV SOLN
10.0000 mg/kg | Freq: Once | INTRAVENOUS | Status: AC
  Administered 2023-07-28: 740 mg via INTRAVENOUS
  Filled 2023-07-28: qty 4.8

## 2023-07-28 MED ORDER — SODIUM CHLORIDE 0.9 % IV SOLN
Freq: Once | INTRAVENOUS | Status: AC
  Filled 2023-07-28: qty 250

## 2023-07-28 MED ORDER — SODIUM CHLORIDE 0.9% FLUSH
10.0000 mL | INTRAVENOUS | Status: DC | PRN
  Administered 2023-07-28: 10 mL
  Filled 2023-07-28: qty 10

## 2023-07-28 MED ORDER — HEPARIN SOD (PORK) LOCK FLUSH 100 UNIT/ML IV SOLN
500.0000 [IU] | Freq: Once | INTRAVENOUS | Status: AC | PRN
  Administered 2023-07-28: 500 [IU]
  Filled 2023-07-28: qty 5

## 2023-07-28 NOTE — Patient Instructions (Signed)
 [facility] - A DE[facility]  Discharge Instructions: Thank you for choosing [facility] to provide your oncology and hematology care.  If you have a lab appointment with the Cancer Center, please go directly to the Cancer Center and check in at the registration area.  Wear comfortable clothing and clothing appropriate for easy access to any Portacath or PICC line.   We strive to give you quality time with your provider. You may need to reschedule your appointment if you arrive late (15 or more minutes).  Arriving late affects you and other patients whose appointments are after yours.  Also, if you miss three or more appointments without notifying the office, you may be dismissed from the clinic at the provider's discretion.      For prescription refill requests, have your pharmacy contact our office and allow 72 hours for refills to be completed.    Today you received the following chemotherapy and/or immunotherapy agents Imfinzi      To help prevent nausea and vomiting after your treatment, we encourage you to take your nausea medication as directed.  BELOW ARE SYMPTOMS THAT SHOULD BE REPORTED IMMEDIATELY: *FEVER GREATER THAN 100.4 F (38 C) OR HIGHER *CHILLS OR SWEATING *NAUSEA AND VOMITING THAT IS NOT CONTROLLED WITH YOUR NAUSEA MEDICATION *UNUSUAL SHORTNESS OF BREATH *UNUSUAL BRUISING OR BLEEDING *URINARY PROBLEMS (pain or burning when urinating, or frequent urination) *BOWEL PROBLEMS (unusual diarrhea, constipation, pain near the anus) TENDERNESS IN MOUTH AND THROAT WITH OR WITHOUT PRESENCE OF ULCERS (sore throat, sores in mouth, or a toothache) UNUSUAL RASH, SWELLING OR PAIN  UNUSUAL VAGINAL DISCHARGE OR ITCHING   Items with * indicate a potential emergency and should be followed up as soon as possible or go to the Emergency Department if any problems should occur.  Please show the CHEMOTHERAPY ALERT CARD or IMMUNOTHERAPY ALERT  CARD at check-in to the Emergency Department and triage nurse.  Should you have questions after your visit or need to cancel or reschedule your appointment, please contact [place] [facility] - A DEPT OF [facility]  [phone] and follow the prompts.  Office hours are 8:00 a.m. to 4:30 p.m. Monday - Friday. Please note that voicemails left after 4:00 p.m. may not be returned until the following business day.  We are closed weekends and major holidays. You have access to a nurse at all times for urgent questions. Please call the main number to the clinic [phone] and follow the prompts.  For any non-urgent questions, you may also contact your provider using MyChart. We now offer e-Visits for anyone 3 and older to request care online for non-urgent symptoms. For details visit [email or web address].   Also download the MyChart app! Go to the app store, search "MyChart", open the app, select [place], and log in with your MyChart username and password.

## 2023-07-28 NOTE — Progress Notes (Signed)
[facility]  Telephone:([phone]) [phone] Fax:(336) [phone]  ID: COSTA MACKIEWICZ OB: Feb 10, 1956  MR#: [id number]  AOZ#:[id number]  Patient Care Team: Smitty Cords, DO as PCP - General (Family Medicine) Micki Riley, MD as Referring Physician (Neurology) Glory Buff, RN as Oncology Nurse Navigator Orlie Dakin, Tollie Pizza, MD as Consulting Physician (Oncology)  CHIEF COMPLAINT: Stage IIIa adenocarcinoma of the right upper lobe lung.  INTERVAL HISTORY: Patient returns to clinic today for further evaluation and consideration of cycle 4 of maintenance durvalumab.  He reports cycle 3 "hit him like a truck", but then he felt improved after approximately 30 minutes.  He currently feels well and is asymptomatic.  He has a fair appetite.  He does not complain of pain today.  He has no new neurologic complaints.  He denies any recent fevers or illnesses.   He denies any chest pain, shortness of breath, cough, or hemoptysis.  He has no nausea, vomiting, constipation, or diarrhea.  He has no urinary complaints.  Patient offers further no specific complaints today.  REVIEW OF SYSTEMS:   Review of Systems  Constitutional: Negative.  Negative for fever, malaise/fatigue and weight loss.  Respiratory: Negative.  Negative for cough, hemoptysis and shortness of breath.   Cardiovascular: Negative.  Negative for chest pain and leg swelling.  Gastrointestinal: Negative.  Negative for abdominal pain.  Genitourinary: Negative.  Negative for dysuria.  Musculoskeletal: Negative.  Negative for back pain.  Skin: Negative.  Negative for rash.  Neurological: Negative.  Negative for dizziness, focal weakness, weakness and headaches.  Psychiatric/Behavioral: Negative.  The patient is not nervous/anxious.   All other systems reviewed and are negative.   As per HPI. Otherwise, a complete review of systems is negative.  PAST MEDICAL HISTORY: Past Medical History:  Diagnosis Date   COPD  (chronic obstructive pulmonary disease) (HCC)    mild   COVID-19 09/2019   Depression    Erectile dysfunction    Headache    daily, stress headaches   Knee pain, left    Seasonal allergies    Stroke (HCC)    Tobacco dependence     PAST SURGICAL HISTORY: Past Surgical History:  Procedure Laterality Date   BACK SURGERY     metal plate in back   COLONOSCOPY     COLONOSCOPY WITH PROPOFOL N/A 04/26/2019   Procedure: COLONOSCOPY WITH PROPOFOL;  Surgeon: Wyline Mood, MD;  [facility] END[facility]COPY;  Service: Gastroenterology;  Laterality: N/A;   FINE NEEDLE ASPIRATION  03/03/2023   Procedure: FINE NEEDLE ASPIRATION;  Surgeon: Raechel Chute, MD;  Location: [facility] ENDOSCOPY;  Service: Pulmonary;;   HERNIA REPAIR Right    IR ANGIO INTRA EXTRACRAN SEL COM CAROTID INNOMINATE UNI L MOD SED  03/27/2020   IR ANGIO VERTEBRAL SEL SUBCLAVIAN INNOMINATE UNI L MOD SED  03/27/2020   IR ANGIO VERTEBRAL SEL VERTEBRAL UNI R MOD SED  03/27/2020   IR CT HEAD LTD  03/27/2020   IR IMAGING GUIDED PORT INSERTION  03/29/2023   IR PERCUTANEOUS ART THROMBECTOMY/INFUSION INTRACRANIAL INC DIAG ANGIO  03/27/2020   KNEE ARTHROSCOPY Left 03/28/2015   Procedure: Arthroscopic partial medial meniscectomy plus chondral debridement;  Surgeon: Erin Sons, MD;  Location: [facility];  Service: Orthopedics;  Laterality: Left;   RADIOLOGY WITH ANESTHESIA N/A 03/27/2020   Procedure: IR WITH ANESTHESIA;  Surgeon: Julieanne Cotton, MD;  Location: [facility] OR;  Service: Radiology;  Laterality: N/A;   VIDEO BRONCHOSCOPY WITH ENDOBRONCHIAL ULTRASOUND  03/03/2023   Procedure: VIDEO BRONCHOSCOPY WITH  ENDOBRONCHIAL ULTRASOUND;  Surgeon: Raechel Chute, MD;  Location: [facility] ENDOSCOPY;  Service: Pulmonary;;    FAMILY HISTORY: Family History  Problem Relation Age of Onset   Diabetes Mother    Heart attack Mother 27   Cancer Father        liver    COPD Brother    HIV/AIDS Brother    Prostate cancer Neg Hx    Colon cancer Neg  Hx     ADVANCED DIRECTIVES (Y/N):  N  HEALTH MAINTENANCE: Social History   Tobacco Use   Smoking status: Former    Current packs/day: 0.00    Average packs/day: 1.5 packs/day for 49.0 years (73.5 ttl pk-yrs)    Types: Cigarettes    Start date: 4    Quit date: 09/14/2017    Years since quitting: 5.8   Smokeless tobacco: Former  Building services engineer status: Never Used  Substance Use Topics   Alcohol use: Not Currently    Comment: 6 pack on weekend   Drug use: No     Colonoscopy:  PAP:  Bone density:  Lipid panel:  No Known Allergies  Current Outpatient Medications  Medication Sig Dispense Refill   albuterol (VENTOLIN HFA) 108 (90 Base) MCG/ACT inhaler Inhale 2 puffs into the lungs every 4 (four) hours as needed for wheezing or shortness of breath. 1 each 2   aspirin EC 325 MG EC tablet Take 1 tablet (325 mg total) by mouth daily. 90 tablet 0   b complex vitamins capsule Take 1 capsule by mouth daily.     Cholecalciferol (VITAMIN D) 50 MCG (2000 UT) tablet Take 2,000 Units by mouth daily.     fluticasone (FLONASE) 50 MCG/ACT nasal spray Place 2 sprays into both nostrils daily. (Patient taking differently: Place 2 sprays into both nostrils daily as needed for allergies.) 48 g 3   melatonin 5 MG TABS Take 5 mg by mouth at bedtime as needed (sleep).     midodrine (PROAMATINE) 5 MG tablet Take 10 mg by mouth 3 (three) times daily with meals.     Multiple Vitamin (MULTIVITAMIN WITH MINERALS) TABS tablet Take 1 tablet by mouth daily.     Multiple Vitamins-Minerals (IMMUNE SUPPORT VITAMIN C PO) Take 1 tablet by mouth daily.     naproxen (NAPROSYN) 500 MG tablet Take 1 tablet (500 mg total) by mouth 2 (two) times daily with a meal. 10 tablet 0   Oxycodone HCl 10 MG TABS Take 1 tablet (10 mg total) by mouth every 6 (six) hours as needed. Take with stool softeners to avoid constipation 120 tablet 0   polyethylene glycol (MIRALAX) 17 g packet Take 17 g by mouth 2 (two) times daily as  needed for mild constipation or moderate constipation (Hold if diarrhea.). 100 each 2   predniSONE (DELTASONE) 10 MG tablet Take 4 tablets (40 mg total) by mouth daily with breakfast. 20 tablet 0   predniSONE (DELTASONE) 10 MG tablet Take 1 tablet (10 mg total) by mouth daily. 30 tablet 0   rosuvastatin (CRESTOR) 40 MG tablet Take 1 tablet (40 mg total) by mouth every other day. 45 tablet 3   senna (SENOKOT) 8.6 MG TABS tablet Take 1-2 tablets (8.6-17.2 mg total) by mouth 2 (two) times daily as needed for mild constipation or moderate constipation (Hold if diarrhea). 120 tablet 0   sildenafil (REVATIO) 20 MG tablet TAKE 1 TO 5 TABLETS BY MOUTH ABOUT 30 MINUTES PRIOR TO SEX.START WITH 1 THEN INCREASE 30 tablet  5   STIOLTO RESPIMAT 2.5-2.5 MCG/ACT AERS INHALE 2 PUFFS EVERY DAY 12 g 3   sucralfate (CARAFATE) 1 g tablet Take 1 tablet (1 g total) by mouth 3 (three) times daily before meals. 90 tablet 1   No current facility-administered medications for this visit.   Facility-Administered Medications Ordered in Other Visits  Medication Dose Route Frequency Provider Last Rate Last Admin   0.9 %  sodium chloride infusion   Intravenous Once Orlie Dakin, Tollie Pizza, MD       durvalumab (IMFINZI) 740 mg in sodium chloride 0.9 % 100 mL chemo infusion  10 mg/kg (Treatment Plan Recorded) Intravenous Once Jeralyn Ruths, MD        OBJECTIVE: Vitals:   07/28/23 0908  BP: 111/89  Pulse: 92  Resp: 16  Temp: (!) 97.4 F (36.3 C)  SpO2: 99%      Body mass index is 20.74 kg/m.    ECOG FS:0 - Asymptomatic  General: Well-developed, well-nourished, no acute distress. Eyes: Pink conjunctiva, anicteric sclera. HEENT: Normocephalic, moist mucous membranes. Lungs: No audible wheezing or coughing. Heart: Regular rate and rhythm. Abdomen: Soft, nontender, no obvious distention. Musculoskeletal: No edema, cyanosis, or clubbing. Neuro: Alert, answering all questions appropriately. Cranial nerves grossly  intact. Skin: No rashes or petechiae noted. Psych: Normal affect.  LAB RESULTS:  Lab Results  Component Value Date   NA 141 07/28/2023   K 3.7 07/28/2023   CL 108 07/28/2023   CO2 21 (L) 07/28/2023   GLUCOSE 124 (H) 07/28/2023   BUN 25 (H) 07/28/2023   CREATININE 1.22 07/28/2023   CALCIUM 9.9 07/28/2023   PROT 7.1 07/28/2023   ALBUMIN 3.6 07/28/2023   AST 25 07/28/2023   ALT 19 07/28/2023   ALKPHOS 62 07/28/2023   BILITOT 0.9 07/28/2023   GFRNONAA >60 07/28/2023   GFRAA 85 12/22/2020    Lab Results  Component Value Date   WBC 6.1 07/28/2023   NEUTROABS 4.5 07/28/2023   HGB 12.3 (L) 07/28/2023   HCT 37.1 (L) 07/28/2023   MCV 100.3 (H) 07/28/2023   PLT 332 07/28/2023     STUDIES: EEG adult  Result Date: 2023-07-26       [facility] [address] [place]. [place] [address] [phone]      Electroencephalogram Procedure Note Mr. ARTAVION TIEGS Date of Birth:  Jan 20, 1956 Medical Record Number:  [id number] Indications: Diagnostic Date of Procedure  26-Jul-2023 Medications: none Clinical history : 67 year old patient being evaluated for seizure and memory loss Technical Description This study was performed using 17 channel digital electroencephalographic recording equipment. International 10-20 electrode placement was used. The record was obtained with the patient awake, drowsy, and asleep.  The record is of fair technical quality for purposes of interpretation. Activation Procedures:  photic stimulation . EEG Description Awake: Alpha Activity: The waking state record contains a well-defined bi-occipital alpha rhythm of  moderate amplitude with a dominant frequency of 9 Hz. Reactivity is present. No paroxsymal activity, spikes, or sharp waves are noted. Technical component of study is adequate. EKG tracing shows regular sinus rhythm Length of this recording is 25 minutes and 37 seconds Sleep: With drowsiness, there is attenuation of the background alpha activity. As  the patient enters into light sleep, vertex waves and symmetrical spindles are noted. K complexes are noted in sleep. Transition to the waking state is unremarkable. Result of Activation Procedures: Hyperventilation: N/A. Photo Stimulation: No photic driving response is noted. Summary Normal electroencephalogram, awake, asleep and with activation procedures.  There are no focal lateralizing or epileptiform features.    ASSESSMENT: Stage IIIa adenocarcinoma of the right upper lobe lung.  PLAN:    Stage IIIa adenocarcinoma of the right upper lobe lung: Pathology and imaging reviewed independently confirming diagnosis and stage of disease.  PET scan results from February 14, 2023 reviewed independently with no obvious evidence of metastatic disease.  MRI of the brain from February 11, 2023 also negative for metastatic disease.  Surgical resection was not an option, therefore patient proceeded with concurrent XRT along with weekly carboplatin and Taxol. This will be followed by maintenance durvalumab every 2 weeks for 1 year.  Patient has had port placement.  Patient completed concurrent XRT and chemotherapy on May 27, 2023.  Proceed with cycle 4 of maintenance durvalumab today.  Return to clinic in 2 weeks for treatment only and then in 4 weeks for further evaluation and consideration of cycle 6.  Will reimage with PET scan prior to cycle 6.     Chest pain: Patient does not complain of this today.  Continue oxycodone as needed. Iron deficiency anemia: Essentially resolved.  Patient's hemoglobin is 12.3 today.  He last received IV Venofer on May 25, 2023. Leukocytosis: Resolved. Weight loss: Improving.  Follow-up with dietary as indicated. Hypokalemia: Resolved.  Patient was previously given dietary changes.  Patient expressed understanding and was in agreement with this plan. He also understands that He can call clinic at any time with any questions, concerns, or complaints.    Cancer Staging   Adenocarcinoma of upper lobe of right lung [facility]) Staging form: Lung, AJCC 8th Edition - Clinical stage from 03/15/2023: Stage IIIA (cT4, cN0, cM0) - Signed by Jeralyn Ruths, MD on 03/15/2023 Stage prefix: Initial diagnosis   Jeralyn Ruths, MD   07/28/2023 9:49 AM

## 2023-07-28 NOTE — Progress Notes (Signed)
Nutrition Follow-up:  Patient with lung cancer.  Patient has completed concurrent chemotherapy and radiation.  Receiving durvalumab (imfinzi).  Met with patient during infusion.  Reports that appetite is good.  Drinking ensure usually in the am.  Denies any nutrition impact symptoms.    Medications: reviewed  Labs: reviewed  Anthropometrics:   Weight 152 lb 14.4 oz on 11/21 155 lb on 10/16 143 lb on 9/10 140 lb on 8/27 141 lb on 8/6 152 lb on 6/1   NUTRITION DIAGNOSIS: Inadequate oral intake improving   INTERVENTION:  Encouraged well balanced diet including lean protein foods    MONITORING, EVALUATION, GOAL: weight trends, intake   NEXT VISIT: as needed  Chirag Krueger B. Freida Busman, RD, LDN Registered Dietitian [phone]

## 2023-08-02 ENCOUNTER — Other Ambulatory Visit: Payer: Self-pay | Admitting: Family Medicine

## 2023-08-02 DIAGNOSIS — I951 Orthostatic hypotension: Secondary | ICD-10-CM

## 2023-08-02 NOTE — Telephone Encounter (Signed)
Requested medication (s) are due for refill today - unsure  Requested medication (s) are on the active medication list -yes  Future visit scheduled -no  Last refill: historical medication  Notes to clinic: non delegated Rx  Requested Prescriptions  Pending Prescriptions Disp Refills   midodrine (PROAMATINE) 5 MG tablet [Pharmacy Med Name: MIDODRINE HCL 5 MG TABLET] 270 tablet 1    Sig: TAKE 1 TABLET BY MOUTH 3 TIMES DAILY WITH MEALS.     Not Delegated - Cardiovascular: Midodrine Failed - 08/02/2023  1:34 AM      Failed - This refill cannot be delegated      Passed - Cr in normal range and within 360 days    Creatinine  Date Value Ref Range Status  07/28/2023 1.22 0.61 - 1.24 mg/dL Final   Creat  Date Value Ref Range Status  12/22/2022 0.92 0.70 - 1.35 mg/dL Final         Passed - ALT in normal range and within 360 days    ALT  Date Value Ref Range Status  07/28/2023 19 0 - 44 U/L Final         Passed - AST in normal range and within 360 days    AST  Date Value Ref Range Status  07/28/2023 25 15 - 41 U/L Final         Passed - Last BP in normal range    BP Readings from Last 1 Encounters:  07/28/23 111/89         Passed - Valid encounter within last 12 months    Recent Outpatient Visits           6 months ago Right-sided chest wall pain   [place] [facility] [place], Salvadore Oxford, NP   7 months ago Annual physical exam   [place] [facility] Smitty Cords, DO   9 months ago Orthostatic hypotension   [place] [facility] [place], Netta Neat, DO   10 months ago Acute right-sided low back pain with right-sided sciatica   [place] [facility] [place], Salvadore Oxford, NP   1 year ago Centrilobular emphysema [facility])   [facility] [place], Gentry Fitz A, RPH-CPP                 Requested Prescriptions  Pending Prescriptions Disp Refills   midodrine  (PROAMATINE) 5 MG tablet [Pharmacy Med Name: MIDODRINE HCL 5 MG TABLET] 270 tablet 1    Sig: TAKE 1 TABLET BY MOUTH 3 TIMES DAILY WITH MEALS.     Not Delegated - Cardiovascular: Midodrine Failed - 08/02/2023  1:34 AM      Failed - This refill cannot be delegated      Passed - Cr in normal range and within 360 days    Creatinine  Date Value Ref Range Status  07/28/2023 1.22 0.61 - 1.24 mg/dL Final   Creat  Date Value Ref Range Status  12/22/2022 0.92 0.70 - 1.35 mg/dL Final         Passed - ALT in normal range and within 360 days    ALT  Date Value Ref Range Status  07/28/2023 19 0 - 44 U/L Final         Passed - AST in normal range and within 360 days    AST  Date Value Ref Range Status  07/28/2023 25 15 - 41 U/L Final  Passed - Last BP in normal range    BP Readings from Last 1 Encounters:  07/28/23 111/89         Passed - Valid encounter within last 12 months    Recent Outpatient Visits           6 months ago Right-sided chest wall pain   [place] [facility] [place], Salvadore Oxford, NP   7 months ago Annual physical exam   [place] [facility] Smitty Cords, DO   9 months ago Orthostatic hypotension   [place] [facility] Smitty Cords, DO   10 months ago Acute right-sided low back pain with right-sided sciatica   [place] [facility] [place], Salvadore Oxford, NP   1 year ago Centrilobular emphysema [facility])   [place] [facility] D[place], Jackelyn Poling, RPH-CPP

## 2023-08-03 ENCOUNTER — Ambulatory Visit: Payer: Medicare HMO

## 2023-08-10 ENCOUNTER — Inpatient Hospital Stay: Payer: Medicare HMO | Attending: Oncology

## 2023-08-10 VITALS — BP 133/94 | HR 94 | Temp 97.5°F

## 2023-08-10 DIAGNOSIS — Z87891 Personal history of nicotine dependence: Secondary | ICD-10-CM | POA: Insufficient documentation

## 2023-08-10 DIAGNOSIS — K8020 Calculus of gallbladder without cholecystitis without obstruction: Secondary | ICD-10-CM | POA: Diagnosis not present

## 2023-08-10 DIAGNOSIS — N200 Calculus of kidney: Secondary | ICD-10-CM | POA: Insufficient documentation

## 2023-08-10 DIAGNOSIS — Z791 Long term (current) use of non-steroidal anti-inflammatories (NSAID): Secondary | ICD-10-CM | POA: Insufficient documentation

## 2023-08-10 DIAGNOSIS — Z8673 Personal history of transient ischemic attack (TIA), and cerebral infarction without residual deficits: Secondary | ICD-10-CM | POA: Diagnosis not present

## 2023-08-10 DIAGNOSIS — Z8616 Personal history of COVID-19: Secondary | ICD-10-CM | POA: Insufficient documentation

## 2023-08-10 DIAGNOSIS — Z79899 Other long term (current) drug therapy: Secondary | ICD-10-CM | POA: Insufficient documentation

## 2023-08-10 DIAGNOSIS — J432 Centrilobular emphysema: Secondary | ICD-10-CM | POA: Insufficient documentation

## 2023-08-10 DIAGNOSIS — C3411 Malignant neoplasm of upper lobe, right bronchus or lung: Secondary | ICD-10-CM | POA: Diagnosis not present

## 2023-08-10 DIAGNOSIS — I700 Atherosclerosis of aorta: Secondary | ICD-10-CM | POA: Diagnosis not present

## 2023-08-10 DIAGNOSIS — Z800 Family history of malignant neoplasm of digestive organs: Secondary | ICD-10-CM | POA: Insufficient documentation

## 2023-08-10 DIAGNOSIS — Z7952 Long term (current) use of systemic steroids: Secondary | ICD-10-CM | POA: Diagnosis not present

## 2023-08-10 DIAGNOSIS — Z7982 Long term (current) use of aspirin: Secondary | ICD-10-CM | POA: Insufficient documentation

## 2023-08-10 DIAGNOSIS — Z5112 Encounter for antineoplastic immunotherapy: Secondary | ICD-10-CM | POA: Diagnosis not present

## 2023-08-10 DIAGNOSIS — N529 Male erectile dysfunction, unspecified: Secondary | ICD-10-CM | POA: Insufficient documentation

## 2023-08-10 MED ORDER — HEPARIN SOD (PORK) LOCK FLUSH 100 UNIT/ML IV SOLN
500.0000 [IU] | Freq: Once | INTRAVENOUS | Status: AC | PRN
  Administered 2023-08-10: 500 [IU]
  Filled 2023-08-10: qty 5

## 2023-08-10 MED ORDER — SODIUM CHLORIDE 0.9 % IV SOLN
10.0000 mg/kg | Freq: Once | INTRAVENOUS | Status: AC
  Administered 2023-08-10: 740 mg via INTRAVENOUS
  Filled 2023-08-10: qty 10

## 2023-08-10 MED ORDER — SODIUM CHLORIDE 0.9% FLUSH
10.0000 mL | INTRAVENOUS | Status: DC | PRN
  Administered 2023-08-10: 10 mL
  Filled 2023-08-10: qty 10

## 2023-08-10 MED ORDER — SODIUM CHLORIDE 0.9 % IV SOLN
Freq: Once | INTRAVENOUS | Status: AC
  Filled 2023-08-10: qty 250

## 2023-08-10 NOTE — Patient Instructions (Signed)

## 2023-08-16 ENCOUNTER — Other Ambulatory Visit: Payer: Self-pay | Admitting: Oncology

## 2023-08-17 ENCOUNTER — Ambulatory Visit
Admission: RE | Admit: 2023-08-17 | Discharge: 2023-08-17 | Disposition: A | Discharge status: Home / Self Care | Payer: Medicare HMO | Source: Ambulatory Visit | Attending: Oncology | Admitting: Oncology

## 2023-08-17 DIAGNOSIS — N200 Calculus of kidney: Secondary | ICD-10-CM | POA: Diagnosis not present

## 2023-08-17 DIAGNOSIS — C3411 Malignant neoplasm of upper lobe, right bronchus or lung: Secondary | ICD-10-CM | POA: Insufficient documentation

## 2023-08-17 DIAGNOSIS — I700 Atherosclerosis of aorta: Secondary | ICD-10-CM | POA: Insufficient documentation

## 2023-08-17 DIAGNOSIS — K8020 Calculus of gallbladder without cholecystitis without obstruction: Secondary | ICD-10-CM | POA: Diagnosis not present

## 2023-08-17 DIAGNOSIS — J439 Emphysema, unspecified: Secondary | ICD-10-CM | POA: Insufficient documentation

## 2023-08-17 LAB — GLUCOSE, CAPILLARY: Glucose-Capillary: 98 mg/dL (ref 70–99)

## 2023-08-17 MED ORDER — FLUDEOXYGLUCOSE F - 18 (FDG) INJECTION
7.9000 | Freq: Once | INTRAVENOUS | Status: AC | PRN
  Administered 2023-08-17: 8.55 via INTRAVENOUS

## 2023-08-18 ENCOUNTER — Other Ambulatory Visit: Payer: Self-pay | Admitting: Oncology

## 2023-08-18 DIAGNOSIS — C3411 Malignant neoplasm of upper lobe, right bronchus or lung: Secondary | ICD-10-CM

## 2023-08-24 ENCOUNTER — Encounter: Payer: Self-pay | Admitting: Oncology

## 2023-08-24 ENCOUNTER — Inpatient Hospital Stay: Payer: Medicare HMO

## 2023-08-24 ENCOUNTER — Inpatient Hospital Stay: Payer: Medicare HMO | Admitting: Oncology

## 2023-08-24 VITALS — BP 121/70 | HR 97 | Temp 96.4°F | Resp 16 | Ht 72.0 in | Wt 153.0 lb

## 2023-08-24 DIAGNOSIS — C3411 Malignant neoplasm of upper lobe, right bronchus or lung: Secondary | ICD-10-CM

## 2023-08-24 DIAGNOSIS — K8020 Calculus of gallbladder without cholecystitis without obstruction: Secondary | ICD-10-CM | POA: Diagnosis not present

## 2023-08-24 DIAGNOSIS — Z7982 Long term (current) use of aspirin: Secondary | ICD-10-CM | POA: Diagnosis not present

## 2023-08-24 DIAGNOSIS — Z5112 Encounter for antineoplastic immunotherapy: Secondary | ICD-10-CM | POA: Diagnosis not present

## 2023-08-24 DIAGNOSIS — J432 Centrilobular emphysema: Secondary | ICD-10-CM | POA: Diagnosis not present

## 2023-08-24 DIAGNOSIS — I700 Atherosclerosis of aorta: Secondary | ICD-10-CM | POA: Diagnosis not present

## 2023-08-24 DIAGNOSIS — N200 Calculus of kidney: Secondary | ICD-10-CM | POA: Diagnosis not present

## 2023-08-24 DIAGNOSIS — Z87891 Personal history of nicotine dependence: Secondary | ICD-10-CM | POA: Diagnosis not present

## 2023-08-24 DIAGNOSIS — N529 Male erectile dysfunction, unspecified: Secondary | ICD-10-CM | POA: Diagnosis not present

## 2023-08-24 LAB — CMP (CANCER CENTER ONLY)
ALT: 14 U/L (ref 0–44)
AST: 33 U/L (ref 15–41)
Albumin: 3.6 g/dL (ref 3.5–5.0)
Alkaline Phosphatase: 51 U/L (ref 38–126)
Anion gap: 9 (ref 5–15)
BUN: 19 mg/dL (ref 8–23)
CO2: 20 mmol/L — ABNORMAL LOW (ref 22–32)
Calcium: 9.7 mg/dL (ref 8.9–10.3)
Chloride: 108 mmol/L (ref 98–111)
Creatinine: 1.22 mg/dL (ref 0.61–1.24)
GFR, Estimated: >60 mL/min (ref >60)
Glucose, Bld: 147 mg/dL — ABNORMAL HIGH (ref 70–99)
Potassium: 4 mmol/L (ref 3.5–5.1)
Sodium: 137 mmol/L (ref 135–145)
Total Bilirubin: 1 mg/dL (ref <1.2)
Total Protein: 6.7 g/dL (ref 6.5–8.1)

## 2023-08-24 LAB — CBC WITH DIFFERENTIAL (CANCER CENTER ONLY)
Abs Immature Granulocytes: 0.07 10*3/uL (ref 0.00–0.07)
Basophils Absolute: 0.1 10*3/uL (ref 0.0–0.1)
Basophils Relative: 1 %
Eosinophils Absolute: 0.3 10*3/uL (ref 0.0–0.5)
Eosinophils Relative: 4 %
HCT: 36.6 % — ABNORMAL LOW (ref 39.0–52.0)
Hemoglobin: 12.2 g/dL — ABNORMAL LOW (ref 13.0–17.0)
Immature Granulocytes: 1 %
Lymphocytes Relative: 15 %
Lymphs Abs: 1.1 10*3/uL (ref 0.7–4.0)
MCH: 33.2 pg (ref 26.0–34.0)
MCHC: 33.3 g/dL (ref 30.0–36.0)
MCV: 99.7 fL (ref 80.0–100.0)
Monocytes Absolute: 0.6 10*3/uL (ref 0.1–1.0)
Monocytes Relative: 8 %
Neutro Abs: 5.1 10*3/uL (ref 1.7–7.7)
Neutrophils Relative %: 71 %
Platelet Count: 283 10*3/uL (ref 150–400)
RBC: 3.67 MIL/uL — ABNORMAL LOW (ref 4.22–5.81)
RDW: 13.4 % (ref 11.5–15.5)
WBC Count: 7.2 10*3/uL (ref 4.0–10.5)
nRBC: 0 % (ref 0.0–0.2)

## 2023-08-24 LAB — TSH: TSH: 3.035 u[IU]/mL (ref 0.350–4.500)

## 2023-08-24 MED ORDER — SODIUM CHLORIDE 0.9% FLUSH
10.0000 mL | INTRAVENOUS | Status: DC | PRN
Start: 2023-08-24 — End: 2023-08-24
  Administered 2023-08-24: 10 mL
  Filled 2023-08-24: qty 10

## 2023-08-24 MED ORDER — HEPARIN SOD (PORK) LOCK FLUSH 100 UNIT/ML IV SOLN
500.0000 [IU] | Freq: Once | INTRAVENOUS | Status: AC | PRN
Start: 2023-08-24 — End: 2023-08-24
  Administered 2023-08-24: 500 [IU]
  Filled 2023-08-24: qty 5

## 2023-08-24 MED ORDER — SODIUM CHLORIDE 0.9 % IV SOLN
Freq: Once | INTRAVENOUS | Status: AC
Start: 2023-08-24 — End: 2023-08-24
  Filled 2023-08-24: qty 250

## 2023-08-24 MED ORDER — SODIUM CHLORIDE 0.9 % IV SOLN
10.0000 mg/kg | Freq: Once | INTRAVENOUS | Status: AC
  Administered 2023-08-24: 740 mg via INTRAVENOUS
  Filled 2023-08-24: qty 4.8

## 2023-08-24 NOTE — Patient Instructions (Signed)
[facility] - A DEPT OF [facility]  Discharge Instructions: Thank you for choosing [facility] to provide your oncology and hematology care.  If you have a lab appointment with the Cancer Center, please go directly to the Cancer Center and check in at the registration area.  Wear comfortable clothing and clothing appropriate for easy access to any Portacath or PICC line.   We strive to give you quality time with your provider. You may need to reschedule your appointment if you arrive late (15 or more minutes).  Arriving late affects you and other patients whose appointments are after yours.  Also, if you miss three or more appointments without notifying the office, you may be dismissed from the clinic at the provider's discretion.      For prescription refill requests, have your pharmacy contact our office and allow 72 hours for refills to be completed.    Today you received the following chemotherapy and/or immunotherapy agents Durvalumab      To help prevent nausea and vomiting after your treatment, we encourage you to take your nausea medication as directed.  BELOW ARE SYMPTOMS THAT SHOULD BE REPORTED IMMEDIATELY: *FEVER GREATER THAN 100.4 F (38 C) OR HIGHER *CHILLS OR SWEATING *NAUSEA AND VOMITING THAT IS NOT CONTROLLED WITH YOUR NAUSEA MEDICATION *UNUSUAL SHORTNESS OF BREATH *UNUSUAL BRUISING OR BLEEDING *URINARY PROBLEMS (pain or burning when urinating, or frequent urination) *BOWEL PROBLEMS (unusual diarrhea, constipation, pain near the anus) TENDERNESS IN MOUTH AND THROAT WITH OR WITHOUT PRESENCE OF ULCERS (sore throat, sores in mouth, or a toothache) UNUSUAL RASH, SWELLING OR PAIN  UNUSUAL VAGINAL DISCHARGE OR ITCHING   Items with * indicate a potential emergency and should be followed up as soon as possible or go to the Emergency Department if any problems should occur.  Please show the CHEMOTHERAPY ALERT CARD or IMMUNOTHERAPY  ALERT CARD at check-in to the Emergency Department and triage nurse.  Should you have questions after your visit or need to cancel or reschedule your appointment, please contact [facility] - A DEPT OF [facility]  [phone] and follow the prompts.  Office hours are 8:00 a.m. to 4:30 p.m. Monday - Friday. Please note that voicemails left after 4:00 p.m. may not be returned until the following business day.  We are closed weekends and major holidays. You have access to a nurse at all times for urgent questions. Please call the main number to the clinic [phone] and follow the prompts.  For any non-urgent questions, you may also contact your provider using MyChart. We now offer e-Visits for anyone 36 and older to request care online for non-urgent symptoms. For details visit [email or web address].   Also download the MyChart app! Go to the app store, search "MyChart", open the app, select [place], and log in with your MyChart username and password.

## 2023-08-24 NOTE — Progress Notes (Signed)
[facility]  [phone]) [phone] Fax:(336) [phone]  ID: Ethan Fitzgerald OB: 1956/07/04  MR#: [id number]  ION#:[id number]  Patient Care Team: Smitty Cords, DO as PCP - General (Family Medicine) Micki Riley, MD as Referring Physician (Neurology) Glory Buff, RN as Oncology Nurse Navigator Orlie Dakin, Tollie Pizza, MD as Consulting Physician (Oncology)  CHIEF COMPLAINT: Stage IIIa adenocarcinoma of the right upper lobe lung.  INTERVAL HISTORY: Patient returns to clinic today for further evaluation, discussion of his imaging results, and consideration of cycle 6 of maintenance durvalumab.  He is tolerating his treatments well without significant side effects.  He currently feels well and is asymptomatic.  He has a fair appetite.  He does not complain of pain today.  He has no new neurologic complaints.  He denies any recent fevers or illnesses. He denies any chest pain, shortness of breath, cough, or hemoptysis.  He has no nausea, vomiting, constipation, or diarrhea.  He has no urinary complaints.  Patient offers no specific complaints today.  REVIEW OF SYSTEMS:   Review of Systems  Constitutional: Negative.  Negative for fever, malaise/fatigue and weight loss.  Respiratory: Negative.  Negative for cough, hemoptysis and shortness of breath.   Cardiovascular: Negative.  Negative for chest pain and leg swelling.  Gastrointestinal: Negative.  Negative for abdominal pain.  Genitourinary: Negative.  Negative for dysuria.  Musculoskeletal: Negative.  Negative for back pain.  Skin: Negative.  Negative for rash.  Neurological: Negative.  Negative for dizziness, focal weakness, weakness and headaches.  Psychiatric/Behavioral: Negative.  The patient is not nervous/anxious.   All other systems reviewed and are negative.   As per HPI. Otherwise, a complete review of systems is negative.  PAST MEDICAL HISTORY: Past Medical History:  Diagnosis Date   COPD (chronic  obstructive pulmonary disease) (HCC)    mild   COVID-19 09/2019   Depression    Erectile dysfunction    Headache    daily, stress headaches   Knee pain, left    Seasonal allergies    Stroke (HCC)    Tobacco dependence     PAST SURGICAL HISTORY: Past Surgical History:  Procedure Laterality Date   BACK SURGERY     metal plate in back   COLONOSCOPY     COLONOSCOPY WITH PROPOFOL N/A 04/26/2019   Procedure: COLONOSCOPY WITH PROPOFOL;  Surgeon: Wyline Mood, MD;  Location: [facility] ENDOSCOPY;  Service: Gastroenterology;  Laterality: N/A;   FINE NEEDLE ASPIRATION  03/03/2023   Procedure: FINE NEEDLE ASPIRATION;  Surgeon: Raechel Chute, MD;  Location: [facility] ENDOSCOPY;  Service: Pulmonary;;   HERNIA REPAIR Right    IR ANGIO INTRA EXTRACRAN SEL COM CAROTID INNOMINATE UNI L MOD SED  03/27/2020   IR ANGIO VERTEBRAL SEL SUBCLAVIAN INNOMINATE UNI L MOD SED  03/27/2020   IR ANGIO VERTEBRAL SEL VERTEBRAL UNI R MOD SED  03/27/2020   IR CT HEAD LTD  03/27/2020   IR IMAGING GUIDED PORT INSERTION  03/29/2023   IR PERCUTANEOUS ART THROMBECTOMY/INFUSION INTRACRANIAL INC DIAG ANGIO  03/27/2020   KNEE ARTHROSCOPY Left 03/28/2015   Procedure: Arthroscopic partial medial meniscectomy plus chondral debridement;  Surgeon: Erin Sons, MD;  Location: [facility];  Service: Orthopedics;  Laterality: Left;   RADIOLOGY WITH ANESTHESIA N/A 03/27/2020   Procedure: IR WITH ANESTHESIA;  Surgeon: Julieanne Cotton, MD;  Location: [facility] OR;  Service: Radiology;  Laterality: N/A;   VIDEO BRONCHOSCOPY WITH ENDOBRONCHIAL ULTRASOUND  03/03/2023   Procedure: VIDEO BRONCHOSCOPY WITH ENDOBRONCHIAL ULTRASOUND;  Surgeon: Raechel Chute,  MD;  Location: [facility] ENDOSCOPY;  Service: Pulmonary;;    FAMILY HISTORY: Family History  Problem Relation Age of Onset   Diabetes Mother    Heart attack Mother 4   Cancer Father        liver    COPD Brother    HIV/AIDS Brother    Prostate cancer Neg Hx    Colon cancer Neg Hx      ADVANCED DIRECTIVES (Y/N):  N  HEALTH MAINTENANCE: Social History   Tobacco Use   Smoking status: Former    Current packs/day: 0.00    Average packs/day: 1.5 packs/day for 49.0 years (73.5 ttl pk-yrs)    Types: Cigarettes    Start date: 70    Quit date: 09/14/2017    Years since quitting: 5.9   Smokeless tobacco: Former  Building services engineer status: Never Used  Substance Use Topics   Alcohol use: Not Currently    Comment: 6 pack on weekend   Drug use: No     Colonoscopy:  PAP:  Bone density:  Lipid panel:  No Known Allergies  Current Outpatient Medications  Medication Sig Dispense Refill   albuterol (VENTOLIN HFA) 108 (90 Base) MCG/ACT inhaler Inhale 2 puffs into the lungs every 4 (four) hours as needed for wheezing or shortness of breath. 1 each 2   aspirin EC 325 MG EC tablet Take 1 tablet (325 mg total) by mouth daily. 90 tablet 0   b complex vitamins capsule Take 1 capsule by mouth daily.     Cholecalciferol (VITAMIN D) 50 MCG (2000 UT) tablet Take 2,000 Units by mouth daily.     fluticasone (FLONASE) 50 MCG/ACT nasal spray Place 2 sprays into both nostrils daily. (Patient taking differently: Place 2 sprays into both nostrils daily as needed for allergies.) 48 g 3   melatonin 5 MG TABS Take 5 mg by mouth at bedtime as needed (sleep).     midodrine (PROAMATINE) 5 MG tablet TAKE 1 TABLET BY MOUTH 3 TIMES DAILY WITH MEALS. 270 tablet 1   Multiple Vitamin (MULTIVITAMIN WITH MINERALS) TABS tablet Take 1 tablet by mouth daily.     Multiple Vitamins-Minerals (IMMUNE SUPPORT VITAMIN C PO) Take 1 tablet by mouth daily.     naproxen (NAPROSYN) 500 MG tablet Take 1 tablet (500 mg total) by mouth 2 (two) times daily with a meal. 10 tablet 0   Oxycodone HCl 10 MG TABS Take 1 tablet (10 mg total) by mouth every 6 (six) hours as needed. Take with stool softeners to avoid constipation 120 tablet 0   polyethylene glycol (MIRALAX) 17 g packet Take 17 g by mouth 2 (two) times daily as  needed for mild constipation or moderate constipation (Hold if diarrhea.). 100 each 2   predniSONE (DELTASONE) 10 MG tablet Take 1 tablet (10 mg total) by mouth daily. 30 tablet 0   rosuvastatin (CRESTOR) 40 MG tablet Take 1 tablet (40 mg total) by mouth every other day. 45 tablet 3   senna (SENOKOT) 8.6 MG TABS tablet Take 1-2 tablets (8.6-17.2 mg total) by mouth 2 (two) times daily as needed for mild constipation or moderate constipation (Hold if diarrhea). 120 tablet 0   sildenafil (REVATIO) 20 MG tablet TAKE 1 TO 5 TABLETS BY MOUTH ABOUT 30 MINUTES PRIOR TO SEX.START WITH 1 THEN INCREASE 30 tablet 5   STIOLTO RESPIMAT 2.5-2.5 MCG/ACT AERS INHALE 2 PUFFS EVERY DAY 12 g 3   sucralfate (CARAFATE) 1 g tablet Take 1 tablet (1  g total) by mouth 3 (three) times daily before meals. 90 tablet 1   No current facility-administered medications for this visit.   Facility-Administered Medications Ordered in Other Visits  Medication Dose Route Frequency Provider Last Rate Last Admin   sodium chloride flush (NS) 0.9 % injection 10 mL  10 mL Intracatheter PRN Jeralyn Ruths, MD   10 mL at 08/24/23 1147    OBJECTIVE: Vitals:   08/24/23 0846  BP: 121/70  Pulse: 97  Resp: 16  Temp: (!) 96.4 F (35.8 C)  SpO2: 100%      Body mass index is 20.75 kg/m.    ECOG FS:0 - Asymptomatic  General: Well-developed, well-nourished, no acute distress. Eyes: Pink conjunctiva, anicteric sclera. HEENT: Normocephalic, moist mucous membranes. Lungs: No audible wheezing or coughing. Heart: Regular rate and rhythm. Abdomen: Soft, nontender, no obvious distention. Musculoskeletal: No edema, cyanosis, or clubbing. Neuro: Alert, answering all questions appropriately. Cranial nerves grossly intact. Skin: No rashes or petechiae noted. Psych: Normal affect.  LAB RESULTS:  Lab Results  Component Value Date   NA 137 08/24/2023   K 4.0 08/24/2023   CL 108 08/24/2023   CO2 20 (L) 08/24/2023   GLUCOSE 147 (H)  08/24/2023   BUN 19 08/24/2023   CREATININE 1.22 08/24/2023   CALCIUM 9.7 08/24/2023   PROT 6.7 08/24/2023   ALBUMIN 3.6 08/24/2023   AST 33 08/24/2023   ALT 14 08/24/2023   ALKPHOS 51 08/24/2023   BILITOT 1.0 08/24/2023   GFRNONAA >60 08/24/2023   GFRAA 85 12/22/2020    Lab Results  Component Value Date   WBC 7.2 08/24/2023   NEUTROABS 5.1 08/24/2023   HGB 12.2 (L) 08/24/2023   HCT 36.6 (L) 08/24/2023   MCV 99.7 08/24/2023   PLT 283 08/24/2023     STUDIES: NM PET Image Restag (PS) Skull Base To Thigh Result Date: 08/24/2023 CLINICAL DATA:  Subsequent treatment strategy for lung cancer. EXAM: NUCLEAR MEDICINE PET SKULL BASE TO THIGH TECHNIQUE: 8.6 mCi F-18 FDG was injected intravenously. Full-ring PET imaging was performed from the skull base to thigh after the radiotracer. CT data was obtained and used for attenuation correction and anatomic localization. Fasting blood glucose: 98 mg/dl COMPARISON:  CT chest 40/98/1191, 03/02/2023 and PET 02/14/2023. FINDINGS: Mediastinal blood pool activity: SUV max 2.2 Liver activity: SUV max NA NECK: No abnormal hypermetabolism. Incidental CT findings: None. CHEST: Decrease in size and peripheral hypermetabolism associated with a 4.2 x 5.2 cm mass in the posterior segment right upper lobe, SUV max 10.2, previously 4.7 x 5.9 cm and SUV max 18.0. Similar hypermetabolic right hilar lymph node, SUV max 3.5. Mild hypermetabolism associated with new subpleural ground-glass in the anterior right upper lobe (6/52), likely infectious/inflammatory in etiology. Otherwise, no abnormal hypermetabolism. Incidental CT findings: Left IJ Port-A-Cath terminates in the right atrium. Centrilobular and paraseptal emphysema. Similar vague peribronchovascular ground-glass in the superior segment left lower lobe (6/64), likely infectious/inflammatory in etiology. ABDOMEN/PELVIS: No abnormal hypermetabolism. Incidental CT findings: Gallstones. Punctate left renal stone.  Atherosclerotic calcification of the aorta. SKELETON: No abnormal hypermetabolism. Incidental CT findings: Postoperative changes in the lumbar spine. Degenerative changes in the spine. IMPRESSION: 1. Interval response to therapy as evidenced by decrease in size and peripheral hypermetabolism of a right lower lobe mass. Stable mildly hypermetabolic right hilar lymph. No evidence of disease progression. 2. Peribronchovascular ground-glass in the right upper and left lower lobes, likely infectious/inflammatory in etiology. 3. Cholelithiasis. 4. Punctate left renal stone. 5.  Aortic atherosclerosis (ICD10-I70.0).  6.  Emphysema (ICD10-J43.9). Electronically Signed   By: Leanna Battles M.D.   On: 08/24/2023 09:57    ASSESSMENT: Stage IIIa adenocarcinoma of the right upper lobe lung.  PLAN:    Stage IIIa adenocarcinoma of the right upper lobe lung: Pathology and imaging reviewed independently confirming diagnosis and stage of disease.  PET scan results from February 14, 2023 reviewed independently with no obvious evidence of metastatic disease.  MRI of the brain from February 11, 2023 also negative for metastatic disease.  Surgical resection was not an option, therefore patient proceeded with concurrent XRT along with weekly carboplatin and Taxol. This will be followed by maintenance durvalumab every 2 weeks for 1 year.  Patient has had port placement.  Patient completed concurrent XRT and chemotherapy on May 27, 2023.  Repeat PET scan on August 24, 2023 reviewed independently and report as above with significant improvement of disease burden.  Patient has some residual hypermetabolism, therefore will repeat PET scan in March 2025.  Proceed with cycle 6 of maintenance durvalumab today.  Return to clinic in 2 weeks for treatment only and then in 4 weeks for further evaluation and consideration of cycle 8.   Chest pain: Patient does not complain of this today.  Continue oxycodone as needed. Iron deficiency anemia:  Essentially resolved.  Patient's hemoglobin is 12.2 today. He last received IV Venofer on May 25, 2023.  Patient expressed understanding and was in agreement with this plan. He also understands that He can call clinic at any time with any questions, concerns, or complaints.    Cancer Staging  Adenocarcinoma of upper lobe of right lung [facility]) Staging form: Lung, AJCC 8th Edition - Clinical stage from 03/15/2023: Stage IIIA (cT4, cN0, cM0) - Signed by Jeralyn Ruths, MD on 03/15/2023 Stage prefix: Initial diagnosis   Jeralyn Ruths, MD   08/24/2023 1:12 PM

## 2023-08-25 LAB — T4: T4, Total: 6.7 ug/dL (ref 4.5–12.0)

## 2023-09-08 ENCOUNTER — Inpatient Hospital Stay: Payer: Medicare HMO | Attending: Oncology

## 2023-09-08 VITALS — BP 134/90 | HR 88 | Temp 98.0°F | Resp 17

## 2023-09-08 DIAGNOSIS — Z7952 Long term (current) use of systemic steroids: Secondary | ICD-10-CM | POA: Diagnosis not present

## 2023-09-08 DIAGNOSIS — Z87891 Personal history of nicotine dependence: Secondary | ICD-10-CM | POA: Diagnosis not present

## 2023-09-08 DIAGNOSIS — Z5112 Encounter for antineoplastic immunotherapy: Secondary | ICD-10-CM | POA: Diagnosis not present

## 2023-09-08 DIAGNOSIS — R079 Chest pain, unspecified: Secondary | ICD-10-CM | POA: Diagnosis not present

## 2023-09-08 DIAGNOSIS — N529 Male erectile dysfunction, unspecified: Secondary | ICD-10-CM | POA: Diagnosis not present

## 2023-09-08 DIAGNOSIS — R42 Dizziness and giddiness: Secondary | ICD-10-CM | POA: Diagnosis not present

## 2023-09-08 DIAGNOSIS — J449 Chronic obstructive pulmonary disease, unspecified: Secondary | ICD-10-CM | POA: Insufficient documentation

## 2023-09-08 DIAGNOSIS — C3411 Malignant neoplasm of upper lobe, right bronchus or lung: Secondary | ICD-10-CM | POA: Diagnosis not present

## 2023-09-08 DIAGNOSIS — Z8616 Personal history of COVID-19: Secondary | ICD-10-CM | POA: Diagnosis not present

## 2023-09-08 DIAGNOSIS — Z791 Long term (current) use of non-steroidal anti-inflammatories (NSAID): Secondary | ICD-10-CM | POA: Insufficient documentation

## 2023-09-08 DIAGNOSIS — Z800 Family history of malignant neoplasm of digestive organs: Secondary | ICD-10-CM | POA: Diagnosis not present

## 2023-09-08 DIAGNOSIS — Z8673 Personal history of transient ischemic attack (TIA), and cerebral infarction without residual deficits: Secondary | ICD-10-CM | POA: Diagnosis not present

## 2023-09-08 DIAGNOSIS — Z79899 Other long term (current) drug therapy: Secondary | ICD-10-CM | POA: Diagnosis not present

## 2023-09-08 MED ORDER — SODIUM CHLORIDE 0.9% FLUSH
10.0000 mL | INTRAVENOUS | Status: DC | PRN
  Administered 2023-09-08: 10 mL
  Filled 2023-09-08: qty 10

## 2023-09-08 MED ORDER — SODIUM CHLORIDE 0.9 % IV SOLN
10.0000 mg/kg | Freq: Once | INTRAVENOUS | Status: AC
  Administered 2023-09-08: 740 mg via INTRAVENOUS
  Filled 2023-09-08: qty 4.8

## 2023-09-08 MED ORDER — SODIUM CHLORIDE 0.9 % IV SOLN
Freq: Once | INTRAVENOUS | Status: AC
  Filled 2023-09-08: qty 250

## 2023-09-08 MED ORDER — HEPARIN SOD (PORK) LOCK FLUSH 100 UNIT/ML IV SOLN
500.0000 [IU] | Freq: Once | INTRAVENOUS | Status: AC | PRN
  Administered 2023-09-08: 500 [IU]
  Filled 2023-09-08: qty 5

## 2023-09-08 NOTE — Patient Instructions (Signed)
 [facility] - A DEPT OF [facility]  Discharge Instructions: Thank you for choosing [facility] to provide your oncology and hematology care.  If you have a lab appointment with the Cancer Center, please go directly to the Cancer Center and check in at the registration area.  Wear comfortable clothing and clothing appropriate for easy access to any Portacath or PICC line.   We strive to give you quality time with your provider. You may need to reschedule your appointment if you arrive late (15 or more minutes).  Arriving late affects you and other patients whose appointments are after yours.  Also, if you miss three or more appointments without notifying the office, you may be dismissed from the clinic at the provider's discretion.      For prescription refill requests, have your pharmacy contact our office and allow 72 hours for refills to be completed.    Today you received the following chemotherapy and/or immunotherapy agents durvalumab      To help prevent nausea and vomiting after your treatment, we encourage you to take your nausea medication as directed.  BELOW ARE SYMPTOMS THAT SHOULD BE REPORTED IMMEDIATELY: *FEVER GREATER THAN 100.4 F (38 C) OR HIGHER *CHILLS OR SWEATING *NAUSEA AND VOMITING THAT IS NOT CONTROLLED WITH YOUR NAUSEA MEDICATION *UNUSUAL SHORTNESS OF BREATH *UNUSUAL BRUISING OR BLEEDING *URINARY PROBLEMS (pain or burning when urinating, or frequent urination) *BOWEL PROBLEMS (unusual diarrhea, constipation, pain near the anus) TENDERNESS IN MOUTH AND THROAT WITH OR WITHOUT PRESENCE OF ULCERS (sore throat, sores in mouth, or a toothache) UNUSUAL RASH, SWELLING OR PAIN  UNUSUAL VAGINAL DISCHARGE OR ITCHING   Items with * indicate a potential emergency and should be followed up as soon as possible or go to the Emergency Department if any problems should occur.  Please show the CHEMOTHERAPY ALERT CARD or IMMUNOTHERAPY  ALERT CARD at check-in to the Emergency Department and triage nurse.  Should you have questions after your visit or need to cancel or reschedule your appointment, please contact [facility] - A DEPT OF [facility]  [phone] and follow the prompts.  Office hours are 8:00 a.m. to 4:30 p.m. Monday - Friday. Please note that voicemails left after 4:00 p.m. may not be returned until the following business day.  We are closed weekends and major holidays. You have access to a nurse at all times for urgent questions. Please call the main number to the clinic [phone] and follow the prompts.  For any non-urgent questions, you may also contact your provider using MyChart. We now offer e-Visits for anyone 82 and older to request care online for non-urgent symptoms. For details visit [email or web address].   Also download the MyChart app! Go to the app store, search "MyChart", open the app, select [place], and log in with your MyChart username and password.

## 2023-09-10 ENCOUNTER — Other Ambulatory Visit: Payer: Self-pay | Admitting: Family Medicine

## 2023-09-10 DIAGNOSIS — J432 Centrilobular emphysema: Secondary | ICD-10-CM

## 2023-09-12 NOTE — Telephone Encounter (Signed)
 Requested medication (s) are due for refill today - unsure  Requested medication (s) are on the active medication list -yes  Future visit scheduled -no  Last refill: 06/28/23 12g 3RF  Notes to clinic: off protocol- provider review   Requested Prescriptions  Pending Prescriptions Disp Refills   STIOLTO RESPIMAT  2.5-2.5 MCG/ACT AERS [Pharmacy Med Name: Stiolto Respimat  Inhalation Aerosol Solution 2.5-2.5 MCG/ACT] 12 g 3    Sig: INHALE 2 PUFFS EVERY DAY     Off-Protocol Failed - 09/12/2023 11:50 AM      Failed - Medication not assigned to a protocol, review manually.      Passed - Valid encounter within last 12 months    Recent Outpatient Visits           7 months ago Right-sided chest wall pain   [place] [facility] [place], Angeline ORN, NP   8 months ago Annual physical exam   [place] [facility] [place] Marsa PARAS, DO   10 months ago Orthostatic hypotension   [place] [facility] [place] Marsa PARAS, DO   12 months ago Acute right-sided low back pain with right-sided sciatica   [place] [facility] [place], Angeline ORN, NP   1 year ago Centrilobular emphysema [facility])   [place] [facility] Delles, Sharyle LABOR, RPH-CPP                 Requested Prescriptions  Pending Prescriptions Disp Refills   STIOLTO RESPIMAT  2.5-2.5 MCG/ACT AERS [Pharmacy Med Name: Stiolto Respimat  Inhalation Aerosol Solution 2.5-2.5 MCG/ACT] 12 g 3    Sig: INHALE 2 PUFFS EVERY DAY     Off-Protocol Failed - 09/12/2023 11:50 AM      Failed - Medication not assigned to a protocol, review manually.      Passed - Valid encounter within last 12 months    Recent Outpatient Visits           7 months ago Right-sided chest wall pain   [place] [facility] [place], Angeline ORN, NP   8 months ago Annual physical exam   [place] [facility] [place] Marsa PARAS, DO   10  months ago Orthostatic hypotension   [place] [facility] [place] Marsa PARAS, DO   12 months ago Acute right-sided low back pain with right-sided sciatica   [place] [facility] [place], Angeline ORN, NP   1 year ago Centrilobular emphysema [facility])   [place] [facility] Delles, Sharyle LABOR, RPH-CPP

## 2023-09-21 ENCOUNTER — Encounter: Payer: Self-pay | Admitting: Oncology

## 2023-09-21 ENCOUNTER — Inpatient Hospital Stay: Payer: Medicare HMO

## 2023-09-21 ENCOUNTER — Inpatient Hospital Stay: Payer: Medicare HMO | Admitting: Oncology

## 2023-09-21 VITALS — BP 136/89

## 2023-09-21 VITALS — BP 145/109 | HR 87 | Temp 97.1°F | Resp 16 | Ht 71.0 in | Wt 158.9 lb

## 2023-09-21 DIAGNOSIS — C3411 Malignant neoplasm of upper lobe, right bronchus or lung: Secondary | ICD-10-CM

## 2023-09-21 DIAGNOSIS — Z79899 Other long term (current) drug therapy: Secondary | ICD-10-CM | POA: Diagnosis not present

## 2023-09-21 DIAGNOSIS — R42 Dizziness and giddiness: Secondary | ICD-10-CM | POA: Diagnosis not present

## 2023-09-21 DIAGNOSIS — R079 Chest pain, unspecified: Secondary | ICD-10-CM | POA: Diagnosis not present

## 2023-09-21 DIAGNOSIS — Z791 Long term (current) use of non-steroidal anti-inflammatories (NSAID): Secondary | ICD-10-CM | POA: Diagnosis not present

## 2023-09-21 DIAGNOSIS — Z7952 Long term (current) use of systemic steroids: Secondary | ICD-10-CM | POA: Diagnosis not present

## 2023-09-21 DIAGNOSIS — J449 Chronic obstructive pulmonary disease, unspecified: Secondary | ICD-10-CM | POA: Diagnosis not present

## 2023-09-21 DIAGNOSIS — N529 Male erectile dysfunction, unspecified: Secondary | ICD-10-CM | POA: Diagnosis not present

## 2023-09-21 DIAGNOSIS — Z5112 Encounter for antineoplastic immunotherapy: Secondary | ICD-10-CM | POA: Diagnosis not present

## 2023-09-21 LAB — TSH: TSH: 1.619 u[IU]/mL (ref 0.350–4.500)

## 2023-09-21 LAB — CBC WITH DIFFERENTIAL (CANCER CENTER ONLY)
Abs Immature Granulocytes: 0.04 10*3/uL (ref 0.00–0.07)
Basophils Absolute: 0 10*3/uL (ref 0.0–0.1)
Basophils Relative: 1 %
Eosinophils Absolute: 0.3 10*3/uL (ref 0.0–0.5)
Eosinophils Relative: 4 %
HCT: 39 % (ref 39.0–52.0)
Hemoglobin: 13.2 g/dL (ref 13.0–17.0)
Immature Granulocytes: 1 %
Lymphocytes Relative: 17 %
Lymphs Abs: 1 10*3/uL (ref 0.7–4.0)
MCH: 32.8 pg (ref 26.0–34.0)
MCHC: 33.8 g/dL (ref 30.0–36.0)
MCV: 97 fL (ref 80.0–100.0)
Monocytes Absolute: 0.6 10*3/uL (ref 0.1–1.0)
Monocytes Relative: 11 %
Neutro Abs: 4 10*3/uL (ref 1.7–7.7)
Neutrophils Relative %: 66 %
Platelet Count: 227 10*3/uL (ref 150–400)
RBC: 4.02 MIL/uL — ABNORMAL LOW (ref 4.22–5.81)
RDW: 13.2 % (ref 11.5–15.5)
WBC Count: 6 10*3/uL (ref 4.0–10.5)
nRBC: 0 % (ref 0.0–0.2)

## 2023-09-21 LAB — CMP (CANCER CENTER ONLY)
ALT: 21 U/L (ref 0–44)
AST: 26 U/L (ref 15–41)
Albumin: 3.8 g/dL (ref 3.5–5.0)
Alkaline Phosphatase: 68 U/L (ref 38–126)
Anion gap: 7 (ref 5–15)
BUN: 18 mg/dL (ref 8–23)
CO2: 24 mmol/L (ref 22–32)
Calcium: 10.1 mg/dL (ref 8.9–10.3)
Chloride: 107 mmol/L (ref 98–111)
Creatinine: 0.85 mg/dL (ref 0.61–1.24)
GFR, Estimated: >60 mL/min (ref >60)
Glucose, Bld: 90 mg/dL (ref 70–99)
Potassium: 3.8 mmol/L (ref 3.5–5.1)
Sodium: 138 mmol/L (ref 135–145)
Total Bilirubin: 0.8 mg/dL (ref 0.0–1.2)
Total Protein: 7 g/dL (ref 6.5–8.1)

## 2023-09-21 MED ORDER — SODIUM CHLORIDE 0.9 % IV SOLN
Freq: Once | INTRAVENOUS | Status: AC
Start: 2023-09-21 — End: 2023-09-21
  Filled 2023-09-21: qty 250

## 2023-09-21 MED ORDER — DURVALUMAB 500 MG/10ML IV SOLN
10.0000 mg/kg | Freq: Once | INTRAVENOUS | Status: AC
  Administered 2023-09-21: 740 mg via INTRAVENOUS
  Filled 2023-09-21: qty 4.8

## 2023-09-21 MED ORDER — HEPARIN SOD (PORK) LOCK FLUSH 100 UNIT/ML IV SOLN
500.0000 [IU] | Freq: Once | INTRAVENOUS | Status: AC | PRN
  Administered 2023-09-21: 500 [IU]
  Filled 2023-09-21: qty 5

## 2023-09-21 NOTE — Patient Instructions (Signed)
 [facility] - A DEPT OF [facility]  Discharge Instructions: Thank you for choosing [facility] to provide your oncology and hematology care.  If you have a lab appointment with the Cancer Center, please go directly to the Cancer Center and check in at the registration area.  Wear comfortable clothing and clothing appropriate for easy access to any Portacath or PICC line.   We strive to give you quality time with your provider. You may need to reschedule your appointment if you arrive late (15 or more minutes).  Arriving late affects you and other patients whose appointments are after yours.  Also, if you miss three or more appointments without notifying the office, you may be dismissed from the clinic at the provider's discretion.      For prescription refill requests, have your pharmacy contact our office and allow 72 hours for refills to be completed.    Today you received the following chemotherapy and/or immunotherapy agents IMFINZI      To help prevent nausea and vomiting after your treatment, we encourage you to take your nausea medication as directed.  BELOW ARE SYMPTOMS THAT SHOULD BE REPORTED IMMEDIATELY: *FEVER GREATER THAN 100.4 F (38 C) OR HIGHER *CHILLS OR SWEATING *NAUSEA AND VOMITING THAT IS NOT CONTROLLED WITH YOUR NAUSEA MEDICATION *UNUSUAL SHORTNESS OF BREATH *UNUSUAL BRUISING OR BLEEDING *URINARY PROBLEMS (pain or burning when urinating, or frequent urination) *BOWEL PROBLEMS (unusual diarrhea, constipation, pain near the anus) TENDERNESS IN MOUTH AND THROAT WITH OR WITHOUT PRESENCE OF ULCERS (sore throat, sores in mouth, or a toothache) UNUSUAL RASH, SWELLING OR PAIN  UNUSUAL VAGINAL DISCHARGE OR ITCHING   Items with * indicate a potential emergency and should be followed up as soon as possible or go to the Emergency Department if any problems should occur.  Please show the CHEMOTHERAPY ALERT CARD or IMMUNOTHERAPY  ALERT CARD at check-in to the Emergency Department and triage nurse.  Should you have questions after your visit or need to cancel or reschedule your appointment, please contact [facility] - A DEPT OF [facility]  [phone] and follow the prompts.  Office hours are 8:00 a.m. to 4:30 p.m. Monday - Friday. Please note that voicemails left after 4:00 p.m. may not be returned until the following business day.  We are closed weekends and major holidays. You have access to a nurse at all times for urgent questions. Please call the main number to the clinic [phone] and follow the prompts.  For any non-urgent questions, you may also contact your provider using MyChart. We now offer e-Visits for anyone 30 and older to request care online for non-urgent symptoms. For details visit [email or web address].   Also download the MyChart app! Go to the app store, search "MyChart", open the app, select [facility], and log in with your MyChart username and password.  Durvalumab Injection What is this medication? DURVALUMAB (dur VAL ue mab) treats some types of cancer. It works by helping your immune system slow or stop the spread of cancer cells. It is a monoclonal antibody. This medicine may be used for other purposes; ask your health care provider or pharmacist if you have questions. COMMON BRAND NAME(S): IMFINZI What should I tell my care team before I take this medication? They need to know if you have any of these conditions: Allogeneic stem cell transplant (uses someone else's stem cells) Autoimmune diseases, such as Crohn disease, ulcerative  colitis, lupus History of chest radiation Nervous system problems, such as Guillain-Barre syndrome, myasthenia gravis Organ transplant An unusual or allergic reaction to durvalumab, other medications, foods, dyes, or preservatives Pregnant or trying to get pregnant Breast-feeding How should I use this medication? This  medication is infused into a vein. It is given by your care team in a hospital or clinic setting. A special MedGuide will be given to you before each treatment. Be sure to read this information carefully each time. Talk to your care team about the use of this medication in children. Special care may be needed. Overdosage: If you think you have taken too much of this medicine contact a poison control center or emergency room at once. NOTE: This medicine is only for you. Do not share this medicine with others. What if I miss a dose? Keep appointments for follow-up doses. It is important not to miss your dose. Call your care team if you are unable to keep an appointment. What may interact with this medication? Interactions have not been studied. This list may not describe all possible interactions. Give your health care provider a list of all the medicines, herbs, non-prescription drugs, or dietary supplements you use. Also tell them if you smoke, drink alcohol, or use illegal drugs. Some items may interact with your medicine. What should I watch for while using this medication? Your condition will be monitored carefully while you are receiving this medication. You may need blood work while taking this medication. This medication may cause serious skin reactions. They can happen weeks to months after starting the medication. Contact your care team right away if you notice fevers or flu-like symptoms with a rash. The rash may be red or purple and then turn into blisters or peeling of the skin. You may also notice a red rash with swelling of the face, lips, or lymph nodes in your neck or under your arms. Tell your care team right away if you have any change in your eyesight. Talk to your care team if you may be pregnant. Serious birth defects can occur if you take this medication during pregnancy and for 3 months after the last dose. You will need a negative pregnancy test before starting this medication.  Contraception is recommended while taking this medication and for 3 months after the last dose. Your care team can help you find the option that works for you. Do not breastfeed while taking this medication and for 3 months after the last dose. What side effects may I notice from receiving this medication? Side effects that you should report to your care team as soon as possible: Allergic reactions--skin rash, itching, hives, swelling of the face, lips, tongue, or throat Dry cough, shortness of breath or trouble breathing Eye pain, redness, irritation, or discharge with blurry or decreased vision Heart muscle inflammation--unusual weakness or fatigue, shortness of breath, chest pain, fast or irregular heartbeat, dizziness, swelling of the ankles, feet, or hands Hormone gland problems--headache, sensitivity to light, unusual weakness or fatigue, dizziness, fast or irregular heartbeat, increased sensitivity to cold or heat, excessive sweating, constipation, hair loss, increased thirst or amount of urine, tremors or shaking, irritability Infusion reactions--chest pain, shortness of breath or trouble breathing, feeling faint or lightheaded Kidney injury (glomerulonephritis)--decrease in the amount of urine, red or dark brown urine, foamy or bubbly urine, swelling of the ankles, hands, or feet Liver injury--right upper belly pain, loss of appetite, nausea, light-colored stool, dark yellow or brown urine, yellowing skin or eyes,  unusual weakness or fatigue Pain, tingling, or numbness in the hands or feet, muscle weakness, change in vision, confusion or trouble speaking, loss of balance or coordination, trouble walking, seizures Rash, fever, and swollen lymph nodes Redness, blistering, peeling, or loosening of the skin, including inside the mouth Sudden or severe stomach pain, bloody diarrhea, fever, nausea, vomiting Side effects that usually do not require medical attention (report these to your care team  if they continue or are bothersome): Bone, joint, or muscle pain Diarrhea Fatigue Loss of appetite Nausea Skin rash This list may not describe all possible side effects. Call your doctor for medical advice about side effects. You may report side effects to FDA at 1-800-FDA-1088. Where should I keep my medication? This medication is given in a hospital or clinic. It will not be stored at home. NOTE: This sheet is a summary. It may not cover all possible information. If you have questions about this medicine, talk to your doctor, pharmacist, or health care provider.  2024 Elsevier/Gold Standard (2022-01-05 00:00:00)

## 2023-09-21 NOTE — Progress Notes (Signed)
 [facility]  [phone] Fax:([phone]) [phone]  ID: [facility] OB: [birth year]  MR#: [id number]  AOZ#:[id number]  Patient Care Team: Raina Bunting, DO as PCP - General (Family Medicine) Lisabeth Rider, MD as Referring Physician (Neurology) Drake Gens, RN as Oncology Nurse Navigator Adrian Alba, Deadra Everts, MD as Consulting Physician (Oncology)  CHIEF COMPLAINT: Stage IIIa adenocarcinoma of the right upper lobe lung.  INTERVAL HISTORY: Patient returns to clinic today for further evaluation and consideration of cycle 8 of maintenance durvalumab .  He has chronic dizziness, but otherwise feels well.  He is tolerating his treatments without significant side effects.  He has a fair appetite.  He does not complain of pain today.  He has no new neurologic complaints.  He denies any recent fevers or illnesses. He denies any chest pain, shortness of breath, cough, or hemoptysis.  He has no nausea, vomiting, constipation, or diarrhea.  He has no urinary complaints.  Patient has no further specific complaints today.  REVIEW OF SYSTEMS:   Review of Systems  Constitutional: Negative.  Negative for fever, malaise/fatigue and weight loss.  Respiratory: Negative.  Negative for cough, hemoptysis and shortness of breath.   Cardiovascular: Negative.  Negative for chest pain and leg swelling.  Gastrointestinal: Negative.  Negative for abdominal pain.  Genitourinary: Negative.  Negative for dysuria.  Musculoskeletal: Negative.  Negative for back pain.  Skin: Negative.  Negative for rash.  Neurological:  Positive for dizziness. Negative for focal weakness, weakness and headaches.  Psychiatric/Behavioral: Negative.  The patient is not nervous/anxious.   All other systems reviewed and are negative.   As per HPI. Otherwise, a complete review of systems is negative.  PAST MEDICAL HISTORY: Past Medical History:  Diagnosis Date   COPD (chronic obstructive pulmonary  disease) (HCC)    mild   COVID-19 09/2019   Depression    Erectile dysfunction    Headache    daily, stress headaches   Knee pain, left    Seasonal allergies    Stroke (HCC)    Tobacco dependence     PAST SURGICAL HISTORY: Past Surgical History:  Procedure Laterality Date   BACK SURGERY     metal plate in back   COLONOSCOPY     COLONOSCOPY WITH PROPOFOL  N/A 04/26/2019   Procedure: COLONOSCOPY WITH PROPOFOL ;  Surgeon: Luke Salaam, MD;  Location: [facility];  Service: Gastroenterology;  Laterality: N/A;   FINE NEEDLE ASPIRATION  03/03/2023   Procedure: FINE NEEDLE ASPIRATION;  Surgeon: Vergia Glasgow, MD;  Location: [facility] ENDOSCOPY;  Service: Pulmonary;;   HERNIA REPAIR Right    IR ANGIO INTRA EXTRACRAN SEL COM CAROTID INNOMINATE UNI L MOD SED  03/27/2020   IR ANGIO VERTEBRAL SEL SUBCLAVIAN INNOMINATE UNI L MOD SED  03/27/2020   IR ANGIO VERTEBRAL SEL VERTEBRAL UNI R MOD SED  03/27/2020   IR CT HEAD LTD  03/27/2020   IR IMAGING GUIDED PORT INSERTION  03/29/2023   IR PERCUTANEOUS ART THROMBECTOMY/INFUSION INTRACRANIAL INC DIAG ANGIO  03/27/2020   KNEE ARTHROSCOPY Left 03/28/2015   Procedure: Arthroscopic partial medial meniscectomy plus chondral debridement;  Surgeon: Josephus Nida, MD;  Location: [facility];  Service: Orthopedics;  Laterality: Left;   RADIOLOGY WITH ANESTHESIA N/A 03/27/2020   Procedure: IR WITH ANESTHESIA;  Surgeon: Luellen Sages, MD;  Location: [facility] OR;  Service: Radiology;  Laterality: N/A;   VIDEO BRONCHOSCOPY WITH ENDOBRONCHIAL ULTRASOUND  03/03/2023   Procedure: VIDEO BRONCHOSCOPY WITH ENDOBRONCHIAL ULTRASOUND;  Surgeon: Vergia Glasgow, MD;  Location:  [facility] ENDOSCOPY;  Service: Pulmonary;;    FAMILY HISTORY: Family History  Problem Relation Age of Onset   Diabetes Mother    Heart attack Mother 47   Cancer Father        liver    COPD Brother    HIV/AIDS Brother    Prostate cancer Neg Hx    Colon cancer Neg Hx     ADVANCED DIRECTIVES  (Y/N):  N  HEALTH MAINTENANCE: Social History   Tobacco Use   Smoking status: Former    Current packs/day: 0.00    Average packs/day: 1.5 packs/day for 49.0 years (73.5 ttl pk-yrs)    Types: Cigarettes    Start date: 61    Quit date: 09/14/2017    Years since quitting: 6.0   Smokeless tobacco: Former  Building services engineer status: Never Used  Substance Use Topics   Alcohol use: Not Currently    Comment: 6 pack on weekend   Drug use: No     Colonoscopy:  PAP:  Bone density:  Lipid panel:  No Known Allergies  Current Outpatient Medications  Medication Sig Dispense Refill   albuterol  (VENTOLIN  HFA) 108 (90 Base) MCG/ACT inhaler Inhale 2 puffs into the lungs every 4 (four) hours as needed for wheezing or shortness of breath. 1 each 2   b complex vitamins capsule Take 1 capsule by mouth daily.     Cholecalciferol (VITAMIN D ) 50 MCG (2000 UT) tablet Take 2,000 Units by mouth daily.     fluticasone  (FLONASE ) 50 MCG/ACT nasal spray Place 2 sprays into both nostrils daily. (Patient taking differently: Place 2 sprays into both nostrils daily as needed for allergies.) 48 g 3   melatonin 5 MG TABS Take 5 mg by mouth at bedtime as needed (sleep).     midodrine  (PROAMATINE ) 5 MG tablet TAKE 1 TABLET BY MOUTH 3 TIMES DAILY WITH MEALS. 270 tablet 1   Multiple Vitamin (MULTIVITAMIN WITH MINERALS) TABS tablet Take 1 tablet by mouth daily.     Multiple Vitamins-Minerals (IMMUNE SUPPORT VITAMIN C PO) Take 1 tablet by mouth daily.     naproxen  (NAPROSYN ) 500 MG tablet Take 1 tablet (500 mg total) by mouth 2 (two) times daily with a meal. 10 tablet 0   Oxycodone  HCl 10 MG TABS Take 1 tablet (10 mg total) by mouth every 6 (six) hours as needed. Take with stool softeners to avoid constipation 120 tablet 0   polyethylene glycol (MIRALAX ) 17 g packet Take 17 g by mouth 2 (two) times daily as needed for mild constipation or moderate constipation (Hold if diarrhea.). 100 each 2   rosuvastatin  (CRESTOR )  40 MG tablet Take 1 tablet (40 mg total) by mouth every other day. 45 tablet 3   senna (SENOKOT) 8.6 MG TABS tablet Take 1-2 tablets (8.6-17.2 mg total) by mouth 2 (two) times daily as needed for mild constipation or moderate constipation (Hold if diarrhea). 120 tablet 0   sildenafil  (REVATIO ) 20 MG tablet TAKE 1 TO 5 TABLETS BY MOUTH ABOUT 30 MINUTES PRIOR TO SEX.START WITH 1 THEN INCREASE 30 tablet 5   STIOLTO RESPIMAT  2.5-2.5 MCG/ACT AERS INHALE 2 PUFFS EVERY DAY 12 g 3   sucralfate  (CARAFATE ) 1 g tablet Take 1 tablet (1 g total) by mouth 3 (three) times daily before meals. 90 tablet 1   aspirin  EC 325 MG EC tablet Take 1 tablet (325 mg total) by mouth daily. (Patient not taking: Reported on 09/21/2023) 90 tablet 0   predniSONE  (  DELTASONE ) 10 MG tablet Take 1 tablet (10 mg total) by mouth daily. (Patient not taking: Reported on 09/21/2023) 30 tablet 0   No current facility-administered medications for this visit.    OBJECTIVE: Vitals:   09/21/23 0929  BP: (!) 145/109  Pulse: 87  Resp: 16  Temp: (!) 97.1 F (36.2 C)  SpO2: 100%       Body mass index is 22.16 kg/m.    ECOG FS:0 - Asymptomatic  General: Well-developed, well-nourished, no acute distress. Eyes: Pink conjunctiva, anicteric sclera. HEENT: Normocephalic, moist mucous membranes. Lungs: No audible wheezing or coughing. Heart: Regular rate and rhythm. Abdomen: Soft, nontender, no obvious distention. Musculoskeletal: No edema, cyanosis, or clubbing. Neuro: Alert, answering all questions appropriately. Cranial nerves grossly intact. Skin: No rashes or petechiae noted. Psych: Normal affect.  LAB RESULTS:  Lab Results  Component Value Date   NA 138 09/21/2023   K 3.8 09/21/2023   CL 107 09/21/2023   CO2 24 09/21/2023   GLUCOSE 90 09/21/2023   BUN 18 09/21/2023   CREATININE 0.85 09/21/2023   CALCIUM  10.1 09/21/2023   PROT 7.0 09/21/2023   ALBUMIN 3.8 09/21/2023   AST 26 09/21/2023   ALT 21 09/21/2023   ALKPHOS 68  09/21/2023   BILITOT 0.8 09/21/2023   GFRNONAA >60 09/21/2023   GFRAA 85 12/22/2020    Lab Results  Component Value Date   WBC 6.0 09/21/2023   NEUTROABS 4.0 09/21/2023   HGB 13.2 09/21/2023   HCT 39.0 09/21/2023   MCV 97.0 09/21/2023   PLT 227 09/21/2023     STUDIES: No results found.   ASSESSMENT: Stage IIIa adenocarcinoma of the right upper lobe lung.  PLAN:    Stage IIIa adenocarcinoma of the right upper lobe lung: Pathology and imaging reviewed independently confirming diagnosis and stage of disease.  PET scan results from February 14, 2023 reviewed independently with no obvious evidence of metastatic disease.  MRI of the brain from February 11, 2023 also negative for metastatic disease.  Surgical resection was not an option, therefore patient proceeded with concurrent XRT along with weekly carboplatin  and Taxol . This will be followed by maintenance durvalumab  every 2 weeks for 1 year.  Patient has had port placement.  Patient completed concurrent XRT and chemotherapy on May 27, 2023.  Repeat PET scan on August 17, 2023 reviewed independently with significant improvement of disease burden.  Patient has some residual hypermetabolism, therefore will repeat PET scan in March 2025.  Proceed with cycle 8 of maintenance durvalumab  today.  Return to clinic in 2 weeks for treatment only and in 4 weeks for further evaluation and consideration of cycle 10.   Chest pain: Patient does not complain of this today.  Continue oxycodone  as needed. Iron  deficiency anemia: Resolved.  He last received IV Venofer  on May 25, 2023. Dizziness: Patient has requested a referral to ENT for further evaluation. History of CVA: Patient has self discontinued aspirin  325 daily secondary to bruising.  Have instructed patient to discuss with his cardiologist and consider initiating 81 mg aspirin  daily.  Patient expressed understanding and was in agreement with this plan. He also understands that He can call  clinic at any time with any questions, concerns, or complaints.    Cancer Staging  Adenocarcinoma of upper lobe of right lung [facility]) Staging form: Lung, AJCC 8th Edition - Clinical stage from 03/15/2023: Stage IIIA (cT4, cN0, cM0) - Signed by Shellie Dials, MD on 03/15/2023 Stage prefix: Initial diagnosis   Deadra Everts  Adrian Alba, MD   09/21/2023 12:59 PM

## 2023-09-22 LAB — T4: T4, Total: 6.5 ug/dL (ref 4.5–12.0)

## 2023-10-05 ENCOUNTER — Inpatient Hospital Stay: Payer: Medicare HMO

## 2023-10-05 VITALS — BP 127/80 | HR 71 | Temp 97.7°F | Resp 16 | Wt 158.0 lb

## 2023-10-05 DIAGNOSIS — C3411 Malignant neoplasm of upper lobe, right bronchus or lung: Secondary | ICD-10-CM | POA: Diagnosis not present

## 2023-10-05 DIAGNOSIS — R079 Chest pain, unspecified: Secondary | ICD-10-CM | POA: Diagnosis not present

## 2023-10-05 DIAGNOSIS — J449 Chronic obstructive pulmonary disease, unspecified: Secondary | ICD-10-CM | POA: Diagnosis not present

## 2023-10-05 DIAGNOSIS — Z791 Long term (current) use of non-steroidal anti-inflammatories (NSAID): Secondary | ICD-10-CM | POA: Diagnosis not present

## 2023-10-05 DIAGNOSIS — Z5112 Encounter for antineoplastic immunotherapy: Secondary | ICD-10-CM | POA: Diagnosis not present

## 2023-10-05 DIAGNOSIS — R42 Dizziness and giddiness: Secondary | ICD-10-CM | POA: Diagnosis not present

## 2023-10-05 DIAGNOSIS — Z79899 Other long term (current) drug therapy: Secondary | ICD-10-CM | POA: Diagnosis not present

## 2023-10-05 DIAGNOSIS — N529 Male erectile dysfunction, unspecified: Secondary | ICD-10-CM | POA: Diagnosis not present

## 2023-10-05 DIAGNOSIS — Z7952 Long term (current) use of systemic steroids: Secondary | ICD-10-CM | POA: Diagnosis not present

## 2023-10-05 MED ORDER — SODIUM CHLORIDE 0.9 % IV SOLN
Freq: Once | INTRAVENOUS | Status: AC
  Filled 2023-10-05: qty 250

## 2023-10-05 MED ORDER — HEPARIN SOD (PORK) LOCK FLUSH 100 UNIT/ML IV SOLN
500.0000 [IU] | Freq: Once | INTRAVENOUS | Status: AC | PRN
Start: 2023-10-05 — End: 2023-10-05
  Administered 2023-10-05: 500 [IU]
  Filled 2023-10-05: qty 5

## 2023-10-05 MED ORDER — SODIUM CHLORIDE 0.9 % IV SOLN
10.0000 mg/kg | Freq: Once | INTRAVENOUS | Status: AC
Start: 2023-10-05 — End: 2023-10-05
  Administered 2023-10-05: 740 mg via INTRAVENOUS
  Filled 2023-10-05: qty 4.8

## 2023-10-05 MED ORDER — SODIUM CHLORIDE 0.9% FLUSH
10.0000 mL | INTRAVENOUS | Status: DC | PRN
  Administered 2023-10-05: 10 mL via INTRAVENOUS
  Filled 2023-10-05: qty 10

## 2023-10-05 NOTE — Patient Instructions (Signed)
[facility] - A DEPT OF [facility]  Discharge Instructions: Thank you for choosing [facility] to provide your oncology and hematology care.  If you have a lab appointment with the Cancer Center, please go directly to the Cancer Center and check in at the registration area.  Wear comfortable clothing and clothing appropriate for easy access to any Portacath or PICC line.   We strive to give you quality time with your provider. You may need to reschedule your appointment if you arrive late (15 or more minutes).  Arriving late affects you and other patients whose appointments are after yours.  Also, if you miss three or more appointments without notifying the office, you may be dismissed from the clinic at the provider's discretion.      For prescription refill requests, have your pharmacy contact our office and allow 72 hours for refills to be completed.    Today you received the following chemotherapy and/or immunotherapy agents IMFINZI      To help prevent nausea and vomiting after your treatment, we encourage you to take your nausea medication as directed.  BELOW ARE SYMPTOMS THAT SHOULD BE REPORTED IMMEDIATELY: *FEVER GREATER THAN 100.4 F (38 C) OR HIGHER *CHILLS OR SWEATING *NAUSEA AND VOMITING THAT IS NOT CONTROLLED WITH YOUR NAUSEA MEDICATION *UNUSUAL SHORTNESS OF BREATH *UNUSUAL BRUISING OR BLEEDING *URINARY PROBLEMS (pain or burning when urinating, or frequent urination) *BOWEL PROBLEMS (unusual diarrhea, constipation, pain near the anus) TENDERNESS IN MOUTH AND THROAT WITH OR WITHOUT PRESENCE OF ULCERS (sore throat, sores in mouth, or a toothache) UNUSUAL RASH, SWELLING OR PAIN  UNUSUAL VAGINAL DISCHARGE OR ITCHING   Items with * indicate a potential emergency and should be followed up as soon as possible or go to the Emergency Department if any problems should occur.  Please show the CHEMOTHERAPY ALERT CARD or IMMUNOTHERAPY  ALERT CARD at check-in to the Emergency Department and triage nurse.  Should you have questions after your visit or need to cancel or reschedule your appointment, please contact [facility] - A DEPT OF [facility]  [phone] and follow the prompts.  Office hours are 8:00 a.m. to 4:30 p.m. Monday - Friday. Please note that voicemails left after 4:00 p.m. may not be returned until the following business day.  We are closed weekends and major holidays. You have access to a nurse at all times for urgent questions. Please call the main number to the clinic [phone] and follow the prompts.  For any non-urgent questions, you may also contact your provider using MyChart. We now offer e-Visits for anyone 30 and older to request care online for non-urgent symptoms. For details visit [email or web address].   Also download the MyChart app! Go to the app store, search "MyChart", open the app, select [place], and log in with your MyChart username and password.  Durvalumab Injection What is this medication? DURVALUMAB (dur VAL ue mab) treats some types of cancer. It works by helping your immune system slow or stop the spread of cancer cells. It is a monoclonal antibody. This medicine may be used for other purposes; ask your health care provider or pharmacist if you have questions. COMMON BRAND NAME(S): IMFINZI What should I tell my care team before I take this medication? They need to know if you have any of these conditions: Allogeneic stem cell transplant (uses someone else's stem cells) Autoimmune diseases, such as Crohn disease, ulcerative  colitis, lupus History of chest radiation Nervous system problems, such as Guillain-Barre syndrome, myasthenia gravis Organ transplant An unusual or allergic reaction to durvalumab, other medications, foods, dyes, or preservatives Pregnant or trying to get pregnant Breast-feeding How should I use this medication? This  medication is infused into a vein. It is given by your care team in a hospital or clinic setting. A special MedGuide will be given to you before each treatment. Be sure to read this information carefully each time. Talk to your care team about the use of this medication in children. Special care may be needed. Overdosage: If you think you have taken too much of this medicine contact a poison control center or emergency room at once. NOTE: This medicine is only for you. Do not share this medicine with others. What if I miss a dose? Keep appointments for follow-up doses. It is important not to miss your dose. Call your care team if you are unable to keep an appointment. What may interact with this medication? Interactions have not been studied. This list may not describe all possible interactions. Give your health care provider a list of all the medicines, herbs, non-prescription drugs, or dietary supplements you use. Also tell them if you smoke, drink alcohol, or use illegal drugs. Some items may interact with your medicine. What should I watch for while using this medication? Your condition will be monitored carefully while you are receiving this medication. You may need blood work while taking this medication. This medication may cause serious skin reactions. They can happen weeks to months after starting the medication. Contact your care team right away if you notice fevers or flu-like symptoms with a rash. The rash may be red or purple and then turn into blisters or peeling of the skin. You may also notice a red rash with swelling of the face, lips, or lymph nodes in your neck or under your arms. Tell your care team right away if you have any change in your eyesight. Talk to your care team if you may be pregnant. Serious birth defects can occur if you take this medication during pregnancy and for 3 months after the last dose. You will need a negative pregnancy test before starting this medication.  Contraception is recommended while taking this medication and for 3 months after the last dose. Your care team can help you find the option that works for you. Do not breastfeed while taking this medication and for 3 months after the last dose. What side effects may I notice from receiving this medication? Side effects that you should report to your care team as soon as possible: Allergic reactions--skin rash, itching, hives, swelling of the face, lips, tongue, or throat Dry cough, shortness of breath or trouble breathing Eye pain, redness, irritation, or discharge with blurry or decreased vision Heart muscle inflammation--unusual weakness or fatigue, shortness of breath, chest pain, fast or irregular heartbeat, dizziness, swelling of the ankles, feet, or hands Hormone gland problems--headache, sensitivity to light, unusual weakness or fatigue, dizziness, fast or irregular heartbeat, increased sensitivity to cold or heat, excessive sweating, constipation, hair loss, increased thirst or amount of urine, tremors or shaking, irritability Infusion reactions--chest pain, shortness of breath or trouble breathing, feeling faint or lightheaded Kidney injury (glomerulonephritis)--decrease in the amount of urine, red or dark brown urine, foamy or bubbly urine, swelling of the ankles, hands, or feet Liver injury--right upper belly pain, loss of appetite, nausea, light-colored stool, dark yellow or brown urine, yellowing skin or eyes,  unusual weakness or fatigue Pain, tingling, or numbness in the hands or feet, muscle weakness, change in vision, confusion or trouble speaking, loss of balance or coordination, trouble walking, seizures Rash, fever, and swollen lymph nodes Redness, blistering, peeling, or loosening of the skin, including inside the mouth Sudden or severe stomach pain, bloody diarrhea, fever, nausea, vomiting Side effects that usually do not require medical attention (report these to your care team  if they continue or are bothersome): Bone, joint, or muscle pain Diarrhea Fatigue Loss of appetite Nausea Skin rash This list may not describe all possible side effects. Call your doctor for medical advice about side effects. You may report side effects to FDA at 1-800-FDA-1088. Where should I keep my medication? This medication is given in a hospital or clinic. It will not be stored at home. NOTE: This sheet is a summary. It may not cover all possible information. If you have questions about this medicine, talk to your doctor, pharmacist, or health care provider.  2024 Elsevier/Gold Standard (2022-01-05 00:00:00)

## 2023-10-11 ENCOUNTER — Other Ambulatory Visit: Payer: Self-pay

## 2023-10-14 ENCOUNTER — Ambulatory Visit
Admission: RE | Admit: 2023-10-14 | Discharge: 2023-10-14 | Disposition: A | Discharge status: Home / Self Care | Payer: Medicare HMO | Source: Ambulatory Visit | Attending: Radiation Oncology | Admitting: Radiation Oncology

## 2023-10-14 DIAGNOSIS — C3490 Malignant neoplasm of unspecified part of unspecified bronchus or lung: Secondary | ICD-10-CM | POA: Diagnosis not present

## 2023-10-14 DIAGNOSIS — J439 Emphysema, unspecified: Secondary | ICD-10-CM | POA: Diagnosis not present

## 2023-10-14 DIAGNOSIS — C3411 Malignant neoplasm of upper lobe, right bronchus or lung: Secondary | ICD-10-CM | POA: Insufficient documentation

## 2023-10-14 DIAGNOSIS — I3139 Other pericardial effusion (noninflammatory): Secondary | ICD-10-CM | POA: Diagnosis not present

## 2023-10-14 MED ORDER — IOHEXOL 300 MG/ML  SOLN
80.0000 mL | Freq: Once | INTRAMUSCULAR | Status: AC | PRN
  Administered 2023-10-14: 80 mL via INTRAVENOUS

## 2023-10-19 ENCOUNTER — Inpatient Hospital Stay: Payer: Medicare HMO | Attending: Oncology

## 2023-10-19 ENCOUNTER — Inpatient Hospital Stay: Payer: Medicare HMO

## 2023-10-19 ENCOUNTER — Inpatient Hospital Stay: Payer: Medicare HMO | Admitting: Oncology

## 2023-10-19 ENCOUNTER — Telehealth: Payer: Self-pay | Admitting: *Deleted

## 2023-10-19 ENCOUNTER — Encounter: Payer: Self-pay | Admitting: Oncology

## 2023-10-19 VITALS — BP 128/82 | HR 77 | Temp 97.8°F | Wt 160.8 lb

## 2023-10-19 VITALS — BP 147/95 | HR 71 | Temp 96.1°F | Resp 17

## 2023-10-19 DIAGNOSIS — Z7952 Long term (current) use of systemic steroids: Secondary | ICD-10-CM | POA: Insufficient documentation

## 2023-10-19 DIAGNOSIS — R42 Dizziness and giddiness: Secondary | ICD-10-CM | POA: Diagnosis not present

## 2023-10-19 DIAGNOSIS — D509 Iron deficiency anemia, unspecified: Secondary | ICD-10-CM | POA: Diagnosis not present

## 2023-10-19 DIAGNOSIS — Z5112 Encounter for antineoplastic immunotherapy: Secondary | ICD-10-CM | POA: Insufficient documentation

## 2023-10-19 DIAGNOSIS — R079 Chest pain, unspecified: Secondary | ICD-10-CM | POA: Insufficient documentation

## 2023-10-19 DIAGNOSIS — Z7189 Other specified counseling: Secondary | ICD-10-CM | POA: Diagnosis not present

## 2023-10-19 DIAGNOSIS — Z79899 Other long term (current) drug therapy: Secondary | ICD-10-CM | POA: Diagnosis not present

## 2023-10-19 DIAGNOSIS — J449 Chronic obstructive pulmonary disease, unspecified: Secondary | ICD-10-CM | POA: Insufficient documentation

## 2023-10-19 DIAGNOSIS — C3411 Malignant neoplasm of upper lobe, right bronchus or lung: Secondary | ICD-10-CM | POA: Diagnosis not present

## 2023-10-19 DIAGNOSIS — Z791 Long term (current) use of non-steroidal anti-inflammatories (NSAID): Secondary | ICD-10-CM | POA: Diagnosis not present

## 2023-10-19 DIAGNOSIS — Z8616 Personal history of COVID-19: Secondary | ICD-10-CM | POA: Diagnosis not present

## 2023-10-19 DIAGNOSIS — Z87891 Personal history of nicotine dependence: Secondary | ICD-10-CM | POA: Diagnosis not present

## 2023-10-19 DIAGNOSIS — Z8673 Personal history of transient ischemic attack (TIA), and cerebral infarction without residual deficits: Secondary | ICD-10-CM | POA: Insufficient documentation

## 2023-10-19 LAB — CBC WITH DIFFERENTIAL (CANCER CENTER ONLY)
Abs Immature Granulocytes: 0.04 10*3/uL (ref 0.00–0.07)
Basophils Absolute: 0.1 10*3/uL (ref 0.0–0.1)
Basophils Relative: 1 %
Eosinophils Absolute: 0.4 10*3/uL (ref 0.0–0.5)
Eosinophils Relative: 5 %
HCT: 38.4 % — ABNORMAL LOW (ref 39.0–52.0)
Hemoglobin: 13 g/dL (ref 13.0–17.0)
Immature Granulocytes: 1 %
Lymphocytes Relative: 19 %
Lymphs Abs: 1.3 10*3/uL (ref 0.7–4.0)
MCH: 31.7 pg (ref 26.0–34.0)
MCHC: 33.9 g/dL (ref 30.0–36.0)
MCV: 93.7 fL (ref 80.0–100.0)
Monocytes Absolute: 0.6 10*3/uL (ref 0.1–1.0)
Monocytes Relative: 9 %
Neutro Abs: 4.6 10*3/uL (ref 1.7–7.7)
Neutrophils Relative %: 65 %
Platelet Count: 212 10*3/uL (ref 150–400)
RBC: 4.1 MIL/uL — ABNORMAL LOW (ref 4.22–5.81)
RDW: 13.4 % (ref 11.5–15.5)
WBC Count: 7 10*3/uL (ref 4.0–10.5)
nRBC: 0 % (ref 0.0–0.2)

## 2023-10-19 LAB — CMP (CANCER CENTER ONLY)
ALT: 19 U/L (ref 0–44)
AST: 34 U/L (ref 15–41)
Albumin: 3.6 g/dL (ref 3.5–5.0)
Alkaline Phosphatase: 70 U/L (ref 38–126)
Anion gap: 8 (ref 5–15)
BUN: 12 mg/dL (ref 8–23)
CO2: 21 mmol/L — ABNORMAL LOW (ref 22–32)
Calcium: 9.4 mg/dL (ref 8.9–10.3)
Chloride: 108 mmol/L (ref 98–111)
Creatinine: 0.77 mg/dL (ref 0.61–1.24)
GFR, Estimated: >60 mL/min (ref >60)
Glucose, Bld: 158 mg/dL — ABNORMAL HIGH (ref 70–99)
Potassium: 3.4 mmol/L — ABNORMAL LOW (ref 3.5–5.1)
Sodium: 137 mmol/L (ref 135–145)
Total Bilirubin: 0.9 mg/dL (ref 0.0–1.2)
Total Protein: 6.5 g/dL (ref 6.5–8.1)

## 2023-10-19 LAB — TSH: TSH: 2.157 u[IU]/mL (ref 0.350–4.500)

## 2023-10-19 MED ORDER — SODIUM CHLORIDE 0.9 % IV SOLN
10.0000 mg/kg | Freq: Once | INTRAVENOUS | Status: AC
  Administered 2023-10-19: 740 mg via INTRAVENOUS
  Filled 2023-10-19: qty 4.8

## 2023-10-19 MED ORDER — SODIUM CHLORIDE 0.9 % IV SOLN
Freq: Once | INTRAVENOUS | Status: AC
Start: 2023-10-19 — End: 2023-10-19
  Filled 2023-10-19: qty 250

## 2023-10-19 MED ORDER — HEPARIN SOD (PORK) LOCK FLUSH 100 UNIT/ML IV SOLN
500.0000 [IU] | Freq: Once | INTRAVENOUS | Status: AC | PRN
  Administered 2023-10-19: 500 [IU]
  Filled 2023-10-19: qty 5

## 2023-10-19 NOTE — Progress Notes (Signed)
[facility]  Telephone:(336) [phone] Fax:(336) [phone]  ID: PETR BONTEMPO OB: 1956-01-23  MR#: [id number]  QIO#:[id number]  Patient Care Team: Smitty Cords, DO as PCP - General (Family Medicine) Micki Riley, MD as Referring Physician (Neurology) Glory Buff, RN as Oncology Nurse Navigator Orlie Dakin, Tollie Pizza, MD as Consulting Physician (Oncology)  CHIEF COMPLAINT: Stage IIIa adenocarcinoma of the right upper lobe lung.  INTERVAL HISTORY: Patient returns to clinic today for further evaluation and consideration of cycle 10 of maintenance durvalumab.  He currently feels well and is asymptomatic.  He is not complaining of dizziness today.  He is tolerating his treatments well without significant side effects. He has a fair appetite.  He does not complain of pain today.  He has no new neurologic complaints.  He denies any recent fevers or illnesses. He denies any chest pain, shortness of breath, cough, or hemoptysis.  He has no nausea, vomiting, constipation, or diarrhea.  He has no urinary complaints.  Patient offers no further specific complaints today.  REVIEW OF SYSTEMS:   Review of Systems  Constitutional: Negative.  Negative for fever, malaise/fatigue and weight loss.  Respiratory: Negative.  Negative for cough, hemoptysis and shortness of breath.   Cardiovascular: Negative.  Negative for chest pain and leg swelling.  Gastrointestinal: Negative.  Negative for abdominal pain.  Genitourinary: Negative.  Negative for dysuria.  Musculoskeletal: Negative.  Negative for back pain.  Skin: Negative.  Negative for rash.  Neurological:  Positive for dizziness. Negative for focal weakness, weakness and headaches.  Psychiatric/Behavioral: Negative.  The patient is not nervous/anxious.   All other systems reviewed and are negative.   As per HPI. Otherwise, a complete review of systems is negative.  PAST MEDICAL HISTORY: Past Medical History:  Diagnosis  Date   COPD (chronic obstructive pulmonary disease) (HCC)    mild   COVID-19 09/2019   Depression    Erectile dysfunction    Headache    daily, stress headaches   Knee pain, left    Seasonal allergies    Stroke (HCC)    Tobacco dependence     PAST SURGICAL HISTORY: Past Surgical History:  Procedure Laterality Date   BACK SURGERY     metal plate in back   COLONOSCOPY     COLONOSCOPY WITH PROPOFOL N/A 04/26/2019   Procedure: COLONOSCOPY WITH PROPOFOL;  Surgeon: Wyline Mood, MD;  Location: [facility] ENDOSCOPY;  Service: Gastroenterology;  Laterality: N/A;   FINE NEEDLE ASPIRATION  03/03/2023   Procedure: FINE NEEDLE ASPIRATION;  Surgeon: Raechel Chute, MD;  Location: [facility] ENDOSCOPY;  Service: Pulmonary;;   HERNIA REPAIR Right    IR ANGIO INTRA EXTRACRAN SEL COM CAROTID INNOMINATE UNI L MOD SED  03/27/2020   IR ANGIO VERTEBRAL SEL SUBCLAVIAN INNOMINATE UNI L MOD SED  03/27/2020   IR ANGIO VERTEBRAL SEL VERTEBRAL UNI R MOD SED  03/27/2020   IR CT HEAD LTD  03/27/2020   IR IMAGING GUIDED PORT INSERTION  03/29/2023   IR PERCUTANEOUS ART THROMBECTOMY/INFUSION INTRACRANIAL INC DIAG ANGIO  03/27/2020   KNEE ARTHROSCOPY Left 03/28/2015   Procedure: Arthroscopic partial medial meniscectomy plus chondral debridement;  Surgeon: Erin Sons, MD;  Location: [facility];  Service: Orthopedics;  Laterality: Left;   RADIOLOGY WITH ANESTHESIA N/A 03/27/2020   Procedure: IR WITH ANESTHESIA;  Surgeon: Julieanne Cotton, MD;  Location: [facility] OR;  Service: Radiology;  Laterality: N/A;   VIDEO BRONCHOSCOPY WITH ENDOBRONCHIAL ULTRASOUND  03/03/2023   Procedure: VIDEO BRONCHOSCOPY WITH ENDOBRONCHIAL ULTRASOUND;  Surgeon: Raechel Chute, MD;  Location: [facility] ENDOSCOPY;  Service: Pulmonary;;    FAMILY HISTORY: Family History  Problem Relation Age of Onset   Diabetes Mother    Heart attack Mother 34   Cancer Father        liver    COPD Brother    HIV/AIDS Brother    Prostate cancer Neg Hx     Colon cancer Neg Hx     ADVANCED DIRECTIVES (Y/N):  N  HEALTH MAINTENANCE: Social History   Tobacco Use   Smoking status: Former    Current packs/day: 0.00    Average packs/day: 1.5 packs/day for 49.0 years (73.5 ttl pk-yrs)    Types: Cigarettes    Start date: 32    Quit date: 09/14/2017    Years since quitting: 6.0   Smokeless tobacco: Former  Building services engineer status: Never Used  Substance Use Topics   Alcohol use: Not Currently    Comment: 6 pack on weekend   Drug use: No     Colonoscopy:  PAP:  Bone density:  Lipid panel:  No Known Allergies  Current Outpatient Medications  Medication Sig Dispense Refill   albuterol (VENTOLIN HFA) 108 (90 Base) MCG/ACT inhaler Inhale 2 puffs into the lungs every 4 (four) hours as needed for wheezing or shortness of breath. 1 each 2   b complex vitamins capsule Take 1 capsule by mouth daily.     Cholecalciferol (VITAMIN D) 50 MCG (2000 UT) tablet Take 2,000 Units by mouth daily.     fluticasone (FLONASE) 50 MCG/ACT nasal spray Place 2 sprays into both nostrils daily. (Patient taking differently: Place 2 sprays into both nostrils daily as needed for allergies.) 48 g 3   melatonin 5 MG TABS Take 5 mg by mouth at bedtime as needed (sleep).     midodrine (PROAMATINE) 5 MG tablet TAKE 1 TABLET BY MOUTH 3 TIMES DAILY WITH MEALS. 270 tablet 1   Multiple Vitamin (MULTIVITAMIN WITH MINERALS) TABS tablet Take 1 tablet by mouth daily.     Multiple Vitamins-Minerals (IMMUNE SUPPORT VITAMIN C PO) Take 1 tablet by mouth daily.     naproxen (NAPROSYN) 500 MG tablet Take 1 tablet (500 mg total) by mouth 2 (two) times daily with a meal. 10 tablet 0   Oxycodone HCl 10 MG TABS Take 1 tablet (10 mg total) by mouth every 6 (six) hours as needed. Take with stool softeners to avoid constipation 120 tablet 0   polyethylene glycol (MIRALAX) 17 g packet Take 17 g by mouth 2 (two) times daily as needed for mild constipation or moderate constipation (Hold if  diarrhea.). 100 each 2   rosuvastatin (CRESTOR) 40 MG tablet Take 1 tablet (40 mg total) by mouth every other day. 45 tablet 3   senna (SENOKOT) 8.6 MG TABS tablet Take 1-2 tablets (8.6-17.2 mg total) by mouth 2 (two) times daily as needed for mild constipation or moderate constipation (Hold if diarrhea). 120 tablet 0   sildenafil (REVATIO) 20 MG tablet TAKE 1 TO 5 TABLETS BY MOUTH ABOUT 30 MINUTES PRIOR TO SEX.START WITH 1 THEN INCREASE 30 tablet 5   STIOLTO RESPIMAT 2.5-2.5 MCG/ACT AERS INHALE 2 PUFFS EVERY DAY 12 g 3   sucralfate (CARAFATE) 1 g tablet Take 1 tablet (1 g total) by mouth 3 (three) times daily before meals. 90 tablet 1   aspirin EC 325 MG EC tablet Take 1 tablet (325 mg total) by mouth daily. (Patient not taking: Reported on 09/21/2023)  90 tablet 0   predniSONE (DELTASONE) 10 MG tablet Take 1 tablet (10 mg total) by mouth daily. (Patient not taking: Reported on 10/19/2023) 30 tablet 0   No current facility-administered medications for this visit.    OBJECTIVE: Vitals:   10/19/23 0834  BP: 128/82  Pulse: 77  Temp: 97.8 F (36.6 C)  SpO2: 100%       Body mass index is 22.43 kg/m.    ECOG FS:0 - Asymptomatic  General: Well-developed, well-nourished, no acute distress. Eyes: Pink conjunctiva, anicteric sclera. HEENT: Normocephalic, moist mucous membranes. Lungs: No audible wheezing or coughing. Heart: Regular rate and rhythm. Abdomen: Soft, nontender, no obvious distention. Musculoskeletal: No edema, cyanosis, or clubbing. Neuro: Alert, answering all questions appropriately. Cranial nerves grossly intact. Skin: No rashes or petechiae noted. Psych: Normal affect.  LAB RESULTS:  Lab Results  Component Value Date   NA 138 09/21/2023   K 3.8 09/21/2023   CL 107 09/21/2023   CO2 24 09/21/2023   GLUCOSE 90 09/21/2023   BUN 18 09/21/2023   CREATININE 0.85 09/21/2023   CALCIUM 10.1 09/21/2023   PROT 7.0 09/21/2023   ALBUMIN 3.8 09/21/2023   AST 26 09/21/2023    ALT 21 09/21/2023   ALKPHOS 68 09/21/2023   BILITOT 0.8 09/21/2023   GFRNONAA >60 09/21/2023   GFRAA 85 12/22/2020    Lab Results  Component Value Date   WBC 7.0 10/19/2023   NEUTROABS 4.6 10/19/2023   HGB 13.0 10/19/2023   HCT 38.4 (L) 10/19/2023   MCV 93.7 10/19/2023   PLT 212 10/19/2023     STUDIES: No results found.   ASSESSMENT: Stage IIIa adenocarcinoma of the right upper lobe lung.  PLAN:    Stage IIIa adenocarcinoma of the right upper lobe lung: Pathology and imaging reviewed independently confirming diagnosis and stage of disease.  PET scan results from February 14, 2023 reviewed independently with no obvious evidence of metastatic disease.  MRI of the brain from February 11, 2023 also negative for metastatic disease.  Surgical resection was not an option, therefore patient proceeded with concurrent XRT along with weekly carboplatin and Taxol which was completed on May 27, 2023.  Patient initiated his year-long maintenance durvalumab on June 08, 2023.  Repeat PET scan on August 17, 2023 reviewed independently with significant improvement of disease burden.  Patient had a CT scan ordered by radiation oncology completed on October 14, 2023 and results are pending at time of dictation.  Proceed with cycle 10 of maintenance durvalumab today.  Return to clinic in 2 weeks for treatment only and then in 4 weeks for further evaluation and consideration of cycle 12. Chest pain: Patient does not complain of this today.  Continue oxycodone as needed. Iron deficiency anemia: Resolved.  He last received IV Venofer on May 25, 2023. Dizziness: A referral was previously sent to ENT. History of CVA: Patient has self discontinued aspirin 325 daily secondary to bruising.  Have instructed patient to discuss with his cardiologist and consider initiating 81 mg aspirin daily.  I spent a total of 30 minutes reviewing chart data, face-to-face evaluation with the patient, counseling and  coordination of care as detailed above.   Patient expressed understanding and was in agreement with this plan. He also understands that He can call clinic at any time with any questions, concerns, or complaints.    Cancer Staging  Adenocarcinoma of upper lobe of right lung [facility]) Staging form: Lung, AJCC 8th Edition - Clinical stage from 03/15/2023: Stage IIIA (  cT4, cN0, cM0) - Signed by Jeralyn Ruths, MD on 03/15/2023 Stage prefix: Initial diagnosis   Jeralyn Ruths, MD   10/19/2023 8:51 AM

## 2023-10-19 NOTE — Patient Instructions (Signed)
[facility]  Discharge Instructions: Thank you for choosing [facility] to provide your oncology and hematology care.  If you have a lab appointment with the Cancer Center, please go directly to the Cancer Center and check in at the registration area.  Wear comfortable clothing and clothing appropriate for easy access to any Portacath or PICC line.   We strive to give you quality time with your provider. You may need to reschedule your appointment if you arrive late (15 or more minutes).  Arriving late affects you and other patients whose appointments are after yours.  Also, if you miss three or more appointments without notifying the office, you may be dismissed from the clinic at the provider's discretion.      For prescription refill requests, have your pharmacy contact our office and allow 72 hours for refills to be completed.    Today you received the following chemotherapy and/or immunotherapy agents imfinzi      To help prevent nausea and vomiting after your treatment, we encourage you to take your nausea medication as directed.  BELOW ARE SYMPTOMS THAT SHOULD BE REPORTED IMMEDIATELY: *FEVER GREATER THAN 100.4 F (38 C) OR HIGHER *CHILLS OR SWEATING *NAUSEA AND VOMITING THAT IS NOT CONTROLLED WITH YOUR NAUSEA MEDICATION *UNUSUAL SHORTNESS OF BREATH *UNUSUAL BRUISING OR BLEEDING *URINARY PROBLEMS (pain or burning when urinating, or frequent urination) *BOWEL PROBLEMS (unusual diarrhea, constipation, pain near the anus) TENDERNESS IN MOUTH AND THROAT WITH OR WITHOUT PRESENCE OF ULCERS (sore throat, sores in mouth, or a toothache) UNUSUAL RASH, SWELLING OR PAIN  UNUSUAL VAGINAL DISCHARGE OR ITCHING   Items with * indicate a potential emergency and should be followed up as soon as possible or go to the Emergency Department if any problems should occur.  Please show the CHEMOTHERAPY ALERT CARD or IMMUNOTHERAPY  ALERT CARD at check-in to the Emergency Department and triage nurse.  Should you have questions after your visit or need to cancel or reschedule your appointment, please contact [facility] - A DEPT OF [facility]  [phone] and follow the prompts.  Office hours are 8:00 a.m. to 4:30 p.m. Monday - Friday. Please note that voicemails left after 4:00 p.m. may not be returned until the following business day.  We are closed weekends and major holidays. You have access to a nurse at all times for urgent questions. Please call the main number to the clinic [phone] and follow the prompts.  For any non-urgent questions, you may also contact your provider using MyChart. We now offer e-Visits for anyone 4 and older to request care online for non-urgent symptoms. For details visit [email or web address].   Also download the MyChart app! Go to the app store, search "MyChart", open the app, select [place], and log in with your MyChart username and password.

## 2023-10-19 NOTE — Telephone Encounter (Signed)
RN placed call to [facility] to follow up on referral placed, referral was received and [facility] reached out to patient without call being returned. Office will reach out to patient to follow up and schedule.

## 2023-10-20 ENCOUNTER — Ambulatory Visit
Admission: RE | Admit: 2023-10-20 | Discharge: 2023-10-20 | Disposition: A | Discharge status: Home / Self Care | Payer: Medicare HMO | Source: Ambulatory Visit | Attending: Radiation Oncology | Admitting: Radiation Oncology

## 2023-10-20 ENCOUNTER — Encounter: Payer: Self-pay | Admitting: Radiation Oncology

## 2023-10-20 VITALS — BP 121/80 | HR 83 | Temp 97.6°F | Resp 16 | Wt 163.0 lb

## 2023-10-20 DIAGNOSIS — Z87891 Personal history of nicotine dependence: Secondary | ICD-10-CM | POA: Diagnosis not present

## 2023-10-20 DIAGNOSIS — C3411 Malignant neoplasm of upper lobe, right bronchus or lung: Secondary | ICD-10-CM | POA: Insufficient documentation

## 2023-10-20 NOTE — Progress Notes (Signed)
Radiation Oncology Follow up Note  Name: Ethan Fitzgerald   Date:   10/20/2023 MRN:  [id number] DOB: 02/16/1956    This 68 y.o. male presents to the clinic today for 38-month follow-up status post concurrent chemoradiation therapy for stage IIIa (T4 N1 M0) adenocarcinoma of the right lower lobe.  REFERRING PROVIDER: Saralyn Pilar *  HPI: Patient is a 68 year old male now out for months having completed concurrent chemoradiation therapy for stage IIIa adenocarcinoma of the right lower lobe.  He is seen today in routine follow-up is doing well specifically [place] cough hemoptysis chest tightness any change in pulmonary status or dysphagia.  He had a PET CT scan back in December showing.  Excellent response to therapy with decrease size and peripheral hypermetabolic activity of the right lower lobe mass.  He has stable mild hypermetabolic right hilar lymph node.  Also had a recent CT scan this month again showing stable dominant right sided lung mass abutting the hilum with no evidence of progression of disease.  He is currently on Durvalumab immunotherapy which she is tolerating well.  COMPLICATIONS OF TREATMENT: none  FOLLOW UP COMPLIANCE: keeps appointments   PHYSICAL EXAM:  BP 121/80   Pulse 83   Temp 97.6 F (36.4 C) (Temporal)   Resp 16   Wt 163 lb (73.9 kg)   BMI 22.73 kg/m  Well-developed well-nourished patient in NAD. HEENT reveals PERLA, EOMI, discs not visualized.  Oral cavity is clear. No oral mucosal lesions are identified. Neck is clear without evidence of cervical or supraclavicular adenopathy. Lungs are clear to A&P. Cardiac examination is essentially unremarkable with regular rate and rhythm without murmur rub or thrill. Abdomen is benign with no organomegaly or masses noted. Motor sensory and DTR levels are equal and symmetric in the upper and lower extremities. Cranial nerves II through XII are grossly intact. Proprioception is intact. No peripheral adenopathy or edema is  identified. No motor or sensory levels are noted. Crude visual fields are within normal range.  RADIOLOGY RESULTS: CT scan and PET CT scan reviewed compatible with above-stated findings  PLAN: Present time patient is doing well with no evidence of progression of disease currently on immunotherapy with Durvalumab.  I have asked to see him back in 6 months for follow-up.  I assume Dr. Orlie Dakin will have a CT scan ordered around that time if not we will order 1.  Patient knows to call with any concerns.  Clinically doing extremely well.  I would like to take this opportunity to thank you for allowing me to participate in the care of your patient.Carmina Miller, MD

## 2023-10-21 LAB — T4: T4, Total: 6.1 ug/dL (ref 4.5–12.0)

## 2023-10-22 ENCOUNTER — Other Ambulatory Visit: Payer: Self-pay

## 2023-11-02 ENCOUNTER — Inpatient Hospital Stay: Payer: Medicare HMO

## 2023-11-02 VITALS — BP 139/92 | HR 79 | Temp 97.5°F | Resp 18 | Ht 71.0 in | Wt 161.5 lb

## 2023-11-02 DIAGNOSIS — R079 Chest pain, unspecified: Secondary | ICD-10-CM | POA: Diagnosis not present

## 2023-11-02 DIAGNOSIS — Z8673 Personal history of transient ischemic attack (TIA), and cerebral infarction without residual deficits: Secondary | ICD-10-CM | POA: Diagnosis not present

## 2023-11-02 DIAGNOSIS — Z7189 Other specified counseling: Secondary | ICD-10-CM | POA: Diagnosis not present

## 2023-11-02 DIAGNOSIS — Z8616 Personal history of COVID-19: Secondary | ICD-10-CM | POA: Diagnosis not present

## 2023-11-02 DIAGNOSIS — R42 Dizziness and giddiness: Secondary | ICD-10-CM | POA: Diagnosis not present

## 2023-11-02 DIAGNOSIS — C3411 Malignant neoplasm of upper lobe, right bronchus or lung: Secondary | ICD-10-CM | POA: Diagnosis not present

## 2023-11-02 DIAGNOSIS — D509 Iron deficiency anemia, unspecified: Secondary | ICD-10-CM | POA: Diagnosis not present

## 2023-11-02 DIAGNOSIS — Z5112 Encounter for antineoplastic immunotherapy: Secondary | ICD-10-CM | POA: Diagnosis not present

## 2023-11-02 DIAGNOSIS — J449 Chronic obstructive pulmonary disease, unspecified: Secondary | ICD-10-CM | POA: Diagnosis not present

## 2023-11-02 MED ORDER — HEPARIN SOD (PORK) LOCK FLUSH 100 UNIT/ML IV SOLN
500.0000 [IU] | Freq: Once | INTRAVENOUS | Status: AC | PRN
  Administered 2023-11-02: 500 [IU]
  Filled 2023-11-02: qty 5

## 2023-11-02 MED ORDER — SODIUM CHLORIDE 0.9 % IV SOLN
10.0000 mg/kg | Freq: Once | INTRAVENOUS | Status: AC
  Administered 2023-11-02: 740 mg via INTRAVENOUS
  Filled 2023-11-02: qty 4.8

## 2023-11-02 MED ORDER — SODIUM CHLORIDE 0.9 % IV SOLN
Freq: Once | INTRAVENOUS | Status: AC
  Filled 2023-11-02: qty 250

## 2023-11-02 NOTE — Patient Instructions (Signed)
 [facility] - A DEPT OF [facility]  Discharge Instructions: Thank you for choosing [facility] to provide your oncology and hematology care.  If you have a lab appointment with the Cancer Center, please go directly to the Cancer Center and check in at the registration area.  Wear comfortable clothing and clothing appropriate for easy access to any Portacath or PICC line.   We strive to give you quality time with your provider. You may need to reschedule your appointment if you arrive late (15 or more minutes).  Arriving late affects you and other patients whose appointments are after yours.  Also, if you miss three or more appointments without notifying the office, you may be dismissed from the clinic at the provider's discretion.      For prescription refill requests, have your pharmacy contact our office and allow 72 hours for refills to be completed.    Today you received the following chemotherapy and/or immunotherapy agents IMFINZI      To help prevent nausea and vomiting after your treatment, we encourage you to take your nausea medication as directed.  BELOW ARE SYMPTOMS THAT SHOULD BE REPORTED IMMEDIATELY: *FEVER GREATER THAN 100.4 F (38 C) OR HIGHER *CHILLS OR SWEATING *NAUSEA AND VOMITING THAT IS NOT CONTROLLED WITH YOUR NAUSEA MEDICATION *UNUSUAL SHORTNESS OF BREATH *UNUSUAL BRUISING OR BLEEDING *URINARY PROBLEMS (pain or burning when urinating, or frequent urination) *BOWEL PROBLEMS (unusual diarrhea, constipation, pain near the anus) TENDERNESS IN MOUTH AND THROAT WITH OR WITHOUT PRESENCE OF ULCERS (sore throat, sores in mouth, or a toothache) UNUSUAL RASH, SWELLING OR PAIN  UNUSUAL VAGINAL DISCHARGE OR ITCHING   Items with * indicate a potential emergency and should be followed up as soon as possible or go to the Emergency Department if any problems should occur.  Please show the CHEMOTHERAPY ALERT CARD or IMMUNOTHERAPY  ALERT CARD at check-in to the Emergency Department and triage nurse.  Should you have questions after your visit or need to cancel or reschedule your appointment, please contact [facility] - A DEPT OF [facility]  [phone] and follow the prompts.  Office hours are 8:00 a.m. to 4:30 p.m. Monday - Friday. Please note that voicemails left after 4:00 p.m. may not be returned until the following business day.  We are closed weekends and major holidays. You have access to a nurse at all times for urgent questions. Please call the main number to the clinic [phone] and follow the prompts.  For any non-urgent questions, you may also contact your provider using MyChart. We now offer e-Visits for anyone 30 and older to request care online for non-urgent symptoms. For details visit [email or web address].   Also download the MyChart app! Go to the app store, search "MyChart", open the app, select [facility], and log in with your MyChart username and password.  Durvalumab Injection What is this medication? DURVALUMAB (dur VAL ue mab) treats some types of cancer. It works by helping your immune system slow or stop the spread of cancer cells. It is a monoclonal antibody. This medicine may be used for other purposes; ask your health care provider or pharmacist if you have questions. COMMON BRAND NAME(S): IMFINZI What should I tell my care team before I take this medication? They need to know if you have any of these conditions: Allogeneic stem cell transplant (uses someone else's stem cells) Autoimmune diseases, such as Crohn disease, ulcerative  colitis, lupus History of chest radiation Nervous system problems, such as Guillain-Barre syndrome, myasthenia gravis Organ transplant An unusual or allergic reaction to durvalumab, other medications, foods, dyes, or preservatives Pregnant or trying to get pregnant Breast-feeding How should I use this medication? This  medication is infused into a vein. It is given by your care team in a hospital or clinic setting. A special MedGuide will be given to you before each treatment. Be sure to read this information carefully each time. Talk to your care team about the use of this medication in children. Special care may be needed. Overdosage: If you think you have taken too much of this medicine contact a poison control center or emergency room at once. NOTE: This medicine is only for you. Do not share this medicine with others. What if I miss a dose? Keep appointments for follow-up doses. It is important not to miss your dose. Call your care team if you are unable to keep an appointment. What may interact with this medication? Interactions have not been studied. This list may not describe all possible interactions. Give your health care provider a list of all the medicines, herbs, non-prescription drugs, or dietary supplements you use. Also tell them if you smoke, drink alcohol, or use illegal drugs. Some items may interact with your medicine. What should I watch for while using this medication? Your condition will be monitored carefully while you are receiving this medication. You may need blood work while taking this medication. This medication may cause serious skin reactions. They can happen weeks to months after starting the medication. Contact your care team right away if you notice fevers or flu-like symptoms with a rash. The rash may be red or purple and then turn into blisters or peeling of the skin. You may also notice a red rash with swelling of the face, lips, or lymph nodes in your neck or under your arms. Tell your care team right away if you have any change in your eyesight. Talk to your care team if you may be pregnant. Serious birth defects can occur if you take this medication during pregnancy and for 3 months after the last dose. You will need a negative pregnancy test before starting this medication.  Contraception is recommended while taking this medication and for 3 months after the last dose. Your care team can help you find the option that works for you. Do not breastfeed while taking this medication and for 3 months after the last dose. What side effects may I notice from receiving this medication? Side effects that you should report to your care team as soon as possible: Allergic reactions--skin rash, itching, hives, swelling of the face, lips, tongue, or throat Dry cough, shortness of breath or trouble breathing Eye pain, redness, irritation, or discharge with blurry or decreased vision Heart muscle inflammation--unusual weakness or fatigue, shortness of breath, chest pain, fast or irregular heartbeat, dizziness, swelling of the ankles, feet, or hands Hormone gland problems--headache, sensitivity to light, unusual weakness or fatigue, dizziness, fast or irregular heartbeat, increased sensitivity to cold or heat, excessive sweating, constipation, hair loss, increased thirst or amount of urine, tremors or shaking, irritability Infusion reactions--chest pain, shortness of breath or trouble breathing, feeling faint or lightheaded Kidney injury (glomerulonephritis)--decrease in the amount of urine, red or dark brown urine, foamy or bubbly urine, swelling of the ankles, hands, or feet Liver injury--right upper belly pain, loss of appetite, nausea, light-colored stool, dark yellow or brown urine, yellowing skin or eyes,  unusual weakness or fatigue Pain, tingling, or numbness in the hands or feet, muscle weakness, change in vision, confusion or trouble speaking, loss of balance or coordination, trouble walking, seizures Rash, fever, and swollen lymph nodes Redness, blistering, peeling, or loosening of the skin, including inside the mouth Sudden or severe stomach pain, bloody diarrhea, fever, nausea, vomiting Side effects that usually do not require medical attention (report these to your care team  if they continue or are bothersome): Bone, joint, or muscle pain Diarrhea Fatigue Loss of appetite Nausea Skin rash This list may not describe all possible side effects. Call your doctor for medical advice about side effects. You may report side effects to FDA at 1-800-FDA-1088. Where should I keep my medication? This medication is given in a hospital or clinic. It will not be stored at home. NOTE: This sheet is a summary. It may not cover all possible information. If you have questions about this medicine, talk to your doctor, pharmacist, or health care provider.  2024 Elsevier/Gold Standard (2022-01-05 00:00:00)

## 2023-11-15 ENCOUNTER — Other Ambulatory Visit: Payer: Self-pay | Admitting: Family Medicine

## 2023-11-15 DIAGNOSIS — I951 Orthostatic hypotension: Secondary | ICD-10-CM

## 2023-11-15 MED ORDER — MIDODRINE HCL 5 MG PO TABS: 5.0000 mg | ORAL_TABLET | Freq: Three times a day (TID) | ORAL | 1 refills | Status: DC

## 2023-11-15 NOTE — Telephone Encounter (Signed)
 Copied from CRM [id number]. Topic: Clinical - Medication Refill >> Nov 15, 2023  8:14 AM Dyann Kief wrote: Most Recent Primary Care Visit:  Provider: Lorre Munroe  Department: [id number]  Visit Type: OFFICE VISIT  Date: 01/26/2023  Medication: midodrine (PROAMATINE) 5 MG tablet  Has the patient contacted their pharmacy? Yes (Agent: If no, request that the patient contact the pharmacy for the refill. If patient does not wish to contact the pharmacy document the reason why and proceed with request.) (Agent: If yes, when and what did the pharmacy advise?)  Is this the correct pharmacy for this prescription? Yes If no, delete pharmacy and type the correct one.  This is the patient's preferred pharmacy:  [facility] - [place], [place] - [address] [place] [address] Phone: [phone] Fax: [phone]   Has the prescription been filled recently? No  Is the patient out of the medication? Yes  Has the patient been seen for an appointment in the last year OR does the patient have an upcoming appointment? Yes  Can we respond through MyChart? Yes  Agent: Please be advised that Rx refills may take up to 3 business days. We ask that you follow-up with your pharmacy.

## 2023-11-16 ENCOUNTER — Inpatient Hospital Stay: Payer: Medicare HMO | Attending: Oncology

## 2023-11-16 ENCOUNTER — Encounter: Payer: Self-pay | Admitting: Oncology

## 2023-11-16 ENCOUNTER — Inpatient Hospital Stay: Payer: Medicare HMO

## 2023-11-16 ENCOUNTER — Inpatient Hospital Stay: Payer: Medicare HMO | Admitting: Oncology

## 2023-11-16 VITALS — BP 120/92 | HR 93 | Temp 96.5°F | Resp 16 | Ht 71.0 in | Wt 162.0 lb

## 2023-11-16 DIAGNOSIS — C3411 Malignant neoplasm of upper lobe, right bronchus or lung: Secondary | ICD-10-CM | POA: Diagnosis not present

## 2023-11-16 DIAGNOSIS — Z8673 Personal history of transient ischemic attack (TIA), and cerebral infarction without residual deficits: Secondary | ICD-10-CM | POA: Diagnosis not present

## 2023-11-16 DIAGNOSIS — Z79899 Other long term (current) drug therapy: Secondary | ICD-10-CM | POA: Insufficient documentation

## 2023-11-16 DIAGNOSIS — Z87891 Personal history of nicotine dependence: Secondary | ICD-10-CM | POA: Insufficient documentation

## 2023-11-16 DIAGNOSIS — Z791 Long term (current) use of non-steroidal anti-inflammatories (NSAID): Secondary | ICD-10-CM | POA: Diagnosis not present

## 2023-11-16 DIAGNOSIS — J449 Chronic obstructive pulmonary disease, unspecified: Secondary | ICD-10-CM | POA: Insufficient documentation

## 2023-11-16 DIAGNOSIS — Z5112 Encounter for antineoplastic immunotherapy: Secondary | ICD-10-CM | POA: Insufficient documentation

## 2023-11-16 DIAGNOSIS — Z800 Family history of malignant neoplasm of digestive organs: Secondary | ICD-10-CM | POA: Insufficient documentation

## 2023-11-16 DIAGNOSIS — Z8616 Personal history of COVID-19: Secondary | ICD-10-CM | POA: Insufficient documentation

## 2023-11-16 LAB — CMP (CANCER CENTER ONLY)
ALT: 18 U/L (ref 0–44)
AST: 28 U/L (ref 15–41)
Albumin: 3.5 g/dL (ref 3.5–5.0)
Alkaline Phosphatase: 62 U/L (ref 38–126)
Anion gap: 5 (ref 5–15)
BUN: 18 mg/dL (ref 8–23)
CO2: 24 mmol/L (ref 22–32)
Calcium: 9.5 mg/dL (ref 8.9–10.3)
Chloride: 110 mmol/L (ref 98–111)
Creatinine: 0.95 mg/dL (ref 0.61–1.24)
GFR, Estimated: >60 mL/min (ref >60)
Glucose, Bld: 140 mg/dL — ABNORMAL HIGH (ref 70–99)
Potassium: 3.5 mmol/L (ref 3.5–5.1)
Sodium: 139 mmol/L (ref 135–145)
Total Bilirubin: 0.8 mg/dL (ref 0.0–1.2)
Total Protein: 6.5 g/dL (ref 6.5–8.1)

## 2023-11-16 LAB — CBC WITH DIFFERENTIAL (CANCER CENTER ONLY)
Abs Immature Granulocytes: 0.03 10*3/uL (ref 0.00–0.07)
Basophils Absolute: 0.1 10*3/uL (ref 0.0–0.1)
Basophils Relative: 1 %
Eosinophils Absolute: 0.4 10*3/uL (ref 0.0–0.5)
Eosinophils Relative: 6 %
HCT: 39.6 % (ref 39.0–52.0)
Hemoglobin: 13.2 g/dL (ref 13.0–17.0)
Immature Granulocytes: 1 %
Lymphocytes Relative: 16 %
Lymphs Abs: 1 10*3/uL (ref 0.7–4.0)
MCH: 31.1 pg (ref 26.0–34.0)
MCHC: 33.3 g/dL (ref 30.0–36.0)
MCV: 93.4 fL (ref 80.0–100.0)
Monocytes Absolute: 0.6 10*3/uL (ref 0.1–1.0)
Monocytes Relative: 9 %
Neutro Abs: 4.1 10*3/uL (ref 1.7–7.7)
Neutrophils Relative %: 67 %
Platelet Count: 246 10*3/uL (ref 150–400)
RBC: 4.24 MIL/uL (ref 4.22–5.81)
RDW: 13.2 % (ref 11.5–15.5)
WBC Count: 6.1 10*3/uL (ref 4.0–10.5)
nRBC: 0 % (ref 0.0–0.2)

## 2023-11-16 LAB — TSH: TSH: 0.017 u[IU]/mL — ABNORMAL LOW (ref 0.350–4.500)

## 2023-11-16 MED ORDER — SODIUM CHLORIDE 0.9 % IV SOLN
Freq: Once | INTRAVENOUS | Status: AC
Start: 2023-11-16 — End: 2023-11-16
  Filled 2023-11-16: qty 250

## 2023-11-16 MED ORDER — SODIUM CHLORIDE 0.9 % IV SOLN
10.0000 mg/kg | Freq: Once | INTRAVENOUS | Status: AC
  Administered 2023-11-16: 740 mg via INTRAVENOUS
  Filled 2023-11-16: qty 4.8

## 2023-11-16 MED ORDER — HEPARIN SOD (PORK) LOCK FLUSH 100 UNIT/ML IV SOLN
500.0000 [IU] | Freq: Once | INTRAVENOUS | Status: AC | PRN
Start: 2023-11-16 — End: 2023-11-16
  Administered 2023-11-16: 500 [IU]
  Filled 2023-11-16: qty 5

## 2023-11-16 MED ORDER — SODIUM CHLORIDE 0.9% FLUSH
10.0000 mL | INTRAVENOUS | Status: DC | PRN
  Filled 2023-11-16: qty 10

## 2023-11-16 NOTE — Progress Notes (Signed)
 [facility]  [phone] [phone]) [phone]  ID: Ethan Fitzgerald OB: 23-Apr-1956  MR#: [id number]  XBM#:[id number]  Patient Care Team: Smitty Cords, DO as PCP - General (Family Medicine) Micki Riley, MD as Referring Physician (Neurology) Glory Buff, RN as Oncology Nurse Navigator Orlie Dakin, Tollie Pizza, MD as Consulting Physician (Oncology)  CHIEF COMPLAINT: Stage IIIa adenocarcinoma of the right upper lobe lung.  INTERVAL HISTORY: Patient returns to clinic today for further evaluation and consideration of cycle 12 of maintenance durvalumab.  He continues to tolerate his treatments well without significant side effects.  He currently feels well and is asymptomatic.  He has no new neurologic complaints.  He does not complain of pain today.   He denies any recent fevers or illnesses. He denies any chest pain, shortness of breath, cough, or hemoptysis.  He has no nausea, vomiting, constipation, or diarrhea.  He has no urinary complaints.  Patient offers no specific complaints today.  REVIEW OF SYSTEMS:   Review of Systems  Constitutional: Negative.  Negative for fever, malaise/fatigue and weight loss.  Respiratory: Negative.  Negative for cough, hemoptysis and shortness of breath.   Cardiovascular: Negative.  Negative for chest pain and leg swelling.  Gastrointestinal: Negative.  Negative for abdominal pain.  Genitourinary: Negative.  Negative for dysuria.  Musculoskeletal: Negative.  Negative for back pain.  Skin: Negative.  Negative for rash.  Neurological: Negative.  Negative for dizziness, focal weakness, weakness and headaches.  Psychiatric/Behavioral: Negative.  The patient is not nervous/anxious.   All other systems reviewed and are negative.   As per HPI. Otherwise, a complete review of systems is negative.  PAST MEDICAL HISTORY: Past Medical History:  Diagnosis Date   COPD (chronic obstructive pulmonary disease) (HCC)    mild    COVID-19 09/2019   Depression    Erectile dysfunction    Headache    daily, stress headaches   Knee pain, left    Seasonal allergies    Stroke (HCC)    Tobacco dependence     PAST SURGICAL HISTORY: Past Surgical History:  Procedure Laterality Date   BACK SURGERY     metal plate in back   COLONOSCOPY     COLONOSCOPY WITH PROPOFOL N/A 04/26/2019   Procedure: COLONOSCOPY WITH PROPOFOL;  Surgeon: Wyline Mood, MD;  Location: [facility] ENDOSCOPY;  Service: Gastroenterology;  Laterality: N/A;   FINE NEEDLE ASPIRATION  03/03/2023   Procedure: FINE NEEDLE ASPIRATION;  Surgeon: Raechel Chute, MD;  Location: [facility] ENDOSCOPY;  Service: Pulmonary;;   HERNIA REPAIR Right    IR ANGIO INTRA EXTRACRAN SEL COM CAROTID INNOMINATE UNI L MOD SED  03/27/2020   IR ANGIO VERTEBRAL SEL SUBCLAVIAN INNOMINATE UNI L MOD SED  03/27/2020   IR ANGIO VERTEBRAL SEL VERTEBRAL UNI R MOD SED  03/27/2020   IR CT HEAD LTD  03/27/2020   IR IMAGING GUIDED PORT INSERTION  03/29/2023   IR PERCUTANEOUS ART THROMBECTOMY/INFUSION INTRACRANIAL INC DIAG ANGIO  03/27/2020   KNEE ARTHROSCOPY Left 03/28/2015   Procedure: Arthroscopic partial medial meniscectomy plus chondral debridement;  Surgeon: Erin Sons, MD;  Location: [facility];  Service: Orthopedics;  Laterality: Left;   RADIOLOGY WITH ANESTHESIA N/A 03/27/2020   Procedure: IR WITH ANESTHESIA;  Surgeon: Julieanne Cotton, MD;  Location: [facility] OR;  Service: Radiology;  Laterality: N/A;   VIDEO BRONCHOSCOPY WITH ENDOBRONCHIAL ULTRASOUND  03/03/2023   Procedure: VIDEO BRONCHOSCOPY WITH ENDOBRONCHIAL ULTRASOUND;  Surgeon: Raechel Chute, MD;  Location: [facility] ENDOSCOPY;  Service: Pulmonary;;  FAMILY HISTORY: Family History  Problem Relation Age of Onset   Diabetes Mother    Heart attack Mother 20   Cancer Father        liver    COPD Brother    HIV/AIDS Brother    Prostate cancer Neg Hx    Colon cancer Neg Hx     ADVANCED DIRECTIVES (Y/N):  N  HEALTH  MAINTENANCE: Social History   Tobacco Use   Smoking status: Former    Current packs/day: 0.00    Average packs/day: 1.5 packs/day for 49.0 years (73.5 ttl pk-yrs)    Types: Cigarettes    Start date: 2    Quit date: 09/14/2017    Years since quitting: 6.1   Smokeless tobacco: Former  Building services engineer status: Never Used  Substance Use Topics   Alcohol use: Not Currently    Comment: 6 pack on weekend   Drug use: No     Colonoscopy:  PAP:  Bone density:  Lipid panel:  No Known Allergies  Current Outpatient Medications  Medication Sig Dispense Refill   albuterol (VENTOLIN HFA) 108 (90 Base) MCG/ACT inhaler Inhale 2 puffs into the lungs every 4 (four) hours as needed for wheezing or shortness of breath. 1 each 2   aspirin EC 325 MG EC tablet Take 1 tablet (325 mg total) by mouth daily. 90 tablet 0   b complex vitamins capsule Take 1 capsule by mouth daily.     Cholecalciferol (VITAMIN D) 50 MCG (2000 UT) tablet Take 2,000 Units by mouth daily.     fluticasone (FLONASE) 50 MCG/ACT nasal spray Place 2 sprays into both nostrils daily. (Patient taking differently: Place 2 sprays into both nostrils daily as needed for allergies.) 48 g 3   melatonin 5 MG TABS Take 5 mg by mouth at bedtime as needed (sleep).     midodrine (PROAMATINE) 5 MG tablet Take 1 tablet (5 mg total) by mouth 3 (three) times daily with meals. 270 tablet 1   Multiple Vitamin (MULTIVITAMIN WITH MINERALS) TABS tablet Take 1 tablet by mouth daily.     Multiple Vitamins-Minerals (IMMUNE SUPPORT VITAMIN C PO) Take 1 tablet by mouth daily.     naproxen (NAPROSYN) 500 MG tablet Take 1 tablet (500 mg total) by mouth 2 (two) times daily with a meal. 10 tablet 0   Oxycodone HCl 10 MG TABS Take 1 tablet (10 mg total) by mouth every 6 (six) hours as needed. Take with stool softeners to avoid constipation 120 tablet 0   polyethylene glycol (MIRALAX) 17 g packet Take 17 g by mouth 2 (two) times daily as needed for mild  constipation or moderate constipation (Hold if diarrhea.). 100 each 2   rosuvastatin (CRESTOR) 40 MG tablet Take 1 tablet (40 mg total) by mouth every other day. 45 tablet 3   senna (SENOKOT) 8.6 MG TABS tablet Take 1-2 tablets (8.6-17.2 mg total) by mouth 2 (two) times daily as needed for mild constipation or moderate constipation (Hold if diarrhea). 120 tablet 0   sildenafil (REVATIO) 20 MG tablet TAKE 1 TO 5 TABLETS BY MOUTH ABOUT 30 MINUTES PRIOR TO SEX.START WITH 1 THEN INCREASE 30 tablet 5   STIOLTO RESPIMAT 2.5-2.5 MCG/ACT AERS INHALE 2 PUFFS EVERY DAY 12 g 3   sucralfate (CARAFATE) 1 g tablet Take 1 tablet (1 g total) by mouth 3 (three) times daily before meals. 90 tablet 1   No current facility-administered medications for this visit.   Facility-Administered Medications  Ordered in Other Visits  Medication Dose Route Frequency Provider Last Rate Last Admin   sodium chloride flush (NS) 0.9 % injection 10 mL  10 mL Intracatheter PRN Jeralyn Ruths, MD        OBJECTIVE: Vitals:   11/16/23 0844  BP: (!) 120/92  Pulse: 93  Resp: 16  Temp: (!) 96.5 F (35.8 C)  SpO2: 100%       Body mass index is 22.59 kg/m.    ECOG FS:0 - Asymptomatic  General: Well-developed, well-nourished, no acute distress. Eyes: Pink conjunctiva, anicteric sclera. HEENT: Normocephalic, moist mucous membranes. Lungs: No audible wheezing or coughing. Heart: Regular rate and rhythm. Abdomen: Soft, nontender, no obvious distention. Musculoskeletal: No edema, cyanosis, or clubbing. Neuro: Alert, answering all questions appropriately. Cranial nerves grossly intact. Skin: No rashes or petechiae noted. Psych: Normal affect.  LAB RESULTS:  Lab Results  Component Value Date   NA 139 11/16/2023   K 3.5 11/16/2023   CL 110 11/16/2023   CO2 24 11/16/2023   GLUCOSE 140 (H) 11/16/2023   BUN 18 11/16/2023   CREATININE 0.95 11/16/2023   CALCIUM 9.5 11/16/2023   PROT 6.5 11/16/2023   ALBUMIN 3.5  11/16/2023   AST 28 11/16/2023   ALT 18 11/16/2023   ALKPHOS 62 11/16/2023   BILITOT 0.8 11/16/2023   GFRNONAA >60 11/16/2023   GFRAA 85 12/22/2020    Lab Results  Component Value Date   WBC 6.1 11/16/2023   NEUTROABS 4.1 11/16/2023   HGB 13.2 11/16/2023   HCT 39.6 11/16/2023   MCV 93.4 11/16/2023   PLT 246 11/16/2023     STUDIES: No results found.   ASSESSMENT: Stage IIIa adenocarcinoma of the right upper lobe lung.  PLAN:    Stage IIIa adenocarcinoma of the right upper lobe lung: Pathology and imaging reviewed independently confirming diagnosis and stage of disease.  PET scan results from February 14, 2023 reviewed independently with no obvious evidence of metastatic disease.  MRI of the brain from February 11, 2023 also negative for metastatic disease.  Surgical resection was not an option, therefore patient proceeded with concurrent XRT along with weekly carboplatin and Taxol which was completed on May 27, 2023.  Patient initiated his year-long maintenance durvalumab on June 08, 2023.  Repeat PET scan on August 17, 2023 reviewed independently with significant improvement of disease burden.  Patient had a CT scan ordered by radiation oncology completed on October 14, 2023 with no evidence of disease.  Proceed with cycle 12 of maintenance durvalumab today.  Return to clinic in 2 weeks for further treatment only and then in 4 weeks for further evaluation and consideration of cycle 14.   Chest pain: Patient does not complain of this today.  Continue oxycodone as needed. Iron deficiency anemia: Resolved.  He last received IV Venofer on May 25, 2023. Dizziness: Patient does not complain of this today.  A referral was previously sent to ENT. History of CVA: Patient has self discontinued aspirin 325 daily secondary to bruising.  Previously instructed patient to discuss with his cardiologist and consider initiating 81 mg aspirin daily.  I spent a total of 30 minutes reviewing  chart data, face-to-face evaluation with the patient, counseling and coordination of care as detailed above.    Patient expressed understanding and was in agreement with this plan. He also understands that He can call clinic at any time with any questions, concerns, or complaints.    Cancer Staging  Adenocarcinoma of upper lobe of right  lung [facility]) Staging form: Lung, AJCC 8th Edition - Clinical stage from 03/15/2023: Stage IIIA (cT4, cN0, cM0) - Signed by Jeralyn Ruths, MD on 03/15/2023 Stage prefix: Initial diagnosis   Jeralyn Ruths, MD   11/16/2023 3:57 PM

## 2023-11-16 NOTE — Patient Instructions (Signed)
 [facility]  Discharge Instructions: Thank you for choosing [facility] to provide your oncology and hematology care.  If you have a lab appointment with the Cancer Center, please go directly to the Cancer Center and check in at the registration area.  Wear comfortable clothing and clothing appropriate for easy access to any Portacath or PICC line.   We strive to give you quality time with your provider. You may need to reschedule your appointment if you arrive late (15 or more minutes).  Arriving late affects you and other patients whose appointments are after yours.  Also, if you miss three or more appointments without notifying the office, you may be dismissed from the clinic at the provider's discretion.      For prescription refill requests, have your pharmacy contact our office and allow 72 hours for refills to be completed.    Today you received the following chemotherapy and/or immunotherapy agents imfinzi      To help prevent nausea and vomiting after your treatment, we encourage you to take your nausea medication as directed.  BELOW ARE SYMPTOMS THAT SHOULD BE REPORTED IMMEDIATELY: *FEVER GREATER THAN 100.4 F (38 C) OR HIGHER *CHILLS OR SWEATING *NAUSEA AND VOMITING THAT IS NOT CONTROLLED WITH YOUR NAUSEA MEDICATION *UNUSUAL SHORTNESS OF BREATH *UNUSUAL BRUISING OR BLEEDING *URINARY PROBLEMS (pain or burning when urinating, or frequent urination) *BOWEL PROBLEMS (unusual diarrhea, constipation, pain near the anus) TENDERNESS IN MOUTH AND THROAT WITH OR WITHOUT PRESENCE OF ULCERS (sore throat, sores in mouth, or a toothache) UNUSUAL RASH, SWELLING OR PAIN  UNUSUAL VAGINAL DISCHARGE OR ITCHING   Items with * indicate a potential emergency and should be followed up as soon as possible or go to the Emergency Department if any problems should occur.  Please show the CHEMOTHERAPY ALERT CARD or IMMUNOTHERAPY  ALERT CARD at check-in to the Emergency Department and triage nurse.  Should you have questions after your visit or need to cancel or reschedule your appointment, please contact [facility] - A DEPT OF [facility]  [phone] and follow the prompts.  Office hours are 8:00 a.m. to 4:30 p.m. Monday - Friday. Please note that voicemails left after 4:00 p.m. may not be returned until the following business day.  We are closed weekends and major holidays. You have access to a nurse at all times for urgent questions. Please call the main number to the clinic [phone] and follow the prompts.  For any non-urgent questions, you may also contact your provider using MyChart. We now offer e-Visits for anyone 4 and older to request care online for non-urgent symptoms. For details visit [email or web address].   Also download the MyChart app! Go to the app store, search "MyChart", open the app, select [place], and log in with your MyChart username and password.

## 2023-11-17 LAB — T4: T4, Total: 10.6 ug/dL (ref 4.5–12.0)

## 2023-11-30 ENCOUNTER — Inpatient Hospital Stay: Payer: Medicare HMO

## 2023-11-30 VITALS — BP 119/86 | HR 88 | Temp 96.5°F | Resp 17 | Ht 71.0 in | Wt 160.6 lb

## 2023-11-30 DIAGNOSIS — C3411 Malignant neoplasm of upper lobe, right bronchus or lung: Secondary | ICD-10-CM

## 2023-11-30 DIAGNOSIS — Z5112 Encounter for antineoplastic immunotherapy: Secondary | ICD-10-CM | POA: Diagnosis not present

## 2023-11-30 MED ORDER — SODIUM CHLORIDE 0.9% FLUSH
10.0000 mL | INTRAVENOUS | Status: DC | PRN
  Administered 2023-11-30: 10 mL
  Filled 2023-11-30: qty 10

## 2023-11-30 MED ORDER — HEPARIN SOD (PORK) LOCK FLUSH 100 UNIT/ML IV SOLN
500.0000 [IU] | Freq: Once | INTRAVENOUS | Status: AC | PRN
  Administered 2023-11-30: 500 [IU]
  Filled 2023-11-30: qty 5

## 2023-11-30 MED ORDER — SODIUM CHLORIDE 0.9 % IV SOLN
Freq: Once | INTRAVENOUS | Status: AC
  Filled 2023-11-30: qty 250

## 2023-11-30 MED ORDER — SODIUM CHLORIDE 0.9 % IV SOLN
10.0000 mg/kg | Freq: Once | INTRAVENOUS | Status: AC
Start: 2023-11-30 — End: 2023-11-30
  Administered 2023-11-30: 740 mg via INTRAVENOUS
  Filled 2023-11-30: qty 4.8

## 2023-11-30 NOTE — Patient Instructions (Signed)
 [facility]  Discharge Instructions: Thank you for choosing [facility] to provide your oncology and hematology care.  If you have a lab appointment with the Cancer Center, please go directly to the Cancer Center and check in at the registration area.  Wear comfortable clothing and clothing appropriate for easy access to any Portacath or PICC line.   We strive to give you quality time with your provider. You may need to reschedule your appointment if you arrive late (15 or more minutes).  Arriving late affects you and other patients whose appointments are after yours.  Also, if you miss three or more appointments without notifying the office, you may be dismissed from the clinic at the provider's discretion.      For prescription refill requests, have your pharmacy contact our office and allow 72 hours for refills to be completed.    Today you received the following chemotherapy and/or immunotherapy agents Imfinzi      To help prevent nausea and vomiting after your treatment, we encourage you to take your nausea medication as directed.  BELOW ARE SYMPTOMS THAT SHOULD BE REPORTED IMMEDIATELY: *FEVER GREATER THAN 100.4 F (38 C) OR HIGHER *CHILLS OR SWEATING *NAUSEA AND VOMITING THAT IS NOT CONTROLLED WITH YOUR NAUSEA MEDICATION *UNUSUAL SHORTNESS OF BREATH *UNUSUAL BRUISING OR BLEEDING *URINARY PROBLEMS (pain or burning when urinating, or frequent urination) *BOWEL PROBLEMS (unusual diarrhea, constipation, pain near the anus) TENDERNESS IN MOUTH AND THROAT WITH OR WITHOUT PRESENCE OF ULCERS (sore throat, sores in mouth, or a toothache) UNUSUAL RASH, SWELLING OR PAIN  UNUSUAL VAGINAL DISCHARGE OR ITCHING   Items with * indicate a potential emergency and should be followed up as soon as possible or go to the Emergency Department if any problems should occur.  Please show the CHEMOTHERAPY ALERT CARD or IMMUNOTHERAPY  ALERT CARD at check-in to the Emergency Department and triage nurse.  Should you have questions after your visit or need to cancel or reschedule your appointment, please contact [facility] - A DEPT OF [facility]  [phone] and follow the prompts.  Office hours are 8:00 a.m. to 4:30 p.m. Monday - Friday. Please note that voicemails left after 4:00 p.m. may not be returned until the following business day.  We are closed weekends and major holidays. You have access to a nurse at all times for urgent questions. Please call the main number to the clinic [phone] and follow the prompts.  For any non-urgent questions, you may also contact your provider using MyChart. We now offer e-Visits for anyone 1 and older to request care online for non-urgent symptoms. For details visit [email or web address].   Also download the MyChart app! Go to the app store, search "MyChart", open the app, select [place], and log in with your MyChart username and password.

## 2023-12-14 ENCOUNTER — Ambulatory Visit

## 2023-12-14 ENCOUNTER — Ambulatory Visit: Admitting: Oncology

## 2023-12-14 ENCOUNTER — Other Ambulatory Visit

## 2023-12-15 ENCOUNTER — Encounter: Payer: Self-pay | Admitting: Oncology

## 2023-12-15 ENCOUNTER — Inpatient Hospital Stay: Admitting: Oncology

## 2023-12-15 ENCOUNTER — Inpatient Hospital Stay: Attending: Oncology

## 2023-12-15 ENCOUNTER — Inpatient Hospital Stay

## 2023-12-15 ENCOUNTER — Other Ambulatory Visit

## 2023-12-15 ENCOUNTER — Ambulatory Visit: Admitting: Oncology

## 2023-12-15 ENCOUNTER — Ambulatory Visit

## 2023-12-15 VITALS — BP 129/76 | HR 74

## 2023-12-15 VITALS — BP 125/96 | HR 80 | Temp 97.4°F | Resp 16 | Wt 158.0 lb

## 2023-12-15 DIAGNOSIS — Z7982 Long term (current) use of aspirin: Secondary | ICD-10-CM | POA: Insufficient documentation

## 2023-12-15 DIAGNOSIS — Z8673 Personal history of transient ischemic attack (TIA), and cerebral infarction without residual deficits: Secondary | ICD-10-CM | POA: Diagnosis not present

## 2023-12-15 DIAGNOSIS — Z79899 Other long term (current) drug therapy: Secondary | ICD-10-CM | POA: Insufficient documentation

## 2023-12-15 DIAGNOSIS — C3411 Malignant neoplasm of upper lobe, right bronchus or lung: Secondary | ICD-10-CM

## 2023-12-15 DIAGNOSIS — Z791 Long term (current) use of non-steroidal anti-inflammatories (NSAID): Secondary | ICD-10-CM | POA: Insufficient documentation

## 2023-12-15 DIAGNOSIS — J449 Chronic obstructive pulmonary disease, unspecified: Secondary | ICD-10-CM | POA: Diagnosis not present

## 2023-12-15 DIAGNOSIS — R079 Chest pain, unspecified: Secondary | ICD-10-CM | POA: Insufficient documentation

## 2023-12-15 DIAGNOSIS — Z87891 Personal history of nicotine dependence: Secondary | ICD-10-CM | POA: Diagnosis not present

## 2023-12-15 DIAGNOSIS — Z8616 Personal history of COVID-19: Secondary | ICD-10-CM | POA: Diagnosis not present

## 2023-12-15 DIAGNOSIS — R42 Dizziness and giddiness: Secondary | ICD-10-CM | POA: Diagnosis not present

## 2023-12-15 DIAGNOSIS — Z5112 Encounter for antineoplastic immunotherapy: Secondary | ICD-10-CM | POA: Diagnosis present

## 2023-12-15 LAB — CBC WITH DIFFERENTIAL (CANCER CENTER ONLY)
Abs Immature Granulocytes: 0.04 10*3/uL (ref 0.00–0.07)
Basophils Absolute: 0.1 10*3/uL (ref 0.0–0.1)
Basophils Relative: 1 %
Eosinophils Absolute: 0.5 10*3/uL (ref 0.0–0.5)
Eosinophils Relative: 8 %
HCT: 39.6 % (ref 39.0–52.0)
Hemoglobin: 13.4 g/dL (ref 13.0–17.0)
Immature Granulocytes: 1 %
Lymphocytes Relative: 20 %
Lymphs Abs: 1.3 10*3/uL (ref 0.7–4.0)
MCH: 31.2 pg (ref 26.0–34.0)
MCHC: 33.8 g/dL (ref 30.0–36.0)
MCV: 92.3 fL (ref 80.0–100.0)
Monocytes Absolute: 0.6 10*3/uL (ref 0.1–1.0)
Monocytes Relative: 9 %
Neutro Abs: 4.2 10*3/uL (ref 1.7–7.7)
Neutrophils Relative %: 61 %
Platelet Count: 265 10*3/uL (ref 150–400)
RBC: 4.29 MIL/uL (ref 4.22–5.81)
RDW: 13.2 % (ref 11.5–15.5)
WBC Count: 6.8 10*3/uL (ref 4.0–10.5)
nRBC: 0 % (ref 0.0–0.2)

## 2023-12-15 LAB — CMP (CANCER CENTER ONLY)
ALT: 21 U/L (ref 0–44)
AST: 28 U/L (ref 15–41)
Albumin: 3.6 g/dL (ref 3.5–5.0)
Alkaline Phosphatase: 72 U/L (ref 38–126)
Anion gap: 4 — ABNORMAL LOW (ref 5–15)
BUN: 28 mg/dL — ABNORMAL HIGH (ref 8–23)
CO2: 20 mmol/L — ABNORMAL LOW (ref 22–32)
Calcium: 9.6 mg/dL (ref 8.9–10.3)
Chloride: 110 mmol/L (ref 98–111)
Creatinine: 0.92 mg/dL (ref 0.61–1.24)
GFR, Estimated: >60 mL/min (ref >60)
Glucose, Bld: 120 mg/dL — ABNORMAL HIGH (ref 70–99)
Potassium: 3.5 mmol/L (ref 3.5–5.1)
Sodium: 134 mmol/L — ABNORMAL LOW (ref 135–145)
Total Bilirubin: 1 mg/dL (ref 0.0–1.2)
Total Protein: 6.5 g/dL (ref 6.5–8.1)

## 2023-12-15 LAB — TSH: TSH: <0.01 u[IU]/mL — ABNORMAL LOW (ref 0.350–4.500)

## 2023-12-15 MED ORDER — SODIUM CHLORIDE 0.9 % IV SOLN
10.0000 mg/kg | Freq: Once | INTRAVENOUS | Status: AC
  Administered 2023-12-15: 740 mg via INTRAVENOUS
  Filled 2023-12-15: qty 4.8

## 2023-12-15 MED ORDER — SODIUM CHLORIDE 0.9 % IV SOLN
Freq: Once | INTRAVENOUS | Status: AC
Start: 2023-12-15 — End: 2023-12-15
  Filled 2023-12-15: qty 250

## 2023-12-15 MED ORDER — SODIUM CHLORIDE 0.9% FLUSH
10.0000 mL | INTRAVENOUS | Status: DC | PRN
  Administered 2023-12-15: 10 mL
  Filled 2023-12-15: qty 10

## 2023-12-15 MED ORDER — HEPARIN SOD (PORK) LOCK FLUSH 100 UNIT/ML IV SOLN
500.0000 [IU] | Freq: Once | INTRAVENOUS | Status: AC | PRN
  Administered 2023-12-15: 500 [IU]
  Filled 2023-12-15: qty 5

## 2023-12-15 NOTE — Progress Notes (Signed)
 [facility]  Telephone:(336) [phone] Fax:(336) [phone]  ID: Ethan Fitzgerald OB: 19-Oct-1955  MR#: [id number]  VQQ#:[id number]  Patient Care Team: Smitty Cords, DO as PCP - General (Family Medicine) Micki Riley, MD as Referring Physician (Neurology) Glory Buff, RN as Oncology Nurse Navigator Orlie Dakin, Tollie Pizza, MD as Consulting Physician (Oncology)  CHIEF COMPLAINT: Stage IIIa adenocarcinoma of the right upper lobe lung.  INTERVAL HISTORY: Patient returns to clinic today for further evaluation and consideration of cycle 14 of maintenance durvalumab.  He currently feels well is asymptomatic.  He continues to tolerate his treatments without significant side effects.  He has no new neurologic complaints.  He does not complain of pain today.   He denies any recent fevers or illnesses. He denies any chest pain, shortness of breath, cough, or hemoptysis.  He has no nausea, vomiting, constipation, or diarrhea.  He has no urinary complaints.  Patient offers no specific complaints today.  REVIEW OF SYSTEMS:   Review of Systems  Constitutional: Negative.  Negative for fever, malaise/fatigue and weight loss.  Respiratory: Negative.  Negative for cough, hemoptysis and shortness of breath.   Cardiovascular: Negative.  Negative for chest pain and leg swelling.  Gastrointestinal: Negative.  Negative for abdominal pain.  Genitourinary: Negative.  Negative for dysuria.  Musculoskeletal: Negative.  Negative for back pain.  Skin: Negative.  Negative for rash.  Neurological: Negative.  Negative for dizziness, focal weakness, weakness and headaches.  Psychiatric/Behavioral: Negative.  The patient is not nervous/anxious.   All other systems reviewed and are negative.   As per HPI. Otherwise, a complete review of systems is negative.  PAST MEDICAL HISTORY: Past Medical History:  Diagnosis Date   COPD (chronic obstructive pulmonary disease) (HCC)    mild   COVID-19  09/2019   Depression    Erectile dysfunction    Headache    daily, stress headaches   Knee pain, left    Seasonal allergies    Stroke (HCC)    Tobacco dependence     PAST SURGICAL HISTORY: Past Surgical History:  Procedure Laterality Date   BACK SURGERY     metal plate in back   COLONOSCOPY     COLONOSCOPY WITH PROPOFOL N/A 04/26/2019   Procedure: COLONOSCOPY WITH PROPOFOL;  Surgeon: Wyline Mood, MD;  Location: [facility] ENDOSCOPY;  Service: Gastroenterology;  Laterality: N/A;   FINE NEEDLE ASPIRATION  03/03/2023   Procedure: FINE NEEDLE ASPIRATION;  Surgeon: Raechel Chute, MD;  Location: [facility] ENDOSCOPY;  Service: Pulmonary;;   HERNIA REPAIR Right    IR ANGIO INTRA EXTRACRAN SEL COM CAROTID INNOMINATE UNI L MOD SED  03/27/2020   IR ANGIO VERTEBRAL SEL SUBCLAVIAN INNOMINATE UNI L MOD SED  03/27/2020   IR ANGIO VERTEBRAL SEL VERTEBRAL UNI R MOD SED  03/27/2020   IR CT HEAD LTD  03/27/2020   IR IMAGING GUIDED PORT INSERTION  03/29/2023   IR PERCUTANEOUS ART THROMBECTOMY/INFUSION INTRACRANIAL INC DIAG ANGIO  03/27/2020   KNEE ARTHROSCOPY Left 03/28/2015   Procedure: Arthroscopic partial medial meniscectomy plus chondral debridement;  Surgeon: Erin Sons, MD;  Location: [facility]GERY [facility];  Service: Orthopedics;  Laterality: Left;   RADIOLOGY WITH ANESTHESIA N/A 03/27/2020   Procedure: IR WITH ANESTHESIA;  Surgeon: Julieanne Cotton, MD;  Location: [facility] OR;  Service: Radiology;  Laterality: N/A;   VIDEO BRONCHOSCOPY WITH ENDOBRONCHIAL ULTRASOUND  03/03/2023   Procedure: VIDEO BRONCHOSCOPY WITH ENDOBRONCHIAL ULTRASOUND;  Surgeon: Raechel Chute, MD;  Location: [facility] ENDOSCOPY;  Service: Pulmonary;;  FAMILY HISTORY: Family History  Problem Relation Age of Onset   Diabetes Mother    Heart attack Mother 81   Cancer Father        liver    COPD Brother    HIV/AIDS Brother    Prostate cancer Neg Hx    Colon cancer Neg Hx     ADVANCED DIRECTIVES (Y/N):  N  HEALTH  MAINTENANCE: Social History   Tobacco Use   Smoking status: Former    Current packs/day: 0.00    Average packs/day: 1.5 packs/day for 49.0 years (73.5 ttl pk-yrs)    Types: Cigarettes    Start date: 82    Quit date: 09/14/2017    Years since quitting: 6.2   Smokeless tobacco: Former  Building services engineer status: Never Used  Substance Use Topics   Alcohol use: Not Currently    Comment: 6 pack on weekend   Drug use: No     Colonoscopy:  PAP:  Bone density:  Lipid panel:  No Known Allergies  Current Outpatient Medications  Medication Sig Dispense Refill   albuterol (VENTOLIN HFA) 108 (90 Base) MCG/ACT inhaler Inhale 2 puffs into the lungs every 4 (four) hours as needed for wheezing or shortness of breath. 1 each 2   aspirin EC 325 MG EC tablet Take 1 tablet (325 mg total) by mouth daily. 90 tablet 0   b complex vitamins capsule Take 1 capsule by mouth daily.     Cholecalciferol (VITAMIN D) 50 MCG (2000 UT) tablet Take 2,000 Units by mouth daily.     fluticasone (FLONASE) 50 MCG/ACT nasal spray Place 2 sprays into both nostrils daily. (Patient taking differently: Place 2 sprays into both nostrils daily as needed for allergies.) 48 g 3   melatonin 5 MG TABS Take 5 mg by mouth at bedtime as needed (sleep).     midodrine (PROAMATINE) 5 MG tablet Take 1 tablet (5 mg total) by mouth 3 (three) times daily with meals. 270 tablet 1   Multiple Vitamin (MULTIVITAMIN WITH MINERALS) TABS tablet Take 1 tablet by mouth daily.     Multiple Vitamins-Minerals (IMMUNE SUPPORT VITAMIN C PO) Take 1 tablet by mouth daily.     naproxen (NAPROSYN) 500 MG tablet Take 1 tablet (500 mg total) by mouth 2 (two) times daily with a meal. 10 tablet 0   Oxycodone HCl 10 MG TABS Take 1 tablet (10 mg total) by mouth every 6 (six) hours as needed. Take with stool softeners to avoid constipation 120 tablet 0   polyethylene glycol (MIRALAX) 17 g packet Take 17 g by mouth 2 (two) times daily as needed for mild  constipation or moderate constipation (Hold if diarrhea.). 100 each 2   rosuvastatin (CRESTOR) 40 MG tablet Take 1 tablet (40 mg total) by mouth every other day. 45 tablet 3   senna (SENOKOT) 8.6 MG TABS tablet Take 1-2 tablets (8.6-17.2 mg total) by mouth 2 (two) times daily as needed for mild constipation or moderate constipation (Hold if diarrhea). 120 tablet 0   sildenafil (REVATIO) 20 MG tablet TAKE 1 TO 5 TABLETS BY MOUTH ABOUT 30 MINUTES PRIOR TO SEX.START WITH 1 THEN INCREASE 30 tablet 5   STIOLTO RESPIMAT 2.5-2.5 MCG/ACT AERS INHALE 2 PUFFS EVERY DAY 12 g 3   sucralfate (CARAFATE) 1 g tablet Take 1 tablet (1 g total) by mouth 3 (three) times daily before meals. 90 tablet 1   traMADol (ULTRAM) 50 MG tablet Take 50 mg by mouth every  6 (six) hours as needed.     No current facility-administered medications for this visit.    OBJECTIVE: Vitals:   12/15/23 0834  BP: (!) 125/96  Pulse: 80  Resp: 16  Temp: (!) 97.4 F (36.3 C)  SpO2: 100%       Body mass index is 22.04 kg/m.    ECOG FS:0 - Asymptomatic  General: Well-developed, well-nourished, no acute distress. Eyes: Pink conjunctiva, anicteric sclera. HEENT: Normocephalic, moist mucous membranes. Lungs: No audible wheezing or coughing. Heart: Regular rate and rhythm. Abdomen: Soft, nontender, no obvious distention. Musculoskeletal: No edema, cyanosis, or clubbing. Neuro: Alert, answering all questions appropriately. Cranial nerves grossly intact. Skin: No rashes or petechiae noted. Psych: Normal affect.  LAB RESULTS:  Lab Results  Component Value Date   NA 134 (L) 12/15/2023   K 3.5 12/15/2023   CL 110 12/15/2023   CO2 20 (L) 12/15/2023   GLUCOSE 120 (H) 12/15/2023   BUN 28 (H) 12/15/2023   CREATININE 0.92 12/15/2023   CALCIUM 9.6 12/15/2023   PROT 6.5 12/15/2023   ALBUMIN 3.6 12/15/2023   AST 28 12/15/2023   ALT 21 12/15/2023   ALKPHOS 72 12/15/2023   BILITOT 1.0 12/15/2023   GFRNONAA >60 12/15/2023   GFRAA  85 12/22/2020    Lab Results  Component Value Date   WBC 6.8 12/15/2023   NEUTROABS 4.2 12/15/2023   HGB 13.4 12/15/2023   HCT 39.6 12/15/2023   MCV 92.3 12/15/2023   PLT 265 12/15/2023     STUDIES: No results found.   ASSESSMENT: Stage IIIa adenocarcinoma of the right upper lobe lung.  PLAN:    Stage IIIa adenocarcinoma of the right upper lobe lung: Pathology and imaging reviewed independently confirming diagnosis and stage of disease.  PET scan results from February 14, 2023 reviewed independently with no obvious evidence of metastatic disease.  MRI of the brain from February 11, 2023 also negative for metastatic disease.  Surgical resection was not an option, therefore patient proceeded with concurrent XRT along with weekly carboplatin and Taxol which was completed on May 27, 2023.  Patient initiated his year-long maintenance durvalumab on June 08, 2023.  Repeat PET scan on August 17, 2023 reviewed independently with significant improvement of disease burden.  Patient had a CT scan ordered by radiation oncology completed on October 14, 2023 with no evidence of disease.  Proceed with cycle 14 of treatment today.  Return to clinic in 2 weeks for treatment only and then in 4 weeks for further evaluation and consideration of cycle 16.  Consider repeat imaging in August 2025. Chest pain: Patient does not complain of this today.  Continue oxycodone as needed. Iron deficiency anemia: Resolved.  He last received IV Venofer on May 25, 2023. Dizziness: Patient does not complain of this today.  A referral was previously sent to ENT. History of CVA: Patient reports he is on aspirin 325 mg daily.   I spent a total of 30 minutes reviewing chart data, face-to-face evaluation with the patient, counseling and coordination of care as detailed above.   Patient expressed understanding and was in agreement with this plan. He also understands that He can call clinic at any time with any  questions, concerns, or complaints.    Cancer Staging  Adenocarcinoma of upper lobe of right lung [facility]) Staging form: Lung, AJCC 8th Edition - Clinical stage from 03/15/2023: Stage IIIA (cT4, cN0, cM0) - Signed by Jeralyn Ruths, MD on 03/15/2023 Stage prefix: Initial diagnosis  Jeralyn Ruths, MD   12/15/2023 9:09 AM

## 2023-12-16 LAB — T4: T4, Total: 10.7 ug/dL (ref 4.5–12.0)

## 2023-12-28 ENCOUNTER — Inpatient Hospital Stay

## 2023-12-28 VITALS — BP 120/75 | HR 72 | Temp 97.0°F | Resp 16 | Wt 157.1 lb

## 2023-12-28 DIAGNOSIS — Z5112 Encounter for antineoplastic immunotherapy: Secondary | ICD-10-CM | POA: Diagnosis not present

## 2023-12-28 DIAGNOSIS — C3411 Malignant neoplasm of upper lobe, right bronchus or lung: Secondary | ICD-10-CM

## 2023-12-28 MED ORDER — HEPARIN SOD (PORK) LOCK FLUSH 100 UNIT/ML IV SOLN
500.0000 [IU] | Freq: Once | INTRAVENOUS | Status: AC | PRN
  Administered 2023-12-28: 500 [IU]
  Filled 2023-12-28: qty 5

## 2023-12-28 MED ORDER — SODIUM CHLORIDE 0.9 % IV SOLN
Freq: Once | INTRAVENOUS | Status: AC
  Filled 2023-12-28: qty 250

## 2023-12-28 MED ORDER — SODIUM CHLORIDE 0.9 % IV SOLN
10.0000 mg/kg | Freq: Once | INTRAVENOUS | Status: AC
  Administered 2023-12-28: 740 mg via INTRAVENOUS
  Filled 2023-12-28: qty 4.8

## 2023-12-28 NOTE — Patient Instructions (Signed)
 [facility]  Discharge Instructions: Thank you for choosing [facility] to provide your oncology and hematology care.  If you have a lab appointment with the Cancer Center, please go directly to the Cancer Center and check in at the registration area.  Wear comfortable clothing and clothing appropriate for easy access to any Portacath or PICC line.   We strive to give you quality time with your provider. You may need to reschedule your appointment if you arrive late (15 or more minutes).  Arriving late affects you and other patients whose appointments are after yours.  Also, if you miss three or more appointments without notifying the office, you may be dismissed from the clinic at the provider's discretion.      For prescription refill requests, have your pharmacy contact our office and allow 72 hours for refills to be completed.    Today you received the following chemotherapy and/or immunotherapy agents Imfinzi      To help prevent nausea and vomiting after your treatment, we encourage you to take your nausea medication as directed.  BELOW ARE SYMPTOMS THAT SHOULD BE REPORTED IMMEDIATELY: *FEVER GREATER THAN 100.4 F (38 C) OR HIGHER *CHILLS OR SWEATING *NAUSEA AND VOMITING THAT IS NOT CONTROLLED WITH YOUR NAUSEA MEDICATION *UNUSUAL SHORTNESS OF BREATH *UNUSUAL BRUISING OR BLEEDING *URINARY PROBLEMS (pain or burning when urinating, or frequent urination) *BOWEL PROBLEMS (unusual diarrhea, constipation, pain near the anus) TENDERNESS IN MOUTH AND THROAT WITH OR WITHOUT PRESENCE OF ULCERS (sore throat, sores in mouth, or a toothache) UNUSUAL RASH, SWELLING OR PAIN  UNUSUAL VAGINAL DISCHARGE OR ITCHING   Items with * indicate a potential emergency and should be followed up as soon as possible or go to the Emergency Department if any problems should occur.  Please show the CHEMOTHERAPY ALERT CARD or IMMUNOTHERAPY  ALERT CARD at check-in to the Emergency Department and triage nurse.  Should you have questions after your visit or need to cancel or reschedule your appointment, please contact [facility] - A DEPT OF [facility]  [phone] and follow the prompts.  Office hours are 8:00 a.m. to 4:30 p.m. Monday - Friday. Please note that voicemails left after 4:00 p.m. may not be returned until the following business day.  We are closed weekends and major holidays. You have access to a nurse at all times for urgent questions. Please call the main number to the clinic [phone] and follow the prompts.  For any non-urgent questions, you may also contact your provider using MyChart. We now offer e-Visits for anyone 1 and older to request care online for non-urgent symptoms. For details visit [email or web address].   Also download the MyChart app! Go to the app store, search "MyChart", open the app, select [place], and log in with your MyChart username and password.

## 2023-12-29 ENCOUNTER — Telehealth: Payer: Self-pay | Admitting: Nurse Practitioner

## 2023-12-29 ENCOUNTER — Encounter: Payer: Self-pay | Admitting: Nurse Practitioner

## 2023-12-29 ENCOUNTER — Ambulatory Visit (INDEPENDENT_AMBULATORY_CARE_PROVIDER_SITE_OTHER): Payer: Medicare HMO | Admitting: Nurse Practitioner

## 2023-12-29 VITALS — BP 108/80 | HR 79 | Temp 97.6°F | Ht 70.25 in | Wt 156.2 lb

## 2023-12-29 DIAGNOSIS — Z0000 Encounter for general adult medical examination without abnormal findings: Secondary | ICD-10-CM | POA: Insufficient documentation

## 2023-12-29 DIAGNOSIS — Z131 Encounter for screening for diabetes mellitus: Secondary | ICD-10-CM | POA: Diagnosis not present

## 2023-12-29 DIAGNOSIS — J449 Chronic obstructive pulmonary disease, unspecified: Secondary | ICD-10-CM | POA: Diagnosis not present

## 2023-12-29 DIAGNOSIS — Z1211 Encounter for screening for malignant neoplasm of colon: Secondary | ICD-10-CM

## 2023-12-29 DIAGNOSIS — I9589 Other hypotension: Secondary | ICD-10-CM | POA: Diagnosis not present

## 2023-12-29 DIAGNOSIS — Z87891 Personal history of nicotine dependence: Secondary | ICD-10-CM

## 2023-12-29 DIAGNOSIS — C3411 Malignant neoplasm of upper lobe, right bronchus or lung: Secondary | ICD-10-CM

## 2023-12-29 DIAGNOSIS — Z125 Encounter for screening for malignant neoplasm of prostate: Secondary | ICD-10-CM

## 2023-12-29 DIAGNOSIS — Z8673 Personal history of transient ischemic attack (TIA), and cerebral infarction without residual deficits: Secondary | ICD-10-CM

## 2023-12-29 NOTE — Assessment & Plan Note (Addendum)
 Hsitory of the same and on a statin

## 2023-12-29 NOTE — Progress Notes (Signed)
 New Patient Office Visit  Subjective    [name], male    DOB: 26-Sep-1955  Age: 68 y.o. MRN: [id number]  CC:  Chief Complaint  Patient presents with   Establish Care    General check up    HPI Ethan Fitzgerald presents to establish care   Lung CA: he is being followed Dr. Adrian Alba. He is currently under treatment. He is being seen once a month  CVA: He has a left sided stroke. States that he will drop on the left side of the moutn and umbness/tingling on the left hand   ED: [facility]. For Sidenifal. He is taking 5 of the 20mg  tablets at a time   Back pain: Dr Ozella Blush   COPD: every moring with stilito. States that he had to use the albuterol  once last week with the cutting grass.   for complete physical and follow up of chronic conditions.  Immunizations: -Tetanus: Completed in 2018 -Influenza: out of season  -Shingles: refused  -Pneumonia: Refused -covid: original seires    Diet: Fair diet. He eats approx 2 meals a day with snacks and a protein drink. He will do coffee (2 cups) and will do water  Exercise: No regular exercise. Works around [employer]. He has been doing PT.  Eye exam: Has an appt scheduled. He does have readers  Dental exam: Patient has a full plate  Colonoscopy: Completed in 04/26/2019, recall in 8 months.  Patient overdue referral placed today Lung Cancer Screening: N/A patient currently undergoing treatment for lung cancer  PSA: Due   Sleep: goes to bed around 930-10 and watches tv in bed. He takes melatonin 07-1114. He gets up around 730-8. Feels rested   Outpatient Encounter Medications as of 12/29/2023  Medication Sig   albuterol  (VENTOLIN  HFA) 108 (90 Base) MCG/ACT inhaler Inhale 2 puffs into the lungs every 4 (four) hours as needed for wheezing or shortness of breath.   aspirin  EC 325 MG EC tablet Take 1 tablet (325 mg total) by mouth daily.   b complex vitamins capsule Take 1 capsule by mouth daily.   Cholecalciferol  (VITAMIN D ) 50 MCG (2000 UT) tablet Take 2,000 Units by mouth daily.   fluticasone  (FLONASE ) 50 MCG/ACT nasal spray Place 2 sprays into both nostrils daily. (Patient taking differently: Place 2 sprays into both nostrils daily as needed for allergies.)   melatonin 5 MG TABS Take 5 mg by mouth at bedtime as needed (sleep).   midodrine  (PROAMATINE ) 5 MG tablet Take 1 tablet (5 mg total) by mouth 3 (three) times daily with meals.   Multiple Vitamin (MULTIVITAMIN WITH MINERALS) TABS tablet Take 1 tablet by mouth daily.   Multiple Vitamins-Minerals (IMMUNE SUPPORT VITAMIN C PO) Take 1 tablet by mouth daily.   naproxen  (NAPROSYN ) 500 MG tablet Take 1 tablet (500 mg total) by mouth 2 (two) times daily with a meal.   Oxycodone  HCl 10 MG TABS Take 1 tablet (10 mg total) by mouth every 6 (six) hours as needed. Take with stool softeners to avoid constipation   polyethylene glycol (MIRALAX ) 17 g packet Take 17 g by mouth 2 (two) times daily as needed for mild constipation or moderate constipation (Hold if diarrhea.).   rosuvastatin  (CRESTOR ) 40 MG tablet Take 1 tablet (40 mg total) by mouth every other day.   senna (SENOKOT) 8.6 MG TABS tablet Take 1-2 tablets (8.6-17.2 mg total) by mouth 2 (two) times daily as needed for mild constipation or  moderate constipation (Hold if diarrhea).   sildenafil  (REVATIO ) 20 MG tablet TAKE 1 TO 5 TABLETS BY MOUTH ABOUT 30 MINUTES PRIOR TO SEX.START WITH 1 THEN INCREASE   STIOLTO RESPIMAT  2.5-2.5 MCG/ACT AERS INHALE 2 PUFFS EVERY DAY   sucralfate  (CARAFATE ) 1 g tablet Take 1 tablet (1 g total) by mouth 3 (three) times daily before meals.   traMADol (ULTRAM) 50 MG tablet Take 50 mg by mouth every 6 (six) hours as needed.   No facility-administered encounter medications on file as of 12/29/2023.    Past Medical History:  Diagnosis Date   COPD (chronic obstructive pulmonary disease) (HCC)    mild   COVID-19 09/2019   Depression    Erectile dysfunction    Headache    daily,  stress headaches   Knee pain, left    Seasonal allergies    Stroke [facility])    Tobacco dependence     Past Surgical History:  Procedure Laterality Date   BACK SURGERY     metal plate in back   COLONOSCOPY     COLONOSCOPY WITH PROPOFOL  N/A 04/26/2019   Procedure: COLONOSCOPY WITH PROPOFOL ;  Surgeon: Luke Salaam, MD;  Location: [facility] ENDOSCOPY;  Service: Gastroenterology;  Laterality: N/A;   FINE NEEDLE ASPIRATION  03/03/2023   Procedure: FINE NEEDLE ASPIRATION;  Surgeon: Vergia Glasgow, MD;  Location: [facility] ENDOSCOPY;  Service: Pulmonary;;   HERNIA REPAIR Right    IR ANGIO INTRA EXTRACRAN SEL COM CAROTID INNOMINATE UNI L MOD SED  03/27/2020   IR ANGIO VERTEBRAL SEL SUBCLAVIAN INNOMINATE UNI L MOD SED  03/27/2020   IR ANGIO VERTEBRAL SEL VERTEBRAL UNI R MOD SED  03/27/2020   IR CT HEAD LTD  03/27/2020   IR IMAGING GUIDED PORT INSERTION  03/29/2023   IR PERCUTANEOUS ART THROMBECTOMY/INFUSION INTRACRANIAL INC DIAG ANGIO  03/27/2020   KNEE ARTHROSCOPY Left 03/28/2015   Procedure: Arthroscopic partial medial meniscectomy plus chondral debridement;  Surgeon: Josephus Nida, MD;  Location: [facility];  Service: Orthopedics;  Laterality: Left;   RADIOLOGY WITH ANESTHESIA N/A 03/27/2020   Procedure: IR WITH ANESTHESIA;  Surgeon: Luellen Sages, MD;  Location: [facility] OR;  Service: Radiology;  Laterality: N/A;   VIDEO BRONCHOSCOPY WITH ENDOBRONCHIAL ULTRASOUND  03/03/2023   Procedure: VIDEO BRONCHOSCOPY WITH ENDOBRONCHIAL ULTRASOUND;  Surgeon: Vergia Glasgow, MD;  Location: [facility] ENDOSCOPY;  Service: Pulmonary;;    Family History  Problem Relation Age of Onset   Diabetes Mother    Heart attack Mother 75   Cancer Father        liver    COPD Brother    HIV/AIDS Brother    Prostate cancer Neg Hx    Colon cancer Neg Hx     Social History   Socioeconomic History   Marital status: Married    Spouse name: Libby Ree   Number of children: 0   Years of education: Not on file   Highest  education level: Some college, no degree  Occupational History   Not on file  Tobacco Use   Smoking status: Former    Current packs/day: 0.00    Average packs/day: 1.5 packs/day for 49.0 years (73.5 ttl pk-yrs)    Types: Cigarettes    Start date: 32    Quit date: 09/14/2017    Years since quitting: 6.2   Smokeless tobacco: Former  Building services engineer status: Never Used  Substance and Sexual Activity   Alcohol use: Not Currently    Comment: 6 pack on weekend  Drug use: No   Sexual activity: Not on file  Other Topics Concern   Not on file  Social History Narrative   Lives at home w wife   L handed   Caffeine: 1 C of coffee AM   Social Drivers of Health   Financial Resource Strain: Low Risk  (02/09/2023)   Overall Financial Resource Strain (CARDIA)    Difficulty of Paying Living Expenses: Not hard at all  Food Insecurity: No Food Insecurity (02/09/2023)   Hunger Vital Sign    Worried About Running Out of Food in the Last Year: Never true    Ran Out of Food in the Last Year: Never true  Transportation Needs: No Transportation Needs (02/09/2023)   PRAPARE - Administrator, Civil Service (Medical): No    Lack of Transportation (Non-Medical): No  Physical Activity: Insufficiently Active (11/05/2022)   Exercise Vital Sign    Days of Exercise per Week: 3 days    Minutes of Exercise per Session: 20 min  Stress: No Stress Concern Present (11/05/2022)   [employer] of Occupational Health - Occupational Stress Questionnaire    Feeling of Stress : Not at all  Social Connections: Socially Isolated (11/05/2022)   Social Connection and Isolation Panel [NHANES]    Frequency of Communication with Friends and Family: Once a week    Frequency of Social Gatherings with Friends and Family: Never    Attends Religious Services: Never    Database administrator or Organizations: No    Attends Banker Meetings: Never    Marital Status: Married  Catering manager Violence:  Not At Risk (02/09/2023)   Humiliation, Afraid, Rape, and Kick questionnaire    Fear of Current or Ex-Partner: No    Emotionally Abused: No    Physically Abused: No    Sexually Abused: No    Review of Systems  Constitutional:  Negative for chills and fever.  Respiratory:  Negative for shortness of breath.   Cardiovascular:  Negative for chest pain and leg swelling.  Gastrointestinal:  Negative for abdominal pain, blood in stool, constipation, diarrhea, nausea and vomiting.       BM every other day   Genitourinary:  Negative for dysuria and hematuria.  Neurological:  Positive for tingling. Negative for headaches.  Psychiatric/Behavioral:  Negative for hallucinations and suicidal ideas.         Objective    BP 108/80   Pulse 79   Temp 97.6 F (36.4 C) (Oral)   Ht 5' 10.25" (1.784 m)   Wt 156 lb 3.2 oz (70.9 kg)   SpO2 98%   BMI 22.25 kg/m   Physical Exam Vitals and nursing note reviewed.  Constitutional:      Appearance: Normal appearance.  HENT:     Right Ear: Tympanic membrane, ear canal and external ear normal.     Left Ear: Tympanic membrane, ear canal and external ear normal.     Mouth/Throat:     Mouth: Mucous membranes are moist.     Pharynx: Oropharynx is clear.  Eyes:     Extraocular Movements: Extraocular movements intact.     Pupils: Pupils are equal, round, and reactive to light.  Cardiovascular:     Rate and Rhythm: Normal rate and regular rhythm.     Pulses: Normal pulses.     Heart sounds: Normal heart sounds.  Pulmonary:     Effort: Pulmonary effort is normal.     Breath sounds: Normal breath sounds.  Abdominal:     General: Bowel sounds are normal. There is no distension.     Palpations: There is no mass.     Tenderness: There is no abdominal tenderness.     Hernia: No hernia is present.  Musculoskeletal:     Right lower leg: No edema.     Left lower leg: No edema.  Lymphadenopathy:     Cervical: No cervical adenopathy.  Skin:    General:  Skin is warm.  Neurological:     General: No focal deficit present.     Mental Status: He is alert.     Deep Tendon Reflexes:     Reflex Scores:      Bicep reflexes are 2+ on the right side and 2+ on the left side.      Patellar reflexes are 2+ on the right side and 2+ on the left side.    Comments: Bilateral upper and lower extremity strength 5/5  Facial draw/drop on the left side   Psychiatric:        Mood and Affect: Mood normal.        Behavior: Behavior normal.        Thought Content: Thought content normal.        Judgment: Judgment normal.         Assessment & Plan:   Problem List Items Addressed This Visit       Cardiovascular and Mediastinum   Hypotension   Patient has a history of hypotension requiring midodrine  10 mg 3 times daily.  Continue medication as prescribed blood pressure controlled here        Respiratory   Adenocarcinoma of upper lobe of right lung [facility])   Patient currently followed by oncology, Dr. Lannette Pitter.  Continue following with oncology as recommended      COPD (chronic obstructive pulmonary disease) (HCC)   History of the same.  Patient currently maintained on Stiolto and albuterol  as needed.  Patient is well-controlled continue        Other   Former smoker   Pending urine microscopy rule out microscopic hematuria      History of stroke   Hsitory of the same and on a statin       Relevant Orders   Lipid panel   Preventative health care - Primary   Discussed age-appropriate immunizations and exams.  Did review patient's personal, surgical, social, family histories.  Patient is up-to-date on all age-appropriate vaccinations he would like.  Patient refused shingles and pneumonia vaccine today.  Patient is overdue for CRC screening.  Ambulatory referral placed today.  PSA ordered for prostate cancer screening.  Patient was given information at discharge about preventative healthcare maintenance with anticipatory guidance.       Other Visit Diagnoses       Screening for diabetes mellitus       Relevant Orders   Hemoglobin A1c     Screening for prostate cancer       Relevant Orders   PSA, Medicare     Screening for colon cancer       Relevant Orders   Ambulatory referral to Gastroenterology       Return in about 6 months (around 06/29/2024) for BP recheck/COPD.   Margarie Shay, NP

## 2023-12-29 NOTE — Assessment & Plan Note (Signed)
 Pending urine microscopy rule out microscopic hematuria

## 2023-12-29 NOTE — Assessment & Plan Note (Signed)
 Discussed age-appropriate immunizations and exams.  Did review patient's personal, surgical, social, family histories.  Patient is up-to-date on all age-appropriate vaccinations he would like.  Patient refused shingles and pneumonia vaccine today.  Patient is overdue for CRC screening.  Ambulatory referral placed today.  PSA ordered for prostate cancer screening.  Patient was given information at discharge about preventative healthcare maintenance with anticipatory guidance.

## 2023-12-29 NOTE — Assessment & Plan Note (Signed)
 Patient currently followed by oncology, Dr. Lannette Pitter.  Continue following with oncology as recommended

## 2023-12-29 NOTE — Assessment & Plan Note (Signed)
 Patient has a history of hypotension requiring midodrine  10 mg 3 times daily.  Continue medication as prescribed blood pressure controlled here

## 2023-12-29 NOTE — Patient Instructions (Signed)
 Nice to see you today I will be in touch with the labs once I have them  Follow up with em in 6 months, sooner if you need me

## 2023-12-29 NOTE — Telephone Encounter (Signed)
 Dr. Adrian Alba,  I saw Ethan Fitzgerald in office today.  He mentions that he gets blood drawn through his port when he sees you.  I have added some labs to the system was curious if you guys would draw those labs when drawing blood off of his port.  Thanks, [employer]

## 2023-12-29 NOTE — Assessment & Plan Note (Signed)
 History of the same.  Patient currently maintained on Stiolto and albuterol  as needed.  Patient is well-controlled continue

## 2023-12-30 ENCOUNTER — Other Ambulatory Visit: Payer: Self-pay

## 2023-12-30 NOTE — Telephone Encounter (Signed)
 Can we let the patient know that the cancer center will not draw the labs I requested. Can we get him setup with a lab visit here. It will be a peripheral stick and not drawn from his port

## 2023-12-30 NOTE — Telephone Encounter (Signed)
 Contacted pt.  Relayed information. Pt verbalized understanding. Has no questions or concerns.  Pt is scheduled for lab visit 4/28 @845 .

## 2024-01-02 ENCOUNTER — Other Ambulatory Visit (INDEPENDENT_AMBULATORY_CARE_PROVIDER_SITE_OTHER)

## 2024-01-02 DIAGNOSIS — Z8673 Personal history of transient ischemic attack (TIA), and cerebral infarction without residual deficits: Secondary | ICD-10-CM | POA: Diagnosis not present

## 2024-01-02 DIAGNOSIS — Z125 Encounter for screening for malignant neoplasm of prostate: Secondary | ICD-10-CM

## 2024-01-02 DIAGNOSIS — Z131 Encounter for screening for diabetes mellitus: Secondary | ICD-10-CM | POA: Diagnosis not present

## 2024-01-02 LAB — PSA, MEDICARE: PSA: 1.49 ng/mL (ref 0.10–4.00)

## 2024-01-02 LAB — LIPID PANEL
Cholesterol: 204 mg/dL — ABNORMAL HIGH (ref 0–200)
HDL: 36 mg/dL — ABNORMAL LOW (ref >39.00)
LDL Cholesterol: 111 mg/dL — ABNORMAL HIGH (ref 0–99)
NonHDL: 167.7
Total CHOL/HDL Ratio: 6
Triglycerides: 285 mg/dL — ABNORMAL HIGH (ref 0.0–149.0)
VLDL: 57 mg/dL — ABNORMAL HIGH (ref 0.0–40.0)

## 2024-01-02 LAB — HEMOGLOBIN A1C: Hgb A1c MFr Bld: 6 % (ref 4.6–6.5)

## 2024-01-04 ENCOUNTER — Encounter: Payer: Self-pay | Admitting: Nurse Practitioner

## 2024-01-06 ENCOUNTER — Telehealth: Payer: Self-pay | Admitting: Nurse Practitioner

## 2024-01-06 DIAGNOSIS — E785 Hyperlipidemia, unspecified: Secondary | ICD-10-CM

## 2024-01-06 MED ORDER — EZETIMIBE 10 MG PO TABS: 10.0000 mg | ORAL_TABLET | Freq: Every day | ORAL | 3 refills | Status: DC

## 2024-01-06 NOTE — Telephone Encounter (Signed)
-----   Message from [facility] [place] sent at 01/05/2024  2:00 PM EDT ----- Called patient reviewed all information and repeated back to me. Will call if any questions.  He is taking the rosuvastatin  daily with no missed doses. He is ok with adding new medication would like sent to [facility] in [place]

## 2024-01-11 ENCOUNTER — Encounter: Payer: Self-pay | Admitting: Oncology

## 2024-01-11 ENCOUNTER — Inpatient Hospital Stay: Admitting: Oncology

## 2024-01-11 ENCOUNTER — Inpatient Hospital Stay: Admitting: Licensed Clinical Social Worker

## 2024-01-11 ENCOUNTER — Inpatient Hospital Stay

## 2024-01-11 ENCOUNTER — Inpatient Hospital Stay: Attending: Oncology

## 2024-01-11 VITALS — BP 108/78 | HR 77 | Temp 97.6°F | Resp 16 | Wt 159.5 lb

## 2024-01-11 VITALS — BP 129/94 | HR 68

## 2024-01-11 DIAGNOSIS — Z8673 Personal history of transient ischemic attack (TIA), and cerebral infarction without residual deficits: Secondary | ICD-10-CM | POA: Diagnosis not present

## 2024-01-11 DIAGNOSIS — Z791 Long term (current) use of non-steroidal anti-inflammatories (NSAID): Secondary | ICD-10-CM | POA: Insufficient documentation

## 2024-01-11 DIAGNOSIS — N529 Male erectile dysfunction, unspecified: Secondary | ICD-10-CM | POA: Insufficient documentation

## 2024-01-11 DIAGNOSIS — I6523 Occlusion and stenosis of bilateral carotid arteries: Secondary | ICD-10-CM | POA: Diagnosis not present

## 2024-01-11 DIAGNOSIS — Z79899 Other long term (current) drug therapy: Secondary | ICD-10-CM | POA: Diagnosis not present

## 2024-01-11 DIAGNOSIS — K4090 Unilateral inguinal hernia, without obstruction or gangrene, not specified as recurrent: Secondary | ICD-10-CM | POA: Diagnosis not present

## 2024-01-11 DIAGNOSIS — Z5112 Encounter for antineoplastic immunotherapy: Secondary | ICD-10-CM | POA: Diagnosis present

## 2024-01-11 DIAGNOSIS — Z8616 Personal history of COVID-19: Secondary | ICD-10-CM | POA: Insufficient documentation

## 2024-01-11 DIAGNOSIS — C3411 Malignant neoplasm of upper lobe, right bronchus or lung: Secondary | ICD-10-CM | POA: Diagnosis not present

## 2024-01-11 DIAGNOSIS — I700 Atherosclerosis of aorta: Secondary | ICD-10-CM | POA: Insufficient documentation

## 2024-01-11 DIAGNOSIS — Z87891 Personal history of nicotine dependence: Secondary | ICD-10-CM | POA: Diagnosis not present

## 2024-01-11 DIAGNOSIS — C7931 Secondary malignant neoplasm of brain: Secondary | ICD-10-CM | POA: Insufficient documentation

## 2024-01-11 DIAGNOSIS — J439 Emphysema, unspecified: Secondary | ICD-10-CM | POA: Diagnosis not present

## 2024-01-11 LAB — CBC WITH DIFFERENTIAL (CANCER CENTER ONLY)
Abs Immature Granulocytes: 0.11 10*3/uL — ABNORMAL HIGH (ref 0.00–0.07)
Basophils Absolute: 0.1 10*3/uL (ref 0.0–0.1)
Basophils Relative: 1 %
Eosinophils Absolute: 0.6 10*3/uL — ABNORMAL HIGH (ref 0.0–0.5)
Eosinophils Relative: 7 %
HCT: 42.7 % (ref 39.0–52.0)
Hemoglobin: 14.5 g/dL (ref 13.0–17.0)
Immature Granulocytes: 2 %
Lymphocytes Relative: 20 %
Lymphs Abs: 1.5 10*3/uL (ref 0.7–4.0)
MCH: 31 pg (ref 26.0–34.0)
MCHC: 34 g/dL (ref 30.0–36.0)
MCV: 91.4 fL (ref 80.0–100.0)
Monocytes Absolute: 0.7 10*3/uL (ref 0.1–1.0)
Monocytes Relative: 9 %
Neutro Abs: 4.6 10*3/uL (ref 1.7–7.7)
Neutrophils Relative %: 61 %
Platelet Count: 247 10*3/uL (ref 150–400)
RBC: 4.67 MIL/uL (ref 4.22–5.81)
RDW: 13.5 % (ref 11.5–15.5)
WBC Count: 7.5 10*3/uL (ref 4.0–10.5)
nRBC: 0 % (ref 0.0–0.2)

## 2024-01-11 LAB — CMP (CANCER CENTER ONLY)
ALT: 23 U/L (ref 0–44)
AST: 31 U/L (ref 15–41)
Albumin: 4 g/dL (ref 3.5–5.0)
Alkaline Phosphatase: 77 U/L (ref 38–126)
Anion gap: 6 (ref 5–15)
BUN: 22 mg/dL (ref 8–23)
CO2: 23 mmol/L (ref 22–32)
Calcium: 9.7 mg/dL (ref 8.9–10.3)
Chloride: 107 mmol/L (ref 98–111)
Creatinine: 1.23 mg/dL (ref 0.61–1.24)
GFR, Estimated: >60 mL/min (ref >60)
Glucose, Bld: 146 mg/dL — ABNORMAL HIGH (ref 70–99)
Potassium: 3.5 mmol/L (ref 3.5–5.1)
Sodium: 136 mmol/L (ref 135–145)
Total Bilirubin: 0.7 mg/dL (ref 0.0–1.2)
Total Protein: 6.9 g/dL (ref 6.5–8.1)

## 2024-01-11 LAB — TSH: TSH: 0.146 u[IU]/mL — ABNORMAL LOW (ref 0.350–4.500)

## 2024-01-11 MED ORDER — HEPARIN SOD (PORK) LOCK FLUSH 100 UNIT/ML IV SOLN
500.0000 [IU] | Freq: Once | INTRAVENOUS | Status: AC | PRN
  Administered 2024-01-11: 500 [IU]
  Filled 2024-01-11: qty 5

## 2024-01-11 MED ORDER — SODIUM CHLORIDE 0.9 % IV SOLN
Freq: Once | INTRAVENOUS | Status: AC
Start: 2024-01-11 — End: 2024-01-11
  Filled 2024-01-11: qty 250

## 2024-01-11 MED ORDER — SODIUM CHLORIDE 0.9 % IV SOLN
10.0000 mg/kg | Freq: Once | INTRAVENOUS | Status: AC
  Administered 2024-01-11: 740 mg via INTRAVENOUS
  Filled 2024-01-11: qty 4.8

## 2024-01-11 NOTE — Patient Instructions (Signed)
 [facility]  Discharge Instructions: Thank you for choosing [facility] to provide your oncology and hematology care.  If you have a lab appointment with the Cancer Center, please go directly to the Cancer Center and check in at the registration area.  Wear comfortable clothing and clothing appropriate for easy access to any Portacath or PICC line.   We strive to give you quality time with your provider. You may need to reschedule your appointment if you arrive late (15 or more minutes).  Arriving late affects you and other patients whose appointments are after yours.  Also, if you miss three or more appointments without notifying the office, you may be dismissed from the clinic at the provider's discretion.      For prescription refill requests, have your pharmacy contact our office and allow 72 hours for refills to be completed.    Today you received the following chemotherapy and/or immunotherapy agents Imfinzi      To help prevent nausea and vomiting after your treatment, we encourage you to take your nausea medication as directed.  BELOW ARE SYMPTOMS THAT SHOULD BE REPORTED IMMEDIATELY: *FEVER GREATER THAN 100.4 F (38 C) OR HIGHER *CHILLS OR SWEATING *NAUSEA AND VOMITING THAT IS NOT CONTROLLED WITH YOUR NAUSEA MEDICATION *UNUSUAL SHORTNESS OF BREATH *UNUSUAL BRUISING OR BLEEDING *URINARY PROBLEMS (pain or burning when urinating, or frequent urination) *BOWEL PROBLEMS (unusual diarrhea, constipation, pain near the anus) TENDERNESS IN MOUTH AND THROAT WITH OR WITHOUT PRESENCE OF ULCERS (sore throat, sores in mouth, or a toothache) UNUSUAL RASH, SWELLING OR PAIN  UNUSUAL VAGINAL DISCHARGE OR ITCHING   Items with * indicate a potential emergency and should be followed up as soon as possible or go to the Emergency Department if any problems should occur.  Please show the CHEMOTHERAPY ALERT CARD or IMMUNOTHERAPY  ALERT CARD at check-in to the Emergency Department and triage nurse.  Should you have questions after your visit or need to cancel or reschedule your appointment, please contact [facility] - A DEPT OF [facility]  [phone] and follow the prompts.  Office hours are 8:00 a.m. to 4:30 p.m. Monday - Friday. Please note that voicemails left after 4:00 p.m. may not be returned until the following business day.  We are closed weekends and major holidays. You have access to a nurse at all times for urgent questions. Please call the main number to the clinic [phone] and follow the prompts.  For any non-urgent questions, you may also contact your provider using MyChart. We now offer e-Visits for anyone 1 and older to request care online for non-urgent symptoms. For details visit [email or web address].   Also download the MyChart app! Go to the app store, search "MyChart", open the app, select [place], and log in with your MyChart username and password.

## 2024-01-11 NOTE — Progress Notes (Signed)
 [facility] Clinical Social Work  Clinical Social Work was referred by nurse for assessment of psychosocial needs.  Clinical Social Worker contacted patient by phone to offer support and assess for needs.    Patient reported to staff that he was considering stopping his treatment due to cost.  Spoke with patient regarding his concerns.  Provided him with the phone number to [employer] so that they could explore possible grants.  He stated he had no other needs.     Kennth Peal, LCSW  Clinical Social Worker [facility]

## 2024-01-11 NOTE — Progress Notes (Signed)
 Patient is having some financial trouble with getting his treatments, so he said that he will have to stop getting his treatments. I am going to send a message to lynn Duffy to see about him getting some help financially.

## 2024-01-11 NOTE — Progress Notes (Signed)
 [facility]  Telephone:([phone] [phone]  ID: Ethan Fitzgerald OB: 15-Nov-1955  MR#: [id number]  MVH#:[id number]  Patient Care Team: Dorothe Gaster, NP as PCP - General (Nurse Practitioner) Lisabeth Rider, MD as Referring Physician (Neurology) Drake Gens, RN as Oncology Nurse Navigator Adrian Alba, Deadra Everts, MD as Consulting Physician (Oncology)  CHIEF COMPLAINT: Stage IIIa adenocarcinoma of the right upper lobe lung.  INTERVAL HISTORY: Patient returns to clinic today for further evaluation and consideration of cycle 16 of maintenance durvalumab .  He reports increasing financial difficulties secondary to the cost of treatment, but otherwise feels well.  He continues to tolerate his treatments well without significant side effects. He has no new neurologic complaints.  He does not complain of pain today.   He denies any recent fevers or illnesses. He denies any chest pain, shortness of breath, cough, or hemoptysis.  He has no nausea, vomiting, constipation, or diarrhea.  He has no urinary complaints.  Patient offers no further specific complaints today.  REVIEW OF SYSTEMS:   Review of Systems  Constitutional: Negative.  Negative for fever, malaise/fatigue and weight loss.  Respiratory: Negative.  Negative for cough, hemoptysis and shortness of breath.   Cardiovascular: Negative.  Negative for chest pain and leg swelling.  Gastrointestinal: Negative.  Negative for abdominal pain.  Genitourinary: Negative.  Negative for dysuria.  Musculoskeletal: Negative.  Negative for back pain.  Skin: Negative.  Negative for rash.  Neurological: Negative.  Negative for dizziness, focal weakness, weakness and headaches.  Psychiatric/Behavioral: Negative.  The patient is not nervous/anxious.   All other systems reviewed and are negative.   As per HPI. Otherwise, a complete review of systems is negative.  PAST MEDICAL HISTORY: Past Medical History:  Diagnosis Date    COPD (chronic obstructive pulmonary disease) (HCC)    mild   COVID-19 09/2019   Depression    Erectile dysfunction    Headache    daily, stress headaches   Knee pain, left    Seasonal allergies    Stroke (HCC)    Tobacco dependence     PAST SURGICAL HISTORY: Past Surgical History:  Procedure Laterality Date   BACK SURGERY     metal plate in back   COLONOSCOPY     COLONOSCOPY WITH PROPOFOL  N/A 04/26/2019   Procedure: COLONOSCOPY WITH PROPOFOL ;  Surgeon: Luke Salaam, MD;  Location: [facility] ENDOSCOPY;  Service: Gastroenterology;  Laterality: N/A;   FINE NEEDLE ASPIRATION  03/03/2023   Procedure: FINE NEEDLE ASPIRATION;  Surgeon: Vergia Glasgow, MD;  Location: [facility] ENDOSCOPY;  Service: Pulmonary;;   HERNIA REPAIR Right    IR ANGIO INTRA EXTRACRAN SEL COM CAROTID INNOMINATE UNI L MOD SED  03/27/2020   IR ANGIO VERTEBRAL SEL SUBCLAVIAN INNOMINATE UNI L MOD SED  03/27/2020   IR ANGIO VERTEBRAL SEL VERTEBRAL UNI R MOD SED  03/27/2020   IR CT HEAD LTD  03/27/2020   IR IMAGING GUIDED PORT INSERTION  03/29/2023   IR PERCUTANEOUS ART THROMBECTOMY/INFUSION INTRACRANIAL INC DIAG ANGIO  03/27/2020   KNEE ARTHROSCOPY Left 03/28/2015   Procedure: Arthroscopic partial medial meniscectomy plus chondral debridement;  Surgeon: Josephus Nida, MD;  Location: [facility];  Service: Orthopedics;  Laterality: Left;   RADIOLOGY WITH ANESTHESIA N/A 03/27/2020   Procedure: IR WITH ANESTHESIA;  Surgeon: Luellen Sages, MD;  Location: [facility] OR;  Service: Radiology;  Laterality: N/A;   VIDEO BRONCHOSCOPY WITH ENDOBRONCHIAL ULTRASOUND  03/03/2023   Procedure: VIDEO BRONCHOSCOPY WITH ENDOBRONCHIAL ULTRASOUND;  Surgeon: Vergia Glasgow, MD;  Location: [facility] ENDOSCOPY;  Service: Pulmonary;;    FAMILY HISTORY: Family History  Problem Relation Age of Onset   Diabetes Mother    Heart attack Mother 46   Cancer Father        liver    COPD Brother    HIV/AIDS Brother    Prostate cancer Neg Hx    Colon cancer  Neg Hx     ADVANCED DIRECTIVES (Y/N):  N  HEALTH MAINTENANCE: Social History   Tobacco Use   Smoking status: Former    Current packs/day: 0.00    Average packs/day: 1.5 packs/day for 49.0 years (73.5 ttl pk-yrs)    Types: Cigarettes    Start date: 54    Quit date: 09/14/2017    Years since quitting: 6.3   Smokeless tobacco: Former  Building services engineer status: Never Used  Substance Use Topics   Alcohol use: Not Currently    Comment: 6 pack on weekend   Drug use: No     Colonoscopy:  PAP:  Bone density:  Lipid panel:  No Known Allergies  Current Outpatient Medications  Medication Sig Dispense Refill   albuterol  (VENTOLIN  HFA) 108 (90 Base) MCG/ACT inhaler Inhale 2 puffs into the lungs every 4 (four) hours as needed for wheezing or shortness of breath. 1 each 2   aspirin  EC 325 MG EC tablet Take 1 tablet (325 mg total) by mouth daily. 90 tablet 0   b complex vitamins capsule Take 1 capsule by mouth daily.     Cholecalciferol (VITAMIN D ) 50 MCG (2000 UT) tablet Take 2,000 Units by mouth daily.     ezetimibe (ZETIA) 10 MG tablet Take 1 tablet (10 mg total) by mouth daily. 90 tablet 3   fluticasone  (FLONASE ) 50 MCG/ACT nasal spray Place 2 sprays into both nostrils daily. (Patient taking differently: Place 2 sprays into both nostrils daily as needed for allergies.) 48 g 3   melatonin 5 MG TABS Take 5 mg by mouth at bedtime as needed (sleep).     midodrine  (PROAMATINE ) 5 MG tablet Take 1 tablet (5 mg total) by mouth 3 (three) times daily with meals. 270 tablet 1   Multiple Vitamin (MULTIVITAMIN WITH MINERALS) TABS tablet Take 1 tablet by mouth daily.     Multiple Vitamins-Minerals (IMMUNE SUPPORT VITAMIN C PO) Take 1 tablet by mouth daily.     naproxen  (NAPROSYN ) 500 MG tablet Take 1 tablet (500 mg total) by mouth 2 (two) times daily with a meal. 10 tablet 0   Oxycodone  HCl 10 MG TABS Take 1 tablet (10 mg total) by mouth every 6 (six) hours as needed. Take with stool softeners to  avoid constipation 120 tablet 0   polyethylene glycol (MIRALAX ) 17 g packet Take 17 g by mouth 2 (two) times daily as needed for mild constipation or moderate constipation (Hold if diarrhea.). 100 each 2   rosuvastatin  (CRESTOR ) 40 MG tablet Take 1 tablet (40 mg total) by mouth every other day. 45 tablet 3   senna (SENOKOT) 8.6 MG TABS tablet Take 1-2 tablets (8.6-17.2 mg total) by mouth 2 (two) times daily as needed for mild constipation or moderate constipation (Hold if diarrhea). 120 tablet 0   sildenafil  (REVATIO ) 20 MG tablet TAKE 1 TO 5 TABLETS BY MOUTH ABOUT 30 MINUTES PRIOR TO SEX.START WITH 1 THEN INCREASE 30 tablet 5   STIOLTO RESPIMAT  2.5-2.5 MCG/ACT AERS INHALE 2 PUFFS EVERY DAY 12 g 3   sucralfate  (CARAFATE ) 1 g tablet Take 1  tablet (1 g total) by mouth 3 (three) times daily before meals. 90 tablet 1   traMADol (ULTRAM) 50 MG tablet Take 50 mg by mouth every 6 (six) hours as needed.     No current facility-administered medications for this visit.   Facility-Administered Medications Ordered in Other Visits  Medication Dose Route Frequency Provider Last Rate Last Admin   0.9 %  sodium chloride  infusion   Intravenous Once Dary Dilauro J, MD       durvalumab  (IMFINZI ) 740 mg in sodium chloride  0.9 % 100 mL chemo infusion  10 mg/kg (Treatment Plan Recorded) Intravenous Once Maryrose Colvin J, MD       heparin  lock flush 100 unit/mL  500 Units Intracatheter Once PRN Shellie Dials, MD        OBJECTIVE: Vitals:   01/11/24 0827  BP: 108/78  Pulse: 77  Resp: 16  Temp: 97.6 F (36.4 C)  SpO2: 100%       Body mass index is 22.72 kg/m.    ECOG FS:0 - Asymptomatic  General: Well-developed, well-nourished, no acute distress. Eyes: Pink conjunctiva, anicteric sclera. HEENT: Normocephalic, moist mucous membranes. Lungs: No audible wheezing or coughing. Heart: Regular rate and rhythm. Abdomen: Soft, nontender, no obvious distention. Musculoskeletal: No edema, cyanosis,  or clubbing. Neuro: Alert, answering all questions appropriately. Cranial nerves grossly intact. Skin: No rashes or petechiae noted. Psych: Normal affect.  LAB RESULTS:  Lab Results  Component Value Date   NA 136 01/11/2024   K 3.5 01/11/2024   CL 107 01/11/2024   CO2 23 01/11/2024   GLUCOSE 146 (H) 01/11/2024   BUN 22 01/11/2024   CREATININE 1.23 01/11/2024   CALCIUM  9.7 01/11/2024   PROT 6.9 01/11/2024   ALBUMIN 4.0 01/11/2024   AST 31 01/11/2024   ALT 23 01/11/2024   ALKPHOS 77 01/11/2024   BILITOT 0.7 01/11/2024   GFRNONAA >60 01/11/2024   GFRAA 85 12/22/2020    Lab Results  Component Value Date   WBC 7.5 01/11/2024   NEUTROABS 4.6 01/11/2024   HGB 14.5 01/11/2024   HCT 42.7 01/11/2024   MCV 91.4 01/11/2024   PLT 247 01/11/2024     STUDIES: No results found.   ASSESSMENT: Stage IIIa adenocarcinoma of the right upper lobe lung.  PLAN:    Stage IIIa adenocarcinoma of the right upper lobe lung: Pathology and imaging reviewed independently confirming diagnosis and stage of disease.  PET scan results from February 14, 2023 reviewed independently with no obvious evidence of metastatic disease.  MRI of the brain from February 11, 2023 also negative for metastatic disease.  Surgical resection was not an option, therefore patient proceeded with concurrent XRT along with weekly carboplatin  and Taxol  which was completed on May 27, 2023.  Patient initiated his year-long maintenance durvalumab  on June 08, 2023.  Repeat PET scan on August 17, 2023 reviewed independently with significant improvement of disease burden.  Patient had a CT scan ordered by radiation oncology completed on October 14, 2023 with no evidence of disease.  Proceed with cycle 16 of treatment today.  Patient has stated he wishes to discontinue treatment secondary to financial issues and a referral has been sent to social work for assistance.  Will schedule his next treatment in 2 weeks as well as follow-up in  4 weeks, but these will be pending based on financial status.  Consider repeat imaging in August 2025. Chest pain: Patient does not complain of this today.  Continue oxycodone  as needed. Iron  deficiency  anemia: Resolved.  He last received IV Venofer  on May 25, 2023. Dizziness: Patient does not complain of this today.  A referral was previously sent to ENT. History of CVA: Patient reports he is on aspirin  325 mg daily.  I spent a total of 30 minutes reviewing chart data, face-to-face evaluation with the patient, counseling and coordination of care as detailed above.    Patient expressed understanding and was in agreement with this plan. He also understands that He can call clinic at any time with any questions, concerns, or complaints.    Cancer Staging  Adenocarcinoma of upper lobe of right lung [facility]) Staging form: Lung, AJCC 8th Edition - Clinical stage from 03/15/2023: Stage IIIA (cT4, cN0, cM0) - Signed by Shellie Dials, MD on 03/15/2023 Stage prefix: Initial diagnosis   Shellie Dials, MD   01/11/2024 9:14 AM

## 2024-01-12 ENCOUNTER — Telehealth: Payer: Self-pay | Admitting: *Deleted

## 2024-01-12 LAB — T4: T4, Total: 6.6 ug/dL (ref 4.5–12.0)

## 2024-01-12 NOTE — Telephone Encounter (Signed)
 Based on the patient calling saying that someone gave him information to call when about helping with grants.  I called the number he left and got his voicemail and I told him that I will give him the telephone number for Oletta Berry [phone] and also she is not here today but I said he could you leave a message and she can get back with him

## 2024-01-17 ENCOUNTER — Telehealth: Payer: Self-pay | Admitting: *Deleted

## 2024-01-17 NOTE — Telephone Encounter (Signed)
 Pt called and left triage message that he had lost number given to him by social worker for medication assistance.  RN reached out to [employer] and number given.  RN called patient with number for [employer] for medication assistance [phone].

## 2024-01-18 ENCOUNTER — Emergency Department

## 2024-01-18 ENCOUNTER — Encounter: Payer: Self-pay | Admitting: Emergency Medicine

## 2024-01-18 ENCOUNTER — Ambulatory Visit: Payer: Self-pay

## 2024-01-18 ENCOUNTER — Telehealth: Payer: Self-pay | Admitting: Adult Health

## 2024-01-18 ENCOUNTER — Other Ambulatory Visit: Payer: Self-pay

## 2024-01-18 ENCOUNTER — Emergency Department
Admission: EM | Admit: 2024-01-18 | Discharge: 2024-01-18 | Disposition: A | Discharge status: Other Acute Inpatient Hospital | Attending: Emergency Medicine | Admitting: Emergency Medicine

## 2024-01-18 DIAGNOSIS — Z85118 Personal history of other malignant neoplasm of bronchus and lung: Secondary | ICD-10-CM | POA: Insufficient documentation

## 2024-01-18 DIAGNOSIS — G939 Disorder of brain, unspecified: Secondary | ICD-10-CM | POA: Diagnosis not present

## 2024-01-18 DIAGNOSIS — I6523 Occlusion and stenosis of bilateral carotid arteries: Secondary | ICD-10-CM | POA: Diagnosis not present

## 2024-01-18 DIAGNOSIS — Z9221 Personal history of antineoplastic chemotherapy: Secondary | ICD-10-CM | POA: Diagnosis not present

## 2024-01-18 DIAGNOSIS — J439 Emphysema, unspecified: Secondary | ICD-10-CM | POA: Insufficient documentation

## 2024-01-18 DIAGNOSIS — K8020 Calculus of gallbladder without cholecystitis without obstruction: Secondary | ICD-10-CM | POA: Diagnosis not present

## 2024-01-18 DIAGNOSIS — I700 Atherosclerosis of aorta: Secondary | ICD-10-CM | POA: Diagnosis not present

## 2024-01-18 DIAGNOSIS — G9389 Other specified disorders of brain: Secondary | ICD-10-CM | POA: Diagnosis not present

## 2024-01-18 DIAGNOSIS — Z8673 Personal history of transient ischemic attack (TIA), and cerebral infarction without residual deficits: Secondary | ICD-10-CM | POA: Diagnosis not present

## 2024-01-18 DIAGNOSIS — R42 Dizziness and giddiness: Secondary | ICD-10-CM | POA: Diagnosis present

## 2024-01-18 LAB — COMPREHENSIVE METABOLIC PANEL WITH GFR
ALT: 21 U/L (ref 0–44)
AST: 26 U/L (ref 15–41)
Albumin: 3.8 g/dL (ref 3.5–5.0)
Alkaline Phosphatase: 76 U/L (ref 38–126)
Anion gap: 6 (ref 5–15)
BUN: 20 mg/dL (ref 8–23)
CO2: 24 mmol/L (ref 22–32)
Calcium: 9.8 mg/dL (ref 8.9–10.3)
Chloride: 108 mmol/L (ref 98–111)
Creatinine, Ser: 0.89 mg/dL (ref 0.61–1.24)
GFR, Estimated: >60 mL/min (ref >60)
Glucose, Bld: 119 mg/dL — ABNORMAL HIGH (ref 70–99)
Potassium: 4.1 mmol/L (ref 3.5–5.1)
Sodium: 138 mmol/L (ref 135–145)
Total Bilirubin: 1.4 mg/dL — ABNORMAL HIGH (ref 0.0–1.2)
Total Protein: 7.2 g/dL (ref 6.5–8.1)

## 2024-01-18 LAB — APTT: aPTT: 27 s (ref 24–36)

## 2024-01-18 LAB — CBC
HCT: 44.7 % (ref 39.0–52.0)
Hemoglobin: 15.4 g/dL (ref 13.0–17.0)
MCH: 31.4 pg (ref 26.0–34.0)
MCHC: 34.5 g/dL (ref 30.0–36.0)
MCV: 91.2 fL (ref 80.0–100.0)
Platelets: 253 10*3/uL (ref 150–400)
RBC: 4.9 MIL/uL (ref 4.22–5.81)
RDW: 13.7 % (ref 11.5–15.5)
WBC: 10.5 10*3/uL (ref 4.0–10.5)
nRBC: 0 % (ref 0.0–0.2)

## 2024-01-18 LAB — PROTIME-INR
INR: 1 (ref 0.8–1.2)
Prothrombin Time: 13.2 s (ref 11.4–15.2)

## 2024-01-18 MED ORDER — ONDANSETRON HCL 4 MG/2ML IJ SOLN
4.0000 mg | Freq: Once | INTRAMUSCULAR | Status: AC
Start: 2024-01-18 — End: 2024-01-18
  Administered 2024-01-18: 4 mg via INTRAVENOUS
  Filled 2024-01-18: qty 2

## 2024-01-18 MED ORDER — GADOBUTROL 1 MMOL/ML IV SOLN
7.0000 mL | Freq: Once | INTRAVENOUS | Status: AC | PRN
  Administered 2024-01-18: 7 mL via INTRAVENOUS

## 2024-01-18 MED ORDER — DEXAMETHASONE SODIUM PHOSPHATE 10 MG/ML IJ SOLN
10.0000 mg | Freq: Once | INTRAMUSCULAR | Status: AC
  Administered 2024-01-18: 10 mg via INTRAVENOUS
  Filled 2024-01-18: qty 1

## 2024-01-18 MED ORDER — SODIUM CHLORIDE 0.9 % IV SOLN: Freq: Once | INTRAVENOUS | Status: AC

## 2024-01-18 MED ORDER — SODIUM CHLORIDE 0.9 % IV BOLUS: 500.0000 mL | Freq: Once | INTRAVENOUS | Status: DC

## 2024-01-18 MED ORDER — IOHEXOL 300 MG/ML  SOLN
100.0000 mL | Freq: Once | INTRAMUSCULAR | Status: AC | PRN
  Administered 2024-01-18: 100 mL via INTRAVENOUS

## 2024-01-18 NOTE — Telephone Encounter (Signed)
 Called the pt's wife back. Pt woke up this morning suddenly sick to stomach vomitting and not able to eat anything. He has had sips of 7 up and petpto and as of the time of this call the last time he vomited was hr ago.  She states that over last 2 wks he has been having on and off headache that has consistently been occurring. And over last 3/4 wks he has complained of increase in dizziness. She states that some days he gets around fine and no worries and then other days he has to use a cane which he had not been having to do that. He was suppose to have a follow up for 6 mths so advised we could get on the schedule as a follow up but in regard to concerns whether this is urgent I advised he likely will need to touch base with PCP and ensure nothing else is going on. Advised that if he has a headache that is so bad "worse headache of life"  along with the vomiting he would need to then go to urgent/ER care. Patient states in background headache is mild. They will touch base with us  if they can make the appointment tomorrow.

## 2024-01-18 NOTE — ED Notes (Addendum)
 Report given to [facility] ED Charge RN Dani Dupont., RN.

## 2024-01-18 NOTE — ED Notes (Signed)
 Pt transported with [facility] at this time to [facility] ED. Pt spouse left with pt belongings. Pt ABCs intact. RR even and unlabored. Pt in NAD.

## 2024-01-18 NOTE — ED Notes (Signed)
 Pt discussed with Martina Sledge, MD. No Code Stroke at this time.

## 2024-01-18 NOTE — ED Notes (Signed)
 Pt accepted Ed to Ed @ [facility] [employer] on [address], [place] [address] /Accepting Dr Velvet Gibbs [phone].

## 2024-01-18 NOTE — Telephone Encounter (Signed)
 Pt wife called in regards to get  an appointment as soon as possible .Pt been throwing up since this morning .Wife gave PT  pepto bismol and 7 up since 8 this morning Pt is Having dizzy spell and this has not happen since he had stroke 4 years ago , Pt  is also having really bad Headache.  Wife really want pt to be seen soon  she is very concern.

## 2024-01-18 NOTE — ED Notes (Signed)
 ..  [employer]: REQUIRED DOCUMENTATION COMPLETED AND REVIEWED BY WRITER PRIOR TO PT TRANSFER MD REASSESSMENT [employer] RN SECTION TRANSFER E-SIGN VS WITHIN REQUIRED TIME

## 2024-01-18 NOTE — Telephone Encounter (Signed)
 Pt wife callback to inform that she is on her way to Emergency room. Wife states she will call back to schedule appt.

## 2024-01-18 NOTE — ED Notes (Signed)
 Called to [facility] @822pm  per MD Kinner/Consult to Transfer/Images Powershared & Facesheet Faxed/Rep Isa Manuel

## 2024-01-18 NOTE — ED Triage Notes (Signed)
 Patient to ED via POV for dizziness, headache and vomiting. Dizziness ongoing for a couple days and headache started Monday. PT reports blurred vision in the left eye- pt stating that he just noticed at this time. Has facial droop from previous stroke. Takes a daily ASA. Currently has lung cancer.

## 2024-01-18 NOTE — ED Notes (Signed)
 Called to [facility] for transport to [facility] ED to ED/Rep Tammy.

## 2024-01-18 NOTE — ED Notes (Signed)
EDP at bedside examining pt. °

## 2024-01-18 NOTE — ED Notes (Signed)
 Pt to MRI at this time. Pt ABCs intact. RR even and unlabored. Pt in NAD.

## 2024-01-18 NOTE — Telephone Encounter (Signed)
 Information obtained from the wife in presence of pt.   Chief Complaint: dizziness Symptoms: N/V, dizzy, HA, Frequency:  Pertinent Negatives: Patient denies vision changes, speech changes,  Disposition: [x] ED /[] Urgent Care (no appt availability in office) / [] Appointment(In office/virtual)/ []  [facility]/ [] Home Care/ [] Refused Recommended Disposition /[] [facility]/ []  Follow-up with PCP Additional Notes: Pt c/o N/V, and missing his steps. Wife states that the dizziness started about a month ago and it has worsened over night. Pt states 3/10 HA. States dizziness is better when supine. Pt with hx of cancer and CVA. Pt states that he feels off balance. Wife states that pt has had numerous episodes of vomiting. Pt is receiving cancer treatments at this time. Pt and wife agreeable to seeking ED evaluation. Copied from CRM [id number]. Topic: Clinical - Red Word Triage >> Jan 18, 2024  3:09 PM Albertha Alosa wrote: Red Word that prompted transfer to Nurse Triage: Patient wife called in stating he had a stroke 4 years ago, and now he is having headaches, dizziness and throwing up and needs to be seen by his pcp Reason for Disposition  SEVERE dizziness (vertigo) (e.g., unable to walk without assistance)  Answer Assessment - Initial Assessment Questions 1. DESCRIPTION: "Describe your dizziness."     Off balanced 2. VERTIGO: "Do you feel like either you or the room is spinning or tilting?"      Off balanced 4. SEVERITY: "How bad is it?"  "Can you walk?"   - MILD: Feels slightly dizzy and unsteady, but is walking normally.   - MODERATE: Feels unsteady when walking, but not falling; interferes with normal activities (e.g., school, work).   - SEVERE: Unable to walk without falling, or requires assistance to walk without falling.     Moderate  5. ONSET:  "When did the dizziness begin?"     About a week ago, worse today 6. AGGRAVATING FACTORS: "Does anything make it worse?" (e.g.,  standing, change in head position)     Better when supine 7. CAUSE: "What do you think is causing the dizziness?"     Unsure, CVA 8. RECURRENT SYMPTOM: "Have you had dizziness before?" If Yes, ask: "When was the last time?" "What happened that time?"     Yes history  9. OTHER SYMPTOMS: "Do you have any other symptoms?" (e.g., headache, weakness, numbness, vomiting, earache)     N/V, headache  Protocols used: Dizziness - Vertigo-A-AH

## 2024-01-18 NOTE — Telephone Encounter (Signed)
Agree with ED disposition.

## 2024-01-18 NOTE — ED Notes (Signed)
 EDP to come evaluate pt.

## 2024-01-18 NOTE — ED Provider Notes (Signed)
 [facility] Provider Note    Event Date/Time   First MD Initiated Contact with Patient 01/18/24 1710     (approximate)   History   Dizziness   HPI  Ethan Fitzgerald is a 68 y.o. male with a history of stage III lung CA reports he finished chemotherapy about 6 months ago who presents with complaints of dizziness over the last 7 to 10 days, development of headache 2 days ago and then nausea and vomiting since this morning.  No neurodeficits reported     Physical Exam   Triage Vital Signs: ED Triage Vitals [01/18/24 1712]  Encounter Vitals Group     BP (!) 145/90     Systolic BP Percentile      Diastolic BP Percentile      Pulse Rate 78     Resp 17     Temp 98.3 F (36.8 C)     Temp Source Oral     SpO2 100 %     Weight 73 kg (160 lb 15 oz)     Height 1.778 m (5\' 10" )     Head Circumference      Peak Flow      Pain Score 4     Pain Loc      Pain Education      Exclude from Growth Chart     Most recent vital signs: Vitals:   01/18/24 1712 01/18/24 1715  BP: (!) 145/90   Pulse: 78 78  Resp: 17 17  Temp: 98.3 F (36.8 C)   SpO2: 100% 100%     General: Awake, no distress.  CV:  Good peripheral perfusion.  Resp:  Normal effort.  Abd:  No distention.  Other:  Chronic facial droop, no new neurodeficits on exam   ED Results / Procedures / Treatments   Labs (all labs ordered are listed, but only abnormal results are displayed) Labs Reviewed  COMPREHENSIVE METABOLIC PANEL WITH GFR - Abnormal; Notable for the following components:      Result Value   Glucose, Bld 119 (*)    Total Bilirubin 1.4 (*)    All other components within normal limits  CBC  APTT  PROTIME-INR     EKG  ED ECG REPORT I, Bryson Carbine, the attending physician, personally viewed and interpreted this ECG.  Date: 01/18/2024  Rhythm: normal sinus rhythm QRS Axis: normal Intervals: normal ST/T Wave abnormalities: normal Narrative Interpretation: no  evidence of acute ischemia    RADIOLOGY Contacted by radiologist and notified of large mass in the left cerebellum, suspicious for metastatic disease    PROCEDURES:  Critical Care performed:   Procedures   MEDICATIONS ORDERED IN ED: Medications  ondansetron  (ZOFRAN ) injection 4 mg (4 mg Intravenous Given 01/18/24 1738)  0.9 %  sodium chloride  infusion (0 mLs Intravenous Stopped 01/18/24 1854)  dexamethasone  (DECADRON ) injection 10 mg (10 mg Intravenous Given 01/18/24 1852)  gadobutrol  (GADAVIST ) 1 MMOL/ML injection 7 mL (7 mLs Intravenous Contrast Given 01/18/24 1958)  iohexol  (OMNIPAQUE ) 300 MG/ML solution 100 mL (100 mLs Intravenous Contrast Given 01/18/24 2021)     IMPRESSION / MDM / ASSESSMENT AND PLAN / ED COURSE  I reviewed the triage vital signs and the nursing notes. Patient's presentation is most consistent with acute presentation with potential threat to life or bodily function.  Patient with known lung CA, history of CVA presents with dizziness, headache, nausea vomiting.  Differential includes CVA, viral illness, metastasis  Will send for CT scan,  give IV Zofran , IV fluids, obtain labs and reevaluate.  Contacted by radiologist and notified of cerebellar mass suspicious for metastases.  Will order MRI with and without contrast, have discussed with Dr. Felipe Horton of neurosurgery, he recommends Decadron  10 mg IV followed by 4 every 4 hours  Will consult oncology as well. Dr. Valentine Gasmen recommends CT chest abd/pelvis  Dr. Felipe Horton has seen the MRI, recommends transfer for [facility] for likely resection  Discussed with [facility] transfer center, accepted for transfer ED to ED      FINAL CLINICAL IMPRESSION(S) / ED DIAGNOSES   Final diagnoses:  Brain mass     Rx / DC Orders   ED Discharge Orders     None        Note:  This document was prepared using Dragon voice recognition software and may include unintentional dictation errors.   Bryson Carbine, MD 01/18/24 2047

## 2024-01-19 ENCOUNTER — Telehealth: Payer: Self-pay | Admitting: *Deleted

## 2024-01-19 ENCOUNTER — Other Ambulatory Visit: Payer: Self-pay | Admitting: Oncology

## 2024-01-19 NOTE — Telephone Encounter (Signed)
 Patient's wife would like an urgent appointment with Dr. Adrian Alba to discuss new diagnosis of brain mets.

## 2024-01-19 NOTE — Telephone Encounter (Signed)
 Wife has called Gabriel John, RN back to inform pt was taken to ED last night, pt at [facility], has a tumor on brain, 99% likely cancer, wife not asking , but welcomes a call back if [place], [place] wants or needs to call.

## 2024-01-20 ENCOUNTER — Encounter: Payer: Self-pay | Admitting: Oncology

## 2024-01-20 ENCOUNTER — Inpatient Hospital Stay: Admitting: Oncology

## 2024-01-20 ENCOUNTER — Other Ambulatory Visit: Payer: Self-pay | Admitting: *Deleted

## 2024-01-20 ENCOUNTER — Encounter: Payer: Self-pay | Admitting: *Deleted

## 2024-01-20 VITALS — BP 101/76 | HR 73 | Temp 97.0°F | Resp 16 | Ht 70.0 in | Wt 160.0 lb

## 2024-01-20 DIAGNOSIS — C3411 Malignant neoplasm of upper lobe, right bronchus or lung: Secondary | ICD-10-CM

## 2024-01-20 DIAGNOSIS — Z5112 Encounter for antineoplastic immunotherapy: Secondary | ICD-10-CM | POA: Diagnosis not present

## 2024-01-20 NOTE — Progress Notes (Signed)
 [facility]  Telephone:([phone]) [phone] Fax:(336) [phone]  ID: Ethan Fitzgerald OB: Jan 10, 1956  MR#: [id number]  AOZ#:[id number]  Patient Care Team: Dorothe Gaster, NP as PCP - General (Nurse Practitioner) Lisabeth Rider, MD as Referring Physician (Neurology) Drake Gens, RN as Oncology Nurse Navigator Adrian Alba, Deadra Everts, MD as Consulting Physician (Oncology)  CHIEF COMPLAINT: Stage IIIa adenocarcinoma of the right upper lobe lung, now with isolated brain metastasis.  INTERVAL HISTORY: Patient returns to clinic today as an add-on after presented to the emergency room with new onset dizziness, headache, and nausea.  Subsequent MRI revealed a 3 cm lesion in the left cerebellum.  Patient was transferred to [facility], but refused to undergo surgery until discussing with medical oncology.  He was initiated on steroids and his symptoms has essentially resolved and he feels back to his baseline.  He has no new neurologic complaints.  He does not complain of pain today.   He denies any recent fevers or illnesses. He denies any chest pain, shortness of breath, cough, or hemoptysis.  He has no nausea, vomiting, constipation, or diarrhea.  He has no urinary complaints.  Patient offers no further specific complaints today.  REVIEW OF SYSTEMS:   Review of Systems  Constitutional: Negative.  Negative for fever, malaise/fatigue and weight loss.  Respiratory: Negative.  Negative for cough, hemoptysis and shortness of breath.   Cardiovascular: Negative.  Negative for chest pain and leg swelling.  Gastrointestinal: Negative.  Negative for abdominal pain.  Genitourinary: Negative.  Negative for dysuria.  Musculoskeletal: Negative.  Negative for back pain.  Skin: Negative.  Negative for rash.  Neurological: Negative.  Negative for dizziness, focal weakness, weakness and headaches.  Psychiatric/Behavioral: Negative.  The patient is not nervous/anxious.   All other systems reviewed and  are negative.   As per HPI. Otherwise, a complete review of systems is negative.  PAST MEDICAL HISTORY: Past Medical History:  Diagnosis Date   COPD (chronic obstructive pulmonary disease) (HCC)    mild   COVID-19 09/2019   Depression    Erectile dysfunction    Headache    daily, stress headaches   Knee pain, left    Seasonal allergies    Stroke (HCC)    Tobacco dependence     PAST SURGICAL HISTORY: Past Surgical History:  Procedure Laterality Date   BACK SURGERY     metal plate in back   COLONOSCOPY     COLONOSCOPY WITH PROPOFOL  N/A 04/26/2019   Procedure: COLONOSCOPY WITH PROPOFOL ;  Surgeon: Luke Salaam, MD;  Location: [facility] ENDOSCOPY;  Service: Gastroenterology;  Laterality: N/A;   FINE NEEDLE ASPIRATION  03/03/2023   Procedure: FINE NEEDLE ASPIRATION;  Surgeon: Vergia Glasgow, MD;  Location: [facility] ENDOSCOPY;  Service: Pulmonary;;   HERNIA REPAIR Right    IR ANGIO INTRA EXTRACRAN SEL COM CAROTID INNOMINATE UNI L MOD SED  03/27/2020   IR ANGIO VERTEBRAL SEL SUBCLAVIAN INNOMINATE UNI L MOD SED  03/27/2020   IR ANGIO VERTEBRAL SEL VERTEBRAL UNI R MOD SED  03/27/2020   IR CT HEAD LTD  03/27/2020   IR IMAGING GUIDED PORT INSERTION  03/29/2023   IR PERCUTANEOUS ART THROMBECTOMY/INFUSION INTRACRANIAL INC DIAG ANGIO  03/27/2020   KNEE ARTHROSCOPY Left 03/28/2015   Procedure: Arthroscopic partial medial meniscectomy plus chondral debridement;  Surgeon: Josephus Nida, MD;  Location: [facility];  Service: Orthopedics;  Laterality: Left;   RADIOLOGY WITH ANESTHESIA N/A 03/27/2020   Procedure: IR WITH ANESTHESIA;  Surgeon: Luellen Sages, MD;  Location: [facility] OR;  Service: Radiology;  Laterality: N/A;   VIDEO BRONCHOSCOPY WITH ENDOBRONCHIAL ULTRASOUND  03/03/2023   Procedure: VIDEO BRONCHOSCOPY WITH ENDOBRONCHIAL ULTRASOUND;  Surgeon: Vergia Glasgow, MD;  Location: [facility] ENDOSCOPY;  Service: Pulmonary;;    FAMILY HISTORY: Family History  Problem Relation Age of Onset    Diabetes Mother    Heart attack Mother 28   Cancer Father        liver    COPD Brother    HIV/AIDS Brother    Prostate cancer Neg Hx    Colon cancer Neg Hx     ADVANCED DIRECTIVES (Y/N):  N  HEALTH MAINTENANCE: Social History   Tobacco Use   Smoking status: Former    Current packs/day: 0.00    Average packs/day: 1.5 packs/day for 49.0 years (73.5 ttl pk-yrs)    Types: Cigarettes    Start date: 79    Quit date: 09/14/2017    Years since quitting: 6.3   Smokeless tobacco: Former  Building services engineer status: Never Used  Substance Use Topics   Alcohol use: Not Currently    Comment: 6 pack on weekend   Drug use: No     Colonoscopy:  PAP:  Bone density:  Lipid panel:  No Known Allergies  Current Outpatient Medications  Medication Sig Dispense Refill   acetaminophen  (TYLENOL ) 325 MG tablet Take 650 mg by mouth.     albuterol  (VENTOLIN  HFA) 108 (90 Base) MCG/ACT inhaler Inhale 2 puffs into the lungs every 4 (four) hours as needed for wheezing or shortness of breath. 1 each 2   aspirin  EC 325 MG EC tablet Take 1 tablet (325 mg total) by mouth daily. 90 tablet 0   b complex vitamins capsule Take 1 capsule by mouth daily.     Cholecalciferol (VITAMIN D ) 50 MCG (2000 UT) tablet Take 2,000 Units by mouth daily.     dexamethasone  (DECADRON ) 4 MG tablet Take 4 mg by mouth.     ezetimibe  (ZETIA ) 10 MG tablet Take 1 tablet (10 mg total) by mouth daily. 90 tablet 3   fluticasone  (FLONASE ) 50 MCG/ACT nasal spray Place 2 sprays into both nostrils daily. (Patient taking differently: Place 2 sprays into both nostrils daily as needed for allergies.) 48 g 3   melatonin 5 MG TABS Take 5 mg by mouth at bedtime as needed (sleep).     midodrine  (PROAMATINE ) 5 MG tablet Take 1 tablet (5 mg total) by mouth 3 (three) times daily with meals. 270 tablet 1   Multiple Vitamin (MULTIVITAMIN WITH MINERALS) TABS tablet Take 1 tablet by mouth daily.     Multiple Vitamins-Minerals (IMMUNE SUPPORT VITAMIN  C PO) Take 1 tablet by mouth daily.     naproxen  (NAPROSYN ) 500 MG tablet Take 1 tablet (500 mg total) by mouth 2 (two) times daily with a meal. 10 tablet 0   Oxycodone  HCl 10 MG TABS Take 1 tablet (10 mg total) by mouth every 6 (six) hours as needed. Take with stool softeners to avoid constipation 120 tablet 0   polyethylene glycol (MIRALAX ) 17 g packet Take 17 g by mouth 2 (two) times daily as needed for mild constipation or moderate constipation (Hold if diarrhea.). 100 each 2   rosuvastatin  (CRESTOR ) 40 MG tablet Take 1 tablet (40 mg total) by mouth every other day. 45 tablet 3   senna (SENOKOT) 8.6 MG TABS tablet Take 1-2 tablets (8.6-17.2 mg total) by mouth 2 (two) times daily as needed for mild constipation  or moderate constipation (Hold if diarrhea). 120 tablet 0   sildenafil  (REVATIO ) 20 MG tablet TAKE 1 TO 5 TABLETS BY MOUTH ABOUT 30 MINUTES PRIOR TO SEX.START WITH 1 THEN INCREASE 30 tablet 5   STIOLTO RESPIMAT  2.5-2.5 MCG/ACT AERS INHALE 2 PUFFS EVERY DAY 12 g 3   sucralfate  (CARAFATE ) 1 g tablet Take 1 tablet (1 g total) by mouth 3 (three) times daily before meals. 90 tablet 1   traMADol (ULTRAM) 50 MG tablet Take 50 mg by mouth every 6 (six) hours as needed.     No current facility-administered medications for this visit.    OBJECTIVE: Vitals:   01/20/24 0936  BP: 101/76  Pulse: 73  Resp: 16  Temp: (!) 97 F (36.1 C)  SpO2: 98%       Body mass index is 22.96 kg/m.    ECOG FS:0 - Asymptomatic  General: Well-developed, well-nourished, no acute distress. Eyes: Pink conjunctiva, anicteric sclera. HEENT: Normocephalic, moist mucous membranes. Lungs: No audible wheezing or coughing. Heart: Regular rate and rhythm. Abdomen: Soft, nontender, no obvious distention. Musculoskeletal: No edema, cyanosis, or clubbing. Neuro: Alert, answering all questions appropriately. Cranial nerves grossly intact. Skin: No rashes or petechiae noted. Psych: Normal affect.  LAB RESULTS:  Lab  Results  Component Value Date   NA 138 01/18/2024   K 4.1 01/18/2024   CL 108 01/18/2024   CO2 24 01/18/2024   GLUCOSE 119 (H) 01/18/2024   BUN 20 01/18/2024   CREATININE 0.89 01/18/2024   CALCIUM  9.8 01/18/2024   PROT 7.2 01/18/2024   ALBUMIN 3.8 01/18/2024   AST 26 01/18/2024   ALT 21 01/18/2024   ALKPHOS 76 01/18/2024   BILITOT 1.4 (H) 01/18/2024   GFRNONAA >60 01/18/2024   GFRAA 85 12/22/2020    Lab Results  Component Value Date   WBC 10.5 01/18/2024   NEUTROABS 4.6 01/11/2024   HGB 15.4 01/18/2024   HCT 44.7 01/18/2024   MCV 91.2 01/18/2024   PLT 253 01/18/2024     STUDIES: MR Brain W and Wo Contrast Result Date: 01/18/2024 CLINICAL DATA:  Metastatic disease EXAM: MRI HEAD WITHOUT AND WITH CONTRAST TECHNIQUE: Multiplanar, multiecho pulse sequences of the brain and surrounding structures were obtained without and with intravenous contrast. CONTRAST:  7mL GADAVIST  GADOBUTROL  1 MMOL/ML IV SOLN COMPARISON:  02/11/2023 FINDINGS: Brain: There is no acute infarct or acute intracranial hemorrhage. There is a mass of the left cerebellum with a large amount of surrounding vasogenic edema causing mass effect on the fourth ventricle. Contrast enhancing component of the mass measures 3.1 x 2.6 cm. Unchanged area of right frontal encephalomalacia. No other contrast-enhancing lesions. Major midline structures are normal. No hydrocephalus. Vascular: Chronic bilateral ICA occlusion Skull and upper cervical spine: Normal marrow signal. Sinuses/Orbits: Paranasal sinuses are clear. No mastoid effusion. Normal orbits. Other: None IMPRESSION: 1. A 3.1 x 2.6 cm mass of the left cerebellum with a large amount of surrounding vasogenic edema causing mass effect on the fourth ventricle. No hydrocephalus. 2. Unchanged area of right frontal encephalomalacia. 3. Chronic bilateral ICA occlusion. Electronically Signed   By: Juanetta Nordmann M.D.   On: 01/18/2024 22:45   CT CHEST ABDOMEN PELVIS W  CONTRAST Result Date: 01/18/2024 CLINICAL DATA:  Brain mass assess for metastatic disease EXAM: CT CHEST, ABDOMEN, AND PELVIS WITH CONTRAST TECHNIQUE: Multidetector CT imaging of the chest, abdomen and pelvis was performed following the standard protocol during bolus administration of intravenous contrast. RADIATION DOSE REDUCTION: This exam was performed according to  the departmental dose-optimization program which includes automated exposure control, adjustment of the mA and/or kV according to patient size and/or use of iterative reconstruction technique. CONTRAST:  OMNIPAQUE  IOHEXOL  300 MG/ML  SOLN COMPARISON:  CT chest 10/14/2023, 06/26/2023, 02/05/2023, PET CT 08/17/2023 FINDINGS: CT CHEST FINDINGS Cardiovascular: Mild aortic atherosclerosis. No aneurysm. Normal cardiac size. Trace pericardial effusion. Left-sided central venous port with tip at the cavoatrial junction Mediastinum/Nodes: Patent trachea. No thyroid  mass. No increasing lymph nodes. Esophagus shows small hiatal hernia Lungs/Pleura: Advanced emphysema. Scarring in the right upper lobe. Right upper lobe lung mass measures 4.5 x 3.6 cm on series 2, image 23, previously 5.1 x 4.2 cm. Extension to superior segment of right lower lobe. Mass abuts the right posterior pleural surface. Mild surrounding distortion and mild ground-glass disease as before. No additional suspicious lung nodules. Musculoskeletal: Sternum appears intact. No acute or suspicious osseous abnormality. CT ABDOMEN PELVIS FINDINGS Hepatobiliary: Gallstones. No focal hepatic abnormality or biliary dilatation Pancreas: Unremarkable. No pancreatic ductal dilatation or surrounding inflammatory changes. Spleen: Normal in size without focal abnormality. Adrenals/Urinary Tract: Adrenal glands are normal. Kidneys show no hydronephrosis. The bladder is unremarkable Stomach/Bowel: The stomach is nonenlarged. There is no dilated small bowel. No acute bowel wall thickening  Vascular/Lymphatic: Aortic atherosclerosis. No enlarged abdominal or pelvic lymph nodes. Reproductive: Prostate is unremarkable. Other: Negative for pelvic effusion or free air. Small fat containing left inguinal hernia Musculoskeletal: No acute or suspicious osseous abnormality. IMPRESSION: 1. Slight decrease in size of right upper lobe lung mass compared with CT from February. Stable scarring and distortion in the right upper lobe. 2. No CT evidence for metastatic disease to the abdomen or pelvis. 3. Gallstones. 4. Emphysema. Aortic Atherosclerosis (ICD10-I70.0) and Emphysema (ICD10-J43.9). Electronically Signed   By: Esmeralda Hedge M.D.   On: 01/18/2024 21:06   CT Head Wo Contrast Result Date: 01/18/2024 CLINICAL DATA:  Headache with vomiting EXAM: CT HEAD WITHOUT CONTRAST TECHNIQUE: Contiguous axial images were obtained from the base of the skull through the vertex without intravenous contrast. RADIATION DOSE REDUCTION: This exam was performed according to the departmental dose-optimization program which includes automated exposure control, adjustment of the mA and/or kV according to patient size and/or use of iterative reconstruction technique. COMPARISON:  CT brain 02/05/2023, MRI 02/11/2023 FINDINGS: Brain: No acute territorial infarct or hemorrhage is visualized. Interim development lobulated left cerebellar mass measuring 3.1 x 2.3 cm on series 2, image 6, with significant surrounding low-density edema and local mass effect. Mass effect on fourth ventricle. Interval enlargement of lateral and third ventricles compared to prior head CT from June. Chronic right MCA infarct with ex vacuo dilatation of right lateral ventricle. Mild atrophy Vascular: No hyperdense vessels.  Carotid vascular calcification Skull: Normal. Negative for fracture or focal lesion. Sinuses/Orbits: No acute finding. Other: None IMPRESSION: 1. Interim development of lobulated left cerebellar mass measuring 3.1 x 2.3 cm with significant  surrounding low-density edema and local mass effect. Mass effect on fourth ventricle. Interval enlargement of lateral and third ventricles compared to prior head CT from June. Findings are suspicious for metastatic disease. Recommend MRI brain with and without contrast for further assessment. 2. Chronic right MCA infarct. Critical Value/emergent results were called by telephone at the time of interpretation on 01/18/2024 at 6:19 pm to provider Bryson Carbine , who verbally acknowledged these results. Electronically Signed   By: Esmeralda Hedge M.D.   On: 01/18/2024 18:19     ASSESSMENT: Stage IIIa adenocarcinoma of the right upper lobe lung.  PLAN:    Stage IIIa adenocarcinoma of the right upper lobe lung: Pathology and imaging reviewed independently confirming diagnosis and stage of disease.  PET scan results from February 14, 2023 reviewed independently with no obvious evidence of metastatic disease.  MRI of the brain from February 11, 2023 also negative for metastatic disease.  Surgical resection was not an option, therefore patient proceeded with concurrent XRT along with weekly carboplatin  and Taxol  which was completed on May 27, 2023.  Patient initiated his year-long maintenance durvalumab  on June 08, 2023.  Patient's most recent imaging with CT scan on Jan 18, 2024 did not reveal any evidence of recurrent or progressive disease.  MRI of the brain revealed isolated brain metastasis.  Patient most recently received cycle 16 of durvalumab  on Jan 11, 2024.  He previously expressed interest in discontinuing treatment secondary to financial concerns.  Treatment is currently on hold until he completes surgery and XRT to his brain metastasis.   Brain metastasis: MRI results from Jan 18, 2024 reviewed independently and reported as above revealing a 3.1 x 2.5 cm left cerebellar brain mass consistent with metastatic disease.  No other disease was noted.  Patient was transferred to [facility] and surgery was  recommended,.  Have instructed patient to schedule surgery at [facility].  He will also likely require adjuvant XRT to his brain which he has expressed interest in completing here at [facility].  Further follow-up will be based on surgery and radiation appointments.   Chest pain: Patient does not complain of this today.  Continue oxycodone  as needed. Iron  deficiency anemia: Resolved.  He last received IV Venofer  on May 25, 2023. Dizziness/nausea/headache: Secondary to edema from brain metastasis.  Resolved with steroids.  Patient has been instructed to continue his steroids as prescribed.   History of CVA: Patient reports he is on aspirin  325 mg daily.  This will need to be discontinued in preparation for his surgery.  I spent a total of 30 minutes reviewing chart data, face-to-face evaluation with the patient, counseling and coordination of care as detailed above.   Patient expressed understanding and was in agreement with this plan. He also understands that He can call clinic at any time with any questions, concerns, or complaints.    Cancer Staging  Adenocarcinoma of upper lobe of right lung [facility]) Staging form: Lung, AJCC 8th Edition - Clinical stage from 03/15/2023: Stage IIIA (cT4, cN0, cM0) - Signed by Shellie Dials, MD on 03/15/2023 Stage prefix: Initial diagnosis   Shellie Dials, MD   01/20/2024 2:50 PM

## 2024-01-20 NOTE — Patient Instructions (Signed)
 Referral faxed to Dr. Berta Brittle at [facility] Neurosurgery.

## 2024-01-24 ENCOUNTER — Other Ambulatory Visit: Payer: Self-pay | Admitting: Family Medicine

## 2024-01-24 DIAGNOSIS — I951 Orthostatic hypotension: Secondary | ICD-10-CM

## 2024-01-25 ENCOUNTER — Ambulatory Visit

## 2024-01-31 ENCOUNTER — Telehealth: Payer: Self-pay

## 2024-01-31 NOTE — Transitions of Care (Post Inpatient/ED Visit) (Signed)
   01/31/2024  Name: MARKOS THEIL MRN: [id number] DOB: [birth year]  Today's TOC FU Call Status: Today's TOC FU Call Status:: Unsuccessful Call (1st Attempt) Unsuccessful Call (1st Attempt) Date: 01/31/24  Attempted to reach the patient regarding the most recent Inpatient/ED visit. Left a HIPAA approved voicemail message requesting a return call  Follow Up Plan: Additional outreach attempts will be made to reach the patient to complete the Transitions of Care (Post Inpatient/ED visit) call.   Brown [place], RN, BSN, CCM [facility], [facility] RN Care Manager Direct Dial: [phone]

## 2024-02-01 ENCOUNTER — Telehealth: Payer: Self-pay

## 2024-02-01 NOTE — Patient Instructions (Signed)
 Visit Information  Thank you for taking time to visit with me today. Please don't hesitate to contact me if I can be of assistance to you before our next scheduled telephone appointment.  Our next appointment is by telephone on 02/10/24 at 1:00 PM  Following is a copy of your care plan:   Goals Addressed             This Visit's Progress    VBCI Transitions of Care (TOC) Care Plan       Problems:  Recent Hospitalization for treatment of s/p Craniotomy for left inferior cerebellar mass  No Hospital Follow Up Provider appointment Patient/wife to make follow up appointment with PCP, has follow up appointment with specialist next week 02/08/24  Goal:  Over the next 30 days, the patient will not experience hospital readmission  Interventions:  Transitions of Care: Doctor Visits  - discussed the importance of doctor visits Post discharge activity limitations prescribed by provider reviewed Post-op wound/incision care reviewed with patient/caregiver Reviewed Signs and symptoms of infection  Patient Self Care Activities:  Attend all scheduled provider appointments Call pharmacy for medication refills 3-7 days in advance of running out of medications Call provider office for new concerns or questions  Participate in Transition of Care Program/Attend TOC scheduled calls Take medications as prescribed    Plan:  Telephone follow up appointment with care management team member scheduled for:  02/10/2024 at 1:00 PM The care management team will reach out to the patient again over the next 7- 10 days days. The patient will call PCP or Specialist as advised to changes in surgical site, headaches, s/s of infection.  Patient and wife to make follow up with PCP        Patient verbalizes understanding of instructions and care plan provided today and agrees to view in MyChart. Active MyChart status and patient understanding of how to access instructions and care plan via MyChart confirmed with  patient.     The patient has been provided with contact information for the care management team and has been advised to call with any health related questions or concerns.  The care management team will reach out to the patient again over the next 7 - 10  days.   Please call the care guide team at [phone] if you need to cancel or reschedule your appointment.   Please call the [place]  National Suicide Prevention Lifeline: 2676013166 or TTY: [phone] TTY [phone]) to talk to a trained counselor if you are experiencing a Mental Health or Behavioral Health Crisis or need someone to talk to.  Brown [place], RN, BSN, CCM [facility], [facility] RN Care Manager Direct Dial: [phone]

## 2024-02-01 NOTE — Transitions of Care (Post Inpatient/ED Visit) (Signed)
 02/01/2024  Name: Ethan Fitzgerald MRN: [id number] DOB: 26-Jul-1956  Today's TOC FU Call Status: Today's TOC FU Call Status:: Successful TOC FU Call Completed TOC FU Call Complete Date: 02/01/24 Patient's Name and Date of Birth confirmed.  Transition Care Management Follow-up Telephone Call Date of Discharge: 01/27/24 Discharge Facility: Other ([facility]) Name of Other ([facility]) Discharge Facility: [facility] Type of Discharge: Inpatient Admission Primary Inpatient Discharge Diagnosis:: Brain Tumor surgery How have you been since you were released from the hospital?: Better Any questions or concerns?: No  Items Reviewed: Did you receive and understand the discharge instructions provided?: Yes Medications obtained,verified, and reconciled?: No (Patient states since brain surgery difficulty seeing and he'd have to get his bottles and can't see them very well) Do you have support at home?: Yes People in Home [RPT]: spouse, child(ren), dependent Name of Support/Comfort Primary Source: Wife Libby Ree, supportive and adult children supportive  Medications Reviewed Today: Patient deferred review due to eyesight changes since surgery and difficulty reading bottles. States he is taking his medications as prescribed and has pain medications as needed. Medications Reviewed Today   Medications were not reviewed in this encounter     Home Care and Equipment/Supplies: Were Home Health Services Ordered?: No Any new equipment or medical supplies ordered?:  (has his father's walker for support)  Functional Questionnaire: Do you need assistance with bathing/showering or dressing?: Yes Do you need assistance with eating?: No  Follow up appointments reviewed: PCP Follow-up appointment confirmed?: Yes Date of PCP follow-up appointment?:  (Offered by patient wanted to wait for wife to call) Specialist Hospital Follow-up appointment confirmed?: Yes Date of Specialist follow-up  appointment?: 02/08/24 Follow-Up Specialty Provider:: Shellie Dials, MD in [facility] MED ONC  SDOH Interventions Today    Flowsheet Row Most Recent Value  SDOH Interventions   Food Insecurity Interventions Intervention Not Indicated  Transportation Interventions Intervention Not Indicated       Goals Addressed             This Visit's Progress    VBCI Transitions of Care (TOC) Care Plan       Problems:  Recent Hospitalization for treatment of s/p Craniotomy for left inferior cerebellar mass  No Hospital Follow Up Provider appointment Patient/wife to make follow up appointment with PCP, has follow up appointment with specialist next week 02/08/24  Goal:  Over the next 30 days, the patient will not experience hospital readmission  Interventions:  Transitions of Care: Doctor Visits  - discussed the importance of doctor visits Post discharge activity limitations prescribed by provider reviewed Post-op wound/incision care reviewed with patient/caregiver Reviewed Signs and symptoms of infection  Patient Self Care Activities:  Attend all scheduled provider appointments Call pharmacy for medication refills 3-7 days in advance of running out of medications Call provider office for new concerns or questions  Participate in Transition of Care Program/Attend [facility] scheduled calls Take medications as prescribed    Plan:  Telephone follow up appointment with care management team member scheduled for:  02/10/2024 at 1:00 PM The care management team will reach out to the patient again over the next 7- 10 days days. The patient will call PCP or Specialist as advised to changes in surgical site, headaches, s/s of infection.  Patient and wife to make follow up with PCP        [place], RN, BSN, CCM [place]  [facility], [facility] RN Care Manager Direct Dial: [phone]

## 2024-02-07 ENCOUNTER — Ambulatory Visit (INDEPENDENT_AMBULATORY_CARE_PROVIDER_SITE_OTHER)

## 2024-02-07 VITALS — BP 101/76 | Ht 70.0 in | Wt 127.0 lb

## 2024-02-07 DIAGNOSIS — Z0000 Encounter for general adult medical examination without abnormal findings: Secondary | ICD-10-CM | POA: Diagnosis not present

## 2024-02-07 DIAGNOSIS — Z2821 Immunization not carried out because of patient refusal: Secondary | ICD-10-CM | POA: Diagnosis not present

## 2024-02-07 DIAGNOSIS — Z5320 Procedure and treatment not carried out because of patient's decision for unspecified reasons: Secondary | ICD-10-CM | POA: Diagnosis not present

## 2024-02-07 NOTE — Patient Instructions (Addendum)
 Ethan Fitzgerald , Thank you for taking time out of your busy schedule to complete your Annual Wellness Visit with me. I enjoyed our conversation and look forward to speaking with you again next year. I, as well as your care team,  appreciate your ongoing commitment to your health goals. Please review the following plan we discussed and let me know if I can assist you in the future. Your Game plan/ To Do List    Referrals: If you haven't heard from the office you've been referred to, please reach out to them at the phone provided.  None  Follow up Visits: Next Medicare AWV with our clinical staff: 02/07/2025   Have you seen your provider in the last 6 months (3 months if uncontrolled diabetes)? Yes Next Office Visit with your provider: 06/29/2024  Clinician Recommendations:  Aim for 30 minutes of exercise or brisk walking, 6-8 glasses of water, and 5 servings of fruits and vegetables each day.       This is a list of the screening recommended for you and due dates:  Health Maintenance  Topic Date Due   Zoster (Shingles) Vaccine (1 of 2) Never done   Pneumonia Vaccine (2 of 2 - PPSV23) 05/29/2019   COVID-19 Vaccine (3 - Pfizer risk series) 02/16/2020   Colon Cancer Screening  04/25/2020   Flu Shot  04/06/2024   Medicare Annual Wellness Visit  02/06/2025   DTaP/Tdap/Td vaccine (2 - Td or Tdap) 03/19/2027   Hepatitis C Screening  Completed   HPV Vaccine  Aged Out   Meningitis B Vaccine  Aged Out   Screening for Lung Cancer  Discontinued    Advanced directives: (Declined) Advance directive discussed with you today. Even though you declined this today, please call our office should you change your mind, and we can give you the proper paperwork for you to fill out. Advance Care Planning is important because it:  [x]  Makes sure you receive the medical care that is consistent with your values, goals, and preferences  [x]  It provides guidance to your family and loved ones and reduces their  decisional burden about whether or not they are making the right decisions based on your wishes.  Follow the link provided in your after visit summary or read over the paperwork we have mailed to you to help you started getting your Advance Directives in place. If you need assistance in completing these, please reach out to us  so that we can help you!  See attachments for Preventive Care and Fall Prevention Tips.

## 2024-02-07 NOTE — Progress Notes (Signed)
 Because this visit was a virtual/telehealth visit,  certain criteria was not obtained, such a blood pressure, CBG if applicable, and timed get up and go. Any medications not marked as "taking" were not mentioned during the medication reconciliation part of the visit. Any vitals not documented were not able to be obtained due to this being a telehealth visit or patient was unable to self-report a recent blood pressure reading due to a lack of equipment at home via telehealth. Vitals that have been documented are verbally provided by the patient.   This visit was performed by a medical professional under my direct supervision. I was immediately available for consultation/collaboration. I have reviewed and agree with the Annual Wellness Visit documentation.  Subjective:   Ethan Fitzgerald is a 68 y.o. who presents for a Medicare Wellness preventive visit.  As a reminder, Annual Wellness Visits don't include a physical exam, and some assessments may be limited, especially if this visit is performed virtually. We may recommend an in-person follow-up visit with your provider if needed.  Visit Complete: Virtual I connected with  Ethan Fitzgerald on 02/07/24 by a audio enabled telemedicine application and verified that I am speaking with the correct person using two identifiers.  Patient Location: Home  Provider Location: Home Office  I discussed the limitations of evaluation and management by telemedicine. The patient expressed understanding and agreed to proceed.  Vital Signs: Because this visit was a virtual/telehealth visit, some criteria may be missing or patient reported. Any vitals not documented were not able to be obtained and vitals that have been documented are patient reported.  VideoDeclined- This patient declined Librarian, academic. Therefore the visit was completed with audio only.  Persons Participating in Visit: Patient.  AWV Questionnaire: No: Patient  Medicare AWV questionnaire was not completed prior to this visit.  Cardiac Risk Factors include: advanced age (>17men, >46 women);male gender;hypertension;Other (see comment), Risk factor comments: copd     Objective:     Today's Vitals   02/07/24 1312 02/07/24 1314  BP: 101/76   Weight: 127 lb (57.6 kg)   Height: 5\' 10"  (1.778 m)   PainSc:  4    Body mass index is 18.22 kg/m.     02/07/2024    1:17 PM 01/18/2024    5:14 PM 01/11/2024    8:21 AM 12/28/2023    8:46 AM 12/15/2023    8:30 AM 11/16/2023    8:46 AM 11/02/2023    8:19 AM  Advanced Directives  Does Patient Have a Medical Advance Directive? Yes Yes No Yes Yes Yes Yes  Type of Estate agent of [place];Living will Healthcare Power of [place];Living will  Healthcare Power of [place];Living will  Healthcare Power of [place];Living will Living will;Healthcare Power of Attorney  Does patient want to make changes to medical advance directive? No - Patient declined   No - Patient declined   No - Patient declined  Copy of Healthcare Power of Attorney in Chart? No - copy requested   No - copy requested   No - copy requested  Would patient like information on creating a medical advance directive?    No - Patient declined   No - Patient declined    Current Medications (verified) Outpatient Encounter Medications as of 02/07/2024  Medication Sig   acetaminophen  (TYLENOL ) 325 MG tablet Take 650 mg by mouth.   albuterol  (VENTOLIN  HFA) 108 (90 Base) MCG/ACT inhaler Inhale 2 puffs into the lungs every 4 (four) hours  as needed for wheezing or shortness of breath.   aspirin  EC 325 MG EC tablet Take 1 tablet (325 mg total) by mouth daily.   b complex vitamins capsule Take 1 capsule by mouth daily.   Cholecalciferol (VITAMIN D ) 50 MCG (2000 UT) tablet Take 2,000 Units by mouth daily.   dexamethasone  (DECADRON ) 4 MG tablet Take 4 mg by mouth.   ezetimibe  (ZETIA ) 10 MG tablet Take 1 tablet (10 mg total) by mouth daily.    fluticasone  (FLONASE ) 50 MCG/ACT nasal spray Place 2 sprays into both nostrils daily. (Patient taking differently: Place 2 sprays into both nostrils daily as needed for allergies.)   melatonin 5 MG TABS Take 5 mg by mouth at bedtime as needed (sleep).   midodrine  (PROAMATINE ) 5 MG tablet TAKE 1 TABLET BY MOUTH 3 TIMES DAILY WITH MEALS.   Multiple Vitamin (MULTIVITAMIN WITH MINERALS) TABS tablet Take 1 tablet by mouth daily.   Multiple Vitamins-Minerals (IMMUNE SUPPORT VITAMIN C PO) Take 1 tablet by mouth daily.   naproxen  (NAPROSYN ) 500 MG tablet Take 1 tablet (500 mg total) by mouth 2 (two) times daily with a meal.   Oxycodone  HCl 10 MG TABS Take 1 tablet (10 mg total) by mouth every 6 (six) hours as needed. Take with stool softeners to avoid constipation   polyethylene glycol (MIRALAX ) 17 g packet Take 17 g by mouth 2 (two) times daily as needed for mild constipation or moderate constipation (Hold if diarrhea.).   rosuvastatin  (CRESTOR ) 40 MG tablet Take 1 tablet (40 mg total) by mouth every other day.   senna (SENOKOT) 8.6 MG TABS tablet Take 1-2 tablets (8.6-17.2 mg total) by mouth 2 (two) times daily as needed for mild constipation or moderate constipation (Hold if diarrhea).   sildenafil  (REVATIO ) 20 MG tablet TAKE 1 TO 5 TABLETS BY MOUTH ABOUT 30 MINUTES PRIOR TO SEX.START WITH 1 THEN INCREASE   STIOLTO RESPIMAT  2.5-2.5 MCG/ACT AERS INHALE 2 PUFFS EVERY DAY   sucralfate  (CARAFATE ) 1 g tablet Take 1 tablet (1 g total) by mouth 3 (three) times daily before meals.   traMADol (ULTRAM) 50 MG tablet Take 50 mg by mouth every 6 (six) hours as needed.   No facility-administered encounter medications on file as of 02/07/2024.    Allergies (verified) Patient has no known allergies.   History: Past Medical History:  Diagnosis Date   COPD (chronic obstructive pulmonary disease) (HCC)    mild   COVID-19 09/2019   Depression    Erectile dysfunction    Headache    daily, stress headaches   Knee  pain, left    Seasonal allergies    Stroke [facility])    Tobacco dependence    Past Surgical History:  Procedure Laterality Date   BACK SURGERY     metal plate in back   COLONOSCOPY     COLONOSCOPY WITH PROPOFOL  N/A 04/26/2019   Procedure: COLONOSCOPY WITH PROPOFOL ;  Surgeon: Luke Salaam, MD;  Location[facility] ENDOSCOPY;  Service: Gastroenterology;  Laterality: N/A;   FINE NEEDLE ASPIRATION  03/03/2023   Procedure: FINE NEEDLE ASPIRATION;  Surgeon: Vergia Glasgow, MD;  Location: [facility] ENDOSCOPY;  Service: Pulmonary;;   HERNIA REPAIR Right    IR ANGIO INTRA EXTRACRAN SEL COM CAROTID INNOMINATE UNI L MOD SED  03/27/2020   IR ANGIO VERTEBRAL SEL SUBCLAVIAN INNOMINATE UNI L MOD SED  03/27/2020   IR ANGIO VERTEBRAL SEL VERTEBRAL UNI R MOD SED  03/27/2020   IR CT HEAD LTD  03/27/2020  IR IMAGING GUIDED PORT INSERTION  03/29/2023   IR PERCUTANEOUS ART THROMBECTOMY/INFUSION INTRACRANIAL INC DIAG ANGIO  03/27/2020   KNEE ARTHROSCOPY Left 03/28/2015   Procedure: Arthroscopic partial medial meniscectomy plus chondral debridement;  Surgeon: Josephus Nida, MD;  Location: [facility];  Service: Orthopedics;  Laterality: Left;   RADIOLOGY WITH ANESTHESIA N/A 03/27/2020   Procedure: IR WITH ANESTHESIA;  Surgeon: Luellen Sages, MD;  Location: [facility] OR;  Service: Radiology;  Laterality: N/A;   VIDEO BRONCHOSCOPY WITH ENDOBRONCHIAL ULTRASOUND  03/03/2023   Procedure: VIDEO BRONCHOSCOPY WITH ENDOBRONCHIAL ULTRASOUND;  Surgeon: Vergia Glasgow, MD;  Location: [facility] ENDOSCOPY;  Service: Pulmonary;;   Family History  Problem Relation Age of Onset   Diabetes Mother    Heart attack Mother 12   Cancer Father        liver    COPD Brother    HIV/AIDS Brother    Prostate cancer Neg Hx    Colon cancer Neg Hx    Social History   Socioeconomic History   Marital status: Married    Spouse name: Libby Ree   Number of children: 0   Years of education: Not on file   Highest education level: Some college, no  degree  Occupational History   Not on file  Tobacco Use   Smoking status: Former    Current packs/day: 0.00    Average packs/day: 1.5 packs/day for 49.0 years (73.5 ttl pk-yrs)    Types: Cigarettes    Start date: 63    Quit date: 09/14/2017    Years since quitting: 6.4   Smokeless tobacco: Former  Building services engineer status: Never Used  Substance and Sexual Activity   Alcohol use: Not Currently    Comment: 6 pack on weekend   Drug use: No   Sexual activity: Not on file  Other Topics Concern   Not on file  Social History Narrative   Lives at home w wife   L handed   Caffeine: 1 C of coffee AM   Social Drivers of Corporate investment banker Strain: Low Risk  (02/07/2024)   Overall Financial Resource Strain (CARDIA)    Difficulty of Paying Living Expenses: Not hard at all  Food Insecurity: No Food Insecurity (02/07/2024)   Hunger Vital Sign    Worried About Running Out of Food in the Last Year: Never true    Ran Out of Food in the Last Year: Never true  Transportation Needs: No Transportation Needs (02/07/2024)   PRAPARE - Administrator, Civil Service (Medical): No    Lack of Transportation (Non-Medical): No  Physical Activity: Insufficiently Active (02/07/2024)   Exercise Vital Sign    Days of Exercise per Week: 3 days    Minutes of Exercise per Session: 20 min  Stress: No Stress Concern Present (02/07/2024)   [employer] of Occupational Health - Occupational Stress Questionnaire    Feeling of Stress : Not at all  Social Connections: Socially Isolated (02/07/2024)   Social Connection and Isolation Panel [NHANES]    Frequency of Communication with Friends and Family: Once a week    Frequency of Social Gatherings with Friends and Family: Never    Attends Religious Services: Never    Database administrator or Organizations: No    Attends Banker Meetings: Never    Marital Status: Married    Tobacco Counseling Counseling given: Not  Answered    Clinical Intake:  Pre-visit preparation completed: Yes  Pain :  0-10 Pain Score: 4  Pain Type: Chronic pain Pain Location: Head     Nutritional Status: BMI <19  Underweight Nutritional Risks: None Diabetes: No  Lab Results  Component Value Date   HGBA1C 6.0 01/02/2024   HGBA1C 6.1 (H) 12/22/2022   HGBA1C 5.7 (H) 12/23/2021     How often do you need to have someone help you when you read instructions, pamphlets, or other written materials from your doctor or pharmacy?: 1 - Never What is the last grade level you completed in school?: high school  Interpreter Needed?: No  Information entered by :: [employer]   Activities of Daily Living     02/07/2024    1:16 PM 12/29/2023   10:11 AM  In your present state of health, do you have any difficulty performing the following activities:  Hearing? 0 0  Vision? 0 1  Comment  pt had reading glasses. states one eye is blurry.  Difficulty concentrating or making decisions? 0 0  Walking or climbing stairs? 0 1  Dressing or bathing? 0 0  Doing errands, shopping? 0 0  Preparing Food and eating ? N   Using the Toilet? N   In the past six months, have you accidently leaked urine? N   Do you have problems with loss of bowel control? N   Managing your Medications? N   Managing your Finances? N   Housekeeping or managing your Housekeeping? N     Patient Care Team: Dorothe Gaster, NP as PCP - General (Nurse Practitioner) Lisabeth Rider, MD as Referring Physician (Neurology) Drake Gens, RN as Oncology Nurse Navigator Adrian Alba, Deadra Everts, MD as Consulting Physician (Oncology) Jamie Mccoy, RN as [facility] Care Management  I have updated your Care Teams any recent Medical Services you may have received from other providers in the past year.     Assessment:    This is a routine wellness examination for Ethan Fitzgerald.  Hearing/Vision screen Hearing Screening - Comments:: Patient declined hearing difficulties   Vision Screening - Comments:: Wear readers    Goals Addressed             This Visit's Progress    DIET - INCREASE WATER INTAKE   On track      Depression Screen     02/07/2024    1:17 PM 02/01/2024    3:07 PM 12/29/2023   10:16 AM 12/29/2022   10:39 AM 11/05/2022    8:21 AM 10/19/2022   11:22 AM 05/18/2022    8:18 AM  PHQ 2/9 Scores  PHQ - 2 Score 0 0 0 0 0 0 0  PHQ- 9 Score 0  1  0 2 0    Fall Risk     02/07/2024    1:17 PM 02/01/2024    3:05 PM 12/29/2023   10:11 AM 12/29/2022   10:39 AM 11/05/2022    8:24 AM  Fall Risk   Falls in the past year? 0 0 1 0 0  Number falls in past yr: 0 0 0  0  Injury with Fall? 0  0  0  Risk for fall due to : History of fall(s)  No Fall Risks  No Fall Risks  Follow up Falls evaluation completed  Falls evaluation completed  Falls prevention discussed;Falls evaluation completed    MEDICARE RISK AT HOME:  Medicare Risk at Home Any stairs in or around the home?: No If so, are there any without handrails?: No Home free of loose throw  rugs in walkways, pet beds, electrical cords, etc?: Yes Adequate lighting in your home to reduce risk of falls?: Yes Life alert?: No Use of a cane, walker or w/c?: Yes (walker) Grab bars in the bathroom?: Yes Shower chair or bench in shower?: Yes Elevated toilet seat or a handicapped toilet?: Yes  TIMED UP AND GO:  Was the test performed?  No  Cognitive Function: 6CIT completed        02/07/2024    1:13 PM 11/05/2022    8:28 AM 10/07/2020    3:25 PM  6CIT Screen  What Year? 0 points 0 points 0 points  What month? 0 points 0 points 0 points  What time? 0 points 0 points 0 points  Count back from 20 0 points 0 points 0 points  Months in reverse 0 points 0 points 0 points  Repeat phrase 0 points 0 points 2 points  Total Score 0 points 0 points 2 points    Immunizations Immunization History  Administered Date(s) Administered   Influenza,inj,Quad PF,6+ Mos 06/22/2018, 06/30/2019   PFIZER(Purple  Top)SARS-COV-2 Vaccination 12/29/2019, 01/19/2020   Pneumococcal Conjugate-13 04/03/2019   Tdap 03/18/2017    Screening Tests Health Maintenance  Topic Date Due   Zoster Vaccines- Shingrix (1 of 2) Never done   Pneumonia Vaccine 58+ Years old (2 of 2 - PPSV23) 05/29/2019   COVID-19 Vaccine (3 - Pfizer risk series) 02/16/2020   Colonoscopy  04/25/2020   INFLUENZA VACCINE  04/06/2024   Medicare Annual Wellness (AWV)  02/06/2025   DTaP/Tdap/Td (2 - Td or Tdap) 03/19/2027   Hepatitis C Screening  Completed   HPV VACCINES  Aged Out   Meningococcal B Vaccine  Aged Out   Lung Cancer Screening  Discontinued    Health Maintenance  Health Maintenance Due  Topic Date Due   Zoster Vaccines- Shingrix (1 of 2) Never done   Pneumonia Vaccine 35+ Years old (2 of 2 - PPSV23) 05/29/2019   COVID-19 Vaccine (3 - Pfizer risk series) 02/16/2020   Colonoscopy  04/25/2020   Health Maintenance Items Addressed:  Additional Screening:  Vision Screening: Recommended annual ophthalmology exams for early detection of glaucoma and other disorders of the eye. Would you like a referral to an eye doctor? No    Dental Screening: Recommended annual dental exams for proper oral hygiene  Community Resource Referral / Chronic Care Management: CRR required this visit?  No   CCM required this visit?  No   Plan:    I have personally reviewed and noted the following in the patient's chart:   Medical and social history Use of alcohol, tobacco or illicit drugs  Current medications and supplements including opioid prescriptions. Patient is not currently taking opioid prescriptions. Functional ability and status Nutritional status Physical activity Advanced directives List of other physicians Hospitalizations, surgeries, and ER visits in previous 12 months Vitals Screenings to include cognitive, depression, and falls Referrals and appointments  In addition, I have reviewed and discussed with patient  certain preventive protocols, quality metrics, and best practice recommendations. A written personalized care plan for preventive services as well as general preventive health recommendations were provided to patient.   Freeda Jerry, [place]   02/07/2024   After Visit Summary: (MyChart) Due to this being a telephonic visit, the after visit summary with patients personalized plan was offered to patient via MyChart   Notes: Nothing significant to report at this time.

## 2024-02-08 ENCOUNTER — Other Ambulatory Visit

## 2024-02-08 ENCOUNTER — Ambulatory Visit

## 2024-02-08 ENCOUNTER — Ambulatory Visit: Admitting: Oncology

## 2024-02-08 ENCOUNTER — Other Ambulatory Visit: Payer: Self-pay | Admitting: *Deleted

## 2024-02-08 ENCOUNTER — Inpatient Hospital Stay: Attending: Oncology | Admitting: Oncology

## 2024-02-08 ENCOUNTER — Other Ambulatory Visit: Payer: Self-pay

## 2024-02-08 DIAGNOSIS — G9389 Other specified disorders of brain: Secondary | ICD-10-CM

## 2024-02-10 ENCOUNTER — Encounter: Payer: Self-pay | Admitting: Oncology

## 2024-02-10 ENCOUNTER — Other Ambulatory Visit: Payer: Self-pay

## 2024-02-10 NOTE — Transitions of Care (Post Inpatient/ED Visit) (Signed)
 Transition of Care week 2  Visit Note  02/10/2024  Name: Ethan Fitzgerald MRN: [id number]          DOB: [birth year]  Situation: Patient enrolled in [facility] 30-day program. Visit completed with patient by telephone.   Background:   Initial Transition Care Management Follow-up Telephone Call Discharge Facility: Other Recruitment consultant Facility) Name of Other (Non[facility]) Discharge Facility: [facility]  Past Medical History:  Diagnosis Date   COPD (chronic obstructive pulmonary disease) (HCC)    mild   COVID-19 09/2019   Depression    Erectile dysfunction    Headache    daily, stress headaches   Knee pain, left    Seasonal allergies    Stroke (HCC)    Tobacco dependence     Assessment: Patient Reported Symptoms: Cognitive Cognitive Status: Alert and oriented to person, place, and time      Neurological Neurological Review of Symptoms: No symptoms reported Neurological Management Strategies: Activity Neurological Self-Management Outcome: 3 (uncertain) Neurological Comment: I need to motivate myself  HEENT HEENT Symptoms Reported: No symptoms reported      Cardiovascular Cardiovascular Symptoms Reported: No symptoms reported Does patient have uncontrolled Hypertension?: No    Respiratory Respiratory Symptoms Reported: No symptoms reported Respiratory Conditions: COPD  Endocrine Patient reports the following symptoms related to hypoglycemia or hyperglycemia : No symptoms reported    Gastrointestinal Gastrointestinal Symptoms Reported: No symptoms reported      Genitourinary Genitourinary Symptoms Reported: No symptoms reported    Integumentary Integumentary Symptoms Reported: Incision    Musculoskeletal Musculoskelatal Symptoms Reviewed: Weakness Additional Musculoskeletal Details: due to surgery Musculoskeletal Management Strategies: Medical device Musculoskeletal Self-Management Outcome: 4 (good)      Psychosocial Psychosocial Symptoms Reported: No symptoms  reported         Vitals:   02/10/24 1320  BP: 120/80    Medications Reviewed Today     Reviewed by Eilleen Richerd GRADE, RN (Registered Nurse) on 02/10/24 at 1520  Med List Status: <None>   Medication Order Taking? Sig Documenting Provider Last Dose Status Informant  acetaminophen  (TYLENOL ) 325 MG tablet [id number]  Take 650 mg by mouth. [provider]  Active   albuterol  (VENTOLIN  HFA) 108 (90 Base) MCG/ACT inhaler [id number]  Inhale 2 puffs into the lungs every 4 (four) hours as needed for wheezing or shortness of breath. Edman Marsa PARAS, DO  Active   aspirin  EC 325 MG EC tablet [id number]  Take 1 tablet (325 mg total) by mouth daily. Maurice Sharlet GORMAN, PA-C  Active Spouse/Significant Other           Med Note JEANINE ROSELINE ONEIDA Stevan Mar 23, 2023  2:19 PM)    b complex vitamins capsule [id number]  Take 1 capsule by mouth daily. [provider]  Active Spouse/Significant Other  Cholecalciferol (VITAMIN D ) 50 MCG (2000 UT) tablet [id number]  Take 2,000 Units by mouth daily. [provider]  Active Spouse/Significant Other  dexamethasone  (DECADRON ) 4 MG tablet [id number]  Take 4 mg by mouth. [provider]  Active   ezetimibe  (ZETIA ) 10 MG tablet [id number] No Take 1 tablet (10 mg total) by mouth daily.  Patient not taking: Reported on 02/10/2024   [name], NP Not Taking Active   fluticasone  (FLONASE ) 50 MCG/ACT nasal spray [id number]  Place 2 sprays into both nostrils daily.  Patient taking differently: Place 2 sprays into both nostrils daily as needed for allergies.   Edman Marsa PARAS, DO  Active Spouse/Significant Other  melatonin 5 MG TABS [id number]  Take 5 mg by mouth at bedtime as needed (sleep). [provider]  Active Spouse/Significant Other           Med Note LESLY, RICHERD GRADE   Fri Feb 10, 2024  1:07 PM) Needs to follow up with Lynwood Crandall, NP  midodrine  (PROAMATINE ) 5 MG tablet [id number]  TAKE 1 TABLET BY MOUTH 3  TIMES DAILY WITH MEALS. Crandall Lynwood HERO, NP  Active   Multiple Vitamin (MULTIVITAMIN WITH MINERALS) TABS tablet [id number]  Take 1 tablet by mouth daily. [provider]  Active Spouse/Significant Other  Multiple Vitamins-Minerals (IMMUNE SUPPORT VITAMIN C PO) [id number]  Take 1 tablet by mouth daily. [provider]  Active Spouse/Significant Other           Med Note LESLY, RICHERD GRADE   Fri Feb 10, 2024  1:37 PM) Patient states taking liquid MultiVitamins, wants to discuss meds at PCP visit  naproxen  (NAPROSYN ) 500 MG tablet [id number]  Take 1 tablet (500 mg total) by mouth 2 (two) times daily with a meal. Poggi, Jenna E, PA-C  Active Spouse/Significant Other  Oxycodone  HCl 10 MG TABS [id number]  Take 1 tablet (10 mg total) by mouth every 6 (six) hours as needed. Take with stool softeners to avoid constipation Dasie Tinnie MATSU, NP  Active   polyethylene glycol (MIRALAX ) 17 g packet [id number]  Take 17 g by mouth 2 (two) times daily as needed for mild constipation or moderate constipation (Hold if diarrhea.). Dasie Tinnie MATSU, NP  Active   rosuvastatin  (CRESTOR ) 40 MG tablet [id number] No Take 1 tablet (40 mg total) by mouth every other day.  Patient not taking: Reported on 02/10/2024   Edman Marsa PARAS, DO Not Taking Active Spouse/Significant Other  senna (SENOKOT) 8.6 MG TABS tablet [id number]  Take 1-2 tablets (8.6-17.2 mg total) by mouth 2 (two) times daily as needed for mild constipation or moderate constipation (Hold if diarrhea). Dasie Tinnie MATSU, NP  Active   sildenafil  (REVATIO ) 20 MG tablet [id number]  TAKE 1 TO 5 TABLETS BY MOUTH ABOUT 30 MINUTES PRIOR TO SEX.START WITH 1 THEN INCREASE Edman Marsa PARAS, DO  Active Spouse/Significant Other  STIOLTO RESPIMAT  2.5-2.5 MCG/ACT AERS [id number]  INHALE 2 PUFFS EVERY DAY Edman Marsa PARAS, DO  Active   sucralfate  (CARAFATE ) 1 g tablet [id number]  Take 1 tablet (1 g total) by mouth 3 (three) times daily before meals.  Lenn Aran, MD  Active   traMADol (ULTRAM) 50 MG tablet [id number]  Take 50 mg by mouth every 6 (six) hours as needed. [provider]  Active             Goals Addressed             This Visit's Progress    [facility] Transitions of Care (TOC) Care Plan       Problems:  Recent Hospitalization for treatment of s/p Craniotomy for left inferior cerebellar mass  No Hospital Follow Up Provider appointment Patient/wife to make follow up appointment with PCP, appointment made today by [facility] CMA for 02/17/24 at 11 am, patient and wife accepted appointment.  Goal:  Over the next 30 days, the patient will not experience hospital readmission  Interventions:  Transitions of Care: Doctor Visits  - discussed the importance of doctor visits Post discharge activity limitations prescribed by provider reviewed Post-op wound/incision care reviewed with patient/caregiver Reviewed Signs and symptoms of infection  Patient Self Care Activities:  Attend all scheduled provider appointments  Call pharmacy for medication refills 3-7 days in advance of running out of medications Call provider office for new concerns or questions  Participate in Transition of Care Program/Attend TOC scheduled calls Take medications as prescribed   Wants only to discuss with PCP medications  Plan:  Telephone follow up appointment with care management team member scheduled for:  02/15/2024 at 2:30 PM The care management team will reach out to the patient again over the next 7- 10 days days. The patient will call PCP or Specialist as advised to changes in surgical site, headaches, s/s of infection.   Patient will increase walking and was recommended by [facility] for follow up with Outpatient Rehab therapy, only wants at [facility] but wants to make own call.  Phone number provided.  [phone].       Richerd Fish, RN, BSN, CCM [facility], [facility] Health RN Care Manager Direct Dial:  [phone]

## 2024-02-10 NOTE — Patient Instructions (Addendum)
 Visit Information  Thank you for taking time to visit with me today. Please don't hesitate to contact me if I can be of assistance to you before our next scheduled telephone appointment.  Our next appointment is by telephone on 02/15/24 at 2:30 pm  Following is a copy of your care plan:   Goals Addressed             This Visit's Progress    [facility]I Transitions of Care (TOC) Care Plan       Problems:  Recent Hospitalization for treatment of s/p Craniotomy for left inferior cerebellar mass  No Hospital Follow Up Provider appointment Patient/wife to make follow up appointment with PCP, appointment made today by [facility]I CMA for 02/17/24 at 11 am, patient and wife accepted appointment.  Goal:  Over the next 30 days, the patient will not experience hospital readmission  Interventions:  Transitions of Care: Doctor Visits  - discussed the importance of doctor visits Post discharge activity limitations prescribed by provider reviewed Post-op wound/incision care reviewed with patient/caregiver Reviewed Signs and symptoms of infection  Patient Self Care Activities:  Attend all scheduled provider appointments Call pharmacy for medication refills 3-7 days in advance of running out of medications Call provider office for new concerns or questions  Participate in Transition of Care Program/Attend TOC scheduled calls Take medications as prescribed   Wants only to discuss with PCP medications  Plan:  Telephone follow up appointment with care management team member scheduled for:  02/15/2024 at 2:30 PM The care management team will reach out to the patient again over the next 7- 10 days days. The patient will call PCP or Specialist as advised to changes in surgical site, headaches, s/s of infection.   Patient will increase walking and was recommended by [facility] for follow up with Outpatient Rehab therapy, only wants at [facility] but wants to make own call.  Phone number provided.  [phone].         Patient verbalizes understanding of instructions and care plan provided today and agrees to view in MyChart. Active MyChart status and patient understanding of how to access instructions and care plan via MyChart confirmed with patient.     Telephone follow up appointment with care management team member scheduled for: The patient has been provided with contact information for the care management team and has been advised to call with any health related questions or concerns.  The care management team will reach out to the patient again over the next 5-8  days.   Please call the care guide team at [phone] if you need to cancel or reschedule your appointment.   Please call the [place]  National Suicide Prevention Lifeline: 615-313-0837 or TTY: 952-523-5439 TTY (479)081-1071) to talk to a trained counselor if you are experiencing a Mental Health or Behavioral Health Crisis or need someone to talk to.  Brown Cape, RN, BSN, CCM [facility], [facility] Health RN Care Manager Direct Dial: [phone]

## 2024-02-15 ENCOUNTER — Other Ambulatory Visit: Payer: Self-pay

## 2024-02-15 NOTE — Patient Instructions (Signed)
 Visit Information  Thank you for taking time to visit with me today. Please don't hesitate to contact me if I can be of assistance to you before our next scheduled telephone appointment.  Our next appointment is by telephone on 02/22/2024 at 1:30 pm  Following is a copy of your care plan:   Goals Addressed             This Visit's Progress    [facility]I Transitions of Care (TOC) Care Plan   On track    Problems:  (Reviewed 02/15/24 Brown Cape) Recent Hospitalization for treatment of s/p Craniotomy for left inferior cerebellar mass  No Hospital Follow Up Provider appointment Patient/wife to make follow up appointment with PCP, appointment made today by VBCI CMA for 02/17/24 at 11 am, patient and wife accepted appointment. Awaiting follow up with appointments with [facility] 02/16/24 and with Winthrop Hawks, NP [place] [facility] 02/17/24 to discussed medications to be reconciled, did not wish to talk about all on his list  Goal:  Over the next 30 days, the patient will not experience hospital readmission  Interventions:  Transitions of Care: Doctor Visits  - discussed the importance of doctor visits Post discharge activity limitations prescribed by provider reviewed Post-op wound/incision care reviewed with patient/caregiver Reviewed Signs and symptoms of infection Patient to increase activity level has OP rehab orders has not made appointment until he see his providers this week.  Patient Self Care Activities:  Attend all scheduled provider appointments Call pharmacy for medication refills 3-7 days in advance of running out of medications Call provider office for new concerns or questions  Participate in Transition of Care Program/Attend TOC scheduled calls Take medications as prescribed   Wants only to discuss with PCP medications Get Clearance from providers on increasing activity at home, to decrease boredom and what he can do to help wife out in the home.  Plan:  Telephone  follow up appointment with care management team member scheduled for:  02/22/2024 at 1:30 PM The care management team will reach out to the patient again over the next 7- 10 days days. The patient will call PCP or Specialist as advised to changes in surgical site, headaches, s/s of infection.   * Patient will increase walking and was recommended by [facility] for follow up with Outpatient Rehab therapy, only wants at [facility] but wants to make own call.  Phone number provided.  [phone]. Patient states he wants to talk with oncologist tomorrow 02/16/24 appointment regarding starting any Outpatient rehab program        Patient verbalizes understanding of instructions and care plan provided today and agrees to view in MyChart. Active MyChart status and patient understanding of how to access instructions and care plan via MyChart confirmed with patient.     The patient has been provided with contact information for the care management team and has been advised to call with any health related questions or concerns.  The care management team will reach out to the patient again over the next 7-10 business  days.  Follow up with provider re: both providers this week, did accept PCP follow up for 02/17/24   Please call the care guide team at [phone] if you need to cancel or reschedule your appointment.   Please call the [place]  National Suicide Prevention Lifeline: (817) 061-7801 or TTY: 567 667 5005 TTY 312 617 7347) to talk to a trained counselor call 1-800-273-TALK (toll free, 24 hour hotline) if you are experiencing a Mental Health or Behavioral Health Crisis  or need someone to talk to.  Brown Cape, RN, BSN, CCM [facility], [facility] Health RN Care Manager Direct Dial: [phone]

## 2024-02-15 NOTE — Transitions of Care (Post Inpatient/ED Visit) (Signed)
 Transition of Care week 3  Visit Note  02/15/2024  Name: Ethan Fitzgerald MRN: [id number]          DOB: 1956/06/06  Situation: Patient enrolled in [facility] 30-day program. Visit completed with patient by telephone.   Background:   Initial Transition Care Management Follow-up Telephone Call    Past Medical History:  Diagnosis Date   COPD (chronic obstructive pulmonary disease) (HCC)    mild   COVID-19 09/2019   Depression    Erectile dysfunction    Headache    daily, stress headaches   Knee pain, left    Seasonal allergies    Stroke (HCC)    Tobacco dependence     Assessment: Patient Reported Symptoms: Cognitive Cognitive Status: Alert and oriented to person, place, and time      Neurological Neurological Review of Symptoms: No symptoms reported Neurological Management Strategies: Activity Neurological Self-Management Outcome: 4 (good)  HEENT HEENT Symptoms Reported: No symptoms reported      Cardiovascular Cardiovascular Symptoms Reported: No symptoms reported Weight: 156 lb (70.8 kg) (Differences in scales per patient)  Respiratory Respiratory Symptoms Reported: No symptoms reported Respiratory Conditions: COPD  Endocrine Patient reports the following symptoms related to hypoglycemia or hyperglycemia : No symptoms reported    Gastrointestinal Gastrointestinal Symptoms Reported: No symptoms reported      Genitourinary Genitourinary Symptoms Reported: No symptoms reported    Integumentary Integumentary Symptoms Reported: Incision Additional Integumentary Details: Staples out and clean and dry    Musculoskeletal Musculoskelatal Symptoms Reviewed: Weakness Additional Musculoskeletal Details: better        Psychosocial Psychosocial Symptoms Reported: No symptoms reported (Feeling more bored than anything)         Vitals:   02/15/24 1434  BP: 120/80    Medications Reviewed Today     Reviewed by Jamie Mccoy, RN (Registered Nurse) on 02/15/24 at 1436   Med List Status: <None>   Medication Order Taking? Sig Documenting Provider Last Dose Status Informant  acetaminophen  (TYLENOL ) 325 MG tablet [id number] No Take 650 mg by mouth. [provider] Taking Active   albuterol  (VENTOLIN  HFA) 108 (90 Base) MCG/ACT inhaler [id number] No Inhale 2 puffs into the lungs every 4 (four) hours as needed for wheezing or shortness of breath. Raina Bunting, DO Taking Active   aspirin  EC 325 MG EC tablet [id number] No Take 1 tablet (325 mg total) by mouth daily. Zelda Hickman, PA-C Taking Active Spouse/Significant Other           Med Note Anne Kidd Mar 23, 2023  2:19 PM)    b complex vitamins capsule [id number] No Take 1 capsule by mouth daily. [provider] Taking Active Spouse/Significant Other  Cholecalciferol (VITAMIN D ) 50 MCG (2000 UT) tablet [id number] No Take 2,000 Units by mouth daily. [provider] Taking Active Spouse/Significant Other  dexamethasone  (DECADRON ) 4 MG tablet [id number] No Take 4 mg by mouth. [provider] Taking Active   ezetimibe  (ZETIA ) 10 MG tablet [id number] No Take 1 tablet (10 mg total) by mouth daily.  Patient not taking: Reported on 02/10/2024   Dorothe Gaster, NP Not Taking Active   fluticasone  (FLONASE ) 50 MCG/ACT nasal spray [id number] No Place 2 sprays into both nostrils daily.  Patient taking differently: Place 2 sprays into both nostrils daily as needed for allergies.   Raina Bunting, DO Taking Active Spouse/Significant Other  melatonin 5 MG TABS [id number] No Take 5 mg by mouth at bedtime  as needed (sleep). [provider] Taking Active Spouse/Significant Other           Med Note Miriam American, Rowan Cooter   Fri Feb 10, 2024  1:07 PM) Needs to follow up with Winthrop Hawks, NP  midodrine  (PROAMATINE ) 5 MG tablet [id number] No TAKE 1 TABLET BY MOUTH 3 TIMES DAILY WITH MEALS. Dorothe Gaster, NP Taking Active   Multiple Vitamin (MULTIVITAMIN WITH MINERALS) TABS  tablet [id number] No Take 1 tablet by mouth daily. [provider] Taking Active Spouse/Significant Other  Multiple Vitamins-Minerals (IMMUNE SUPPORT VITAMIN C PO) [id number] No Take 1 tablet by mouth daily. [provider] Taking Active Spouse/Significant Other           Med Note Miriam American, Rowan Cooter   Fri Feb 10, 2024  1:37 PM) Patient states taking liquid MultiVitamins, wants to discuss meds at PCP visit  naproxen  (NAPROSYN ) 500 MG tablet [id number] No Take 1 tablet (500 mg total) by mouth 2 (two) times daily with a meal. Poggi, Jenna E, PA-C Taking Active Spouse/Significant Other  Oxycodone  HCl 10 MG TABS [id number] No Take 1 tablet (10 mg total) by mouth every 6 (six) hours as needed. Take with stool softeners to avoid constipation Nelda Balsam, NP Taking Active   polyethylene glycol (MIRALAX ) 17 g packet [id number] No Take 17 g by mouth 2 (two) times daily as needed for mild constipation or moderate constipation (Hold if diarrhea.). Nelda Balsam, NP Taking Active   rosuvastatin  (CRESTOR ) 40 MG tablet [id number] No Take 1 tablet (40 mg total) by mouth every other day.  Patient not taking: Reported on 02/10/2024   Raina Bunting, DO Not Taking Active Spouse/Significant Other  senna (SENOKOT) 8.6 MG TABS tablet [id number] No Take 1-2 tablets (8.6-17.2 mg total) by mouth 2 (two) times daily as needed for mild constipation or moderate constipation (Hold if diarrhea). Nelda Balsam, NP Taking Active   sildenafil  (REVATIO ) 20 MG tablet [id number] No TAKE 1 TO 5 TABLETS BY MOUTH ABOUT 30 MINUTES PRIOR TO SEX.START WITH 1 THEN INCREASE Raina Bunting, DO Taking Active Spouse/Significant Other  STIOLTO RESPIMAT  2.5-2.5 MCG/ACT AERS [id number] No INHALE 2 PUFFS EVERY DAY [place]o [place] Kayleen Party, DO Taking Active   sucralfate  (CARAFATE ) 1 g tablet [id number] No Take 1 tablet (1 g total) by mouth 3 (three) times daily before meals. Glenis Langdon, MD Taking Active   traMADol  (ULTRAM) 50 MG tablet [id number] No Take 50 mg by mouth every 6 (six) hours as needed. [provider] Taking Active           *Please note:  Patient states he did not wish to go through the list of medications that nothing had changed and will discuss his medications with oncologist tomorrow 02/16/24 and with PCP on 02/17/24 at scheduled appointments.  Brown Cape, RN, BSN, CCM [facility], [facility] Health RN Care Manager Direct Dial: [phone]

## 2024-02-16 ENCOUNTER — Encounter: Payer: Self-pay | Admitting: Oncology

## 2024-02-16 ENCOUNTER — Ambulatory Visit
Admission: RE | Admit: 2024-02-16 | Discharge: 2024-02-16 | Disposition: A | Discharge status: Home / Self Care | Source: Ambulatory Visit | Attending: Radiation Oncology | Admitting: Radiation Oncology

## 2024-02-16 ENCOUNTER — Inpatient Hospital Stay: Attending: Oncology | Admitting: Oncology

## 2024-02-16 VITALS — BP 102/80 | HR 78 | Temp 97.3°F | Resp 16 | Ht 70.0 in | Wt 149.9 lb

## 2024-02-16 DIAGNOSIS — C3411 Malignant neoplasm of upper lobe, right bronchus or lung: Secondary | ICD-10-CM | POA: Insufficient documentation

## 2024-02-16 DIAGNOSIS — C7931 Secondary malignant neoplasm of brain: Secondary | ICD-10-CM | POA: Insufficient documentation

## 2024-02-16 DIAGNOSIS — Z510 Encounter for antineoplastic radiation therapy: Secondary | ICD-10-CM | POA: Insufficient documentation

## 2024-02-16 DIAGNOSIS — G9389 Other specified disorders of brain: Secondary | ICD-10-CM

## 2024-02-16 NOTE — Progress Notes (Signed)
 [facility]  [phone] [phone]) [phone]  ID: Ethan Fitzgerald OB: 01/23/1956  MR#: [id number]  QIO#:[id number]  Patient Care Team: Dorothe Gaster, NP as PCP - General (Nurse Practitioner) Lisabeth Rider, MD as Referring Physician (Neurology) Drake Gens, RN as Oncology Nurse Navigator Adrian Alba, Deadra Everts, MD as Consulting Physician (Oncology) Jamie Mccoy, RN as VBCI Care Management  CHIEF COMPLAINT: Stage IIIa adenocarcinoma of the right upper lobe lung, now with isolated brain metastasis.  INTERVAL HISTORY: Patient returns to clinic today for further evaluation postoperatively after surgical removal of the 3 cm lesion in his left cerebellum.  He tolerated his procedure well without significant side effects.  He continues to have chronic cough, but otherwise feels well. He has no new neurologic complaints.  He does not complain of pain today.   He denies any recent fevers or illnesses. He denies any chest pain, shortness of breath, or hemoptysis.  He has no nausea, vomiting, constipation, or diarrhea.  He has no urinary complaints.  Patient offers no further specific complaints today.  REVIEW OF SYSTEMS:   Review of Systems  Constitutional: Negative.  Negative for fever, malaise/fatigue and weight loss.  Respiratory: Negative.  Negative for cough, hemoptysis and shortness of breath.   Cardiovascular: Negative.  Negative for chest pain and leg swelling.  Gastrointestinal: Negative.  Negative for abdominal pain.  Genitourinary: Negative.  Negative for dysuria.  Musculoskeletal: Negative.  Negative for back pain.  Skin: Negative.  Negative for rash.  Neurological: Negative.  Negative for dizziness, focal weakness, weakness and headaches.  Psychiatric/Behavioral: Negative.  The patient is not nervous/anxious.   All other systems reviewed and are negative.   As per HPI. Otherwise, a complete review of systems is negative.  PAST MEDICAL  HISTORY: Past Medical History:  Diagnosis Date   COPD (chronic obstructive pulmonary disease) (HCC)    mild   COVID-19 09/2019   Depression    Erectile dysfunction    Headache    daily, stress headaches   Knee pain, left    Seasonal allergies    Stroke (HCC)    Tobacco dependence     PAST SURGICAL HISTORY: Past Surgical History:  Procedure Laterality Date   BACK SURGERY     metal plate in back   COLONOSCOPY     COLONOSCOPY WITH PROPOFOL  N/A 04/26/2019   Procedure: COLONOSCOPY WITH PROPOFOL ;  Surgeon: Luke Salaam, MD;  Location: [facility] ENDOSCOPY;  Service: Gastroenterology;  Laterality: N/A;   FINE NEEDLE ASPIRATION  03/03/2023   Procedure: FINE NEEDLE ASPIRATION;  Surgeon: Vergia Glasgow, MD;  Location: [facility] ENDOSCOPY;  Service: Pulmonary;;   HERNIA REPAIR Right    IR ANGIO INTRA EXTRACRAN SEL COM CAROTID INNOMINATE UNI L MOD SED  03/27/2020   IR ANGIO VERTEBRAL SEL SUBCLAVIAN INNOMINATE UNI L MOD SED  03/27/2020   IR ANGIO VERTEBRAL SEL VERTEBRAL UNI R MOD SED  03/27/2020   IR CT HEAD LTD  03/27/2020   IR IMAGING GUIDED PORT INSERTION  03/29/2023   IR PERCUTANEOUS ART THROMBECTOMY/INFUSION INTRACRANIAL INC DIAG ANGIO  03/27/2020   KNEE ARTHROSCOPY Left 03/28/2015   Procedure: Arthroscopic partial medial meniscectomy plus chondral debridement;  Surgeon: Josephus Nida, MD;  Location: [facility];  Service: Orthopedics;  Laterality: Left;   RADIOLOGY WITH ANESTHESIA N/A 03/27/2020   Procedure: IR WITH ANESTHESIA;  Surgeon: Luellen Sages, MD;  Location: [facility] OR;  Service: Radiology;  Laterality: N/A;   VIDEO BRONCHOSCOPY WITH ENDOBRONCHIAL ULTRASOUND  03/03/2023   Procedure:  VIDEO BRONCHOSCOPY WITH ENDOBRONCHIAL ULTRASOUND;  Surgeon: Vergia Glasgow, MD;  Location: [facility] ENDOSCOPY;  Service: Pulmonary;;    FAMILY HISTORY: Family History  Problem Relation Age of Onset   Diabetes Mother    Heart attack Mother 75   Cancer Father        liver    COPD Brother    HIV/AIDS  Brother    Prostate cancer Neg Hx    Colon cancer Neg Hx     ADVANCED DIRECTIVES (Y/N):  N  HEALTH MAINTENANCE: Social History   Tobacco Use   Smoking status: Former    Current packs/day: 0.00    Average packs/day: 1.5 packs/day for 49.0 years (73.5 ttl pk-yrs)    Types: Cigarettes    Start date: 66    Quit date: 09/14/2017    Years since quitting: 6.4   Smokeless tobacco: Former  Building services engineer status: Never Used  Substance Use Topics   Alcohol use: Not Currently    Comment: 6 pack on weekend   Drug use: No     Colonoscopy:  PAP:  Bone density:  Lipid panel:  No Known Allergies  Current Outpatient Medications  Medication Sig Dispense Refill   acetaminophen  (TYLENOL ) 325 MG tablet Take 650 mg by mouth.     albuterol  (VENTOLIN  HFA) 108 (90 Base) MCG/ACT inhaler Inhale 2 puffs into the lungs every 4 (four) hours as needed for wheezing or shortness of breath. 1 each 2   aspirin  EC 325 MG EC tablet Take 1 tablet (325 mg total) by mouth daily. 90 tablet 0   b complex vitamins capsule Take 1 capsule by mouth daily.     baclofen  (LIORESAL ) 10 MG tablet Take 10 mg by mouth 3 (three) times daily as needed.     Cholecalciferol (VITAMIN D ) 50 MCG (2000 UT) tablet Take 2,000 Units by mouth daily.     dexamethasone  (DECADRON ) 4 MG tablet Take 4 mg by mouth.     fluticasone  (FLONASE ) 50 MCG/ACT nasal spray Place 2 sprays into both nostrils daily. 48 g 3   midodrine  (PROAMATINE ) 5 MG tablet TAKE 1 TABLET BY MOUTH 3 TIMES DAILY WITH MEALS. 270 tablet 1   Multiple Vitamin (MULTIVITAMIN WITH MINERALS) TABS tablet Take 1 tablet by mouth daily.     Multiple Vitamins-Minerals (IMMUNE SUPPORT VITAMIN C PO) Take 1 tablet by mouth daily.     senna (SENOKOT) 8.6 MG TABS tablet Take 1-2 tablets (8.6-17.2 mg total) by mouth 2 (two) times daily as needed for mild constipation or moderate constipation (Hold if diarrhea). 120 tablet 0   sildenafil  (REVATIO ) 20 MG tablet TAKE 1 TO 5 TABLETS BY  MOUTH ABOUT 30 MINUTES PRIOR TO SEX.START WITH 1 THEN INCREASE 30 tablet 5   STIOLTO RESPIMAT  2.5-2.5 MCG/ACT AERS INHALE 2 PUFFS EVERY DAY 12 g 3   ezetimibe  (ZETIA ) 10 MG tablet Take 1 tablet (10 mg total) by mouth daily. (Patient not taking: Reported on 02/16/2024) 90 tablet 3   melatonin 5 MG TABS Take 5 mg by mouth at bedtime as needed (sleep). (Patient not taking: Reported on 02/16/2024)     naproxen  (NAPROSYN ) 500 MG tablet Take 1 tablet (500 mg total) by mouth 2 (two) times daily with a meal. (Patient not taking: Reported on 02/16/2024) 10 tablet 0   Oxycodone  HCl 10 MG TABS Take 1 tablet (10 mg total) by mouth every 6 (six) hours as needed. Take with stool softeners to avoid constipation (Patient not taking: Reported on  02/16/2024) 120 tablet 0   polyethylene glycol (MIRALAX ) 17 g packet Take 17 g by mouth 2 (two) times daily as needed for mild constipation or moderate constipation (Hold if diarrhea.). (Patient not taking: Reported on 02/16/2024) 100 each 2   rosuvastatin  (CRESTOR ) 40 MG tablet Take 1 tablet (40 mg total) by mouth every other day. (Patient not taking: Reported on 02/16/2024) 45 tablet 3   sucralfate  (CARAFATE ) 1 g tablet Take 1 tablet (1 g total) by mouth 3 (three) times daily before meals. (Patient not taking: Reported on 02/16/2024) 90 tablet 1   traMADol (ULTRAM) 50 MG tablet Take 50 mg by mouth every 6 (six) hours as needed. (Patient not taking: Reported on 02/16/2024)     No current facility-administered medications for this visit.    OBJECTIVE: Vitals:   02/16/24 0903  BP: 102/80  Pulse: 78  Resp: 16  Temp: (!) 97.3 F (36.3 C)  SpO2: 100%       Body mass index is 21.51 kg/m.    ECOG FS:0 - Asymptomatic  General: Well-developed, well-nourished, no acute distress. Eyes: Pink conjunctiva, anicteric sclera. HEENT: Normocephalic, moist mucous membranes.  Well-healed surgical scar. Lungs: No audible wheezing or coughing. Heart: Regular rate and rhythm. Abdomen:  Soft, nontender, no obvious distention. Musculoskeletal: No edema, cyanosis, or clubbing. Neuro: Alert, answering all questions appropriately. Cranial nerves grossly intact. Skin: No rashes or petechiae noted. Psych: Normal affect.  LAB RESULTS:  Lab Results  Component Value Date   NA 138 01/18/2024   K 4.1 01/18/2024   CL 108 01/18/2024   CO2 24 01/18/2024   GLUCOSE 119 (H) 01/18/2024   BUN 20 01/18/2024   CREATININE 0.89 01/18/2024   CALCIUM  9.8 01/18/2024   PROT 7.2 01/18/2024   ALBUMIN 3.8 01/18/2024   AST 26 01/18/2024   ALT 21 01/18/2024   ALKPHOS 76 01/18/2024   BILITOT 1.4 (H) 01/18/2024   GFRNONAA >60 01/18/2024   GFRAA 85 12/22/2020    Lab Results  Component Value Date   WBC 10.5 01/18/2024   NEUTROABS 4.6 01/11/2024   HGB 15.4 01/18/2024   HCT 44.7 01/18/2024   MCV 91.2 01/18/2024   PLT 253 01/18/2024     STUDIES: MR Brain W and Wo Contrast Result Date: 01/18/2024 CLINICAL DATA:  Metastatic disease EXAM: MRI HEAD WITHOUT AND WITH CONTRAST TECHNIQUE: Multiplanar, multiecho pulse sequences of the brain and surrounding structures were obtained without and with intravenous contrast. CONTRAST:  7mL GADAVIST  GADOBUTROL  1 MMOL/ML IV SOLN COMPARISON:  02/11/2023 FINDINGS: Brain: There is no acute infarct or acute intracranial hemorrhage. There is a mass of the left cerebellum with a large amount of surrounding vasogenic edema causing mass effect on the fourth ventricle. Contrast enhancing component of the mass measures 3.1 x 2.6 cm. Unchanged area of right frontal encephalomalacia. No other contrast-enhancing lesions. Major midline structures are normal. No hydrocephalus. Vascular: Chronic bilateral ICA occlusion Skull and upper cervical spine: Normal marrow signal. Sinuses/Orbits: Paranasal sinuses are clear. No mastoid effusion. Normal orbits. Other: None IMPRESSION: 1. A 3.1 x 2.6 cm mass of the left cerebellum with a large amount of surrounding vasogenic edema causing  mass effect on the fourth ventricle. No hydrocephalus. 2. Unchanged area of right frontal encephalomalacia. 3. Chronic bilateral ICA occlusion. Electronically Signed   By: Juanetta Nordmann M.D.   On: 01/18/2024 22:45   CT CHEST ABDOMEN PELVIS W CONTRAST Result Date: 01/18/2024 CLINICAL DATA:  Brain mass assess for metastatic disease EXAM: CT CHEST, ABDOMEN, AND PELVIS  WITH CONTRAST TECHNIQUE: Multidetector CT imaging of the chest, abdomen and pelvis was performed following the standard protocol during bolus administration of intravenous contrast. RADIATION DOSE REDUCTION: This exam was performed according to the departmental dose-optimization program which includes automated exposure control, adjustment of the mA and/or kV according to patient size and/or use of iterative reconstruction technique. CONTRAST:  OMNIPAQUE  IOHEXOL  300 MG/ML  SOLN COMPARISON:  CT chest 10/14/2023, 06/26/2023, 02/05/2023, PET CT 08/17/2023 FINDINGS: CT CHEST FINDINGS Cardiovascular: Mild aortic atherosclerosis. No aneurysm. Normal cardiac size. Trace pericardial effusion. Left-sided central venous port with tip at the cavoatrial junction Mediastinum/Nodes: Patent trachea. No thyroid  mass. No increasing lymph nodes. Esophagus shows small hiatal hernia Lungs/Pleura: Advanced emphysema. Scarring in the right upper lobe. Right upper lobe lung mass measures 4.5 x 3.6 cm on series 2, image 23, previously 5.1 x 4.2 cm. Extension to superior segment of right lower lobe. Mass abuts the right posterior pleural surface. Mild surrounding distortion and mild ground-glass disease as before. No additional suspicious lung nodules. Musculoskeletal: Sternum appears intact. No acute or suspicious osseous abnormality. CT ABDOMEN PELVIS FINDINGS Hepatobiliary: Gallstones. No focal hepatic abnormality or biliary dilatation Pancreas: Unremarkable. No pancreatic ductal dilatation or surrounding inflammatory changes. Spleen: Normal in size without focal  abnormality. Adrenals/Urinary Tract: Adrenal glands are normal. Kidneys show no hydronephrosis. The bladder is unremarkable Stomach/Bowel: The stomach is nonenlarged. There is no dilated small bowel. No acute bowel wall thickening Vascular/Lymphatic: Aortic atherosclerosis. No enlarged abdominal or pelvic lymph nodes. Reproductive: Prostate is unremarkable. Other: Negative for pelvic effusion or free air. Small fat containing left inguinal hernia Musculoskeletal: No acute or suspicious osseous abnormality. IMPRESSION: 1. Slight decrease in size of right upper lobe lung mass compared with CT from February. Stable scarring and distortion in the right upper lobe. 2. No CT evidence for metastatic disease to the abdomen or pelvis. 3. Gallstones. 4. Emphysema. Aortic Atherosclerosis (ICD10-I70.0) and Emphysema (ICD10-J43.9). Electronically Signed   By: Esmeralda Hedge M.D.   On: 01/18/2024 21:06   CT Head Wo Contrast Result Date: 01/18/2024 CLINICAL DATA:  Headache with vomiting EXAM: CT HEAD WITHOUT CONTRAST TECHNIQUE: Contiguous axial images were obtained from the base of the skull through the vertex without intravenous contrast. RADIATION DOSE REDUCTION: This exam was performed according to the departmental dose-optimization program which includes automated exposure control, adjustment of the mA and/or kV according to patient size and/or use of iterative reconstruction technique. COMPARISON:  CT brain 02/05/2023, MRI 02/11/2023 FINDINGS: Brain: No acute territorial infarct or hemorrhage is visualized. Interim development lobulated left cerebellar mass measuring 3.1 x 2.3 cm on series 2, image 6, with significant surrounding low-density edema and local mass effect. Mass effect on fourth ventricle. Interval enlargement of lateral and third ventricles compared to prior head CT from June. Chronic right MCA infarct with ex vacuo dilatation of right lateral ventricle. Mild atrophy Vascular: No hyperdense vessels.  Carotid  vascular calcification Skull: Normal. Negative for fracture or focal lesion. Sinuses/Orbits: No acute finding. Other: None IMPRESSION: 1. Interim development of lobulated left cerebellar mass measuring 3.1 x 2.3 cm with significant surrounding low-density edema and local mass effect. Mass effect on fourth ventricle. Interval enlargement of lateral and third ventricles compared to prior head CT from June. Findings are suspicious for metastatic disease. Recommend MRI brain with and without contrast for further assessment. 2. Chronic right MCA infarct. Critical Value/emergent results were called by telephone at the time of interpretation on 01/18/2024 at 6:19 pm to provider Bryson Carbine , who  verbally acknowledged these results. Electronically Signed   By: Esmeralda Hedge M.D.   On: 01/18/2024 18:19     ASSESSMENT: Stage IIIa adenocarcinoma of the right upper lobe lung.  PLAN:    Stage IIIa adenocarcinoma of the right upper lobe lung: Pathology and imaging reviewed independently confirming diagnosis and stage of disease.  PET scan results from February 14, 2023 reviewed independently with no obvious evidence of metastatic disease.  MRI of the brain from February 11, 2023 also negative for metastatic disease.  Surgical resection was not an option, therefore patient proceeded with concurrent XRT along with weekly carboplatin  and Taxol  which was completed on May 27, 2023.  Patient initiated his year-long maintenance durvalumab  on June 08, 2023.  Patient's most recent imaging with CT scan on Jan 18, 2024 did not reveal any evidence of recurrent or progressive disease.  MRI of the brain revealed isolated brain metastasis.  Patient most recently received cycle 16 of durvalumab  on Jan 11, 2024.  Plan is to restart durvalumab  in the next 1 to 2 weeks, but patient plans to meet with social work to discuss financial constraints and then call clinic when he wishes to restart.  Brain metastasis: MRI results from Jan 18, 2024  reviewed independently and reported as above revealing a 3.1 x 2.5 cm left cerebellar brain mass consistent with metastatic disease.  No other disease was noted.  Patient underwent surgery at [facility] on Jan 25, 2024.  Recommendation is adjuvant XRT and patient has consultation with radiation oncology today.  Case discussed with neurosurgery and he is cleared to initiate treatment.    Chest pain: Patient does not complain of this today.  Continue oxycodone  as needed. Iron  deficiency anemia: Resolved.  He last received IV Venofer  on May 25, 2023. Dizziness/nausea/headache: Resolved.   History of CVA: Monitor.  I spent a total of 30 minutes reviewing chart data, face-to-face evaluation with the patient, counseling and coordination of care as detailed above.   Patient expressed understanding and was in agreement with this plan. He also understands that He can call clinic at any time with any questions, concerns, or complaints.    Cancer Staging  Adenocarcinoma of upper lobe of right lung [facility]) Staging form: Lung, AJCC 8th Edition - Clinical stage from 03/15/2023: Stage IIIA (cT4, cN0, cM0) - Signed by Shellie Dials, MD on 03/15/2023 Stage prefix: Initial diagnosis   Shellie Dials, MD   02/16/2024 10:01 AM

## 2024-02-16 NOTE — Progress Notes (Signed)
 Wife mentioned concerns about a cough that he has had since surgery. States he does have a tightness feeling in the back of his head.

## 2024-02-16 NOTE — Progress Notes (Signed)
 Radiation Oncology Follow up Note  Name: Ethan Fitzgerald   Date:   02/16/2024 MRN:  [id number] DOB: Jun 13, 1956    This 68 y.o. male presents to the clinic today for evaluation of solitary brain metastasis started status post surgical resection at [facility].  REFERRING PROVIDER: Dorothe Gaster, NP  HPI: Patient is a 68 year old male now at 8 months having completed concurrent chemoradiation therapy for stage IIIa (T4 N1 M0) adenocarcinoma the right lower lobe.Ethan Fitzgerald  He recently presented to the ER with new onset dizziness and headaches and MRI showed a 3 cm lesion in the left cerebellum.  He underwent craniotomy and gross total resection for metastatic carcinoma consistent with lung primary.  Postop MRI scan showed again gross total resection of left inferior cerebellar mass with postoperative changes.  There was mild interval decrease in T2 FLAIR hyperintense signal in the left cerebellar hemisphere with mild decrease in mass effect on the fourth ventricle.  He is currently off steroids he specifically denies any change in visual fields or any focal neurologic deficits.  Is still somewhat unsteady on his feet although is not using a walker and is using a cane for assistance at this time.  His incision is well-healed.  He is currently on maintenance Durvalumab  which I believe will be held during his radiation treatments.  COMPLICATIONS OF TREATMENT: none  FOLLOW UP COMPLIANCE: keeps appointments   PHYSICAL EXAM:  There were no vitals taken for this visit. Crude visual fields are within normal range proprioception is intact no focal neurologic deficits are identified.  Incision of the scalp is well-healed.  Well-developed well-nourished patient in NAD. HEENT reveals PERLA, EOMI, discs not visualized.  Oral cavity is clear. No oral mucosal lesions are identified. Neck is clear without evidence of cervical or supraclavicular adenopathy. Lungs are clear to A&P. Cardiac examination is essentially unremarkable with  regular rate and rhythm without murmur rub or thrill. Abdomen is benign with no organomegaly or masses noted. Motor sensory and DTR levels are equal and symmetric in the upper and lower extremities. Cranial nerves II through XII are grossly intact. Proprioception is intact. No peripheral adenopathy or edema is identified. No motor or sensory levels are noted. Crude visual fields are within normal range.  RADIOLOGY RESULTS: MRI scans pre and postsurgery are reviewed compatible with above-stated findings  PLAN: At this time I am doing a thin cut MRI scan to rule out any possibility of other metastatic lesions.  Would plan on delivering 30 Gray in 5 fractions using IMRT treatment planning and delivery to his resection cavity.  Risks and benefits of treatment clued possible hair loss skin reaction fatigue alteration of blood counts all were described in detail to the patient.  He comprehends her treatment plan well.  I have set him up for simulation in about a week and a half after his think that MRI scan is performed.  I would also like to see any postoperative changes that may alter my treatment planning.  Patient wife both comprehend my treatment plan well.  I would like to take this opportunity to thank you for allowing me to participate in the care of your patient.Glenis Langdon, MD

## 2024-02-17 ENCOUNTER — Ambulatory Visit (INDEPENDENT_AMBULATORY_CARE_PROVIDER_SITE_OTHER): Admitting: Nurse Practitioner

## 2024-02-17 ENCOUNTER — Encounter: Payer: Self-pay | Admitting: Nurse Practitioner

## 2024-02-17 VITALS — BP 96/60 | HR 60 | Temp 97.7°F | Ht 70.0 in | Wt 149.6 lb

## 2024-02-17 DIAGNOSIS — E785 Hyperlipidemia, unspecified: Secondary | ICD-10-CM | POA: Diagnosis not present

## 2024-02-17 DIAGNOSIS — Z09 Encounter for follow-up examination after completed treatment for conditions other than malignant neoplasm: Secondary | ICD-10-CM | POA: Insufficient documentation

## 2024-02-17 MED ORDER — EZETIMIBE 10 MG PO TABS: 10.0000 mg | ORAL_TABLET | Freq: Every day | ORAL | 3 refills | Status: DC

## 2024-02-17 NOTE — Assessment & Plan Note (Signed)
 History of the same in the setting of ASCVD.  Patient does not want to take a statin as he states his father passed away due to complications of medication.  Will keep patient on ezetimibe  he will restart Repatha patient's last LDL is not at goal goal be less than 70

## 2024-02-17 NOTE — Assessment & Plan Note (Signed)
 Did review hospital note labs and imaging. Patient is currently maintained on dexamethasone  and Protonix  for gastric protection.  Will reach out a couple weeks to see the plan and how long he will be on dexamethasone .  If continued use send and CBG supplies as patient's last A1c was 6.0 in prediabetic

## 2024-02-17 NOTE — Progress Notes (Signed)
 Acute Office Visit  Subjective:     [name], male    DOB: March 24, 1956, 68 y.o.   MRN: [id number]  Chief Complaint  Patient presents with   Hospitalization Follow-up    Pt complains of feeling a little better since having brain surgery.    Medication Management    HPI Patient is in today for Hosptial follow up  Patient was admitted to [facility] on 01/25/2024 discharged 01/27/2024 with diagnosis of brain tumor and lung cancer that metastasized to the brain.  Patient did undergo a BrainLab guided suboccipital craniotomy for the left cerebellar mass.  He presented to the hospital with complaints of headaches balance disturbances vomiting blurry vision with radiographic evidence of the left cerebellar mass  Review of Systems  Constitutional:  Negative for chills and fever.  Respiratory:  Negative for shortness of breath.   Cardiovascular:  Negative for chest pain.  Neurological:  Negative for dizziness and headaches.        Objective:    BP 96/60   Pulse 60   Temp 97.7 F (36.5 C) (Oral)   Ht 5' 10 (1.778 m)   Wt 149 lb 9.6 oz (67.9 kg)   SpO2 99%   BMI 21.47 kg/m  BP Readings from Last 3 Encounters:  02/17/24 96/60  02/16/24 102/80  02/15/24 120/80   Wt Readings from Last 3 Encounters:  02/17/24 149 lb 9.6 oz (67.9 kg)  02/16/24 149 lb 14.4 oz (68 kg)  02/15/24 156 lb (70.8 kg)   SpO2 Readings from Last 3 Encounters:  02/17/24 99%  02/16/24 100%  01/20/24 98%      Physical Exam Vitals and nursing note reviewed.  Constitutional:      Appearance: Normal appearance.   Cardiovascular:     Rate and Rhythm: Normal rate and regular rhythm.     Heart sounds: Normal heart sounds.  Pulmonary:     Effort: Pulmonary effort is normal.     Breath sounds: Normal breath sounds.   Neurological:     General: No focal deficit present.     Mental Status: He is alert.     Deep Tendon Reflexes:     Reflex Scores:      Bicep reflexes are 2+ on the right  side and 2+ on the left side.      Patellar reflexes are 2+ on the right side and 2+ on the left side.    Comments: Bilateral upper and lower extremity strength 5/5    No results found for any visits on 02/17/24.      Assessment & Plan:   Problem List Items Addressed This Visit       Other   Hyperlipidemia   History of the same in the setting of ASCVD.  Patient does not want to take a statin as he states his father passed away due to complications of medication.  Will keep patient on ezetimibe  he will restart Repatha patient's last LDL is not at goal goal be less than 70      Relevant Medications   ezetimibe  (ZETIA ) 10 MG tablet   Hospital discharge follow-up - Primary   Did review hospital note labs and imaging. Patient is currently maintained on dexamethasone  and Protonix  for gastric protection.  Will reach out a couple weeks to see the plan and how long he will be on dexamethasone .  If continued use send and CBG supplies as patient's last A1c was 6.0 in prediabetic  Meds ordered this encounter  Medications   ezetimibe  (ZETIA ) 10 MG tablet    Sig: Take 1 tablet (10 mg total) by mouth daily.    Dispense:  90 tablet    Refill:  3    Supervising Provider:   Deri Fleet A [[id number]]    Return in about 2 months (around 04/18/2024) for HDL/CVA/CA recheck .  Margarie Shay, NP

## 2024-02-17 NOTE — Patient Instructions (Signed)
 Nice to see you today I want to see you in 2 months  Consider the injectable repatha for cholesterol management  I have sent in zetia  to the mail order pharmacy

## 2024-02-21 ENCOUNTER — Ambulatory Visit
Admission: RE | Admit: 2024-02-21 | Discharge: 2024-02-21 | Disposition: A | Discharge status: Home / Self Care | Source: Ambulatory Visit | Attending: Radiation Oncology | Admitting: Radiation Oncology

## 2024-02-21 DIAGNOSIS — C7931 Secondary malignant neoplasm of brain: Secondary | ICD-10-CM | POA: Insufficient documentation

## 2024-02-21 MED ORDER — GADOBUTROL 1 MMOL/ML IV SOLN
6.0000 mL | Freq: Once | INTRAVENOUS | Status: AC | PRN
  Administered 2024-02-21: 6 mL via INTRAVENOUS

## 2024-02-22 ENCOUNTER — Ambulatory Visit

## 2024-02-22 ENCOUNTER — Telehealth: Payer: Self-pay

## 2024-02-27 ENCOUNTER — Telehealth: Payer: Self-pay

## 2024-02-27 DIAGNOSIS — Z9889 Other specified postprocedural states: Secondary | ICD-10-CM

## 2024-02-27 NOTE — Transitions of Care (Post Inpatient/ED Visit) (Signed)
 Transition of Care week 4  Visit Note  02/27/2024  Name: Ethan Fitzgerald MRN: [id number]          DOB: Feb 05, 1956  Situation: Patient enrolled in [facility] 30-day program. Visit completed with wife answered then patient by telephone.   Background:   Initial Transition Care Management Follow-up Telephone Call    Past Medical History:  Diagnosis Date   COPD (chronic obstructive pulmonary disease) (HCC)    mild   COVID-19 09/2019   Depression    Erectile dysfunction    Headache    daily, stress headaches   Knee pain, left    Seasonal allergies    Stroke (HCC)    Tobacco dependence     Assessment: Patient Reported Symptoms: Cognitive Cognitive Status: Alert and oriented to person, place, and time Cognitive/Intellectual Conditions Management [RPT]: None reported or documented in medical history or problem list, Other Other: Brain Tumor   Health Maintenance Behaviors: Exercise, Sleep adequate Healing Pattern: Unsure  Neurological Neurological Review of Symptoms: No symptoms reported Neurological Management Strategies: Activity Neurological Self-Management Outcome: 4 (good) Neurological Comment: Staying motivated  HEENT HEENT Symptoms Reported: No symptoms reported HEENT Comment: No drainage from ears    Cardiovascular Cardiovascular Symptoms Reported: No symptoms reported Does patient have uncontrolled Hypertension?: No Cardiovascular Self-Management Outcome: 4 (good)  Respiratory Respiratory Symptoms Reported: No symptoms reported Respiratory Conditions: COPD Respiratory Self-Management Outcome: 4 (good)  Endocrine Patient reports the following symptoms related to hypoglycemia or hyperglycemia : No symptoms reported Is patient diabetic?: No Endocrine Self-Management Outcome: 4 (good)  Gastrointestinal Gastrointestinal Symptoms Reported: No symptoms reported      Genitourinary Genitourinary Symptoms Reported: No symptoms reported    Integumentary Integumentary Symptoms  Reported: Incision Additional Integumentary Details: Clean - no problems Skin Self-Management Outcome: 4 (good)  Musculoskeletal Musculoskelatal Symptoms Reviewed: No symptoms reported Additional Musculoskeletal Details: stronger - weaning myself from devices Musculoskeletal Management Strategies: Medical device Musculoskeletal Self-Management Outcome: 4 (good) Falls in the past year?: No Number of falls in past year: 1 or less Was there an injury with Fall?: No Fall Risk Category Calculator: 0 Patient Fall Risk Level: Low Fall Risk    Psychosocial Psychosocial Symptoms Reported: No symptoms reported Additional Psychological Details: I guess a little impatient but not depressed or anxious Behavioral Health Self-Management Outcome: 4 (good)       There were no vitals filed for this visit.  Medications Reviewed Today     Reviewed by Eilleen Richerd GRADE, RN (Registered Nurse) on 02/27/24 at 1328  Med List Status: <None>   Medication Order Taking? Sig Documenting Provider Last Dose Status Informant  acetaminophen  (TYLENOL ) 325 MG tablet [id number]  Take 650 mg by mouth. [provider]  Active   albuterol  (VENTOLIN  HFA) 108 (90 Base) MCG/ACT inhaler [id number]  Inhale 2 puffs into the lungs every 4 (four) hours as needed for wheezing or shortness of breath. Edman Marsa PARAS, DO  Active   aspirin  EC 325 MG EC tablet [id number]  Take 1 tablet (325 mg total) by mouth daily. Maurice Sharlet GORMAN, PA-C  Active Spouse/Significant Other           Med Note JEANINE ROSELINE ONEIDA Stevan Mar 23, 2023  2:19 PM)    b complex vitamins capsule [id number]  Take 1 capsule by mouth daily. [provider]  Active Spouse/Significant Other  Cholecalciferol (VITAMIN D ) 50 MCG (2000 UT) tablet [id number]  Take 2,000 Units by mouth daily. [provider]  Active Spouse/Significant Other  dexamethasone  (DECADRON ) 4 MG tablet [id number]  Take 4 mg by mouth. [provider]  Active    ezetimibe  (ZETIA ) 10 MG tablet [id number]  Take 1 tablet (10 mg total) by mouth daily. [name], NP  Active   fluticasone  (FLONASE ) 50 MCG/ACT nasal spray [id number]  Place 2 sprays into both nostrils daily. Edman Marsa PARAS, DO  Active Spouse/Significant Other  midodrine  (PROAMATINE ) 5 MG tablet [id number]  TAKE 1 TABLET BY MOUTH 3 TIMES DAILY WITH MEALS. [name], NP  Active   Multiple Vitamin (MULTIVITAMIN WITH MINERALS) TABS tablet [id number]  Take 1 tablet by mouth daily. [provider]  Active Spouse/Significant Other  Multiple Vitamins-Minerals (IMMUNE SUPPORT VITAMIN C PO) [id number]  Take 1 tablet by mouth daily. [provider]  Active Spouse/Significant Other           Med Note LESLY, RICHERD GRADE   Fri Feb 10, 2024  1:37 PM) Patient states taking liquid MultiVitamins, wants to discuss meds at PCP visit  sildenafil  (REVATIO ) 20 MG tablet [id number]  TAKE 1 TO 5 TABLETS BY MOUTH ABOUT 30 MINUTES PRIOR TO SEX.START WITH 1 THEN INCREASE Edman Marsa PARAS, DO  Active Spouse/Significant Other  STIOLTO RESPIMAT  2.5-2.5 MCG/ACT AERS [id number]  INHALE 2 PUFFS EVERY DAY Edman Marsa PARAS, DO  Active   traMADol (ULTRAM) 50 MG tablet [id number]  Take 50 mg by mouth every 6 (six) hours as needed.  Patient not taking: Reported on 02/16/2024   [provider]  Active            RICHERD Fish, RN, BSN, CCM [facility], [facility] RN Care Manager Direct Dial: [phone]

## 2024-02-27 NOTE — Patient Instructions (Signed)
 Visit Information  Thank you for taking time to visit with me today. Please don't hesitate to contact me if I can be of assistance to you before our next scheduled telephone appointment.  Our next appointment is by telephone   Following is a copy of your care plan:   Goals Addressed             This Visit's Progress    [facility] Transitions of Care [facility]) Care Plan       Problems:  (Reviewed 02/27/24 Richerd Fish) Recent Hospitalization for treatment of s/p Craniotomy for left inferior cerebellar mass  No Hospital Follow Up Provider appointment Patient/wife to make follow up appointment with PCP, appointment made today by [facility] CMA for 02/17/24 at 11 am, patient and wife accepted appointment. Awaiting follow up with appointments with [facility] 02/16/24 and with Lynwood Crandall, NP  at [facility] 02/17/24 to discussed medications to be reconciled, did not wish to talk about all on his list 02/27/24 Medication list reviewed at appointment 02/17/24 Hospital Follow up with Lynwood Crandall, NP per patient, new medication placed  Goal:  Over the next 30 days, the patient will not experience hospital readmission  Interventions:  Transitions of Care: Doctor Visits  - discussed the importance of doctor visits Post discharge activity limitations prescribed by provider reviewed Post-op wound/incision care reviewed with patient/caregiver Reviewed Signs and symptoms of infection Patient to increase activity level has OP rehab orders has not made appointment until he see his providers this week. Patient to go to Oncology Radiology 02/28/24 at [place] for determination of Rad Onc treatment  Patient Self Care Activities: (Reviewed 02/27/24 Richerd Fish Attend all scheduled provider appointments Call pharmacy for medication refills 3-7 days in advance of running out of medications Call provider office for new concerns or questions  Participate in Transition of Care Program/Attend [facility] scheduled  calls Take medications as prescribed   Wants only to discuss with PCP medications (done on 02/17/24 per patient) Get Clearance from providers on increasing activity at home, to decrease boredom and what he can do to help wife out in the home.  Plan:  Telephone follow up appointment with care management team member scheduled for:  prefers monthly or so follow up The care management team will reach out to the patient again over the next 30 days. The patient will call PCP or Specialist as advised to changes in surgical site, headaches, s/s of infection.   * Patient will increase walking and was recommended by [facility] for follow up with Outpatient Rehab therapy, only wants at [facility] but wants to make own call.  Phone number provided.  [phone].  02/26/34 Patient states he will follow up about increasing this after his Rad/Onc appointment. He is progressing his activity on his own         Patient verbalizes understanding of instructions and care plan provided today and agrees to view in MyChart. Active MyChart status and patient understanding of how to access instructions and care plan via MyChart confirmed with patient.     The care management team will reach out to the patient again over the next 30 days.   Please call the care guide team at [phone] if you need to cancel or reschedule your appointment.   Please call the [place]  National Suicide Prevention Lifeline: 401-430-6035 or TTY: (250) 853-7092 TTY 208-436-9879) to talk to a trained counselor call 1-800-273-TALK (toll free, 24 hour hotline) if you are experiencing a Mental Health or Behavioral Health Crisis or need  someone to talk to.  Richerd Fish, RN, BSN, CCM [facility], [facility] RN Care Manager Direct Dial: [phone]

## 2024-02-28 ENCOUNTER — Ambulatory Visit
Admission: RE | Admit: 2024-02-28 | Discharge: 2024-02-28 | Disposition: A | Discharge status: Home / Self Care | Source: Ambulatory Visit | Attending: Radiation Oncology | Admitting: Radiation Oncology

## 2024-02-28 DIAGNOSIS — Z510 Encounter for antineoplastic radiation therapy: Secondary | ICD-10-CM | POA: Diagnosis present

## 2024-02-28 DIAGNOSIS — C3411 Malignant neoplasm of upper lobe, right bronchus or lung: Secondary | ICD-10-CM | POA: Insufficient documentation

## 2024-02-28 DIAGNOSIS — C7931 Secondary malignant neoplasm of brain: Secondary | ICD-10-CM | POA: Diagnosis not present

## 2024-03-01 ENCOUNTER — Other Ambulatory Visit: Payer: Self-pay | Admitting: *Deleted

## 2024-03-01 DIAGNOSIS — C7931 Secondary malignant neoplasm of brain: Secondary | ICD-10-CM | POA: Diagnosis not present

## 2024-03-02 ENCOUNTER — Telehealth: Payer: Self-pay | Admitting: Nurse Practitioner

## 2024-03-02 NOTE — Telephone Encounter (Signed)
 Left detailed voicemail for patient to call the office back.

## 2024-03-02 NOTE — Telephone Encounter (Signed)
 See the plan and duration of dexamethasone  with continued use send and CBG supplies to check glucose fasting daily every other day

## 2024-03-02 NOTE — Telephone Encounter (Signed)
-----   Message from [facility] sent at 02/17/2024 12:59 PM EDT ----- Regarding: Dexamethasone  See the plan and duration of dexamethasone  with continued use send and CBG supplies to check glucose fasting daily every other day

## 2024-03-03 ENCOUNTER — Encounter (HOSPITAL_COMMUNITY): Payer: Self-pay | Admitting: Interventional Radiology

## 2024-03-06 NOTE — Telephone Encounter (Signed)
 Left detailed voicemail for patient to call the office back.

## 2024-03-07 ENCOUNTER — Ambulatory Visit

## 2024-03-07 ENCOUNTER — Other Ambulatory Visit

## 2024-03-07 ENCOUNTER — Ambulatory Visit
Admission: RE | Admit: 2024-03-07 | Discharge: 2024-03-07 | Disposition: A | Discharge status: Home / Self Care | Source: Ambulatory Visit | Attending: Radiation Oncology | Admitting: Radiation Oncology

## 2024-03-07 ENCOUNTER — Ambulatory Visit: Admitting: Oncology

## 2024-03-07 DIAGNOSIS — C7931 Secondary malignant neoplasm of brain: Secondary | ICD-10-CM | POA: Insufficient documentation

## 2024-03-07 DIAGNOSIS — C3411 Malignant neoplasm of upper lobe, right bronchus or lung: Secondary | ICD-10-CM | POA: Insufficient documentation

## 2024-03-07 DIAGNOSIS — Z510 Encounter for antineoplastic radiation therapy: Secondary | ICD-10-CM | POA: Insufficient documentation

## 2024-03-07 DIAGNOSIS — Z720 Tobacco use: Secondary | ICD-10-CM | POA: Insufficient documentation

## 2024-03-12 ENCOUNTER — Other Ambulatory Visit: Payer: Self-pay

## 2024-03-12 ENCOUNTER — Ambulatory Visit
Admission: RE | Admit: 2024-03-12 | Discharge: 2024-03-12 | Disposition: A | Discharge status: Home / Self Care | Source: Ambulatory Visit | Attending: Radiation Oncology | Admitting: Radiation Oncology

## 2024-03-12 ENCOUNTER — Inpatient Hospital Stay

## 2024-03-12 DIAGNOSIS — C3411 Malignant neoplasm of upper lobe, right bronchus or lung: Secondary | ICD-10-CM | POA: Diagnosis not present

## 2024-03-12 DIAGNOSIS — Z510 Encounter for antineoplastic radiation therapy: Secondary | ICD-10-CM | POA: Diagnosis not present

## 2024-03-12 DIAGNOSIS — C7931 Secondary malignant neoplasm of brain: Secondary | ICD-10-CM

## 2024-03-12 DIAGNOSIS — Z720 Tobacco use: Secondary | ICD-10-CM | POA: Diagnosis not present

## 2024-03-12 DIAGNOSIS — Z87891 Personal history of nicotine dependence: Secondary | ICD-10-CM | POA: Diagnosis not present

## 2024-03-12 LAB — RAD ONC ARIA SESSION SUMMARY
Course Elapsed Days: 0
Plan Fractions Treated to Date: 1
Plan Prescribed Dose Per Fraction: 6 Gy
Plan Total Fractions Prescribed: 5
Plan Total Prescribed Dose: 30 Gy
Reference Point Dosage Given to Date: 6 Gy
Reference Point Session Dosage Given: 6 Gy
Session Number: 1

## 2024-03-12 LAB — CBC (CANCER CENTER ONLY)
HCT: 42.4 % (ref 39.0–52.0)
Hemoglobin: 14.2 g/dL (ref 13.0–17.0)
MCH: 32.3 pg (ref 26.0–34.0)
MCHC: 33.5 g/dL (ref 30.0–36.0)
MCV: 96.6 fL (ref 80.0–100.0)
Platelet Count: 194 K/uL (ref 150–400)
RBC: 4.39 MIL/uL (ref 4.22–5.81)
RDW: 16.3 % — ABNORMAL HIGH (ref 11.5–15.5)
WBC Count: 12.5 K/uL — ABNORMAL HIGH (ref 4.0–10.5)
nRBC: 0 % (ref 0.0–0.2)

## 2024-03-13 ENCOUNTER — Other Ambulatory Visit: Payer: Self-pay

## 2024-03-13 ENCOUNTER — Ambulatory Visit
Admission: RE | Admit: 2024-03-13 | Discharge: 2024-03-13 | Disposition: A | Discharge status: Home / Self Care | Source: Ambulatory Visit | Attending: Radiation Oncology | Admitting: Radiation Oncology

## 2024-03-13 DIAGNOSIS — Z510 Encounter for antineoplastic radiation therapy: Secondary | ICD-10-CM | POA: Diagnosis not present

## 2024-03-13 LAB — RAD ONC ARIA SESSION SUMMARY
Course Elapsed Days: 1
Plan Fractions Treated to Date: 2
Plan Prescribed Dose Per Fraction: 6 Gy
Plan Total Fractions Prescribed: 5
Plan Total Prescribed Dose: 30 Gy
Reference Point Dosage Given to Date: 12 Gy
Reference Point Session Dosage Given: 6 Gy
Session Number: 2

## 2024-03-14 ENCOUNTER — Other Ambulatory Visit: Payer: Self-pay

## 2024-03-14 ENCOUNTER — Ambulatory Visit
Admission: RE | Admit: 2024-03-14 | Discharge: 2024-03-14 | Disposition: A | Discharge status: Home / Self Care | Source: Ambulatory Visit | Attending: Radiation Oncology | Admitting: Radiation Oncology

## 2024-03-14 DIAGNOSIS — Z87891 Personal history of nicotine dependence: Secondary | ICD-10-CM | POA: Diagnosis not present

## 2024-03-14 DIAGNOSIS — Z720 Tobacco use: Secondary | ICD-10-CM | POA: Diagnosis not present

## 2024-03-14 DIAGNOSIS — C3411 Malignant neoplasm of upper lobe, right bronchus or lung: Secondary | ICD-10-CM | POA: Diagnosis not present

## 2024-03-14 DIAGNOSIS — C7931 Secondary malignant neoplasm of brain: Secondary | ICD-10-CM | POA: Diagnosis not present

## 2024-03-14 DIAGNOSIS — Z510 Encounter for antineoplastic radiation therapy: Secondary | ICD-10-CM | POA: Diagnosis not present

## 2024-03-14 LAB — RAD ONC ARIA SESSION SUMMARY
Course Elapsed Days: 2
Plan Fractions Treated to Date: 3
Plan Prescribed Dose Per Fraction: 6 Gy
Plan Total Fractions Prescribed: 5
Plan Total Prescribed Dose: 30 Gy
Reference Point Dosage Given to Date: 18 Gy
Reference Point Session Dosage Given: 6 Gy
Session Number: 3

## 2024-03-15 ENCOUNTER — Other Ambulatory Visit: Payer: Self-pay

## 2024-03-15 ENCOUNTER — Ambulatory Visit
Admission: RE | Admit: 2024-03-15 | Discharge: 2024-03-15 | Disposition: A | Discharge status: Home / Self Care | Source: Ambulatory Visit | Attending: Radiation Oncology | Admitting: Radiation Oncology

## 2024-03-15 DIAGNOSIS — Z510 Encounter for antineoplastic radiation therapy: Secondary | ICD-10-CM | POA: Diagnosis not present

## 2024-03-15 DIAGNOSIS — Z720 Tobacco use: Secondary | ICD-10-CM | POA: Diagnosis not present

## 2024-03-15 DIAGNOSIS — C7931 Secondary malignant neoplasm of brain: Secondary | ICD-10-CM | POA: Diagnosis not present

## 2024-03-15 DIAGNOSIS — C3411 Malignant neoplasm of upper lobe, right bronchus or lung: Secondary | ICD-10-CM | POA: Diagnosis not present

## 2024-03-15 DIAGNOSIS — Z87891 Personal history of nicotine dependence: Secondary | ICD-10-CM | POA: Diagnosis not present

## 2024-03-15 LAB — RAD ONC ARIA SESSION SUMMARY
Course Elapsed Days: 3
Plan Fractions Treated to Date: 4
Plan Prescribed Dose Per Fraction: 6 Gy
Plan Total Fractions Prescribed: 5
Plan Total Prescribed Dose: 30 Gy
Reference Point Dosage Given to Date: 24 Gy
Reference Point Session Dosage Given: 6 Gy
Session Number: 4

## 2024-03-16 ENCOUNTER — Telehealth: Payer: Self-pay

## 2024-03-16 ENCOUNTER — Ambulatory Visit
Admission: RE | Admit: 2024-03-16 | Discharge: 2024-03-16 | Disposition: A | Discharge status: Home / Self Care | Source: Ambulatory Visit | Attending: Radiation Oncology | Admitting: Radiation Oncology

## 2024-03-16 ENCOUNTER — Other Ambulatory Visit: Payer: Self-pay

## 2024-03-16 DIAGNOSIS — Z720 Tobacco use: Secondary | ICD-10-CM | POA: Diagnosis not present

## 2024-03-16 DIAGNOSIS — Z87891 Personal history of nicotine dependence: Secondary | ICD-10-CM | POA: Diagnosis not present

## 2024-03-16 DIAGNOSIS — Z510 Encounter for antineoplastic radiation therapy: Secondary | ICD-10-CM | POA: Diagnosis not present

## 2024-03-16 DIAGNOSIS — C3411 Malignant neoplasm of upper lobe, right bronchus or lung: Secondary | ICD-10-CM | POA: Diagnosis not present

## 2024-03-16 DIAGNOSIS — C7931 Secondary malignant neoplasm of brain: Secondary | ICD-10-CM | POA: Diagnosis not present

## 2024-03-16 LAB — RAD ONC ARIA SESSION SUMMARY
Course Elapsed Days: 4
Plan Fractions Treated to Date: 5
Plan Prescribed Dose Per Fraction: 6 Gy
Plan Total Fractions Prescribed: 5
Plan Total Prescribed Dose: 30 Gy
Reference Point Dosage Given to Date: 30 Gy
Reference Point Session Dosage Given: 6 Gy
Session Number: 5

## 2024-03-16 NOTE — Progress Notes (Signed)
 Complex Care Management Note  Care Guide Note 03/16/2024 Name: Ethan Fitzgerald MRN: [id number] DOB: 1955-12-01  Ethan Fitzgerald is a 68 y.o. year old male who sees Cable, Lynwood HERO, NP for primary care. I reached out to [name] by phone today to offer complex care management services.  [name] was given information about Complex Care Management services today including:   The Complex Care Management services include support from the care team which includes your Nurse Care Manager, Clinical Social Worker, or Pharmacist.  The Complex Care Management team is here to help remove barriers to the health concerns and goals most important to you. Complex Care Management services are voluntary, and the patient may decline or stop services at any time by request to their care team member.   Complex Care Management Consent Status: Patient agreed to services and verbal consent obtained.   Follow up plan:  Telephone appointment with complex care management team member scheduled for:  03/30/24 at 2:30 p.m.   Encounter Outcome:  Patient Scheduled  Dreama Lynwood [facility]  [facility], [facility] Care Management Assistant Direct Dial: [phone]  Fax: [phone]

## 2024-03-19 NOTE — Radiation Completion Notes (Signed)
 Patient Name: Ethan Fitzgerald, Ethan Fitzgerald MRN: [id number] Date of Birth: 03-Apr-1956 Referring Physician: LYNWOOD CRANDALL, M.D. Date of Service: 2024-03-19 Radiation Oncologist: Marcey Penton, M.D. [facility]                             RADIATION ONCOLOGY END OF TREATMENT NOTE     Diagnosis: C34.90 Malignant neoplasm of unspecified part of unspecified bronchus or lung; C79.31 Secondary malignant neoplasm of brain Staging on 2023-03-15: Adenocarcinoma of upper lobe of right lung (HCC) T=cT4, N=cN0, M=cM0 Intent: Palliative     HPI: Patient is a 68 year old male now at 8 months having completed concurrent chemoradiation therapy for stage IIIa (T4 N1 M0) adenocarcinoma the right lower lobe.SABRA  He recently presented to the ER with new onset dizziness and headaches and MRI showed a 3 cm lesion in the left cerebellum.  He underwent craniotomy and gross total resection for metastatic carcinoma consistent with lung primary.  Postop MRI scan showed again gross total resection of left inferior cerebellar mass with postoperative changes.  There was mild interval decrease in T2 FLAIR hyperintense signal in the left cerebellar hemisphere with mild decrease in mass effect on the fourth ventricle.  He is currently off steroids he specifically denies any change in visual fields or any focal neurologic deficits.  Is still somewhat unsteady on his feet although is not using a walker and is using a cane for assistance at this time.  His incision is well-healed.  He is currently on maintenance Durvalumab  which I believe will be held during his radiation treatments.      ==========DELIVERED PLANS==========  First Treatment Date: 2024-03-12 Last Treatment Date: 2024-03-16   Plan Name: Brain Site: Brain Technique: IMRT Mode: Photon Dose Per Fraction: 6 Gy Prescribed Dose (Delivered / Prescribed): 30 Gy / 30 Gy Prescribed Fxs (Delivered / Prescribed): 5 / 5     ==========ON TREATMENT VISIT  DATES========== 2024-03-13     ==========UPCOMING VISITS========== 04/18/2024 [facility] RAD ONCOLOGY FOLLOW UP 30 Penton Marcey, MD  04/16/2024 [facility] OFFICE VISIT CRANDALL LYNWOOD HERO, NP  03/30/2024 [facility] TELEPHONE CALL 60 Devra Lands, RN  03/20/2024 [facility] OFFICE VISIT Gerard Frederick, NP        ==========APPENDIX - ON TREATMENT VISIT NOTES==========   See weekly On Treatment Notes in Epic for details in the Media tab (listed as Progress notes on the On Treatment Visit Dates listed above).

## 2024-03-20 ENCOUNTER — Ambulatory Visit: Attending: Cardiology | Admitting: Cardiology

## 2024-03-20 ENCOUNTER — Encounter: Payer: Self-pay | Admitting: Cardiology

## 2024-03-20 VITALS — BP 118/80 | HR 81 | Ht 70.0 in | Wt 156.6 lb

## 2024-03-20 DIAGNOSIS — I6523 Occlusion and stenosis of bilateral carotid arteries: Secondary | ICD-10-CM

## 2024-03-20 DIAGNOSIS — I951 Orthostatic hypotension: Secondary | ICD-10-CM | POA: Diagnosis not present

## 2024-03-20 DIAGNOSIS — Z87898 Personal history of other specified conditions: Secondary | ICD-10-CM | POA: Diagnosis not present

## 2024-03-20 DIAGNOSIS — I739 Peripheral vascular disease, unspecified: Secondary | ICD-10-CM | POA: Diagnosis not present

## 2024-03-20 DIAGNOSIS — Z8673 Personal history of transient ischemic attack (TIA), and cerebral infarction without residual deficits: Secondary | ICD-10-CM | POA: Diagnosis not present

## 2024-03-20 MED ORDER — ASPIRIN 81 MG PO TBEC: 81.0000 mg | DELAYED_RELEASE_TABLET | Freq: Every day | ORAL | Status: DC

## 2024-03-20 NOTE — Progress Notes (Signed)
 Cardiology Office Note   Date:  03/20/2024  ID:  Ethan Fitzgerald, DOB May 23, 1956, MRN [id number] PCP: [name], NP  [facility] Providers Cardiologist:  Evalene Lunger, MD Cardiology APP:  Gerard Frederick, NP     History of Present Illness Ethan Fitzgerald is a 68 y.o. male with past medical history of tobacco abuse, peripheral arterial disease, bilateral ICA occlusion, history of stroke (03/2020), chronic back pain, emphysema, orthostatic hypotension, who is here today for follow-up.   Originally seen in clinic 04/21/2020 with Dr. Gollan after referral from his PCP for hypotension.  He had a sustained a stroke in July 2021 where he awoke with left-sided weakness EMS activated a code stroke and he was taken to [facility].  CT scan revealed a dense right M1 segment concerning for thrombus/LVO.  Subtle asymmetric hypodensity involving the right caudate and lentiform nuclei concerning for early ischemic changes.  No intracranial hemorrhage was noted.  CTA of the head revealed R MCA multiple R and L anterior circulation infarct status post tPA and IR with partial revascularization with the results sch. imaging confirmed occlusion to the bilateral ICA.  He had received tPA and underwent cerebral angiogram with partial revascularization of the right MCA dominant division with unsuccessful attempts at revascularization of the proximal right ICA.  He was then admitted to inpatient rehab and was able to be discharged home on 04/09/20.   He was last seen in clinic in April 2024 by Dr. Gollan.  Blood pressure been elevated.  Previously had been started on Florinef  for hypotension but had not been taking it in a consistent manner.  Had noted his weight was down 6 pounds from his prior therapeutics.  Continues to have orthostatic symptoms.  He was changed to midodrine  5 mg 3 times daily for orthostasis.  He reported taking his first dose in AM.  Reports blood pressure continues to be running low at home and  low in the a.m. 80 systolic.  He was encouraged to take his midodrine  3 times daily and may need to increase the dose up to 10 if he continues to have symptoms he can also mix with Florinef  0.1 mg daily.  He was encouraged to monitor his pressures at home.  He returns to clinic today stating for the cardiac perspective.  He denies any chest pain, shortness of breath, dyspnea on exertion, peripheral edema.  He has done well.  He stated he was recently hospitalized at [facility] 5/21 - 01/27/2024 for left cerebellar brain tumor, vasogenic cerebral edema clinically significant treated with Decadron .  He underwent guided suboccipital craniotomy for resection of the left cerebellar metastatic tumor.  There were no intraoperative complications.  Diet and activity were tolerated.  He was encouraged to be ambulatory in the hallways.  He was evaluated by physical therapy and Occupational Therapy and they were recommended he go home with outpatient PT.  According to his discharge summary he was to resume aspirin  on 02/01/2024 and the dose that he had listed was 81 mg daily.  Today he states he continues to work with therapy to regain his strength.  He has not required the use of midodrine  but on occasion for lower blood pressures.  And he typically when he does take him only take 2 doses per day versus the previously 3 times daily as prescribed.  States that he has not suffered from any undue side effects from his medications.  ROS: 10 point review of systems has been reviewed  and considered negative except ones been listed in the HPI  Studies Reviewed     2D echo 03/27/2020 1. Technically difficult study with limited views. Left ventricular  ejection fraction, by estimation, is grossly 55 to 60%. The left ventricle  has normal function. Left ventricular diastolic parameters were normal.   2. Right ventricular systolic function is normal. The right ventricular  size is normal. Tricuspid regurgitation signal is  inadequate for assessing  PA pressure.   3. The mitral valve is normal in structure. No evidence of mitral valve  regurgitation.   4. The aortic valve was not well visualized. Aortic valve regurgitation  is not visualized. No aortic stenosis is present.  Risk Assessment/Calculations           Physical Exam VS:  BP 118/80   Pulse 81   Ht 5' 10 (1.778 m)   Wt 156 lb 9.6 oz (71 kg)   SpO2 98%   BMI 22.47 kg/m        Wt Readings from Last 3 Encounters:  03/20/24 156 lb 9.6 oz (71 kg)  02/17/24 149 lb 9.6 oz (67.9 kg)  02/16/24 149 lb 14.4 oz (68 kg)    GEN: Well nourished, well developed in no acute distress NECK: No JVD; No carotid bruits CARDIAC: RRR, no murmurs, rubs, gallops RESPIRATORY:  Clear to auscultation without rales, wheezing or rhonchi  ABDOMEN: Soft, non-tender, non-distended EXTREMITIES:  No edema; No deformity   ASSESSMENT AND PLAN Orthostasis previously thought to be exacerbated by stroke and weight loss over several years.  Recently underwent brain tumor removal.  Blood pressure today was 118/80.  He is only taking midodrine  5 mg twice daily for blood pressure less than 120/80.  He is no longer taking Florinef .  He has been encouraged to maintain adequate hydration as well.  He also has been encouraged to continue to monitor pressures at home on a routine basis.  Peripheral arterial disease with right carotid occlusion.  He is taking ezetimibe  10 mg daily and aspirin  81 mg daily.  He has stopped smoking.  The goal of his LDL is less than 70.  2 months ago LDL was 111.  He is not interested in restarting statin medication at this time.  Ongoing management per his PCP.  History of stroke and recent resection of brain tumor recently admitted to [facility].  He continues to be followed by neurology and neurosurgery.  Mixed hyperlipidemia with an LDL of 111.  He is continued on ezetimibe  10 mg daily.  Declines additional medications at this time.   Ongoing management per PCP.       Dispo: Patient return to clinic to see MD/APP in 11 to 12 months or sooner if needed for further evaluation.  Signed, Charle Clear, NP

## 2024-03-20 NOTE — Patient Instructions (Signed)
 Medication Instructions:  Your physician recommends the following medication changes.  DECREASE: ASA to 81 mg daily  *If you need a refill on your cardiac medications before your next appointment, please call your pharmacy*  Lab Work: No labs ordered today  If you have labs (blood work) drawn today and your tests are completely normal, you will receive your results only by: MyChart Message (if you have MyChart) OR A paper copy in the mail If you have any lab test that is abnormal or we need to change your treatment, we will call you to review the results.  Testing/Procedures: No test ordered today   Follow-Up: At [facility], you and your health needs are our priority.  As part of our continuing mission to provide you with exceptional heart care, our providers are all part of one team.  This team includes your primary Cardiologist (physician) and Advanced Practice Providers or APPs (Physician Assistants and Nurse Practitioners) who all work together to provide you with the care you need, when you need it.  Your next appointment:   11 -12 month(s)  Provider:   Evalene Lunger, MD or Tylene Lunch, NP

## 2024-03-22 DIAGNOSIS — S8265XA Nondisplaced fracture of lateral malleolus of left fibula, initial encounter for closed fracture: Secondary | ICD-10-CM | POA: Diagnosis not present

## 2024-03-30 ENCOUNTER — Other Ambulatory Visit: Payer: Self-pay

## 2024-03-30 DIAGNOSIS — S8265XA Nondisplaced fracture of lateral malleolus of left fibula, initial encounter for closed fracture: Secondary | ICD-10-CM | POA: Diagnosis not present

## 2024-03-30 NOTE — Patient Instructions (Signed)
 Visit Information  Thank you for taking time to visit with me today. Please don't hesitate to contact me if I can be of assistance to you before our next scheduled appointment.  Our next appointment is by telephone on 04/13/2024 at 2:30 pm Please call the care guide team at [phone] if you need to cancel or reschedule your appointment.   Following is a copy of your care plan:   Goals Addressed             This Visit's Progress    [facility] RN Care Plan - HLD hx Stroke       Problems:  Chronic Disease Management support and education needs related to HLD (history CVA and brain tumor)  Goal: Over the next 90 days the Patient will attend all scheduled medical appointments: PCP and specialists as evidenced by no missed appointments        continue to work with RN Care Manager and/or Social Worker to address care management and care coordination needs related to HLD as evidenced by adherence to care management team scheduled appointments     take all medications exactly as prescribed and will call provider for medication related questions as evidenced by Patien treport compliance with all medications    verbalize basic understanding of HLD disease process and self health management plan as evidenced by patient able to verbalize lifestyle modifications to  bring Triglycerides and LDL to goal, specifically diet and exercise (once ankle healed), and risks of poor control  Interventions:   Hyperlipidemia Interventions: Medication review performed; medication list updated in electronic medical record.  Provider established cholesterol goals reviewed Provided HLD educational materials Reviewed importance of limiting foods high in cholesterol Screening for signs and symptoms of depression related to chronic disease state Assessed social determinant of health barriers   FALL Prevention: History of fall, orthostatic hypotension, currently in boot for fx ankle: Fall precautions discussed and info  sent to MyChart Education to notify PCP for any fall, ED if hits head or possibly hit head  S/P Craniotomy for metastatic tumor: Reviewed instructions from surgeon - symptoms to call/red flags for ED:  Reduced alertness New numbness or weakness Nausea or vomiting Pain that is progressively getting worse, and is not relieved by your pain medications or rest Inability to eat, drink fluids, or take medications Bleeding, redness, swelling, pain, or drainage from surgical incision Chills or flu-like symptoms Fever greater than 101.0 F (38.3 C) Problems with bowel or bladder functions Difficulty breathing or shortness of breath Warmth, tenderness, or swelling in your calf    Patient Self-Care Activities:  Attend all scheduled provider appointments Call pharmacy for medication refills 3-7 days in advance of running out of medications Call provider office for new concerns or questions  Take medications as prescribed   take all medications exactly as prescribed call doctor with any symptoms you believe are related to your medicine call doctor when you experience any new symptoms go to all doctor appointments as scheduled adhere to prescribed diet: Cardiac/DASH develop an exercise routine  Plan:  Telephone follow up appointment with care management team member scheduled for:  04/13/2024 at 2:30 pm             Please call the Suicide and Crisis Lifeline: 988 call the [place]  National Suicide Prevention Lifeline: 848-770-4522 or TTY: 9408639052 TTY 501-499-5589) to talk to a trained counselor call 1-800-273-TALK (toll free, 24 hour hotline) go to [facility] [address] [place] [phone]) call  911 if you are experiencing a Mental Health or Behavioral Health Crisis or need someone to talk to.  Patient verbalizes understanding of instructions and care plan provided today and agrees to view in MyChart. Active MyChart status and patient  understanding of how to access instructions and care plan via MyChart confirmed with patient.     Nestora Duos, MSN, RN [place]  [facility], [facility] RN Care Manager Direct Dial: [phone] Fax: [phone]  Orthostatic Hypotension Blood pressure is a measurement of how strongly, or weakly, your circulating blood is pressing against the walls of your arteries. Orthostatic hypotension is a drop in blood pressure that can happen when you change positions, such as when you go from lying down to standing. Arteries are blood vessels that carry blood from your heart throughout your body. When blood pressure is too low, you may not get enough blood to your brain or to the rest of your organs. Orthostatic hypotension can cause light-headedness, sweating, rapid heartbeat, blurred vision, and fainting. These symptoms require further investigation into the cause. What are the causes? Orthostatic hypotension can be caused by many things, including: Sudden changes in posture, such as standing up quickly after you have been sitting or lying down. Loss of blood (anemia) or loss of body fluids (dehydration). Heart problems, neurologic problems, or hormone problems. Pregnancy. Aging. The risk for this condition increases as you get older. Severe infection (sepsis). Certain medicines, such as medicines for high blood pressure or medicines that make the body lose excess fluids (diuretics). What are the signs or symptoms? Symptoms of this condition may include: Weakness, light-headedness, or dizziness. Sweating. Blurred vision. Tiredness (fatigue). Rapid heartbeat. Fainting, in severe cases. How is this diagnosed? This condition is diagnosed based on: Your symptoms and medical history. Your blood pressure measurements. Your health care provider will check your blood pressure when you are: Lying down. Sitting. Standing. A blood pressure reading is recorded as two  numbers, such as 120 over 80 (or 120/80). The first (top) number is called the systolic pressure. It is a measure of the pressure in your arteries as your heart beats. The second (bottom) number is called the diastolic pressure. It is a measure of the pressure in your arteries when your heart relaxes between beats. Blood pressure is measured in a unit called mmHg. Healthy blood pressure for most adults is 120/80 mmHg. Orthostatic hypotension is defined as a 20 mmHg drop in systolic pressure or a 10 mmHg drop in diastolic pressure within 3 minutes of standing. Other information or tests that may be used to diagnose orthostatic hypotension include: Your other vital signs, such as your heart rate and temperature. Blood tests. An electrocardiogram (ECG) or echocardiogram. A Holter monitor. This is a device you wear that records your heart rhythm continuously, usually for 24-48 hours. Tilt table test. For this test, you will be safely secured to a table that moves you from a lying position to an upright position. Your heart rhythm and blood pressure will be monitored during the test. How is this treated? This condition may be treated by: Changing your diet. This may involve eating more salt (sodium) or drinking more water. Changing the dosage of certain medicines you are taking that might be lowering your blood pressure. Correcting the underlying reason for the orthostatic hypotension. Wearing compression stockings. Taking medicines to raise your blood pressure. Avoiding actions that trigger symptoms. Follow these instructions at home: Medicines Take over-the-counter and prescription medicines only as told by your  health care provider. Follow instructions from your health care provider about changing the dosage of your current medicines, if this applies. Do not stop or adjust any of your medicines on your own. Eating and drinking  Drink enough fluid to keep your urine pale yellow. Eat extra  salt only as directed. Do not add extra salt to your diet unless advised by your health care provider. Eat frequent, small meals. Avoid standing up suddenly after eating. General instructions  Get up slowly from lying down or sitting positions. This gives your blood pressure a chance to adjust. Avoid hot showers and excessive heat as directed by your health care provider. Engage in regular physical activity as directed by your health care provider. If you have compression stockings, wear them as told. Keep all follow-up visits. This is important. Contact a health care provider if: You have a fever for more than 2-3 days. You feel more thirsty than usual. You feel dizzy or weak. Get help right away if: You have chest pain. You have a fast or irregular heartbeat. You become sweaty or feel light-headed. You feel short of breath. You faint. You have any symptoms of a stroke. BE FAST is an easy way to remember the main warning signs of a stroke: B - Balance. Signs are dizziness, sudden trouble walking, or loss of balance. E - Eyes. Signs are trouble seeing or a sudden change in vision. F - Face. Signs are sudden weakness or numbness of the face, or the face or eyelid drooping on one side. A - Arms. Signs are weakness or numbness in an arm. This happens suddenly and usually on one side of the body. S - Speech. Signs are sudden trouble speaking, slurred speech, or trouble understanding what people say. T - Time. Time to call emergency services. Write down what time symptoms started. You have other signs of a stroke, such as: A sudden, severe headache with no known cause. Nausea or vomiting. Seizure. These symptoms may represent a serious problem that is an emergency. Do not wait to see if the symptoms will go away. Get medical help right away. Call your local emergency services (911 in the [place] Do not drive yourself to the hospital. Summary Orthostatic hypotension is a sudden drop in  blood pressure. It can cause light-headedness, sweating, rapid heartbeat, blurred vision, and fainting. Orthostatic hypotension can be diagnosed by having your blood pressure taken while lying down, sitting, and then standing. Treatment may involve changing your diet, wearing compression stockings, sitting up slowly, adjusting your medicines, or correcting the underlying reason for the orthostatic hypotension. Get help right away if you have chest pain, a fast or irregular heartbeat, or symptoms of a stroke. This information is not intended to replace advice given to you by your health care provider. Make sure you discuss any questions you have with your health care provider. Document Revised: 11/06/2020 Document Reviewed: 11/06/2020 Elsevier Patient Education  2024 [employer]  Heart-Healthy Eating Plan Eating a healthy diet is important for the health of your heart. A heart-healthy eating plan includes: Eating less unhealthy fats. Eating more healthy fats. Eating less salt in your food. Salt is also called sodium. Making other changes in your diet. Talk with your doctor or a diet specialist (dietitian) to create an eating plan that is right for you. What is my plan? Your doctor may recommend an eating plan that includes: Total fat: ______% or less of total calories a day. Saturated fat: ______% or less of total  calories a day. Cholesterol: less than _________mg a day. Sodium: less than _________mg a day. What are tips for following this plan? Cooking Avoid frying your food. Try to bake, boil, grill, or broil it instead. You can also reduce fat by: Removing the skin from poultry. Removing all visible fats from meats. Steaming vegetables in water or broth. Meal planning  At meals, divide your plate into four equal parts: Fill one-half of your plate with vegetables and green salads. Fill one-fourth of your plate with whole grains. Fill one-fourth of your plate with lean protein  foods. Eat 2-4 cups of vegetables per day. One cup of vegetables is: 1 cup (91 g) broccoli or cauliflower florets. 2 medium carrots. 1 large bell pepper. 1 large sweet potato. 1 large tomato. 1 medium white potato. 2 cups (150 g) raw leafy greens. Eat 1-2 cups of fruit per day. One cup of fruit is: 1 small apple 1 large banana 1 cup (237 g) mixed fruit, 1 large orange,  cup (82 g) dried fruit, 1 cup (240 mL) 100% fruit juice. Eat more foods that have soluble fiber. These are apples, broccoli, carrots, beans, peas, and barley. Try to get 20-30 g of fiber per day. Eat 4-5 servings of nuts, legumes, and seeds per week: 1 serving of dried beans or legumes equals  cup (90 g) cooked. 1 serving of nuts is  oz (12 almonds, 24 pistachios, or 7 walnut halves). 1 serving of seeds equals  oz (8 g). General information Eat more home-cooked food. Eat less restaurant, buffet, and fast food. Limit or avoid alcohol. Limit foods that are high in starch and sugar. Avoid fried foods. Lose weight if you are overweight. Keep track of how much salt (sodium) you eat. This is important if you have high blood pressure. Ask your doctor to tell you more about this. Try to add vegetarian meals each week. Fats Choose healthy fats. These include olive oil and canola oil, flaxseeds, walnuts, almonds, and seeds. Eat more omega-3 fats. These include salmon, mackerel, sardines, tuna, flaxseed oil, and ground flaxseeds. Try to eat fish at least 2 times each week. Check food labels. Avoid foods with trans fats or high amounts of saturated fat. Limit saturated fats. These are often found in animal products, such as meats, butter, and cream. These are also found in plant foods, such as palm oil, palm kernel oil, and coconut oil. Avoid foods with partially hydrogenated oils in them. These have trans fats. Examples are stick margarine, some tub margarines, cookies, crackers, and other baked goods. What foods  should I eat? Fruits All fresh, canned (in natural juice), or frozen fruits. Vegetables Fresh or frozen vegetables (raw, steamed, roasted, or grilled). Green salads. Grains Most grains. Choose whole wheat and whole grains most of the time. [place] and pasta, including brown [place] and pastas made with whole wheat. Meats and other proteins Lean, well-trimmed beef, veal, pork, and lamb. Chicken and [place] without skin. All fish and shellfish. Wild duck, rabbit, pheasant, and venison. Egg whites or low-cholesterol egg substitutes. Dried beans, peas, lentils, and tofu. Seeds and most nuts. Dairy Low-fat or nonfat cheeses, including ricotta and mozzarella. Skim or 1% milk that is liquid, powdered, or evaporated. Buttermilk that is made with low-fat milk. Nonfat or low-fat yogurt. Fats and oils Non-hydrogenated (trans-free) margarines. Vegetable oils, including soybean, sesame, sunflower, olive, peanut, safflower, corn, canola, and cottonseed. Salad dressings or mayonnaise made with a vegetable oil. Beverages Mineral water. Coffee and tea. Diet carbonated beverages.  Sweets and desserts Sherbet, gelatin, and fruit ice. Small amounts of dark chocolate. Limit all sweets and desserts. Seasonings and condiments All seasonings and condiments. The items listed above may not be a complete list of foods and drinks you can eat. Contact a dietitian for more options. What foods should I avoid? Fruits Canned fruit in heavy syrup. Fruit in cream or butter sauce. Fried fruit. Limit coconut. Vegetables Vegetables cooked in cheese, cream, or butter sauce. Fried vegetables. Grains Breads that are made with saturated or trans fats, oils, or whole milk. Croissants. Sweet rolls. Donuts. High-fat crackers, such as cheese crackers. Meats and other proteins Fatty meats, such as hot dogs, ribs, sausage, bacon, rib-eye roast or steak. High-fat deli meats, such as salami and bologna. Caviar. Domestic duck and goose. Organ  meats, such as liver. Dairy Cream, sour cream, cream cheese, and creamed cottage cheese. Whole-milk cheeses. Whole or 2% milk that is liquid, evaporated, or condensed. Whole buttermilk. Cream sauce or high-fat cheese sauce. Yogurt that is made from whole milk. Fats and oils Meat fat, or shortening. Cocoa butter, hydrogenated oils, palm oil, coconut oil, palm kernel oil. Solid fats and shortenings, including bacon fat, salt pork, lard, and butter. Nondairy cream substitutes. Salad dressings with cheese or sour cream. Beverages Regular sodas and juice drinks with added sugar. Sweets and desserts Frosting. Pudding. Cookies. Cakes. Pies. Milk chocolate or white chocolate. Buttered syrups. Full-fat ice cream or ice cream drinks. The items listed above may not be a complete list of foods and drinks to avoid. Contact a dietitian for more information. Summary Heart-healthy meal planning includes eating less unhealthy fats, eating more healthy fats, and making other changes in your diet. Eat a balanced diet. This includes fruits and vegetables, low-fat or nonfat dairy, lean protein, nuts and legumes, whole grains, and heart-healthy oils and fats. This information is not intended to replace advice given to you by your health care provider. Make sure you discuss any questions you have with your health care provider. Document Revised: 09/28/2021 Document Reviewed: 09/28/2021 Elsevier Patient Education  2024 Else[employer] Inc. High Cholesterol  High cholesterol is a condition in which the blood has high levels of a white, waxy substance similar to fat (cholesterol). The liver makes all the cholesterol that the body needs. The human body needs small amounts of cholesterol to help build cells. A person gets extra or excess cholesterol from the food that he or she eats. The blood carries cholesterol from the liver to the rest of the body. If you have high cholesterol, deposits (plaques) may build up on the walls of  your arteries. Arteries are the blood vessels that carry blood away from your heart. These plaques make the arteries narrow and stiff. Cholesterol plaques increase your risk for heart attack and stroke. Work with your health care provider to keep your cholesterol levels in a healthy range. What increases the risk? The following factors may make you more likely to develop this condition: Eating foods that are high in animal fat (saturated fat) or cholesterol. Being overweight. Not getting enough exercise. A family history of high cholesterol (familial hypercholesterolemia). Use of tobacco products. Having diabetes. What are the signs or symptoms? In most cases, high cholesterol does not usually cause any symptoms. In severe cases, very high cholesterol levels can cause: Fatty bumps under the skin (xanthomas). A white or gray ring around the black center (pupil) of the eye. How is this diagnosed? This condition may be diagnosed based on the results of  a blood test. If you are older than 70 years of age, your health care provider may check your cholesterol levels every 4-6 years. You may be checked more often if you have high cholesterol or other risk factors for heart disease. The blood test for cholesterol measures: Bad cholesterol, or LDL cholesterol. This is the main type of cholesterol that causes heart disease. The desired level is less than 100 mg/dL (7.40 mmol/L). Good cholesterol, or HDL cholesterol. HDL helps protect against heart disease by cleaning the arteries and carrying the LDL to the liver for processing. The desired level for HDL is 60 mg/dL (8.44 mmol/L) or higher. Triglycerides. These are fats that your body can store or burn for energy. The desired level is less than 150 mg/dL (8.30 mmol/L). Total cholesterol. This measures the total amount of cholesterol in your blood and includes LDL, HDL, and triglycerides. The desired level is less than 200 mg/dL (4.82 mmol/L). How  is this treated? Treatment for high cholesterol starts with lifestyle changes, such as diet and exercise. Diet changes. You may be asked to eat foods that have more fiber and less saturated fats or added sugar. Lifestyle changes. These may include regular exercise, maintaining a healthy weight, and quitting use of tobacco products. Medicines. These are given when diet and lifestyle changes have not worked. You may be prescribed a statin medicine to help lower your cholesterol levels. Follow these instructions at home: Eating and drinking  Eat a healthy, balanced diet. This diet includes: Daily servings of a variety of fresh, frozen, or canned fruits and vegetables. Daily servings of whole grain foods that are rich in fiber. Foods that are low in saturated fats and trans fats. These include poultry and fish without skin, lean cuts of meat, and low-fat dairy products. A variety of fish, especially oily fish that contain omega-3 fatty acids. Aim to eat fish at least 2 times a week. Avoid foods and drinks that have added sugar. Use healthy cooking methods, such as roasting, grilling, broiling, baking, poaching, steaming, and stir-frying. Do not fry your food except for stir-frying. If you drink alcohol: Limit how much you have to: 0-1 drink a day for women who are not pregnant. 0-2 drinks a day for men. Know how much alcohol is in a drink. In the [place] one drink equals one 12 oz bottle of beer (355 mL), one 5 oz glass of wine (148 mL), or one 1 oz glass of hard liquor (44 mL). Lifestyle  Get regular exercise. Aim to exercise for a total of 150 minutes a week. Increase your activity level by doing activities such as gardening, walking, and taking the stairs. Do not use any products that contain nicotine or tobacco. These products include cigarettes, chewing tobacco, and vaping devices, such as e-cigarettes. If you need help quitting, ask your health care provider. General instructions Take  over-the-counter and prescription medicines only as told by your health care provider. Keep all follow-up visits. This is important. Where to find more information American Heart Association: www.heart.org National Heart, Lung, and Blood Institute: [employer].is Contact a health care provider if: You have trouble achieving or maintaining a healthy diet or weight. You are starting an exercise program. You are unable to stop smoking. Get help right away if: You have chest pain. You have trouble breathing. You have discomfort or pain in your jaw, neck, back, shoulder, or arm. You have any symptoms of a stroke. BE FAST is an easy way to remember the main warning  signs of a stroke: B - Balance. Signs are dizziness, sudden trouble walking, or loss of balance. E - Eyes. Signs are trouble seeing or a sudden change in vision. F - Face. Signs are sudden weakness or numbness of the face, or the face or eyelid drooping on one side. A - Arms. Signs are weakness or numbness in an arm. This happens suddenly and usually on one side of the body. S - Speech. Signs are sudden trouble speaking, slurred speech, or trouble understanding what people say. T - Time. Time to call emergency services. Write down what time symptoms started. You have other signs of a stroke, such as: A sudden, severe headache with no known cause. Nausea or vomiting. Seizure. These symptoms may represent a serious problem that is an emergency. Do not wait to see if the symptoms will go away. Get medical help right away. Call your local emergency services (911 in the [place] Do not drive yourself to the hospital. Summary Cholesterol plaques increase your risk for heart attack and stroke. Work with your health care provider to keep your cholesterol levels in a healthy range. Eat a healthy, balanced diet, get regular exercise, and maintain a healthy weight. Do not use any products that contain nicotine or tobacco. These products  include cigarettes, chewing tobacco, and vaping devices, such as e-cigarettes. Get help right away if you have any symptoms of a stroke. This information is not intended to replace advice given to you by your health care provider. Make sure you discuss any questions you have with your health care provider. Document Revised: 03/26/2022 Document Reviewed: 10/27/2020 Elsevier Patient Education  2024 [employer].

## 2024-03-30 NOTE — Patient Outreach (Signed)
 Complex Care Management   Visit Note  03/30/2024  Name:  Ethan Fitzgerald MRN: [id number] DOB: 14-Aug-1956  Situation: Referral received for Complex Care Management related to Hyperlipidemia, History of stroke, Craniotomy I obtained verbal consent from Patient.  Visit completed with patient  on the phone  Background:   Past Medical History:  Diagnosis Date   COPD (chronic obstructive pulmonary disease) (HCC)    mild   COVID-19 09/2019   Depression    Erectile dysfunction    Headache    daily, stress headaches   Knee pain, left    Seasonal allergies    Stroke (HCC)    Tobacco dependence     Assessment: Patient Reported Symptoms:  Cognitive Cognitive Status: Alert and oriented to person, place, and time, Insightful and able to interpret abstract concepts, Normal speech and language skills   Health Maintenance Behaviors: Annual physical exam, Exercise, Sleep adequate, Healthy diet Healing Pattern: Average Health Facilitated by: Healthy diet  Neurological Neurological Review of Symptoms: No symptoms reported Neurological Management Strategies: Medication therapy, Routine screening, Adequate rest Neurological Comment: Craniotomy - metastatic lung CA with brain tumor - reviewed symptoms from Surgeon to report immediately  HEENT HEENT Symptoms Reported: No symptoms reported HEENT Management Strategies: Routine screening HEENT Comment: will get vision check    Cardiovascular Cardiovascular Symptoms Reported: No symptoms reported Does patient have uncontrolled Hypertension?: No Weight: 158 lb (71.7 kg)  Respiratory Respiratory Symptoms Reported: No symptoms reported    Endocrine Endocrine Symptoms Reported: No symptoms reported Is patient diabetic?: No Endocrine Comment: family history, discussed low carb diet  Gastrointestinal Gastrointestinal Symptoms Reported: No symptoms reported      Genitourinary Genitourinary Symptoms Reported: No symptoms reported    Integumentary  Integumentary Symptoms Reported: No symptoms reported Additional Integumentary Details: incision closed on head Skin Management Strategies: Routine screening  Musculoskeletal Musculoskelatal Symptoms Reviewed: Back pain, Other Other Musculoskeletal Symptoms: uses cane steady gait Additional Musculoskeletal Details: back and knee pain history now Left ankle fx wearing boot Musculoskeletal Management Strategies: Medical device, Medication therapy, Routine screening, Adequate rest Falls in the past year?: Yes Number of falls in past year: 1 or less Was there an injury with Fall?: Yes Fall Risk Category Calculator: 2 Patient Fall Risk Level: Moderate Fall Risk Patient at Risk for Falls Due to: History of fall(s) Fall risk Follow up: Falls evaluation completed, Falls prevention discussed  Psychosocial Psychosocial Symptoms Reported: No symptoms reported Additional Psychological Details: was depressed when broke ankle but better since does not needs surgery Behavioral Management Strategies: Medication therapy Major Change/Loss/Stressor/Fears (CP): Medical condition, self Techniques to Cope with Loss/Stress/Change: Diversional activities Quality of Family Relationships: helpful, involved, supportive Do you feel physically threatened by others?: No      03/30/2024    3:13 PM  Depression screen PHQ 2/9  Decreased Interest 0  Down, Depressed, Hopeless 0  PHQ - 2 Score 0    Vitals:   03/30/24 1504  BP: 108/67    Medications Reviewed Today     Reviewed by Devra Lands, RN (Registered Nurse) on 03/30/24 at 1451  Med List Status: <None>   Medication Order Taking? Sig Documenting Provider Last Dose Status Informant  acetaminophen  (TYLENOL ) 325 MG tablet [id number] Yes Take 650 mg by mouth. [provider]  Active   albuterol  (VENTOLIN  HFA) 108 (90 Base) MCG/ACT inhaler [id number] Yes Inhale 2 puffs into the lungs every 4 (four) hours as needed for wheezing or shortness of breath.  Edman Marsa PARAS, DO  Active  aspirin  EC 81 MG tablet [id number] Yes Take 1 tablet (81 mg total) by mouth daily. Swallow whole.  Patient taking differently: Take 81 mg by mouth in the morning and at bedtime. Swallow whole. Patient states advised by ortho to take BID since ankle fx.   Gerard Frederick, NP  Active   b complex vitamins capsule [id number] Yes Take 1 capsule by mouth daily. [provider]  Active Spouse/Significant Other  Cholecalciferol (VITAMIN D ) 50 MCG (2000 UT) tablet [id number] Yes Take 2,000 Units by mouth daily. [provider]  Active Spouse/Significant Other  dexamethasone  (DECADRON ) 4 MG tablet [id number] Yes Take 4 mg by mouth.  Patient taking differently: Take 4 mg by mouth daily.   [provider]  Active   ezetimibe  (ZETIA ) 10 MG tablet [id number] Yes Take 1 tablet (10 mg total) by mouth daily. [name], NP  Active   fluticasone  (FLONASE ) 50 MCG/ACT nasal spray [id number] Yes Place 2 sprays into both nostrils daily.  Patient taking differently: Place 2 sprays into both nostrils as needed.   Edman Marsa PARAS, DO  Active Spouse/Significant Other  midodrine  (PROAMATINE ) 5 MG tablet [id number] Yes TAKE 1 TABLET BY MOUTH 3 TIMES DAILY WITH MEALS.  Patient taking differently: Patient takes in am if BP<120   [name], NP  Active   Multiple Vitamin (MULTIVITAMIN WITH MINERALS) TABS tablet [id number] Yes Take 1 tablet by mouth daily. [provider]  Active Spouse/Significant Other  Multiple Vitamins-Minerals (IMMUNE SUPPORT VITAMIN C PO) [id number] Yes Take 1 tablet by mouth daily. [provider]  Active Spouse/Significant Other           Med Note LESLY, RICHERD GRADE   Fri Feb 10, 2024  1:37 PM) Patient states taking liquid MultiVitamins, wants to discuss meds at PCP visit  sildenafil  (REVATIO ) 20 MG tablet [id number] Yes TAKE 1 TO 5 TABLETS BY MOUTH ABOUT 30 MINUTES PRIOR TO SEX.START WITH 1 THEN INCREASE  Edman Marsa PARAS, DO  Active Spouse/Significant Other  STIOLTO RESPIMAT  2.5-2.5 MCG/ACT AERS [id number] Yes INHALE 2 PUFFS EVERY DAY Edman Marsa PARAS, DO  Active   traMADol (ULTRAM) 50 MG tablet [id number] Yes Take 50 mg by mouth every 6 (six) hours as needed. [provider]  Active             Recommendation:   PCP Follow-up Specialty provider follow-up all specialists Continue Current Plan of Care  Follow Up Plan:   Telephone follow-up 2 Draylen Lobue  Nestora Duos, MSN, RN [facility]  [facility], [facility] RN Care Manager Direct Dial: [phone] Fax: [phone]

## 2024-04-13 ENCOUNTER — Telehealth: Payer: Self-pay

## 2024-04-13 DIAGNOSIS — S8265XA Nondisplaced fracture of lateral malleolus of left fibula, initial encounter for closed fracture: Secondary | ICD-10-CM | POA: Diagnosis not present

## 2024-04-13 NOTE — Patient Instructions (Signed)
 Ethan Fitzgerald - I am sorry I was unable to reach you today for our scheduled appointment. I work with Wendee, Lynwood HERO, NP and am calling to support your healthcare needs. Please contact me at [phone] at your earliest convenience. I look forward to speaking with you soon.   Thank you,  Nestora Duos, MSN, RN [facility]  [facility], [facility] Health RN Care Manager Direct Dial: [phone] Fax: [phone]

## 2024-04-16 ENCOUNTER — Ambulatory Visit (INDEPENDENT_AMBULATORY_CARE_PROVIDER_SITE_OTHER): Admitting: Nurse Practitioner

## 2024-04-16 VITALS — BP 100/62 | HR 99 | Temp 97.6°F | Ht 70.0 in | Wt 158.6 lb

## 2024-04-16 DIAGNOSIS — I6529 Occlusion and stenosis of unspecified carotid artery: Secondary | ICD-10-CM

## 2024-04-16 DIAGNOSIS — R7303 Prediabetes: Secondary | ICD-10-CM | POA: Diagnosis not present

## 2024-04-16 DIAGNOSIS — E785 Hyperlipidemia, unspecified: Secondary | ICD-10-CM

## 2024-04-16 DIAGNOSIS — I6601 Occlusion and stenosis of right middle cerebral artery: Secondary | ICD-10-CM

## 2024-04-16 DIAGNOSIS — I739 Peripheral vascular disease, unspecified: Secondary | ICD-10-CM | POA: Diagnosis not present

## 2024-04-16 LAB — LIPID PANEL
Cholesterol: 184 mg/dL (ref 0–200)
HDL: 37.3 mg/dL — ABNORMAL LOW (ref >39.00)
LDL Cholesterol: 97 mg/dL (ref 0–99)
NonHDL: 146.81
Total CHOL/HDL Ratio: 5
Triglycerides: 247 mg/dL — ABNORMAL HIGH (ref 0.0–149.0)
VLDL: 49.4 mg/dL — ABNORMAL HIGH (ref 0.0–40.0)

## 2024-04-16 LAB — HEMOGLOBIN A1C: Hgb A1c MFr Bld: 6.4 % (ref 4.6–6.5)

## 2024-04-16 NOTE — Progress Notes (Signed)
 Established Patient Office Visit  Subjective   [name], male    DOB: 02/17/1956  Age: 68 y.o. MRN: [id number]  Chief Complaint  Patient presents with   Follow-up    HPI  Cancer: Patient was last seen by me on 02/17/2024 for hospital follow-up.  At that juncture patient was diagnosed with a brain tumor secondary to lung cancer.  He did go to a suboccipital craniotomy for the left cerebellar mass.  He was placed on dexamethasone  unsure of the length of therapy.  The patient was prediabetic with A1c of 6.0. states that he has stopped taking the decadron . States that he has not have any more headaches. He has had 5  HLD: Patient was reticent to try statin therapy as his father passed away due to complication of the medication per his report.  Patient was on ezetimibe .  This was refilled   States that he did break his ankle 5 weeks ago    Review of Systems  Constitutional:  Negative for chills and fever.  Respiratory:  Negative for shortness of breath.   Cardiovascular:  Negative for chest pain.  Gastrointestinal:        BM every other day   Neurological:  Negative for dizziness, tingling and headaches.      Objective:     BP 100/62   Pulse 99   Temp 97.6 F (36.4 C) (Oral)   Ht 5' 10 (1.778 m)   Wt 158 lb 9.6 oz (71.9 kg)   SpO2 95%   BMI 22.76 kg/m  BP Readings from Last 3 Encounters:  04/16/24 100/62  03/30/24 108/67  03/20/24 118/80   Wt Readings from Last 3 Encounters:  04/16/24 158 lb 9.6 oz (71.9 kg)  03/30/24 158 lb (71.7 kg)  03/20/24 156 lb 9.6 oz (71 kg)   SpO2 Readings from Last 3 Encounters:  04/16/24 95%  03/20/24 98%  02/17/24 99%      Physical Exam Vitals and nursing note reviewed.  Constitutional:      Appearance: Normal appearance.  Cardiovascular:     Rate and Rhythm: Normal rate and regular rhythm.     Heart sounds: Normal heart sounds.  Pulmonary:     Effort: Pulmonary effort is normal.     Comments: Diminished  in the lower lobes posteriorly  Neurological:     Mental Status: He is alert.      No results found for any visits on 04/16/24.    The ASCVD Risk score (Arnett DK, et al., 2019) failed to calculate for the following reasons:   Risk score cannot be calculated because patient has a medical history suggesting prior/existing ASCVD    Assessment & Plan:   Problem List Items Addressed This Visit       Cardiovascular and Mediastinum   Middle cerebral artery embolism, right - Primary   History of the same does not want to statins currently on ezetimibe  pending lipid panel today we had discussion of wanting LDL below 70      Carotid stenosis   PAD (peripheral artery disease) (HCC)   History of the same patient does not want any statin therapy.  Currently on ezetimibe  pending lipid panel today        Other   Hyperlipidemia   Currently on ezetimibe  10 mg daily pending lipid panel today      Relevant Orders   Lipid panel   Prediabetes   History of the same.  Last A1c 6.0%.  Patient did a course of dexamethasone  status post brain surgery will check A1c today.      Relevant Orders   Hemoglobin A1c    Return in about 6 months (around 10/17/2024) for BP recheck, COPD.    Adina Crandall, NP

## 2024-04-16 NOTE — Assessment & Plan Note (Signed)
 History of the same patient does not want any statin therapy.  Currently on ezetimibe  pending lipid panel today

## 2024-04-16 NOTE — Assessment & Plan Note (Signed)
 Currently on ezetimibe  10 mg daily pending lipid panel today

## 2024-04-16 NOTE — Patient Instructions (Signed)
 Nice to see you today  I will be I touch with the labs once I have them Follow up with me in 6 months, sooner if you need me

## 2024-04-16 NOTE — Assessment & Plan Note (Signed)
 History of the same does not want to statins currently on ezetimibe  pending lipid panel today we had discussion of wanting LDL below 70

## 2024-04-16 NOTE — Assessment & Plan Note (Signed)
 History of the same.  Last A1c 6.0%.  Patient did a course of dexamethasone  status post brain surgery will check A1c today.

## 2024-04-17 ENCOUNTER — Ambulatory Visit: Payer: Self-pay | Admitting: Nurse Practitioner

## 2024-04-18 ENCOUNTER — Encounter: Payer: Self-pay | Admitting: Radiation Oncology

## 2024-04-18 ENCOUNTER — Ambulatory Visit
Admission: RE | Admit: 2024-04-18 | Discharge: 2024-04-18 | Disposition: A | Discharge status: Home / Self Care | Payer: Medicare HMO | Source: Ambulatory Visit | Attending: Radiation Oncology | Admitting: Radiation Oncology

## 2024-04-18 ENCOUNTER — Telehealth: Payer: Self-pay | Admitting: *Deleted

## 2024-04-18 ENCOUNTER — Telehealth: Payer: Self-pay

## 2024-04-18 ENCOUNTER — Other Ambulatory Visit: Payer: Self-pay | Admitting: *Deleted

## 2024-04-18 VITALS — BP 112/81 | HR 98 | Temp 97.5°F | Resp 16

## 2024-04-18 DIAGNOSIS — C3411 Malignant neoplasm of upper lobe, right bronchus or lung: Secondary | ICD-10-CM | POA: Insufficient documentation

## 2024-04-18 DIAGNOSIS — C7931 Secondary malignant neoplasm of brain: Secondary | ICD-10-CM | POA: Insufficient documentation

## 2024-04-18 NOTE — Telephone Encounter (Signed)
 Secure chat received from radiation oncology this morning that patient was requesting follow up with Dr. Jacobo at this time. Per last MD note, patient concerned about finances and was seeking financial aid for durvalumab  and would reach out when ready to schedule follow up.  I reached out to the patient to see if he would like to schedule follow up with Dr. Jacobo to check in or if if he is ready to restart durvalumab .  Ethan Fitzgerald, SW also reached out to patient today regarding financial assistance. Will await return call from patient and proceed with scheduling as patient desires.

## 2024-04-18 NOTE — Telephone Encounter (Signed)
 Clinical Social Work received referral from [employer] regarding financial strain.  CSW attempted to contact patient by phone.  Left voicemail with contact information and request for return call.

## 2024-04-18 NOTE — Progress Notes (Signed)
 Radiation Oncology Follow up Note  Name: Ethan Fitzgerald   Date:   04/18/2024 MRN:  [id number] DOB: 30-May-1956    This 68 y.o. male presents to the clinic today for 1 month follow-up status post radiation therapy for solitary brain metastasis status post surgical resection at [facility]..  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  He is seen today in routine follow-up is doing well.  He specifically denies any change in his neurologic status any headaches change in visual fields or focal neurologic deficits.  His breathing is also fine specifically [place] cough hemoptysis chest tightness or any changeHis pulmonary status.  He has not had any follow-up imaging..  Patient has received I believe 16 cycles of maintenance Durvalumab  which she tolerated well.  That is currently on hold secondary to his ability to afford this medication.  COMPLICATIONS OF TREATMENT: none  FOLLOW UP COMPLIANCE: keeps appointments   PHYSICAL EXAM:  BP 112/81   Pulse 98   Temp (!) 97.5 F (36.4 C) (Tympanic)   Resp 16  Crude visual fields are within normal range motor sensory and DTR levels are equal and symmetric in upper and lower extremities.  Proprioception is intact.  Well-developed well-nourished patient in NAD. HEENT reveals PERLA, EOMI, discs not visualized.  Oral cavity is clear. No oral mucosal lesions are identified. Neck is clear without evidence of cervical or supraclavicular adenopathy. Lungs are clear to A&P. Cardiac examination is essentially unremarkable with regular rate and rhythm without murmur rub or  thrill. Abdomen is benign with no organomegaly or masses noted. Motor sensory and DTR levels are equal and symmetric in the upper and lower extremities. Cranial nerves II through XII are grossly intact. Proprioception is intact. No peripheral adenopathy or edema is identified. No motor or sensory levels are noted. Crude visual fields are within normal range.  RADIOLOGY RESULTS: CT scans of chest as well as MRI of brain ordered for November  PLAN: Present time patient is stable.  I have asked to see him back in November with repeat CT scans of his chest as well as MRI of his brain.  I am also making him a follow-up appoint with Dr. Jacobo for contemplation of further treatments in light of his problems with his insurance and costs of maintenance immunotherapy.  Patient and wife both comprehend the recommendations well.  I would like to take this opportunity to thank you for allowing me to participate in the care of your patient.SABRA Marcey Penton, MD

## 2024-04-19 ENCOUNTER — Ambulatory Visit: Admitting: Radiation Oncology

## 2024-04-19 ENCOUNTER — Telehealth: Payer: Self-pay

## 2024-04-19 NOTE — Telephone Encounter (Signed)
 Clinical Social Work was referred by Samule Bertrand regarding financial strain.  Patient called back and left a message stating he needed assistance with a hospital bill.  CSW  left voicemail with the contact information for Patient Customer Service.

## 2024-04-20 ENCOUNTER — Telehealth: Payer: Self-pay | Admitting: Nurse Practitioner

## 2024-04-20 NOTE — Telephone Encounter (Unsigned)
 Copied from CRM [id number]. Topic: Clinical - Medication Refill >> Apr 20, 2024 11:26 AM Chasity T wrote: Medication: albuterol  (VENTOLIN  HFA) 108 (90 Base) MCG/ACT inhaler   Has the patient contacted their pharmacy? No   This is the patient's preferred pharmacy:  [facility] - [place], [place] - [address] [place] [address] Phone: [phone] Fax: [phone]   Is this the correct pharmacy for this prescription? Yes If no, delete pharmacy and type the correct one.   Has the prescription been filled recently? No  Is the patient out of the medication? Yes  Has the patient been seen for an appointment in the last year OR does the patient have an upcoming appointment? Yes  Can we respond through MyChart? Yes  Agent: Please be advised that Rx refills may take up to 3 business days. We ask that you follow-up with your pharmacy.

## 2024-05-02 DIAGNOSIS — S8265XA Nondisplaced fracture of lateral malleolus of left fibula, initial encounter for closed fracture: Secondary | ICD-10-CM | POA: Diagnosis not present

## 2024-05-31 ENCOUNTER — Telehealth: Payer: Self-pay

## 2024-05-31 NOTE — Patient Outreach (Signed)
 Complex Care Management Care Guide Note  05/31/2024 Name: Ethan Fitzgerald MRN: [id number] DOB: 04-27-1956  Ethan Fitzgerald is a 68 y.o. year old male who is a primary care patient of [name], NP and is actively engaged with the care management team. I reached out to [name] by phone today to assist with re-scheduling  with the RN Case Manager.  Follow up plan: Telephone appointment with complex care management team member scheduled for:  07/04/24  Shereen Gin [facility]  Population Health VBCI Assistant Direct Dial: [phone]  Fax: [phone] Website: [email or web address]

## 2024-06-15 ENCOUNTER — Other Ambulatory Visit: Payer: Self-pay

## 2024-06-15 ENCOUNTER — Emergency Department

## 2024-06-15 ENCOUNTER — Emergency Department: Admission: EM | Admit: 2024-06-15 | Discharge: 2024-06-15 | Disposition: A | Discharge status: Home / Self Care

## 2024-06-15 DIAGNOSIS — J439 Emphysema, unspecified: Secondary | ICD-10-CM | POA: Diagnosis not present

## 2024-06-15 DIAGNOSIS — Z7982 Long term (current) use of aspirin: Secondary | ICD-10-CM | POA: Diagnosis not present

## 2024-06-15 DIAGNOSIS — S0081XA Abrasion of other part of head, initial encounter: Secondary | ICD-10-CM | POA: Insufficient documentation

## 2024-06-15 DIAGNOSIS — R900 Intracranial space-occupying lesion found on diagnostic imaging of central nervous system: Secondary | ICD-10-CM

## 2024-06-15 DIAGNOSIS — Z85841 Personal history of malignant neoplasm of brain: Secondary | ICD-10-CM | POA: Insufficient documentation

## 2024-06-15 DIAGNOSIS — R9431 Abnormal electrocardiogram [ECG] [EKG]: Secondary | ICD-10-CM | POA: Diagnosis not present

## 2024-06-15 DIAGNOSIS — G939 Disorder of brain, unspecified: Secondary | ICD-10-CM | POA: Diagnosis not present

## 2024-06-15 DIAGNOSIS — Z85118 Personal history of other malignant neoplasm of bronchus and lung: Secondary | ICD-10-CM | POA: Diagnosis not present

## 2024-06-15 DIAGNOSIS — Z8603 Personal history of neoplasm of uncertain behavior: Secondary | ICD-10-CM | POA: Diagnosis not present

## 2024-06-15 DIAGNOSIS — M5030 Other cervical disc degeneration, unspecified cervical region: Secondary | ICD-10-CM | POA: Diagnosis not present

## 2024-06-15 DIAGNOSIS — G936 Cerebral edema: Secondary | ICD-10-CM | POA: Diagnosis not present

## 2024-06-15 DIAGNOSIS — M4802 Spinal stenosis, cervical region: Secondary | ICD-10-CM | POA: Diagnosis not present

## 2024-06-15 DIAGNOSIS — E871 Hypo-osmolality and hyponatremia: Secondary | ICD-10-CM | POA: Insufficient documentation

## 2024-06-15 DIAGNOSIS — M25512 Pain in left shoulder: Secondary | ICD-10-CM | POA: Insufficient documentation

## 2024-06-15 DIAGNOSIS — G9389 Other specified disorders of brain: Secondary | ICD-10-CM | POA: Diagnosis not present

## 2024-06-15 DIAGNOSIS — R55 Syncope and collapse: Secondary | ICD-10-CM | POA: Insufficient documentation

## 2024-06-15 DIAGNOSIS — W19XXXA Unspecified fall, initial encounter: Secondary | ICD-10-CM | POA: Insufficient documentation

## 2024-06-15 DIAGNOSIS — S0990XA Unspecified injury of head, initial encounter: Secondary | ICD-10-CM | POA: Diagnosis present

## 2024-06-15 DIAGNOSIS — J449 Chronic obstructive pulmonary disease, unspecified: Secondary | ICD-10-CM | POA: Insufficient documentation

## 2024-06-15 DIAGNOSIS — Z9889 Other specified postprocedural states: Secondary | ICD-10-CM | POA: Diagnosis not present

## 2024-06-15 DIAGNOSIS — R519 Headache, unspecified: Secondary | ICD-10-CM | POA: Diagnosis not present

## 2024-06-15 DIAGNOSIS — M19012 Primary osteoarthritis, left shoulder: Secondary | ICD-10-CM | POA: Diagnosis not present

## 2024-06-15 DIAGNOSIS — R220 Localized swelling, mass and lump, head: Secondary | ICD-10-CM | POA: Diagnosis not present

## 2024-06-15 DIAGNOSIS — Z043 Encounter for examination and observation following other accident: Secondary | ICD-10-CM | POA: Diagnosis not present

## 2024-06-15 LAB — CBC
HCT: 41.9 % (ref 39.0–52.0)
Hemoglobin: 14.1 g/dL (ref 13.0–17.0)
MCH: 32.8 pg (ref 26.0–34.0)
MCHC: 33.7 g/dL (ref 30.0–36.0)
MCV: 97.4 fL (ref 80.0–100.0)
Platelets: 242 K/uL (ref 150–400)
RBC: 4.3 MIL/uL (ref 4.22–5.81)
RDW: 12.8 % (ref 11.5–15.5)
WBC: 8.1 K/uL (ref 4.0–10.5)
nRBC: 0 % (ref 0.0–0.2)

## 2024-06-15 LAB — BASIC METABOLIC PANEL WITH GFR
Anion gap: 12 (ref 5–15)
BUN: 25 mg/dL — ABNORMAL HIGH (ref 8–23)
CO2: 20 mmol/L — ABNORMAL LOW (ref 22–32)
Calcium: 10 mg/dL (ref 8.9–10.3)
Chloride: 102 mmol/L (ref 98–111)
Creatinine, Ser: 1.17 mg/dL (ref 0.61–1.24)
GFR, Estimated: >60 mL/min (ref >60)
Glucose, Bld: 96 mg/dL (ref 70–99)
Potassium: 3.5 mmol/L (ref 3.5–5.1)
Sodium: 134 mmol/L — ABNORMAL LOW (ref 135–145)

## 2024-06-15 LAB — PROTIME-INR
INR: 1 (ref 0.8–1.2)
Prothrombin Time: 14 s (ref 11.4–15.2)

## 2024-06-15 MED ORDER — SODIUM CHLORIDE 0.9 % IV BOLUS
500.0000 mL | Freq: Once | INTRAVENOUS | Status: AC
  Administered 2024-06-15: 500 mL via INTRAVENOUS

## 2024-06-15 MED ORDER — DEXAMETHASONE 2 MG PO TABS: 2.0000 mg | ORAL_TABLET | Freq: Two times a day (BID) | ORAL | 0 refills | Status: AC

## 2024-06-15 MED ORDER — DEXAMETHASONE SOD PHOSPHATE PF 10 MG/ML IJ SOLN
10.0000 mg | Freq: Once | INTRAMUSCULAR | Status: AC
  Administered 2024-06-15: 10 mg via INTRAVENOUS

## 2024-06-15 MED ORDER — ACETAMINOPHEN 500 MG PO TABS
1000.0000 mg | ORAL_TABLET | Freq: Once | ORAL | Status: AC
  Administered 2024-06-15: 1000 mg via ORAL
  Filled 2024-06-15: qty 2

## 2024-06-15 MED ORDER — GADOBUTROL 1 MMOL/ML IV SOLN
7.0000 mL | Freq: Once | INTRAVENOUS | Status: AC | PRN
  Administered 2024-06-15: 7 mL via INTRAVENOUS

## 2024-06-15 MED ORDER — DEXAMETHASONE 4 MG PO TABS: 10.0000 mg | ORAL_TABLET | Freq: Once | ORAL | Status: DC

## 2024-06-15 MED ORDER — ACETAMINOPHEN 500 MG PO TABS: 1000.0000 mg | ORAL_TABLET | Freq: Four times a day (QID) | ORAL | 2 refills | Status: DC | PRN

## 2024-06-15 NOTE — ED Triage Notes (Signed)
 Pt to ED via POV from home. Pt reports stood up to fast and felt dizzy and fell. Pt reports head pain and left shoulder pain. Pt also reports HA intermittently x3 wks. Pt denies blood thinner.   Pt had brain cancer with tumor removal a few months ago from metastasized lung cancer and hx CVA.

## 2024-06-15 NOTE — ED Provider Notes (Signed)
 [facility] Provider Note    Event Date/Time   First MD Initiated Contact with Patient 06/15/24 1715     (approximate)   History   Fall (/)  Pt to ED via POV from home. Pt reports stood up to fast and felt dizzy and fell. Pt reports head pain and left shoulder pain. Pt also reports HA intermittently x3 wks. Pt denies blood thinner.   Pt had brain cancer with tumor removal a few months ago from metastasized lung cancer and hx CVA.     HPI Ethan Fitzgerald is a 68 y.o. male PMH COPD, hyperlipidemia, prior subarachnoid hemorrhage, lung cancer, and deficiency anem, PAD, orthostatic hypotension, stage IIIB adenocarcinoma right upper lung, prior CVA with residual left facial droop and deficit in left hand dexterity presents for evaluation after a fall - Patient states he stood up quickly, became lightheaded/dizzy, fell.  Denies LOC.  Notes this happens frequently for him and that he has a history of orthostatic hypotension, does not normally stand up so quickly but forgot to slow himself today.  Tolerating good p.o. intake.  No preceding chest pain, abdominal pain.  No recent infectious symptoms, no urinary symptoms. - Does have a history of low blood pressure, notes it is often in the mid 80s to upper 90s systolics in the morning.  He is on midodrine .  Is taking all of his medications as prescribed. - Takes aspirin , no blood thinners. - Does note that he has had some mild intermittent headache over the past 3 weeks or so.  No acute severe headaches.  No new focal deficits apart from his baseline left facial droop and left hand weakness. - Accompanied by his wife who provides collateral. - Does note some mild left shoulder pain, notes he fell on his shoulder   Per chart review, last admission information available was 5/21-5/23/2025 for subsegmental craniotomy for resection of known left cerebellar tumor.     Physical Exam   Triage Vital Signs: ED Triage Vitals   Encounter Vitals Group     BP 06/15/24 1711 107/80     Girls Systolic BP Percentile --      Girls Diastolic BP Percentile --      Boys Systolic BP Percentile --      Boys Diastolic BP Percentile --      Pulse Rate 06/15/24 1711 96     Resp 06/15/24 1711 18     Temp 06/15/24 1711 98 F (36.7 C)     Temp Source 06/15/24 1711 Oral     SpO2 06/15/24 1711 98 %     Weight --      Height --      Head Circumference --      Peak Flow --      Pain Score 06/15/24 1708 4     Pain Loc --      Pain Education --      Exclude from Growth Chart --     Most recent vital signs: Vitals:   06/15/24 2100 06/15/24 2130  BP: (!) 142/92 (!) 125/103  Pulse: 83 89  Resp: 17 (!) 21  Temp:    SpO2: (!) 87% 100%     General: Awake, no distress.  HEENT: Normocephalic, abrasions over left cheek and forehead.  No intraoral trauma appreciated.  No midline neck tenderness. CV:  Good peripheral perfusion. RRR, RP 2+ Resp:  Normal effort. CTAB Abd:  No distention. Nontender to deep palpation throughout Neuro:  Aox4,  CN II-XII intact though mild left facial droop appreciated, FNF wnl, finger slowed on left upper extremity (normal on R), 5/5 strength in bilateral finger extension/grip, arm flexion/extension, EHL/FHL. BUE AG 10+ sec no drift, BLE AG 5+ sec no drift. SILT.  Other:  Some tenderness to palpation over left anterior shoulder/proximal humerus, no deformity appreciated, left upper extremity neurovascularly intact.   ED Results / Procedures / Treatments   Labs (all labs ordered are listed, but only abnormal results are displayed) Labs Reviewed  BASIC METABOLIC PANEL WITH GFR - Abnormal; Notable for the following components:      Result Value   Sodium 134 (*)    CO2 20 (*)    BUN 25 (*)    All other components within normal limits  CBC  PROTIME-INR     EKG  See ED course below.   RADIOLOGY Radiology interpreted by myself and radiology report reviewed.  Concerning for intracranial  masses, possibly metastatic in nature.  Some edema noted as well.    PROCEDURES:  Critical Care performed: Yes, see critical care procedure note(s)  .Critical Care  Performed by: Clarine Ozell LABOR, MD Authorized by: Clarine Ozell LABOR, MD   Critical care provider statement:    Critical care time (minutes):  30   Critical care time was exclusive of:  Separately billable procedures and treating other patients   Critical care was necessary to treat or prevent imminent or life-threatening deterioration of the following conditions:  CNS failure or compromise   Critical care was time spent personally by me on the following activities:  Development of treatment plan with patient or surrogate, discussions with consultants, evaluation of patient's response to treatment, examination of patient, ordering and review of laboratory studies, ordering and review of radiographic studies, ordering and performing treatments and interventions, pulse oximetry, re-evaluation of patient's condition and review of old charts   I assumed direction of critical care for this patient from another provider in my specialty: no      MEDICATIONS ORDERED IN ED: Medications  sodium chloride  0.9 % bolus 500 mL (500 mLs Intravenous New Bag/Given 06/15/24 1749)  acetaminophen  (TYLENOL ) tablet 1,000 mg (1,000 mg Oral Given 06/15/24 1749)  gadobutrol  (GADAVIST ) 1 MMOL/ML injection 7 mL (7 mLs Intravenous Contrast Given 06/15/24 1958)  dexamethasone  (DECADRON ) injection 10 mg (10 mg Intravenous Given 06/15/24 2233)     IMPRESSION / MDM / ASSESSMENT AND PLAN / ED COURSE  I reviewed the triage vital signs and the nursing notes.                              DDX/MDM/AP: Differential diagnosis includes, but is not limited to, likely episode of orthostatic hypotension, consider underlying anemia or electrolyte abnormality.  Consider possibility of intracranial hemorrhage, skull fracture, C-spine injury given age.  Doubt underlying  infection, considered but doubt arrhythmia.  Not clinically suspect ACS, PE.  Consider left shoulder fracture.  Plan: - Labs - CT head, CT C-spine - X-ray left shoulder - Tylenol  - Small bolus IV fluid - Orthostatics - EKG  Patient's presentation is most consistent with acute presentation with potential threat to life or bodily function.  The patient is on the cardiac monitor to evaluate for evidence of arrhythmia and/or significant heart rate changes.  ED course below.  Workup notable with abnormal CT head concerning for new intracranial mass, escalated to MRI which does show multiple masses concerning for possible metastatic disease.  Some  brain edema with no significant mass effect also noted.  Neurosurgery consulted, imaging reviewed--state stable for outpatient follow-up though would recommend initiation of steroids, can consider transfer to [facility] if patient would prefer a more expedited eval however.  Patient strongly prefers discharge home and close outpatient follow-up, is already closely established with his neurosurgeon and oncologist--will call them to schedule close follow-up appointment soon as possible.  Discharged with 10 further days of Decadron  per neurosurgery recommendations.  Recommend Tylenol  as needed as well.  Clinical Course as of 06/15/24 2247  Fri Jun 15, 2024  1715 Ecg = sinus rhythm, rate 95, no gross ST elevation or depression, no significant repolarization abnormality, normal axis, normal intervals.  No evidence of ischemia no arrhythmia my interpretation. [MM]  1733 Cbc wnl [MM]  1738 BMP with mild hyponatremia, mildly low bicarb, mild elevation of BUN/creatinine consistent with likely dehydration.  Small bolus IV fluid ordered. [MM]  1819 XR L shoulder: IMPRESSION: Degenerative changes in the left AC joint. No acute bony abnormality.   [MM]  1831 CTCspine: IMPRESSION: Multilevel degenerative disc and facet disease.  No acute bony abnormality.   [MM]   1831 CTH: IMPRESSION: 1.9 x 1 cm slightly hyperdense dural-based mass noted along the undersurface of the left tentorium near the midline. There also appears to be subcortical edema in the medial left occipital lobe concerning for underlying mass lesion. Recommend further evaluation with MRI with and without contrast.   [MM]  1832 MRI ordered [MM]  2052 MRI: IMPRESSION: 1. Multiple new leptomeningeal enhancing masses in the posterior fossa, detailed above and concerning for metastatic deposits given the history. 2. Adjacent brain edema in the cerebellum and occipital lobe without significant mass effect.   [MM]  2052 ED secretary paging neurosurgery [MM]  2102 D/w Dr. Dolan, will review imaging [MM]  2201 Neurosurgery reviewed images, state do not require emergent intervention but would recommend close outpatient follow-up with patient's neurosurgeon.  Recommends giving 10 mg Decadron  in emergency department then 2 mg p.o. twice daily thereafter.  If patient desires inpatient evaluation, can consider transfer to [facility]. [MM]  2236 Discussed findings with patient and her concern for recurrent metastases to brain.  Discussed neurosurgery recommendation for close outpatient follow-up on oral steroids or possibility of transferring to [facility] for more expedited neurosurgery evaluation if patient prefers this.  Patient strongly prefers discharge home and close outpatient follow-up.  He is established with his neurosurgeon (Dr. Bluford) at [facility] and also has a local oncologist--plans to follow-up closely with both.  Strict ED return precautions discussed with patient and wife.  Given dose of Decadron  here in emergency department per neurosurgery recommendations and will discharge with 2 mg p.o. twice daily thereafter.  Patient and family agree with plan.  Will proceed with discharge per their preference and neurosurgery recommendation. [MM]    Clinical Course User Index [MM] Clarine Ozell LABOR, MD      FINAL CLINICAL IMPRESSION(S) / ED DIAGNOSES   Final diagnoses:  Intracranial mass  Fall, initial encounter     Rx / DC Orders   ED Discharge Orders          Ordered    dexamethasone  (DECADRON ) 2 MG tablet  2 times daily with meals        06/15/24 2239    acetaminophen  (TYLENOL ) 500 MG tablet  Every 6 hours PRN        06/15/24 2239             Note:  This  document was prepared using Conservation officer, historic buildings and may include unintentional dictation errors.   Clarine Ozell LABOR, MD 06/15/24 [phone]

## 2024-06-15 NOTE — ED Notes (Signed)
 Pt discharged to home, instructions and medications reviewed.  Pt verbalized understanding. No questions at this time.

## 2024-06-15 NOTE — Discharge Instructions (Signed)
 Your evaluation in the emergency department was notable for new masses in your brain which are concerning for recurrent cancer.  I reviewed your case with our on-call neurosurgeon, and they believe you are stable for close outpatient follow-up which you preferred as well.  We have started you on a short course of steroids to help with some associated swelling in the brain, and you can also use Tylenol  as needed for any ongoing discomfort.  Please do follow-up with your neurosurgeon and oncologist for reevaluation and further management as soon as possible.  Return to the emergency department immediately with any new or worsening symptoms including confusion, worsened headache, seizures, loss of consciousness, or any other symptoms concerning to you.

## 2024-06-18 ENCOUNTER — Telehealth: Payer: Self-pay | Admitting: Oncology

## 2024-06-18 NOTE — Telephone Encounter (Signed)
 Patients wife Candis called and says he was in the ER. They told him his cancer has come back. The ER wants him to be seen as soon as possible. Please advise scheduling.   Candis would like for you to call her [phone]

## 2024-06-19 ENCOUNTER — Ambulatory Visit
Admission: RE | Admit: 2024-06-19 | Discharge: 2024-06-19 | Disposition: A | Discharge status: Home / Self Care | Source: Ambulatory Visit | Attending: Radiation Oncology | Admitting: Radiation Oncology

## 2024-06-19 ENCOUNTER — Inpatient Hospital Stay: Attending: Oncology | Admitting: Oncology

## 2024-06-19 ENCOUNTER — Encounter: Payer: Self-pay | Admitting: Oncology

## 2024-06-19 VITALS — BP 114/83 | HR 70 | Temp 96.8°F | Resp 16 | Wt 150.3 lb

## 2024-06-19 DIAGNOSIS — I3139 Other pericardial effusion (noninflammatory): Secondary | ICD-10-CM | POA: Diagnosis not present

## 2024-06-19 DIAGNOSIS — J449 Chronic obstructive pulmonary disease, unspecified: Secondary | ICD-10-CM | POA: Diagnosis not present

## 2024-06-19 DIAGNOSIS — K8020 Calculus of gallbladder without cholecystitis without obstruction: Secondary | ICD-10-CM | POA: Insufficient documentation

## 2024-06-19 DIAGNOSIS — F17210 Nicotine dependence, cigarettes, uncomplicated: Secondary | ICD-10-CM | POA: Diagnosis not present

## 2024-06-19 DIAGNOSIS — I2510 Atherosclerotic heart disease of native coronary artery without angina pectoris: Secondary | ICD-10-CM | POA: Diagnosis not present

## 2024-06-19 DIAGNOSIS — Z8673 Personal history of transient ischemic attack (TIA), and cerebral infarction without residual deficits: Secondary | ICD-10-CM | POA: Insufficient documentation

## 2024-06-19 DIAGNOSIS — C7931 Secondary malignant neoplasm of brain: Secondary | ICD-10-CM | POA: Insufficient documentation

## 2024-06-19 DIAGNOSIS — N529 Male erectile dysfunction, unspecified: Secondary | ICD-10-CM | POA: Diagnosis not present

## 2024-06-19 DIAGNOSIS — Z9221 Personal history of antineoplastic chemotherapy: Secondary | ICD-10-CM | POA: Diagnosis not present

## 2024-06-19 DIAGNOSIS — Z87891 Personal history of nicotine dependence: Secondary | ICD-10-CM | POA: Insufficient documentation

## 2024-06-19 DIAGNOSIS — G9389 Other specified disorders of brain: Secondary | ICD-10-CM | POA: Diagnosis not present

## 2024-06-19 DIAGNOSIS — C3411 Malignant neoplasm of upper lobe, right bronchus or lung: Secondary | ICD-10-CM | POA: Insufficient documentation

## 2024-06-19 DIAGNOSIS — Z7982 Long term (current) use of aspirin: Secondary | ICD-10-CM | POA: Insufficient documentation

## 2024-06-19 DIAGNOSIS — Z800 Family history of malignant neoplasm of digestive organs: Secondary | ICD-10-CM | POA: Diagnosis not present

## 2024-06-19 DIAGNOSIS — Z8616 Personal history of COVID-19: Secondary | ICD-10-CM | POA: Insufficient documentation

## 2024-06-19 DIAGNOSIS — I700 Atherosclerosis of aorta: Secondary | ICD-10-CM | POA: Insufficient documentation

## 2024-06-19 DIAGNOSIS — Z7952 Long term (current) use of systemic steroids: Secondary | ICD-10-CM | POA: Insufficient documentation

## 2024-06-19 DIAGNOSIS — M47819 Spondylosis without myelopathy or radiculopathy, site unspecified: Secondary | ICD-10-CM | POA: Diagnosis not present

## 2024-06-19 DIAGNOSIS — J432 Centrilobular emphysema: Secondary | ICD-10-CM | POA: Diagnosis not present

## 2024-06-19 DIAGNOSIS — Z510 Encounter for antineoplastic radiation therapy: Secondary | ICD-10-CM | POA: Diagnosis not present

## 2024-06-19 DIAGNOSIS — Z923 Personal history of irradiation: Secondary | ICD-10-CM | POA: Insufficient documentation

## 2024-06-19 NOTE — Progress Notes (Signed)
 Pt in for follow up, wife states patient is experiencing vision problems, unable to see TV.  Reports balance issues, pt fell on Friday and went to ED, had MRI and CT done. Wife also reports decreased appetite.

## 2024-06-19 NOTE — Progress Notes (Signed)
 Radiation Oncology Follow up Note  Name: Ethan Fitzgerald   Date:   06/19/2024 MRN:  [id number] DOB: [birth year]    This 68 y.o. male presents to the clinic today for reevaluation of multiple brain metastasis and patient treated back in June for solitary brain metastasis status post surgical resection at [facility] and patient with known prior stage IIIa (T4 N1 M0) adenocarcinoma the right lower lobe.  REFERRING PROVIDER: [name], NP  HPI: Patient is a 68 year old male previously treated postoperatively for solitary brain metastasis for stage IV adenocarcinoma the right lower lobe.SABRA  He recently presented with some subtle visual changes as well as headaches and had a fall which prompted a visit to the ER.  CT scan of the head and the ER showed a 1.9 x 1 cm hyperdense dural based mass noted along the undersurface of the left tentorium near the midline there is also what appeared to be subcortical edema in the medial left occipital lobe.  He underwent a recent MRI scan shows at least 7 new lesions with multiple new leptomeningeal Hance enhancing lesions in the posterior fossa including a 2.5 x 1.9 mass along the anterior aspect of the left posterior lateral leaflet.  There is also a 1 cm mass along the posterior midline 1 cm mass on the posterior right cerebellum 1.7 mass along the inferior anterior right cerebellum and 1.7 enhancing mass along the anterior left paramedian midline basal cistern.  Patient has been started on steroids.  He is having focal no focal neurologic deficits he is using a cane to assist with his balance which seems to be somewhat impaired.  I been asked to evaluate him for possible further palliative radiation therapy.  PET scan has been ordered and is pending.  COMPLICATIONS OF TREATMENT: none  FOLLOW UP COMPLIANCE: keeps appointments   PHYSICAL EXAM:  There were no vitals taken for this visit. Crude visual fields are within normal range motor or sensory and DTR levels are  equal and symmetric in upper lower extremities.  Proprioception is intact.  Well-developed well-nourished patient in NAD. HEENT reveals PERLA, EOMI, discs not visualized.  Oral cavity is clear. No oral mucosal lesions are identified. Neck is clear without evidence of cervical or supraclavicular adenopathy. Lungs are clear to A&P. Cardiac examination is essentially unremarkable with regular rate and rhythm without murmur rub or thrill. Abdomen is benign with no organomegaly or masses noted. Motor sensory and DTR levels are equal and symmetric in the upper and lower extremities. Cranial nerves II through XII are grossly intact. Proprioception is intact. No peripheral adenopathy or edema is identified. No motor or sensory levels are noted. Crude visual fields are within normal range.  RADIOLOGY RESULTS: CT scans MRI scans reviewed compatible with above-stated findings.  PLAN: At this time elected [facility] with whole brain radiation therapy based on the numerous metastatic lesions.  Would plan on delivering 30 Gray in 15 fractions.  I will use a slightly protracted course of radiation based on his previous postoperative radiation therapy.  Risks and benefits of treatment including skin reaction possible hair loss fatigue alteration of blood counts all were reviewed with the patient in detail.  I have personally set up and ordered CT simulation for this week.  I will review his PET CT scans when available and make further recommendations on palliative treatment at that time.  Patient comprehends my recommendations well.  I would like to take this opportunity to thank you for allowing me to participate in  the care of your patient.SABRA Marcey Penton, MD

## 2024-06-19 NOTE — Progress Notes (Signed)
 [facility]  [phone]) [phone] [phone]) [phone]  ID: MAN EFFERTZ OB: 1956-07-30  MR#: [id number]  RDW#:[id number]  Patient Care Team: [name], NP as PCP - General (Nurse Practitioner) Perla Evalene PARAS, MD as PCP - Cardiology (Cardiology) Rosemarie Eather GORMAN, MD as Referring Physician (Neurology) Verdene Gills, RN as Oncology Nurse Navigator Jacobo, Evalene PARAS, MD as Consulting Physician (Oncology) Gerard Frederick, NP as Nurse Practitioner (Cardiology) Devra Lands, RN as VBCI Care Management  CHIEF COMPLAINT: Stage IIIa adenocarcinoma of the right upper lobe lung, now with isolated brain metastasis.  INTERVAL HISTORY: Patient last seen in clinic on February 16, 2024.  He completed adjuvant radiation to his isolated brain metastasis in July 2025 and then had follow-up with radiation oncology on April 18, 2024.  More recently he developed headache and visual changes for approximately 3 to 4 weeks, but only presented to the emergency room after a fall.  Subsequent MRI of the brain revealed multiple recurrent metastatic lesions.  He otherwise feels well.  He does not complain of pain today. He denies any recent fevers or illnesses. He denies any chest pain, shortness of breath, or hemoptysis.  He has a chronic cough.  He has no nausea, vomiting, constipation, or diarrhea.  He has no urinary complaints.  Patient offers no further specific complaints today.  REVIEW OF SYSTEMS:   Review of Systems  Constitutional:  Positive for malaise/fatigue. Negative for fever and weight loss.  Eyes:  Positive for blurred vision.  Respiratory:  Positive for cough. Negative for hemoptysis and shortness of breath.   Cardiovascular: Negative.  Negative for chest pain and leg swelling.  Gastrointestinal: Negative.  Negative for abdominal pain.  Genitourinary: Negative.  Negative for dysuria.  Musculoskeletal:  Positive for falls. Negative for back pain.  Skin: Negative.   Negative for rash.  Neurological:  Positive for weakness and headaches. Negative for dizziness and focal weakness.  Psychiatric/Behavioral: Negative.  The patient is not nervous/anxious.   All other systems reviewed and are negative.   As per HPI. Otherwise, a complete review of systems is negative.  PAST MEDICAL HISTORY: Past Medical History:  Diagnosis Date   COPD (chronic obstructive pulmonary disease) (HCC)    mild   COVID-19 09/2019   Depression    Erectile dysfunction    Headache    daily, stress headaches   Knee pain, left    Seasonal allergies    Stroke (HCC)    Tobacco dependence     PAST SURGICAL HISTORY: Past Surgical History:  Procedure Laterality Date   BACK SURGERY     metal plate in back   COLONOSCOPY     COLONOSCOPY WITH PROPOFOL  N/A 04/26/2019   Procedure: COLONOSCOPY WITH PROPOFOL ;  Surgeon: Therisa Bi, MD;  Location: [facility] ENDOSCOPY;  Service: Gastroenterology;  Laterality: N/A;   FINE NEEDLE ASPIRATION  03/03/2023   Procedure: FINE NEEDLE ASPIRATION;  Surgeon: Isadora Hose, MD;  Location: [facility] ENDOSCOPY;  Service: Pulmonary;;   HERNIA REPAIR Right    IR ANGIO INTRA EXTRACRAN SEL COM CAROTID INNOMINATE UNI L MOD SED  03/27/2020   IR ANGIO VERTEBRAL SEL SUBCLAVIAN INNOMINATE UNI L MOD SED  03/27/2020   IR ANGIO VERTEBRAL SEL VERTEBRAL UNI R MOD SED  03/27/2020   IR CT HEAD LTD  03/27/2020   IR IMAGING GUIDED PORT INSERTION  03/29/2023   IR PERCUTANEOUS ART THROMBECTOMY/INFUSION INTRACRANIAL INC DIAG ANGIO  03/27/2020   KNEE ARTHROSCOPY Left 03/28/2015   Procedure: Arthroscopic partial medial meniscectomy plus  chondral debridement;  Surgeon: Helayne Glenn, MD;  Location: [facility];  Service: Orthopedics;  Laterality: Left;   RADIOLOGY WITH ANESTHESIA N/A 03/27/2020   Procedure: IR WITH ANESTHESIA;  Surgeon: Dolphus Carrion, MD;  Location: [facility] OR;  Service: Radiology;  Laterality: N/A;   VIDEO BRONCHOSCOPY WITH ENDOBRONCHIAL ULTRASOUND   03/03/2023   Procedure: VIDEO BRONCHOSCOPY WITH ENDOBRONCHIAL ULTRASOUND;  Surgeon: Isadora Hose, MD;  Location: [facility] ENDOSCOPY;  Service: Pulmonary;;    FAMILY HISTORY: Family History  Problem Relation Age of Onset   Diabetes Mother    Heart attack Mother 76   Cancer Father        liver    COPD Brother    HIV/AIDS Brother    Prostate cancer Neg Hx    Colon cancer Neg Hx     ADVANCED DIRECTIVES (Y/N):  N  HEALTH MAINTENANCE: Social History   Tobacco Use   Smoking status: Former    Current packs/day: 0.00    Average packs/day: 1.5 packs/day for 49.0 years (73.5 ttl pk-yrs)    Types: Cigarettes    Start date: 50    Quit date: 09/14/2017    Years since quitting: 6.7   Smokeless tobacco: Former  Building services engineer status: Never Used  Substance Use Topics   Alcohol use: Not Currently    Comment: 6 pack on weekend   Drug use: No     Colonoscopy:  PAP:  Bone density:  Lipid panel:  No Known Allergies  Current Outpatient Medications  Medication Sig Dispense Refill   acetaminophen  (TYLENOL ) 500 MG tablet Take 2 tablets (1,000 mg total) by mouth every 6 (six) hours as needed. 100 tablet 2   albuterol  (VENTOLIN  HFA) 108 (90 Base) MCG/ACT inhaler Inhale 2 puffs into the lungs every 4 (four) hours as needed for wheezing or shortness of breath. 1 each 2   aspirin  EC 81 MG tablet Take 1 tablet (81 mg total) by mouth daily. Swallow whole.     b complex vitamins capsule Take 1 capsule by mouth daily.     baclofen  (LIORESAL ) 10 MG tablet Take 10 mg by mouth as needed for muscle spasms (has if needed for spasms back of head post surgery).     Cholecalciferol (VITAMIN D ) 50 MCG (2000 UT) tablet Take 2,000 Units by mouth daily.     dexamethasone  (DECADRON ) 2 MG tablet Take 1 tablet (2 mg total) by mouth 2 (two) times daily with a meal for 10 days. 20 tablet 0   ezetimibe  (ZETIA ) 10 MG tablet Take 1 tablet (10 mg total) by mouth daily. 90 tablet 3   fluticasone  (FLONASE ) 50 MCG/ACT  nasal spray Place 2 sprays into both nostrils daily. 48 g 3   HYDROcodone -acetaminophen  (NORCO /VICODIN) 5-325 MG tablet Take 1 tablet every 8 hours by oral route for 5 days.     midodrine  (PROAMATINE ) 5 MG tablet TAKE 1 TABLET BY MOUTH 3 TIMES DAILY WITH MEALS. 270 tablet 1   Multiple Vitamins-Minerals (IMMUNE SUPPORT VITAMIN C PO) Take 1 tablet by mouth daily.     sildenafil  (REVATIO ) 20 MG tablet TAKE 1 TO 5 TABLETS BY MOUTH ABOUT 30 MINUTES PRIOR TO SEX.START WITH 1 THEN INCREASE 30 tablet 5   STIOLTO RESPIMAT  2.5-2.5 MCG/ACT AERS INHALE 2 PUFFS EVERY DAY 12 g 3   traMADol (ULTRAM) 50 MG tablet Take 50 mg by mouth every 6 (six) hours as needed.     acetaminophen  (TYLENOL ) 325 MG tablet Take 650 mg by mouth. (Patient  not taking: Reported on 06/19/2024)     Multiple Vitamin (MULTIVITAMIN WITH MINERALS) TABS tablet Take 1 tablet by mouth daily. (Patient not taking: Reported on 06/19/2024)     No current facility-administered medications for this visit.    OBJECTIVE: Vitals:   06/19/24 1013  BP: 114/83  Pulse: 70  Resp: 16  Temp: (!) 96.8 F (36 C)  SpO2: 100%       Body mass index is 21.57 kg/m.    ECOG FS:0 - Asymptomatic  General: Well-developed, well-nourished, no acute distress. Eyes: Pink conjunctiva, anicteric sclera. HEENT: Normocephalic, moist mucous membranes. Lungs: No audible wheezing or coughing. Heart: Regular rate and rhythm. Abdomen: Soft, nontender, no obvious distention. Musculoskeletal: No edema, cyanosis, or clubbing. Neuro: Alert, answering all questions appropriately. Cranial nerves grossly intact. Skin: No rashes or petechiae noted. Psych: Normal affect.  LAB RESULTS:  Lab Results  Component Value Date   NA 134 (L) 06/15/2024   K 3.5 06/15/2024   CL 102 06/15/2024   CO2 20 (L) 06/15/2024   GLUCOSE 96 06/15/2024   BUN 25 (H) 06/15/2024   CREATININE 1.17 06/15/2024   CALCIUM  10.0 06/15/2024   PROT 7.2 01/18/2024   ALBUMIN 3.8 01/18/2024   AST 26  01/18/2024   ALT 21 01/18/2024   ALKPHOS 76 01/18/2024   BILITOT 1.4 (H) 01/18/2024   GFRNONAA >60 06/15/2024   GFRAA 85 12/22/2020    Lab Results  Component Value Date   WBC 8.1 06/15/2024   NEUTROABS 4.6 01/11/2024   HGB 14.1 06/15/2024   HCT 41.9 06/15/2024   MCV 97.4 06/15/2024   PLT 242 06/15/2024     STUDIES: MR Brain W and Wo Contrast Result Date: 06/15/2024 EXAM: MRI BRAIN WITH AND WITHOUT CONTRAST 06/15/2024 07:57:31 PM TECHNIQUE: Multiplanar multisequence MRI of the head/brain was performed with and without the administration of intravenous contrast. COMPARISON: MRI head 02/21/2024. Same day CT head. CLINICAL HISTORY: Abnormal CT head, history of prior brain tumor s/p resection. FINDINGS: BRAIN AND VENTRICLES: Multiple new leptomeningeal enhancing masses in the posterior fossa. This includes : 2.5 x 1.9 cm mass along the anterior aspect of the left posterolateral leaflet at the tentorial incisura (series 20 image 13). 1 cm enhancing mass along the posterior midline cerebellum (series 18, image 54 and series 20, image 13 ). Adjacent edema in left occipital lobe and superior cerebllum. 1 cm enhancing mass along the posterior right cerebellum (series 18 image 47). 1.7 cm enhancing mass along the inferior and anterior right cerebellum (series 18, image 29). Adjcacent cerebellar edema. 1.7 cm enhancing mass along the anterior left paramidline basal cistern anterior to the basilar artery (series 18, image 39). Adjacent cerebellar edema. No acute infarct. Remote right frontal infarct. No acute intracranial hemorrhage.  No hydrocephalus. Normal flow voids. ORBITS: No acute abnormality. SINUSES: No acute abnormality. BONES AND SOFT TISSUES: Left suboccipital craniotomy. IMPRESSION: 1. Multiple new leptomeningeal enhancing masses in the posterior fossa, detailed above and concerning for metastatic deposits given the history. 2. Adjacent brain edema in the cerebellum and occipital lobe without  significant mass effect. Electronically signed by: Gilmore Molt MD 06/15/2024 08:42 PM EDT RP Workstation: [id number]   CT Cervical Spine Wo Contrast Result Date: 06/15/2024 CLINICAL DATA:  Fall EXAM: CT CERVICAL SPINE WITHOUT CONTRAST TECHNIQUE: Multidetector CT imaging of the cervical spine was performed without intravenous contrast. Multiplanar CT image reconstructions were also generated. RADIATION DOSE REDUCTION: This exam was performed according to the departmental dose-optimization program which includes automated exposure control, adjustment  of the mA and/or kV according to patient size and/or use of iterative reconstruction technique. COMPARISON:  None Available. FINDINGS: Alignment: Normal. Skull base and vertebrae: No acute fracture. No primary bone lesion or focal pathologic process. Soft tissues and spinal canal: No prevertebral fluid or swelling. No visible canal hematoma. Disc levels: Disc space narrowing and spurring most notable at C5-6 and C6-7. Moderate to advanced bilateral degenerative facet disease, right greater than left. Multilevel bilateral neural foraminal narrowing, right greater than left. Upper chest: Emphysema. Other: None IMPRESSION: Multilevel degenerative disc and facet disease. No acute bony abnormality. Electronically Signed   By: Franky Crease M.D.   On: 06/15/2024 18:22   CT Head Wo Contrast Result Date: 06/15/2024 CLINICAL DATA:  Fall, headache EXAM: CT HEAD WITHOUT CONTRAST TECHNIQUE: Contiguous axial images were obtained from the base of the skull through the vertex without intravenous contrast. RADIATION DOSE REDUCTION: This exam was performed according to the departmental dose-optimization program which includes automated exposure control, adjustment of the mA and/or kV according to patient size and/or use of iterative reconstruction technique. COMPARISON:  01/18/2024 FINDINGS: Brain: Encephalomalacia noted in the inferior left cerebellum with overlying craniotomy  defect. This is in the area of prior cerebellar mass, now resected. Stable encephalomalacia in the right MCA territory. Mild atrophy. No acute infarct or hemorrhage. Slightly hyperdense mass now noted to the left of midline measuring 1.9 x 1 cm on image 17. this appears to be dural-based along the undersurface of the left tentorium. Also, in the adjacent medial left occipital lobe, there is subcortical edema concerning for possible mass lesion. Vascular: No hyperdense vessel or unexpected calcification. Skull: No acute calvarial abnormality. Prior left lower occipital craniotomy. Sinuses/Orbits: No acute findings Other: None IMPRESSION: 1.9 x 1 cm slightly hyperdense dural-based mass noted along the undersurface of the left tentorium near the midline. There also appears to be subcortical edema in the medial left occipital lobe concerning for underlying mass lesion. Recommend further evaluation with MRI with and without contrast. Electronically Signed   By: Franky Crease M.D.   On: 06/15/2024 18:20   DG Shoulder Left Result Date: 06/15/2024 CLINICAL DATA:  Fall, left shoulder pain EXAM: LEFT SHOULDER - 2+ VIEW COMPARISON:  None Available. FINDINGS: Degenerative changes in the [facility] joint with joint space narrowing and spurring. Glenohumeral joint is maintained. No acute bony abnormality. Specifically, no fracture, subluxation, or dislocation. Soft tissues are intact. IMPRESSION: Degenerative changes in the left AC joint. No acute bony abnormality. Electronically Signed   By: Franky Crease M.D.   On: 06/15/2024 18:11     ASSESSMENT: Recurrent stage IV adenocarcinoma of the right upper lobe lung with brain metastasis.SABRA  PLAN:    Recurrent stage IV adenocarcinoma of the right upper lobe lung: Previously, patient was considered stage III disease and completed concurrent XRT along with weekly carboplatin  and Taxol  which on May 27, 2023.  Patient initiated his year-long maintenance durvalumab  on June 08, 2023.  He initiated maintenance durvalumab  on July 13, 2023 and received his final treatment on Jan 11, 2024.  Patient did not complete the recommended year-long maintenance treatment secondary to financial concerns.  Patient's most recent imaging with CT scan on Jan 18, 2024 did not reveal any evidence of recurrent or progressive disease.  Initial staging MRI of the brain on February 11, 2023 did not reveal any metastatic disease, but subsequent imaging on Jan 18, 2024 revealed a new 3.1 x 2.6 cm mass in the left cerebellum.  Patient subsequently  underwent surgical resection at [facility] and then adjuvant XRT completing in approximately July 2025.  Repeat MRI on February 21, 2024 did not reveal any other evidence of metastatic disease.  Upon evaluation in the ER, repeat brain MRI on June 15, 2024 revealed multiple new leptomeningeal masses with surrounding edema.  Patient has consultation with radiation oncology today to discuss treatment options.  Will get a PET scan in the next 1 to 2 weeks to assess for any systemic recurrence.  Patient return to clinic after his PET scan to discuss the results. Brain metastasis: Imaging and treatment as above.  He has been instructed to continue his dexamethasone .  Treatment per radiation oncology. Disposition: We briefly discussed hospice and end-of-life in clinic today, but patient is not ready to make this decision.  He would like to talk to radiation oncology and have his PET scan before proceeding with any decisions.  Will schedule appointment with palliative care along with his next clinic visit.  I spent a total of 30 minutes reviewing chart data, face-to-face evaluation with the patient, counseling and coordination of care as detailed above.    Patient expressed understanding and was in agreement with this plan. He also understands that He can call clinic at any time with any questions, concerns, or complaints.    Cancer Staging  Adenocarcinoma of upper  lobe of right lung [facility]) Staging form: Lung, AJCC 8th Edition - Clinical stage from 03/15/2023: Stage IIIA (cT4, cN0, cM0) - Signed by Jacobo Evalene PARAS, MD on 03/15/2023 Stage prefix: Initial diagnosis   Evalene PARAS Jacobo, MD   06/19/2024 10:20 AM

## 2024-06-20 ENCOUNTER — Other Ambulatory Visit: Payer: Self-pay | Admitting: *Deleted

## 2024-06-20 ENCOUNTER — Ambulatory Visit
Admission: RE | Admit: 2024-06-20 | Discharge: 2024-06-20 | Disposition: A | Discharge status: Home / Self Care | Source: Ambulatory Visit | Attending: Radiation Oncology | Admitting: Radiation Oncology

## 2024-06-20 DIAGNOSIS — C3411 Malignant neoplasm of upper lobe, right bronchus or lung: Secondary | ICD-10-CM | POA: Insufficient documentation

## 2024-06-20 DIAGNOSIS — Z510 Encounter for antineoplastic radiation therapy: Secondary | ICD-10-CM | POA: Insufficient documentation

## 2024-06-20 DIAGNOSIS — C7931 Secondary malignant neoplasm of brain: Secondary | ICD-10-CM | POA: Diagnosis not present

## 2024-06-20 DIAGNOSIS — J449 Chronic obstructive pulmonary disease, unspecified: Secondary | ICD-10-CM | POA: Diagnosis not present

## 2024-06-20 DIAGNOSIS — N529 Male erectile dysfunction, unspecified: Secondary | ICD-10-CM | POA: Diagnosis not present

## 2024-06-20 DIAGNOSIS — J432 Centrilobular emphysema: Secondary | ICD-10-CM | POA: Diagnosis not present

## 2024-06-20 DIAGNOSIS — I700 Atherosclerosis of aorta: Secondary | ICD-10-CM | POA: Diagnosis not present

## 2024-06-20 DIAGNOSIS — Z87891 Personal history of nicotine dependence: Secondary | ICD-10-CM | POA: Diagnosis not present

## 2024-06-20 DIAGNOSIS — I3139 Other pericardial effusion (noninflammatory): Secondary | ICD-10-CM | POA: Diagnosis not present

## 2024-06-20 MED ORDER — DEXAMETHASONE 2 MG PO TABS: 2.0000 mg | ORAL_TABLET | Freq: Two times a day (BID) | ORAL | 1 refills | Status: DC

## 2024-06-22 DIAGNOSIS — S8265XA Nondisplaced fracture of lateral malleolus of left fibula, initial encounter for closed fracture: Secondary | ICD-10-CM | POA: Diagnosis not present

## 2024-06-23 DIAGNOSIS — Z510 Encounter for antineoplastic radiation therapy: Secondary | ICD-10-CM | POA: Diagnosis not present

## 2024-06-25 ENCOUNTER — Other Ambulatory Visit: Payer: Self-pay

## 2024-06-25 ENCOUNTER — Ambulatory Visit
Admission: RE | Admit: 2024-06-25 | Discharge: 2024-06-25 | Disposition: A | Source: Ambulatory Visit | Attending: Radiation Oncology | Admitting: Radiation Oncology

## 2024-06-25 DIAGNOSIS — C7931 Secondary malignant neoplasm of brain: Secondary | ICD-10-CM | POA: Diagnosis not present

## 2024-06-25 DIAGNOSIS — Z87891 Personal history of nicotine dependence: Secondary | ICD-10-CM | POA: Diagnosis not present

## 2024-06-25 DIAGNOSIS — C3411 Malignant neoplasm of upper lobe, right bronchus or lung: Secondary | ICD-10-CM | POA: Diagnosis not present

## 2024-06-25 DIAGNOSIS — Z510 Encounter for antineoplastic radiation therapy: Secondary | ICD-10-CM | POA: Diagnosis not present

## 2024-06-25 LAB — RAD ONC ARIA SESSION SUMMARY
Course Elapsed Days: 0
Plan Fractions Treated to Date: 1
Plan Prescribed Dose Per Fraction: 2 Gy
Plan Total Fractions Prescribed: 15
Plan Total Prescribed Dose: 30 Gy
Reference Point Dosage Given to Date: 2 Gy
Reference Point Session Dosage Given: 2 Gy
Session Number: 1

## 2024-06-26 ENCOUNTER — Other Ambulatory Visit: Payer: Self-pay

## 2024-06-26 ENCOUNTER — Ambulatory Visit
Admission: RE | Admit: 2024-06-26 | Discharge: 2024-06-26 | Disposition: A | Discharge status: Home / Self Care | Source: Ambulatory Visit | Attending: Oncology | Admitting: Oncology

## 2024-06-26 ENCOUNTER — Ambulatory Visit
Admission: RE | Admit: 2024-06-26 | Discharge: 2024-06-26 | Disposition: A | Source: Ambulatory Visit | Attending: Radiation Oncology | Admitting: Radiation Oncology

## 2024-06-26 DIAGNOSIS — I2510 Atherosclerotic heart disease of native coronary artery without angina pectoris: Secondary | ICD-10-CM | POA: Diagnosis not present

## 2024-06-26 DIAGNOSIS — C7931 Secondary malignant neoplasm of brain: Secondary | ICD-10-CM | POA: Diagnosis not present

## 2024-06-26 DIAGNOSIS — G939 Disorder of brain, unspecified: Secondary | ICD-10-CM | POA: Diagnosis not present

## 2024-06-26 DIAGNOSIS — K8020 Calculus of gallbladder without cholecystitis without obstruction: Secondary | ICD-10-CM | POA: Diagnosis not present

## 2024-06-26 DIAGNOSIS — J449 Chronic obstructive pulmonary disease, unspecified: Secondary | ICD-10-CM | POA: Diagnosis not present

## 2024-06-26 DIAGNOSIS — N529 Male erectile dysfunction, unspecified: Secondary | ICD-10-CM | POA: Diagnosis not present

## 2024-06-26 DIAGNOSIS — F17210 Nicotine dependence, cigarettes, uncomplicated: Secondary | ICD-10-CM | POA: Diagnosis not present

## 2024-06-26 DIAGNOSIS — I700 Atherosclerosis of aorta: Secondary | ICD-10-CM | POA: Diagnosis not present

## 2024-06-26 DIAGNOSIS — J439 Emphysema, unspecified: Secondary | ICD-10-CM | POA: Diagnosis not present

## 2024-06-26 DIAGNOSIS — I3139 Other pericardial effusion (noninflammatory): Secondary | ICD-10-CM | POA: Insufficient documentation

## 2024-06-26 DIAGNOSIS — J432 Centrilobular emphysema: Secondary | ICD-10-CM | POA: Diagnosis not present

## 2024-06-26 DIAGNOSIS — G9389 Other specified disorders of brain: Secondary | ICD-10-CM

## 2024-06-26 DIAGNOSIS — Z510 Encounter for antineoplastic radiation therapy: Secondary | ICD-10-CM | POA: Diagnosis not present

## 2024-06-26 DIAGNOSIS — C3411 Malignant neoplasm of upper lobe, right bronchus or lung: Secondary | ICD-10-CM | POA: Diagnosis not present

## 2024-06-26 DIAGNOSIS — Z87891 Personal history of nicotine dependence: Secondary | ICD-10-CM | POA: Diagnosis not present

## 2024-06-26 LAB — RAD ONC ARIA SESSION SUMMARY
Course Elapsed Days: 1
Plan Fractions Treated to Date: 2
Plan Prescribed Dose Per Fraction: 2 Gy
Plan Total Fractions Prescribed: 15
Plan Total Prescribed Dose: 30 Gy
Reference Point Dosage Given to Date: 4 Gy
Reference Point Session Dosage Given: 2 Gy
Session Number: 2

## 2024-06-26 LAB — GLUCOSE, CAPILLARY: Glucose-Capillary: 117 mg/dL — ABNORMAL HIGH (ref 70–99)

## 2024-06-26 MED ORDER — FLUDEOXYGLUCOSE F - 18 (FDG) INJECTION: 8.0000 | Freq: Once | INTRAVENOUS | Status: DC | PRN

## 2024-06-27 ENCOUNTER — Other Ambulatory Visit: Payer: Self-pay

## 2024-06-27 ENCOUNTER — Ambulatory Visit
Admission: RE | Admit: 2024-06-27 | Discharge: 2024-06-27 | Disposition: A | Discharge status: Home / Self Care | Source: Ambulatory Visit | Attending: Radiation Oncology | Admitting: Radiation Oncology

## 2024-06-27 DIAGNOSIS — F17210 Nicotine dependence, cigarettes, uncomplicated: Secondary | ICD-10-CM | POA: Diagnosis not present

## 2024-06-27 DIAGNOSIS — J432 Centrilobular emphysema: Secondary | ICD-10-CM | POA: Diagnosis not present

## 2024-06-27 DIAGNOSIS — J449 Chronic obstructive pulmonary disease, unspecified: Secondary | ICD-10-CM | POA: Diagnosis not present

## 2024-06-27 DIAGNOSIS — C7931 Secondary malignant neoplasm of brain: Secondary | ICD-10-CM | POA: Diagnosis not present

## 2024-06-27 DIAGNOSIS — C3411 Malignant neoplasm of upper lobe, right bronchus or lung: Secondary | ICD-10-CM | POA: Diagnosis not present

## 2024-06-27 DIAGNOSIS — N529 Male erectile dysfunction, unspecified: Secondary | ICD-10-CM | POA: Diagnosis not present

## 2024-06-27 DIAGNOSIS — Z87891 Personal history of nicotine dependence: Secondary | ICD-10-CM | POA: Diagnosis not present

## 2024-06-27 DIAGNOSIS — Z510 Encounter for antineoplastic radiation therapy: Secondary | ICD-10-CM | POA: Diagnosis not present

## 2024-06-27 DIAGNOSIS — I3139 Other pericardial effusion (noninflammatory): Secondary | ICD-10-CM | POA: Diagnosis not present

## 2024-06-27 DIAGNOSIS — I700 Atherosclerosis of aorta: Secondary | ICD-10-CM | POA: Diagnosis not present

## 2024-06-27 LAB — RAD ONC ARIA SESSION SUMMARY
Course Elapsed Days: 2
Plan Fractions Treated to Date: 3
Plan Prescribed Dose Per Fraction: 2 Gy
Plan Total Fractions Prescribed: 15
Plan Total Prescribed Dose: 30 Gy
Reference Point Dosage Given to Date: 6 Gy
Reference Point Session Dosage Given: 2 Gy
Session Number: 3

## 2024-06-28 ENCOUNTER — Other Ambulatory Visit: Payer: Self-pay

## 2024-06-28 ENCOUNTER — Ambulatory Visit
Admission: RE | Admit: 2024-06-28 | Discharge: 2024-06-28 | Disposition: A | Discharge status: Home / Self Care | Source: Ambulatory Visit | Attending: Radiation Oncology | Admitting: Radiation Oncology

## 2024-06-28 DIAGNOSIS — J432 Centrilobular emphysema: Secondary | ICD-10-CM | POA: Diagnosis not present

## 2024-06-28 DIAGNOSIS — I3139 Other pericardial effusion (noninflammatory): Secondary | ICD-10-CM | POA: Diagnosis not present

## 2024-06-28 DIAGNOSIS — J449 Chronic obstructive pulmonary disease, unspecified: Secondary | ICD-10-CM | POA: Diagnosis not present

## 2024-06-28 DIAGNOSIS — C3411 Malignant neoplasm of upper lobe, right bronchus or lung: Secondary | ICD-10-CM | POA: Diagnosis not present

## 2024-06-28 DIAGNOSIS — F17210 Nicotine dependence, cigarettes, uncomplicated: Secondary | ICD-10-CM | POA: Diagnosis not present

## 2024-06-28 DIAGNOSIS — I700 Atherosclerosis of aorta: Secondary | ICD-10-CM | POA: Diagnosis not present

## 2024-06-28 DIAGNOSIS — Z510 Encounter for antineoplastic radiation therapy: Secondary | ICD-10-CM | POA: Diagnosis not present

## 2024-06-28 DIAGNOSIS — Z87891 Personal history of nicotine dependence: Secondary | ICD-10-CM | POA: Diagnosis not present

## 2024-06-28 DIAGNOSIS — C7931 Secondary malignant neoplasm of brain: Secondary | ICD-10-CM | POA: Diagnosis not present

## 2024-06-28 LAB — RAD ONC ARIA SESSION SUMMARY
Course Elapsed Days: 3
Plan Fractions Treated to Date: 4
Plan Prescribed Dose Per Fraction: 2 Gy
Plan Total Fractions Prescribed: 15
Plan Total Prescribed Dose: 30 Gy
Reference Point Dosage Given to Date: 8 Gy
Reference Point Session Dosage Given: 2 Gy
Session Number: 4

## 2024-06-29 ENCOUNTER — Encounter: Payer: Self-pay | Admitting: Oncology

## 2024-06-29 ENCOUNTER — Ambulatory Visit: Admitting: Nurse Practitioner

## 2024-06-29 ENCOUNTER — Encounter: Payer: Self-pay | Admitting: Hospice and Palliative Medicine

## 2024-06-29 ENCOUNTER — Inpatient Hospital Stay: Admitting: Oncology

## 2024-06-29 ENCOUNTER — Inpatient Hospital Stay (HOSPITAL_BASED_OUTPATIENT_CLINIC_OR_DEPARTMENT_OTHER): Admitting: Hospice and Palliative Medicine

## 2024-06-29 ENCOUNTER — Other Ambulatory Visit: Payer: Self-pay

## 2024-06-29 ENCOUNTER — Ambulatory Visit
Admission: RE | Admit: 2024-06-29 | Discharge: 2024-06-29 | Disposition: A | Discharge status: Home / Self Care | Source: Ambulatory Visit | Attending: Radiation Oncology | Admitting: Radiation Oncology

## 2024-06-29 VITALS — BP 116/82 | HR 112 | Temp 98.0°F | Resp 18 | Ht 70.0 in | Wt 153.0 lb

## 2024-06-29 DIAGNOSIS — J449 Chronic obstructive pulmonary disease, unspecified: Secondary | ICD-10-CM | POA: Diagnosis not present

## 2024-06-29 DIAGNOSIS — C3411 Malignant neoplasm of upper lobe, right bronchus or lung: Secondary | ICD-10-CM | POA: Diagnosis not present

## 2024-06-29 DIAGNOSIS — G9389 Other specified disorders of brain: Secondary | ICD-10-CM | POA: Diagnosis not present

## 2024-06-29 DIAGNOSIS — Z515 Encounter for palliative care: Secondary | ICD-10-CM | POA: Diagnosis not present

## 2024-06-29 DIAGNOSIS — Z510 Encounter for antineoplastic radiation therapy: Secondary | ICD-10-CM | POA: Diagnosis not present

## 2024-06-29 LAB — RAD ONC ARIA SESSION SUMMARY
Course Elapsed Days: 4
Plan Fractions Treated to Date: 5
Plan Prescribed Dose Per Fraction: 2 Gy
Plan Total Fractions Prescribed: 15
Plan Total Prescribed Dose: 30 Gy
Reference Point Dosage Given to Date: 10 Gy
Reference Point Session Dosage Given: 2 Gy
Session Number: 5

## 2024-06-29 MED ORDER — OXYCODONE HCL 5 MG PO TABS: 5.0000 mg | ORAL_TABLET | Freq: Four times a day (QID) | ORAL | 0 refills | Status: DC | PRN

## 2024-06-29 NOTE — Progress Notes (Signed)
 Patient had a PET scan on 06/26/2024.

## 2024-06-29 NOTE — Progress Notes (Signed)
 Palliative Medicine [facility] at [facility] [phone]) [phone] [phone]) [phone]   Name: Ethan Fitzgerald Date: 06/29/2024 MRN: [id number]  DOB: 1956-09-02  Patient Care Team: [name], NP as PCP - General (Nurse Practitioner) Perla Evalene PARAS, MD as PCP - Cardiology (Cardiology) Rosemarie Eather GORMAN, MD as Referring Physician (Neurology) Verdene Gills, RN as Oncology Nurse Navigator Jacobo, Evalene PARAS, MD as Consulting Physician (Oncology) Gerard Frederick, NP as Nurse Practitioner (Cardiology) Devra Lands, RN as VBCI Care Management    REASON FOR CONSULTATION: Ethan Fitzgerald is a 68 y.o. male with multiple medical problems including stage IV adenocarcinoma of the right lung now with brain metastasis.  He previously completed concurrent XRT and CarboTaxol chemotherapy and then was treated with maintenance Durvalumab  (final treatment on Jan 11, 2024).  Patient underwent surgical resection of isolated brain met at [facility] in July 2025.  Now with progressive leptomeningeal masses. Palliative care consulted to address goals of manage ongoing symptoms.  SOCIAL HISTORY:     reports that he quit smoking about 6 years ago. His smoking use included cigarettes. He started smoking about 55 years ago. He has a 73.5 pack-year smoking history. He has quit using smokeless tobacco. He reports that he does not currently use alcohol. He reports that he does not use drugs.  Patient is married and lives at home with his wife.  He has stepchildren.  Patient previously worked in Production designer, theatre/television/film.  ADVANCE DIRECTIVES:  Not on file  CODE STATUS: DNR/DNI (DNR order signed on 06/29/2024)  PAST MEDICAL HISTORY: Past Medical History:  Diagnosis Date   COPD (chronic obstructive pulmonary disease) (HCC)    mild   COVID-19 09/2019   Depression    Erectile dysfunction    Headache    daily, stress headaches   Knee pain, left    Seasonal allergies    Stroke (HCC)     Tobacco dependence     PAST SURGICAL HISTORY:  Past Surgical History:  Procedure Laterality Date   BACK SURGERY     metal plate in back   COLONOSCOPY     COLONOSCOPY WITH PROPOFOL  N/A 04/26/2019   Procedure: COLONOSCOPY WITH PROPOFOL ;  Surgeon: Therisa Bi, MD;  Location: [facility] ENDOSCOPY;  Service: Gastroenterology;  Laterality: N/A;   FINE NEEDLE ASPIRATION  03/03/2023   Procedure: FINE NEEDLE ASPIRATION;  Surgeon: Isadora Hose, MD;  Location: [facility] ENDOSCOPY;  Service: Pulmonary;;   HERNIA REPAIR Right    IR ANGIO INTRA EXTRACRAN SEL COM CAROTID INNOMINATE UNI L MOD SED  03/27/2020   IR ANGIO VERTEBRAL SEL SUBCLAVIAN INNOMINATE UNI L MOD SED  03/27/2020   IR ANGIO VERTEBRAL SEL VERTEBRAL UNI R MOD SED  03/27/2020   IR CT HEAD LTD  03/27/2020   IR IMAGING GUIDED PORT INSERTION  03/29/2023   IR PERCUTANEOUS ART THROMBECTOMY/INFUSION INTRACRANIAL INC DIAG ANGIO  03/27/2020   KNEE ARTHROSCOPY Left 03/28/2015   Procedure: Arthroscopic partial medial meniscectomy plus chondral debridement;  Surgeon: Helayne Glenn, MD;  Location: [facility];  Service: Orthopedics;  Laterality: Left;   RADIOLOGY WITH ANESTHESIA N/A 03/27/2020   Procedure: IR WITH ANESTHESIA;  Surgeon: Dolphus Carrion, MD;  Location: [facility] OR;  Service: Radiology;  Laterality: N/A;   VIDEO BRONCHOSCOPY WITH ENDOBRONCHIAL ULTRASOUND  03/03/2023   Procedure: VIDEO BRONCHOSCOPY WITH ENDOBRONCHIAL ULTRASOUND;  Surgeon: Isadora Hose, MD;  Location: [facility] ENDOSCOPY;  Service: Pulmonary;;    HEMATOLOGY/ONCOLOGY HISTORY:  Oncology History  Adenocarcinoma of upper lobe of right lung (  HCC)  03/15/2023 Initial Diagnosis   Adenocarcinoma of upper lobe of right lung (HCC)   03/15/2023 Cancer Staging   Staging form: Lung, AJCC 8th Edition - Clinical stage from 03/15/2023: Stage IIIA (cT4, cN0, cM0) - Signed by Jacobo Evalene PARAS, MD on 03/15/2023 Stage prefix: Initial diagnosis   04/05/2023 - 05/25/2023 Chemotherapy   Patient is  on Treatment Plan : LUNG Carboplatin  + Paclitaxel  + XRT q7d     06/08/2023 - 01/11/2024 Chemotherapy   Patient is on Treatment Plan : LUNG Durvalumab  (10) q14d       ALLERGIES:  has no known allergies.  MEDICATIONS:  Current Outpatient Medications  Medication Sig Dispense Refill   acetaminophen  (TYLENOL ) 325 MG tablet Take 650 mg by mouth. (Patient not taking: Reported on 06/29/2024)     acetaminophen  (TYLENOL ) 500 MG tablet Take 2 tablets (1,000 mg total) by mouth every 6 (six) hours as needed. 100 tablet 2   albuterol  (VENTOLIN  HFA) 108 (90 Base) MCG/ACT inhaler Inhale 2 puffs into the lungs every 4 (four) hours as needed for wheezing or shortness of breath. 1 each 2   aspirin  EC 81 MG tablet Take 1 tablet (81 mg total) by mouth daily. Swallow whole.     b complex vitamins capsule Take 1 capsule by mouth daily.     baclofen  (LIORESAL ) 10 MG tablet Take 10 mg by mouth as needed for muscle spasms (has if needed for spasms back of head post surgery).     Cholecalciferol (VITAMIN D ) 50 MCG (2000 UT) tablet Take 2,000 Units by mouth daily.     dexamethasone  (DECADRON ) 2 MG tablet Take 1 tablet (2 mg total) by mouth 2 (two) times daily with a meal for 100 doses. 50 tablet 1   ezetimibe  (ZETIA ) 10 MG tablet Take 1 tablet (10 mg total) by mouth daily. 90 tablet 3   fluticasone  (FLONASE ) 50 MCG/ACT nasal spray Place 2 sprays into both nostrils daily. 48 g 3   HYDROcodone -acetaminophen  (NORCO /VICODIN) 5-325 MG tablet Take 1 tablet every 8 hours by oral route for 5 days.     midodrine  (PROAMATINE ) 5 MG tablet TAKE 1 TABLET BY MOUTH 3 TIMES DAILY WITH MEALS. 270 tablet 1   Multiple Vitamin (MULTIVITAMIN WITH MINERALS) TABS tablet Take 1 tablet by mouth daily. (Patient not taking: Reported on 06/29/2024)     Multiple Vitamins-Minerals (IMMUNE SUPPORT VITAMIN C PO) Take 1 tablet by mouth daily.     sildenafil  (REVATIO ) 20 MG tablet TAKE 1 TO 5 TABLETS BY MOUTH ABOUT 30 MINUTES PRIOR TO SEX.START WITH 1  THEN INCREASE 30 tablet 5   STIOLTO RESPIMAT  2.5-2.5 MCG/ACT AERS INHALE 2 PUFFS EVERY DAY 12 g 3   traMADol (ULTRAM) 50 MG tablet Take 50 mg by mouth every 6 (six) hours as needed.     No current facility-administered medications for this visit.   Facility-Administered Medications Ordered in Other Visits  Medication Dose Route Frequency Provider Last Rate Last Admin   fludeoxyglucose  F - 18 (FDG) injection 8 millicurie  8 millicurie Intravenous Once PRN Mugweru, Jon, MD        VITAL SIGNS: Ht 5' 10 (1.778 m)   BMI 21.57 kg/m  There were no vitals filed for this visit.  Estimated body mass index is 21.57 kg/m as calculated from the following:   Height as of this encounter: 5' 10 (1.778 m).   Weight as of 06/19/24: 150 lb 4.8 oz (68.2 kg).  LABS: CBC:    Component Value Date/Time  WBC 8.1 06/15/2024 1710   HGB 14.1 06/15/2024 1710   HGB 14.2 03/12/2024 1016   HCT 41.9 06/15/2024 1710   PLT 242 06/15/2024 1710   PLT 194 03/12/2024 1016   MCV 97.4 06/15/2024 1710   NEUTROABS 4.6 01/11/2024 0748   LYMPHSABS 1.5 01/11/2024 0748   MONOABS 0.7 01/11/2024 0748   EOSABS 0.6 (H) 01/11/2024 0748   BASOSABS 0.1 01/11/2024 0748   Comprehensive Metabolic Panel:    Component Value Date/Time   NA 134 (L) 06/15/2024 1710   K 3.5 06/15/2024 1710   CL 102 06/15/2024 1710   CO2 20 (L) 06/15/2024 1710   BUN 25 (H) 06/15/2024 1710   CREATININE 1.17 06/15/2024 1710   CREATININE 1.23 01/11/2024 0748   CREATININE 0.92 12/22/2022 0808   GLUCOSE 96 06/15/2024 1710   CALCIUM  10.0 06/15/2024 1710   AST 26 01/18/2024 1719   AST 31 01/11/2024 0748   ALT 21 01/18/2024 1719   ALT 23 01/11/2024 0748   ALKPHOS 76 01/18/2024 1719   BILITOT 1.4 (H) 01/18/2024 1719   BILITOT 0.7 01/11/2024 0748   PROT 7.2 01/18/2024 1719   ALBUMIN 3.8 01/18/2024 1719    RADIOGRAPHIC STUDIES: NM PET Image Restag (PS) Skull Base To Thigh Result Date: 06/27/2024 CLINICAL DATA:  Subsequent treatment  strategy for lung cancer. EXAM: NUCLEAR MEDICINE PET SKULL BASE TO THIGH TECHNIQUE: 8.0 mCi F-18 FDG was injected intravenously. Full-ring PET imaging was performed from the skull base to thigh after the radiotracer. CT data was obtained and used for attenuation correction and anatomic localization. Fasting blood glucose: 117 mg/dl COMPARISON:  CT chest abdomen pelvis 01/18/2024 and PET 08/17/2023. FINDINGS: Mediastinal blood pool activity: SUV max 2.2 Liver activity: SUV max NA NECK: Hypermetabolic lesions in the posterior fossa with index lesion anterior to the brainstem, SUV max 14.1. No additional abnormal hypermetabolism. Incidental CT findings: Right temporal encephalomalacia.  Cerebral atrophy. CHEST: Right lower lobe mass straddles the upper right major fissure measures 3.2 x 4.2 cm, has decreased in size from 3.6 x 4.5 cm with minimal residual focal hypermetabolism along the medial margin, SUV max 3.6. Minimally hypermetabolic low right paratracheal lymph node measures 8 mm (SUV max 2.7. No additional abnormal hypermetabolism. Incidental CT findings: Left IJ Port-A-Cath terminates in the right atrium. Atherosclerotic calcification of the aorta and coronary arteries. Trace pericardial effusion. Centrilobular and paraseptal emphysema. ABDOMEN/PELVIS: No abnormal hypermetabolism. Incidental CT findings: Gallstones. SKELETON: No abnormal hypermetabolism. Incidental CT findings: Postoperative changes in the lumbar spine. Degenerative changes in the spine. IMPRESSION: 1. New hypermetabolic brain metastases, as on MR brain 06/15/2024. 2. Right lower lobe mass has decreased slightly in size in the interval with only a tiny focal area of residual hypermetabolism along the medial margin. Small mildly hypermetabolic low right paratracheal lymph node. 3. Trace pericardial effusion. 4. Cholelithiasis. 5. Aortic atherosclerosis (ICD10-I70.0). Coronary artery calcification. 6.  Emphysema (ICD10-J43.9). Electronically  Signed   By: Newell Eke M.D.   On: 06/27/2024 16:48   MR Brain W and Wo Contrast Result Date: 06/15/2024 EXAM: MRI BRAIN WITH AND WITHOUT CONTRAST 06/15/2024 07:57:31 PM TECHNIQUE: Multiplanar multisequence MRI of the head/brain was performed with and without the administration of intravenous contrast. COMPARISON: MRI head 02/21/2024. Same day CT head. CLINICAL HISTORY: Abnormal CT head, history of prior brain tumor s/p resection. FINDINGS: BRAIN AND VENTRICLES: Multiple new leptomeningeal enhancing masses in the posterior fossa. This includes : 2.5 x 1.9 cm mass along the anterior aspect of the left posterolateral leaflet at the  tentorial incisura (series 20 image 13). 1 cm enhancing mass along the posterior midline cerebellum (series 18, image 54 and series 20, image 13 ). Adjacent edema in left occipital lobe and superior cerebllum. 1 cm enhancing mass along the posterior right cerebellum (series 18 image 47). 1.7 cm enhancing mass along the inferior and anterior right cerebellum (series 18, image 29). Adjcacent cerebellar edema. 1.7 cm enhancing mass along the anterior left paramidline basal cistern anterior to the basilar artery (series 18, image 39). Adjacent cerebellar edema. No acute infarct. Remote right frontal infarct. No acute intracranial hemorrhage.  No hydrocephalus. Normal flow voids. ORBITS: No acute abnormality. SINUSES: No acute abnormality. BONES AND SOFT TISSUES: Left suboccipital craniotomy. IMPRESSION: 1. Multiple new leptomeningeal enhancing masses in the posterior fossa, detailed above and concerning for metastatic deposits given the history. 2. Adjacent brain edema in the cerebellum and occipital lobe without significant mass effect. Electronically signed by: Gilmore Molt MD 06/15/2024 08:42 PM EDT RP Workstation: [id number]   CT Cervical Spine Wo Contrast Result Date: 06/15/2024 CLINICAL DATA:  Fall EXAM: CT CERVICAL SPINE WITHOUT CONTRAST TECHNIQUE: Multidetector CT  imaging of the cervical spine was performed without intravenous contrast. Multiplanar CT image reconstructions were also generated. RADIATION DOSE REDUCTION: This exam was performed according to the departmental dose-optimization program which includes automated exposure control, adjustment of the mA and/or kV according to patient size and/or use of iterative reconstruction technique. COMPARISON:  None Available. FINDINGS: Alignment: Normal. Skull base and vertebrae: No acute fracture. No primary bone lesion or focal pathologic process. Soft tissues and spinal canal: No prevertebral fluid or swelling. No visible canal hematoma. Disc levels: Disc space narrowing and spurring most notable at C5-6 and C6-7. Moderate to advanced bilateral degenerative facet disease, right greater than left. Multilevel bilateral neural foraminal narrowing, right greater than left. Upper chest: Emphysema. Other: None IMPRESSION: Multilevel degenerative disc and facet disease. No acute bony abnormality. Electronically Signed   By: Franky Crease M.D.   On: 06/15/2024 18:22   CT Head Wo Contrast Result Date: 06/15/2024 CLINICAL DATA:  Fall, headache EXAM: CT HEAD WITHOUT CONTRAST TECHNIQUE: Contiguous axial images were obtained from the base of the skull through the vertex without intravenous contrast. RADIATION DOSE REDUCTION: This exam was performed according to the departmental dose-optimization program which includes automated exposure control, adjustment of the mA and/or kV according to patient size and/or use of iterative reconstruction technique. COMPARISON:  01/18/2024 FINDINGS: Brain: Encephalomalacia noted in the inferior left cerebellum with overlying craniotomy defect. This is in the area of prior cerebellar mass, now resected. Stable encephalomalacia in the right MCA territory. Mild atrophy. No acute infarct or hemorrhage. Slightly hyperdense mass now noted to the left of midline measuring 1.9 x 1 cm on image 17. this appears  to be dural-based along the undersurface of the left tentorium. Also, in the adjacent medial left occipital lobe, there is subcortical edema concerning for possible mass lesion. Vascular: No hyperdense vessel or unexpected calcification. Skull: No acute calvarial abnormality. Prior left lower occipital craniotomy. Sinuses/Orbits: No acute findings Other: None IMPRESSION: 1.9 x 1 cm slightly hyperdense dural-based mass noted along the undersurface of the left tentorium near the midline. There also appears to be subcortical edema in the medial left occipital lobe concerning for underlying mass lesion. Recommend further evaluation with MRI with and without contrast. Electronically Signed   By: Franky Crease M.D.   On: 06/15/2024 18:20   DG Shoulder Left Result Date: 06/15/2024 CLINICAL DATA:  Fall, left shoulder  pain EXAM: LEFT SHOULDER - 2+ VIEW COMPARISON:  None Available. FINDINGS: Degenerative changes in the [facility] joint with joint space narrowing and spurring. Glenohumeral joint is maintained. No acute bony abnormality. Specifically, no fracture, subluxation, or dislocation. Soft tissues are intact. IMPRESSION: Degenerative changes in the left AC joint. No acute bony abnormality. Electronically Signed   By: Franky Crease M.D.   On: 06/15/2024 18:11    PERFORMANCE STATUS (ECOG) : 2 - Symptomatic, <50% confined to bed  Review of Systems Unless otherwise noted, a complete review of systems is negative.  Physical Exam General: NAD Cardiovascular: regular rate and rhythm Pulmonary: clear ant fields Abdomen: soft, nontender, + bowel sounds GU: no suprapubic tenderness Extremities: no edema, no joint deformities Skin: no rashes Neurological: Weakness but otherwise nonfocal  IMPRESSION: Patient with recurrent stage IV adenocarcinoma of the right lung now with progressive brain metastasis.    Patient currently receiving XRT with plan for surveillance afterwards.  Patient recognizes that his cancer is  incurable.  He is hopeful for a positive outcome with XRT.  At baseline, patient lives at home with his wife.  He is functionally independent with his own care but has some assistance from her when needed.  Symptomatically, he endorses headaches.  No significant visual changes or weakness.  Patient has tried Norco  and tramadol, both of which have been ineffective.  Will start him on oxycodone  as needed.  Discussed and recommended bowel regimen to prevent opioid-induced constipation.  Appetite is good on steroids.  Discussed future hospice involvement. Patient wants to stay at home.   Patient has previously completed advance directives.  He would not want to be resuscitated nor have his life prolonged artificially on machines.  I signed a DNR order today for him to take home.  PLAN: - Continue current scope of treatment - Start oxycodone  5 mg every 6 hours as needed #45 - Daily bowel regimen - DNR/DNI - RTC 2 months  Case and plan discussed Dr. Jacobo   Patient expressed understanding and was in agreement with this plan. He also understands that He can call the clinic at any time with any questions, concerns, or complaints.     Time Total: 20 minutes  Visit consisted of counseling and education dealing with the complex and emotionally intense issues of symptom management and palliative care in the setting of serious and potentially life-threatening illness.Greater than 50%  of this time was spent counseling and coordinating care related to the above assessment and plan.  Signed by: [name], PhD, NP-C

## 2024-06-29 NOTE — Progress Notes (Signed)
 [facility]  [phone] [phone]  ID: Ethan Fitzgerald OB: 17-Jun-1956  MR#: [id number]  RDW#:[id number]  Patient Care Team: [name], NP as PCP - General (Nurse Practitioner) Perla Evalene PARAS, MD as PCP - Cardiology (Cardiology) Rosemarie Eather GORMAN, MD as Referring Physician (Neurology) Verdene Gills, RN as Oncology Nurse Navigator Jacobo, Evalene PARAS, MD as Consulting Physician (Oncology) Gerard Frederick, NP as Nurse Practitioner (Cardiology) Devra Lands, RN as VBCI Care Management  CHIEF COMPLAINT: Recurrent stage IV adenocarcinoma of the right upper lobe lung with brain metastasis.  INTERVAL HISTORY: Patient returns to clinic today for further evaluation and discussion of his PET scan results.  He has initiated XRT to his brain and is tolerating treatment well.  He has noted increased insomnia and mood changes with his dexamethasone  dose.  He has increased headaches as well, but no other pain or neurologic complaints.  He denies any recent fevers or illnesses. He denies any chest pain, shortness of breath, or hemoptysis.  He has a chronic cough.  He has no nausea, vomiting, constipation, or diarrhea.  He has no urinary complaints.  Patient offers no further specific complaints today.  REVIEW OF SYSTEMS:   Review of Systems  Constitutional:  Positive for malaise/fatigue. Negative for fever and weight loss.  Eyes:  Positive for blurred vision.  Respiratory:  Positive for cough. Negative for hemoptysis and shortness of breath.   Cardiovascular: Negative.  Negative for chest pain and leg swelling.  Gastrointestinal: Negative.  Negative for abdominal pain.  Genitourinary: Negative.  Negative for dysuria.  Musculoskeletal:  Positive for falls. Negative for back pain.  Skin: Negative.  Negative for rash.  Neurological:  Positive for weakness and headaches. Negative for dizziness and focal weakness.  Psychiatric/Behavioral: Negative.  The  patient is not nervous/anxious.   All other systems reviewed and are negative.   As per HPI. Otherwise, a complete review of systems is negative.  PAST MEDICAL HISTORY: Past Medical History:  Diagnosis Date   COPD (chronic obstructive pulmonary disease) (HCC)    mild   COVID-19 09/2019   Depression    Erectile dysfunction    Headache    daily, stress headaches   Knee pain, left    Seasonal allergies    Stroke (HCC)    Tobacco dependence     PAST SURGICAL HISTORY: Past Surgical History:  Procedure Laterality Date   BACK SURGERY     metal plate in back   COLONOSCOPY     COLONOSCOPY WITH PROPOFOL  N/A 04/26/2019   Procedure: COLONOSCOPY WITH PROPOFOL ;  Surgeon: Therisa Bi, MD;  Location: [facility] ENDOSCOPY;  Service: Gastroenterology;  Laterality: N/A;   FINE NEEDLE ASPIRATION  03/03/2023   Procedure: FINE NEEDLE ASPIRATION;  Surgeon: Isadora Hose, MD;  Location: [facility] ENDOSCOPY;  Service: Pulmonary;;   HERNIA REPAIR Right    IR ANGIO INTRA EXTRACRAN SEL COM CAROTID INNOMINATE UNI L MOD SED  03/27/2020   IR ANGIO VERTEBRAL SEL SUBCLAVIAN INNOMINATE UNI L MOD SED  03/27/2020   IR ANGIO VERTEBRAL SEL VERTEBRAL UNI R MOD SED  03/27/2020   IR CT HEAD LTD  03/27/2020   IR IMAGING GUIDED PORT INSERTION  03/29/2023   IR PERCUTANEOUS ART THROMBECTOMY/INFUSION INTRACRANIAL INC DIAG ANGIO  03/27/2020   KNEE ARTHROSCOPY Left 03/28/2015   Procedure: Arthroscopic partial medial meniscectomy plus chondral debridement;  Surgeon: Helayne Glenn, MD;  Location: [facility];  Service: Orthopedics;  Laterality: Left;   RADIOLOGY WITH ANESTHESIA N/A 03/27/2020  Procedure: IR WITH ANESTHESIA;  Surgeon: Dolphus Carrion, MD;  Location: [facility] OR;  Service: Radiology;  Laterality: N/A;   VIDEO BRONCHOSCOPY WITH ENDOBRONCHIAL ULTRASOUND  03/03/2023   Procedure: VIDEO BRONCHOSCOPY WITH ENDOBRONCHIAL ULTRASOUND;  Surgeon: Isadora Hose, MD;  Location: [facility] ENDOSCOPY;  Service: Pulmonary;;     FAMILY HISTORY: Family History  Problem Relation Age of Onset   Diabetes Mother    Heart attack Mother 8   Cancer Father        liver    COPD Brother    HIV/AIDS Brother    Prostate cancer Neg Hx    Colon cancer Neg Hx     ADVANCED DIRECTIVES (Y/N):  N  HEALTH MAINTENANCE: Social History   Tobacco Use   Smoking status: Former    Current packs/day: 0.00    Average packs/day: 1.5 packs/day for 49.0 years (73.5 ttl pk-yrs)    Types: Cigarettes    Start date: 62    Quit date: 09/14/2017    Years since quitting: 6.7   Smokeless tobacco: Former  Building services engineer status: Never Used  Substance Use Topics   Alcohol use: Not Currently    Comment: 6 pack on weekend   Drug use: No     Colonoscopy:  PAP:  Bone density:  Lipid panel:  No Known Allergies  Current Outpatient Medications  Medication Sig Dispense Refill   acetaminophen  (TYLENOL ) 500 MG tablet Take 2 tablets (1,000 mg total) by mouth every 6 (six) hours as needed. 100 tablet 2   albuterol  (VENTOLIN  HFA) 108 (90 Base) MCG/ACT inhaler Inhale 2 puffs into the lungs every 4 (four) hours as needed for wheezing or shortness of breath. 1 each 2   aspirin  EC 81 MG tablet Take 1 tablet (81 mg total) by mouth daily. Swallow whole.     b complex vitamins capsule Take 1 capsule by mouth daily.     baclofen  (LIORESAL ) 10 MG tablet Take 10 mg by mouth as needed for muscle spasms (has if needed for spasms back of head post surgery).     Cholecalciferol (VITAMIN D ) 50 MCG (2000 UT) tablet Take 2,000 Units by mouth daily.     dexamethasone  (DECADRON ) 2 MG tablet Take 1 tablet (2 mg total) by mouth 2 (two) times daily with a meal for 100 doses. 50 tablet 1   ezetimibe  (ZETIA ) 10 MG tablet Take 1 tablet (10 mg total) by mouth daily. 90 tablet 3   fluticasone  (FLONASE ) 50 MCG/ACT nasal spray Place 2 sprays into both nostrils daily. 48 g 3   midodrine  (PROAMATINE ) 5 MG tablet TAKE 1 TABLET BY MOUTH 3 TIMES DAILY WITH MEALS.  270 tablet 1   Multiple Vitamins-Minerals (IMMUNE SUPPORT VITAMIN C PO) Take 1 tablet by mouth daily.     oxyCODONE  (OXY IR/ROXICODONE ) 5 MG immediate release tablet Take 1 tablet (5 mg total) by mouth every 6 (six) hours as needed for severe pain (pain score 7-10). 45 tablet 0   sildenafil  (REVATIO ) 20 MG tablet TAKE 1 TO 5 TABLETS BY MOUTH ABOUT 30 MINUTES PRIOR TO SEX.START WITH 1 THEN INCREASE 30 tablet 5   STIOLTO RESPIMAT  2.5-2.5 MCG/ACT AERS INHALE 2 PUFFS EVERY DAY 12 g 3   acetaminophen  (TYLENOL ) 325 MG tablet Take 650 mg by mouth. (Patient not taking: Reported on 06/29/2024)     Multiple Vitamin (MULTIVITAMIN WITH MINERALS) TABS tablet Take 1 tablet by mouth daily. (Patient not taking: Reported on 06/29/2024)     No current facility-administered  medications for this visit.   Facility-Administered Medications Ordered in Other Visits  Medication Dose Route Frequency Provider Last Rate Last Admin   fludeoxyglucose  F - 18 (FDG) injection 8 millicurie  8 millicurie Intravenous Once PRN Mugweru, Jon, MD        OBJECTIVE: Vitals:   06/29/24 0959  BP: 116/82  Pulse: (!) 112  Resp: 18  Temp: 98 F (36.7 C)  SpO2: 100%       Body mass index is 21.95 kg/m.    ECOG FS:1 - Symptomatic but completely ambulatory,  General: Well-developed, well-nourished, no acute distress. Eyes: Pink conjunctiva, anicteric sclera. HEENT: Normocephalic, moist mucous membranes. Lungs: No audible wheezing or coughing. Heart: Regular rate and rhythm. Abdomen: Soft, nontender, no obvious distention. Musculoskeletal: No edema, cyanosis, or clubbing. Neuro: Alert, answering all questions appropriately. Cranial nerves grossly intact. Skin: No rashes or petechiae noted. Psych: Normal affect.  LAB RESULTS:  Lab Results  Component Value Date   NA 134 (L) 06/15/2024   K 3.5 06/15/2024   CL 102 06/15/2024   CO2 20 (L) 06/15/2024   GLUCOSE 96 06/15/2024   BUN 25 (H) 06/15/2024   CREATININE 1.17  06/15/2024   CALCIUM  10.0 06/15/2024   PROT 7.2 01/18/2024   ALBUMIN 3.8 01/18/2024   AST 26 01/18/2024   ALT 21 01/18/2024   ALKPHOS 76 01/18/2024   BILITOT 1.4 (H) 01/18/2024   GFRNONAA >60 06/15/2024   GFRAA 85 12/22/2020    Lab Results  Component Value Date   WBC 8.1 06/15/2024   NEUTROABS 4.6 01/11/2024   HGB 14.1 06/15/2024   HCT 41.9 06/15/2024   MCV 97.4 06/15/2024   PLT 242 06/15/2024     STUDIES: NM PET Image Restag (PS) Skull Base To Thigh Result Date: 06/27/2024 CLINICAL DATA:  Subsequent treatment strategy for lung cancer. EXAM: NUCLEAR MEDICINE PET SKULL BASE TO THIGH TECHNIQUE: 8.0 mCi F-18 FDG was injected intravenously. Full-ring PET imaging was performed from the skull base to thigh after the radiotracer. CT data was obtained and used for attenuation correction and anatomic localization. Fasting blood glucose: 117 mg/dl COMPARISON:  CT chest abdomen pelvis 01/18/2024 and PET 08/17/2023. FINDINGS: Mediastinal blood pool activity: SUV max 2.2 Liver activity: SUV max NA NECK: Hypermetabolic lesions in the posterior fossa with index lesion anterior to the brainstem, SUV max 14.1. No additional abnormal hypermetabolism. Incidental CT findings: Right temporal encephalomalacia.  Cerebral atrophy. CHEST: Right lower lobe mass straddles the upper right major fissure measures 3.2 x 4.2 cm, has decreased in size from 3.6 x 4.5 cm with minimal residual focal hypermetabolism along the medial margin, SUV max 3.6. Minimally hypermetabolic low right paratracheal lymph node measures 8 mm (SUV max 2.7. No additional abnormal hypermetabolism. Incidental CT findings: Left IJ Port-A-Cath terminates in the right atrium. Atherosclerotic calcification of the aorta and coronary arteries. Trace pericardial effusion. Centrilobular and paraseptal emphysema. ABDOMEN/PELVIS: No abnormal hypermetabolism. Incidental CT findings: Gallstones. SKELETON: No abnormal hypermetabolism. Incidental CT findings:  Postoperative changes in the lumbar spine. Degenerative changes in the spine. IMPRESSION: 1. New hypermetabolic brain metastases, as on MR brain 06/15/2024. 2. Right lower lobe mass has decreased slightly in size in the interval with only a tiny focal area of residual hypermetabolism along the medial margin. Small mildly hypermetabolic low right paratracheal lymph node. 3. Trace pericardial effusion. 4. Cholelithiasis. 5. Aortic atherosclerosis (ICD10-I70.0). Coronary artery calcification. 6.  Emphysema (ICD10-J43.9). Electronically Signed   By: Newell Eke M.D.   On: 06/27/2024 16:48   MR  Brain W and Wo Contrast Result Date: 06/15/2024 EXAM: MRI BRAIN WITH AND WITHOUT CONTRAST 06/15/2024 07:57:31 PM TECHNIQUE: Multiplanar multisequence MRI of the head/brain was performed with and without the administration of intravenous contrast. COMPARISON: MRI head 02/21/2024. Same day CT head. CLINICAL HISTORY: Abnormal CT head, history of prior brain tumor s/p resection. FINDINGS: BRAIN AND VENTRICLES: Multiple new leptomeningeal enhancing masses in the posterior fossa. This includes : 2.5 x 1.9 cm mass along the anterior aspect of the left posterolateral leaflet at the tentorial incisura (series 20 image 13). 1 cm enhancing mass along the posterior midline cerebellum (series 18, image 54 and series 20, image 13 ). Adjacent edema in left occipital lobe and superior cerebllum. 1 cm enhancing mass along the posterior right cerebellum (series 18 image 47). 1.7 cm enhancing mass along the inferior and anterior right cerebellum (series 18, image 29). Adjcacent cerebellar edema. 1.7 cm enhancing mass along the anterior left paramidline basal cistern anterior to the basilar artery (series 18, image 39). Adjacent cerebellar edema. No acute infarct. Remote right frontal infarct. No acute intracranial hemorrhage.  No hydrocephalus. Normal flow voids. ORBITS: No acute abnormality. SINUSES: No acute abnormality. BONES AND SOFT  TISSUES: Left suboccipital craniotomy. IMPRESSION: 1. Multiple new leptomeningeal enhancing masses in the posterior fossa, detailed above and concerning for metastatic deposits given the history. 2. Adjacent brain edema in the cerebellum and occipital lobe without significant mass effect. Electronically signed by: Gilmore Molt MD 06/15/2024 08:42 PM EDT RP Workstation: [id number]   CT Cervical Spine Wo Contrast Result Date: 06/15/2024 CLINICAL DATA:  Fall EXAM: CT CERVICAL SPINE WITHOUT CONTRAST TECHNIQUE: Multidetector CT imaging of the cervical spine was performed without intravenous contrast. Multiplanar CT image reconstructions were also generated. RADIATION DOSE REDUCTION: This exam was performed according to the departmental dose-optimization program which includes automated exposure control, adjustment of the mA and/or kV according to patient size and/or use of iterative reconstruction technique. COMPARISON:  None Available. FINDINGS: Alignment: Normal. Skull base and vertebrae: No acute fracture. No primary bone lesion or focal pathologic process. Soft tissues and spinal canal: No prevertebral fluid or swelling. No visible canal hematoma. Disc levels: Disc space narrowing and spurring most notable at C5-6 and C6-7. Moderate to advanced bilateral degenerative facet disease, right greater than left. Multilevel bilateral neural foraminal narrowing, right greater than left. Upper chest: Emphysema. Other: None IMPRESSION: Multilevel degenerative disc and facet disease. No acute bony abnormality. Electronically Signed   By: Franky Crease M.D.   On: 06/15/2024 18:22   CT Head Wo Contrast Result Date: 06/15/2024 CLINICAL DATA:  Fall, headache EXAM: CT HEAD WITHOUT CONTRAST TECHNIQUE: Contiguous axial images were obtained from the base of the skull through the vertex without intravenous contrast. RADIATION DOSE REDUCTION: This exam was performed according to the departmental dose-optimization program which  includes automated exposure control, adjustment of the mA and/or kV according to patient size and/or use of iterative reconstruction technique. COMPARISON:  01/18/2024 FINDINGS: Brain: Encephalomalacia noted in the inferior left cerebellum with overlying craniotomy defect. This is in the area of prior cerebellar mass, now resected. Stable encephalomalacia in the right MCA territory. Mild atrophy. No acute infarct or hemorrhage. Slightly hyperdense mass now noted to the left of midline measuring 1.9 x 1 cm on image 17. this appears to be dural-based along the undersurface of the left tentorium. Also, in the adjacent medial left occipital lobe, there is subcortical edema concerning for possible mass lesion. Vascular: No hyperdense vessel or unexpected calcification. Skull: No acute calvarial  abnormality. Prior left lower occipital craniotomy. Sinuses/Orbits: No acute findings Other: None IMPRESSION: 1.9 x 1 cm slightly hyperdense dural-based mass noted along the undersurface of the left tentorium near the midline. There also appears to be subcortical edema in the medial left occipital lobe concerning for underlying mass lesion. Recommend further evaluation with MRI with and without contrast. Electronically Signed   By: Franky Crease M.D.   On: 06/15/2024 18:20   DG Shoulder Left Result Date: 06/15/2024 CLINICAL DATA:  Fall, left shoulder pain EXAM: LEFT SHOULDER - 2+ VIEW COMPARISON:  None Available. FINDINGS: Degenerative changes in the [facility] joint with joint space narrowing and spurring. Glenohumeral joint is maintained. No acute bony abnormality. Specifically, no fracture, subluxation, or dislocation. Soft tissues are intact. IMPRESSION: Degenerative changes in the left AC joint. No acute bony abnormality. Electronically Signed   By: Franky Crease M.D.   On: 06/15/2024 18:11   ONCOLOGY HISTORY: Previously, patient was considered stage III disease and completed concurrent XRT along with weekly carboplatin  and Taxol   on May 27, 2023.  Patient initiated his year-long maintenance durvalumab  on June 08, 2023 and received his final treatment on Jan 11, 2024.  Patient did not complete the recommended year-long maintenance treatment secondary to financial concerns.  Imaging with CT scan on Jan 18, 2024 did not reveal any evidence of recurrent or progressive disease.  Initial staging MRI of the brain on February 11, 2023 did not reveal any metastatic disease, but subsequent imaging on Jan 18, 2024 revealed a new 3.1 x 2.6 cm mass in the left cerebellum.  Patient underwent surgical resection at [facility] and then adjuvant XRT completing in approximately July 2025.  Repeat MRI on February 21, 2024 did not reveal any other evidence of metastatic disease.     ASSESSMENT: Recurrent stage IV adenocarcinoma of the right upper lobe lung with brain metastasis.  PLAN:    Recurrent stage IV adenocarcinoma of the right upper lobe lung: Upon recent evaluation in the ER, repeat brain MRI on June 15, 2024 revealed multiple new leptomeningeal masses with surrounding edema.  Patient has now initiated XRT and will complete treatment in approximately 2 weeks.  Continue dexamethasone  as prescribed.  Taper as per radiation oncology recommendations.  Radiation oncology has ordered repeat MRI for mid November 2025.  Repeat PET scan on June 26, 2024 revealed no evidence of significant recurrence or progressive disease in the body.  No intervention is needed at this time.  Patient does not require systemic treatment at this time, but likely will in the future.  Molecular studies were essentially negative.  Return to clinic in 2 months with repeat imaging using CT scan and further evaluation.  Appreciate palliative care input.   Brain metastasis: Imaging and treatment as above.  He has been instructed to continue his dexamethasone .  Treatment per radiation oncology. Disposition: We previously discussed hospice and end-of-life in clinic, but  patient is not ready to make this decision.  Appreciate palliative care input. Pain: Patient was given a prescription for oxycodone  today.  I spent a total of 30 minutes reviewing chart data, face-to-face evaluation with the patient, counseling and coordination of care as detailed above.   Patient expressed understanding and was in agreement with this plan. He also understands that He can call clinic at any time with any questions, concerns, or complaints.    Cancer Staging  Adenocarcinoma of upper lobe of right lung [facility]) Staging form: Lung, AJCC 8th Edition - Clinical stage from 03/15/2023: Stage  IIIA (cT4, cN0, cM0) - Signed by Jacobo Evalene PARAS, MD on 03/15/2023 Stage prefix: Initial diagnosis   Evalene PARAS Jacobo, MD   06/29/2024 12:21 PM

## 2024-07-02 ENCOUNTER — Other Ambulatory Visit: Payer: Self-pay

## 2024-07-02 ENCOUNTER — Other Ambulatory Visit: Payer: Self-pay | Admitting: Oncology

## 2024-07-02 ENCOUNTER — Ambulatory Visit
Admission: RE | Admit: 2024-07-02 | Discharge: 2024-07-02 | Disposition: A | Discharge status: Home / Self Care | Source: Ambulatory Visit | Attending: Radiation Oncology | Admitting: Radiation Oncology

## 2024-07-02 DIAGNOSIS — Z510 Encounter for antineoplastic radiation therapy: Secondary | ICD-10-CM | POA: Diagnosis not present

## 2024-07-02 LAB — RAD ONC ARIA SESSION SUMMARY
Course Elapsed Days: 7
Plan Fractions Treated to Date: 6
Plan Prescribed Dose Per Fraction: 2 Gy
Plan Total Fractions Prescribed: 15
Plan Total Prescribed Dose: 30 Gy
Reference Point Dosage Given to Date: 12 Gy
Reference Point Session Dosage Given: 2 Gy
Session Number: 6

## 2024-07-03 ENCOUNTER — Ambulatory Visit
Admission: RE | Admit: 2024-07-03 | Discharge: 2024-07-03 | Disposition: A | Discharge status: Home / Self Care | Source: Ambulatory Visit | Attending: Radiation Oncology | Admitting: Radiation Oncology

## 2024-07-03 ENCOUNTER — Other Ambulatory Visit: Payer: Self-pay | Admitting: *Deleted

## 2024-07-03 ENCOUNTER — Other Ambulatory Visit: Payer: Self-pay

## 2024-07-03 DIAGNOSIS — Z510 Encounter for antineoplastic radiation therapy: Secondary | ICD-10-CM | POA: Diagnosis not present

## 2024-07-03 DIAGNOSIS — C3411 Malignant neoplasm of upper lobe, right bronchus or lung: Secondary | ICD-10-CM

## 2024-07-03 DIAGNOSIS — I3139 Other pericardial effusion (noninflammatory): Secondary | ICD-10-CM | POA: Diagnosis not present

## 2024-07-03 DIAGNOSIS — C7931 Secondary malignant neoplasm of brain: Secondary | ICD-10-CM

## 2024-07-03 LAB — RAD ONC ARIA SESSION SUMMARY
Course Elapsed Days: 8
Plan Fractions Treated to Date: 7
Plan Prescribed Dose Per Fraction: 2 Gy
Plan Total Fractions Prescribed: 15
Plan Total Prescribed Dose: 30 Gy
Reference Point Dosage Given to Date: 14 Gy
Reference Point Session Dosage Given: 2 Gy
Session Number: 7

## 2024-07-04 ENCOUNTER — Other Ambulatory Visit: Payer: Self-pay

## 2024-07-04 ENCOUNTER — Telehealth: Payer: Self-pay

## 2024-07-04 ENCOUNTER — Ambulatory Visit
Admission: RE | Admit: 2024-07-04 | Discharge: 2024-07-04 | Disposition: A | Discharge status: Home / Self Care | Source: Ambulatory Visit | Attending: Radiation Oncology | Admitting: Radiation Oncology

## 2024-07-04 DIAGNOSIS — Z510 Encounter for antineoplastic radiation therapy: Secondary | ICD-10-CM | POA: Diagnosis not present

## 2024-07-04 LAB — RAD ONC ARIA SESSION SUMMARY
Course Elapsed Days: 9
Plan Fractions Treated to Date: 8
Plan Prescribed Dose Per Fraction: 2 Gy
Plan Total Fractions Prescribed: 15
Plan Total Prescribed Dose: 30 Gy
Reference Point Dosage Given to Date: 16 Gy
Reference Point Session Dosage Given: 2 Gy
Session Number: 8

## 2024-07-04 NOTE — Patient Outreach (Signed)
 RNCM called for follow up visit, wife answered, currently not at home. Requested call back to reschedule when she can be with patient for visit. Wife provided update, patient with confusion, meeting with doctors and SW next week to discuss Palliative Care/Hospice. States unsure how much time patient has left. Made aware I can place referral for Palliative Care/Hospice, wife decided she will wait for Monday appointment and I will follow up with her Tuesday.

## 2024-07-05 ENCOUNTER — Telehealth: Payer: Self-pay | Admitting: *Deleted

## 2024-07-05 ENCOUNTER — Other Ambulatory Visit: Payer: Self-pay

## 2024-07-05 ENCOUNTER — Ambulatory Visit
Admission: RE | Admit: 2024-07-05 | Discharge: 2024-07-05 | Disposition: A | Discharge status: Home / Self Care | Source: Ambulatory Visit | Attending: Radiation Oncology | Admitting: Radiation Oncology

## 2024-07-05 ENCOUNTER — Inpatient Hospital Stay: Admitting: Hospice and Palliative Medicine

## 2024-07-05 DIAGNOSIS — C7931 Secondary malignant neoplasm of brain: Secondary | ICD-10-CM | POA: Diagnosis not present

## 2024-07-05 DIAGNOSIS — Z510 Encounter for antineoplastic radiation therapy: Secondary | ICD-10-CM | POA: Diagnosis not present

## 2024-07-05 LAB — RAD ONC ARIA SESSION SUMMARY
Course Elapsed Days: 10
Plan Fractions Treated to Date: 9
Plan Prescribed Dose Per Fraction: 2 Gy
Plan Total Fractions Prescribed: 15
Plan Total Prescribed Dose: 30 Gy
Reference Point Dosage Given to Date: 18 Gy
Reference Point Session Dosage Given: 2 Gy
Session Number: 9

## 2024-07-06 ENCOUNTER — Encounter: Payer: Self-pay | Admitting: Hospice and Palliative Medicine

## 2024-07-06 ENCOUNTER — Other Ambulatory Visit: Payer: Self-pay

## 2024-07-06 ENCOUNTER — Ambulatory Visit
Admission: RE | Admit: 2024-07-06 | Discharge: 2024-07-06 | Disposition: A | Discharge status: Home / Self Care | Source: Ambulatory Visit | Attending: Radiation Oncology | Admitting: Radiation Oncology

## 2024-07-06 ENCOUNTER — Inpatient Hospital Stay: Admitting: Hospice and Palliative Medicine

## 2024-07-06 VITALS — BP 135/90 | HR 73 | Temp 97.4°F | Resp 16 | Ht 70.0 in | Wt 148.9 lb

## 2024-07-06 DIAGNOSIS — J432 Centrilobular emphysema: Secondary | ICD-10-CM | POA: Diagnosis not present

## 2024-07-06 DIAGNOSIS — Z510 Encounter for antineoplastic radiation therapy: Secondary | ICD-10-CM | POA: Diagnosis not present

## 2024-07-06 DIAGNOSIS — Z87891 Personal history of nicotine dependence: Secondary | ICD-10-CM | POA: Diagnosis not present

## 2024-07-06 DIAGNOSIS — I700 Atherosclerosis of aorta: Secondary | ICD-10-CM | POA: Diagnosis not present

## 2024-07-06 DIAGNOSIS — C7931 Secondary malignant neoplasm of brain: Secondary | ICD-10-CM | POA: Diagnosis not present

## 2024-07-06 DIAGNOSIS — C3411 Malignant neoplasm of upper lobe, right bronchus or lung: Secondary | ICD-10-CM | POA: Diagnosis not present

## 2024-07-06 DIAGNOSIS — J449 Chronic obstructive pulmonary disease, unspecified: Secondary | ICD-10-CM | POA: Diagnosis not present

## 2024-07-06 DIAGNOSIS — F17210 Nicotine dependence, cigarettes, uncomplicated: Secondary | ICD-10-CM | POA: Diagnosis not present

## 2024-07-06 DIAGNOSIS — I3139 Other pericardial effusion (noninflammatory): Secondary | ICD-10-CM | POA: Diagnosis not present

## 2024-07-06 DIAGNOSIS — N529 Male erectile dysfunction, unspecified: Secondary | ICD-10-CM | POA: Diagnosis not present

## 2024-07-06 LAB — RAD ONC ARIA SESSION SUMMARY
Course Elapsed Days: 11
Plan Fractions Treated to Date: 10
Plan Prescribed Dose Per Fraction: 2 Gy
Plan Total Fractions Prescribed: 15
Plan Total Prescribed Dose: 30 Gy
Reference Point Dosage Given to Date: 20 Gy
Reference Point Session Dosage Given: 2 Gy
Session Number: 10

## 2024-07-06 NOTE — Progress Notes (Signed)
 Symptom Management Clinic [facility] at [facility] [phone] Fax:([phone]) [phone]  Patient Care Team: [name], NP as PCP - General (Nurse Practitioner) Perla Evalene PARAS, MD as PCP - Cardiology (Cardiology) Rosemarie Eather RAMAN, MD as Referring Physician (Neurology) Verdene Gills, RN as Oncology Nurse Navigator Jacobo, Evalene PARAS, MD as Consulting Physician (Oncology) Gerard Frederick, NP as Nurse Practitioner (Cardiology) Devra Lands, RN as [facility] Care Management   NAME OF PATIENT: Ethan Fitzgerald  [id number]  1955/11/04   DATE OF VISIT: 07/06/24  REASON FOR CONSULT: MALAKI KOURY is a 68 y.o. male with multiple medical problems including stage IV adenocarcinoma of the right lung now with brain metastasis.  He previously completed concurrent XRT and CarboTaxol chemotherapy and then was treated with maintenance Durvalumab  (final treatment on Jan 11, 2024).  Patient underwent surgical resection of isolated brain met at [facility] in July 2025.  Now with progressive leptomeningeal masses.   INTERVAL HISTORY: Patient on treatment with XRT.  Presents to [facility] for management of ongoing headaches.  Patient reports that he has not had intermittent headaches.  He is taking oxycodone , which helps, but he is taking this primarily at night to help him sleep and counteract the effects of insomnia from dexamethasone .  However, today, patient says that he does not currently have a headache.  He denies any other neurological changes weakness, neuropathies, etc.  Denies recent fevers or illnesses. Denies any easy bleeding or bruising. Reports good appetite and denies weight loss. Denies chest pain. Denies any nausea, vomiting, constipation, or diarrhea. Denies urinary complaints. Patient offers no further specific complaints today.   PAST MEDICAL HISTORY: Past Medical History:  Diagnosis Date   COPD (chronic obstructive pulmonary disease) (HCC)    mild    COVID-19 09/2019   Depression    Erectile dysfunction    Headache    daily, stress headaches   Knee pain, left    Seasonal allergies    Stroke (HCC)    Tobacco dependence     PAST SURGICAL HISTORY:  Past Surgical History:  Procedure Laterality Date   BACK SURGERY     metal plate in back   COLONOSCOPY     COLONOSCOPY WITH PROPOFOL  N/A 04/26/2019   Procedure: COLONOSCOPY WITH PROPOFOL ;  Surgeon: Therisa Bi, MD;  Location: [facility] ENDOSCOPY;  Service: Gastroenterology;  Laterality: N/A;   FINE NEEDLE ASPIRATION  03/03/2023   Procedure: FINE NEEDLE ASPIRATION;  Surgeon: Isadora Hose, MD;  Location: [facility] ENDOSCOPY;  Service: Pulmonary;;   HERNIA REPAIR Right    IR ANGIO INTRA EXTRACRAN SEL COM CAROTID INNOMINATE UNI L MOD SED  03/27/2020   IR ANGIO VERTEBRAL SEL SUBCLAVIAN INNOMINATE UNI L MOD SED  03/27/2020   IR ANGIO VERTEBRAL SEL VERTEBRAL UNI R MOD SED  03/27/2020   IR CT HEAD LTD  03/27/2020   IR IMAGING GUIDED PORT INSERTION  03/29/2023   IR PERCUTANEOUS ART THROMBECTOMY/INFUSION INTRACRANIAL INC DIAG ANGIO  03/27/2020   KNEE ARTHROSCOPY Left 03/28/2015   Procedure: Arthroscopic partial medial meniscectomy plus chondral debridement;  Surgeon: Helayne Glenn, MD;  Location: [facility];  Service: Orthopedics;  Laterality: Left;   RADIOLOGY WITH ANESTHESIA N/A 03/27/2020   Procedure: IR WITH ANESTHESIA;  Surgeon: Dolphus Carrion, MD;  Location: [facility] OR;  Service: Radiology;  Laterality: N/A;   VIDEO BRONCHOSCOPY WITH ENDOBRONCHIAL ULTRASOUND  03/03/2023   Procedure: VIDEO BRONCHOSCOPY WITH ENDOBRONCHIAL ULTRASOUND;  Surgeon: Isadora Hose, MD;  Location: [facility] ENDOSCOPY;  Service: Pulmonary;;  HEMATOLOGY/ONCOLOGY HISTORY:  Oncology History  Adenocarcinoma of upper lobe of right lung (HCC)  03/15/2023 Initial Diagnosis   Adenocarcinoma of upper lobe of right lung (HCC)   03/15/2023 Cancer Staging   Staging form: Lung, AJCC 8th Edition - Clinical stage from 03/15/2023:  Stage IIIA (cT4, cN0, cM0) - Signed by Jacobo Evalene PARAS, MD on 03/15/2023 Stage prefix: Initial diagnosis   04/05/2023 - 05/25/2023 Chemotherapy   Patient is on Treatment Plan : LUNG Carboplatin  + Paclitaxel  + XRT q7d     06/08/2023 - 01/11/2024 Chemotherapy   Patient is on Treatment Plan : LUNG Durvalumab  (10) q14d       ALLERGIES:  has no known allergies.  MEDICATIONS:  Current Outpatient Medications  Medication Sig Dispense Refill   acetaminophen  (TYLENOL ) 325 MG tablet Take 650 mg by mouth. (Patient not taking: Reported on 06/29/2024)     acetaminophen  (TYLENOL ) 500 MG tablet Take 2 tablets (1,000 mg total) by mouth every 6 (six) hours as needed. 100 tablet 2   albuterol  (VENTOLIN  HFA) 108 (90 Base) MCG/ACT inhaler Inhale 2 puffs into the lungs every 4 (four) hours as needed for wheezing or shortness of breath. 1 each 2   aspirin  EC 81 MG tablet Take 1 tablet (81 mg total) by mouth daily. Swallow whole.     b complex vitamins capsule Take 1 capsule by mouth daily.     baclofen  (LIORESAL ) 10 MG tablet Take 10 mg by mouth as needed for muscle spasms (has if needed for spasms back of head post surgery).     Cholecalciferol (VITAMIN D ) 50 MCG (2000 UT) tablet Take 2,000 Units by mouth daily.     dexamethasone  (DECADRON ) 2 MG tablet Take 1 tablet (2 mg total) by mouth 2 (two) times daily with a meal for 100 doses. 50 tablet 1   ezetimibe  (ZETIA ) 10 MG tablet Take 1 tablet (10 mg total) by mouth daily. 90 tablet 3   fluticasone  (FLONASE ) 50 MCG/ACT nasal spray Place 2 sprays into both nostrils daily. 48 g 3   midodrine  (PROAMATINE ) 5 MG tablet TAKE 1 TABLET BY MOUTH 3 TIMES DAILY WITH MEALS. 270 tablet 1   Multiple Vitamin (MULTIVITAMIN WITH MINERALS) TABS tablet Take 1 tablet by mouth daily. (Patient not taking: Reported on 06/29/2024)     Multiple Vitamins-Minerals (IMMUNE SUPPORT VITAMIN C PO) Take 1 tablet by mouth daily.     oxyCODONE  (OXY IR/ROXICODONE ) 5 MG immediate release tablet Take  1 tablet (5 mg total) by mouth every 6 (six) hours as needed for severe pain (pain score 7-10). 45 tablet 0   sildenafil  (REVATIO ) 20 MG tablet TAKE 1 TO 5 TABLETS BY MOUTH ABOUT 30 MINUTES PRIOR TO SEX.START WITH 1 THEN INCREASE 30 tablet 5   STIOLTO RESPIMAT  2.5-2.5 MCG/ACT AERS INHALE 2 PUFFS EVERY DAY 12 g 3   No current facility-administered medications for this visit.    VITAL SIGNS: There were no vitals taken for this visit. There were no vitals filed for this visit.  Estimated body mass index is 21.95 kg/m as calculated from the following:   Height as of 06/29/24: 5' 10 (1.778 m).   Weight as of 06/29/24: 153 lb (69.4 kg).  LABS: CBC:    Component Value Date/Time   WBC 8.1 06/15/2024 1710   HGB 14.1 06/15/2024 1710   HGB 14.2 03/12/2024 1016   HCT 41.9 06/15/2024 1710   PLT 242 06/15/2024 1710   PLT 194 03/12/2024 1016   MCV 97.4 06/15/2024 1710  NEUTROABS 4.6 01/11/2024 0748   LYMPHSABS 1.5 01/11/2024 0748   MONOABS 0.7 01/11/2024 0748   EOSABS 0.6 (H) 01/11/2024 0748   BASOSABS 0.1 01/11/2024 0748   Comprehensive Metabolic Panel:    Component Value Date/Time   NA 134 (L) 06/15/2024 1710   K 3.5 06/15/2024 1710   CL 102 06/15/2024 1710   CO2 20 (L) 06/15/2024 1710   BUN 25 (H) 06/15/2024 1710   CREATININE 1.17 06/15/2024 1710   CREATININE 1.23 01/11/2024 0748   CREATININE 0.92 12/22/2022 0808   GLUCOSE 96 06/15/2024 1710   CALCIUM  10.0 06/15/2024 1710   AST 26 01/18/2024 1719   AST 31 01/11/2024 0748   ALT 21 01/18/2024 1719   ALT 23 01/11/2024 0748   ALKPHOS 76 01/18/2024 1719   BILITOT 1.4 (H) 01/18/2024 1719   BILITOT 0.7 01/11/2024 0748   PROT 7.2 01/18/2024 1719   ALBUMIN 3.8 01/18/2024 1719    RADIOGRAPHIC STUDIES: NM PET Image Restag (PS) Skull Base To Thigh Result Date: 06/27/2024 CLINICAL DATA:  Subsequent treatment strategy for lung cancer. EXAM: NUCLEAR MEDICINE PET SKULL BASE TO THIGH TECHNIQUE: 8.0 mCi F-18 FDG was injected  intravenously. Full-ring PET imaging was performed from the skull base to thigh after the radiotracer. CT data was obtained and used for attenuation correction and anatomic localization. Fasting blood glucose: 117 mg/dl COMPARISON:  CT chest abdomen pelvis 01/18/2024 and PET 08/17/2023. FINDINGS: Mediastinal blood pool activity: SUV max 2.2 Liver activity: SUV max NA NECK: Hypermetabolic lesions in the posterior fossa with index lesion anterior to the brainstem, SUV max 14.1. No additional abnormal hypermetabolism. Incidental CT findings: Right temporal encephalomalacia.  Cerebral atrophy. CHEST: Right lower lobe mass straddles the upper right major fissure measures 3.2 x 4.2 cm, has decreased in size from 3.6 x 4.5 cm with minimal residual focal hypermetabolism along the medial margin, SUV max 3.6. Minimally hypermetabolic low right paratracheal lymph node measures 8 mm (SUV max 2.7. No additional abnormal hypermetabolism. Incidental CT findings: Left IJ Port-A-Cath terminates in the right atrium. Atherosclerotic calcification of the aorta and coronary arteries. Trace pericardial effusion. Centrilobular and paraseptal emphysema. ABDOMEN/PELVIS: No abnormal hypermetabolism. Incidental CT findings: Gallstones. SKELETON: No abnormal hypermetabolism. Incidental CT findings: Postoperative changes in the lumbar spine. Degenerative changes in the spine. IMPRESSION: 1. New hypermetabolic brain metastases, as on MR brain 06/15/2024. 2. Right lower lobe mass has decreased slightly in size in the interval with only a tiny focal area of residual hypermetabolism along the medial margin. Small mildly hypermetabolic low right paratracheal lymph node. 3. Trace pericardial effusion. 4. Cholelithiasis. 5. Aortic atherosclerosis (ICD10-I70.0). Coronary artery calcification. 6.  Emphysema (ICD10-J43.9). Electronically Signed   By: Newell Eke M.D.   On: 06/27/2024 16:48   MR Brain W and Wo Contrast Result Date:  06/15/2024 EXAM: MRI BRAIN WITH AND WITHOUT CONTRAST 06/15/2024 07:57:31 PM TECHNIQUE: Multiplanar multisequence MRI of the head/brain was performed with and without the administration of intravenous contrast. COMPARISON: MRI head 02/21/2024. Same day CT head. CLINICAL HISTORY: Abnormal CT head, history of prior brain tumor s/p resection. FINDINGS: BRAIN AND VENTRICLES: Multiple new leptomeningeal enhancing masses in the posterior fossa. This includes : 2.5 x 1.9 cm mass along the anterior aspect of the left posterolateral leaflet at the tentorial incisura (series 20 image 13). 1 cm enhancing mass along the posterior midline cerebellum (series 18, image 54 and series 20, image 13 ). Adjacent edema in left occipital lobe and superior cerebllum. 1 cm enhancing mass along the posterior right  cerebellum (series 18 image 47). 1.7 cm enhancing mass along the inferior and anterior right cerebellum (series 18, image 29). Adjcacent cerebellar edema. 1.7 cm enhancing mass along the anterior left paramidline basal cistern anterior to the basilar artery (series 18, image 39). Adjacent cerebellar edema. No acute infarct. Remote right frontal infarct. No acute intracranial hemorrhage.  No hydrocephalus. Normal flow voids. ORBITS: No acute abnormality. SINUSES: No acute abnormality. BONES AND SOFT TISSUES: Left suboccipital craniotomy. IMPRESSION: 1. Multiple new leptomeningeal enhancing masses in the posterior fossa, detailed above and concerning for metastatic deposits given the history. 2. Adjacent brain edema in the cerebellum and occipital lobe without significant mass effect. Electronically signed by: Gilmore Molt MD 06/15/2024 08:42 PM EDT RP Workstation: [id number]   CT Cervical Spine Wo Contrast Result Date: 06/15/2024 CLINICAL DATA:  Fall EXAM: CT CERVICAL SPINE WITHOUT CONTRAST TECHNIQUE: Multidetector CT imaging of the cervical spine was performed without intravenous contrast. Multiplanar CT image  reconstructions were also generated. RADIATION DOSE REDUCTION: This exam was performed according to the departmental dose-optimization program which includes automated exposure control, adjustment of the mA and/or kV according to patient size and/or use of iterative reconstruction technique. COMPARISON:  None Available. FINDINGS: Alignment: Normal. Skull base and vertebrae: No acute fracture. No primary bone lesion or focal pathologic process. Soft tissues and spinal canal: No prevertebral fluid or swelling. No visible canal hematoma. Disc levels: Disc space narrowing and spurring most notable at C5-6 and C6-7. Moderate to advanced bilateral degenerative facet disease, right greater than left. Multilevel bilateral neural foraminal narrowing, right greater than left. Upper chest: Emphysema. Other: None IMPRESSION: Multilevel degenerative disc and facet disease. No acute bony abnormality. Electronically Signed   By: Franky Crease M.D.   On: 06/15/2024 18:22   CT Head Wo Contrast Result Date: 06/15/2024 CLINICAL DATA:  Fall, headache EXAM: CT HEAD WITHOUT CONTRAST TECHNIQUE: Contiguous axial images were obtained from the base of the skull through the vertex without intravenous contrast. RADIATION DOSE REDUCTION: This exam was performed according to the departmental dose-optimization program which includes automated exposure control, adjustment of the mA and/or kV according to patient size and/or use of iterative reconstruction technique. COMPARISON:  01/18/2024 FINDINGS: Brain: Encephalomalacia noted in the inferior left cerebellum with overlying craniotomy defect. This is in the area of prior cerebellar mass, now resected. Stable encephalomalacia in the right MCA territory. Mild atrophy. No acute infarct or hemorrhage. Slightly hyperdense mass now noted to the left of midline measuring 1.9 x 1 cm on image 17. this appears to be dural-based along the undersurface of the left tentorium. Also, in the adjacent medial  left occipital lobe, there is subcortical edema concerning for possible mass lesion. Vascular: No hyperdense vessel or unexpected calcification. Skull: No acute calvarial abnormality. Prior left lower occipital craniotomy. Sinuses/Orbits: No acute findings Other: None IMPRESSION: 1.9 x 1 cm slightly hyperdense dural-based mass noted along the undersurface of the left tentorium near the midline. There also appears to be subcortical edema in the medial left occipital lobe concerning for underlying mass lesion. Recommend further evaluation with MRI with and without contrast. Electronically Signed   By: Franky Crease M.D.   On: 06/15/2024 18:20   DG Shoulder Left Result Date: 06/15/2024 CLINICAL DATA:  Fall, left shoulder pain EXAM: LEFT SHOULDER - 2+ VIEW COMPARISON:  None Available. FINDINGS: Degenerative changes in the [facility] joint with joint space narrowing and spurring. Glenohumeral joint is maintained. No acute bony abnormality. Specifically, no fracture, subluxation, or dislocation. Soft tissues are intact.  IMPRESSION: Degenerative changes in the left AC joint. No acute bony abnormality. Electronically Signed   By: Franky Crease M.D.   On: 06/15/2024 18:11    PERFORMANCE STATUS (ECOG) : 1 - Symptomatic but completely ambulatory  Review of Systems Unless otherwise noted, a complete review of systems is negative.  Physical Exam General: NAD Cardiovascular: regular rate and rhythm Pulmonary: clear ant fields Abdomen: soft, nontender, + bowel sounds GU: no suprapubic tenderness Extremities: no edema, no joint deformities Skin: no rashes Neurological: Weakness but otherwise nonfocal  IMPRESSION/PLAN: Stage IV NSCLC with brain metastasis -on treatment with XRT  Headache -patient currently denies any symptoms.  However, we discussed possible future liberalization of oxycodone  and/or dexamethasone  if needed.  Case and plan discussed with Dr. Rebbecca and   Patient expressed understanding and was in  agreement with this plan. He also understands that He can call clinic at any time with any questions, concerns, or complaints.   Thank you for allowing me to participate in the care of this very pleasant patient.   Time Total: 15 minutes  Visit consisted of counseling and education dealing with the complex and emotionally intense issues of symptom management in the setting of serious illness.Greater than 50%  of this time was spent counseling and coordinating care related to the above assessment and plan.  Signed by: [name], PhD, NP-C

## 2024-07-06 NOTE — Telephone Encounter (Signed)
 07/05/2024 02:35 PM EDT by Purcell Reena GRADE, RN  Outgoing Eshleman,Joan (EC) [phone] ([place])   The patient was here today for radiation and he was havig pain ,and the oxycodone  not working, and want to talk about the dexamethasone  with [facility] NP . I asked them to come in tomorrow at 8:30, wife says they will do it

## 2024-07-09 ENCOUNTER — Other Ambulatory Visit: Payer: Self-pay | Admitting: *Deleted

## 2024-07-09 ENCOUNTER — Ambulatory Visit
Admission: RE | Admit: 2024-07-09 | Discharge: 2024-07-09 | Disposition: A | Discharge status: Home / Self Care | Source: Ambulatory Visit | Attending: Radiation Oncology | Admitting: Radiation Oncology

## 2024-07-09 ENCOUNTER — Other Ambulatory Visit: Payer: Self-pay

## 2024-07-09 DIAGNOSIS — C7931 Secondary malignant neoplasm of brain: Secondary | ICD-10-CM | POA: Diagnosis not present

## 2024-07-09 DIAGNOSIS — C3411 Malignant neoplasm of upper lobe, right bronchus or lung: Secondary | ICD-10-CM | POA: Insufficient documentation

## 2024-07-09 DIAGNOSIS — Z510 Encounter for antineoplastic radiation therapy: Secondary | ICD-10-CM | POA: Diagnosis not present

## 2024-07-09 DIAGNOSIS — Z87891 Personal history of nicotine dependence: Secondary | ICD-10-CM | POA: Diagnosis not present

## 2024-07-09 LAB — RAD ONC ARIA SESSION SUMMARY
Course Elapsed Days: 14
Plan Fractions Treated to Date: 11
Plan Prescribed Dose Per Fraction: 2 Gy
Plan Total Fractions Prescribed: 15
Plan Total Prescribed Dose: 30 Gy
Reference Point Dosage Given to Date: 22 Gy
Reference Point Session Dosage Given: 2 Gy
Session Number: 11

## 2024-07-09 MED ORDER — OXYCODONE HCL 5 MG PO TABS: 5.0000 mg | ORAL_TABLET | Freq: Four times a day (QID) | ORAL | 0 refills | Status: DC | PRN

## 2024-07-10 ENCOUNTER — Emergency Department

## 2024-07-10 ENCOUNTER — Ambulatory Visit

## 2024-07-10 ENCOUNTER — Observation Stay
Admission: EM | Admit: 2024-07-10 | Discharge: 2024-07-12 | Disposition: A | Discharge status: Home / Self Care | Attending: Student in an Organized Health Care Education/Training Program | Admitting: Student in an Organized Health Care Education/Training Program

## 2024-07-10 ENCOUNTER — Other Ambulatory Visit: Payer: Self-pay

## 2024-07-10 DIAGNOSIS — R059 Cough, unspecified: Secondary | ICD-10-CM | POA: Diagnosis not present

## 2024-07-10 DIAGNOSIS — R627 Adult failure to thrive: Secondary | ICD-10-CM | POA: Diagnosis not present

## 2024-07-10 DIAGNOSIS — M4312 Spondylolisthesis, cervical region: Secondary | ICD-10-CM | POA: Diagnosis not present

## 2024-07-10 DIAGNOSIS — S0990XA Unspecified injury of head, initial encounter: Secondary | ICD-10-CM | POA: Diagnosis not present

## 2024-07-10 DIAGNOSIS — Z7982 Long term (current) use of aspirin: Secondary | ICD-10-CM | POA: Insufficient documentation

## 2024-07-10 DIAGNOSIS — C7989 Secondary malignant neoplasm of other specified sites: Secondary | ICD-10-CM | POA: Diagnosis not present

## 2024-07-10 DIAGNOSIS — I959 Hypotension, unspecified: Secondary | ICD-10-CM | POA: Insufficient documentation

## 2024-07-10 DIAGNOSIS — Z79899 Other long term (current) drug therapy: Secondary | ICD-10-CM | POA: Insufficient documentation

## 2024-07-10 DIAGNOSIS — G936 Cerebral edema: Secondary | ICD-10-CM | POA: Diagnosis not present

## 2024-07-10 DIAGNOSIS — C3491 Malignant neoplasm of unspecified part of right bronchus or lung: Secondary | ICD-10-CM | POA: Diagnosis not present

## 2024-07-10 DIAGNOSIS — W19XXXA Unspecified fall, initial encounter: Secondary | ICD-10-CM | POA: Diagnosis not present

## 2024-07-10 DIAGNOSIS — J449 Chronic obstructive pulmonary disease, unspecified: Secondary | ICD-10-CM | POA: Insufficient documentation

## 2024-07-10 DIAGNOSIS — C3411 Malignant neoplasm of upper lobe, right bronchus or lung: Secondary | ICD-10-CM | POA: Insufficient documentation

## 2024-07-10 DIAGNOSIS — C3490 Malignant neoplasm of unspecified part of unspecified bronchus or lung: Secondary | ICD-10-CM

## 2024-07-10 DIAGNOSIS — M47812 Spondylosis without myelopathy or radiculopathy, cervical region: Secondary | ICD-10-CM | POA: Diagnosis not present

## 2024-07-10 DIAGNOSIS — C7931 Secondary malignant neoplasm of brain: Secondary | ICD-10-CM | POA: Insufficient documentation

## 2024-07-10 DIAGNOSIS — M4802 Spinal stenosis, cervical region: Secondary | ICD-10-CM | POA: Diagnosis not present

## 2024-07-10 DIAGNOSIS — R531 Weakness: Secondary | ICD-10-CM | POA: Diagnosis not present

## 2024-07-10 DIAGNOSIS — S199XXA Unspecified injury of neck, initial encounter: Secondary | ICD-10-CM | POA: Diagnosis not present

## 2024-07-10 DIAGNOSIS — J439 Emphysema, unspecified: Secondary | ICD-10-CM | POA: Diagnosis not present

## 2024-07-10 DIAGNOSIS — R220 Localized swelling, mass and lump, head: Secondary | ICD-10-CM | POA: Diagnosis not present

## 2024-07-10 DIAGNOSIS — S0993XA Unspecified injury of face, initial encounter: Secondary | ICD-10-CM | POA: Diagnosis not present

## 2024-07-10 LAB — COMPREHENSIVE METABOLIC PANEL WITH GFR
ALT: 28 U/L (ref 0–44)
AST: 19 U/L (ref 15–41)
Albumin: 3.5 g/dL (ref 3.5–5.0)
Alkaline Phosphatase: 101 U/L (ref 38–126)
Anion gap: 12 (ref 5–15)
BUN: 28 mg/dL — ABNORMAL HIGH (ref 8–23)
CO2: 26 mmol/L (ref 22–32)
Calcium: 9.8 mg/dL (ref 8.9–10.3)
Chloride: 102 mmol/L (ref 98–111)
Creatinine, Ser: 0.99 mg/dL (ref 0.61–1.24)
GFR, Estimated: >60 mL/min (ref >60)
Glucose, Bld: 89 mg/dL (ref 70–99)
Potassium: 3.9 mmol/L (ref 3.5–5.1)
Sodium: 140 mmol/L (ref 135–145)
Total Bilirubin: 1 mg/dL (ref 0.0–1.2)
Total Protein: 7.7 g/dL (ref 6.5–8.1)

## 2024-07-10 LAB — CBC WITH DIFFERENTIAL/PLATELET
Abs Immature Granulocytes: 0.44 K/uL — ABNORMAL HIGH (ref 0.00–0.07)
Basophils Absolute: 0.1 K/uL (ref 0.0–0.1)
Basophils Relative: 1 %
Eosinophils Absolute: 0.3 K/uL (ref 0.0–0.5)
Eosinophils Relative: 3 %
HCT: 45.1 % (ref 39.0–52.0)
Hemoglobin: 14.9 g/dL (ref 13.0–17.0)
Immature Granulocytes: 5 %
Lymphocytes Relative: 12 %
Lymphs Abs: 1.1 K/uL (ref 0.7–4.0)
MCH: 32.7 pg (ref 26.0–34.0)
MCHC: 33 g/dL (ref 30.0–36.0)
MCV: 99.1 fL (ref 80.0–100.0)
Monocytes Absolute: 0.4 K/uL (ref 0.1–1.0)
Monocytes Relative: 5 %
Neutro Abs: 6.7 K/uL (ref 1.7–7.7)
Neutrophils Relative %: 74 %
Platelets: 188 K/uL (ref 150–400)
RBC: 4.55 MIL/uL (ref 4.22–5.81)
RDW: 14.5 % (ref 11.5–15.5)
WBC: 9 K/uL (ref 4.0–10.5)
nRBC: 0 % (ref 0.0–0.2)

## 2024-07-10 LAB — TROPONIN I (HIGH SENSITIVITY)
Troponin I (High Sensitivity): 3 ng/L (ref <18)
Troponin I (High Sensitivity): 5 ng/L (ref <18)

## 2024-07-10 LAB — URINALYSIS, ROUTINE W REFLEX MICROSCOPIC
Bilirubin Urine: NEGATIVE
Glucose, UA: NEGATIVE mg/dL
Hgb urine dipstick: NEGATIVE
Ketones, ur: NEGATIVE mg/dL
Leukocytes,Ua: NEGATIVE
Nitrite: NEGATIVE
Protein, ur: NEGATIVE mg/dL
Specific Gravity, Urine: 1.02 (ref 1.005–1.030)
pH: 6 (ref 5.0–8.0)

## 2024-07-10 LAB — RESP PANEL BY RT-PCR (RSV, FLU A&B, COVID)  RVPGX2
Influenza A by PCR: NEGATIVE
Influenza B by PCR: NEGATIVE
Resp Syncytial Virus by PCR: NEGATIVE
SARS Coronavirus 2 by RT PCR: NEGATIVE

## 2024-07-10 LAB — CK: Total CK: 31 U/L — ABNORMAL LOW (ref 49–397)

## 2024-07-10 MED ORDER — OXYCODONE HCL 5 MG PO TABS
5.0000 mg | ORAL_TABLET | Freq: Four times a day (QID) | ORAL | Status: DC | PRN
  Administered 2024-07-10 – 2024-07-11 (×2): 5 mg via ORAL
  Filled 2024-07-10 (×2): qty 1

## 2024-07-10 MED ORDER — SODIUM CHLORIDE 0.9 % IV BOLUS
1000.0000 mL | Freq: Once | INTRAVENOUS | Status: AC
  Administered 2024-07-10: 1000 mL via INTRAVENOUS

## 2024-07-10 MED ORDER — ONDANSETRON HCL 4 MG PO TABS: 4.0000 mg | ORAL_TABLET | Freq: Four times a day (QID) | ORAL | Status: DC | PRN

## 2024-07-10 MED ORDER — DEXAMETHASONE 4 MG PO TABS
4.0000 mg | ORAL_TABLET | Freq: Two times a day (BID) | ORAL | Status: DC
  Administered 2024-07-11 – 2024-07-12 (×3): 4 mg via ORAL
  Filled 2024-07-10 (×5): qty 1

## 2024-07-10 MED ORDER — ACETAMINOPHEN 500 MG PO TABS: 1000.0000 mg | ORAL_TABLET | Freq: Four times a day (QID) | ORAL | Status: DC | PRN

## 2024-07-10 MED ORDER — ARFORMOTEROL TARTRATE 15 MCG/2ML IN NEBU
15.0000 ug | INHALATION_SOLUTION | Freq: Two times a day (BID) | RESPIRATORY_TRACT | Status: DC
  Administered 2024-07-11 – 2024-07-12 (×3): 15 ug via RESPIRATORY_TRACT
  Filled 2024-07-10 (×5): qty 2

## 2024-07-10 MED ORDER — BISACODYL 5 MG PO TBEC: 5.0000 mg | DELAYED_RELEASE_TABLET | Freq: Every day | ORAL | Status: DC | PRN

## 2024-07-10 MED ORDER — MIDODRINE HCL 5 MG PO TABS
5.0000 mg | ORAL_TABLET | Freq: Three times a day (TID) | ORAL | Status: DC
  Administered 2024-07-10 – 2024-07-12 (×6): 5 mg via ORAL
  Filled 2024-07-10 (×6): qty 1

## 2024-07-10 MED ORDER — ALBUTEROL SULFATE (2.5 MG/3ML) 0.083% IN NEBU
3.0000 mL | INHALATION_SOLUTION | RESPIRATORY_TRACT | Status: DC | PRN
Start: 2024-07-10 — End: 2024-07-12

## 2024-07-10 MED ORDER — UMECLIDINIUM BROMIDE 62.5 MCG/ACT IN AEPB
1.0000 | INHALATION_SPRAY | Freq: Every day | RESPIRATORY_TRACT | Status: DC
  Administered 2024-07-11 – 2024-07-12 (×2): 1 via RESPIRATORY_TRACT
  Filled 2024-07-10: qty 7

## 2024-07-10 MED ORDER — ONDANSETRON HCL 4 MG/2ML IJ SOLN: 4.0000 mg | Freq: Four times a day (QID) | INTRAMUSCULAR | Status: DC | PRN

## 2024-07-10 MED ORDER — POLYETHYLENE GLYCOL 3350 17 G PO PACK
17.0000 g | PACK | Freq: Every day | ORAL | Status: DC
  Administered 2024-07-11 – 2024-07-12 (×2): 17 g via ORAL
  Filled 2024-07-10 (×2): qty 1

## 2024-07-10 NOTE — ED Notes (Signed)
 Called CCMD to add pt to monitoring.

## 2024-07-10 NOTE — ED Provider Notes (Signed)
 [facility] Provider Note    Event Date/Time   First MD Initiated Contact with Patient 07/10/24 1133     (approximate)   History   Weakness   HPI  Ethan Fitzgerald is a 68 y.o. male with adenocarcinoma of the lung that is metastatic in nature with prior brain cancer status post removal prior subarachnoid hemorrhage, anemia, orthostatic hypotension who comes in with concerns for weakness.  I reviewed the note where patient was seen on 10/10 and had MRI showing new metastatic disease in the brain.  They recommended giving 10 of Decadron  and starting 2 mg Decadron  p.o. twice daily after  I reviewed the note from 10/31 by [name].  He is currently getting whole brain radiation.  Patient reports that he is currently on steroids.  He reports that he has had gradually worsening weakness but today was the first day where he could not get up out of bed.  He really denies any chest pain or shortness of breath.  He reports having a chronic cough but does not really feel he gets much different.  He reports every time he tried to sit back up he would just fall back into the bed as he was just feeling so weak in nature.  He did report a fall on Friday.  He states that he did not come to the emergency room but he does have bruising around the left eye.  He denies any abdominal pain, urinary symptoms, fevers or other concerns.  Physical Exam   Triage Vital Signs: ED Triage Vitals  Encounter Vitals Group     BP      Girls Systolic BP Percentile      Girls Diastolic BP Percentile      Boys Systolic BP Percentile      Boys Diastolic BP Percentile      Pulse      Resp      Temp      Temp src      SpO2      Weight      Height      Head Circumference      Peak Flow      Pain Score      Pain Loc      Pain Education      Exclude from Growth Chart     Most recent vital signs: Vitals:   07/10/24 1139  BP: 113/86  Pulse: 83  Resp: 15  Temp: (!) 96.7 F (35.9  C)  SpO2: 100%     General: Awake, no distress.  CV:  Good peripheral perfusion.  Resp:  Normal effort.  Abd:  No distention.  Soft and nontender Other:  No swelling in legs no calf tenderness Patient has some slight facial droop noted on the left but patient reports that is baseline.  Otherwise cranial nerves appear intact.  He does have some bruising around the left eye.   ED Results / Procedures / Treatments   Labs (all labs ordered are listed, but only abnormal results are displayed) Labs Reviewed  RESP PANEL BY RT-PCR (RSV, FLU A&B, COVID)  [id number]  CBC WITH DIFFERENTIAL/PLATELET  COMPREHENSIVE METABOLIC PANEL WITH GFR  URINALYSIS, ROUTINE W REFLEX MICROSCOPIC  CK  TROPONIN I (HIGH SENSITIVITY)     EKG  My interpretation of EKG:  Normal sinus rhythm 79 without any ST elevation or T wave versions, normal intervals  RADIOLOGY I have reviewed the xray personally and interpreted no focal  pneumonia   PROCEDURES:  Critical Care performed: No  .1-3 Lead EKG Interpretation  Performed by: Ernest Ronal BRAVO, MD Authorized by: Ernest Ronal BRAVO, MD     Interpretation: normal     ECG rate:  80   ECG rate assessment: normal     Rhythm: sinus rhythm     Ectopy: none     Conduction: normal      MEDICATIONS ORDERED IN ED: Medications  sodium chloride  0.9 % bolus 1,000 mL (0 mLs Intravenous Stopped 07/10/24 1343)     IMPRESSION / MDM / ASSESSMENT AND PLAN / ED COURSE  I reviewed the triage vital signs and the nursing notes.   Patient's presentation is most consistent with acute presentation with potential threat to life or bodily function.   Patient comes in with concerns for weakness.  Will get workup to evaluate for Electra abnormalities, AKI, UTI, COVID, and flu.  Given recent head trauma we will get CT imaging evaluate for any intracranial hemorrhage, cervical fracture, facial fracture.   CT head negative does show known metastatic disease with slightly increased  in nature and mild edema noted CT cervical is negative CT face is negative  COVID is negative CK negative CBC reassuring CMP reassuring slight elevated BUN and patient got some IV fluids troponin was negative.  Chest x-ray with emphysema.   Attempted to stand patient up and he was still unable to ambulate.  He does had a worsening cough according to the wife.  Will add on viral panel.  Patient is too weak to go home.  Discussed with the case with Dr. B from oncology and will admit for concern for failure to thrive.  I do think patient could benefit from palliative evaluation as well.  Patient is able to lift both legs up off the bed so it seems less likely a cord compression picture however I will get them to do a postvoid bladder scan to ensure no evidence of retention that would require MRIs.  3:52 PM reevaluated patient he denies any numbness or tingling.  No saddle anesthesia no urinary or rectal incontinence he has got good strength in his legs able to flex and extend his ankles.  Postvoid bladder scan was ordered to ensure no signs of retention but seems less likely cord compression.    CThe patient is on the cardiac monitor to evaluate for evidence of arrhythmia and/or significant heart rate changes.      FINAL CLINICAL IMPRESSION(S) / ED DIAGNOSES   Final diagnoses:  FTT (failure to thrive) in adult  Primary malignant neoplasm of lung metastatic to other site, unspecified laterality (HCC)     Rx / DC Orders   ED Discharge Orders     None        Note:  This document was prepared using Dragon voice recognition software and may include unintentional dictation errors.   Ernest Ronal BRAVO, MD 07/10/24 [phone]

## 2024-07-10 NOTE — ED Triage Notes (Signed)
 Pt arrives via [facility] from home with c/o weakness that has gotten more pronounced over the last few weeks. Pt reports not being able to get up this morning. Pt is a cancer pt being treated for brain cancer at the cancer center with radiation, last tx yesterday. Pt reports eating, drinking, normal bathroom patterns with no pain or burning when they urinate. Per EMS pt had a fall on Friday while going up stairs; pt is on thinners but did not get get checked out after fall. Pt reports no pain. Pt is A&Ox3.

## 2024-07-10 NOTE — Plan of Care (Addendum)
 Consult noted for goals of care.  Patient is currently ED OTF and unavailable for conversation.  PMT will reattempt tomorrow.

## 2024-07-10 NOTE — H&P (Signed)
 History and Physical  Ethan Fitzgerald FMW:[id number] DOB: [birth year] DOA: 07/10/2024 PCP: Ethan Lynwood HERO, NP  Chief Complaint: Dizziness Historian: patient, wife  HPI:  Ethan Fitzgerald is a 68 y.o. 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 At baseline, they live at home with their wife and are mostly independent with their ADLs.  Typically walks with a cane or walker.  They presented from home to the ED on 07/10/2024 with inability to get up from bed x 2 days.  Patient was scheduled to begin third out of 3 weeks of radiation of his brain metastases today with [name] and was scheduled to meet with Ethan Fitzgerald and [name] tomorrow to discuss home hospice.  He was unable to get out of bed due to significant dizziness with even minimal movement.  The last time he was able to get out of bed was Saturday or Sunday.  Today, he was unable to even sit up at the bedside to urinate without significant dizziness.  He had a fall proximately 3 days ago that resulted in ecchymosis surrounding left eye and no other severe injuries.  Has known orthostatic hypotension and takes midodrine . Has history of prior stroke with left-sided residual deficits which he and wife state are unchanged from his baseline. Prior to this, he completed 2 weeks of radiation and was able to walk with a cane or a walker with progressively worsening tolerance and dizziness.  He has been experiencing significant scalp pain from the radiation which he has been treating with oxycodone .  He denies any nausea or vomiting. He states that he has had a very good appetite and even gained weight since he was started on steroids.  His wife is unable to help him sit up in bed so he is unable to use bedside commode or go to his follow-up appointments without her assistance at this time.  In the ED, it was found that  they had stable vital signs.  Significant findings included: Unremarkable CBC, unremarkable CMP, negative troponin x 2, negative for COVID, influenza, RSV.  Urinalysis normal.  CT of face, C-spine, head unremarkable for acute injuries.  Head CT is positive for slightly increased size of the known intracranial metastatic disease as well as mild edema in the left parietal lobe and right cerebellar hemisphere without significant mass effect.  Evidence of his chronic right MCA infarct was also seen.  They were initially treated with NS bolus.   Patient was admitted to medicine service for further workup and management of failure to thrive as outlined in detail below.  Assessment/Plan Principal Problem:   Failure to thrive in adult   Stage IV adenocarcinoma of the right lung with brain metastasis-previously completed XRT and CarboTaxol chemotherapy with maintenance durvalumab  completed on May 2025.  Underwent surgical resection of isolated brain met in July 2025.  Now showing progressive left meningeal masses.  CT scan on presentation worrisome for slight increase in metastatic disease with edema in left parietal lobe and right cerebellar hemisphere despite being on steroids and undergoing whole brain radiation.  Completed 2 out of the 3 weeks and was scheduled to start his third week of radiation today but unable due to significant dizziness likely related to that metastatic disease and edema. - Increasing steroid dose to address the increased edema.  There is no mass effect appreciated on imaging.  Due to described interference with sleep,  will consider changing dose to morning dose only. - Consulted palliative, [name] who was scheduled to meet with patient outpatient tomorrow to discuss possible home and hospice services.  Dr. [name] office has been made aware of his admission.  Family is interested in meeting with hospice and discussing discharging home with their service - Will discuss with  Dr. [name] office in regards to ongoing radiation given side effects and does not appear to be responding.  Patient and wife both indicated that they were leaning towards not continuing. - Continue home analgesia including Tylenol  and oxycodone  as needed - If tolerated and symptoms improve can consider physical therapy consult patient is too dizzy to participate at this time. - Can consider meclizine  COPD-oxygen saturations stable on room air without wheezing or respiratory complaints-  - Continue home inhaler prior formulary as well as as needed albuterol   Orthostatic hypotension-blood pressures have been stable at rest - Continue home midodrine   Past Medical History:  Diagnosis Date   COPD (chronic obstructive pulmonary disease) (HCC)    mild   COVID-19 09/2019   Erectile dysfunction    Headache    daily, stress headaches   Knee pain, left    Seasonal allergies    Stroke [facility])    Tobacco dependence    Past Surgical History:  Procedure Laterality Date   BACK SURGERY     metal plate in back   COLONOSCOPY     COLONOSCOPY WITH PROPOFOL  N/A 04/26/2019   Procedure: COLONOSCOPY WITH PROPOFOL ;  Surgeon: Therisa Bi, MD;  Location: [facility] ENDOSCOPY;  Service: Gastroenterology;  Laterality: N/A;   FINE NEEDLE ASPIRATION  03/03/2023   Procedure: FINE NEEDLE ASPIRATION;  Surgeon: Isadora Hose, MD;  Location: [facility] ENDOSCOPY;  Service: Pulmonary;;   HERNIA REPAIR Right    IR ANGIO INTRA EXTRACRAN SEL COM CAROTID INNOMINATE UNI L MOD SED  03/27/2020   IR ANGIO VERTEBRAL SEL SUBCLAVIAN INNOMINATE UNI L MOD SED  03/27/2020   IR ANGIO VERTEBRAL SEL VERTEBRAL UNI R MOD SED  03/27/2020   IR CT HEAD LTD  03/27/2020   IR IMAGING GUIDED PORT INSERTION  03/29/2023   IR PERCUTANEOUS ART THROMBECTOMY/INFUSION INTRACRANIAL INC DIAG ANGIO  03/27/2020   KNEE ARTHROSCOPY Left 03/28/2015   Procedure: Arthroscopic partial medial meniscectomy plus chondral debridement;  Surgeon: Helayne Glenn, MD;   Location: [facility];  Service: Orthopedics;  Laterality: Left;   RADIOLOGY WITH ANESTHESIA N/A 03/27/2020   Procedure: IR WITH ANESTHESIA;  Surgeon: Dolphus Carrion, MD;  Location: [facility] OR;  Service: Radiology;  Laterality: N/A;   VIDEO BRONCHOSCOPY WITH ENDOBRONCHIAL ULTRASOUND  03/03/2023   Procedure: VIDEO BRONCHOSCOPY WITH ENDOBRONCHIAL ULTRASOUND;  Surgeon: Isadora Hose, MD;  Location: [facility] ENDOSCOPY;  Service: Pulmonary;;    reports that he quit smoking about 6 years ago. His smoking use included cigarettes. He started smoking about 55 years ago. He has a 73.5 pack-year smoking history. He has quit using smokeless tobacco. He reports that he does not currently use alcohol. He reports that he does not use drugs.  No Known Allergies  Family History  Problem Relation Age of Onset   Diabetes Mother    Heart attack Mother 39   Cancer Father        liver    COPD Brother    HIV/AIDS Brother    Prostate cancer Neg Hx    Colon cancer Neg Hx    Prior to Admission medications   Medication Sig Start Date End Date Taking? Authorizing Provider  albuterol  (VENTOLIN  HFA) 108 (90 Base) MCG/ACT inhaler Inhale 2 puffs into the lungs every 4 (four) hours as needed for wheezing or shortness of breath. 04/15/23  Yes Karamalegos, Marsa PARAS, DO  aspirin  EC 81 MG tablet Take 1 tablet (81 mg total) by mouth daily. Swallow whole. 03/20/24  Yes Hammock, Tylene, NP  Cholecalciferol (VITAMIN D ) 50 MCG (2000 UT) tablet Take 2,000 Units by mouth daily.   Yes [provider]  dexamethasone  (DECADRON ) 2 MG tablet Take 1 tablet (2 mg total) by mouth 2 (two) times daily with a meal for 100 doses. 06/20/24 08/09/24 Yes Chrystal, Marcey, MD  ezetimibe  (ZETIA ) 10 MG tablet Take 1 tablet (10 mg total) by mouth daily. 02/17/24  Yes Ethan Lynwood HERO, NP  midodrine  (PROAMATINE ) 5 MG tablet TAKE 1 TABLET BY MOUTH 3 TIMES DAILY WITH MEALS. 01/25/24  Yes Ethan Lynwood HERO, NP  Multiple Vitamins-Minerals (IMMUNE  SUPPORT VITAMIN C PO) Take 1 tablet by mouth daily.   Yes [provider]  oxyCODONE  (OXY IR/ROXICODONE ) 5 MG immediate release tablet Take 1 tablet (5 mg total) by mouth every 6 (six) hours as needed for severe pain (pain score 7-10). 07/09/24  Yes Borders, Fonda SAUNDERS, NP  sildenafil  (REVATIO ) 20 MG tablet TAKE 1 TO 5 TABLETS BY MOUTH ABOUT 30 MINUTES PRIOR TO SEX.START WITH 1 THEN INCREASE 03/10/22  Yes Karamalegos, Marsa PARAS, DO  STIOLTO RESPIMAT  2.5-2.5 MCG/ACT AERS INHALE 2 PUFFS EVERY DAY 09/12/23  Yes Karamalegos, Marsa PARAS, DO  acetaminophen  (TYLENOL ) 325 MG tablet Take 650 mg by mouth. Patient not taking: Reported on 07/10/2024 01/19/24   [provider]  acetaminophen  (TYLENOL ) 500 MG tablet Take 2 tablets (1,000 mg total) by mouth every 6 (six) hours as needed. Patient not taking: Reported on 07/10/2024 06/15/24 06/15/25  Clarine Ozell LABOR, MD  b complex vitamins capsule Take 1 capsule by mouth daily.    [provider]  baclofen  (LIORESAL ) 10 MG tablet Take 10 mg by mouth as needed for muscle spasms (has if needed for spasms back of head post surgery). Patient not taking: Reported on 07/10/2024    [provider]  fluticasone  (FLONASE ) 50 MCG/ACT nasal spray Place 2 sprays into both nostrils daily. Patient not taking: Reported on 07/10/2024 12/29/22   Edman Marsa PARAS, DO   I have personally, briefly reviewed patient's prior medical records in [facility]  Objective: Blood pressure 118/82, pulse 78, temperature (!) 97.5 F (36.4 C), temperature source Oral, resp. rate 17, height 5' 11 (1.803 m), weight 68.6 kg, SpO2 100%.   Constitutional: NAD, calm, comfortable. Tired appearing HEENT: lids of left eye with evolving ecchymosis and minimal swelling without apparent interference of EOEM. MMM. Posterior pharynx clear of any exudate or lesions. Normal dentition. Significant hair loss. Neck: normal, supple, no masses, no thyromegaly Respiratory:  CTAB, no wheezing, no crackles. Normal respiratory effort. No accessory muscle use.  Cardiovascular: RRR, no murmurs / rubs / gallops. No extremity edema. 2+ pedal pulses. no clubbing / cyanosis.  Abdomen: soft, NT, ND, no masses or HSM palpated. Musculoskeletal: No joint deformity upper and lower extremities. Normal muscle tone.  Skin: dry, intact, normal color, normal temperature on exposed skin Neurologic: Alert and oriented x 3. Normal speech. Positive for asymmetric weakness of his left arm compared to right as well as left facial droop which he and wife indicate is his baseline since his prior stroke- no changes. PERRL Psychiatric: flat mood. Congruent affect.  Labs on Admission: I have  personally reviewed admission labs and imaging studies  CBC    Component Value Date/Time   WBC 9.0 07/10/2024 1146   RBC 4.55 07/10/2024 1146   HGB 14.9 07/10/2024 1146   HGB 14.2 03/12/2024 1016   HCT 45.1 07/10/2024 1146   PLT 188 07/10/2024 1146   PLT 194 03/12/2024 1016   MCV 99.1 07/10/2024 1146   MCH 32.7 07/10/2024 1146   MCHC 33.0 07/10/2024 1146   RDW 14.5 07/10/2024 1146   LYMPHSABS 1.1 07/10/2024 1146   MONOABS 0.4 07/10/2024 1146   EOSABS 0.3 07/10/2024 1146   BASOSABS 0.1 07/10/2024 1146   CMP     Component Value Date/Time   NA 140 07/10/2024 1146   K 3.9 07/10/2024 1146   CL 102 07/10/2024 1146   CO2 26 07/10/2024 1146   GLUCOSE 89 07/10/2024 1146   BUN 28 (H) 07/10/2024 1146   CREATININE 0.99 07/10/2024 1146   CREATININE 1.23 01/11/2024 0748   CREATININE 0.92 12/22/2022 0808   CALCIUM  9.8 07/10/2024 1146   PROT 7.7 07/10/2024 1146   ALBUMIN 3.5 07/10/2024 1146   AST 19 07/10/2024 1146   AST 31 01/11/2024 0748   ALT 28 07/10/2024 1146   ALT 23 01/11/2024 0748   ALKPHOS 101 07/10/2024 1146   BILITOT 1.0 07/10/2024 1146   BILITOT 0.7 01/11/2024 0748   GFRNONAA >60 07/10/2024 1146   GFRNONAA >60 01/11/2024 0748   GFRNONAA 74 12/22/2020 0801   GFRAA 85 12/22/2020  0801    Radiological Exams on Admission: CT Cervical Spine Wo Contrast Result Date: 07/10/2024 EXAM: CT CERVICAL SPINE WITHOUT CONTRAST 07/10/2024 01:09:08 PM TECHNIQUE: CT of the cervical spine was performed without the administration of intravenous contrast. Multiplanar reformatted images are provided for review. Automated exposure control, iterative reconstruction, and/or weight based adjustment of the mA/kV was utilized to reduce the radiation dose to as low as reasonably achievable. COMPARISON: CT cervical spine 06/15/2024. CLINICAL HISTORY: Neck trauma (Age >= 65y). FINDINGS: CERVICAL SPINE: BONES AND ALIGNMENT: No acute fracture or traumatic malalignment. Straightening/mild reversal of the normal cervical lordosis. Trace degenerative anterolisthesis of C4 on C5. DEGENERATIVE CHANGES: Advanced multilevel facet arthrosis, asymmetrically severe on the right at C2-C3, C3-C4, and C4-C5. Right-sided facet and partial interbody ankylosis at C4-C5. Advanced disc degeneration at C5-C6 and C6-C7. Advanced right neural foraminal stenosis at C3-C4, C4-C5, and C6-C7 due to uncovertebral and facet spurring. Potential moderate spinal stenosis at C6-C7. SOFT TISSUES: No prevertebral soft tissue swelling. IMPRESSION: 1. No acute cervical spine fracture or traumatic malalignment. 2. Advanced multilevel disc and facet degeneration. Electronically signed by: Dasie Hamburg MD 07/10/2024 01:54 PM EST RP Workstation: [id number]   CT Maxillofacial Wo Contrast Result Date: 07/10/2024 EXAM: CT OF THE FACE WITHOUT CONTRAST 07/10/2024 01:09:08 PM TECHNIQUE: CT of the face was performed without the administration of intravenous contrast. Multiplanar reformatted images are provided for review. Automated exposure control, iterative reconstruction, and/or weight based adjustment of the mA/kV was utilized to reduce the radiation dose to as low as reasonably achievable. COMPARISON: CTA head and neck 08/18/2021. CLINICAL HISTORY: Facial  trauma, blunt. FINDINGS: FACIAL BONES: Edentulous. No acute facial fracture. No mandibular dislocation. No suspicious bone lesion. ORBITS: Globes are intact. No acute traumatic injury. No inflammatory change. SINUSES AND MASTOIDS: Minimal mucosal thickening in the paranasal sinuses. Clear mastoid air cells. SOFT TISSUES: No acute abnormality. IMPRESSION: 1. No acute facial fracture. Electronically signed by: Dasie Hamburg MD 07/10/2024 01:49 PM EST RP Workstation: [id number]   CT HEAD WO CONTRAST ( )  Result Date: 07/10/2024 EXAM: CT HEAD WITHOUT CONTRAST 07/10/2024 01:09:08 PM TECHNIQUE: CT of the head was performed without the administration of intravenous contrast. Automated exposure control, iterative reconstruction, and/or weight based adjustment of the mA/kV was utilized to reduce the radiation dose to as low as reasonably achievable. COMPARISON: Head CT and MRI 06/15/2024. CLINICAL HISTORY: Head trauma, minor (Age >= 65y). FINDINGS: BRAIN AND VENTRICLES: No acute infarct, intracranial hemorrhage, midline shift, hydrocephalus, or extra-axial fluid collection is identified. A moderate sized chronic right MCA infarct is again noted with associated ex vacuo dilatation of the right lateral ventricle. Sequelae of suboccipital craniectomy are again identified with encephalomalacia in the inferior left cerebellar hemisphere. Multiple extra-axial/dural based masses in the posterior fossa were better demonstrated by the prior MRI. The most conspicuous lesions on this noncontrast CT are noted at the level of the tentorial incisura and torcula. Associated mild edema in the inferomedial left parietal lobe and right cerebellar hemisphere has mildly increased. There is no cerebral atrophy. Calcified atherosclerosis at the skull base. ORBITS AND SINUSES : The orbits and sinuses are reported separately on the dedicated maxillofacial CT. SOFT TISSUES AND SKULL: No acute soft tissue abnormality. No skull fracture. IMPRESSION:  1. No evidence of acute intracranial injury. 2. Known intracranial metastatic disease with slightly increased, mild edema in the left parietal lobe and right cerebellar hemisphere. No significant mass effect. 3. Chronic right MCA infarct. Electronically signed by: Dasie Hamburg MD 07/10/2024 01:47 PM EST RP Workstation: [id number]   DG Chest Portable 1 View Result Date: 07/10/2024 CLINICAL DATA:  Cough. EXAM: PORTABLE CHEST 1 VIEW COMPARISON:  06/26/2023 and PET-CT 06/26/2024 FINDINGS: Left jugular Port-A-Cath with the tip near the superior cavoatrial junction. Lucency in the upper lungs compatible with known emphysema. Both lungs are clear. Right paratracheal density is compatible with known right lung mass. This mass has significantly decreased in size compared to 2024. IMPRESSION: 1. No acute cardiopulmonary disease. 2. Emphysema. Electronically Signed   By: Juliene Balder M.D.   On: 07/10/2024 13:12   EKG: Independently reviewed. NSR HR 79  DVT prophylaxis: SCDs Start: 07/10/24 1617   Code Status: DNR- patient has DNR form with him in room and wife and patient both endorse that his continued wishes are to be DNR.  Family Communication: wife at bedside   Disposition Plan: admit to obs awaiting hospice eval and ongoing discussion of his radiation treatment plan with oncology   Consults called: none. Consulted palliative via epic interface. Will discuss with oncology in the morning- They were notified of admission from family due to cancelling their appointments and from EDP.    Marien LITTIE Piety, DO [facility]  07/10/2024, 4:19 PM    To contact the appropriate [facility] Attending or Consulting provider: Check [email or web address] for coverage from 7pm-7am

## 2024-07-11 ENCOUNTER — Ambulatory Visit

## 2024-07-11 ENCOUNTER — Inpatient Hospital Stay: Admitting: Hospice and Palliative Medicine

## 2024-07-11 DIAGNOSIS — C3490 Malignant neoplasm of unspecified part of unspecified bronchus or lung: Secondary | ICD-10-CM | POA: Diagnosis not present

## 2024-07-11 DIAGNOSIS — R627 Adult failure to thrive: Secondary | ICD-10-CM | POA: Diagnosis not present

## 2024-07-11 LAB — RESPIRATORY PANEL BY PCR

## 2024-07-11 NOTE — Plan of Care (Signed)
  Problem: Clinical Measurements: Goal: Ability to maintain clinical measurements within normal limits will improve Outcome: Progressing Goal: Will remain free from infection Outcome: Progressing   Problem: Activity: Goal: Risk for activity intolerance will decrease Outcome: Progressing   Problem: Nutrition: Goal: Adequate nutrition will be maintained Outcome: Progressing   Problem: Coping: Goal: Level of anxiety will decrease Outcome: Progressing   Problem: Elimination: Goal: Will not experience complications related to bowel motility Outcome: Progressing Goal: Will not experience complications related to urinary retention Outcome: Progressing

## 2024-07-11 NOTE — Care Management Obs Status (Signed)
 MEDICARE OBSERVATION STATUS NOTIFICATION   Patient Details  Name: Ethan Fitzgerald MRN: [id number] Date of Birth: Apr 11, 1956   Medicare Observation Status Notification Given:  Chaney BRANDY CHRISTIANE LELON, CMA 07/11/2024, 1:18 PM

## 2024-07-11 NOTE — Plan of Care (Signed)
   Problem: Education: Goal: Knowledge of General Education information will improve Description Including pain rating scale, medication(s)/side effects and non-pharmacologic comfort measures Outcome: Progressing

## 2024-07-11 NOTE — TOC Initial Note (Signed)
 Transition of Care [facility]) - Initial/Assessment Note    Patient Details  Name: Ethan Fitzgerald MRN: [id number] Date of Birth: February 05, 1956  Transition of Care [facility]) CM/SW Contact:    Victory Jackquline GORMAN, RN Phone Number: 07/11/2024, 2:50 PM  Clinical Narrative:  Chart reviewed. RNCM,received a secure chat from the NP saying that the wife was interested in [facility] when the patient is discharged. Rep with [facility] added to the secure chat and asked to evaluate the patient. RNCM spoke with the patient and wife at the bedside. I introduced myself, my role, and explained that discharge planning would be discussed. I explained that I received a message from the NP and they both said yes to Home with Home Hospice. Patient has DME at home:(Adjustable bed, BSC, walker with wheels and seat, shower bench). Patient accepted by [facility] and will follow up with patient at discharge. RNCM will continue to follow for discharge planning/care coordination and update as applicable.                Expected Discharge Plan: Home w Hospice Care Barriers to Discharge: Continued Medical Work up   Patient Goals and CMS Choice            Expected Discharge Plan and Services       Living arrangements for the past 2 months: Single Family Home                                      Prior Living Arrangements/Services Living arrangements for the past 2 months: Single Family Home Lives with:: Self, Spouse Patient language and need for interpreter reviewed:: Yes Do you feel safe going back to the place where you live?: Yes      Need for Family Participation in Patient Care: Yes (Comment) Care giver support system in place?: Yes (comment) Current home services: DME Criminal Activity/Legal Involvement Pertinent to Current Situation/Hospitalization: No - Comment as needed  Activities of Daily Living   ADL Screening (condition at time of admission) Independently performs ADLs?: No Does  the patient have a NEW difficulty with bathing/dressing/toileting/self-feeding that is expected to last >3 days?: Yes (Initiates electronic notice to provider for possible OT consult) Does the patient have a NEW difficulty with getting in/out of bed, walking, or climbing stairs that is expected to last >3 days?: Yes (Initiates electronic notice to provider for possible PT consult) Does the patient have a NEW difficulty with communication that is expected to last >3 days?: No Is the patient deaf or have difficulty hearing?: No Does the patient have difficulty seeing, even when wearing glasses/contacts?: No Does the patient have difficulty concentrating, remembering, or making decisions?: Yes  Permission Sought/Granted                  Emotional Assessment Appearance:: Appears stated age, Well-Groomed Attitude/Demeanor/Rapport: Engaged, Gracious, Self-Confident Affect (typically observed): Accepting, Calm, Quiet, Pleasant Orientation: : Oriented to Self, Oriented to Place, Oriented to  Time, Oriented to Situation Alcohol / Substance Use: Not Applicable Psych Involvement: No (comment)  Admission diagnosis:  Failure to thrive in adult [R62.7] FTT (failure to thrive) in adult [R62.7] Primary malignant neoplasm of lung metastatic to other site, unspecified laterality [facility]) [C34.90] Patient Active Problem List   Diagnosis Date Noted   Failure to thrive in adult 07/10/2024   Hospital discharge follow-up 02/17/2024   Preventative health care 12/29/2023   COPD (chronic obstructive  pulmonary disease) (HCC) 06/26/2023   Iron  deficiency anemia 04/12/2023   Adenocarcinoma of upper lobe of right lung (HCC) 03/15/2023   Snoring 01/14/2022   Degenerative cervical spinal stenosis 11/03/2021   Prediabetes 10/29/2021   Tobacco dependence 10/29/2021   Hypertriglyceridemia 10/29/2021   PAD (peripheral artery disease) 10/29/2020   History of stroke 04/16/2020   ICAO (internal carotid artery  occlusion), left 04/16/2020   Hemiplegia of dominant side following cerebrovascular accident (CVA) (HCC) 04/16/2020   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) 04/16/2020   Parotid mass 03/31/2020   Carotid stenosis 03/31/2020   Hypotension 03/31/2020   Dysphagia due to recent stroke 03/31/2020   COPD with acute exacerbation (HCC) 03/31/2020   Hypokalemia 03/31/2020   Right shoulder pain 03/31/2020   SAH (subarachnoid hemorrhage) (HCC) secondary to IR 03/31/2020   Middle cerebral artery embolism, right 03/27/2020   History of COVID-19 12/04/2019   Centrilobular emphysema (HCC) 04/03/2019   Seasonal allergies 02/18/2017   Elevated hemoglobin A1c 02/18/2017   Erectile dysfunction 02/18/2017   Hyperlipidemia 02/18/2017   History of hemorrhoids 02/18/2017   Former smoker 02/18/2017   Chronic low back pain 02/18/2017   Chronic pain of left knee 02/18/2017   Tear of meniscus of knee 03/06/2015   PCP:  [name], NP Pharmacy:   [facility] - [place], [place] - [address] [place] [address] Phone: [phone] Fax: [phone]  [id number] - [address] [place] - [address] [place] [address] Phone: [phone] Fax: [phone]     Social Drivers of Health (SDOH) Social History: SDOH Screenings   Food Insecurity: No Food Insecurity (07/10/2024)  Housing: Low Risk  (07/10/2024)  Transportation Needs: No Transportation Needs (07/10/2024)  Utilities: Not At Risk (07/10/2024)  Alcohol Screen: Low Risk  (03/30/2024)  Depression (PHQ2-9): Low Risk  (07/06/2024)  Financial Resource Strain: Low Risk  (02/07/2024)  Physical Activity: Insufficiently Active (03/30/2024)  Social Connections: Socially Isolated (07/10/2024)  Stress: No Stress Concern Present (03/30/2024)  Tobacco Use: Medium Risk (07/10/2024)  Health Literacy: Adequate Health Literacy (03/30/2024)   SDOH Interventions:      Readmission Risk Interventions     No data to display

## 2024-07-11 NOTE — Progress Notes (Signed)
 PROGRESS NOTE Ethan Fitzgerald    DOB: 03/02/1956, 68 y.o.  FMW:[id number]    Code Status: Do not attempt resuscitation (DNR) PRE-ARREST INTERVENTIONS DESIRED   DOA: 07/10/2024   LOS: 0  Brief hospital course  Ethan Fitzgerald is a 68 y.o. 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 At baseline, they live at home with their wife and are mostly independent with their ADLs.  Typically walks with a cane or walker.   They presented from home to the ED on 07/10/2024 with inability to get up from bed x 2 days.  Patient was scheduled to begin third out of 3 weeks of radiation of his brain metastases today with Ethan Fitzgerald and was scheduled to meet with Ethan Fitzgerald and Ethan Fitzgerald tomorrow to discuss home hospice.  He was unable to get out of bed due to significant dizziness with even minimal movement.  The last time he was able to get out of bed was Saturday or Sunday.  Today, he was unable to even sit up at the bedside to urinate without significant dizziness.  He had a fall proximately 3 days ago that resulted in ecchymosis surrounding left eye and no other severe injuries.  Has known orthostatic hypotension and takes midodrine . Has history of prior stroke with left-sided residual deficits which he and wife state are unchanged from his baseline. Prior to this, he completed 2 weeks of radiation and was able to walk with a cane or a walker with progressively worsening tolerance and dizziness.  He has been experiencing significant scalp pain from the radiation which he has been treating with oxycodone .  He denies any nausea or vomiting. He states that he has had a very good appetite and even gained weight since he was started on steroids.  His wife is unable to help him sit up in bed so he is unable to use bedside commode or go to his follow-up appointments without her assistance at this  time.   In the ED, it was found that they had stable vital signs.  Significant findings included: Unremarkable CBC, unremarkable CMP, negative troponin x 2, negative for COVID, influenza, RSV.  Urinalysis normal.  CT of face, C-spine, head unremarkable for acute injuries.  Head CT is positive for slightly increased size of the known intracranial metastatic disease as well as mild edema in the left parietal lobe and right cerebellar hemisphere without significant mass effect.  Evidence of his chronic right MCA infarct was also seen.   They were initially treated with NS bolus.    Patient was admitted to medicine service for further workup and management of failure to thrive as outlined in detail below.  07/11/24 -improved clinical status. Electing to pursue hospice care at this time  Assessment & Plan  Principal Problem:   Failure to thrive in adult  Stage IV adenocarcinoma of the right lung with brain metastasis-previously completed XRT and CarboTaxol chemotherapy with maintenance durvalumab  completed on May 2025.  Underwent surgical resection of isolated brain met in July 2025.  Now showing progressive left meningeal masses.  CT scan on presentation worrisome for slight increase in metastatic disease with edema in left parietal lobe and right cerebellar hemisphere despite being on steroids and undergoing whole brain radiation.  Completed 2 out of the 3 weeks and was scheduled to start his third week of radiation today but unable due to significant dizziness likely  related to that metastatic disease and edema. - Increasing steroid dose to address the increased edema.  There is no mass effect appreciated on imaging.  Due to described interference with sleep, will consider changing dose to morning dose only. - Consulted palliative, Ethan Fitzgerald who was scheduled to meet with patient outpatient tomorrow to discuss possible home and hospice services.  Ethan Fitzgerald office has been made aware of his  admission.  Family is interested in meeting with hospice and discussing discharging home with their service - Will discuss with Ethan Fitzgerald office in regards to ongoing radiation given side effects and does not appear to be responding.  Patient and wife both indicated that they were leaning towards not continuing. - Continue home analgesia including Tylenol  and oxycodone  as needed - If tolerated and symptoms improve can consider physical therapy consult patient is too dizzy to participate at this time. - Can consider meclizine   COPD-oxygen saturations stable on room air without wheezing or respiratory complaints-  - Continue home inhaler prior formulary as well as as needed albuterol    Orthostatic hypotension-blood pressures have been stable at rest - Continue home midodrine   Body mass index is 21.09 kg/m.  VTE ppx: SCDs Start: 07/10/24 1617  Diet:     Diet   Diet regular Room service appropriate? Yes; Fluid consistency: Thin   Consultants: Palliative Oncology   Subjective 07/11/24    Pt reports feeling alright today. Dizziness continues but to slightly lesser degree. Head pain is moderately well controlled currently. Plans to continue to pursue radiation.    Objective  Blood pressure 107/73, pulse 84, temperature 98.2 F (36.8 C), temperature source Oral, resp. rate 18, height 5' 11 (1.803 m), weight 68.6 kg, SpO2 98%.  Intake/Output Summary (Last 24 hours) at 07/11/2024 0730 Last data filed at 07/10/2024 2130 Gross per 24 hour  Intake --  Output 575 ml  Net -575 ml   Filed Weights   07/10/24 1145  Weight: 68.6 kg     Physical Exam:  General: awake, alert, NAD Respiratory: normal respiratory effort. Cardiovascular: extremities well perfused Nervous: A&O x3. no gross focal neurologic deficits, normal speech Extremities: moves all equally, no edema, normal tone Skin: dry, intact, normal temperature, normal color. No rashes, lesions or ulcers on exposed  skin Psychiatry: normal mood, congruent affect  Labs   I have personally reviewed the following labs and imaging studies CBC    Component Value Date/Time   WBC 9.0 07/10/2024 1146   RBC 4.55 07/10/2024 1146   HGB 14.9 07/10/2024 1146   HGB 14.2 03/12/2024 1016   HCT 45.1 07/10/2024 1146   PLT 188 07/10/2024 1146   PLT 194 03/12/2024 1016   MCV 99.1 07/10/2024 1146   MCH 32.7 07/10/2024 1146   MCHC 33.0 07/10/2024 1146   RDW 14.5 07/10/2024 1146   LYMPHSABS 1.1 07/10/2024 1146   MONOABS 0.4 07/10/2024 1146   EOSABS 0.3 07/10/2024 1146   BASOSABS 0.1 07/10/2024 1146      Latest Ref Rng & Units 07/10/2024   11:46 AM 06/15/2024    5:10 PM 01/18/2024    5:19 PM  BMP  Glucose 70 - 99 mg/dL 89  96  880   BUN 8 - 23 mg/dL 28  25  20    Creatinine 0.61 - 1.24 mg/dL 9.00  8.82  9.10   Sodium 135 - 145 mmol/L 140  134  138   Potassium 3.5 - 5.1 mmol/L 3.9  3.5  4.1   Chloride 98 -  111 mmol/L 102  102  108   CO2 22 - 32 mmol/L 26  20  24    Calcium  8.9 - 10.3 mg/dL 9.8  89.9  9.8     CT Cervical Spine Wo Contrast Result Date: 07/10/2024 EXAM: CT CERVICAL SPINE WITHOUT CONTRAST 07/10/2024 01:09:08 PM TECHNIQUE: CT of the cervical spine was performed without the administration of intravenous contrast. Multiplanar reformatted images are provided for review. Automated exposure control, iterative reconstruction, and/or weight based adjustment of the mA/kV was utilized to reduce the radiation dose to as low as reasonably achievable. COMPARISON: CT cervical spine 06/15/2024. CLINICAL HISTORY: Neck trauma (Age >= 65y). FINDINGS: CERVICAL SPINE: BONES AND ALIGNMENT: No acute fracture or traumatic malalignment. Straightening/mild reversal of the normal cervical lordosis. Trace degenerative anterolisthesis of C4 on C5. DEGENERATIVE CHANGES: Advanced multilevel facet arthrosis, asymmetrically severe on the right at C2-C3, C3-C4, and C4-C5. Right-sided facet and partial interbody ankylosis at C4-C5.  Advanced disc degeneration at C5-C6 and C6-C7. Advanced right neural foraminal stenosis at C3-C4, C4-C5, and C6-C7 due to uncovertebral and facet spurring. Potential moderate spinal stenosis at C6-C7. SOFT TISSUES: No prevertebral soft tissue swelling. IMPRESSION: 1. No acute cervical spine fracture or traumatic malalignment. 2. Advanced multilevel disc and facet degeneration. Electronically signed by: Dasie Hamburg MD 07/10/2024 01:54 PM EST RP Workstation: [id number]   CT Maxillofacial Wo Contrast Result Date: 07/10/2024 EXAM: CT OF THE FACE WITHOUT CONTRAST 07/10/2024 01:09:08 PM TECHNIQUE: CT of the face was performed without the administration of intravenous contrast. Multiplanar reformatted images are provided for review. Automated exposure control, iterative reconstruction, and/or weight based adjustment of the mA/kV was utilized to reduce the radiation dose to as low as reasonably achievable. COMPARISON: CTA head and neck 08/18/2021. CLINICAL HISTORY: Facial trauma, blunt. FINDINGS: FACIAL BONES: Edentulous. No acute facial fracture. No mandibular dislocation. No suspicious bone lesion. ORBITS: Globes are intact. No acute traumatic injury. No inflammatory change. SINUSES AND MASTOIDS: Minimal mucosal thickening in the paranasal sinuses. Clear mastoid air cells. SOFT TISSUES: No acute abnormality. IMPRESSION: 1. No acute facial fracture. Electronically signed by: Dasie Hamburg MD 07/10/2024 01:49 PM EST RP Workstation: [id number]   CT HEAD WO CONTRAST ( ) Result Date: 07/10/2024 EXAM: CT HEAD WITHOUT CONTRAST 07/10/2024 01:09:08 PM TECHNIQUE: CT of the head was performed without the administration of intravenous contrast. Automated exposure control, iterative reconstruction, and/or weight based adjustment of the mA/kV was utilized to reduce the radiation dose to as low as reasonably achievable. COMPARISON: Head CT and MRI 06/15/2024. CLINICAL HISTORY: Head trauma, minor (Age >= 65y). FINDINGS: BRAIN AND  VENTRICLES: No acute infarct, intracranial hemorrhage, midline shift, hydrocephalus, or extra-axial fluid collection is identified. A moderate sized chronic right MCA infarct is again noted with associated ex vacuo dilatation of the right lateral ventricle. Sequelae of suboccipital craniectomy are again identified with encephalomalacia in the inferior left cerebellar hemisphere. Multiple extra-axial/dural based masses in the posterior fossa were better demonstrated by the prior MRI. The most conspicuous lesions on this noncontrast CT are noted at the level of the tentorial incisura and torcula. Associated mild edema in the inferomedial left parietal lobe and right cerebellar hemisphere has mildly increased. There is no cerebral atrophy. Calcified atherosclerosis at the skull base. ORBITS AND SINUSES : The orbits and sinuses are reported separately on the dedicated maxillofacial CT. SOFT TISSUES AND SKULL: No acute soft tissue abnormality. No skull fracture. IMPRESSION: 1. No evidence of acute intracranial injury. 2. Known intracranial metastatic disease with slightly increased, mild edema in the left  parietal lobe and right cerebellar hemisphere. No significant mass effect. 3. Chronic right MCA infarct. Electronically signed by: Dasie Hamburg MD 07/10/2024 01:47 PM EST RP Workstation: [id number]   DG Chest Portable 1 View Result Date: 07/10/2024 CLINICAL DATA:  Cough. EXAM: PORTABLE CHEST 1 VIEW COMPARISON:  06/26/2023 and PET-CT 06/26/2024 FINDINGS: Left jugular Port-A-Cath with the tip near the superior cavoatrial junction. Lucency in the upper lungs compatible with known emphysema. Both lungs are clear. Right paratracheal density is compatible with known right lung mass. This mass has significantly decreased in size compared to 2024. IMPRESSION: 1. No acute cardiopulmonary disease. 2. Emphysema. Electronically Signed   By: Juliene Balder M.D.   On: 07/10/2024 13:12    Disposition Plan & Communication  Patient  status: Observation  Admitted From: Home Planned disposition location: Hospice care Anticipated discharge date: 11/6 pending equipment delivery  Family Communication: wife on phone    Author: Marien LITTIE Piety, DO [facility] 07/11/2024, 7:30 AM   Available by Epic secure chat 7AM-7PM. If 7PM-7AM, please contact night-coverage.  [facility] contact information found on [email or web address].

## 2024-07-11 NOTE — Consult Note (Signed)
 [facility]  [phone] [phone]  ID: Ethan Fitzgerald OB: 1956-07-31  MR#: [id number]  RDW#:[id number]  Patient Care Team: [name], NP as PCP - General (Nurse Practitioner) Perla Evalene PARAS, MD as PCP - Cardiology (Cardiology) Rosemarie Eather GORMAN, MD as Referring Physician (Neurology) Verdene Gills, RN as Oncology Nurse Navigator Jacobo, Evalene PARAS, MD as Consulting Physician (Oncology) Gerard Frederick, NP as Nurse Practitioner (Cardiology) Devra Lands, RN as VBCI Care Management  CHIEF COMPLAINT: Recurrent stage IV adenocarcinoma of the right lung now with brain metastasis  INTERVAL HISTORY: Patient is a 68 year old male recently found to have recurrent metastatic disease of his lung cancer in his brain and is actively undergoing XRT.  His performance status has significantly declined when he has not been out of bed for multiple days and is having increased confusion.  Patient reports his weakness is improved, but he does exhibit periods of confusion.  He has no other neurologic complaints.  He denies any fevers.  He has not fair appetite, but denies weight loss.  He has no chest pain, shortness of breath, cough, or hemoptysis.  He denies any nausea, vomiting, constipation, or diarrhea.  He has no urinary complaints.  Patient offers no further specific complaints today.  REVIEW OF SYSTEMS:   Review of Systems  Constitutional:  Positive for malaise/fatigue. Negative for fever and weight loss.  Respiratory: Negative.  Negative for cough, hemoptysis and shortness of breath.   Cardiovascular: Negative.  Negative for chest pain and leg swelling.  Gastrointestinal: Negative.  Negative for diarrhea.  Genitourinary: Negative.  Negative for dysuria.  Musculoskeletal: Negative.  Negative for back pain.  Skin: Negative.  Negative for rash.  Neurological:  Positive for weakness. Negative for dizziness, focal weakness and headaches.   Psychiatric/Behavioral:  Positive for memory loss.     As per HPI. Otherwise, a complete review of systems is negative.  PAST MEDICAL HISTORY: Past Medical History:  Diagnosis Date   COPD (chronic obstructive pulmonary disease) (HCC)    mild   COVID-19 09/2019   Erectile dysfunction    Headache    daily, stress headaches   Knee pain, left    Seasonal allergies    Stroke (HCC)    Tobacco dependence     PAST SURGICAL HISTORY: Past Surgical History:  Procedure Laterality Date   BACK SURGERY     metal plate in back   COLONOSCOPY     COLONOSCOPY WITH PROPOFOL  N/A 04/26/2019   Procedure: COLONOSCOPY WITH PROPOFOL ;  Surgeon: Therisa Bi, MD;  Location: [facility] END[facility]COPY;  Service: Gastroenterology;  Laterality: N/A;   FINE NEEDLE ASPIRATION  03/03/2023   Procedure: FINE NEEDLE ASPIRATION;  Surgeon: Isadora Hose, MD;  Location: [facility] ENDOSCOPY;  Service: Pulmonary;;   HERNIA REPAIR Right    IR ANGIO INTRA EXTRACRAN SEL COM CAROTID INNOMINATE UNI L MOD SED  03/27/2020   IR ANGIO VERTEBRAL SEL SUBCLAVIAN INNOMINATE UNI L MOD SED  03/27/2020   IR ANGIO VERTEBRAL SEL VERTEBRAL UNI R MOD SED  03/27/2020   IR CT HEAD LTD  03/27/2020   IR IMAGING GUIDED PORT INSERTION  03/29/2023   IR PERCUTANEOUS ART THROMBECTOMY/INFUSION INTRACRANIAL INC DIAG ANGIO  03/27/2020   KNEE ARTHROSCOPY Left 03/28/2015   Procedure: Arthroscopic partial medial meniscectomy plus chondral debridement;  Surgeon: Helayne Glenn, MD;  Location: [facility];  Service: Orthopedics;  Laterality: Left;   RADIOLOGY WITH ANESTHESIA N/A 03/27/2020   Procedure: IR WITH ANESTHESIA;  Surgeon: Dolphus Carrion, MD;  Location: [facility] OR;  Service: Radiology;  Laterality: N/A;   VIDEO BRONCHOSCOPY WITH ENDOBRONCHIAL ULTRASOUND  03/03/2023   Procedure: VIDEO BRONCHOSCOPY WITH ENDOBRONCHIAL ULTRASOUND;  Surgeon: Isadora Hose, MD;  Location: [facility] ENDOSCOPY;  Service: Pulmonary;;    FAMILY HISTORY: Family History  Problem  Relation Age of Onset   Diabetes Mother    Heart attack Mother 68   Cancer Father        liver    COPD Brother    HIV/AIDS Brother    Prostate cancer Neg Hx    Colon cancer Neg Hx     ADVANCED DIRECTIVES (Y/N):  @ADVDIR @  HEALTH MAINTENANCE: Social History   Tobacco Use   Smoking status: Former    Current packs/day: 0.00    Average packs/day: 1.5 packs/day for 49.0 years (73.5 ttl pk-yrs)    Types: Cigarettes    Start date: 32    Quit date: 09/14/2017    Years since quitting: 6.8   Smokeless tobacco: Former  Building Services Engineer status: Never Used  Substance Use Topics   Alcohol use: Not Currently    Comment: 6 pack on weekend   Drug use: No     Colonoscopy:  PAP:  Bone density:  Lipid panel:  No Known Allergies  Current Facility-Administered Medications  Medication Dose Route Frequency Provider Last Rate Last Admin   acetaminophen  (TYLENOL ) tablet 1,000 mg  1,000 mg Oral Q6H PRN Lenon Marien CROME, MD       albuterol  (PROVENTIL ) (2.5 MG/3ML) 0.083% nebulizer solution 3 mL  3 mL Nebulization Q4H PRN Lenon Marien CROME, MD       arformoterol  (BROVANA ) nebulizer solution 15 mcg  15 mcg Nebulization BID Lenon Marien CROME, MD   15 mcg at 07/11/24 0819   And   umeclidinium bromide  (INCRUSE ELLIPTA ) 62.5 MCG/ACT 1 puff  1 puff Inhalation Daily Lenon Marien CROME, MD   1 puff at 07/11/24 0936   bisacodyl  (DULCOLAX) EC tablet 5 mg  5 mg Oral Daily PRN Lenon Marien CROME, MD       dexamethasone  (DECADRON ) tablet 4 mg  4 mg Oral BID WC Lenon Marien CROME, MD   4 mg at 07/11/24 0932   midodrine  (PROAMATINE ) tablet 5 mg  5 mg Oral TID WC Lenon Marien CROME, MD   5 mg at 07/11/24 1203   ondansetron  (ZOFRAN ) tablet 4 mg  4 mg Oral Q6H PRN Lenon Marien CROME, MD       Or   ondansetron  (ZOFRAN ) injection 4 mg  4 mg Intravenous Q6H PRN Lenon Marien CROME, MD       oxyCODONE  (Oxy IR/ROXICODONE ) immediate release tablet 5 mg  5 mg Oral Q6H PRN Lenon Marien CROME, MD   5 mg  at 07/11/24 1203   polyethylene glycol (MIRALAX  / GLYCOLAX ) packet 17 g  17 g Oral Daily Lenon Marien CROME, MD   17 g at 07/11/24 0932    OBJECTIVE: Vitals:   07/11/24 0820 07/11/24 0858  BP:  99/73  Pulse:  72  Resp:  16  Temp:  (!) 97.4 F (36.3 C)  SpO2: 93% 96%     Body mass index is 21.09 kg/m.    ECOG FS:4 - Bedbound  General: Well-developed, well-nourished, no acute distress. Eyes: Pink conjunctiva, anicteric sclera. HEENT: Normocephalic, moist mucous membranes. Lungs: No audible wheezing or coughing. Heart: Regular rate and rhythm. Abdomen: Soft, nontender, no obvious distention. Musculoskeletal: No edema, cyanosis, or clubbing. Neuro: Alert, answering all questions appropriately. Cranial  nerves grossly intact. Skin: No rashes or petechiae noted. Psych: Normal affect.  LAB RESULTS:  Lab Results  Component Value Date   NA 140 07/10/2024   K 3.9 07/10/2024   CL 102 07/10/2024   CO2 26 07/10/2024   GLUCOSE 89 07/10/2024   BUN 28 (H) 07/10/2024   CREATININE 0.99 07/10/2024   CALCIUM  9.8 07/10/2024   PROT 7.7 07/10/2024   ALBUMIN 3.5 07/10/2024   AST 19 07/10/2024   ALT 28 07/10/2024   ALKPHOS 101 07/10/2024   BILITOT 1.0 07/10/2024   GFRNONAA >60 07/10/2024   GFRAA 85 12/22/2020    Lab Results  Component Value Date   WBC 9.0 07/10/2024   NEUTROABS 6.7 07/10/2024   HGB 14.9 07/10/2024   HCT 45.1 07/10/2024   MCV 99.1 07/10/2024   PLT 188 07/10/2024     STUDIES: CT Cervical Spine Wo Contrast Result Date: 07/10/2024 EXAM: CT CERVICAL SPINE WITHOUT CONTRAST 07/10/2024 01:09:08 PM TECHNIQUE: CT of the cervical spine was performed without the administration of intravenous contrast. Multiplanar reformatted images are provided for review. Automated exposure control, iterative reconstruction, and/or weight based adjustment of the mA/kV was utilized to reduce the radiation dose to as low as reasonably achievable. COMPARISON: CT cervical spine 06/15/2024.  CLINICAL HISTORY: Neck trauma (Age >= 65y). FINDINGS: CERVICAL SPINE: BONES AND ALIGNMENT: No acute fracture or traumatic malalignment. Straightening/mild reversal of the normal cervical lordosis. Trace degenerative anterolisthesis of C4 on C5. DEGENERATIVE CHANGES: Advanced multilevel facet arthrosis, asymmetrically severe on the right at C2-C3, C3-C4, and C4-C5. Right-sided facet and partial interbody ankylosis at C4-C5. Advanced disc degeneration at C5-C6 and C6-C7. Advanced right neural foraminal stenosis at C3-C4, C4-C5, and C6-C7 due to uncovertebral and facet spurring. Potential moderate spinal stenosis at C6-C7. SOFT TISSUES: No prevertebral soft tissue swelling. IMPRESSION: 1. No acute cervical spine fracture or traumatic malalignment. 2. Advanced multilevel disc and facet degeneration. Electronically signed by: Dasie Hamburg MD 07/10/2024 01:54 PM EST RP Workstation: [id number]   CT Maxillofacial Wo Contrast Result Date: 07/10/2024 EXAM: CT OF THE FACE WITHOUT CONTRAST 07/10/2024 01:09:08 PM TECHNIQUE: CT of the face was performed without the administration of intravenous contrast. Multiplanar reformatted images are provided for review. Automated exposure control, iterative reconstruction, and/or weight based adjustment of the mA/kV was utilized to reduce the radiation dose to as low as reasonably achievable. COMPARISON: CTA head and neck 08/18/2021. CLINICAL HISTORY: Facial trauma, blunt. FINDINGS: FACIAL BONES: Edentulous. No acute facial fracture. No mandibular dislocation. No suspicious bone lesion. ORBITS: Globes are intact. No acute traumatic injury. No inflammatory change. SINUSES AND MASTOIDS: Minimal mucosal thickening in the paranasal sinuses. Clear mastoid air cells. SOFT TISSUES: No acute abnormality. IMPRESSION: 1. No acute facial fracture. Electronically signed by: Dasie Hamburg MD 07/10/2024 01:49 PM EST RP Workstation: [id number]   CT HEAD WO CONTRAST ( ) Result Date: 07/10/2024 EXAM:  CT HEAD WITHOUT CONTRAST 07/10/2024 01:09:08 PM TECHNIQUE: CT of the head was performed without the administration of intravenous contrast. Automated exposure control, iterative reconstruction, and/or weight based adjustment of the mA/kV was utilized to reduce the radiation dose to as low as reasonably achievable. COMPARISON: Head CT and MRI 06/15/2024. CLINICAL HISTORY: Head trauma, minor (Age >= 65y). FINDINGS: BRAIN AND VENTRICLES: No acute infarct, intracranial hemorrhage, midline shift, hydrocephalus, or extra-axial fluid collection is identified. A moderate sized chronic right MCA infarct is again noted with associated ex vacuo dilatation of the right lateral ventricle. Sequelae of suboccipital craniectomy are again identified with encephalomalacia in the inferior left  cerebellar hemisphere. Multiple extra-axial/dural based masses in the posterior fossa were better demonstrated by the prior MRI. The most conspicuous lesions on this noncontrast CT are noted at the level of the tentorial incisura and torcula. Associated mild edema in the inferomedial left parietal lobe and right cerebellar hemisphere has mildly increased. There is no cerebral atrophy. Calcified atherosclerosis at the skull base. ORBITS AND SINUSES : The orbits and sinuses are reported separately on the dedicated maxillofacial CT. SOFT TISSUES AND SKULL: No acute soft tissue abnormality. No skull fracture. IMPRESSION: 1. No evidence of acute intracranial injury. 2. Known intracranial metastatic disease with slightly increased, mild edema in the left parietal lobe and right cerebellar hemisphere. No significant mass effect. 3. Chronic right MCA infarct. Electronically signed by: Dasie Hamburg MD 07/10/2024 01:47 PM EST RP Workstation: [id number]   DG Chest Portable 1 View Result Date: 07/10/2024 CLINICAL DATA:  Cough. EXAM: PORTABLE CHEST 1 VIEW COMPARISON:  06/26/2023 and PET-CT 06/26/2024 FINDINGS: Left jugular Port-A-Cath with the tip near the  superior cavoatrial junction. Lucency in the upper lungs compatible with known emphysema. Both lungs are clear. Right paratracheal density is compatible with known right lung mass. This mass has significantly decreased in size compared to 2024. IMPRESSION: 1. No acute cardiopulmonary disease. 2. Emphysema. Electronically Signed   By: Juliene Balder M.D.   On: 07/10/2024 13:12   NM PET Image Restag (PS) Skull Base To Thigh Result Date: 06/27/2024 CLINICAL DATA:  Subsequent treatment strategy for lung cancer. EXAM: NUCLEAR MEDICINE PET SKULL BASE TO THIGH TECHNIQUE: 8.0 mCi F-18 FDG was injected intravenously. Full-ring PET imaging was performed from the skull base to thigh after the radiotracer. CT data was obtained and used for attenuation correction and anatomic localization. Fasting blood glucose: 117 mg/dl COMPARISON:  CT chest abdomen pelvis 01/18/2024 and PET 08/17/2023. FINDINGS: Mediastinal blood pool activity: SUV max 2.2 Liver activity: SUV max NA NECK: Hypermetabolic lesions in the posterior fossa with index lesion anterior to the brainstem, SUV max 14.1. No additional abnormal hypermetabolism. Incidental CT findings: Right temporal encephalomalacia.  Cerebral atrophy. CHEST: Right lower lobe mass straddles the upper right major fissure measures 3.2 x 4.2 cm, has decreased in size from 3.6 x 4.5 cm with minimal residual focal hypermetabolism along the medial margin, SUV max 3.6. Minimally hypermetabolic low right paratracheal lymph node measures 8 mm (SUV max 2.7. No additional abnormal hypermetabolism. Incidental CT findings: Left IJ Port-A-Cath terminates in the right atrium. Atherosclerotic calcification of the aorta and coronary arteries. Trace pericardial effusion. Centrilobular and paraseptal emphysema. ABDOMEN/PELVIS: No abnormal hypermetabolism. Incidental CT findings: Gallstones. SKELETON: No abnormal hypermetabolism. Incidental CT findings: Postoperative changes in the lumbar spine. Degenerative  changes in the spine. IMPRESSION: 1. New hypermetabolic brain metastases, as on MR brain 06/15/2024. 2. Right lower lobe mass has decreased slightly in size in the interval with only a tiny focal area of residual hypermetabolism along the medial margin. Small mildly hypermetabolic low right paratracheal lymph node. 3. Trace pericardial effusion. 4. Cholelithiasis. 5. Aortic atherosclerosis (ICD10-I70.0). Coronary artery calcification. 6.  Emphysema (ICD10-J43.9). Electronically Signed   By: Newell Eke M.D.   On: 06/27/2024 16:48   MR Brain W and Wo Contrast Result Date: 06/15/2024 EXAM: MRI BRAIN WITH AND WITHOUT CONTRAST 06/15/2024 07:57:31 PM TECHNIQUE: Multiplanar multisequence MRI of the head/brain was performed with and without the administration of intravenous contrast. COMPARISON: MRI head 02/21/2024. Same day CT head. CLINICAL HISTORY: Abnormal CT head, history of prior brain tumor s/p resection. FINDINGS: BRAIN AND  VENTRICLES: Multiple new leptomeningeal enhancing masses in the posterior fossa. This includes : 2.5 x 1.9 cm mass along the anterior aspect of the left posterolateral leaflet at the tentorial incisura (series 20 image 13). 1 cm enhancing mass along the posterior midline cerebellum (series 18, image 54 and series 20, image 13 ). Adjacent edema in left occipital lobe and superior cerebllum. 1 cm enhancing mass along the posterior right cerebellum (series 18 image 47). 1.7 cm enhancing mass along the inferior and anterior right cerebellum (series 18, image 29). Adjcacent cerebellar edema. 1.7 cm enhancing mass along the anterior left paramidline basal cistern anterior to the basilar artery (series 18, image 39). Adjacent cerebellar edema. No acute infarct. Remote right frontal infarct. No acute intracranial hemorrhage.  No hydrocephalus. Normal flow voids. ORBITS: No acute abnormality. SINUSES: No acute abnormality. BONES AND SOFT TISSUES: Left suboccipital craniotomy. IMPRESSION: 1.  Multiple new leptomeningeal enhancing masses in the posterior fossa, detailed above and concerning for metastatic deposits given the history. 2. Adjacent brain edema in the cerebellum and occipital lobe without significant mass effect. Electronically signed by: Gilmore Molt MD 06/15/2024 08:42 PM EDT RP Workstation: [id number]   CT Cervical Spine Wo Contrast Result Date: 06/15/2024 CLINICAL DATA:  Fall EXAM: CT CERVICAL SPINE WITHOUT CONTRAST TECHNIQUE: Multidetector CT imaging of the cervical spine was performed without intravenous contrast. Multiplanar CT image reconstructions were also generated. RADIATION DOSE REDUCTION: This exam was performed according to the departmental dose-optimization program which includes automated exposure control, adjustment of the mA and/or kV according to patient size and/or use of iterative reconstruction technique. COMPARISON:  None Available. FINDINGS: Alignment: Normal. Skull base and vertebrae: No acute fracture. No primary bone lesion or focal pathologic process. Soft tissues and spinal canal: No prevertebral fluid or swelling. No visible canal hematoma. Disc levels: Disc space narrowing and spurring most notable at C5-6 and C6-7. Moderate to advanced bilateral degenerative facet disease, right greater than left. Multilevel bilateral neural foraminal narrowing, right greater than left. Upper chest: Emphysema. Other: None IMPRESSION: Multilevel degenerative disc and facet disease. No acute bony abnormality. Electronically Signed   By: Franky Crease M.D.   On: 06/15/2024 18:22   CT Head Wo Contrast Result Date: 06/15/2024 CLINICAL DATA:  Fall, headache EXAM: CT HEAD WITHOUT CONTRAST TECHNIQUE: Contiguous axial images were obtained from the base of the skull through the vertex without intravenous contrast. RADIATION DOSE REDUCTION: This exam was performed according to the departmental dose-optimization program which includes automated exposure control, adjustment of the  mA and/or kV according to patient size and/or use of iterative reconstruction technique. COMPARISON:  01/18/2024 FINDINGS: Brain: Encephalomalacia noted in the inferior left cerebellum with overlying craniotomy defect. This is in the area of prior cerebellar mass, now resected. Stable encephalomalacia in the right MCA territory. Mild atrophy. No acute infarct or hemorrhage. Slightly hyperdense mass now noted to the left of midline measuring 1.9 x 1 cm on image 17. this appears to be dural-based along the undersurface of the left tentorium. Also, in the adjacent medial left occipital lobe, there is subcortical edema concerning for possible mass lesion. Vascular: No hyperdense vessel or unexpected calcification. Skull: No acute calvarial abnormality. Prior left lower occipital craniotomy. Sinuses/Orbits: No acute findings Other: None IMPRESSION: 1.9 x 1 cm slightly hyperdense dural-based mass noted along the undersurface of the left tentorium near the midline. There also appears to be subcortical edema in the medial left occipital lobe concerning for underlying mass lesion. Recommend further evaluation with MRI with and without  contrast. Electronically Signed   By: Franky Crease M.D.   On: 06/15/2024 18:20   DG Shoulder Left Result Date: 06/15/2024 CLINICAL DATA:  Fall, left shoulder pain EXAM: LEFT SHOULDER - 2+ VIEW COMPARISON:  None Available. FINDINGS: Degenerative changes in the [facility] joint with joint space narrowing and spurring. Glenohumeral joint is maintained. No acute bony abnormality. Specifically, no fracture, subluxation, or dislocation. Soft tissues are intact. IMPRESSION: Degenerative changes in the left AC joint. No acute bony abnormality. Electronically Signed   By: Franky Crease M.D.   On: 06/15/2024 18:11    ASSESSMENT: Recurrent stage IV adenocarcinoma of the right lung now with brain metastasis.  PLAN:    Recurrent stage IV adenocarcinoma of the right lung now with new brain metastasis:  Patient has now completed approximately 2 weeks of a planned 3-week course of palliative whole brain radiation for his metastatic disease.  PET scan results from June 26, 2024 did not reveal any evidence of significant recurrence elsewhere in the body.  Given patient's declining performance status and increased confusion, he has elected to pursue hospice care at home.  Radiation treatments are scheduled to restart next week, but will await evaluation by hospice team to determine whether these are necessary.  Continue steroids as prescribed.  No follow-up is necessary for medical oncology.  Appreciate palliative care input.  Appreciate consult, will follow.  Evalene JINNY Reusing, MD   07/11/2024 2:44 PM

## 2024-07-11 NOTE — Progress Notes (Signed)
 [facility], Room [address] [facility] Liaison Note  Received request from Jackquline Shove, RN , Transitions of Care Manager, for hospice services at home after discharge.  Spoke with patient and spouse to initiate education related to hospice philosophy, services, and team approach to care.  Patient/family verbalized understanding of information given.    DME needs discussed.  Patient has the following equipment in the home: Adjustable bed, BSC, walker with wheels and seat, shower bench.    Patient/family requests the following equipment in the home: none  The address has been verified and is correct in the chart.    Please send signed and completed DNR home with the patient/family.  Please provide prescriptions at discharge as needed to ensure ongoing symptom management.   [facility] information and contact numbers given to spouse.   Above information shared with Jackquline Shove, RN, Transitions of Care Manager.     Please call with any Hospice related questions or concerns.  Thank you for the opportunity to participate in this patient's care.  Marinell Nova, [facility] Liaison [phone]

## 2024-07-12 ENCOUNTER — Ambulatory Visit

## 2024-07-12 DIAGNOSIS — C3490 Malignant neoplasm of unspecified part of unspecified bronchus or lung: Secondary | ICD-10-CM

## 2024-07-12 DIAGNOSIS — R627 Adult failure to thrive: Secondary | ICD-10-CM | POA: Diagnosis not present

## 2024-07-12 MED ORDER — DEXAMETHASONE 2 MG PO TABS: 4.0000 mg | ORAL_TABLET | Freq: Two times a day (BID) | ORAL | Status: DC

## 2024-07-12 NOTE — Discharge Instructions (Signed)
 I'm glad that you are feeling better!  Hospice will arrange your home services and commode delivery.  Please continue your steroid at the higher dose- 4mg  in the morning and 4mg  in the evening until you have followed up with oncology next week. They may continue or change the dose depending on how you are doing.

## 2024-07-12 NOTE — Progress Notes (Incomplete)
 PROGRESS NOTE Ethan Fitzgerald    DOB: 11/01/1955, 68 y.o.  FMW:[id number]    Code Status: Do not attempt resuscitation (DNR) PRE-ARREST INTERVENTIONS DESIRED   DOA: 07/10/2024   LOS: 0  Brief hospital course  Ethan Fitzgerald is a 69 y.o. 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 At baseline, they live at home with their wife and are mostly independent with their ADLs.  Typically walks with a cane or walker.   They presented from home to the ED on 07/10/2024 with inability to get up from bed x 2 days.  Patient was scheduled to begin third out of 3 weeks of radiation of his brain metastases today with Ethan Fitzgerald and was scheduled to meet with Ethan Fitzgerald and Ethan Fitzgerald tomorrow to discuss home hospice.  He was unable to get out of bed due to significant dizziness with even minimal movement.  The last time he was able to get out of bed was Saturday or Sunday.  Today, he was unable to even sit up at the bedside to urinate without significant dizziness.  He had a fall proximately 3 days ago that resulted in ecchymosis surrounding left eye and no other severe injuries.  Has known orthostatic hypotension and takes midodrine . Has history of prior stroke with left-sided residual deficits which he and wife state are unchanged from his baseline. Prior to this, he completed 2 weeks of radiation and was able to walk with a cane or a walker with progressively worsening tolerance and dizziness.  He has been experiencing significant scalp pain from the radiation which he has been treating with oxycodone .  He denies any nausea or vomiting. He states that he has had a very good appetite and even gained weight since he was started on steroids.  His wife is unable to help him sit up in bed so he is unable to use bedside commode or go to his follow-up appointments without her assistance at this  time.   In the ED, it was found that they had stable vital signs.  Significant findings included: Unremarkable CBC, unremarkable CMP, negative troponin x 2, negative for COVID, influenza, RSV.  Urinalysis normal.  CT of face, C-spine, head unremarkable for acute injuries.  Head CT is positive for slightly increased size of the known intracranial metastatic disease as well as mild edema in the left parietal lobe and right cerebellar hemisphere without significant mass effect.  Evidence of his chronic right MCA infarct was also seen.   They were initially treated with NS bolus.    Patient was admitted to medicine service for further workup and management of failure to thrive as outlined in detail below.  07/12/24 -improved clinical status. Electing to pursue hospice care at this time  Assessment & Plan  Principal Problem:   Failure to thrive in adult  Stage IV adenocarcinoma of the right lung with brain metastasis-previously completed XRT and CarboTaxol chemotherapy with maintenance durvalumab  completed on May 2025.  Underwent surgical resection of isolated brain met in July 2025.  Now showing progressive left meningeal masses.  CT scan on presentation worrisome for slight increase in metastatic disease with edema in left parietal lobe and right cerebellar hemisphere despite being on steroids and undergoing whole brain radiation.  Completed 2 out of the 3 weeks and was scheduled to start his third week of radiation today but unable due to significant dizziness likely  related to that metastatic disease and edema. - Increasing steroid dose to address the increased edema.  There is no mass effect appreciated on imaging.  Due to described interference with sleep, will consider changing dose to morning dose only. - Consulted palliative, Ethan Fitzgerald who was scheduled to meet with patient outpatient tomorrow to discuss possible home and hospice services.  Dr. [name] office has been made aware of his  admission.  Family is interested in meeting with hospice and discussing discharging home with their service - Will discuss with Dr. [name] office in regards to ongoing radiation given side effects and does not appear to be responding.  Patient and wife both indicated that they were leaning towards not continuing. - Continue home analgesia including Tylenol  and oxycodone  as needed - If tolerated and symptoms improve can consider physical therapy consult patient is too dizzy to participate at this time. - Can consider meclizine   COPD-oxygen saturations stable on room air without wheezing or respiratory complaints-  - Continue home inhaler prior formulary as well as as needed albuterol    Orthostatic hypotension-blood pressures have been stable at rest - Continue home midodrine   Body mass index is 21.09 kg/m.  VTE ppx: SCDs Start: 07/10/24 1617  Diet:     Diet   Diet regular Room service appropriate? Yes; Fluid consistency: Thin   Consultants: Palliative Oncology   Subjective 07/12/24    Pt reports feeling alright today. Dizziness continues but to slightly lesser degree. Head pain is moderately well controlled currently. Plans to continue to pursue radiation.    Objective  Blood pressure 107/73, pulse 84, temperature 98.2 F (36.8 C), temperature source Oral, resp. rate 18, height 5' 11 (1.803 m), weight 68.6 kg, SpO2 98%.  Intake/Output Summary (Last 24 hours) at 07/12/2024 0717 Last data filed at 07/11/2024 1900 Gross per 24 hour  Intake 510 ml  Output --  Net 510 ml   Filed Weights   07/10/24 1145  Weight: 68.6 kg     Physical Exam:  General: awake, alert, NAD Respiratory: normal respiratory effort. Cardiovascular: extremities well perfused Nervous: A&O x3. no gross focal neurologic deficits, normal speech Extremities: moves all equally, no edema, normal tone Skin: dry, intact, normal temperature, normal color. No rashes, lesions or ulcers on exposed  skin Psychiatry: normal mood, congruent affect  Labs   I have personally reviewed the following labs and imaging studies CBC    Component Value Date/Time   WBC 9.0 07/10/2024 1146   RBC 4.55 07/10/2024 1146   HGB 14.9 07/10/2024 1146   HGB 14.2 03/12/2024 1016   HCT 45.1 07/10/2024 1146   PLT 188 07/10/2024 1146   PLT 194 03/12/2024 1016   MCV 99.1 07/10/2024 1146   MCH 32.7 07/10/2024 1146   MCHC 33.0 07/10/2024 1146   RDW 14.5 07/10/2024 1146   LYMPHSABS 1.1 07/10/2024 1146   MONOABS 0.4 07/10/2024 1146   EOSABS 0.3 07/10/2024 1146   BASOSABS 0.1 07/10/2024 1146      Latest Ref Rng & Units 07/10/2024   11:46 AM 06/15/2024    5:10 PM 01/18/2024    5:19 PM  BMP  Glucose 70 - 99 mg/dL 89  96  880   BUN 8 - 23 mg/dL 28  25  20    Creatinine 0.61 - 1.24 mg/dL 9.00  8.82  9.10   Sodium 135 - 145 mmol/L 140  134  138   Potassium 3.5 - 5.1 mmol/L 3.9  3.5  4.1   Chloride 98 -  111 mmol/L 102  102  108   CO2 22 - 32 mmol/L 26  20  24    Calcium  8.9 - 10.3 mg/dL 9.8  89.9  9.8     CT Cervical Spine Wo Contrast Result Date: 07/10/2024 EXAM: CT CERVICAL SPINE WITHOUT CONTRAST 07/10/2024 01:09:08 PM TECHNIQUE: CT of the cervical spine was performed without the administration of intravenous contrast. Multiplanar reformatted images are provided for review. Automated exposure control, iterative reconstruction, and/or weight based adjustment of the mA/kV was utilized to reduce the radiation dose to as low as reasonably achievable. COMPARISON: CT cervical spine 06/15/2024. CLINICAL HISTORY: Neck trauma (Age >= 65y). FINDINGS: CERVICAL SPINE: BONES AND ALIGNMENT: No acute fracture or traumatic malalignment. Straightening/mild reversal of the normal cervical lordosis. Trace degenerative anterolisthesis of C4 on C5. DEGENERATIVE CHANGES: Advanced multilevel facet arthrosis, asymmetrically severe on the right at C2-C3, C3-C4, and C4-C5. Right-sided facet and partial interbody ankylosis at C4-C5.  Advanced disc degeneration at C5-C6 and C6-C7. Advanced right neural foraminal stenosis at C3-C4, C4-C5, and C6-C7 due to uncovertebral and facet spurring. Potential moderate spinal stenosis at C6-C7. SOFT TISSUES: No prevertebral soft tissue swelling. IMPRESSION: 1. No acute cervical spine fracture or traumatic malalignment. 2. Advanced multilevel disc and facet degeneration. Electronically signed by: Dasie Hamburg MD 07/10/2024 01:54 PM EST RP Workstation: [id number]   CT Maxillofacial Wo Contrast Result Date: 07/10/2024 EXAM: CT OF THE FACE WITHOUT CONTRAST 07/10/2024 01:09:08 PM TECHNIQUE: CT of the face was performed without the administration of intravenous contrast. Multiplanar reformatted images are provided for review. Automated exposure control, iterative reconstruction, and/or weight based adjustment of the mA/kV was utilized to reduce the radiation dose to as low as reasonably achievable. COMPARISON: CTA head and neck 08/18/2021. CLINICAL HISTORY: Facial trauma, blunt. FINDINGS: FACIAL BONES: Edentulous. No acute facial fracture. No mandibular dislocation. No suspicious bone lesion. ORBITS: Globes are intact. No acute traumatic injury. No inflammatory change. SINUSES AND MASTOIDS: Minimal mucosal thickening in the paranasal sinuses. Clear mastoid air cells. SOFT TISSUES: No acute abnormality. IMPRESSION: 1. No acute facial fracture. Electronically signed by: Dasie Hamburg MD 07/10/2024 01:49 PM EST RP Workstation: [id number]   CT HEAD WO CONTRAST ( ) Result Date: 07/10/2024 EXAM: CT HEAD WITHOUT CONTRAST 07/10/2024 01:09:08 PM TECHNIQUE: CT of the head was performed without the administration of intravenous contrast. Automated exposure control, iterative reconstruction, and/or weight based adjustment of the mA/kV was utilized to reduce the radiation dose to as low as reasonably achievable. COMPARISON: Head CT and MRI 06/15/2024. CLINICAL HISTORY: Head trauma, minor (Age >= 65y). FINDINGS: BRAIN AND  VENTRICLES: No acute infarct, intracranial hemorrhage, midline shift, hydrocephalus, or extra-axial fluid collection is identified. A moderate sized chronic right MCA infarct is again noted with associated ex vacuo dilatation of the right lateral ventricle. Sequelae of suboccipital craniectomy are again identified with encephalomalacia in the inferior left cerebellar hemisphere. Multiple extra-axial/dural based masses in the posterior fossa were better demonstrated by the prior MRI. The most conspicuous lesions on this noncontrast CT are noted at the level of the tentorial incisura and torcula. Associated mild edema in the inferomedial left parietal lobe and right cerebellar hemisphere has mildly increased. There is no cerebral atrophy. Calcified atherosclerosis at the skull base. ORBITS AND SINUSES : The orbits and sinuses are reported separately on the dedicated maxillofacial CT. SOFT TISSUES AND SKULL: No acute soft tissue abnormality. No skull fracture. IMPRESSION: 1. No evidence of acute intracranial injury. 2. Known intracranial metastatic disease with slightly increased, mild edema in the left  parietal lobe and right cerebellar hemisphere. No significant mass effect. 3. Chronic right MCA infarct. Electronically signed by: Dasie Hamburg MD 07/10/2024 01:47 PM EST RP Workstation: [id number]   DG Chest Portable 1 View Result Date: 07/10/2024 CLINICAL DATA:  Cough. EXAM: PORTABLE CHEST 1 VIEW COMPARISON:  06/26/2023 and PET-CT 06/26/2024 FINDINGS: Left jugular Port-A-Cath with the tip near the superior cavoatrial junction. Lucency in the upper lungs compatible with known emphysema. Both lungs are clear. Right paratracheal density is compatible with known right lung mass. This mass has significantly decreased in size compared to 2024. IMPRESSION: 1. No acute cardiopulmonary disease. 2. Emphysema. Electronically Signed   By: Juliene Balder M.D.   On: 07/10/2024 13:12    Disposition Plan & Communication  Patient  status: Observation  Admitted From: Home Planned disposition location: Hospice care Anticipated discharge date: 11/6 pending equipment delivery  Family Communication: wife on phone    Author: Marien LITTIE Piety, DO [facility] 07/12/2024, 7:17 AM   Available by Epic secure chat 7AM-7PM. If 7PM-7AM, please contact night-coverage.  [facility] contact information found on [email or web address].

## 2024-07-12 NOTE — Evaluation (Signed)
 Occupational Therapy Evaluation Patient Details Name: Ethan Fitzgerald MRN: [id number] DOB: 10-Jun-1956 Today's Date: 07/12/2024   History of Present Illness   Pt is a 68 y.o. male admitted with FTT and inability to get out of bed ~3x days. PMH significant for recurrent stage IV adenocarcinoma of the right lung with new brain metastasis, COPD, CVA with L-sided deficits, HTN, SAH, tobacco dependence, orthostatic hypotension     Clinical Impressions Pt admitted with above. Pt lives with spouse, who is able to provide 24/7 supervision and has 2 STE one level home. Typically, he is able to perform ADLs mod independently, SPC vs 4WW for mobility with 1 recent fall. Functional abilities fluctuate based on how weak pt is feeling, and spouse assists PRN. Pt has hx of orthostatic hypotension, midodrine  received prior to session, reports mild dizziness once sitting at EOB that resolves. Pt completes UB/LB dressing from STS at supervision level, noted L-sided deficits from prior CVA.   Pt would benefit from skilled OT services to address noted impairments and functional limitations (see below for any additional details) in order to maximize safety and independence while minimizing falls risk and caregiver burden. Pt would additionally benefit from [facility] aide to assist with ADLs to decrease caregiver burden. Anticipate the need for follow up [facility] OT services upon acute hospital DC.      If plan is discharge home, recommend the following:   A little help with walking and/or transfers;A little help with bathing/dressing/bathroom;Assist for transportation;Help with stairs or ramp for entrance     Functional Status Assessment   Patient has had a recent decline in their functional status and demonstrates the ability to make significant improvements in function in a reasonable and predictable amount of time.     Equipment Recommendations   Wheelchair (measurements OT);Wheelchair cushion (measurements  OT);BSC/3in1     Recommendations for Other Services    HH aide      Precautions/Restrictions   Precautions Precautions: Fall Precaution/Restrictions Comments: orthostatic hypotension, chronic L-sided deficits from old CVA Restrictions Weight Bearing Restrictions Per Provider Order: No     Mobility Bed Mobility Overal bed mobility: Modified Independent             General bed mobility comments: HOB elevated (adjustable bed at home)    Transfers Overall transfer level: Needs assistance Equipment used: None Transfers: Sit to/from Stand Sit to Stand: Supervision           General transfer comment: anticipate pt will need AD for increased stability      Balance Overall balance assessment: History of Falls, Needs assistance Sitting-balance support: No upper extremity supported, Feet supported Sitting balance-Leahy Scale: Good Sitting balance - Comments: good dynamic balance   Standing balance support: No upper extremity supported, During functional activity Standing balance-Leahy Scale: Fair Standing balance comment: increased postural sway without AD use                           ADL either performed or assessed with clinical judgement   ADL Overall ADL's : Needs assistance/impaired Eating/Feeding: Independent;Sitting               Upper Body Dressing : Sitting;Supervision/safety Upper Body Dressing Details (indicate cue type and reason): doff gown, don shirt Lower Body Dressing: Supervision/safety;Sit to/from stand Lower Body Dressing Details (indicate cue type and reason): don pants from STS Toilet Transfer: Contact guard assist;BSC/3in1;Rolling walker (2 wheels) Toilet Transfer Details (indicate cue type and reason): clinical  judgement         Functional mobility during ADLs: Contact guard assist;Supervision/safety General ADL Comments: pt appears to be at or near baseline for ADL performance      Pertinent Vitals/Pain Pain  Assessment Pain Assessment: No/denies pain     Extremity/Trunk Assessment Upper Extremity Assessment Upper Extremity Assessment: Right hand dominant;LUE deficits/detail LUE Deficits / Details: L sided deficits from prior CVA, impaired FMC/GMC but functional   Lower Extremity Assessment Lower Extremity Assessment: Defer to PT evaluation   Cervical / Trunk Assessment Cervical / Trunk Assessment: Normal   Communication Communication Communication: No apparent difficulties   Cognition Arousal: Alert Behavior During Therapy: Flat affect, Impulsive                                 Following commands: Intact       Cueing  General Comments   Cueing Techniques: Verbal cues  L eye bruising from fall. Mild dizziness sitting EOB with BP soft, hx of orthostatic hypotension           Home Living Family/patient expects to be discharged to:: Private residence Living Arrangements: Spouse/significant other Available Help at Discharge: Available 24 hours/day;Family Type of Home: House       Home Layout: One level     Bathroom Shower/Tub: Producer, Television/film/video: Standard     Home Equipment: Rollator (4 wheels);BSC/3in1;Tub bench;Hospital bed;Cane - single point          Prior Functioning/Environment Prior Level of Function : Needs assist;Independent/Modified Independent;History of Falls (last six months)             Mobility Comments: pt states SPC vs 4WW depending on the day, spouse occassionally helps with mobility / transfers ADLs Comments: typically independent but spouse assists PRN    OT Problem List: Decreased strength;Decreased range of motion;Decreased activity tolerance;Impaired balance (sitting and/or standing);Decreased safety awareness   OT Treatment/Interventions: Self-care/ADL training;Therapeutic exercise;Neuromuscular education;Energy conservation;Balance training;Patient/family education;DME and/or AE instruction;Therapeutic  activities      OT Goals(Current goals can be found in the care plan section)   Acute Rehab OT Goals OT Goal Formulation: With patient Time For Goal Achievement: 07/26/24 Potential to Achieve Goals: Good   OT Frequency:  Min 2X/week       AM-PAC OT 6 Clicks Daily Activity     Outcome Measure Help from another person eating meals?: None Help from another person taking care of personal grooming?: None Help from another person toileting, which includes using toliet, bedpan, or urinal?: A Little Help from another person bathing (including washing, rinsing, drying)?: A Little Help from another person to put on and taking off regular upper body clothing?: None Help from another person to put on and taking off regular lower body clothing?: None 6 Click Score: 22   End of Session Nurse Communication: Mobility status  Activity Tolerance: Patient tolerated treatment well Patient left: in bed;with call bell/phone within reach;with bed alarm set  OT Visit Diagnosis: Unsteadiness on feet (R26.81);Muscle weakness (generalized) (M62.81);History of falling (Z91.81);Adult, failure to thrive (R62.7)                Time: 9153-9090 OT Time Calculation (min): 23 min Charges:  OT General Charges $OT Visit: 1 Visit OT Evaluation $OT Eval Low Complexity: 1 Low OT Treatments $Self Care/Home Management : 8-22 mins  Raeanne Deschler L. Olyver Hawes, OTR/L  07/12/24, 10:14 AM

## 2024-07-12 NOTE — Evaluation (Signed)
 Physical Therapy Evaluation Patient Details Name: Ethan Fitzgerald MRN: [id number] DOB: 1955-12-29 Today's Date: 07/12/2024  History of Present Illness  Pt is a 68 y.o. male admitted with FTT and inability to get out of bed ~3x days. 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  He was able to ambulate with and w/o AD (though more steady with AD) and was able to negotiate up/down steps w/o direct assist.  Pt is not far from baseline, discussed with pt and wife.  No further PT needs.       If plan is discharge home, recommend the following: Assist for transportation   Can travel by private vehicle        Equipment Recommendations None recommended by PT  Recommendations for Other Services       Functional Status Assessment Patient has not had a recent decline in their functional status     Precautions / Restrictions Precautions Precautions: Fall Restrictions Weight Bearing Restrictions Per Provider Order: No      Mobility  Bed Mobility Overal bed mobility: Modified Independent             General bed mobility comments: easily transitions up to sitting EOB w/o assist    Transfers Overall transfer level: Needs assistance Equipment used: Rolling walker (2 wheels) Transfers: Sit to/from Stand Sit to Stand: Supervision           General transfer comment: Pt able to transition to standing w/o assist    Ambulation/Gait Ambulation/Gait assistance: Supervision Gait Distance (Feet): 300 Feet Assistive device: Rolling walker (2 wheels), None         General Gait Details: Pt was able to ambualte ~150 ft w/ RW and ~155ft without AD.  He had some increased speed and confidence w/ AD, but ultimately he reports he is not far from  his baseline and generally is feeling well. Mild hesitancy at times w/o LOBs.  Stairs Stairs: Yes Stairs assistance: Contact guard assist Stair Management: One rail Right Number of Stairs: 5 General stair comments: Pt was able to negotiate up/down a few steps w/o direct assist, good confidence with rail use  Wheelchair Mobility     Tilt Bed    Modified Rankin (Stroke Patients Only)       Balance Overall balance assessment: History of Falls, Needs assistance Sitting-balance support: No upper extremity supported, Feet supported Sitting balance-Leahy Scale: Good     Standing balance support: No upper extremity supported, Bilateral upper extremity supported Standing balance-Leahy Scale: Good Standing balance comment: good with walker, fair w/o UE support                             Pertinent Vitals/Pain Pain Assessment Pain Assessment: No/denies pain    Home Living Family/patient expects to be discharged to:: Private residence Living Arrangements: Spouse/significant other Available Help at Discharge: Available 24 hours/day;Family Type of Home: House Home Access: Stairs to enter   [employer] of Steps: 2   Home Layout: One level Home Equipment: Rollator (4 wheels);BSC/3in1;Tub bench;Hospital bed;Cane - single point      Prior Function Prior Level of Function : Needs assist;Independent/Modified Independent;History of Falls (last six months)  Mobility Comments: pt states SPC vs 4WW depending on the day, spouse occassionally helps with mobility / transfers ADLs Comments: typically independent but spouse assists PRN     Extremity/Trunk Assessment   Upper Extremity Assessment Upper Extremity Assessment: Generalized weakness;Overall WFL for tasks assessed (h/o L side deficits from prior infarct) LUE Deficits / Details: L sided deficits from prior CVA, impaired FMC/GMC but functional    Lower Extremity Assessment Lower Extremity  Assessment: Overall WFL for tasks assessed;Generalized weakness    Cervical / Trunk Assessment Cervical / Trunk Assessment: Normal  Communication   Communication Communication: No apparent difficulties    Cognition Arousal: Alert Behavior During Therapy: Flat affect, WFL for tasks assessed/performed                             Following commands: Intact       Cueing Cueing Techniques: Verbal cues     General Comments General comments (skin integrity, edema, etc.): L eye bruising from fall. Mild dizziness sitting EOB with BP soft, hx of orthostatic hypotension    Exercises     Assessment/Plan    PT Assessment Patient does not need any further PT services  PT Problem List         PT Treatment Interventions      PT Goals (Current goals can be found in the Care Plan section)  Acute Rehab PT Goals Patient Stated Goal: go home today PT Goal Formulation: All assessment and education complete, DC therapy    Frequency       Co-evaluation               AM-PAC PT 6 Clicks Mobility  Outcome Measure Help needed turning from your back to your side while in a flat bed without using bedrails?: None Help needed moving from lying on your back to sitting on the side of a flat bed without using bedrails?: None Help needed moving to and from a bed to a chair (including a wheelchair)?: None Help needed standing up from a chair using your arms (e.g., wheelchair or bedside chair)?: None Help needed to walk in hospital room?: None Help needed climbing 3-5 steps with a railing? : None 6 Click Score: 24    End of Session Equipment Utilized During Treatment: Gait belt Activity Tolerance: Patient tolerated treatment well Patient left: with call bell/phone within reach;with chair alarm set Nurse Communication: Mobility status PT Visit Diagnosis: Muscle weakness (generalized) (M62.81)    Time: 9084-9062 PT Time Calculation (min) (ACUTE ONLY): 22 min   Charges:    PT Evaluation $PT Eval Low Complexity: 1 Low   PT General Charges $$ ACUTE PT VISIT: 1 Visit         Carmin JONELLE Deed, DPT 07/12/2024, 11:02 AM

## 2024-07-12 NOTE — Plan of Care (Signed)
  Problem: Clinical Measurements: Goal: Ability to maintain clinical measurements within normal limits will improve Outcome: Progressing   Problem: Activity: Goal: Risk for activity intolerance will decrease Outcome: Progressing   Problem: Pain Managment: Goal: General experience of comfort will improve and/or be controlled Outcome: Progressing   Problem: Safety: Goal: Ability to remain free from injury will improve Outcome: Progressing   Problem: Skin Integrity: Goal: Risk for impaired skin integrity will decrease Outcome: Progressing

## 2024-07-12 NOTE — Discharge Summary (Signed)
 Physician Discharge Summary  Patient: Ethan Fitzgerald FMW:[id number] DOB: [birth year]   Code Status: Do not attempt resuscitation (DNR) PRE-ARREST INTERVENTIONS DESIRED Admit date: 07/10/2024 Discharge date: 07/12/2024 Disposition: Hospice care, PT, OT, nurse aid, and RN PCP: [name], NP  Recommendations for Outpatient Follow-up:  Follow up with PCP within 1-2 weeks Regarding general hospital follow up and preventative care Follow up with oncology, palliative  Discharge Diagnoses:  Principal Problem:   Failure to thrive in adult Active Problems:   Primary malignant neoplasm of lung metastatic to other site [facility])  Brief Hospital Course Summary: Ethan Fitzgerald is a 68 y.o. 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   They presented from home to the ED on 07/10/2024 with inability to get up from bed x 2 days.  He was scheduled to start 3rd week of radiation to brain but instead came to ED.   In the ED, it was found that they had stable vital signs.  Significant findings included: Unremarkable CBC, unremarkable CMP, negative troponin x 2, negative for COVID, influenza, RSV.  Urinalysis normal.  CT of face, C-spine, head unremarkable for acute injuries.  Head CT is positive for slightly increased size of the known intracranial metastatic disease as well as mild edema in the left parietal lobe and right cerebellar hemisphere without significant mass effect.  Evidence of his chronic right MCA infarct was also seen.   They were initially treated with NS bolus.    Patient was admitted to medicine service for further workup and management of failure to thrive as outlined in detail below.  I increased his steroids for the edema and consulted oncology.  With supportive care, his symptoms improved and was able to ambulate in the hallway independently. The  higher dose of steroids was continued at dc and can be reviewed at follow up for weaning.   He wants to continue aggressive treatment for his cancer at this time so will follow up outpatient with oncology.    All other chronic conditions were treated with home medications.    Discharge Condition: Good, improved Recommended discharge diet: Regular healthy diet  Consultations: Oncology, palliative   Procedures/Studies: None   Allergies as of 07/12/2024   No Known Allergies      Medication List     STOP taking these medications    baclofen  10 MG tablet Commonly known as: LIORESAL    fluticasone  50 MCG/ACT nasal spray Commonly known as: FLONASE        TAKE these medications    acetaminophen  500 MG tablet Commonly known as: TYLENOL  Take 2 tablets (1,000 mg total) by mouth every 6 (six) hours as needed. What changed: Another medication with the same name was removed. Continue taking this medication, and follow the directions you see here.   albuterol  108 (90 Base) MCG/ACT inhaler Commonly known as: VENTOLIN  HFA Inhale 2 puffs into the lungs every 4 (four) hours as needed for wheezing or shortness of breath.   aspirin  EC 81 MG tablet Take 1 tablet (81 mg total) by mouth daily. Swallow whole.   b complex vitamins capsule Take 1 capsule by mouth daily.   dexamethasone  2 MG tablet Commonly known as: DECADRON  Take 2 tablets (4 mg total) by mouth 2 (two) times daily with a meal. What changed: how much to take   ezetimibe  10 MG tablet Commonly known as: Zetia  Take 1 tablet (10  mg total) by mouth daily.   IMMUNE SUPPORT VITAMIN C PO Take 1 tablet by mouth daily.   midodrine  5 MG tablet Commonly known as: PROAMATINE  TAKE 1 TABLET BY MOUTH 3 TIMES DAILY WITH MEALS.   oxyCODONE  5 MG immediate release tablet Commonly known as: Oxy IR/ROXICODONE  Take 1 tablet (5 mg total) by mouth every 6 (six) hours as needed for severe pain (pain score 7-10).   sildenafil  20 MG  tablet Commonly known as: REVATIO  TAKE 1 TO 5 TABLETS BY MOUTH ABOUT 30 MINUTES PRIOR TO SEX.START WITH 1 THEN INCREASE   Stiolto Respimat  2.5-2.5 MCG/ACT Aers Generic drug: Tiotropium Bromide -Olodaterol INHALE 2 PUFFS EVERY DAY   Vitamin D  50 MCG (2000 UT) tablet Take 2,000 Units by mouth daily.        Follow-up Information     [name], NP. Schedule an appointment as soon as possible for a visit in 1 week(s).   Specialties: Nurse Practitioner, Family Medicine Contact information: [address] [place] [address] [phone]         Lenn Aran, MD. Go in 1 week(s).   Specialty: Radiation Oncology Contact information: [address]   [address]   [place] [address] [phone]         Jacobo Evalene PARAS, MD .   Specialty: Oncology Contact information: [address] [place] [address] [phone]                Subjective   Pt reports no complaints. His head pain and dizziness has resolved. Was able to ambulate without shortness of breath.   All questions and concerns were addressed at time of discharge.  Objective  Blood pressure 105/80, pulse 80, temperature (!) 97.4 F (36.3 C), temperature source Oral, resp. rate 18, height 5' 11 (1.803 m), weight 68.6 kg, SpO2 99%.   General: Pt is alert, awake, not in acute distress. Ecchymosis around eye is resolving.  Cardiovascular: RRR, S1/S2 +, no rubs, no gallops Respiratory: CTA bilaterally, no wheezing, no rhonchi Abdominal: Soft, NT, ND, bowel sounds + Extremities: no edema, no cyanosis  The results of significant diagnostics from this hospitalization (including imaging, microbiology, ancillary and laboratory) are listed below for reference.   Imaging studies: CT Cervical Spine Wo Contrast Result Date: 07/10/2024 EXAM: CT CERVICAL SPINE WITHOUT CONTRAST 07/10/2024 01:09:08 PM TECHNIQUE: CT of the cervical spine was performed without the administration of  intravenous contrast. Multiplanar reformatted images are provided for review. Automated exposure control, iterative reconstruction, and/or weight based adjustment of the mA/kV was utilized to reduce the radiation dose to as low as reasonably achievable. COMPARISON: CT cervical spine 06/15/2024. CLINICAL HISTORY: Neck trauma (Age >= 65y). FINDINGS: CERVICAL SPINE: BONES AND ALIGNMENT: No acute fracture or traumatic malalignment. Straightening/mild reversal of the normal cervical lordosis. Trace degenerative anterolisthesis of C4 on C5. DEGENERATIVE CHANGES: Advanced multilevel facet arthrosis, asymmetrically severe on the right at C2-C3, C3-C4, and C4-C5. Right-sided facet and partial interbody ankylosis at C4-C5. Advanced disc degeneration at C5-C6 and C6-C7. Advanced right neural foraminal stenosis at C3-C4, C4-C5, and C6-C7 due to uncovertebral and facet spurring. Potential moderate spinal stenosis at C6-C7. SOFT TISSUES: No prevertebral soft tissue swelling. IMPRESSION: 1. No acute cervical spine fracture or traumatic malalignment. 2. Advanced multilevel disc and facet degeneration. Electronically signed by: Dasie Hamburg MD 07/10/2024 01:54 PM EST RP Workstation: [id number]   CT Maxillofacial Wo Contrast Result Date: 07/10/2024 EXAM: CT OF THE FACE WITHOUT CONTRAST 07/10/2024 01:09:08 PM TECHNIQUE:  CT of the face was performed without the administration of intravenous contrast. Multiplanar reformatted images are provided for review. Automated exposure control, iterative reconstruction, and/or weight based adjustment of the mA/kV was utilized to reduce the radiation dose to as low as reasonably achievable. COMPARISON: CTA head and neck 08/18/2021. CLINICAL HISTORY: Facial trauma, blunt. FINDINGS: FACIAL BONES: Edentulous. No acute facial fracture. No mandibular dislocation. No suspicious bone lesion. ORBITS: Globes are intact. No acute traumatic injury. No inflammatory change. SINUSES AND MASTOIDS: Minimal  mucosal thickening in the paranasal sinuses. Clear mastoid air cells. SOFT TISSUES: No acute abnormality. IMPRESSION: 1. No acute facial fracture. Electronically signed by: Dasie Hamburg MD 07/10/2024 01:49 PM EST RP Workstation: [id number]   CT HEAD WO CONTRAST ( ) Result Date: 07/10/2024 EXAM: CT HEAD WITHOUT CONTRAST 07/10/2024 01:09:08 PM TECHNIQUE: CT of the head was performed without the administration of intravenous contrast. Automated exposure control, iterative reconstruction, and/or weight based adjustment of the mA/kV was utilized to reduce the radiation dose to as low as reasonably achievable. COMPARISON: Head CT and MRI 06/15/2024. CLINICAL HISTORY: Head trauma, minor (Age >= 65y). FINDINGS: BRAIN AND VENTRICLES: No acute infarct, intracranial hemorrhage, midline shift, hydrocephalus, or extra-axial fluid collection is identified. A moderate sized chronic right MCA infarct is again noted with associated ex vacuo dilatation of the right lateral ventricle. Sequelae of suboccipital craniectomy are again identified with encephalomalacia in the inferior left cerebellar hemisphere. Multiple extra-axial/dural based masses in the posterior fossa were better demonstrated by the prior MRI. The most conspicuous lesions on this noncontrast CT are noted at the level of the tentorial incisura and torcula. Associated mild edema in the inferomedial left parietal lobe and right cerebellar hemisphere has mildly increased. There is no cerebral atrophy. Calcified atherosclerosis at the skull base. ORBITS AND SINUSES : The orbits and sinuses are reported separately on the dedicated maxillofacial CT. SOFT TISSUES AND SKULL: No acute soft tissue abnormality. No skull fracture. IMPRESSION: 1. No evidence of acute intracranial injury. 2. Known intracranial metastatic disease with slightly increased, mild edema in the left parietal lobe and right cerebellar hemisphere. No significant mass effect. 3. Chronic right MCA infarct.  Electronically signed by: Dasie Hamburg MD 07/10/2024 01:47 PM EST RP Workstation: [id number]   DG Chest Portable 1 View Result Date: 07/10/2024 CLINICAL DATA:  Cough. EXAM: PORTABLE CHEST 1 VIEW COMPARISON:  06/26/2023 and PET-CT 06/26/2024 FINDINGS: Left jugular Port-A-Cath with the tip near the superior cavoatrial junction. Lucency in the upper lungs compatible with known emphysema. Both lungs are clear. Right paratracheal density is compatible with known right lung mass. This mass has significantly decreased in size compared to 2024. IMPRESSION: 1. No acute cardiopulmonary disease. 2. Emphysema. Electronically Signed   By: Juliene Balder M.D.   On: 07/10/2024 13:12   NM PET Image Restag (PS) Skull Base To Thigh Result Date: 06/27/2024 CLINICAL DATA:  Subsequent treatment strategy for lung cancer. EXAM: NUCLEAR MEDICINE PET SKULL BASE TO THIGH TECHNIQUE: 8.0 mCi F-18 FDG was injected intravenously. Full-ring PET imaging was performed from the skull base to thigh after the radiotracer. CT data was obtained and used for attenuation correction and anatomic localization. Fasting blood glucose: 117 mg/dl COMPARISON:  CT chest abdomen pelvis 01/18/2024 and PET 08/17/2023. FINDINGS: Mediastinal blood pool activity: SUV max 2.2 Liver activity: SUV max NA NECK: Hypermetabolic lesions in the posterior fossa with index lesion anterior to the brainstem, SUV max 14.1. No additional abnormal hypermetabolism. Incidental CT findings: Right temporal encephalomalacia.  Cerebral atrophy. CHEST: Right lower  lobe mass straddles the upper right major fissure measures 3.2 x 4.2 cm, has decreased in size from 3.6 x 4.5 cm with minimal residual focal hypermetabolism along the medial margin, SUV max 3.6. Minimally hypermetabolic low right paratracheal lymph node measures 8 mm (SUV max 2.7. No additional abnormal hypermetabolism. Incidental CT findings: Left IJ Port-A-Cath terminates in the right atrium. Atherosclerotic calcification of  the aorta and coronary arteries. Trace pericardial effusion. Centrilobular and paraseptal emphysema. ABDOMEN/PELVIS: No abnormal hypermetabolism. Incidental CT findings: Gallstones. SKELETON: No abnormal hypermetabolism. Incidental CT findings: Postoperative changes in the lumbar spine. Degenerative changes in the spine. IMPRESSION: 1. New hypermetabolic brain metastases, as on MR brain 06/15/2024. 2. Right lower lobe mass has decreased slightly in size in the interval with only a tiny focal area of residual hypermetabolism along the medial margin. Small mildly hypermetabolic low right paratracheal lymph node. 3. Trace pericardial effusion. 4. Cholelithiasis. 5. Aortic atherosclerosis (ICD10-I70.0). Coronary artery calcification. 6.  Emphysema (ICD10-J43.9). Electronically Signed   By: Newell Eke M.D.   On: 06/27/2024 16:48   MR Brain W and Wo Contrast Result Date: 06/15/2024 EXAM: MRI BRAIN WITH AND WITHOUT CONTRAST 06/15/2024 07:57:31 PM TECHNIQUE: Multiplanar multisequence MRI of the head/brain was performed with and without the administration of intravenous contrast. COMPARISON: MRI head 02/21/2024. Same day CT head. CLINICAL HISTORY: Abnormal CT head, history of prior brain tumor s/p resection. FINDINGS: BRAIN AND VENTRICLES: Multiple new leptomeningeal enhancing masses in the posterior fossa. This includes : 2.5 x 1.9 cm mass along the anterior aspect of the left posterolateral leaflet at the tentorial incisura (series 20 image 13). 1 cm enhancing mass along the posterior midline cerebellum (series 18, image 54 and series 20, image 13 ). Adjacent edema in left occipital lobe and superior cerebllum. 1 cm enhancing mass along the posterior right cerebellum (series 18 image 47). 1.7 cm enhancing mass along the inferior and anterior right cerebellum (series 18, image 29). Adjcacent cerebellar edema. 1.7 cm enhancing mass along the anterior left paramidline basal cistern anterior to the basilar artery  (series 18, image 39). Adjacent cerebellar edema. No acute infarct. Remote right frontal infarct. No acute intracranial hemorrhage.  No hydrocephalus. Normal flow voids. ORBITS: No acute abnormality. SINUSES: No acute abnormality. BONES AND SOFT TISSUES: Left suboccipital craniotomy. IMPRESSION: 1. Multiple new leptomeningeal enhancing masses in the posterior fossa, detailed above and concerning for metastatic deposits given the history. 2. Adjacent brain edema in the cerebellum and occipital lobe without significant mass effect. Electronically signed by: Gilmore Molt MD 06/15/2024 08:42 PM EDT RP Workstation: [id number]   CT Cervical Spine Wo Contrast Result Date: 06/15/2024 CLINICAL DATA:  Fall EXAM: CT CERVICAL SPINE WITHOUT CONTRAST TECHNIQUE: Multidetector CT imaging of the cervical spine was performed without intravenous contrast. Multiplanar CT image reconstructions were also generated. RADIATION DOSE REDUCTION: This exam was performed according to the departmental dose-optimization program which includes automated exposure control, adjustment of the mA and/or kV according to patient size and/or use of iterative reconstruction technique. COMPARISON:  None Available. FINDINGS: Alignment: Normal. Skull base and vertebrae: No acute fracture. No primary bone lesion or focal pathologic process. Soft tissues and spinal canal: No prevertebral fluid or swelling. No visible canal hematoma. Disc levels: Disc space narrowing and spurring most notable at C5-6 and C6-7. Moderate to advanced bilateral degenerative facet disease, right greater than left. Multilevel bilateral neural foraminal narrowing, right greater than left. Upper chest: Emphysema. Other: None IMPRESSION: Multilevel degenerative disc and facet disease. No acute bony abnormality. Electronically Signed  By: Franky Crease M.D.   On: 06/15/2024 18:22   CT Head Wo Contrast Result Date: 06/15/2024 CLINICAL DATA:  Fall, headache EXAM: CT HEAD WITHOUT  CONTRAST TECHNIQUE: Contiguous axial images were obtained from the base of the skull through the vertex without intravenous contrast. RADIATION DOSE REDUCTION: This exam was performed according to the departmental dose-optimization program which includes automated exposure control, adjustment of the mA and/or kV according to patient size and/or use of iterative reconstruction technique. COMPARISON:  01/18/2024 FINDINGS: Brain: Encephalomalacia noted in the inferior left cerebellum with overlying craniotomy defect. This is in the area of prior cerebellar mass, now resected. Stable encephalomalacia in the right MCA territory. Mild atrophy. No acute infarct or hemorrhage. Slightly hyperdense mass now noted to the left of midline measuring 1.9 x 1 cm on image 17. this appears to be dural-based along the undersurface of the left tentorium. Also, in the adjacent medial left occipital lobe, there is subcortical edema concerning for possible mass lesion. Vascular: No hyperdense vessel or unexpected calcification. Skull: No acute calvarial abnormality. Prior left lower occipital craniotomy. Sinuses/Orbits: No acute findings Other: None IMPRESSION: 1.9 x 1 cm slightly hyperdense dural-based mass noted along the undersurface of the left tentorium near the midline. There also appears to be subcortical edema in the medial left occipital lobe concerning for underlying mass lesion. Recommend further evaluation with MRI with and without contrast. Electronically Signed   By: Franky Crease M.D.   On: 06/15/2024 18:20   DG Shoulder Left Result Date: 06/15/2024 CLINICAL DATA:  Fall, left shoulder pain EXAM: LEFT SHOULDER - 2+ VIEW COMPARISON:  None Available. FINDINGS: Degenerative changes in the [facility] joint with joint space narrowing and spurring. Glenohumeral joint is maintained. No acute bony abnormality. Specifically, no fracture, subluxation, or dislocation. Soft tissues are intact. IMPRESSION: Degenerative changes in the left AC  joint. No acute bony abnormality. Electronically Signed   By: Franky Crease M.D.   On: 06/15/2024 18:11    Labs: Basic Metabolic Panel: Recent Labs  Lab 07/10/24 1146  NA 140  K 3.9  CL 102  CO2 26  GLUCOSE 89  BUN 28*  CREATININE 0.99  CALCIUM  9.8   CBC: Recent Labs  Lab 07/10/24 1146  WBC 9.0  NEUTROABS 6.7  HGB 14.9  HCT 45.1  MCV 99.1  PLT 188   Microbiology: Results for orders placed or performed during the hospital encounter of 07/10/24  Resp panel by RT-PCR (RSV, Flu A&B, Covid) Anterior Nasal Swab     Status: None   Collection Time: 07/10/24 12:23 PM   Specimen: Anterior Nasal Swab  Result Value Ref Range Status   SARS Coronavirus 2 by RT PCR NEGATIVE NEGATIVE Final    Comment: (NOTE) SARS-CoV-2 target nucleic acids are NOT DETECTED.  The SARS-CoV-2 RNA is generally detectable in upper respiratory specimens during the acute phase of infection. The lowest concentration of SARS-CoV-2 viral copies this assay can detect is 138 copies/mL. A negative result does not preclude SARS-Cov-2 infection and should not be used as the sole basis for treatment or other patient management decisions. A negative result may occur with  improper specimen collection/handling, submission of specimen other than nasopharyngeal swab, presence of viral mutation(s) within the areas targeted by this assay, and inadequate number of viral copies(<138 copies/mL). A negative result must be combined with clinical observations, patient history, and epidemiological information. The expected result is Negative.  Fact Sheet for Patients:  bloggercourse.com  Fact Sheet for Healthcare Providers:  seriousbroker.it  This test is no t yet approved or cleared by the [place]  FDA and  has been authorized for detection and/or diagnosis of SARS-CoV-2 by FDA under an Emergency Use Authorization (EUA). This EUA will remain  in effect (meaning  this test can be used) for the duration of the COVID-19 declaration under Section 564(b)(1) of the Act, 21 U.S.C.section 360bbb-3(b)(1), unless the authorization is terminated  or revoked sooner.       Influenza A by PCR NEGATIVE NEGATIVE Final   Influenza B by PCR NEGATIVE NEGATIVE Final    Comment: (NOTE) The Xpert Xpress SARS-CoV-2/FLU/RSV plus assay is intended as an aid in the diagnosis of influenza from Nasopharyngeal swab specimens and should not be used as a sole basis for treatment. Nasal washings and aspirates are unacceptable for Xpert Xpress SARS-CoV-2/FLU/RSV testing.  Fact Sheet for Patients: bloggercourse.com  Fact Sheet for Healthcare Providers: seriousbroker.it  This test is not yet approved or cleared by the [place]  FDA and has been authorized for detection and/or diagnosis of SARS-CoV-2 by FDA under an Emergency Use Authorization (EUA). This EUA will remain in effect (meaning this test can be used) for the duration of the COVID-19 declaration under Section 564(b)(1) of the Act, 21 U.S.C. section 360bbb-3(b)(1), unless the authorization is terminated or revoked.     Resp Syncytial Virus by PCR NEGATIVE NEGATIVE Final    Comment: (NOTE) Fact Sheet for Patients: bloggercourse.com  Fact Sheet for Healthcare Providers: seriousbroker.it  This test is not yet approved or cleared by the [place]  FDA and has been authorized for detection and/or diagnosis of SARS-CoV-2 by FDA under an Emergency Use Authorization (EUA). This EUA will remain in effect (meaning this test can be used) for the duration of the COVID-19 declaration under Section 564(b)(1) of the Act, 21 U.S.C. section 360bbb-3(b)(1), unless the authorization is terminated or revoked.  Performed at [facility], [address] [place], [place] [address]   Respiratory (~20  pathogens) panel by PCR     Status: None   Collection Time: 07/11/24  3:15 AM   Specimen: Nasopharyngeal Swab; Respiratory  Result Value Ref Range Status   Adenovirus NOT DETECTED NOT DETECTED Final   Coronavirus 229E NOT DETECTED NOT DETECTED Final    Comment: (NOTE) The Coronavirus on the Respiratory Panel, DOES NOT test for the novel  Coronavirus (2019 nCoV)    Coronavirus HKU1 NOT DETECTED NOT DETECTED Final   Coronavirus NL63 NOT DETECTED NOT DETECTED Final   Coronavirus OC43 NOT DETECTED NOT DETECTED Final   Metapneumovirus NOT DETECTED NOT DETECTED Final   Rhinovirus / Enterovirus NOT DETECTED NOT DETECTED Final   Influenza A NOT DETECTED NOT DETECTED Final   Influenza B NOT DETECTED NOT DETECTED Final   Parainfluenza Virus 1 NOT DETECTED NOT DETECTED Final   Parainfluenza Virus 2 NOT DETECTED NOT DETECTED Final   Parainfluenza Virus 3 NOT DETECTED NOT DETECTED Final   Parainfluenza Virus 4 NOT DETECTED NOT DETECTED Final   Respiratory Syncytial Virus NOT DETECTED NOT DETECTED Final   Bordetella pertussis NOT DETECTED NOT DETECTED Final   Bordetella Parapertussis NOT DETECTED NOT DETECTED Final   Chlamydophila pneumoniae NOT DETECTED NOT DETECTED Final   Mycoplasma pneumoniae NOT DETECTED NOT DETECTED Final    Comment: Performed at [facility], [address] [place], [place] [address]    Time coordinating discharge: Over 30 minutes  Marien LITTIE Piety, MD  [facility] 07/12/2024, 10:44 AM

## 2024-07-12 NOTE — Progress Notes (Signed)
 Pt stated to nurse that he wanted to leave with his nephew that is currently in room.  Nephew said he has no problem driving pt to house.  Pt has been walking around unit today with no issues.  Nurse contacted CM and asked if they can cancel transport.  CM canceled transport and pt to leave with nephew.

## 2024-07-13 ENCOUNTER — Ambulatory Visit

## 2024-07-16 ENCOUNTER — Ambulatory Visit

## 2024-07-16 ENCOUNTER — Other Ambulatory Visit: Payer: Self-pay | Admitting: Oncology

## 2024-07-16 ENCOUNTER — Other Ambulatory Visit

## 2024-07-16 ENCOUNTER — Telehealth: Payer: Self-pay | Admitting: Radiation Oncology

## 2024-07-16 NOTE — Telephone Encounter (Signed)
 Patients daughter called to cancel all appointments due to patient being admitted to Hospice.

## 2024-07-17 ENCOUNTER — Ambulatory Visit

## 2024-07-18 ENCOUNTER — Ambulatory Visit

## 2024-07-19 ENCOUNTER — Ambulatory Visit

## 2024-07-20 ENCOUNTER — Ambulatory Visit

## 2024-07-30 ENCOUNTER — Ambulatory Visit: Admitting: Radiation Oncology

## 2024-08-08 ENCOUNTER — Ambulatory Visit: Admitting: Radiation Oncology

## 2024-08-13 ENCOUNTER — Ambulatory Visit: Admitting: Radiation Oncology

## 2024-08-28 ENCOUNTER — Other Ambulatory Visit

## 2024-08-28 ENCOUNTER — Inpatient Hospital Stay

## 2024-09-03 ENCOUNTER — Inpatient Hospital Stay: Admitting: Oncology

## 2024-09-06 DEATH — deceased

## 2024-09-10 ENCOUNTER — Inpatient Hospital Stay: Admitting: Hospice and Palliative Medicine

## 2024-09-23 ENCOUNTER — Other Ambulatory Visit: Payer: Self-pay | Admitting: Nurse Practitioner

## 2024-09-23 DIAGNOSIS — I951 Orthostatic hypotension: Secondary | ICD-10-CM

## 2024-10-17 ENCOUNTER — Ambulatory Visit: Admitting: Nurse Practitioner

## 2025-02-04 ENCOUNTER — Ambulatory Visit

## 2025-02-07 ENCOUNTER — Ambulatory Visit
